# Patient Record
Sex: Male | Born: 1940
Health system: Southern US, Community
[De-identification: ages and names within clinical notes are randomized; demographics above are authoritative.]

## PROBLEM LIST (undated history)

## (undated) DIAGNOSIS — I10 Essential (primary) hypertension: Secondary | ICD-10-CM

## (undated) DIAGNOSIS — K5730 Diverticulosis of large intestine without perforation or abscess without bleeding: Secondary | ICD-10-CM

## (undated) DIAGNOSIS — N400 Enlarged prostate without lower urinary tract symptoms: Secondary | ICD-10-CM

## (undated) DIAGNOSIS — E785 Hyperlipidemia, unspecified: Secondary | ICD-10-CM

## (undated) DIAGNOSIS — T7840XA Allergy, unspecified, initial encounter: Secondary | ICD-10-CM

## (undated) DIAGNOSIS — M1990 Unspecified osteoarthritis, unspecified site: Secondary | ICD-10-CM

## (undated) DIAGNOSIS — I2510 Atherosclerotic heart disease of native coronary artery without angina pectoris: Secondary | ICD-10-CM

## (undated) DIAGNOSIS — H269 Unspecified cataract: Secondary | ICD-10-CM

## (undated) DIAGNOSIS — I252 Old myocardial infarction: Secondary | ICD-10-CM

## (undated) DIAGNOSIS — Z86010 Personal history of colonic polyps: Secondary | ICD-10-CM

## (undated) HISTORY — DX: Old myocardial infarction: I25.2

## (undated) HISTORY — DX: Atherosclerotic heart disease of native coronary artery without angina pectoris: I25.10

## (undated) HISTORY — PX: PTCA: SHX146

## (undated) HISTORY — DX: Personal history of colonic polyps: Z86.010

## (undated) HISTORY — PX: CATARACT EXTRACTION: SUR2

## (undated) HISTORY — PX: EYE SURGERY: SHX253

## (undated) HISTORY — DX: Benign prostatic hyperplasia without lower urinary tract symptoms: N40.0

## (undated) HISTORY — DX: Unspecified cataract: H26.9

## (undated) HISTORY — DX: Allergy, unspecified, initial encounter: T78.40XA

## (undated) HISTORY — DX: Hyperlipidemia, unspecified: E78.5

## (undated) HISTORY — PX: COLONOSCOPY: SHX174

## (undated) HISTORY — DX: Essential (primary) hypertension: I10

## (undated) HISTORY — DX: Diverticulosis of large intestine without perforation or abscess without bleeding: K57.30

---

## 2000-04-12 ENCOUNTER — Encounter: Payer: Self-pay | Admitting: Cardiology

## 2000-04-12 ENCOUNTER — Ambulatory Visit (HOSPITAL_COMMUNITY): Admission: RE | Admit: 2000-04-12 | Discharge: 2000-04-12 | Payer: Self-pay | Admitting: Cardiology

## 2000-07-07 ENCOUNTER — Encounter: Payer: Self-pay | Admitting: Internal Medicine

## 2004-01-26 LAB — HM COLONOSCOPY

## 2004-02-19 ENCOUNTER — Ambulatory Visit: Payer: Self-pay | Admitting: Internal Medicine

## 2004-03-12 ENCOUNTER — Encounter: Payer: Self-pay | Admitting: Internal Medicine

## 2004-03-12 ENCOUNTER — Ambulatory Visit: Payer: Self-pay | Admitting: Internal Medicine

## 2005-08-04 ENCOUNTER — Ambulatory Visit: Payer: Self-pay | Admitting: Internal Medicine

## 2005-08-11 ENCOUNTER — Encounter: Payer: Self-pay | Admitting: Cardiovascular Disease

## 2005-08-11 ENCOUNTER — Ambulatory Visit: Payer: Self-pay

## 2005-08-16 ENCOUNTER — Ambulatory Visit: Payer: Self-pay | Admitting: Cardiology

## 2006-07-20 ENCOUNTER — Ambulatory Visit: Payer: Self-pay | Admitting: Internal Medicine

## 2006-07-20 DIAGNOSIS — I2510 Atherosclerotic heart disease of native coronary artery without angina pectoris: Secondary | ICD-10-CM

## 2006-07-20 DIAGNOSIS — R351 Nocturia: Secondary | ICD-10-CM

## 2006-07-20 DIAGNOSIS — E785 Hyperlipidemia, unspecified: Secondary | ICD-10-CM

## 2006-07-20 DIAGNOSIS — I252 Old myocardial infarction: Secondary | ICD-10-CM

## 2006-07-20 DIAGNOSIS — N401 Enlarged prostate with lower urinary tract symptoms: Secondary | ICD-10-CM

## 2006-07-20 HISTORY — DX: Old myocardial infarction: I25.2

## 2006-07-20 LAB — CONVERTED CEMR LAB
ALT: 38 units/L (ref 0–53)
AST: 47 units/L — ABNORMAL HIGH (ref 0–37)
Albumin: 4.1 g/dL (ref 3.5–5.2)
Alkaline Phosphatase: 46 units/L (ref 39–117)
BUN: 16 mg/dL (ref 6–23)
Basophils Absolute: 0 10*3/uL (ref 0.0–0.1)
Basophils Relative: 0.3 % (ref 0.0–1.0)
Bilirubin, Direct: 0.1 mg/dL (ref 0.0–0.3)
CO2: 32 meq/L (ref 19–32)
Calcium: 9.4 mg/dL (ref 8.4–10.5)
Chloride: 101 meq/L (ref 96–112)
Cholesterol: 109 mg/dL (ref 0–200)
Creatinine, Ser: 1.2 mg/dL (ref 0.4–1.5)
Eosinophils Absolute: 0.3 10*3/uL (ref 0.0–0.6)
Eosinophils Relative: 5.2 % — ABNORMAL HIGH (ref 0.0–5.0)
GFR calc Af Amer: 77 mL/min
GFR calc non Af Amer: 64 mL/min
Glucose, Bld: 94 mg/dL (ref 70–99)
HCT: 46.6 % (ref 39.0–52.0)
HDL: 41.8 mg/dL (ref >39.0)
Hemoglobin: 15.4 g/dL (ref 13.0–17.0)
LDL Cholesterol: 56 mg/dL (ref 0–99)
Lymphocytes Relative: 31.8 % (ref 12.0–46.0)
MCHC: 33 g/dL (ref 30.0–36.0)
MCV: 88.7 fL (ref 78.0–100.0)
Monocytes Absolute: 0.9 10*3/uL — ABNORMAL HIGH (ref 0.2–0.7)
Monocytes Relative: 13.4 % — ABNORMAL HIGH (ref 3.0–11.0)
Neutro Abs: 3.2 10*3/uL (ref 1.4–7.7)
Neutrophils Relative %: 49.3 % (ref 43.0–77.0)
PSA: 3.47 ng/mL (ref 0.10–4.00)
Platelets: 234 10*3/uL (ref 150–400)
Potassium: 4.5 meq/L (ref 3.5–5.1)
RBC: 5.26 M/uL (ref 4.22–5.81)
RDW: 12.5 % (ref 11.5–14.6)
Sodium: 142 meq/L (ref 135–145)
TSH: 1.48 microintl units/mL (ref 0.35–5.50)
Total Bilirubin: 1.3 mg/dL — ABNORMAL HIGH (ref 0.3–1.2)
Total CHOL/HDL Ratio: 2.6
Total Protein: 7.3 g/dL (ref 6.0–8.3)
Triglycerides: 55 mg/dL (ref 0–149)
VLDL: 11 mg/dL (ref 0–40)
WBC: 6.4 10*3/uL (ref 4.5–10.5)

## 2006-08-31 ENCOUNTER — Encounter: Payer: Self-pay | Admitting: Internal Medicine

## 2007-07-25 ENCOUNTER — Ambulatory Visit: Payer: Self-pay | Admitting: Internal Medicine

## 2007-07-25 DIAGNOSIS — Q828 Other specified congenital malformations of skin: Secondary | ICD-10-CM

## 2007-07-25 LAB — CONVERTED CEMR LAB
ALT: 25 units/L (ref 0–53)
AST: 32 units/L (ref 0–37)
Albumin: 4.1 g/dL (ref 3.5–5.2)
Alkaline Phosphatase: 43 units/L (ref 39–117)
BUN: 13 mg/dL (ref 6–23)
Basophils Absolute: 0 10*3/uL (ref 0.0–0.1)
Basophils Relative: 0.5 % (ref 0.0–1.0)
Bilirubin, Direct: 0.1 mg/dL (ref 0.0–0.3)
CO2: 31 meq/L (ref 19–32)
Calcium: 9.4 mg/dL (ref 8.4–10.5)
Chloride: 105 meq/L (ref 96–112)
Cholesterol: 143 mg/dL (ref 0–200)
Creatinine, Ser: 1.1 mg/dL (ref 0.4–1.5)
Eosinophils Absolute: 0.2 10*3/uL (ref 0.0–0.7)
Eosinophils Relative: 3.6 % (ref 0.0–5.0)
GFR calc Af Amer: 85 mL/min
GFR calc non Af Amer: 71 mL/min
Glucose, Bld: 103 mg/dL — ABNORMAL HIGH (ref 70–99)
HCT: 43.8 % (ref 39.0–52.0)
HDL: 57.6 mg/dL (ref >39.0)
Hemoglobin: 15 g/dL (ref 13.0–17.0)
LDL Cholesterol: 71 mg/dL (ref 0–99)
Lymphocytes Relative: 30.4 % (ref 12.0–46.0)
MCHC: 34.4 g/dL (ref 30.0–36.0)
MCV: 89.2 fL (ref 78.0–100.0)
Monocytes Absolute: 0.6 10*3/uL (ref 0.1–1.0)
Monocytes Relative: 9.2 % (ref 3.0–12.0)
Neutro Abs: 3.4 10*3/uL (ref 1.4–7.7)
Neutrophils Relative %: 56.3 % (ref 43.0–77.0)
PSA: 3.17 ng/mL (ref 0.10–4.00)
Platelets: 207 10*3/uL (ref 150–400)
Potassium: 5.4 meq/L — ABNORMAL HIGH (ref 3.5–5.1)
RBC: 4.91 M/uL (ref 4.22–5.81)
RDW: 12.9 % (ref 11.5–14.6)
Sodium: 140 meq/L (ref 135–145)
TSH: 1.55 microintl units/mL (ref 0.35–5.50)
Total Bilirubin: 1.1 mg/dL (ref 0.3–1.2)
Total CHOL/HDL Ratio: 2.5
Total Protein: 7.2 g/dL (ref 6.0–8.3)
Triglycerides: 74 mg/dL (ref 0–149)
VLDL: 15 mg/dL (ref 0–40)
WBC: 6.1 10*3/uL (ref 4.5–10.5)

## 2007-07-31 ENCOUNTER — Encounter: Admission: RE | Admit: 2007-07-31 | Discharge: 2007-09-06 | Payer: Self-pay | Admitting: Internal Medicine

## 2007-07-31 ENCOUNTER — Encounter: Payer: Self-pay | Admitting: Internal Medicine

## 2008-03-05 ENCOUNTER — Telehealth: Payer: Self-pay | Admitting: Internal Medicine

## 2008-04-01 ENCOUNTER — Telehealth: Payer: Self-pay | Admitting: Internal Medicine

## 2008-05-24 ENCOUNTER — Telehealth: Payer: Self-pay | Admitting: Internal Medicine

## 2008-07-24 ENCOUNTER — Ambulatory Visit: Payer: Self-pay | Admitting: Internal Medicine

## 2008-07-24 LAB — CONVERTED CEMR LAB
ALT: 22 units/L (ref 0–53)
AST: 33 units/L (ref 0–37)
Albumin: 3.9 g/dL (ref 3.5–5.2)
Alkaline Phosphatase: 48 units/L (ref 39–117)
BUN: 15 mg/dL (ref 6–23)
Basophils Absolute: 0 10*3/uL (ref 0.0–0.1)
Basophils Relative: 0 % (ref 0.0–3.0)
Bilirubin Urine: NEGATIVE
Bilirubin, Direct: 0.2 mg/dL (ref 0.0–0.3)
CO2: 29 meq/L (ref 19–32)
Calcium: 9 mg/dL (ref 8.4–10.5)
Chloride: 107 meq/L (ref 96–112)
Cholesterol: 133 mg/dL (ref 0–200)
Creatinine, Ser: 1.1 mg/dL (ref 0.4–1.5)
Eosinophils Absolute: 0.2 10*3/uL (ref 0.0–0.7)
Eosinophils Relative: 4.2 % (ref 0.0–5.0)
GFR calc non Af Amer: 70.33 mL/min (ref >60)
Glucose, Bld: 111 mg/dL — ABNORMAL HIGH (ref 70–99)
Glucose, Urine, Semiquant: NEGATIVE
HCT: 41.5 % (ref 39.0–52.0)
HDL: 57.7 mg/dL (ref >39.00)
Hemoglobin: 14.3 g/dL (ref 13.0–17.0)
Ketones, urine, test strip: NEGATIVE
LDL Cholesterol: 64 mg/dL (ref 0–99)
Lymphocytes Relative: 35.8 % (ref 12.0–46.0)
Lymphs Abs: 2 10*3/uL (ref 0.7–4.0)
MCHC: 34.3 g/dL (ref 30.0–36.0)
MCV: 88.1 fL (ref 78.0–100.0)
Monocytes Absolute: 0.6 10*3/uL (ref 0.1–1.0)
Monocytes Relative: 11.3 % (ref 3.0–12.0)
Neutro Abs: 2.9 10*3/uL (ref 1.4–7.7)
Neutrophils Relative %: 48.7 % (ref 43.0–77.0)
Nitrite: NEGATIVE
PSA: 3.22 ng/mL (ref 0.10–4.00)
Platelets: 193 10*3/uL (ref 150.0–400.0)
Potassium: 4.2 meq/L (ref 3.5–5.1)
Protein, U semiquant: NEGATIVE
RBC: 4.71 M/uL (ref 4.22–5.81)
RDW: 12.5 % (ref 11.5–14.6)
Sodium: 141 meq/L (ref 135–145)
Specific Gravity, Urine: 1.015
TSH: 1.68 microintl units/mL (ref 0.35–5.50)
Total Bilirubin: 1.2 mg/dL (ref 0.3–1.2)
Total CHOL/HDL Ratio: 2
Total Protein: 6.9 g/dL (ref 6.0–8.3)
Triglycerides: 57 mg/dL (ref 0.0–149.0)
Urobilinogen, UA: 0.2
VLDL: 11.4 mg/dL (ref 0.0–40.0)
Vit D, 25-Hydroxy: 60 ng/mL (ref 30–89)
WBC Urine, dipstick: NEGATIVE
WBC: 5.7 10*3/uL (ref 4.5–10.5)
pH: 6

## 2008-08-02 ENCOUNTER — Ambulatory Visit: Payer: Self-pay | Admitting: Internal Medicine

## 2008-12-04 ENCOUNTER — Ambulatory Visit: Payer: Self-pay | Admitting: Internal Medicine

## 2009-05-05 ENCOUNTER — Encounter: Payer: Self-pay | Admitting: Internal Medicine

## 2009-07-30 ENCOUNTER — Ambulatory Visit: Payer: Self-pay | Admitting: Internal Medicine

## 2009-07-30 LAB — CONVERTED CEMR LAB
ALT: 23 units/L (ref 0–53)
AST: 30 units/L (ref 0–37)
Albumin: 4.2 g/dL (ref 3.5–5.2)
Alkaline Phosphatase: 48 units/L (ref 39–117)
BUN: 18 mg/dL (ref 6–23)
Basophils Absolute: 0 10*3/uL (ref 0.0–0.1)
Basophils Relative: 0.6 % (ref 0.0–3.0)
Bilirubin Urine: NEGATIVE
Bilirubin, Direct: 0.2 mg/dL (ref 0.0–0.3)
Blood in Urine, dipstick: NEGATIVE
CO2: 29 meq/L (ref 19–32)
Calcium: 9.1 mg/dL (ref 8.4–10.5)
Chloride: 106 meq/L (ref 96–112)
Cholesterol: 162 mg/dL (ref 0–200)
Creatinine, Ser: 1.1 mg/dL (ref 0.4–1.5)
Eosinophils Absolute: 0.3 10*3/uL (ref 0.0–0.7)
Eosinophils Relative: 4.4 % (ref 0.0–5.0)
GFR calc non Af Amer: 73.99 mL/min (ref >60)
Glucose, Bld: 90 mg/dL (ref 70–99)
Glucose, Urine, Semiquant: NEGATIVE
HCT: 43.6 % (ref 39.0–52.0)
HDL: 58.6 mg/dL (ref >39.00)
Hemoglobin: 15 g/dL (ref 13.0–17.0)
Ketones, urine, test strip: NEGATIVE
LDL Cholesterol: 86 mg/dL (ref 0–99)
Lymphocytes Relative: 36.2 % (ref 12.0–46.0)
Lymphs Abs: 2.5 10*3/uL (ref 0.7–4.0)
MCHC: 34.3 g/dL (ref 30.0–36.0)
MCV: 88.5 fL (ref 78.0–100.0)
Monocytes Absolute: 0.7 10*3/uL (ref 0.1–1.0)
Monocytes Relative: 10.4 % (ref 3.0–12.0)
Neutro Abs: 3.3 10*3/uL (ref 1.4–7.7)
Neutrophils Relative %: 48.4 % (ref 43.0–77.0)
Nitrite: NEGATIVE
PSA: 4.09 ng/mL — ABNORMAL HIGH (ref 0.10–4.00)
Platelets: 214 10*3/uL (ref 150.0–400.0)
Potassium: 4.4 meq/L (ref 3.5–5.1)
Protein, U semiquant: NEGATIVE
RBC: 4.93 M/uL (ref 4.22–5.81)
RDW: 13.8 % (ref 11.5–14.6)
Sodium: 141 meq/L (ref 135–145)
Specific Gravity, Urine: 1.02
TSH: 1.75 microintl units/mL (ref 0.35–5.50)
Total Bilirubin: 0.8 mg/dL (ref 0.3–1.2)
Total CHOL/HDL Ratio: 3
Total Protein: 7.1 g/dL (ref 6.0–8.3)
Triglycerides: 89 mg/dL (ref 0.0–149.0)
Urobilinogen, UA: 0.2
VLDL: 17.8 mg/dL (ref 0.0–40.0)
WBC Urine, dipstick: NEGATIVE
WBC: 6.9 10*3/uL (ref 4.5–10.5)
pH: 6.5

## 2009-08-05 ENCOUNTER — Ambulatory Visit: Payer: Self-pay | Admitting: Internal Medicine

## 2009-08-05 DIAGNOSIS — R972 Elevated prostate specific antigen [PSA]: Secondary | ICD-10-CM

## 2009-09-16 ENCOUNTER — Ambulatory Visit: Payer: Self-pay | Admitting: Internal Medicine

## 2009-09-24 LAB — CONVERTED CEMR LAB
PSA, Free Pct: 18 — ABNORMAL LOW (ref >25)
PSA, Free: 0.7 ng/mL
PSA: 3.82 ng/mL (ref 0.10–4.00)

## 2010-01-20 ENCOUNTER — Ambulatory Visit
Admission: RE | Admit: 2010-01-20 | Discharge: 2010-01-20 | Payer: Self-pay | Source: Home / Self Care | Attending: Internal Medicine | Admitting: Internal Medicine

## 2010-02-24 NOTE — Assessment & Plan Note (Signed)
Summary: emp/pt fasting/cjr   Vital Signs:  Patient profile:   69 year old male Height:      73 inches (185.42 cm) Weight:      194 pounds (88.18 kg) BMI:     25.69 Temp:     97.9 degrees F (36.61 degrees C) oral Pulse rate:   72 / minute BP sitting:   164 / 88  (left arm) Cuff size:   regular  Vitals Entered By: Josph Macho RMA (August 05, 2009 8:55 AM) CC: Physical/ CF Is Patient Diabetic? No   CC:  Physical/ CF.  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 Penicillin V Potassium (Penicillin V Potassium)  Past History:  Past Medical History: Last updated: 07/20/2006 Coronary artery disease Hyperlipidemia Myocardial infarction, hx of Benign prostatic hypertrophy Diverticulosis, colon  Past Surgical History: Last updated: 07/25/2007 Percutaneous transluminal coronary angioplasty1998  Family History: Last updated: 07/20/2006 Father and paternal uncle with AAA Father deceased stroke-82 yo-also renal failure Mother-deceased 76 yo CABG age 78yo  Social History: Last updated: 07/20/2006 Retired- Never Smoked Alcohol use-yes Regular exercise-yes-eliptical, weight lifting.   Risk Factors: Alcohol Use: <1 (07/20/2006) Exercise: yes (07/20/2006)  Risk Factors: Smoking Status: never (07/20/2006)  Review of Systems       All other systems reviewed and were negative    Impression & Recommendations:  Problem # 1:  HEALTH SCREENING (ICD-V70.0) health maint  utd encouraged continued physical activity  Complete Medication List: 1)  Simvastatin 40 Mg Tabs (Simvastatin) .... Take 1/2 tablet by mouth at bedtime 2)  Aspirin 81 Mg Tbec (Aspirin) .... Once daily 3)  Multivitamins Tabs (Multiple vitamin) .... Once daily 4)  Saw Palmetto 450 Mg Caps (Saw palmetto (serenoa repens)) .... Two daily 5)  Vitamin D3 2000 Unit Caps (Cholecalciferol) .... Two times a day 6)  Move Free  .... Once daily 7)  Nitroglycerin 0.3 Mg Subl (Nitroglycerin) .Marland Kitchen.. 1 sublingual q as needed 8)  Viagra 100 Mg Tabs (Sildenafil citrate) .... 1/2-1 by mouth 1 hour- 4 hours prior to intercourse , do not take nitroglycerin 9)  Eql Coq10 300 Mg Caps (Coenzyme q10) .... Once daily 10)  Reseratrol 250 Mg  .... Daily 11)  Fish Oil 1000 Mg Caps (Omega-3 fatty acids) .Marland Kitchen.. 1 daily  Patient Instructions: 1)  PSA and free psa in 6 weeks 790.93 2)  if we don't call you with results call us back after 4-5 days Physical Exam General Appearance: well developed, well nourished, no acute distress Eyes: conjunctiva and lids normal, PERRLA, EOMI, Ears, Nose, Mouth, Throat: TM clear, nares clear, oral exam WNL Neck: supple, no lymphadenopathy, no thyromegaly, no JVD Respiratory: clear to auscultation and percussion, respiratory effort normal Cardiovascular: regular rate and rhythm, S1-S2, no murmur, rub or gallop, no bruits, peripheral pulses normal and symmetric, no cyanosis, clubbing, edema or varicosities Chest: no scars, masses, tenderness; no asymmetry, skin changes, nipple discharge, no gynecomastia   Gastrointestinal: soft, non-tender; no hepatosplenomegaly, masses; active bowel sounds all quadrants,  Genitourinary: minimal  prostate enlargement Lymphatic:  no cervical, axillary or inguinal adenopathy Musculoskeletal: gait normal, muscle tone and strength WNL, no joint swelling, effusions, discoloration, crepitus  Skin: clear, good turgor, color WNL, no rashes, lesions, or  ulcerations Neurologic: normal mental status, normal reflexes, normal strength, sensation, and motion Psychiatric: alert; oriented to person, place and time Other Exam:

## 2010-02-24 NOTE — Letter (Signed)
Summary: Medical Clearance for Exercise Program  Medical Clearance for Exercise Program   Imported By: Maryln Gottron 05/06/2009 15:40:57  _____________________________________________________________________  External Attachment:    Type:   Image     Comment:   External Document

## 2010-02-26 NOTE — Assessment & Plan Note (Signed)
Summary: FLU SHOT//[facility]  Nurse Visit   Allergies: 1)  ! Penicillin V Potassium (Penicillin V Potassium)  Orders Added: 1)  Admin 1st Vaccine [90471] 2)  Flu Vaccine 65yrs + [62952] Flu Vaccine Consent Questions     Do you have a history of severe allergic reactions to this vaccine? no    Any prior history of allergic reactions to egg and/or gelatin? no    Do you have a sensitivity to the preservative Thimersol? no    Do you have a past history of Guillan-Barre Syndrome? no    Do you currently have an acute febrile illness? no    Have you ever had a severe reaction to latex? no    Vaccine information given and explained to patient? yes    Are you currently pregnant? no    Lot Number:[id number]   Exp Date:07/25/2010   Site Given  Left Deltoid IM-CCC]  .[email or web address]

## 2010-03-25 ENCOUNTER — Other Ambulatory Visit: Payer: PRIVATE HEALTH INSURANCE | Admitting: Internal Medicine

## 2010-03-25 ENCOUNTER — Other Ambulatory Visit: Payer: Self-pay | Admitting: Internal Medicine

## 2010-03-25 DIAGNOSIS — R972 Elevated prostate specific antigen [PSA]: Secondary | ICD-10-CM

## 2010-03-25 LAB — PSA, TOTAL AND FREE
PSA, Free Pct: 18 % — ABNORMAL LOW (ref >25)
PSA, Free: 0.7 ng/mL

## 2010-05-08 ENCOUNTER — Other Ambulatory Visit: Payer: Self-pay | Admitting: Internal Medicine

## 2010-06-12 NOTE — Assessment & Plan Note (Signed)
[facility]                              CARDIOLOGY OFFICE NOTE   NAME:Collin [name], Collin [name]                     MRN:          [id number]  DATE:08/04/2005                            DOB:          08/15/1940    SUBJECTIVE:  Mr. Kohlmeyer is a very pleasant 70 year old male patient  previously followed by Dr. Charlies Constable who was last seen in our office in  03/2000.  The patient has a history of inferior wall myocardial infarction  in 1998, treated with angioplasty to the RCA.  At that time his residual  disease included a 70% mid LAD and 80% stenosis in the diagonal branch of  the LAD, and he had slight inferobasal wall hypokinesis.  His EF was 60% at  that time.  The left circumflex was free of significant disease.  The  patient did have a follow-up Cardiolite study done on 07/14/1998 that showed  inferobasal scar and an EF of 54%.  He has been followed by Dr. Doran Clay at [facility] since that time.  He  has been doing well up until a week and a half to two weeks ago when he  noted some discomfort in his left chest.  This was at rest while he was  laying in bed.  It did not wake him up.  He describes it as a beginning,  quiver.  This was brief in nature and would come and go for about an hour.  The discomfort went away on its own.  Nothing made it any worse.  He has had  not had a recurrence of this symptom since then.  He is quite active.  He  works on the [employer] three times a week and walks three times a  week as well.  He has been doing his normal activity since this episode of  discomfort without symptoms.  He denies any shortness of breath, nausea,  diaphoresis, orthopnea, paroxysmal nocturnal dyspnea, syncope, presyncope.   CURRENT MEDICATIONS:  Include multivitamin, Vytorin 10/40 mg q.h.s., aspirin  81 mg daily, Glucosamine, and saw palmetto.   ALLERGIES:  NO KNOWN DRUG ALLERGIES.   SOCIAL HISTORY:   The patient denies any current tobacco abuse or alcohol  abuse.   FAMILY HISTORY:  Significant for coronary disease.  His mother had bypass in  her mid-to-late 79s.   REVIEW OF SYSTEMS:  Please see HPI.  He denies any fevers, chills, cough,  melena, hematochezia, hematuria, dysuria, claudication, amaurosis fugax , or  TIAs.  Rest of review of systems is negative.   PHYSICAL EXAMINATION:  He is a well-nourished, well-developed male.  Blood  pressure 140/88.  Pulse 64.  Weight 191 pounds.  Repeat blood pressure by me  is 132/80.  HEAD:  Normocephalic, atraumatic.  EYES:  PERRLA, EOMI.  Sclerae are clear.  OROPHARYNX:  Pink without exudate.  NECK:  Without lymphadenopathy. Without thyromegaly. Carotids are 2+  bilaterally without bruits.  CARDIAC:  Normal S1 and S2, regular rate and rhythm, without murmurs,  clicks, rubs, or gallops.  LUNGS:  Clear to auscultation  without wheezing, rhonchi, or rales.  ABDOMEN:  Soft and nontender with normoactive bowel sounds.  No  organomegaly.  EXTREMITIES:  Without edema.  Calves are soft and nontender.  Femoral artery  pulses are 2+ bilaterally without bruits.  Dorsalis pedis and posterior  tibialis pulses are 2+ bilaterally.  NEUROLOGIC:  He is alert and oriented x3.  Cranial nerves II through XII are  grossly intact.  No focal deficits.  SKIN:  Warm and dry.  ELECTROCARDIOGRAM reveals a sinus rhythm with a heart rate of 64.  Normal  axis.  No acute changes.   IMPRESSION:  1.  Chest pain.  2.  Coronary artery disease.      1.  Status post inferior wall myocardial infarction in 05/1996 treated          with balloon angioplasty.      2.  Residual disease as noted: 70% mid LAD and 80% diagonal.      3.  Nonischemic Cardiolite in 06/1998.  3.  Preserved LAD function.  4.  Treated dyslipidemia.  5.  Family history of coronary disease.   PLAN:  The patient has had a singular episode of atypical chest discomfort.  He is quite active for a  70 year old and has no symptoms of shortness of  breath or chest pain with his normal daily activities.  His EKG is  nonischemic at this point in time.  His recent lab work shows a good  cholesterol level with an LDL of 51 and an HDL of 59.  These findings were  done by Dr. Janey Greaser in 06/2005.  I have discussed the patient's care today  with Dr. Dietrich Pates.  We plan to proceed with noninvasive testing with a  stress Myoview study to further define his recent chest pain.  His  medications will continue the same at this point in time.  He has been  encouraged to stay on an aspirin.  He is a little concerned about his blood  pressure and we could consider adding a low-dose beta-blocker.  However, we  will wait until his Myoview study is completed.  Should he have recurrence  of symptoms or an abnormal Myoview study, then we will certainly need to  proceed with cardiac catheterization.  The patient will follow up with Dr.  Juanda Chance in the next two weeks.                                  Tereso Newcomer, PA-C    SW/MedQ  DD:  08/04/2005  DT:  08/04/2005  Job #:  [id number]   cc:   Al Decant. Janey Greaser, MD

## 2010-06-12 NOTE — Assessment & Plan Note (Signed)
[facility]                              CARDIOLOGY OFFICE NOTE   NAME:SAMPLETashon, Capp                     MRN:          [id number]  DATE:08/16/2005                            DOB:          15-Jul-1940    PRIMARY CARE PHYSICIAN:  Al Decant. Janey Greaser, M.D.   CLINICAL HISTORY:  Mr. Golembeski is 70 years old and returned for a followup  visit after his recent Myoview scan.  He had a diaphragmatic wall  infarction, treated with PTCA of the right coronary artery in 1998 (no  stent) and had residual 70% narrowing in the LAD.  He recently was seen by  Tereso Newcomer with symptoms of sharp, intermittent left chest pain.  The  symptoms were thought to be atypical, but he was evaluated with a Myoview  scan, which was negative for ischemia and showed an ejection fraction of  56%.   He has had no recurrence of his symptoms.   PAST MEDICAL HISTORY:  His past medical history is significant for  hyperlipidemia, but he had a recent lipid panel by Dr. Janey Greaser, which showed  an HDL of 59 and an LDL of 51 and a total cholesterol of 121.   CURRENT MEDICATIONS INCLUDE:  1.  Vytorin 1040.  2.  Aspirin.  3.  Glucosamine.   PHYSICAL EXAMINATION:  On examination today, the blood pressure was 129/74  and the pulse 56 and regular.  There was no venous distention.  Carotid  pulses were full, without bruits.  Chest was clear.  Cardiac rhythm was  regular.  There were no murmurs or gallops.  The abdomen was soft, without  organomegaly.  Peripheral pulses were full.  There was no peripheral edema.   IMPRESSION:  1.  Coronary artery disease, status post inferior wall myocardial infarction      in 1998, treated with PTCA with residual 70% narrowing of the LAD.  2.  Recent chest pain, felt to be non-ischemic with negative Cardiolite      scan.  3.  Good LV function.  4.  Treated for hyperlipidemia.  5.  Positive family history for coronary heart disease.   RECOMMENDATIONS:   Mr. Saiki recent symptoms were quite atypical and the  scan is negative, so I do not think his symptoms were cardiac in etiology.  His risk factors are under excellent control.  He is at a good body weight  and exercises regularly and his lipid panel is excellent and his blood  pressure is under good control.  I reassured him that I thought his heart  was in good shape and that he was doing an excellent job in modifying his  risk profile.  He will plan to see Dr. Janey Greaser in followup and we will see  him back here on a p.r.n. basis.                               Bruce Elvera Lennox Juanda Chance, MD, [facility]    BRB/MedQ  DD:  08/16/2005  DT:  08/16/2005  Job #:  [id number] 

## 2010-07-08 ENCOUNTER — Encounter: Payer: PRIVATE HEALTH INSURANCE | Admitting: Internal Medicine

## 2010-07-31 ENCOUNTER — Other Ambulatory Visit: Payer: PRIVATE HEALTH INSURANCE

## 2010-08-04 ENCOUNTER — Other Ambulatory Visit (INDEPENDENT_AMBULATORY_CARE_PROVIDER_SITE_OTHER): Payer: PRIVATE HEALTH INSURANCE

## 2010-08-04 DIAGNOSIS — E785 Hyperlipidemia, unspecified: Secondary | ICD-10-CM

## 2010-08-04 DIAGNOSIS — R972 Elevated prostate specific antigen [PSA]: Secondary | ICD-10-CM

## 2010-08-04 DIAGNOSIS — Z0000 Encounter for general adult medical examination without abnormal findings: Secondary | ICD-10-CM

## 2010-08-04 LAB — CBC WITH DIFFERENTIAL/PLATELET
Basophils Relative: 0.5 % (ref 0.0–3.0)
Eosinophils Relative: 4 % (ref 0.0–5.0)
Lymphocytes Relative: 37.7 % (ref 12.0–46.0)
MCV: 89.8 fl (ref 78.0–100.0)
Monocytes Absolute: 0.7 10*3/uL (ref 0.1–1.0)
Neutrophils Relative %: 47 % (ref 43.0–77.0)
RBC: 4.96 Mil/uL (ref 4.22–5.81)
WBC: 6.7 10*3/uL (ref 4.5–10.5)

## 2010-08-04 LAB — HEPATIC FUNCTION PANEL
ALT: 30 U/L (ref 0–53)
Alkaline Phosphatase: 54 U/L (ref 39–117)
Bilirubin, Direct: 0.2 mg/dL (ref 0.0–0.3)
Total Protein: 6.9 g/dL (ref 6.0–8.3)

## 2010-08-04 LAB — POCT URINALYSIS DIPSTICK
Blood, UA: NEGATIVE
Glucose, UA: NEGATIVE
Ketones, UA: NEGATIVE
Protein, UA: NEGATIVE
Spec Grav, UA: 1.015

## 2010-08-04 LAB — BASIC METABOLIC PANEL
Chloride: 105 mEq/L (ref 96–112)
Creatinine, Ser: 1.1 mg/dL (ref 0.4–1.5)
Sodium: 139 mEq/L (ref 135–145)

## 2010-08-04 LAB — PSA: PSA: 5.39 ng/mL — ABNORMAL HIGH (ref 0.10–4.00)

## 2010-08-04 LAB — TSH: TSH: 1.43 u[IU]/mL (ref 0.35–5.50)

## 2010-08-04 LAB — LIPID PANEL: Total CHOL/HDL Ratio: 3

## 2010-08-07 ENCOUNTER — Encounter: Payer: PRIVATE HEALTH INSURANCE | Admitting: Internal Medicine

## 2010-08-11 ENCOUNTER — Other Ambulatory Visit: Payer: Self-pay | Admitting: Internal Medicine

## 2010-08-11 DIAGNOSIS — E875 Hyperkalemia: Secondary | ICD-10-CM

## 2010-08-11 MED ORDER — CIPROFLOXACIN HCL 500 MG PO TABS: 500.0000 mg | ORAL_TABLET | Freq: Two times a day (BID) | ORAL | Status: AC

## 2010-08-12 ENCOUNTER — Other Ambulatory Visit (INDEPENDENT_AMBULATORY_CARE_PROVIDER_SITE_OTHER): Payer: PRIVATE HEALTH INSURANCE

## 2010-08-12 ENCOUNTER — Telehealth: Payer: Self-pay

## 2010-08-12 DIAGNOSIS — E875 Hyperkalemia: Secondary | ICD-10-CM

## 2010-08-12 LAB — BASIC METABOLIC PANEL
CO2: 31 mEq/L (ref 19–32)
Chloride: 105 mEq/L (ref 96–112)
Potassium: 4.4 mEq/L (ref 3.5–5.1)

## 2010-08-12 NOTE — Telephone Encounter (Signed)
Pt requesting copy of labs and PSA done in February.  Pt notified labs mailed

## 2010-09-04 ENCOUNTER — Other Ambulatory Visit (INDEPENDENT_AMBULATORY_CARE_PROVIDER_SITE_OTHER): Payer: PRIVATE HEALTH INSURANCE

## 2010-09-04 ENCOUNTER — Other Ambulatory Visit: Payer: PRIVATE HEALTH INSURANCE

## 2010-09-04 DIAGNOSIS — R972 Elevated prostate specific antigen [PSA]: Secondary | ICD-10-CM

## 2010-09-05 LAB — PSA, TOTAL AND FREE
PSA, Free: 0.78 ng/mL
PSA: 3.99 ng/mL (ref <=4.00)

## 2010-09-08 ENCOUNTER — Encounter: Payer: Self-pay | Admitting: Internal Medicine

## 2010-09-09 ENCOUNTER — Ambulatory Visit (INDEPENDENT_AMBULATORY_CARE_PROVIDER_SITE_OTHER): Payer: PRIVATE HEALTH INSURANCE | Admitting: Internal Medicine

## 2010-09-09 ENCOUNTER — Encounter: Payer: Self-pay | Admitting: Internal Medicine

## 2010-09-09 VITALS — BP 132/84 | HR 72 | Temp 98.3°F | Ht 73.25 in | Wt 193.0 lb

## 2010-09-09 DIAGNOSIS — Z23 Encounter for immunization: Secondary | ICD-10-CM

## 2010-09-09 DIAGNOSIS — Z0000 Encounter for general adult medical examination without abnormal findings: Secondary | ICD-10-CM

## 2010-09-09 NOTE — Progress Notes (Signed)
  Subjective:    Patient ID: Collin [name], male    DOB: March 08, 1940, 70 y.o.   MRN: [id number]  HPI  cpx  Poison ivy---x one week  Left side butock pain--last week, much better.  Occasional nocturnal leg cramps  Past Medical History  Diagnosis Date  . CAD (coronary artery disease)   . Hyperlipidemia   . Myocardial infarction   . BPH (benign prostatic hyperplasia)   . Diverticulosis of colon    Past Surgical History  Procedure Date  . Ptca     reports that he has never smoked. He does not have any smokeless tobacco history on file. He reports that he drinks about .6 ounces of alcohol per week. His drug history not on file. family history includes Aneurysm in his father and paternal uncle; Heart disease in his mother; Kidney disease in his father; and Stroke in his father. Allergies  Allergen Reactions  . Penicillins     REACTION: rash    Review of Systems  patient denies chest pain, shortness of breath, orthopnea. Denies lower extremity edema, abdominal pain, change in appetite, change in bowel movements. Patient denies rashes, musculoskeletal complaints. No other specific complaints in a complete review of systems.      Objective:   Physical Exam Well-developed male in no acute distress. HEENT exam atraumatic, normocephalic, extraocular muscles are intact. Conjunctivae are pink without exudate. Neck is supple without lymphadenopathy, thyromegaly, jugular venous distention. Chest is clear to auscultation without increased work of breathing. Cardiac exam S1-S2 are regular. The PMI is normal. No significant murmurs or gallops. Abdominal exam active bowel sounds, soft, nontender. No abdominal bruits. Extremities no clubbing cyanosis or edema. Peripheral pulses are normal without bruits. Neurologic exam alert and oriented without any motor or sensory deficits. Rectal exam normal tone prostate normal size without masses or asymmetry.        Assessment & Plan:  Well visit.  Health maintenance up-to-date.

## 2010-10-13 ENCOUNTER — Other Ambulatory Visit: Payer: Self-pay | Admitting: Internal Medicine

## 2010-11-16 ENCOUNTER — Other Ambulatory Visit: Payer: Self-pay | Admitting: Internal Medicine

## 2011-01-15 ENCOUNTER — Other Ambulatory Visit: Payer: Self-pay | Admitting: Internal Medicine

## 2011-04-18 ENCOUNTER — Other Ambulatory Visit: Payer: Self-pay | Admitting: Internal Medicine

## 2011-07-19 ENCOUNTER — Other Ambulatory Visit: Payer: Self-pay | Admitting: Internal Medicine

## 2011-09-03 ENCOUNTER — Other Ambulatory Visit (INDEPENDENT_AMBULATORY_CARE_PROVIDER_SITE_OTHER): Payer: PRIVATE HEALTH INSURANCE

## 2011-09-03 DIAGNOSIS — Z79899 Other long term (current) drug therapy: Secondary | ICD-10-CM

## 2011-09-03 DIAGNOSIS — Z125 Encounter for screening for malignant neoplasm of prostate: Secondary | ICD-10-CM

## 2011-09-03 DIAGNOSIS — Z0000 Encounter for general adult medical examination without abnormal findings: Secondary | ICD-10-CM

## 2011-09-03 LAB — POCT URINALYSIS DIPSTICK
Bilirubin, UA: NEGATIVE
Blood, UA: NEGATIVE
Glucose, UA: NEGATIVE
Leukocytes, UA: NEGATIVE
Nitrite, UA: NEGATIVE

## 2011-09-03 LAB — CBC WITH DIFFERENTIAL/PLATELET
Basophils Relative: 0.2 % (ref 0.0–3.0)
Eosinophils Relative: 4 % (ref 0.0–5.0)
HCT: 45.7 % (ref 39.0–52.0)
Hemoglobin: 15.2 g/dL (ref 13.0–17.0)
Lymphs Abs: 2.1 10*3/uL (ref 0.7–4.0)
MCV: 88 fl (ref 78.0–100.0)
Monocytes Absolute: 0.6 10*3/uL (ref 0.1–1.0)
Neutro Abs: 2.8 10*3/uL (ref 1.4–7.7)
Neutrophils Relative %: 48.2 % (ref 43.0–77.0)
RBC: 5.2 Mil/uL (ref 4.22–5.81)
WBC: 5.9 10*3/uL (ref 4.5–10.5)

## 2011-09-03 LAB — HEPATIC FUNCTION PANEL
ALT: 26 U/L (ref 0–53)
Bilirubin, Direct: 0.2 mg/dL (ref 0.0–0.3)
Total Protein: 6.9 g/dL (ref 6.0–8.3)

## 2011-09-03 LAB — BASIC METABOLIC PANEL
Chloride: 100 mEq/L (ref 96–112)
Potassium: 4.6 mEq/L (ref 3.5–5.1)
Sodium: 135 mEq/L (ref 135–145)

## 2011-09-03 LAB — LIPID PANEL
Total CHOL/HDL Ratio: 2
Triglycerides: 61 mg/dL (ref 0.0–149.0)

## 2011-09-03 LAB — TSH: TSH: 1.68 u[IU]/mL (ref 0.35–5.50)

## 2011-09-10 ENCOUNTER — Ambulatory Visit (INDEPENDENT_AMBULATORY_CARE_PROVIDER_SITE_OTHER): Payer: PRIVATE HEALTH INSURANCE | Admitting: Internal Medicine

## 2011-09-10 ENCOUNTER — Encounter: Payer: Self-pay | Admitting: Internal Medicine

## 2011-09-10 VITALS — BP 142/84 | HR 60 | Temp 97.9°F | Ht 74.0 in | Wt 194.0 lb

## 2011-09-10 DIAGNOSIS — Z0000 Encounter for general adult medical examination without abnormal findings: Secondary | ICD-10-CM

## 2011-09-10 DIAGNOSIS — K4090 Unilateral inguinal hernia, without obstruction or gangrene, not specified as recurrent: Secondary | ICD-10-CM | POA: Insufficient documentation

## 2011-09-10 DIAGNOSIS — N400 Enlarged prostate without lower urinary tract symptoms: Secondary | ICD-10-CM

## 2011-09-10 DIAGNOSIS — I2510 Atherosclerotic heart disease of native coronary artery without angina pectoris: Secondary | ICD-10-CM

## 2011-09-10 MED ORDER — TAMSULOSIN HCL 0.4 MG PO CAPS: 0.4000 mg | ORAL_CAPSULE | Freq: Every day | ORAL | Status: DC

## 2011-09-10 NOTE — Assessment & Plan Note (Signed)
Persistent/worsening nocturia Trial flomax

## 2011-09-10 NOTE — Progress Notes (Signed)
Patient ID: Collin [name], male   DOB: Oct 19, 1940, 71 y.o.   MRN: [id number] CPX  Left leg tingling at times-"like it's falling asleep"  Some cramping of left leg at night  Says he may have abdominal hernia for several years  Nocturia x 4  Chest discomfort- states with exertion on treadmill he develops chest discomfort- resolves with rest.   Past Medical History  Diagnosis Date  . CAD (coronary artery disease)   . Hyperlipidemia   . Myocardial infarction   . BPH (benign prostatic hyperplasia)   . Diverticulosis of colon     History   Social History  . Marital Status: Married    Spouse Name: N/A    Number of Children: N/A  . Years of Education: N/A   Occupational History  . Not on file.   Social History Main Topics  . Smoking status: Never Smoker   . Smokeless tobacco: Not on file  . Alcohol Use: 0.6 oz/week    1 Glasses of wine per week  . Drug Use:   . Sexually Active:    Other Topics Concern  . Not on file   Social History Narrative  . No narrative on file    Past Surgical History  Procedure Date  . Ptca     Family History  Problem Relation Age of Onset  . Heart disease Mother     CABG  . Aneurysm Father     AAA  . Stroke Father   . Kidney disease Father     renal failure  . Aneurysm Paternal Uncle     AAA    Allergies  Allergen Reactions  . Penicillins     REACTION: rash    Current Outpatient Prescriptions on File Prior to Visit  Medication Sig Dispense Refill  . aspirin 81 MG tablet Take 81 mg by mouth daily.        . Cholecalciferol (VITAMIN D) 2000 UNITS tablet Take 2,000 Units by mouth daily.        Marland Kitchen co-enzyme Q-10 30 MG capsule Take 30 mg by mouth daily.        . fish oil-omega-3 fatty acids 1000 MG capsule Take 2 g by mouth daily.        . Glucosamine-Chondroitin (MOVE FREE PO) Take 1 tablet by mouth daily.        . Multiple Vitamin (MULTIVITAMIN) tablet Take 1 tablet by mouth daily.        Marland Kitchen NITROSTAT 0.3 MG SL tablet PLACE  1 TABLET UNDER THE TONGUE EVERY 5 MINUTES IF NEEDED  100 tablet  0  . Resveratrol 250 MG CAPS Take 1 capsule by mouth daily.        . Saw Palmetto, Serenoa repens, 450 MG CAPS Take 1 capsule by mouth 2 (two) times daily.        . simvastatin (ZOCOR) 40 MG tablet take 1 tablet by mouth at bedtime  90 tablet  0     patient denies chest pain, shortness of breath, orthopnea. Denies lower extremity edema, abdominal pain, change in appetite, change in bowel movements. Patient denies rashes, musculoskeletal complaints. No other specific complaints in a complete review of systems.   BP 142/84  Pulse 60  Temp 97.9 F (36.6 C) (Oral)  Ht 6\' 2"  (1.88 m)  Wt 194 lb (87.998 kg)  BMI 24.91 kg/m2  well-developed well-nourished male in no acute distress. HEENT exam atraumatic, normocephalic, neck supple without jugular venous distention. Chest clear to auscultation cardiac  exam S1-S2 are regular. Abdominal exam overweight with bowel sounds, soft and nontender. Extremities no edema. Neurologic exam is alert with a normal gait. Left inguinal hernia Well Visit: heatlh maint UTD

## 2011-09-10 NOTE — Assessment & Plan Note (Signed)
Now with exertional angina stresss myoview

## 2011-09-20 ENCOUNTER — Ambulatory Visit (HOSPITAL_COMMUNITY): Payer: PRIVATE HEALTH INSURANCE | Attending: Internal Medicine | Admitting: Radiology

## 2011-09-20 VITALS — BP 139/85 | Ht 74.0 in | Wt 192.0 lb

## 2011-09-20 DIAGNOSIS — I2510 Atherosclerotic heart disease of native coronary artery without angina pectoris: Secondary | ICD-10-CM

## 2011-09-20 DIAGNOSIS — Z8249 Family history of ischemic heart disease and other diseases of the circulatory system: Secondary | ICD-10-CM | POA: Insufficient documentation

## 2011-09-20 DIAGNOSIS — I999 Unspecified disorder of circulatory system: Secondary | ICD-10-CM | POA: Insufficient documentation

## 2011-09-20 DIAGNOSIS — R079 Chest pain, unspecified: Secondary | ICD-10-CM | POA: Insufficient documentation

## 2011-09-20 MED ORDER — TECHNETIUM TC 99M TETROFOSMIN IV KIT
11.0000 | PACK | Freq: Once | INTRAVENOUS | Status: AC | PRN
  Administered 2011-09-20: 11 via INTRAVENOUS

## 2011-09-20 MED ORDER — TECHNETIUM TC 99M TETROFOSMIN IV KIT
33.0000 | PACK | Freq: Once | INTRAVENOUS | Status: AC | PRN
  Administered 2011-09-20: 33 via INTRAVENOUS

## 2011-09-20 NOTE — Progress Notes (Addendum)
[facility] SITE 3 NUCLEAR MED [address] [place] [address] [phone]  Cardiology Nuclear Med Collin [name] is a 71 y.o. male     MRN : [id number]     DOB: April 23, 1940  Procedure Date: 09/20/2011  Nuclear Med Background Indication for Stress Test:  Evaluation for Ischemia and PTCA Patency History:  '98 MI-Heart Cath: Residual 70% LAD Angioplasty RCA '07  Cardiac Risk Factors: Family History - CAD and Lipids  Symptoms:  Chest Pain   Nuclear Pre-Procedure Caffeine/Decaff Intake:  None NPO After: 8:30pm   Lungs:  clear O2 Sat: 97% on room air. IV 0.9% NS with Angio Cath:  20g  IV Site: L Antecubital  IV Started by:  Stanton Kidney, EMT-P  Chest Size (in):  42 Cup Size: n/a  Height: 6\' 2"  (1.88 m)  Weight:  192 lb (87.091 kg)  BMI:  Body mass index is 24.65 kg/(m^2). Tech Comments:  NA    Nuclear Med Study 1 or 2 day study: 1 day  Stress Test Type:  Stress  Reading MD: Dietrich Pates, MD  Order Authorizing Provider:  B.Swords  Resting Radionuclide: Technetium 21m Tetrofosmin  Resting Radionuclide Dose: 11.0 mCi   Stress Radionuclide:  Technetium 46m Tetrofosmin  Stress Radionuclide Dose: 33.0 mCi           Stress Protocol Rest HR: 53 Stress HR: 129  Rest BP: 139/85 Stress BP: 186/74  Exercise Time (min): 10:01 METS: 11.70   Predicted Max HR: 147 bpm % Max HR: 87.76 bpm Rate Pressure Product: 21308   Dose of Adenosine (mg):  n/a Dose of Lexiscan: n/a mg  Dose of Atropine (mg): n/a Dose of Dobutamine: n/a mcg/kg/min (at max HR)  Stress Test Technologist: Milana Na, EMT-P  Nuclear Technologist:  Domenic Polite, CNMT     Rest Procedure:  Myocardial perfusion imaging was performed at rest 45 minutes following the intravenous administration of Technetium 37m Tetrofosmin. Rest ECG: Sinus Bradycardia with 1st degree avb  Stress Procedure:  The patient performed treadmill exercise using a Bruce  Protocol for 10:01 minutes. The patient  stopped due to fatigue and denied any chest pain.  There were non specific ST-T wave changes and a rare pvc with exercise.  Technetium 45m Tetrofosmin was injected at peak exercise and myocardial perfusion imaging was performed after a brief delay. Stress ECG: No significant change from baseline ECG  QPS Raw Data Images:  Images were motion corrected.  SOft tissue (diaphragm, bowel activity) underlies heart. Stress Images:  Mild thinning in the apical anterior/ anteroseptal wall.  Oterhwise normal perfusion. Rest Images:  Normal perfusion in all regions. Subtraction (SDS):  Mild ischemia in the mid anterior/anteroseptal wall.   Transient Ischemic Dilatation (Normal <1.22):  0.99 Lung/Heart Ratio (Normal <0.45):  0.20  Quantitative Gated Spect Images QGS EDV:  133 ml QGS ESV:  59 ml  Impression Exercise Capacity:  Excellent exercise capacity. BP Response:  Normal blood pressure response. Clinical Symptoms:  No significant symptoms noted. ECG Impression:  No significant ST segment change suggestive of ischemia. Comparison with Prior Nuclear Study: Normal perfusion in 2007  Overall Impression:  Clinically and electrically negative for ischemia.  Excellent exercise tolerance.  Myoview scan with a small region of  mild ischemia in the apical anterior/anteroseptal wall.    LV Ejection Fraction: 56%.  LV Wall Motion:  NL LV Function; NL Wall Motion  Dietrich Pates

## 2011-09-28 ENCOUNTER — Encounter: Payer: Self-pay | Admitting: Cardiovascular Disease

## 2011-09-28 ENCOUNTER — Ambulatory Visit (INDEPENDENT_AMBULATORY_CARE_PROVIDER_SITE_OTHER): Payer: PRIVATE HEALTH INSURANCE | Admitting: Cardiovascular Disease

## 2011-09-28 VITALS — BP 136/86 | HR 68 | Ht 74.0 in | Wt 191.0 lb

## 2011-09-28 DIAGNOSIS — E785 Hyperlipidemia, unspecified: Secondary | ICD-10-CM

## 2011-09-28 DIAGNOSIS — R9439 Abnormal result of other cardiovascular function study: Secondary | ICD-10-CM

## 2011-09-28 DIAGNOSIS — I209 Angina pectoris, unspecified: Secondary | ICD-10-CM

## 2011-09-28 DIAGNOSIS — R931 Abnormal findings on diagnostic imaging of heart and coronary circulation: Secondary | ICD-10-CM

## 2011-09-28 DIAGNOSIS — I208 Other forms of angina pectoris: Secondary | ICD-10-CM | POA: Insufficient documentation

## 2011-09-28 MED ORDER — METOPROLOL SUCCINATE ER 25 MG PO TB24: 25.0000 mg | ORAL_TABLET | Freq: Every day | ORAL | Status: DC

## 2011-09-28 MED ORDER — ISOSORBIDE MONONITRATE ER 30 MG PO TB24: 30.0000 mg | ORAL_TABLET | Freq: Every day | ORAL | Status: DC

## 2011-09-28 NOTE — Assessment & Plan Note (Signed)
The patient has new-onset exertional angina. He has known coronary artery disease. His nuclear images were reviewed and this is consistent with anterior wall ischemia. We discussed all the issues around coronary artery disease and exertional angina, specifically considerations for medical therapy versus cardiac catheterization. Considering his high activity level and new onset symptoms with known LAD disease, I have recommended cardiac catheterization and possible PCI. I have reviewed the risks, indications, and alternatives with the patient. Risks include but are not limited to vascular injury, stroke, myocardial infarction, and death. These risks are low and the patient understands this. He will continue on aspirin for antiplatelet therapy. He will start metoprolol succinate 25 mg daily. I also recommended long-acting isosorbide but he declined starting a second drug. We will review this when he comes in for his cardiac catheterization.

## 2011-09-28 NOTE — Patient Instructions (Addendum)
Your physician has requested that you have a cardiac catheterization. Cardiac catheterization is used to diagnose and/or treat various heart conditions. Doctors may recommend this procedure for a number of different reasons. The most common reason is to evaluate chest pain. Chest pain can be a symptom of coronary artery disease (CAD), and cardiac catheterization can show whether plaque is narrowing or blocking your heart's arteries. This procedure is also used to evaluate the valves, as well as measure the blood flow and oxygen levels in different parts of your heart. For further information please visit [email or web address] Please follow instruction sheet, as given.  Your physician recommends that you schedule a follow-up appointment in: 6 WEEKS with Dr Excell Seltzer  Your physician has recommended you make the following change in your medication: START Isosorbide MN 30mg  take one by mouth daily (Pt will hold off on starting this medication at this time), START Metoprolol Succinate ER 25mg  take one by mouth daily

## 2011-09-28 NOTE — Progress Notes (Signed)
 HPI:  71-year-old gentleman presenting for initial evaluation of chest pain with an abnormal stress test. The patient has a history of coronary artery disease and had a remote inferior wall MI in 1998. He was treated with primary angioplasty to the right coronary artery. He was noted to have residual moderate LAD stenosis. He's been managed medically since then. He has been symptom-free until about 2 months ago he began to develop chest pressure with exertion. He describes a left-sided chest pressure when he walks on a treadmill. This occurs after about 15 minutes of walking. He slows down and is able to continue with his walk without further symptoms. He is able to do the elliptical without exertional chest pain. He has no symptoms with low-level activity such as work around his house or routine activities of daily living. He specifically denies shortness of breath, edema, palpitations, orthopnea, or PND. He's had no resting chest pain.  The patient underwent an exercise stress Myoview scan last week and this demonstrated good exercise tolerance as he exercised for 10 minutes according to the Bruce protocol. He had mild ischemia noted in the anteroseptal and anteroapical regions with a quantitative left ventricular ejection fraction of 56%. His previous nuclear stress test had been normal.  Outpatient Encounter Prescriptions as of 09/28/2011  Medication Sig Dispense Refill  . aspirin 81 MG tablet Take 81 mg by mouth daily.        . Cholecalciferol (VITAMIN D) 2000 UNITS tablet Take 2,000 Units by mouth daily.        . co-enzyme Q-10 30 MG capsule Take 30 mg by mouth daily.        . fish oil-omega-3 fatty acids 1000 MG capsule Take 2 g by mouth daily.        . Glucosamine-Chondroitin (MOVE FREE PO) Take 1 tablet by mouth daily.        . Multiple Vitamin (MULTIVITAMIN) tablet Take 1 tablet by mouth daily.        . NITROSTAT 0.3 MG SL tablet PLACE 1 TABLET UNDER THE TONGUE EVERY 5 MINUTES IF NEEDED  100  tablet  0  . Resveratrol 250 MG CAPS Take 1 capsule by mouth daily.        . Saw Palmetto, Serenoa repens, 450 MG CAPS Take 1 capsule by mouth 2 (two) times daily.        . simvastatin (ZOCOR) 40 MG tablet take 1 tablet by mouth at bedtime  90 tablet  0  . Tamsulosin HCl (FLOMAX) 0.4 MG CAPS Take 1 capsule (0.4 mg total) by mouth daily.  90 capsule  3  . isosorbide mononitrate (IMDUR) 30 MG 24 hr tablet Take 1 tablet (30 mg total) by mouth daily.  30 tablet  3  . metoprolol succinate (TOPROL-XL) 25 MG 24 hr tablet Take 1 tablet (25 mg total) by mouth daily.  30 tablet  3    Penicillins  Past Medical History  Diagnosis Date  . CAD (coronary artery disease)   . Hyperlipidemia   . Myocardial infarction   . BPH (benign prostatic hyperplasia)   . Diverticulosis of colon   . MYOCARDIAL INFARCTION, HX OF 07/20/2006    Qualifier: Diagnosis of  By: Swords MD, Bruce      Past Surgical History  Procedure Date  . Ptca     History   Social History  . Marital Status: Married    Spouse Name: N/A    Number of Children: N/A  . Years of Education:   N/A   Occupational History  . Not on file.   Social History Main Topics  . Smoking status: Never Smoker   . Smokeless tobacco: Not on file  . Alcohol Use: 0.6 oz/week    1 Glasses of wine per week  . Drug Use:   . Sexually Active:    Other Topics Concern  . Not on file   Social History Narrative  . No narrative on file    Family History  Problem Relation Age of Onset  . Heart disease Mother     CABG  . Aneurysm Father     AAA  . Stroke Father   . Kidney disease Father     renal failure  . Aneurysm Paternal Uncle     AAA    ROS: General: no fevers/chills/night sweats Eyes: no blurry vision, diplopia, or amaurosis ENT: no sore throat or hearing loss Resp: no cough, wheezing, or hemoptysis CV: no edema or palpitations GI: no abdominal pain, nausea, vomiting, diarrhea, or constipation GU: no dysuria, frequency, or  hematuria Skin: no rash Neuro: no headache, numbness, tingling, or weakness of extremities Musculoskeletal: no joint pain or swelling Heme: no bleeding, DVT, or easy bruising Endo: no polydipsia or polyuria  BP 136/86  Pulse 68  Ht 6' 2" (1.88 m)  Wt 86.637 kg (191 lb)  BMI 24.52 kg/m2  PHYSICAL EXAM: Pt is alert and oriented, WD, WN, in no distress. HEENT: normal Neck: JVP normal. Carotid upstrokes normal without bruits. No thyromegaly. Lungs: equal expansion, clear bilaterally CV: Apex is discrete and nondisplaced, RRR without murmur or gallop Abd: soft, NT, +BS, no bruit, no hepatosplenomegaly Back: no CVA tenderness Ext: no C/C/E        Femoral pulses 2+= without bruits        DP/PT pulses intact and = Skin: warm and dry without rash Neuro: CNII-XII intact             Strength intact = bilaterally  EKG:  Reviewed from 09/10/2011. This showed sinus bradycardia 56 beats per minute otherwise within normal limits.  ASSESSMENT AND PLAN:  

## 2011-09-28 NOTE — Assessment & Plan Note (Signed)
Recent lipids reviewed. The patient is at goal as his LDL is less than 80. Continue simvastatin. He is followed by Dr. Cato Mulligan.

## 2011-10-01 ENCOUNTER — Telehealth: Payer: Self-pay | Admitting: Cardiovascular Disease

## 2011-10-01 NOTE — Telephone Encounter (Signed)
NEW MESSAGE: PT WANTS TO DISCUSS CATH/VS MEDICATION.  CATH IS SCHEDULED FOR 10-20-11 AND AFTER THINKING HE MAY WANT TO TRY MEDICATION FIRST.  PLEASE CALL AND DISCUSS WITH HIM.  DOES HE NEED ANOTHER OV TO DISCUSS.  [phone] OR CELL [phone]

## 2011-10-01 NOTE — Telephone Encounter (Signed)
I spoke with the pt and further explained the results of his myoview.  I also explained the difference between medical therapy and proceeding with cardiac catheterization. At this time the pt will leave cath as scheduled.  The pt will continue to determine if he would like to proceed with cath.  If the pt decides that he does not want to have this procedure then he will call the office.

## 2011-10-04 ENCOUNTER — Other Ambulatory Visit: Payer: Self-pay | Admitting: Cardiovascular Disease

## 2011-10-13 ENCOUNTER — Other Ambulatory Visit (INDEPENDENT_AMBULATORY_CARE_PROVIDER_SITE_OTHER): Payer: Medicare Other

## 2011-10-13 DIAGNOSIS — R931 Abnormal findings on diagnostic imaging of heart and coronary circulation: Secondary | ICD-10-CM

## 2011-10-13 DIAGNOSIS — I209 Angina pectoris, unspecified: Secondary | ICD-10-CM

## 2011-10-13 DIAGNOSIS — R9439 Abnormal result of other cardiovascular function study: Secondary | ICD-10-CM

## 2011-10-13 LAB — CBC WITH DIFFERENTIAL/PLATELET
Basophils Relative: 0.6 % (ref 0.0–3.0)
Eosinophils Absolute: 0.2 10*3/uL (ref 0.0–0.7)
Hemoglobin: 15 g/dL (ref 13.0–17.0)
Lymphocytes Relative: 35.2 % (ref 12.0–46.0)
MCHC: 33.2 g/dL (ref 30.0–36.0)
Monocytes Relative: 11.2 % (ref 3.0–12.0)
Neutro Abs: 3.2 10*3/uL (ref 1.4–7.7)
Neutrophils Relative %: 49.1 % (ref 43.0–77.0)
RBC: 5.13 Mil/uL (ref 4.22–5.81)
WBC: 6.4 10*3/uL (ref 4.5–10.5)

## 2011-10-13 LAB — PROTIME-INR: INR: 1 ratio (ref 0.8–1.0)

## 2011-10-13 LAB — BASIC METABOLIC PANEL
Calcium: 9 mg/dL (ref 8.4–10.5)
Creatinine, Ser: 1.1 mg/dL (ref 0.4–1.5)
GFR: 68.96 mL/min (ref >60.00)
Sodium: 139 mEq/L (ref 135–145)

## 2011-10-19 ENCOUNTER — Other Ambulatory Visit: Payer: Self-pay | Admitting: Internal Medicine

## 2011-10-19 ENCOUNTER — Other Ambulatory Visit: Payer: Self-pay | Admitting: Cardiovascular Disease

## 2011-10-19 DIAGNOSIS — I209 Angina pectoris, unspecified: Secondary | ICD-10-CM

## 2011-10-19 DIAGNOSIS — R931 Abnormal findings on diagnostic imaging of heart and coronary circulation: Secondary | ICD-10-CM

## 2011-10-19 MED ORDER — METOPROLOL SUCCINATE ER 25 MG PO TB24: 25.0000 mg | ORAL_TABLET | Freq: Every day | ORAL | Status: DC

## 2011-10-19 NOTE — Telephone Encounter (Signed)
Pt needs this Rx changed to 90 day supply not 30 days

## 2011-10-20 ENCOUNTER — Encounter (HOSPITAL_BASED_OUTPATIENT_CLINIC_OR_DEPARTMENT_OTHER): Payer: Self-pay

## 2011-10-20 ENCOUNTER — Encounter (HOSPITAL_BASED_OUTPATIENT_CLINIC_OR_DEPARTMENT_OTHER)
Admission: RE | Disposition: A | Discharge status: Hospice | Payer: Self-pay | Source: Ambulatory Visit | Attending: Cardiovascular Disease

## 2011-10-20 ENCOUNTER — Inpatient Hospital Stay (HOSPITAL_BASED_OUTPATIENT_CLINIC_OR_DEPARTMENT_OTHER)
Admission: RE | Admit: 2011-10-20 | Discharge: 2011-10-20 | Disposition: A | Discharge status: Hospice | Payer: Medicare Other | Source: Ambulatory Visit | Attending: Cardiovascular Disease | Admitting: Cardiovascular Disease

## 2011-10-20 DIAGNOSIS — E785 Hyperlipidemia, unspecified: Secondary | ICD-10-CM | POA: Insufficient documentation

## 2011-10-20 DIAGNOSIS — I209 Angina pectoris, unspecified: Secondary | ICD-10-CM | POA: Insufficient documentation

## 2011-10-20 DIAGNOSIS — I2510 Atherosclerotic heart disease of native coronary artery without angina pectoris: Secondary | ICD-10-CM

## 2011-10-20 DIAGNOSIS — I252 Old myocardial infarction: Secondary | ICD-10-CM | POA: Insufficient documentation

## 2011-10-20 SURGERY — JV LEFT HEART CATHETERIZATION WITH CORONARY ANGIOGRAM
Anesthesia: Moderate Sedation

## 2011-10-20 MED ORDER — SODIUM CHLORIDE 0.9 % IV SOLN
INTRAVENOUS | Status: DC
  Administered 2011-10-20: 09:00:00 via INTRAVENOUS

## 2011-10-20 MED ORDER — SODIUM CHLORIDE 0.9 % IV SOLN: 250.0000 mL | INTRAVENOUS | Status: DC | PRN

## 2011-10-20 MED ORDER — DIAZEPAM 5 MG PO TABS
5.0000 mg | ORAL_TABLET | ORAL | Status: AC
  Administered 2011-10-20: 5 mg via ORAL

## 2011-10-20 MED ORDER — ACETAMINOPHEN 325 MG PO TABS: 650.0000 mg | ORAL_TABLET | ORAL | Status: DC | PRN

## 2011-10-20 MED ORDER — SODIUM CHLORIDE 0.9 % IJ SOLN: 3.0000 mL | INTRAMUSCULAR | Status: DC | PRN

## 2011-10-20 MED ORDER — ASPIRIN 81 MG PO CHEW
324.0000 mg | CHEWABLE_TABLET | ORAL | Status: AC
  Administered 2011-10-20: 324 mg via ORAL

## 2011-10-20 MED ORDER — ONDANSETRON HCL 4 MG/2ML IJ SOLN: 4.0000 mg | Freq: Four times a day (QID) | INTRAMUSCULAR | Status: DC | PRN

## 2011-10-20 MED ORDER — SODIUM CHLORIDE 0.9 % IV SOLN: 1.0000 mL/kg/h | INTRAVENOUS | Status: DC

## 2011-10-20 MED ORDER — SODIUM CHLORIDE 0.9 % IJ SOLN: 3.0000 mL | Freq: Two times a day (BID) | INTRAMUSCULAR | Status: DC

## 2011-10-20 NOTE — Interval H&P Note (Signed)
History and Physical Interval Note:  10/20/2011 10:07 AM  Collin [name]  has presented today for surgery, with the diagnosis of cp  The various methods of treatment have been discussed with the patient and family. After consideration of risks, benefits and other options for treatment, the patient has consented to  Procedure(s) (LRB) with comments: JV LEFT HEART CATHETERIZATION WITH CORONARY ANGIOGRAM (N/A) as a surgical intervention .  The patient's history has been reviewed, patient examined, no change in status, stable for surgery.  I have reviewed the patient's chart and labs.  Questions were answered to the patient's satisfaction.     Tonny Bollman

## 2011-10-20 NOTE — Progress Notes (Signed)
Pt voided 400cc of clear yellow urine.  Tolerated well.  Complleted 100% of meal.  Denies any discomfort at this time.

## 2011-10-20 NOTE — H&P (View-Only) (Signed)
HPI:  71 year old gentleman presenting for initial evaluation of chest pain with an abnormal stress test. The patient has a history of coronary artery disease and had a remote inferior wall MI in 1998. He was treated with primary angioplasty to the right coronary artery. He was noted to have residual moderate LAD stenosis. He's been managed medically since then. He has been symptom-free until about 2 months ago he began to develop chest pressure with exertion. He describes a left-sided chest pressure when he walks on a treadmill. This occurs after about 15 minutes of walking. He slows down and is able to continue with his walk without further symptoms. He is able to do the elliptical without exertional chest pain. He has no symptoms with low-level activity such as work around his house or routine activities of daily living. He specifically denies shortness of breath, edema, palpitations, orthopnea, or PND. He's had no resting chest pain.  The patient underwent an exercise stress Myoview scan last week and this demonstrated good exercise tolerance as he exercised for 10 minutes according to the Bruce protocol. He had mild ischemia noted in the anteroseptal and anteroapical regions with a quantitative left ventricular ejection fraction of 56%. His previous nuclear stress test had been normal.  Outpatient Encounter Prescriptions as of 09/28/2011  Medication Sig Dispense Refill  . aspirin 81 MG tablet Take 81 mg by mouth daily.        . Cholecalciferol (VITAMIN D) 2000 UNITS tablet Take 2,000 Units by mouth daily.        Marland Kitchen co-enzyme Q-10 30 MG capsule Take 30 mg by mouth daily.        . fish oil-omega-3 fatty acids 1000 MG capsule Take 2 g by mouth daily.        . Glucosamine-Chondroitin (MOVE FREE PO) Take 1 tablet by mouth daily.        . Multiple Vitamin (MULTIVITAMIN) tablet Take 1 tablet by mouth daily.        Marland Kitchen NITROSTAT 0.3 MG SL tablet PLACE 1 TABLET UNDER THE TONGUE EVERY 5 MINUTES IF NEEDED  100  tablet  0  . Resveratrol 250 MG CAPS Take 1 capsule by mouth daily.        . Saw Palmetto, Serenoa repens, 450 MG CAPS Take 1 capsule by mouth 2 (two) times daily.        . simvastatin (ZOCOR) 40 MG tablet take 1 tablet by mouth at bedtime  90 tablet  0  . Tamsulosin HCl (FLOMAX) 0.4 MG CAPS Take 1 capsule (0.4 mg total) by mouth daily.  90 capsule  3  . isosorbide mononitrate (IMDUR) 30 MG 24 hr tablet Take 1 tablet (30 mg total) by mouth daily.  30 tablet  3  . metoprolol succinate (TOPROL-XL) 25 MG 24 hr tablet Take 1 tablet (25 mg total) by mouth daily.  30 tablet  3    Penicillins  Past Medical History  Diagnosis Date  . CAD (coronary artery disease)   . Hyperlipidemia   . Myocardial infarction   . BPH (benign prostatic hyperplasia)   . Diverticulosis of colon   . MYOCARDIAL INFARCTION, HX OF 07/20/2006    Qualifier: Diagnosis of  By: Cato Mulligan MD, Bruce      Past Surgical History  Procedure Date  . Ptca     History   Social History  . Marital Status: Married    Spouse Name: N/A    Number of Children: N/A  . Years of Education:  N/A   Occupational History  . Not on file.   Social History Main Topics  . Smoking status: Never Smoker   . Smokeless tobacco: Not on file  . Alcohol Use: 0.6 oz/week    1 Glasses of wine per week  . Drug Use:   . Sexually Active:    Other Topics Concern  . Not on file   Social History Narrative  . No narrative on file    Family History  Problem Relation Age of Onset  . Heart disease Mother     CABG  . Aneurysm Father     AAA  . Stroke Father   . Kidney disease Father     renal failure  . Aneurysm Paternal Uncle     AAA    ROS: General: no fevers/chills/night sweats Eyes: no blurry vision, diplopia, or amaurosis ENT: no sore throat or hearing loss Resp: no cough, wheezing, or hemoptysis CV: no edema or palpitations GI: no abdominal pain, nausea, vomiting, diarrhea, or constipation GU: no dysuria, frequency, or  hematuria Skin: no rash Neuro: no headache, numbness, tingling, or weakness of extremities Musculoskeletal: no joint pain or swelling Heme: no bleeding, DVT, or easy bruising Endo: no polydipsia or polyuria  BP 136/86  Pulse 68  Ht 6\' 2"  (1.88 m)  Wt 86.637 kg (191 lb)  BMI 24.52 kg/m2  PHYSICAL EXAM: Pt is alert and oriented, WD, WN, in no distress. HEENT: normal Neck: JVP normal. Carotid upstrokes normal without bruits. No thyromegaly. Lungs: equal expansion, clear bilaterally CV: Apex is discrete and nondisplaced, RRR without murmur or gallop Abd: soft, NT, +BS, no bruit, no hepatosplenomegaly Back: no CVA tenderness Ext: no C/C/E        Femoral pulses 2+= without bruits        DP/PT pulses intact and = Skin: warm and dry without rash Neuro: CNII-XII intact             Strength intact = bilaterally  EKG:  Reviewed from 09/10/2011. This showed sinus bradycardia 56 beats per minute otherwise within normal limits.  ASSESSMENT AND PLAN:

## 2011-10-20 NOTE — CV Procedure (Signed)
   Cardiac Catheterization Procedure Note  Name: Collin [name] Striplin MRN: [id number] DOB: October 09, 1940  Procedure: Left Heart Cath, Selective Coronary Angiography, LV angiography  Indication: New onset exertional angina in a 71 year old gentleman with known CAD. The patient has CCS class 1 anginal symptoms and an abnormal but low risk Myoview.   Procedural Details: The right wrist was prepped, draped, and anesthetized with 1% lidocaine. Using the modified Seldinger technique, a 5 French sheath was introduced into the right radial artery. 3 mg of verapamil was administered through the sheath, weight-based unfractionated heparin was administered intravenously. Standard Judkins catheters were used for selective coronary angiography and left ventriculography. Catheter exchanges were performed over an exchange length guidewire. There were no immediate procedural complications. A TR band was used for radial hemostasis at the completion of the procedure.  The patient was transferred to the post catheterization recovery area for further monitoring.  Procedural Findings: Hemodynamics: AO 122/59 with a mean of 85 LV 131/17  Coronary angiography: Coronary dominance: right  Left mainstem: Moderate calcification. No obstructive disease.  Left anterior descending (LAD): The LAD is heavily calcified throughout the proximal and mid segments. The proximal LAD has no obstructive disease. The first diagonal branch is small and it has an 80% ostial narrowing. The second diagonal branches of moderate caliber and it has an 80% stenosis in the proximal aspect. The mid LAD at the origin of the second diagonal branch shows a calcified 80% stenosis and there is a second lesion at the junction of the mid and distal LAD with 80% stenosis. The segment between the 2 stenoses is diffusely diseased. The distal LAD is patent with minor diffuse disease.  Left circumflex (LCx): The left circumflex is of moderate caliber. The vessel  is patent throughout its course down the AV groove. The first obtuse marginal branch divides into twin vessels. Both branch vessels are small and there is 50-60% ostial stenosis. The mid circumflex has 50% stenosis.  Right coronary artery (RCA): This is a large, dominant vessel. The vessel is heavily calcified throughout its course. There is mild nonobstructive plaque in the mid vessel with approximately 30% stenosis. The PDA and PLA branches are patent without significant stenosis.  Left ventriculography: Left ventricular systolic function is normal, LVEF is estimated at 55-65%, there is no significant mitral regurgitation   Final Conclusions:   1. Severe single-vessel coronary artery disease involving the mid LAD 2. Nonobstructive disease of the right coronary artery and left circumflex 3. Preserved LV function  Recommendations: Considering the patient's minimal symptoms at present with CCS class I angina, recommend ongoing medical therapy. He would be a candidate for PCI of the mid LAD if his symptoms progress.  Tonny Bollman 10/20/2011, 11:04 AM

## 2011-10-20 NOTE — Progress Notes (Signed)
3cc removed from TR Band sats 94%

## 2011-10-21 ENCOUNTER — Encounter (HOSPITAL_BASED_OUTPATIENT_CLINIC_OR_DEPARTMENT_OTHER): Payer: Self-pay

## 2011-11-17 ENCOUNTER — Ambulatory Visit (INDEPENDENT_AMBULATORY_CARE_PROVIDER_SITE_OTHER): Payer: Medicare Other | Admitting: Cardiovascular Disease

## 2011-11-17 ENCOUNTER — Encounter: Payer: Self-pay | Admitting: Cardiovascular Disease

## 2011-11-17 VITALS — BP 122/80 | HR 59 | Ht 74.0 in | Wt 194.0 lb

## 2011-11-17 DIAGNOSIS — I2510 Atherosclerotic heart disease of native coronary artery without angina pectoris: Secondary | ICD-10-CM

## 2011-11-17 MED ORDER — SIMVASTATIN 40 MG PO TABS: 40.0000 mg | ORAL_TABLET | Freq: Every day | ORAL | Status: DC

## 2011-11-17 NOTE — Patient Instructions (Addendum)
Your physician wants you to follow-up in: ONE YEAR WITH DR COOPER You will receive a reminder letter in the mail two months in advance. If you don't receive a letter, please call our office to schedule the follow-up appointment.  

## 2011-11-17 NOTE — Progress Notes (Signed)
HPI:  71 year old gentleman presenting for followup evaluation. The patient has coronary artery disease with history of [place] inferior wall MI in 1998 treated with primary angioplasty of the right coronary artery. He was noted to have moderate LAD stenosis and this was treated medically. He developed exertional chest pain and I evaluated him 09/28/2011. A nuclear stress scan showed mild anteroapical ischemia. His left ventricular ejection fraction was 56% by the nuclear study. He was started on a beta blocker and anginal symptoms resolved. A cardiac catheterization demonstrated severe stenosis of the mid LAD. There is nonobstructive stenosis of the left circumflex and right coronary arteries. Because the patient's symptoms were minimal I elected to treat him medically. He returns today for further discussion.  He continues to feel well. He maintains a regular exercise program and has no chest pain or pressure. He denies dyspnea, palpitations, or leg swelling.  Outpatient Encounter Prescriptions as of 11/17/2011  Medication Sig Dispense Refill  . aspirin 81 MG tablet Take 81 mg by mouth daily.        . Cholecalciferol (VITAMIN D) 2000 UNITS tablet Take 2,000 Units by mouth daily.        Marland Kitchen co-enzyme Q-10 30 MG capsule Take 30 mg by mouth daily.        . fish oil-omega-3 fatty acids 1000 MG capsule Take 2 g by mouth daily.        . Glucosamine-Chondroitin (MOVE FREE PO) Take 1 tablet by mouth daily.        . metoprolol succinate (TOPROL-XL) 25 MG 24 hr tablet Take 1 tablet (25 mg total) by mouth daily.  90 tablet  3  . Multiple Vitamin (MULTIVITAMIN) tablet Take 1 tablet by mouth daily.        Marland Kitchen NITROSTAT 0.3 MG SL tablet PLACE 1 TABLET UNDER THE TONGUE EVERY 5 MINUTES IF NEEDED  100 tablet  0  . Resveratrol 250 MG CAPS Take 1 capsule by mouth daily.        . Saw Palmetto, Serenoa repens, 450 MG CAPS Take 1 capsule by mouth 2 (two) times daily.        . Tamsulosin HCl (FLOMAX) 0.4 MG CAPS Take 1  capsule (0.4 mg total) by mouth daily.  90 capsule  3  . DISCONTD: simvastatin (ZOCOR) 40 MG tablet take 1 tablet by mouth at bedtime  30 tablet  5  . simvastatin (ZOCOR) 40 MG tablet Take 1 tablet (40 mg total) by mouth at bedtime.  90 tablet  3  . DISCONTD: isosorbide mononitrate (IMDUR) 30 MG 24 hr tablet Take 1 tablet (30 mg total) by mouth daily.  30 tablet  3    Allergies  Allergen Reactions  . Penicillins     REACTION: rash    Past Medical History  Diagnosis Date  . CAD (coronary artery disease)   . Hyperlipidemia   . Myocardial infarction   . BPH (benign prostatic hyperplasia)   . Diverticulosis of colon   . MYOCARDIAL INFARCTION, HX OF 07/20/2006    Qualifier: Diagnosis of  By: Cato Mulligan MD, Bruce      ROS: Negative except as per HPI  BP 122/80  Pulse 59  Ht 6\' 2"  (1.88 m)  Wt 87.998 kg (194 lb)  BMI 24.91 kg/m2  PHYSICAL EXAM: Pt is alert and oriented, NAD Physical exam deferred today as our time was spent in discussion.  EKG:  Normal sinus rhythm 59 beats per minute, within normal limits.  ASSESSMENT AND PLAN: 1.  Coronary artery disease, native vessel. Recent cardiac catheterization findings reviewed with the patient. We will continue with his current medical program. He has responded well to the addition of a beta blocker. He should remain on aspirin for antiplatelet therapy and simvastatin for lipid lowering. He'll followup in 12 months, but will call if problems arise in the interim.  2. Hyperlipidemia. The patient's lipid panel was reviewed and his lipids are excellent. He is followed by Dr. Cato Mulligan.  Tonny Bollman 11/17/2011 6:13 PM

## 2012-07-06 ENCOUNTER — Telehealth: Payer: Self-pay | Admitting: Internal Medicine

## 2012-07-06 NOTE — Telephone Encounter (Signed)
Pt would like a call from Dr Cato Mulligan concerning his appt. First available CPX for pt is Sept.  Pt refused to accept Sept appt. Pls advise.

## 2012-07-10 NOTE — Telephone Encounter (Signed)
Call and see what  he would like.

## 2012-07-10 NOTE — Telephone Encounter (Signed)
Pt rescheduled to 09/19/12 at 8:45

## 2012-09-05 ENCOUNTER — Other Ambulatory Visit: Payer: Medicare Other

## 2012-09-06 ENCOUNTER — Other Ambulatory Visit (INDEPENDENT_AMBULATORY_CARE_PROVIDER_SITE_OTHER): Payer: Medicare Other

## 2012-09-06 DIAGNOSIS — Z0000 Encounter for general adult medical examination without abnormal findings: Secondary | ICD-10-CM

## 2012-09-06 DIAGNOSIS — N400 Enlarged prostate without lower urinary tract symptoms: Secondary | ICD-10-CM

## 2012-09-06 DIAGNOSIS — E785 Hyperlipidemia, unspecified: Secondary | ICD-10-CM

## 2012-09-06 LAB — CBC WITH DIFFERENTIAL/PLATELET
Basophils Absolute: 0 10*3/uL (ref 0.0–0.1)
Eosinophils Absolute: 0.3 10*3/uL (ref 0.0–0.7)
HCT: 43.8 % (ref 39.0–52.0)
Lymphs Abs: 2.4 10*3/uL (ref 0.7–4.0)
MCHC: 33.1 g/dL (ref 30.0–36.0)
Monocytes Relative: 12 % (ref 3.0–12.0)
Platelets: 219 10*3/uL (ref 150.0–400.0)
RDW: 13.5 % (ref 11.5–14.6)

## 2012-09-06 LAB — POCT URINALYSIS DIPSTICK
Glucose, UA: NEGATIVE
Leukocytes, UA: NEGATIVE
Nitrite, UA: NEGATIVE
Protein, UA: NEGATIVE
Spec Grav, UA: 1.015
Urobilinogen, UA: 0.2

## 2012-09-06 LAB — HEPATIC FUNCTION PANEL
AST: 38 U/L — ABNORMAL HIGH (ref 0–37)
Albumin: 4.1 g/dL (ref 3.5–5.2)
Total Bilirubin: 1.2 mg/dL (ref 0.3–1.2)

## 2012-09-06 LAB — BASIC METABOLIC PANEL
BUN: 15 mg/dL (ref 6–23)
CO2: 28 mEq/L (ref 19–32)
Calcium: 9 mg/dL (ref 8.4–10.5)
GFR: 67.39 mL/min (ref >60.00)
Glucose, Bld: 98 mg/dL (ref 70–99)
Potassium: 4.8 mEq/L (ref 3.5–5.1)

## 2012-09-06 LAB — TSH: TSH: 1.68 u[IU]/mL (ref 0.35–5.50)

## 2012-09-06 LAB — PSA: PSA: 4.02 ng/mL — ABNORMAL HIGH (ref 0.10–4.00)

## 2012-09-06 LAB — LIPID PANEL
Cholesterol: 142 mg/dL (ref 0–200)
Triglycerides: 66 mg/dL (ref 0.0–149.0)

## 2012-09-11 ENCOUNTER — Encounter: Payer: Medicare Other | Admitting: Internal Medicine

## 2012-09-19 ENCOUNTER — Ambulatory Visit (INDEPENDENT_AMBULATORY_CARE_PROVIDER_SITE_OTHER): Payer: Medicare Other | Admitting: Internal Medicine

## 2012-09-19 ENCOUNTER — Encounter: Payer: Self-pay | Admitting: Internal Medicine

## 2012-09-19 VITALS — BP 118/72 | HR 60 | Temp 98.0°F | Ht 74.0 in | Wt 193.0 lb

## 2012-09-19 DIAGNOSIS — Z0000 Encounter for general adult medical examination without abnormal findings: Secondary | ICD-10-CM

## 2012-09-19 NOTE — Progress Notes (Signed)
cpx  Past Medical History  Diagnosis Date  . CAD (coronary artery disease)   . Hyperlipidemia   . Myocardial infarction   . BPH (benign prostatic hyperplasia)   . Diverticulosis of colon   . MYOCARDIAL INFARCTION, HX OF 07/20/2006    Qualifier: Diagnosis of  By: Cato Mulligan MD, Raiden Yearwood      History   Social History  . Marital Status: Married    Spouse Name: N/A    Number of Children: N/A  . Years of Education: N/A   Occupational History  . Not on file.   Social History Main Topics  . Smoking status: Never Smoker   . Smokeless tobacco: Not on file  . Alcohol Use: 0.6 oz/week    1 Glasses of wine per week  . Drug Use:   . Sexual Activity:    Other Topics Concern  . Not on file   Social History Narrative  . No narrative on file    Past Surgical History  Procedure Laterality Date  . Ptca      Family History  Problem Relation Age of Onset  . Heart disease Mother     CABG  . Aneurysm Father     AAA  . Stroke Father   . Kidney disease Father     renal failure  . Aneurysm Paternal Uncle     AAA    Allergies  Allergen Reactions  . Penicillins     REACTION: rash    Current Outpatient Prescriptions on File Prior to Visit  Medication Sig Dispense Refill  . aspirin 81 MG tablet Take 81 mg by mouth daily.        . Cholecalciferol (VITAMIN D) 2000 UNITS tablet Take 2,000 Units by mouth daily.        Marland Kitchen co-enzyme Q-10 30 MG capsule Take 30 mg by mouth daily.        . fish oil-omega-3 fatty acids 1000 MG capsule Take 2 g by mouth daily.        . Glucosamine-Chondroitin (MOVE FREE PO) Take 1 tablet by mouth daily.        . metoprolol succinate (TOPROL-XL) 25 MG 24 hr tablet Take 1 tablet (25 mg total) by mouth daily.  90 tablet  3  . Multiple Vitamin (MULTIVITAMIN) tablet Take 1 tablet by mouth daily.        Marland Kitchen NITROSTAT 0.3 MG SL tablet PLACE 1 TABLET UNDER THE TONGUE EVERY 5 MINUTES IF NEEDED  100 tablet  0  . Resveratrol 250 MG CAPS Take 1 capsule by mouth daily.         . Saw Palmetto, Serenoa repens, 450 MG CAPS Take 1 capsule by mouth 2 (two) times daily.        . simvastatin (ZOCOR) 40 MG tablet Take 1 tablet (40 mg total) by mouth at bedtime.  90 tablet  3  . Tamsulosin HCl (FLOMAX) 0.4 MG CAPS Take 1 capsule (0.4 mg total) by mouth daily.  90 capsule  3   No current facility-administered medications on file prior to visit.     patient denies chest pain, shortness of breath, orthopnea. Denies lower extremity edema, abdominal pain, change in appetite, change in bowel movements. Patient denies rashes, musculoskeletal complaints. No other specific complaints in a complete review of systems.   BP 118/72  Pulse 60  Temp(Src) 98 F (36.7 C) (Oral)  Ht 6\' 2"  (1.88 m)  Wt 193 lb (87.544 kg)  BMI 24.77 kg/m2  well-developed well-nourished male  in no acute distress. HEENT exam atraumatic, normocephalic, neck supple without jugular venous distention. Chest clear to auscultation cardiac exam S1-S2 are regular. Abdominal exam overweight with bowel sounds, soft and nontender. Extremities no edema. Neurologic exam is alert with a normal gait.  Well visit: health maint UTD

## 2012-10-13 ENCOUNTER — Other Ambulatory Visit: Payer: Self-pay | Admitting: Cardiovascular Disease

## 2012-10-18 ENCOUNTER — Other Ambulatory Visit: Payer: Self-pay | Admitting: Cardiovascular Disease

## 2012-11-07 ENCOUNTER — Other Ambulatory Visit: Payer: Self-pay | Admitting: Cardiovascular Disease

## 2012-11-09 ENCOUNTER — Other Ambulatory Visit: Payer: Self-pay | Admitting: Internal Medicine

## 2013-01-13 ENCOUNTER — Other Ambulatory Visit: Payer: Self-pay | Admitting: Cardiovascular Disease

## 2013-02-16 ENCOUNTER — Other Ambulatory Visit: Payer: Self-pay | Admitting: Cardiovascular Disease

## 2013-02-20 ENCOUNTER — Telehealth: Payer: Self-pay | Admitting: Nurse Practitioner

## 2013-02-20 MED ORDER — METOPROLOL SUCCINATE ER 25 MG PO TB24: 25.0000 mg | ORAL_TABLET | Freq: Every day | ORAL | Status: DC

## 2013-02-20 MED ORDER — SIMVASTATIN 40 MG PO TABS: 40.0000 mg | ORAL_TABLET | Freq: Every day | ORAL | Status: DC

## 2013-02-20 NOTE — Telephone Encounter (Signed)
Spoke with patient who refuses to make appointment unless Dr. Burt Knack tells him he will not refill medications unless he schedules an appointment.  Spoke with patient and made him aware that Dr. Burt Knack advised that he will refill medications for this year only.  He would like to see the patient before the end of this year.  If patient does not want to come in for an office visit, then he can have Dr. Leanne Chang prescribe his beta blocker and statin drug.  Dr. Burt Knack advised for patient to call in the meantime with questions or concerns.  Patient verbalized understanding and agreement.  I advised patient that I am scheduling a recall for him to make an appointment in December.  Patient understands that if he does not schedule and show up for an office visit that we will no longer refill his prescriptions. Refills to [facility] per patient request

## 2013-02-20 NOTE — Telephone Encounter (Signed)
Received note from Guinevere Ferrari, [place], Patient Care Advocate that patient needs refills of medications and does not want to come in for an office visit.  I left message for patient that he needs to schedule an appointment due to his history (CAD) and to please call the office to make an appointment.

## 2013-05-01 ENCOUNTER — Telehealth: Payer: Self-pay | Admitting: Internal Medicine

## 2013-05-01 NOTE — Telephone Encounter (Signed)
On review of chart had physical in 08/2012 so not due until August for another physical. He can schedule next available Monday 11:15 new pt slot with if he wishes to establish care with me if he prefers - and then we could do his physical sometime after that as long as after august.

## 2013-05-01 NOTE — Telephone Encounter (Signed)
Pt is trying to see if he can switch to dr. Maudie Mercury, pt states he needs a physical in June, and does not want to wait on the new pcp that will be seeing dr. Leanne Chang pt's. Pt states he prefer someone that is already here.

## 2013-05-04 NOTE — Telephone Encounter (Signed)
Called pt and left message for pt to call and schedule new pt appointment with dr. Maudie Mercury

## 2013-05-07 NOTE — Telephone Encounter (Signed)
Pt calling back with questions about Dr. Maudie Mercury who previously agreed to accepting as a new patient.  Pt insists on an Internal Med physician.  Pt wants to know will Dr. Raliegh Ip accept pt as a new patient. Please advise.

## 2013-05-07 NOTE — Telephone Encounter (Signed)
Please advise 

## 2013-05-07 NOTE — Telephone Encounter (Signed)
ok 

## 2013-05-07 NOTE — Telephone Encounter (Signed)
Dr. Maudie Mercury is Family Medicine not Internal Medicine.

## 2013-05-21 ENCOUNTER — Telehealth: Payer: Self-pay | Admitting: Internal Medicine

## 2013-05-21 NOTE — Telephone Encounter (Signed)
Pt does not want to come in for an initial visit because that would push his physical out farther he is  switching over from Dr Leanne Chang to Dr Burnice Logan he wants to have a physical in July and I scheduled him for a physical 07/31/13 will this be ok

## 2013-05-21 NOTE — Telephone Encounter (Signed)
Pt's last physical was August 26 th can not have physical till after that date due to insurance, will not cover unless it has been a year.

## 2013-07-31 ENCOUNTER — Ambulatory Visit (INDEPENDENT_AMBULATORY_CARE_PROVIDER_SITE_OTHER): Payer: Commercial Managed Care - HMO | Admitting: Internal Medicine

## 2013-07-31 ENCOUNTER — Encounter: Payer: Self-pay | Admitting: Internal Medicine

## 2013-07-31 VITALS — BP 136/80 | HR 75 | Temp 98.4°F | Resp 20 | Ht 73.5 in | Wt 199.0 lb

## 2013-07-31 DIAGNOSIS — I2510 Atherosclerotic heart disease of native coronary artery without angina pectoris: Secondary | ICD-10-CM

## 2013-07-31 DIAGNOSIS — E785 Hyperlipidemia, unspecified: Secondary | ICD-10-CM

## 2013-07-31 DIAGNOSIS — N400 Enlarged prostate without lower urinary tract symptoms: Secondary | ICD-10-CM

## 2013-07-31 MED ORDER — NITROGLYCERIN 0.3 MG SL SUBL: SUBLINGUAL_TABLET | SUBLINGUAL | Status: DC

## 2013-07-31 NOTE — Progress Notes (Signed)
Pre visit review using our clinic review tool, if applicable. No additional management support is needed unless otherwise documented below in the visit note. 

## 2013-07-31 NOTE — Patient Instructions (Signed)
Limit your sodium (Salt) intake  Please check your blood pressure on a regular basis.  If it is consistently greater than 150/90, please make an office appointment.  Return in 6 months for follow-up   

## 2013-07-31 NOTE — Progress Notes (Signed)
Subjective:    Patient ID: Collin [name], male    DOB: [birth year], 73 y.o.   MRN: [id number]  HPI  73 year old patient who is seen today to establish with my practice.  He is followed by cardiology with stable coronary artery disease.  He remains asymptomatic.  He is active with golf and at times, though walks 18 holes without difficulty. He has a number of concerns today including intermittent left heel pain.  Is also asking about screening for AAA.  Due to a brother and his father with the abdominal aneurysms.  He has a history of BPH and has mild obstructive symptoms.  He has been given a trial of Flomax in the past without much benefit. He also has some mild arthritic complaints especially involving the left thumb.  Past Medical History  Diagnosis Date  . CAD (coronary artery disease)   . Hyperlipidemia   . Myocardial infarction   . BPH (benign prostatic hyperplasia)   . Diverticulosis of colon   . MYOCARDIAL INFARCTION, HX OF 07/20/2006    Qualifier: Diagnosis of  By: Leanne Chang MD, Bruce      History   Social History  . Marital Status: Married    Spouse Name: N/A    Number of Children: N/A  . Years of Education: N/A   Occupational History  . Not on file.   Social History Main Topics  . Smoking status: Never Smoker   . Smokeless tobacco: Not on file  . Alcohol Use: 0.6 oz/week    1 Glasses of wine per week  . Drug Use:   . Sexual Activity:    Other Topics Concern  . Not on file   Social History Narrative  . No narrative on file    Past Surgical History  Procedure Laterality Date  . Ptca      Family History  Problem Relation Age of Onset  . Heart disease Mother     CABG  . Aneurysm Father     AAA  . Stroke Father   . Kidney disease Father     renal failure  . Aneurysm Paternal Uncle     AAA    Allergies  Allergen Reactions  . Penicillins     REACTION: rash    Current Outpatient Prescriptions on File Prior to Visit  Medication Sig Dispense  Refill  . aspirin 81 MG tablet Take 81 mg by mouth daily.        . Cholecalciferol (VITAMIN D) 2000 UNITS tablet Take 2,000 Units by mouth daily.        Marland Kitchen co-enzyme Q-10 30 MG capsule Take 30 mg by mouth daily.        . fish oil-omega-3 fatty acids 1000 MG capsule Take 2 g by mouth daily.        . Glucosamine-Chondroitin (MOVE FREE PO) Take 1 tablet by mouth daily.        . metoprolol succinate (TOPROL-XL) 25 MG 24 hr tablet Take 1 tablet (25 mg total) by mouth daily.  90 tablet  3  . Multiple Vitamin (MULTIVITAMIN) tablet Take 1 tablet by mouth daily.        Marland Kitchen Resveratrol 250 MG CAPS Take 1 capsule by mouth daily.        . Saw Palmetto, Serenoa repens, 450 MG CAPS Take 1 capsule by mouth 2 (two) times daily.        . simvastatin (ZOCOR) 40 MG tablet Take 1 tablet (40 mg total) by mouth daily  at 6 PM.  90 tablet  3   No current facility-administered medications on file prior to visit.    BP 136/80  Pulse 75  Temp(Src) 98.4 F (36.9 C) (Oral)  Resp 20  Ht 6' 1.5" (1.867 m)  Wt 199 lb (90.266 kg)  BMI 25.90 kg/m2  SpO2 96%       Review of Systems  Constitutional: Negative for fever, chills, appetite change and fatigue.  HENT: Negative for congestion, dental problem, ear pain, hearing loss, sore throat, tinnitus, trouble swallowing and voice change.   Eyes: Negative for pain, discharge and visual disturbance.  Respiratory: Negative for cough, chest tightness, wheezing and stridor.   Cardiovascular: Negative for chest pain, palpitations and leg swelling.  Gastrointestinal: Negative for nausea, vomiting, abdominal pain, diarrhea, constipation, blood in stool and abdominal distention.  Genitourinary: Positive for urgency and frequency. Negative for hematuria, flank pain, discharge, difficulty urinating and genital sores.  Musculoskeletal: Positive for arthralgias and back pain. Negative for gait problem, joint swelling, myalgias and neck stiffness.  Skin: Negative for rash.    Neurological: Negative for dizziness, syncope, speech difficulty, weakness, numbness and headaches.  Hematological: Negative for adenopathy. Does not bruise/bleed easily.  Psychiatric/Behavioral: Negative for behavioral problems and dysphoric mood. The patient is not nervous/anxious.        Objective:   Physical Exam  Constitutional: He is oriented to person, place, and time. He appears well-developed.  HENT:  Head: Normocephalic.  Right Ear: External ear normal.  Left Ear: External ear normal.  Eyes: Conjunctivae and EOM are normal.  Neck: Normal range of motion.  Cardiovascular: Normal rate and normal heart sounds.   Pulmonary/Chest: Breath sounds normal.  Abdominal: Bowel sounds are normal.  Musculoskeletal: Normal range of motion. He exhibits no edema and no tenderness.  Neurological: He is alert and oriented to person, place, and time.  Psychiatric: He has a normal mood and affect. His behavior is normal.          Assessment & Plan:   Coronary artery disease, stable.  Continue aggressive risk factor modification Hypertension, dyslipidemia, BPH, mildly symptomatic.  Symptoms seem fairly well controlled with fluid restriction.  CPX in 6 months Ultrasound for AAA screen

## 2013-08-21 ENCOUNTER — Ambulatory Visit
Admission: RE | Admit: 2013-08-21 | Discharge: 2013-08-21 | Disposition: A | Discharge status: Home / Self Care | Payer: Commercial Managed Care - HMO | Source: Ambulatory Visit | Attending: Internal Medicine | Admitting: Internal Medicine

## 2013-08-21 DIAGNOSIS — I2510 Atherosclerotic heart disease of native coronary artery without angina pectoris: Secondary | ICD-10-CM

## 2013-08-21 DIAGNOSIS — E785 Hyperlipidemia, unspecified: Secondary | ICD-10-CM

## 2013-09-25 ENCOUNTER — Other Ambulatory Visit (INDEPENDENT_AMBULATORY_CARE_PROVIDER_SITE_OTHER): Payer: Commercial Managed Care - HMO

## 2013-09-25 DIAGNOSIS — Z0000 Encounter for general adult medical examination without abnormal findings: Secondary | ICD-10-CM

## 2013-09-25 LAB — HEPATIC FUNCTION PANEL
ALK PHOS: 45 U/L (ref 39–117)
ALT: 24 U/L (ref 0–53)
AST: 30 U/L (ref 0–37)
Albumin: 4 g/dL (ref 3.5–5.2)
BILIRUBIN DIRECT: 0.1 mg/dL (ref 0.0–0.3)
BILIRUBIN TOTAL: 1.3 mg/dL — AB (ref 0.2–1.2)
Total Protein: 7 g/dL (ref 6.0–8.3)

## 2013-09-25 LAB — BASIC METABOLIC PANEL
BUN: 15 mg/dL (ref 6–23)
CALCIUM: 9.3 mg/dL (ref 8.4–10.5)
CO2: 29 mEq/L (ref 19–32)
Chloride: 103 mEq/L (ref 96–112)
Creatinine, Ser: 1.1 mg/dL (ref 0.4–1.5)
GFR: 66.52 mL/min (ref >60.00)
Glucose, Bld: 99 mg/dL (ref 70–99)
Potassium: 4.9 mEq/L (ref 3.5–5.1)
SODIUM: 138 meq/L (ref 135–145)

## 2013-09-25 LAB — CBC WITH DIFFERENTIAL/PLATELET
BASOS ABS: 0 10*3/uL (ref 0.0–0.1)
Basophils Relative: 0.4 % (ref 0.0–3.0)
Eosinophils Absolute: 0.3 10*3/uL (ref 0.0–0.7)
Eosinophils Relative: 4.8 % (ref 0.0–5.0)
HCT: 44.7 % (ref 39.0–52.0)
HEMOGLOBIN: 14.7 g/dL (ref 13.0–17.0)
LYMPHS PCT: 36.4 % (ref 12.0–46.0)
Lymphs Abs: 2.4 10*3/uL (ref 0.7–4.0)
MCHC: 32.9 g/dL (ref 30.0–36.0)
MCV: 88.9 fl (ref 78.0–100.0)
MONOS PCT: 10.9 % (ref 3.0–12.0)
Monocytes Absolute: 0.7 10*3/uL (ref 0.1–1.0)
NEUTROS ABS: 3.2 10*3/uL (ref 1.4–7.7)
Neutrophils Relative %: 47.5 % (ref 43.0–77.0)
Platelets: 210 10*3/uL (ref 150.0–400.0)
RBC: 5.03 Mil/uL (ref 4.22–5.81)
RDW: 13.2 % (ref 11.5–15.5)
WBC: 6.7 10*3/uL (ref 4.0–10.5)

## 2013-09-25 LAB — PSA: PSA: 4.83 ng/mL — ABNORMAL HIGH (ref 0.10–4.00)

## 2013-09-25 LAB — LIPID PANEL
Cholesterol: 136 mg/dL (ref 0–200)
HDL: 52 mg/dL (ref >39.00)
LDL Cholesterol: 71 mg/dL (ref 0–99)
NONHDL: 84
Total CHOL/HDL Ratio: 3
Triglycerides: 63 mg/dL (ref 0.0–149.0)
VLDL: 12.6 mg/dL (ref 0.0–40.0)

## 2013-09-25 LAB — POCT URINALYSIS DIPSTICK
BILIRUBIN UA: NEGATIVE
Glucose, UA: NEGATIVE
KETONES UA: NEGATIVE
Leukocytes, UA: NEGATIVE
NITRITE UA: NEGATIVE
PH UA: 6
Protein, UA: NEGATIVE
RBC UA: NEGATIVE
Spec Grav, UA: 1.01
Urobilinogen, UA: 0.2

## 2013-09-25 LAB — TSH: TSH: 2.09 u[IU]/mL (ref 0.35–4.50)

## 2013-10-02 ENCOUNTER — Encounter: Payer: Self-pay | Admitting: Internal Medicine

## 2013-10-02 ENCOUNTER — Ambulatory Visit (INDEPENDENT_AMBULATORY_CARE_PROVIDER_SITE_OTHER): Payer: Commercial Managed Care - HMO | Admitting: Internal Medicine

## 2013-10-02 ENCOUNTER — Telehealth: Payer: Self-pay | Admitting: Internal Medicine

## 2013-10-02 VITALS — BP 130/72 | HR 68 | Temp 98.0°F | Resp 20 | Ht 73.5 in | Wt 195.0 lb

## 2013-10-02 DIAGNOSIS — E785 Hyperlipidemia, unspecified: Secondary | ICD-10-CM

## 2013-10-02 DIAGNOSIS — C439 Malignant melanoma of skin, unspecified: Secondary | ICD-10-CM

## 2013-10-02 DIAGNOSIS — I2510 Atherosclerotic heart disease of native coronary artery without angina pectoris: Secondary | ICD-10-CM

## 2013-10-02 DIAGNOSIS — R972 Elevated prostate specific antigen [PSA]: Secondary | ICD-10-CM

## 2013-10-02 DIAGNOSIS — Z0000 Encounter for general adult medical examination without abnormal findings: Secondary | ICD-10-CM

## 2013-10-02 DIAGNOSIS — N400 Enlarged prostate without lower urinary tract symptoms: Secondary | ICD-10-CM

## 2013-10-02 NOTE — Patient Instructions (Signed)
Limit your sodium (Salt) intake    It is important that you exercise regularly, at least 20 minutes 3 to 4 times per week.  If you develop chest pain or shortness of breath seek  medical attention.  Health Maintenance A healthy lifestyle and preventative care can promote health and wellness.  Maintain regular health, dental, and eye exams.  Eat a healthy diet. Foods like vegetables, fruits, whole grains, low-fat dairy products, and lean protein foods contain the nutrients you need and are low in calories. Decrease your intake of foods high in solid fats, added sugars, and salt. Get information about a proper diet from your health care provider, if necessary.  Regular physical exercise is one of the most important things you can do for your health. Most adults should get at least 150 minutes of moderate-intensity exercise (any activity that increases your heart rate and causes you to sweat) each week. In addition, most adults need muscle-strengthening exercises on 2 or more days a week.   Maintain a healthy weight. The body mass index (BMI) is a screening tool to identify possible weight problems. It provides an estimate of body fat based on height and weight. Your health care provider can find your BMI and can help you achieve or maintain a healthy weight. For males 20 years and older:  A BMI below 18.5 is considered underweight.  A BMI of 18.5 to 24.9 is normal.  A BMI of 25 to 29.9 is considered overweight.  A BMI of 30 and above is considered obese.  Maintain normal blood lipids and cholesterol by exercising and minimizing your intake of saturated fat. Eat a balanced diet with plenty of fruits and vegetables. Blood tests for lipids and cholesterol should begin at age 20 and be repeated every 5 years. If your lipid or cholesterol levels are high, you are over age 50, or you are at high risk for heart disease, you may need your cholesterol levels checked more frequently.Ongoing high lipid  and cholesterol levels should be treated with medicines if diet and exercise are not working.  If you smoke, find out from your health care provider how to quit. If you do not use tobacco, do not start.  Lung cancer screening is recommended for adults aged 55-80 years who are at high risk for developing lung cancer because of a history of smoking. A yearly low-dose CT scan of the lungs is recommended for people who have at least a 30-pack-year history of smoking and are current smokers or have quit within the past 15 years. A pack year of smoking is smoking an average of 1 pack of cigarettes a day for 1 year (for example, a 30-pack-year history of smoking could mean smoking 1 pack a day for 30 years or 2 packs a day for 15 years). Yearly screening should continue until the smoker has stopped smoking for at least 15 years. Yearly screening should be stopped for people who develop a health problem that would prevent them from having lung cancer treatment.  If you choose to drink alcohol, do not have more than 2 drinks per day. One drink is considered to be 12 oz (360 mL) of beer, 5 oz (150 mL) of wine, or 1.5 oz (45 mL) of liquor.  Avoid the use of street drugs. Do not share needles with anyone. Ask for help if you need support or instructions about stopping the use of drugs.  High blood pressure causes heart disease and increases the risk of stroke.   Blood pressure should be checked at least every 1-2 years. Ongoing high blood pressure should be treated with medicines if weight loss and exercise are not effective.  If you are 45-79 years old, ask your health care provider if you should take aspirin to prevent heart disease.  Diabetes screening involves taking a blood Wilbert to check your fasting blood sugar level. This should be done once every 3 years after age 45 if you are at a normal weight and without risk factors for diabetes. Testing should be considered at a younger age or be carried out more  frequently if you are overweight and have at least 1 risk factor for diabetes.  Colorectal cancer can be detected and often prevented. Most routine colorectal cancer screening begins at the age of 50 and continues through age 75. However, your health care provider may recommend screening at an earlier age if you have risk factors for colon cancer. On a yearly basis, your health care provider may provide home test kits to check for hidden blood in the stool. A small camera at the end of a tube may be used to directly examine the colon (sigmoidoscopy or colonoscopy) to detect the earliest forms of colorectal cancer. Talk to your health care provider about this at age 50 when routine screening begins. A direct exam of the colon should be repeated every 5-10 years through age 75, unless early forms of precancerous polyps or small growths are found.  People who are at an increased risk for hepatitis B should be screened for this virus. You are considered at high risk for hepatitis B if:  You were born in a country where hepatitis B occurs often. Talk with your health care provider about which countries are considered high risk.  Your parents were born in a high-risk country and you have not received a shot to protect against hepatitis B (hepatitis B vaccine).  You have HIV or AIDS.  You use needles to inject street drugs.  You live with, or have sex with, someone who has hepatitis B.  You are a man who has sex with other men (MSM).  You get hemodialysis treatment.  You take certain medicines for conditions like cancer, organ transplantation, and autoimmune conditions.  Hepatitis C blood testing is recommended for all people born from 1945 through 1965 and any individual with known risk factors for hepatitis C.  Healthy men should no longer receive prostate-specific antigen (PSA) blood tests as part of routine cancer screening. Talk to your health care provider about prostate cancer  screening.  Testicular cancer screening is not recommended for adolescents or adult males who have no symptoms. Screening includes self-exam, a health care provider exam, and other screening tests. Consult with your health care provider about any symptoms you have or any concerns you have about testicular cancer.  Practice safe sex. Use condoms and avoid high-risk sexual practices to reduce the spread of sexually transmitted infections (STIs).  You should be screened for STIs, including gonorrhea and chlamydia if:  You are sexually active and are younger than 24 years.  You are older than 24 years, and your health care provider tells you that you are at risk for this type of infection.  Your sexual activity has changed since you were last screened, and you are at an increased risk for chlamydia or gonorrhea. Ask your health care provider if you are at risk.  If you are at risk of being infected with HIV, it is recommended that you   take a prescription medicine daily to prevent HIV infection. This is called pre-exposure prophylaxis (PrEP). You are considered at risk if:  You are a man who has sex with other men (MSM).  You are a heterosexual man who is sexually active with multiple partners.  You take drugs by injection.  You are sexually active with a partner who has HIV.  Talk with your health care provider about whether you are at high risk of being infected with HIV. If you choose to begin PrEP, you should first be tested for HIV. You should then be tested every 3 months for as long as you are taking PrEP.  Use sunscreen. Apply sunscreen liberally and repeatedly throughout the day. You should seek shade when your shadow is shorter than you. Protect yourself by wearing long sleeves, pants, a wide-brimmed hat, and sunglasses year round whenever you are outdoors.  Tell your health care provider of new moles or changes in moles, especially if there is a change in shape or color. Also, tell  your health care provider if a mole is larger than the size of a pencil eraser.  A one-time screening for abdominal aortic aneurysm (AAA) and surgical repair of large AAAs by ultrasound is recommended for men aged 65-75 years who are current or former smokers.  Stay current with your vaccines (immunizations). Document Released: 07/10/2007 Document Revised: 01/16/2013 Document Reviewed: 06/08/2010 [employer] Patient Information 2015 [employer]. This information is not intended to replace advice given to you by your health care provider. Make sure you discuss any questions you have with your health care provider. Cardiac Diet This diet can help prevent heart disease and stroke. Many factors influence your heart health, including eating and exercise habits. Coronary risk rises a lot with abnormal blood fat (lipid) levels. Cardiac meal planning includes limiting unhealthy fats, increasing healthy fats, and making other small dietary changes. General guidelines are as follows:  Adjust calorie intake to reach and maintain desirable body weight.  Limit total fat intake to less than 30% of total calories. Saturated fat should be less than 7% of calories.  Saturated fats are found in animal products and in some vegetable products. Saturated vegetable fats are found in coconut oil, cocoa butter, palm oil, and palm kernel oil. Read labels carefully to avoid these products as much as possible. Use butter in moderation. Choose tub margarines and oils that have 2 grams of fat or less. Good cooking oils are canola and olive oils.  Practice low-fat cooking techniques. Do not fry food. Instead, broil, bake, boil, steam, grill, roast on a rack, stir-fry, or microwave it. Other fat reducing suggestions include:  Remove the skin from poultry.  Remove all visible fat from meats.  Skim the fat off stews, soups, and gravies before serving them.  Steam vegetables in water or broth instead of sauting them in  fat.  Avoid foods with trans fat (or hydrogenated oils), such as commercially fried foods and commercially baked goods. Commercial shortening and deep-frying fats will contain trans fat.  Increase intake of fruits, vegetables, whole grains, and legumes to replace foods high in fat.  Increase consumption of nuts, legumes, and seeds to at least 4 servings weekly. One serving of a legume equals  cup, and 1 serving of nuts or seeds equals  cup.  Choose whole grains more often. Have 3 servings per day (a serving is 1 ounce [oz]).  Eat 4 to 5 servings of vegetables per day. A serving of vegetables is 1   cup of raw leafy vegetables;  cup of raw or cooked cut-up vegetables;  cup of vegetable juice.  Eat 4 to 5 servings of fruit per day. A serving of fruit is 1 medium whole fruit;  cup of dried fruit;  cup of fresh, frozen, or canned fruit;  cup of 100% fruit juice.  Increase your intake of dietary fiber to 20 to 30 grams per day. Insoluble fiber may help lower your risk of heart disease and may help curb your appetite.  Soluble fiber binds cholesterol to be removed from the blood. Foods high in soluble fiber are dried beans, citrus fruits, oats, apples, bananas, broccoli, Brussels sprouts, and eggplant.  Try to include foods fortified with plant sterols or stanols, such as yogurt, breads, juices, or margarines. Choose several fortified foods to achieve a daily intake of 2 to 3 grams of plant sterols or stanols.  Foods with omega-3 fats can help reduce your risk of heart disease. Aim to have a 3.5 oz portion of fatty fish twice per week, such as salmon, mackerel, albacore tuna, sardines, lake trout, or herring. If you wish to take a fish oil supplement, choose one that contains 1 gram of both DHA and EPA.  Limit processed meats to 2 servings (3 oz portion) weekly.  Limit the sodium in your diet to 1500 milligrams (mg) per day. If you have high blood pressure, talk to a registered dietitian about  a DASH (Dietary Approaches to Stop Hypertension) eating plan.  Limit sweets and beverages with added sugar, such as soda, to no more than 5 servings per week. One serving is:   1 tablespoon sugar.  1 tablespoon jelly or jam.   cup sorbet.  1 cup lemonade.   cup regular soda. CHOOSING FOODS Starches  Allowed: Breads: All kinds (wheat, rye, raisin, white, oatmeal, Italian, French, and English muffin bread). Low-fat rolls: English muffins, frankfurter and hamburger buns, bagels, pita bread, tortillas (not fried). Pancakes, waffles, biscuits, and muffins made with recommended oil.  Avoid: Products made with saturated or trans fats, oils, or whole milk products. Butter rolls, cheese breads, croissants. Commercial doughnuts, muffins, sweet rolls, biscuits, waffles, pancakes, store-bought mixes. Crackers  Allowed: Low-fat crackers and snacks: Animal, graham, rye, saltine (with recommended oil, no lard), oyster, and matzo crackers. Bread sticks, melba toast, rusks, flatbread, pretzels, and light popcorn.  Avoid: High-fat crackers: cheese crackers, butter crackers, and those made with coconut, palm oil, or trans fat (hydrogenated oils). Buttered popcorn. Cereals  Allowed: Hot or cold whole-grain cereals.  Avoid: Cereals containing coconut, hydrogenated vegetable fat, or animal fat. Potatoes / Pasta / Rice  Allowed: All kinds of potatoes, rice, and pasta (such as macaroni, spaghetti, and noodles).  Avoid: Pasta or rice prepared with cream sauce or high-fat cheese. Chow mein noodles, French fries. Vegetables  Allowed: All vegetables and vegetable juices.  Avoid: Fried vegetables. Vegetables in cream, butter, or high-fat cheese sauces. Limit coconut. Fruit in cream or custard. Protein  Allowed: Limit your intake of meat, seafood, and poultry to no more than 6 oz (cooked weight) per day. All lean, well-trimmed beef, veal, pork, and lamb. All chicken and turkey without skin. All fish  and shellfish. Wild game: wild duck, rabbit, pheasant, and venison. Egg whites or low-cholesterol egg substitutes may be used as desired. Meatless dishes: recipes with dried beans, peas, lentils, and tofu (soybean curd). Seeds and nuts: all seeds and most nuts.  Avoid: Prime grade and other heavily marbled and fatty meats, such as short   ribs, spare ribs, rib eye roast or steak, frankfurters, sausage, bacon, and high-fat luncheon meats, mutton. Caviar. Commercially fried fish. Domestic duck, goose, venison sausage. Organ meats: liver, gizzard, heart, chitterlings, brains, kidney, sweetbreads. Dairy  Allowed: Low-fat cheeses: nonfat or low-fat cottage cheese (1% or 2% fat), cheeses made with part skim milk, such as mozzarella, farmers, string, or ricotta. (Cheeses should be labeled no more than 2 to 6 grams fat per oz.). Skim (or 1%) milk: liquid, powdered, or evaporated. Buttermilk made with low-fat milk. Drinks made with skim or low-fat milk or cocoa. Chocolate milk or cocoa made with skim or low-fat (1%) milk. Nonfat or low-fat yogurt.  Avoid: Whole milk cheeses, including colby, cheddar, muenster, Monterey Jack, Havarti, Brie, Camembert, American, Swiss, and blue. Creamed cottage cheese, cream cheese. Whole milk and whole milk products, including buttermilk or yogurt made from whole milk, drinks made from whole milk. Condensed milk, evaporated whole milk, and 2% milk. Soups and Combination Foods  Allowed: Low-fat low-sodium soups: broth, dehydrated soups, homemade broth, soups with the fat removed, homemade cream soups made with skim or low-fat milk. Low-fat spaghetti, lasagna, chili, and Spanish rice if low-fat ingredients and low-fat cooking techniques are used.  Avoid: Cream soups made with whole milk, cream, or high-fat cheese. All other soups. Desserts and Sweets  Allowed: Sherbet, fruit ices, gelatins, meringues, and angel food cake. Homemade desserts with recommended fats, oils, and milk  products. Jam, jelly, honey, marmalade, sugars, and syrups. Pure sugar candy, such as gum drops, hard candy, jelly beans, marshmallows, mints, and small amounts of dark chocolate.  Avoid: Commercially prepared cakes, pies, cookies, frosting, pudding, or mixes for these products. Desserts containing whole milk products, chocolate, coconut, lard, palm oil, or palm kernel oil. Ice cream or ice cream drinks. Candy that contains chocolate, coconut, butter, hydrogenated fat, or unknown ingredients. Buttered syrups. Fats and Oils  Allowed: Vegetable oils: safflower, sunflower, corn, soybean, cottonseed, sesame, canola, olive, or peanut. Non-hydrogenated margarines. Salad dressing or mayonnaise: homemade or commercial, made with a recommended oil. Low or nonfat salad dressing or mayonnaise.  Limit added fats and oils to 6 to 8 tsp per day (includes fats used in cooking, baking, salads, and spreads on bread). Remember to count the "hidden fats" in foods.  Avoid: Solid fats and shortenings: butter, lard, salt pork, bacon drippings. Gravy containing meat fat, shortening, or suet. Cocoa butter, coconut. Coconut oil, palm oil, palm kernel oil, or hydrogenated oils: these ingredients are often used in bakery products, nondairy creamers, whipped toppings, candy, and commercially fried foods. Read labels carefully. Salad dressings made of unknown oils, sour cream, or cheese, such as blue cheese and Roquefort. Cream, all kinds: half-and-half, light, heavy, or whipping. Sour cream or cream cheese (even if "light" or low-fat). Nondairy cream substitutes: coffee creamers and sour cream substitutes made with palm, palm kernel, hydrogenated oils, or coconut oil. Beverages  Allowed: Coffee (regular or decaffeinated), tea. Diet carbonated beverages, mineral water. Alcohol: Check with your caregiver. Moderation is recommended.  Avoid: Whole milk, regular sodas, and juice drinks with added sugar. Condiments  Allowed: All  seasonings and condiments. Cocoa powder. "Cream" sauces made with recommended ingredients.  Avoid: Carob powder made with hydrogenated fats. [facility] Breakfast   cup orange juice   cup oatmeal  1 slice toast  1 tsp margarine  1 cup skim milk Lunch  Turkey sandwich with 2 oz turkey, 2 slices bread  Lettuce and tomato slices  Fresh fruit  Carrot sticks  Coffee or   tea Snack  Fresh fruit or low-fat crackers Dinner  3 oz lean ground beef  1 baked potato  1 tsp margarine   cup asparagus  Lettuce salad  1 tbs non-creamy dressing   cup peach slices  1 cup skim milk Document Released: 10/21/2007 Document Revised: 07/13/2011 Document Reviewed: 03/13/2013 [employer] Patient Information 2015 [employer]. This information is not intended to replace advice given to you by your health care provider. Make sure you discuss any questions you have with your health care provider.  

## 2013-10-02 NOTE — Telephone Encounter (Signed)
Pt requesting referral to dermatology for evaluation of skin melanoma.  Requesting either Dr. Cardell Peach or Dr. Jenetta Loges as they are in his network directory.

## 2013-10-02 NOTE — Progress Notes (Signed)
Pre visit review using our clinic review tool, if applicable. No additional management support is needed unless otherwise documented below in the visit note. 

## 2013-10-02 NOTE — Progress Notes (Signed)
Subjective:    Patient ID: Collin [name], male    DOB: 1940-08-09, 73 y.o.   MRN: [id number]  HPI 73 year old patient who is seen today for a preventive health examination. He is followed by cardiology with stable coronary artery disease. He remains asymptomatic. He is active with golf and at times, though walks 18 holes without difficulty.   He continues to do quite well.  He does have a history of urinary frequency, but has a very liberal fluid intake.  He has a history of mildly elevated PSA intermittently.  No exertional symptoms.  Past Medical History  Diagnosis Date  . CAD (coronary artery disease)   . Hyperlipidemia   . Myocardial infarction   . BPH (benign prostatic hyperplasia)   . Diverticulosis of colon   . MYOCARDIAL INFARCTION, HX OF 07/20/2006    Qualifier: Diagnosis of  By: Leanne Chang MD, Bruce      History   Social History  . Marital Status: Married    Spouse Name: N/A    Number of Children: N/A  . Years of Education: N/A   Occupational History  . Not on file.   Social History Main Topics  . Smoking status: Never Smoker   . Smokeless tobacco: Not on file  . Alcohol Use: 0.6 oz/week    1 Glasses of wine per week  . Drug Use:   . Sexual Activity:    Other Topics Concern  . Not on file   Social History Narrative  . No narrative on file    Past Surgical History  Procedure Laterality Date  . Ptca      Family History  Problem Relation Age of Onset  . Heart disease Mother     CABG  . Aneurysm Father     AAA  . Stroke Father   . Kidney disease Father     renal failure  . Aneurysm Paternal Uncle     AAA    Allergies  Allergen Reactions  . Penicillins     REACTION: rash    Current Outpatient Prescriptions on File Prior to Visit  Medication Sig Dispense Refill  . aspirin 81 MG tablet Take 81 mg by mouth daily.        . Cholecalciferol (VITAMIN D) 2000 UNITS tablet Take 2,000 Units by mouth daily.        Marland Kitchen co-enzyme Q-10 30 MG capsule Take  30 mg by mouth daily.        . fish oil-omega-3 fatty acids 1000 MG capsule Take 2 g by mouth daily.        . Glucosamine-Chondroitin (MOVE FREE PO) Take 1 tablet by mouth daily.        . metoprolol succinate (TOPROL-XL) 25 MG 24 hr tablet Take 1 tablet (25 mg total) by mouth daily.  90 tablet  3  . Multiple Vitamin (MULTIVITAMIN) tablet Take 1 tablet by mouth daily.        . nitroGLYCERIN (NITROSTAT) 0.3 MG SL tablet PLACE 1 TABLET UNDER THE TONGUE EVERY 5 MINUTES IF NEEDED  15 tablet  1  . Resveratrol 250 MG CAPS Take 1 capsule by mouth daily.        . Saw Palmetto, Serenoa repens, 450 MG CAPS Take 1 capsule by mouth 2 (two) times daily.        . simvastatin (ZOCOR) 40 MG tablet Take 1 tablet (40 mg total) by mouth daily at 6 PM.  90 tablet  3   No current facility-administered  medications on file prior to visit.    BP 130/72  Pulse 68  Temp(Src) 98 F (36.7 C) (Oral)  Resp 20  Ht 6' 1.5" (1.867 m)  Wt 195 lb (88.451 kg)  BMI 25.38 kg/m2  SpO2 97%   1. Risk factors, based on past  M,S,F history-patient has known coronary artery disease.  Cardiovascular risk factors include hypertension, and dyslipidemia  2.  Physical activities: No activity retractions remains quite active with activities such as golf  3.  Depression/mood: No history of depression or mood disorder  4.  Hearing: No major deficits  5.  ADL's: Independent  6.  Fall risk: Low  7.  Home safety: No problems identified  8.  Height weight, and visual acuity; height weight, stable.  No change in visual acuity  9.  Counseling: Heart healthy diet, regular exercise encouraged  10. Lab orders based on risk factors: Laboratory profile reviewed  11. Referral : Followup cardiology  12. Care plan: Continue heart healthy diet and regular exercise  13. Cognitive assessment: Alert and oriented with normal affect  14. Screening: Patient has had a recent AAA screen, as well as laboratory evaluation  15. Provider List  Update: Includes cardiology.  Dermatology and GI.     Review of Systems  Constitutional: Negative for fever, chills, appetite change and fatigue.  HENT: Negative for congestion, dental problem, ear pain, hearing loss, sore throat, tinnitus, trouble swallowing and voice change.   Eyes: Negative for pain, discharge and visual disturbance.  Respiratory: Negative for cough, chest tightness, wheezing and stridor.   Cardiovascular: Negative for chest pain, palpitations and leg swelling.  Gastrointestinal: Negative for nausea, vomiting, abdominal pain, diarrhea, constipation, blood in stool and abdominal distention.  Genitourinary: Negative for urgency, hematuria, flank pain, discharge, difficulty urinating and genital sores.  Musculoskeletal: Negative for arthralgias, back pain, gait problem, joint swelling, myalgias and neck stiffness.  Skin: Negative for rash.  Neurological: Negative for dizziness, syncope, speech difficulty, weakness, numbness and headaches.  Hematological: Negative for adenopathy. Does not bruise/bleed easily.  Psychiatric/Behavioral: Negative for behavioral problems and dysphoric mood. The patient is not nervous/anxious.        Objective:   Physical Exam  Constitutional: He appears well-developed and well-nourished.  HENT:  Head: Normocephalic and atraumatic.  Right Ear: External ear normal.  Left Ear: External ear normal.  Nose: Nose normal.  Mouth/Throat: Oropharynx is clear and moist.  Eyes: Conjunctivae and EOM are normal. Pupils are equal, round, and reactive to light. No scleral icterus.  Neck: Normal range of motion. Neck supple. No JVD present. No thyromegaly present.  Cardiovascular: Regular rhythm and normal heart sounds.  Exam reveals no gallop and no friction rub.   No murmur heard. Decreased right dorsalis pedis pulse  Pulmonary/Chest: Effort normal and breath sounds normal. He exhibits no tenderness.  Abdominal: Soft. Bowel sounds are normal. He  exhibits no distension and no mass. There is no tenderness.  Genitourinary: Prostate normal and penis normal. Guaiac negative stool.  Prostate mildly enlarged  Musculoskeletal: Normal range of motion. He exhibits no edema and no tenderness.  Lymphadenopathy:    He has no cervical adenopathy.  Neurological: He is alert. He has normal reflexes. No cranial nerve deficit. Coordination normal.  Skin: Skin is warm and dry. No rash noted.  Psychiatric: He has a normal mood and affect. His behavior is normal.          Assessment & Plan:   Preventive health examination Coronary artery disease, stable.  Followup cardiology Dyslipidemia.  Continue statin therapy Hypertension.  Continue metoprolol Mild BPH  Followup cardiology Recheck here in one year or as needed Medications updated

## 2013-10-04 NOTE — Telephone Encounter (Signed)
Spoke to pt, told him order for referral to Dermatology was done and our referral coordinator will get in touch with him or there office directly regarding an appointment. Pt verbalized understanding. Pt said he made an appointment to see Cardiologist Dr. Burt Knack in Nov needs referral. Told pt will send order for referral to Cardiologist. Orders for Dermatology and Cardiology referrals done.

## 2013-10-09 ENCOUNTER — Telehealth: Payer: Self-pay | Admitting: Internal Medicine

## 2013-10-09 NOTE — Telephone Encounter (Signed)
Collin [name] w/ skin surgery cnter states [facility] is not in network.  If you get authorized. Pt will have more out of pocket, Pt appt  Is 11/3 at 3:45pm.  If this needs to be cancelled, pls let her know

## 2013-10-23 ENCOUNTER — Telehealth: Payer: Self-pay | Admitting: Internal Medicine

## 2013-10-23 DIAGNOSIS — Q828 Other specified congenital malformations of skin: Secondary | ICD-10-CM

## 2013-10-23 NOTE — Telephone Encounter (Signed)
Okay to refer to dermatology 

## 2013-10-23 NOTE — Telephone Encounter (Signed)
Spoke to pt's wife told her referral for Dermatologist was sent and someone will contact him.

## 2013-10-23 NOTE — Telephone Encounter (Signed)
Per pt call  Pt is requesting to see a dermatologist for a full body scan , pt states he does not need to be seen for the dx of  Melanoma of skin, site unspecified .Please advise . Pt requesting a call back as well ,  May leave a msg on vm if he not home per pt request .

## 2013-10-24 NOTE — Telephone Encounter (Signed)
I spoke with Collin Peach MD office  Address: [address], [place], [place] [address]  [phone] Informed pt they are in network and they are needing the referral fax  To their office so they can have a record of it before pt come in , I submitt a new referral because pt wanted to be seen for different Dx Dr changed the referral spoke with pt made aware of this

## 2013-10-26 ENCOUNTER — Telehealth: Payer: Self-pay | Admitting: Internal Medicine

## 2013-10-26 MED ORDER — SIMVASTATIN 40 MG PO TABS: 40.0000 mg | ORAL_TABLET | Freq: Every day | ORAL | Status: DC

## 2013-10-26 MED ORDER — METOPROLOL SUCCINATE ER 25 MG PO TB24: 25.0000 mg | ORAL_TABLET | Freq: Every day | ORAL | Status: DC

## 2013-10-26 NOTE — Telephone Encounter (Signed)
[place], [place] [address] is requesting re-fills on the following: metoprolol succinate (TOPROL-XL) 25 MG 24 hr tablet simvastatin (ZOCOR) 40 MG tablet

## 2013-10-26 NOTE — Telephone Encounter (Signed)
Rxs sent

## 2013-11-20 ENCOUNTER — Ambulatory Visit (INDEPENDENT_AMBULATORY_CARE_PROVIDER_SITE_OTHER): Payer: Commercial Managed Care - HMO

## 2013-11-20 DIAGNOSIS — Z23 Encounter for immunization: Secondary | ICD-10-CM

## 2013-12-11 ENCOUNTER — Telehealth: Payer: Self-pay | Admitting: Internal Medicine

## 2013-12-11 NOTE — Telephone Encounter (Signed)
Patient called very upset that he came in on 10/02/13 for his CPE. States he made no complaints and was billed for a CPE and a routine office visit. In review of his note for that date of service i do not see any complaints.

## 2013-12-11 NOTE — Telephone Encounter (Signed)
I asked billing to send a corrected claim on the patients behalf.

## 2013-12-18 ENCOUNTER — Encounter: Payer: Self-pay | Admitting: Cardiovascular Disease

## 2013-12-18 ENCOUNTER — Ambulatory Visit (INDEPENDENT_AMBULATORY_CARE_PROVIDER_SITE_OTHER): Payer: Commercial Managed Care - HMO | Admitting: Cardiovascular Disease

## 2013-12-18 VITALS — BP 150/82 | HR 61 | Ht 73.5 in | Wt 192.4 lb

## 2013-12-18 DIAGNOSIS — I2510 Atherosclerotic heart disease of native coronary artery without angina pectoris: Secondary | ICD-10-CM

## 2013-12-18 DIAGNOSIS — E785 Hyperlipidemia, unspecified: Secondary | ICD-10-CM

## 2013-12-18 NOTE — Progress Notes (Signed)
Background: The patient is followed for coronary artery disease. He presented remotely with an inferior wall MI in 1998 treated with primary angioplasty of the right coronary artery. At that time he was noted to have moderate LAD stenosis and medical therapy was recommended. He developed exertional angina in 2013. A nuclear stress test showed mild anteroapical ischemia with normal LVEF of 56%. He was started on a beta blocker with complete resolution of symptoms.  HPI:  73 year-old male presenting for follow-up evaluation. The patient is doing well. He exercises on an elliptical 3 days a week and also plays golf regularly. He carries his back for at least 9 holes. He denies any exertional chest pain or pressure. He denies dyspnea, edema, orthopnea, PND, or heart palpitations. He's been compliant with his medications.  Studies:  Lipid Panel     Component Value Date/Time   CHOL 136 09/25/2013 0829   TRIG 63.0 09/25/2013 0829   HDL 52.00 09/25/2013 0829   CHOLHDL 3 09/25/2013 0829   VLDL 12.6 09/25/2013 0829   LDLCALC 71 09/25/2013 0829     Outpatient Encounter Prescriptions as of 12/18/2013  Medication Sig  . aspirin 81 MG tablet Take 81 mg by mouth daily.    . Cholecalciferol (VITAMIN D) 2000 UNITS tablet Take 2,000 Units by mouth daily.    Marland Kitchen co-enzyme Q-10 30 MG capsule Take 30 mg by mouth daily.    . fish oil-omega-3 fatty acids 1000 MG capsule Take 2 g by mouth daily.    . Glucosamine-Chondroitin (MOVE FREE PO) Take 1 tablet by mouth daily.    . metoprolol succinate (TOPROL-XL) 25 MG 24 hr tablet Take 1 tablet (25 mg total) by mouth daily.  . Multiple Vitamin (MULTIVITAMIN) tablet Take 1 tablet by mouth daily.    . nitroGLYCERIN (NITROSTAT) 0.3 MG SL tablet PLACE 1 TABLET UNDER THE TONGUE EVERY 5 MINUTES IF NEEDED  . Resveratrol 250 MG CAPS Take 1 capsule by mouth daily.    . Saw Palmetto, Serenoa repens, 450 MG CAPS Take 1 capsule by mouth 2 (two) times daily.    . simvastatin  (ZOCOR) 40 MG tablet Take 1 tablet (40 mg total) by mouth daily at 6 PM.    Allergies  Allergen Reactions  . Penicillins     REACTION: rash    Past Medical History  Diagnosis Date  . CAD (coronary artery disease)   . Hyperlipidemia   . Myocardial infarction   . BPH (benign prostatic hyperplasia)   . Diverticulosis of colon   . MYOCARDIAL INFARCTION, HX OF 07/20/2006    Qualifier: Diagnosis of  By: Leanne Chang MD, Bruce      family history includes Aneurysm in his father and paternal uncle; Heart disease in his mother; Kidney disease in his father; Stroke in his father.   ROS: Negative except as per HPI  BP 150/82 mmHg  Pulse 61  Ht 6' 1.5" (1.867 m)  Wt 192 lb 6.4 oz (87.272 kg)  BMI 25.04 kg/m2  PHYSICAL EXAM: Pt is alert and oriented, NAD HEENT: normal Neck: JVP - normal, carotids 2+= without bruits Lungs: CTA bilaterally CV: RRR without murmur or gallop Abd: soft, NT, Positive BS, no hepatomegaly Ext: no C/C/E, distal pulses intact and equal Skin: warm/dry no rash  EKG:  Normal sinus rhythm 61 bpm, within normal limits.  ASSESSMENT AND PLAN: 1. Coronary artery disease, native vessel, without symptoms of angina. The patient will continue with medical therapy. He has responded well and has no  limitation from angina.  2. Hyperlipidemia. Recent lipid panel reviewed as detailed above. The patient takes simvastatin.  For follow-up he will return in 2 years or sooner if any problems arise.  Sherren Mocha, MD 12/18/2013 1:03 PM

## 2013-12-18 NOTE — Patient Instructions (Signed)
Your physician recommends that you schedule a follow-up appointment as needed with Dr Cooper.   Your physician recommends that you continue on your current medications as directed. Please refer to the Current Medication list given to you today.  

## 2013-12-19 ENCOUNTER — Encounter: Payer: Self-pay | Admitting: Cardiovascular Disease

## 2014-01-23 ENCOUNTER — Encounter: Payer: Self-pay | Admitting: Internal Medicine

## 2014-01-23 ENCOUNTER — Ambulatory Visit (INDEPENDENT_AMBULATORY_CARE_PROVIDER_SITE_OTHER): Payer: Commercial Managed Care - HMO | Admitting: Internal Medicine

## 2014-01-23 VITALS — BP 128/74 | Temp 97.7°F | Wt 196.0 lb

## 2014-01-23 DIAGNOSIS — M778 Other enthesopathies, not elsewhere classified: Secondary | ICD-10-CM

## 2014-01-23 DIAGNOSIS — I2510 Atherosclerotic heart disease of native coronary artery without angina pectoris: Secondary | ICD-10-CM

## 2014-01-23 DIAGNOSIS — E785 Hyperlipidemia, unspecified: Secondary | ICD-10-CM

## 2014-01-23 DIAGNOSIS — M779 Enthesopathy, unspecified: Secondary | ICD-10-CM

## 2014-01-23 MED ORDER — MELOXICAM 15 MG PO TABS: 15.0000 mg | ORAL_TABLET | Freq: Every day | ORAL | Status: DC

## 2014-01-23 NOTE — Progress Notes (Signed)
Pre visit review using our clinic review tool, if applicable. No additional management support is needed unless otherwise documented below in the visit note. 

## 2014-01-23 NOTE — Progress Notes (Signed)
Subjective:    Patient ID: Collin [name], male    DOB: Sep 24, 1940, 73 y.o.   MRN: [id number]  HPI 73 year old patient who presents with a two-week history of pain involving his right thumb and wrist area.  He states that he has some occasional chronic discomfort involving the right first MCP joint, especially with hand shaking.  For the past 2 weeks.  However, in spite of using a wrist splint has had persistent pain involving the base of the right thumb extending into the radial aspect of the wrist.  He is active with golf but has not played for approximately 2 weeks  Past Medical History  Diagnosis Date  . CAD (coronary artery disease)   . Hyperlipidemia   . Myocardial infarction   . BPH (benign prostatic hyperplasia)   . Diverticulosis of colon   . MYOCARDIAL INFARCTION, HX OF 07/20/2006    Qualifier: Diagnosis of  By: Leanne Chang MD, Bruce      History   Social History  . Marital Status: Married    Spouse Name: N/A    Number of Children: N/A  . Years of Education: N/A   Occupational History  . Not on file.   Social History Main Topics  . Smoking status: Never Smoker   . Smokeless tobacco: Not on file  . Alcohol Use: 0.6 oz/week    1 Glasses of wine per week  . Drug Use: Not on file  . Sexual Activity: Not on file   Other Topics Concern  . Not on file   Social History Narrative    Past Surgical History  Procedure Laterality Date  . Ptca      Family History  Problem Relation Age of Onset  . Heart disease Mother     CABG  . Aneurysm Father     AAA  . Stroke Father   . Kidney disease Father     renal failure  . Aneurysm Paternal Uncle     AAA    Allergies  Allergen Reactions  . Penicillins     REACTION: rash    Current Outpatient Prescriptions on File Prior to Visit  Medication Sig Dispense Refill  . aspirin 81 MG tablet Take 81 mg by mouth daily.      . Cholecalciferol (VITAMIN D) 2000 UNITS tablet Take 2,000 Units by mouth daily.      Marland Kitchen co-enzyme  Q-10 30 MG capsule Take 30 mg by mouth daily.      . fish oil-omega-3 fatty acids 1000 MG capsule Take 2 g by mouth daily.      . Glucosamine-Chondroitin (MOVE FREE PO) Take 1 tablet by mouth daily.      . metoprolol succinate (TOPROL-XL) 25 MG 24 hr tablet Take 1 tablet (25 mg total) by mouth daily. 90 tablet 3  . Multiple Vitamin (MULTIVITAMIN) tablet Take 1 tablet by mouth daily.      . nitroGLYCERIN (NITROSTAT) 0.3 MG SL tablet PLACE 1 TABLET UNDER THE TONGUE EVERY 5 MINUTES IF NEEDED 15 tablet 1  . Resveratrol 250 MG CAPS Take 1 capsule by mouth daily.      . Saw Palmetto, Serenoa repens, 450 MG CAPS Take 1 capsule by mouth 2 (two) times daily.      . simvastatin (ZOCOR) 40 MG tablet Take 1 tablet (40 mg total) by mouth daily at 6 PM. 90 tablet 3   No current facility-administered medications on file prior to visit.    BP 128/74 mmHg  Temp(Src) 97.7  F (36.5 C) (Oral)  Wt 196 lb (88.905 kg)      Review of Systems  Constitutional: Negative for fever, chills, appetite change and fatigue.  HENT: Negative for congestion, dental problem, ear pain, hearing loss, sore throat, tinnitus, trouble swallowing and voice change.   Eyes: Negative for pain, discharge and visual disturbance.  Respiratory: Negative for cough, chest tightness, wheezing and stridor.   Cardiovascular: Negative for chest pain, palpitations and leg swelling.  Gastrointestinal: Negative for nausea, vomiting, abdominal pain, diarrhea, constipation, blood in stool and abdominal distention.  Genitourinary: Negative for urgency, hematuria, flank pain, discharge, difficulty urinating and genital sores.  Musculoskeletal: Positive for arthralgias. Negative for myalgias, back pain, joint swelling, gait problem and neck stiffness.  Skin: Negative for rash.  Neurological: Negative for dizziness, syncope, speech difficulty, weakness, numbness and headaches.  Hematological: Negative for adenopathy. Does not bruise/bleed easily.    Psychiatric/Behavioral: Negative for behavioral problems and dysphoric mood. The patient is not nervous/anxious.        Objective:   Physical Exam  Constitutional: He appears well-developed and well-nourished. No distress.  Musculoskeletal:  Hypertrophic changes at the base of the right thumb tenderness along the radial aspect of the wrist          Assessment & Plan:   Tendinitis.  Will continue with the wrist splint.  Will treat with an anti-inflammatory medication for 2 weeks.  He will call at that time.  If unimproved Coronary artery disease, stable

## 2014-01-23 NOTE — Patient Instructions (Signed)
Tendon Injury Tendons are strong, cordlike structures that connect muscle to bone. Tendons are made up of woven fibers, like a rope. A tendon injury is a tear (rupture) of the tendon. The rupture may be partial (only a few of the fibers in your tendon rupture) or complete (your entire tendon ruptures). CAUSES  Tendon injuries can be caused by high-stress activities, such as sports. They also can be caused by a repetitive injury or by a single injury from an excessive, rapid force. SYMPTOMS  Symptoms of tendon injury include pain when you move the joint close to the tendon. Other symptoms are swelling, redness, and warmth. DIAGNOSIS  Tendon injuries often can be diagnosed by physical exam. However, sometimes an X-ray exam or advanced imaging, such as magnetic resonance imaging (MRI), is necessary to determine the extent of the injury. TREATMENT  Partial tendon ruptures often can be treated with immobilization. A splint, bandage, or removable brace usually is used to immobilize the injured tendon. Most injured tendons need to be immobilized for 1-2 months before they are completely healed. Complete tendon ruptures may require surgical reattachment. Document Released: 02/19/2004 Document Revised: 12/31/2010 Document Reviewed: 04/04/2011 [facility] Patient Information 2015 [place], [place]. This information is not intended to replace advice given to you by your health care provider. Make sure you discuss any questions you have with your health care provider.

## 2014-02-27 ENCOUNTER — Encounter: Payer: Self-pay | Admitting: Internal Medicine

## 2014-03-02 ENCOUNTER — Other Ambulatory Visit: Payer: Self-pay | Admitting: Cardiovascular Disease

## 2014-03-26 ENCOUNTER — Ambulatory Visit (INDEPENDENT_AMBULATORY_CARE_PROVIDER_SITE_OTHER): Payer: PPO | Admitting: Podiatry

## 2014-03-26 ENCOUNTER — Ambulatory Visit (INDEPENDENT_AMBULATORY_CARE_PROVIDER_SITE_OTHER): Payer: PPO

## 2014-03-26 ENCOUNTER — Encounter: Payer: Self-pay | Admitting: Podiatry

## 2014-03-26 VITALS — BP 115/67 | HR 59 | Resp 18

## 2014-03-26 DIAGNOSIS — M7662 Achilles tendinitis, left leg: Secondary | ICD-10-CM

## 2014-03-26 DIAGNOSIS — M79672 Pain in left foot: Secondary | ICD-10-CM | POA: Diagnosis not present

## 2014-03-26 MED ORDER — TRIAMCINOLONE ACETONIDE 10 MG/ML IJ SUSP
10.0000 mg | Freq: Once | INTRAMUSCULAR | Status: AC
  Administered 2014-03-26: 10 mg

## 2014-03-26 NOTE — Progress Notes (Signed)
Subjective:     Patient ID: Collin [name], male   DOB: Apr 11, 1940, 74 y.o.   MRN: [id number]  HPI patient points to the back of the left heel stating it's been bothering him increasingly for the last 8 months and especially recently and it is making it difficult for him to do the activities he likes or to even wear shoe gear comfortably   Review of Systems  All other systems reviewed and are negative.      Objective:   Physical Exam  Constitutional: He is oriented to person, place, and time.  Cardiovascular: Intact distal pulses.   Musculoskeletal: Normal range of motion.  Neurological: He is oriented to person, place, and time.  Skin: Skin is warm.  Nursing note and vitals reviewed.  neurovascular status intact with muscle strength adequate and range of motion subtalar midtarsal joint within normal limits. Patient's noted to have good digital perfusion is well oriented 3 and has no equinus with normal muscle strength of the Achilles tendon. He is found to have quite a bit of discomfort in the medial aspect of the left heel at the insertion of the Achilles tendon into the calcaneus with minimal central or lateral pain noted.     Assessment:     Acute Achilles tendinitis left medial side with inflammation and fluid buildup noted    Plan:     H&P and x-rays reviewed with patient. I explained risk of injection and patient wants this done understanding he will also be immobilized. I did a careful medial injection 3 mg dexamethasone Kenalog 5 mg Xylocaine and then applied air fracture walker with all instructions on usage. He will wear it full-time for 2 weeks and if he feels better gradually reduce it and I will see him in 3 weeks

## 2014-03-26 NOTE — Patient Instructions (Signed)

## 2014-04-09 ENCOUNTER — Ambulatory Visit (INDEPENDENT_AMBULATORY_CARE_PROVIDER_SITE_OTHER): Payer: PPO | Admitting: Podiatry

## 2014-04-09 ENCOUNTER — Encounter: Payer: Self-pay | Admitting: Podiatry

## 2014-04-09 VITALS — BP 129/73 | HR 60 | Resp 18

## 2014-04-09 DIAGNOSIS — M7662 Achilles tendinitis, left leg: Secondary | ICD-10-CM | POA: Diagnosis not present

## 2014-04-09 NOTE — Progress Notes (Signed)
Subjective:     Patient ID: Collin [name], male   DOB: 25-Dec-1940, 73 y.o.   MRN: [id number]  HPI patient presents stating my heel feels so much better   Review of Systems     Objective:   Physical Exam Neurovascular status intact with patient noted to have significant diminishment of discomfort posterior aspect left heel medial side with no pain upon current palpation    Assessment:     Improved Achilles tendinitis left with minimal edema noted    Plan:     Reviewed condition and at this time recommended gradual reduction in his boot usage with increase in stretching and ice therapy and to be careful with shoe gear choices. Reappoint to recheck as needed

## 2014-04-30 ENCOUNTER — Ambulatory Visit (AMBULATORY_SURGERY_CENTER): Payer: Self-pay

## 2014-04-30 VITALS — Ht 73.5 in | Wt 195.6 lb

## 2014-04-30 DIAGNOSIS — Z1211 Encounter for screening for malignant neoplasm of colon: Secondary | ICD-10-CM

## 2014-04-30 NOTE — Progress Notes (Signed)
Per pt, no allergies to soy or egg products.Pt not taking any weight loss meds or using  O2 at home. 

## 2014-05-14 ENCOUNTER — Ambulatory Visit (AMBULATORY_SURGERY_CENTER): Payer: PPO | Admitting: Internal Medicine

## 2014-05-14 ENCOUNTER — Encounter: Payer: Self-pay | Admitting: Internal Medicine

## 2014-05-14 VITALS — BP 112/72 | HR 46 | Temp 97.0°F | Resp 14 | Ht 73.0 in | Wt 195.0 lb

## 2014-05-14 DIAGNOSIS — Z1211 Encounter for screening for malignant neoplasm of colon: Secondary | ICD-10-CM | POA: Diagnosis not present

## 2014-05-14 DIAGNOSIS — D120 Benign neoplasm of cecum: Secondary | ICD-10-CM

## 2014-05-14 DIAGNOSIS — D122 Benign neoplasm of ascending colon: Secondary | ICD-10-CM | POA: Diagnosis not present

## 2014-05-14 MED ORDER — SODIUM CHLORIDE 0.9 % IV SOLN: 500.0000 mL | INTRAVENOUS | Status: DC

## 2014-05-14 NOTE — Progress Notes (Signed)
Called to room to assist during endoscopic procedure.  Patient ID and intended procedure confirmed with present staff. Received instructions for my participation in the procedure from the performing physician.  

## 2014-05-14 NOTE — Op Note (Signed)
[place]  [address] [place], [address]   COLONOSCOPY PROCEDURE REPORT  PATIENT: Collin [name], Collin [name]  MR#: [id number] BIRTHDATE: Mar 04, 1940 , 70  yrs. old GENDER: male ENDOSCOPIST: Gatha Mayer, MD, [facility] PROCEDURE DATE:  05/14/2014 PROCEDURE:   Colonoscopy, screening, Colonoscopy with biopsy, and Colonoscopy with snare polypectomy First Screening Colonoscopy - Avg.  risk and is 50 yrs.  old or older - No.  Prior Negative Screening - Now for repeat screening. 10 or more years since last screening  History of Adenoma - Now for follow-up colonoscopy & has been > or = to 3 yrs.  N/A ASA CLASS:   Class III INDICATIONS:Screening for colonic neoplasia and Colorectal Neoplasm Risk Assessment for this procedure is average risk. MEDICATIONS: Propofol 250 mg IV and Monitored anesthesia care  DESCRIPTION OF PROCEDURE:   After the risks benefits and alternatives of the procedure were thoroughly explained, informed consent was obtained.  The digital rectal exam revealed no abnormalities of the rectum and revealed no prostatic nodules. The [id number]  endoscope was introduced through the anus and advanced to the cecum, which was identified by both the appendix and ileocecal valve. No adverse events experienced.   The quality of the prep was good.  (MiraLax was used)  The instrument was then slowly withdrawn as the colon was fully examined.   COLON FINDINGS: Four sessile polyps ranging from 2 to 45mm in size were found at the cecum and in the ascending colon.  Polypectomies were performed with cold forceps and with a cold snare.  The resection was complete, the polyp tissue was completely retrieved and sent to histology.  Scattered diverticulosis in colon, oherwise normal. Retroflexed views revealed internal hemorrhoids. The time to cecum = 3.6 Withdrawal time = 16.0   The scope was withdrawn and the procedure completed. COMPLICATIONS: There were no immediate  complications.  ENDOSCOPIC IMPRESSION: Four sessile polyps ranging from 2 to 45mm in size were found at the cecum and in the ascending colon; polypectomies were performed with cold forceps and with a cold snare Scattered diverticulosis Internal hemorrhoids Otherwise normal - good prep  RECOMMENDATIONS: 1.  Timing of repeat colonoscopy will be determined by pathology findings. 2.  Hemorrhoid banding if desired  eSigned:  Gatha Mayer, MD, [facility] 05/14/2014 9:40 AM   cc: The Patient

## 2014-05-14 NOTE — Patient Instructions (Addendum)
I found and removed 4 polyps today.  I will let you know pathology results and when/if to have another routine colonoscopy by mail.  You also have a condition called diverticulosis - common and not usually a problem. Please read the handout provided.  I also saw hemorrhoids. If you have hemorrhoid problems (swelling, itching, bleeding) I am able to treat those with an in-office procedure. If you like, please call my office at [phone] to schedule an appointment and I can evaluate you further.  I appreciate the opportunity to care for you. Gatha Mayer, MD, FACG  YOU HAD AN ENDOSCOPIC PROCEDURE TODAY AT [facility]:   Refer to the procedure report that was given to you for any specific questions about what was found during the examination.  If the procedure report does not answer your questions, please call your gastroenterologist to clarify.  If you requested that your care partner not be given the details of your procedure findings, then the procedure report has been included in a sealed envelope for you to review at your convenience later.  YOU SHOULD EXPECT: Some feelings of bloating in the abdomen. Passage of more gas than usual.  Walking can help get rid of the air that was put into your GI tract during the procedure and reduce the bloating. If you had a lower endoscopy (such as a colonoscopy or flexible sigmoidoscopy) you may notice spotting of blood in your stool or on the toilet paper. If you underwent a bowel prep for your procedure, you may not have a normal bowel movement for a few days.  Please Note:  You might notice some irritation and congestion in your nose or some drainage.  This is from the oxygen used during your procedure.  There is no need for concern and it should clear up in a day or so.  SYMPTOMS TO REPORT IMMEDIATELY:   Following lower endoscopy (colonoscopy or flexible sigmoidoscopy):  Excessive amounts of blood in the stool  Significant  tenderness or worsening of abdominal pains  Swelling of the abdomen that is new, acute  Fever of 100F or higher    For urgent or emergent issues, a gastroenterologist can be reached at any hour by calling [phone].   DIET: Your first meal following the procedure should be a small meal and then it is ok to progress to your normal diet. Heavy or fried foods are harder to digest and may make you feel nauseous or bloated.  Likewise, meals heavy in dairy and vegetables can increase bloating.  Drink plenty of fluids but you should avoid alcoholic beverages for 24 hours.  ACTIVITY:  You should plan to take it easy for the rest of today and you should NOT DRIVE or use heavy machinery until tomorrow (because of the sedation medicines used during the test).    FOLLOW UP: Our staff will call the number listed on your records the next business day following your procedure to check on you and address any questions or concerns that you may have regarding the information given to you following your procedure. If we do not reach you, we will leave a message.  However, if you are feeling well and you are not experiencing any problems, there is no need to return our call.  We will assume that you have returned to your regular daily activities without incident.  If any biopsies were taken you will be contacted by phone or by letter within the next 1-3 weeks.  Please call us at [phone] if you have not heard about the biopsies in 3 weeks.    SIGNATURES/CONFIDENTIALITY: You and/or your care partner have signed paperwork which will be entered into your electronic medical record.  These signatures attest to the fact that that the information above on your After Visit Summary has been reviewed and is understood.  Full responsibility of the confidentiality of this discharge information lies with you and/or your care-partner.  Polyps, diverticulosis, hemorrhoids, and hemorrhoid banding informaton  given.  Dr. Carlean Purl will advise you about next colonodscopy.

## 2014-05-14 NOTE — Progress Notes (Signed)
Patient awakening,vss,report to rn 

## 2014-05-15 ENCOUNTER — Telehealth: Payer: Self-pay

## 2014-05-15 NOTE — Telephone Encounter (Signed)
  Follow up Call-  Call back number 05/14/2014  Post procedure Call Back phone  # [phone]  Permission to leave phone message Yes     Patient questions:  Do you have a fever, pain , or abdominal swelling? No. Pain Score  0 *  Have you tolerated food without any problems? Yes.    Have you been able to return to your normal activities? Yes.    Do you have any questions about your discharge instructions: Diet   No. Medications  No. Follow up visit  No.  Do you have questions or concerns about your Care? No.  Actions: * If pain score is 4 or above: No action needed, pain <4.

## 2014-05-22 ENCOUNTER — Encounter: Payer: Self-pay | Admitting: Internal Medicine

## 2014-05-22 DIAGNOSIS — Z860101 Personal history of adenomatous and serrated colon polyps: Secondary | ICD-10-CM

## 2014-05-22 DIAGNOSIS — Z86010 Personal history of colonic polyps: Secondary | ICD-10-CM

## 2014-05-22 HISTORY — DX: Personal history of adenomatous and serrated colon polyps: Z86.0101

## 2014-05-22 HISTORY — DX: Personal history of colonic polyps: Z86.010

## 2014-05-22 NOTE — Progress Notes (Signed)
Quick Note:  4 diminutive adenomas Consider repeating colonoscopy in 2019 ______

## 2014-06-10 ENCOUNTER — Encounter: Payer: Self-pay | Admitting: Podiatry

## 2014-06-10 ENCOUNTER — Ambulatory Visit (INDEPENDENT_AMBULATORY_CARE_PROVIDER_SITE_OTHER): Payer: PPO | Admitting: Podiatry

## 2014-06-10 VITALS — BP 130/71 | HR 53 | Resp 15

## 2014-06-10 DIAGNOSIS — M7662 Achilles tendinitis, left leg: Secondary | ICD-10-CM | POA: Diagnosis not present

## 2014-06-10 DIAGNOSIS — M722 Plantar fascial fibromatosis: Secondary | ICD-10-CM | POA: Diagnosis not present

## 2014-06-10 MED ORDER — TRIAMCINOLONE ACETONIDE 10 MG/ML IJ SUSP
10.0000 mg | Freq: Once | INTRAMUSCULAR | Status: AC
  Administered 2014-06-10: 10 mg

## 2014-06-11 NOTE — Progress Notes (Signed)
Subjective:     Patient ID: Collin [name], male   DOB: February 02, 1940, 74 y.o.   MRN: [id number]  HPI patient presents stating the back of my heel seems to be doing okay but it can hurt occasionally and I been getting pain in the bottom of my heel that I wanted to get checked he states is going out of town the end of June   Review of Systems     Objective:   Physical Exam Neurovascular status intact muscle strength adequate range of motion within normal limits. Patient has mild posterior pain but moderate plantar pain at the insertion of the tendon into the calcaneus with fluid mostly in the central band    Assessment:     Achilles tendinitis still present but improved with moderate plantar fascial symptomatology    Plan:     Did do a central band injection 3 Milligan Kenalog 5 g Xylocaine and applied fascial brace with instructions on usage. He will continue to use his boot on occasional basis and will be checked back by [place] if symptoms do not get better the next several weeks

## 2014-08-01 ENCOUNTER — Telehealth: Payer: Self-pay | Admitting: *Deleted

## 2014-08-01 NOTE — Telephone Encounter (Signed)
He can have pt

## 2014-08-01 NOTE — Telephone Encounter (Addendum)
Pt states he has had a series of cortisone injections and is better, but not well.  Pt request PT, has contacted his insurance and they will cover if referred by Dr. Paulla Dolly.  Left message asking pt to call with preferred PT facility.  Pt states he would like to take Physical Therapy at [facility].  DR. Paulla Dolly ordered evaluate and treat pt for left foot plantar fasciitis and achilles tendonitis focusing on flexibility, mobility and decrease pain.  Orders faxed to [facility] [phone] and pt was informed.

## 2014-09-18 DIAGNOSIS — M79673 Pain in unspecified foot: Secondary | ICD-10-CM

## 2014-09-19 ENCOUNTER — Telehealth: Payer: Self-pay | Admitting: Podiatry

## 2014-09-19 NOTE — Telephone Encounter (Signed)
Called and he is going to pick up his medical records

## 2014-10-03 ENCOUNTER — Other Ambulatory Visit (INDEPENDENT_AMBULATORY_CARE_PROVIDER_SITE_OTHER): Payer: PPO

## 2014-10-03 DIAGNOSIS — Z0000 Encounter for general adult medical examination without abnormal findings: Secondary | ICD-10-CM

## 2014-10-03 LAB — BASIC METABOLIC PANEL
BUN: 17 mg/dL (ref 6–23)
CALCIUM: 9.5 mg/dL (ref 8.4–10.5)
CO2: 32 mEq/L (ref 19–32)
CREATININE: 1.22 mg/dL (ref 0.40–1.50)
Chloride: 105 mEq/L (ref 96–112)
GFR: 61.34 mL/min (ref >60.00)
Glucose, Bld: 102 mg/dL — ABNORMAL HIGH (ref 70–99)
Potassium: 5.2 mEq/L — ABNORMAL HIGH (ref 3.5–5.1)
Sodium: 141 mEq/L (ref 135–145)

## 2014-10-03 LAB — POCT URINALYSIS DIPSTICK
BILIRUBIN UA: NEGATIVE
Blood, UA: NEGATIVE
Glucose, UA: NEGATIVE
Ketones, UA: NEGATIVE
LEUKOCYTES UA: NEGATIVE
NITRITE UA: NEGATIVE
PH UA: 6
PROTEIN UA: NEGATIVE
Spec Grav, UA: 1.015
Urobilinogen, UA: 0.2

## 2014-10-03 LAB — LIPID PANEL
Cholesterol: 133 mg/dL (ref 0–200)
HDL: 53.8 mg/dL (ref >39.00)
LDL Cholesterol: 63 mg/dL (ref 0–99)
NonHDL: 78.89
Total CHOL/HDL Ratio: 2
Triglycerides: 78 mg/dL (ref 0.0–149.0)
VLDL: 15.6 mg/dL (ref 0.0–40.0)

## 2014-10-03 LAB — CBC WITH DIFFERENTIAL/PLATELET
BASOS ABS: 0 10*3/uL (ref 0.0–0.1)
BASOS PCT: 0.5 % (ref 0.0–3.0)
EOS ABS: 0.3 10*3/uL (ref 0.0–0.7)
Eosinophils Relative: 4.9 % (ref 0.0–5.0)
HEMATOCRIT: 45.5 % (ref 39.0–52.0)
HEMOGLOBIN: 15.1 g/dL (ref 13.0–17.0)
LYMPHS PCT: 42.6 % (ref 12.0–46.0)
Lymphs Abs: 2.9 10*3/uL (ref 0.7–4.0)
MCHC: 33.1 g/dL (ref 30.0–36.0)
MCV: 87.9 fl (ref 78.0–100.0)
MONO ABS: 0.7 10*3/uL (ref 0.1–1.0)
Monocytes Relative: 10.7 % (ref 3.0–12.0)
Neutro Abs: 2.8 10*3/uL (ref 1.4–7.7)
Neutrophils Relative %: 41.3 % — ABNORMAL LOW (ref 43.0–77.0)
Platelets: 215 10*3/uL (ref 150.0–400.0)
RBC: 5.18 Mil/uL (ref 4.22–5.81)
RDW: 13.6 % (ref 11.5–15.5)
WBC: 6.9 10*3/uL (ref 4.0–10.5)

## 2014-10-03 LAB — HEPATIC FUNCTION PANEL
ALT: 23 U/L (ref 0–53)
AST: 26 U/L (ref 0–37)
Albumin: 4.2 g/dL (ref 3.5–5.2)
Alkaline Phosphatase: 46 U/L (ref 39–117)
Bilirubin, Direct: 0.2 mg/dL (ref 0.0–0.3)
Total Bilirubin: 0.8 mg/dL (ref 0.2–1.2)
Total Protein: 6.8 g/dL (ref 6.0–8.3)

## 2014-10-03 LAB — PSA: PSA: 5.36 ng/mL — ABNORMAL HIGH (ref 0.10–4.00)

## 2014-10-03 LAB — TSH: TSH: 2.29 u[IU]/mL (ref 0.35–4.50)

## 2014-10-08 ENCOUNTER — Ambulatory Visit (INDEPENDENT_AMBULATORY_CARE_PROVIDER_SITE_OTHER): Payer: PPO | Admitting: Internal Medicine

## 2014-10-08 ENCOUNTER — Encounter: Payer: Self-pay | Admitting: Internal Medicine

## 2014-10-08 VITALS — BP 120/70 | HR 60 | Temp 97.8°F | Ht 73.43 in | Wt 192.0 lb

## 2014-10-08 DIAGNOSIS — Z23 Encounter for immunization: Secondary | ICD-10-CM

## 2014-10-08 DIAGNOSIS — R972 Elevated prostate specific antigen [PSA]: Secondary | ICD-10-CM

## 2014-10-08 DIAGNOSIS — E785 Hyperlipidemia, unspecified: Secondary | ICD-10-CM | POA: Diagnosis not present

## 2014-10-08 DIAGNOSIS — Z0000 Encounter for general adult medical examination without abnormal findings: Secondary | ICD-10-CM | POA: Diagnosis not present

## 2014-10-08 DIAGNOSIS — I2510 Atherosclerotic heart disease of native coronary artery without angina pectoris: Secondary | ICD-10-CM

## 2014-10-08 NOTE — Progress Notes (Signed)
Subjective:    Patient ID: Collin [name], male    DOB: November 03, 1940, 74 y.o.   MRN: [id number]  HPI  58 -year-old patient who is seen today for a preventive health examination. He is followed by cardiology with stable coronary artery disease. He remains stable. He continues to do quite well.  He does have a history of urinary frequency, but has a very liberal fluid intake.  He has a history of mildly elevated PSA intermittently.  No exertional symptoms.  He has been followed closely.  Orthopedics due to left foot pain.  This has interfered with his golf activities  Past Medical History  Diagnosis Date  . CAD (coronary artery disease)   . Hyperlipidemia   . BPH (benign prostatic hyperplasia)   . Diverticulosis of colon   . MYOCARDIAL INFARCTION, HX OF 07/20/2006    Qualifier: Diagnosis of  By: Leanne Chang MD, Bruce    . Hypertension   . Allergy   . Cataract   . Hx of adenomatous colonic polyps 05/22/2014    Social History   Social History  . Marital Status: Married    Spouse Name: N/A  . Number of Children: N/A  . Years of Education: N/A   Occupational History  . Not on file.   Social History Main Topics  . Smoking status: Never Smoker   . Smokeless tobacco: Never Used  . Alcohol Use: 0.6 oz/week    1 Glasses of wine per week  . Drug Use: No  . Sexual Activity: Not on file   Other Topics Concern  . Not on file   Social History Narrative    Past Surgical History  Procedure Laterality Date  . Ptca    . Cataract surg      Bil  . Colonoscopy      Family History  Problem Relation Age of Onset  . Heart disease Mother     CABG  . Aneurysm Father     AAA  . Stroke Father   . Kidney disease Father     renal failure  . Aneurysm Paternal Uncle     AAA  . Hypertension Sister   . Diabetes Brother   . Colon cancer Neg Hx     Allergies  Allergen Reactions  . Penicillins     REACTION: rash    Current Outpatient Prescriptions on File Prior to Visit    Medication Sig Dispense Refill  . aspirin 81 MG tablet Take 81 mg by mouth daily.      . Cholecalciferol (VITAMIN D) 2000 UNITS tablet Take 4,000 Units by mouth daily.     Marland Kitchen co-enzyme Q-10 30 MG capsule Take 30 mg by mouth daily.      . fish oil-omega-3 fatty acids 1000 MG capsule Take 2 g by mouth daily.      . Glucosamine-Chondroitin (MOVE FREE PO) Take 1 tablet by mouth daily.      . metoprolol succinate (TOPROL-XL) 25 MG 24 hr tablet Take 1 tablet (25 mg total) by mouth daily. 90 tablet 3  . Multiple Vitamin (MULTIVITAMIN) tablet Take 1 tablet by mouth daily.      . nitroGLYCERIN (NITROSTAT) 0.3 MG SL tablet PLACE 1 TABLET UNDER THE TONGUE EVERY 5 MINUTES IF NEEDED 15 tablet 1  . Resveratrol 250 MG CAPS Take 1 capsule by mouth daily.      . Saw Palmetto, Serenoa repens, 450 MG CAPS Take 1 capsule by mouth 2 (two) times daily.      Marland Kitchen  simvastatin (ZOCOR) 40 MG tablet Take 1 tablet (40 mg total) by mouth daily at 6 PM. 90 tablet 3   No current facility-administered medications on file prior to visit.    BP 120/70 mmHg  Pulse 60  Temp(Src) 97.8 F (36.6 C) (Oral)  Ht 6' 1.43" (1.865 m)  Wt 192 lb (87.091 kg)  BMI 25.04 kg/m2  SpO2 95%   1. Risk factors, based on past  M,S,F history-patient has known coronary artery disease.  Cardiovascular risk factors include hypertension, and dyslipidemia  2.  Physical activities: No activity retractions remains quite active with activities such as golf  3.  Depression/mood: No history of depression or mood disorder  4.  Hearing: Minor deficits only  5.  ADL's: Independent  6.  Fall risk: Low  7.  Home safety: No problems identified  8.  Height weight, and visual acuity; height weight, stable.  No change in visual acuity.  Is followed by ophthalmology annually.  Glaucoma suspect  9.  Counseling: Heart healthy diet, regular exercise encouraged  10. Lab orders based on risk factors: Laboratory profile reviewed  11. Referral : Followup  cardiology  12. Care plan: Continue heart healthy diet and regular exercise  13. Cognitive assessment: Alert and oriented with normal affect  14. Screening: Patient has had a recent AAA screen, as well as laboratory evaluation.  We'll continue annual health examinations with screening lab.  Patient has had a recent colonoscopy this year which did reveal colonic polyps yet actually  15. Provider List Update: Includes cardiology.  Dermatology and GI.     Review of Systems  Constitutional: Negative for fever, chills, appetite change and fatigue.  HENT: Negative for congestion, dental problem, ear pain, hearing loss, sore throat, tinnitus, trouble swallowing and voice change.   Eyes: Negative for pain, discharge and visual disturbance.  Respiratory: Negative for cough, chest tightness, wheezing and stridor.   Cardiovascular: Negative for chest pain, palpitations and leg swelling.  Gastrointestinal: Negative for nausea, vomiting, abdominal pain, diarrhea, constipation, blood in stool and abdominal distention.  Genitourinary: Negative for urgency, hematuria, flank pain, discharge, difficulty urinating and genital sores.  Musculoskeletal: Negative for myalgias, back pain, joint swelling, arthralgias, gait problem and neck stiffness.  Skin: Negative for rash.  Neurological: Negative for dizziness, syncope, speech difficulty, weakness, numbness and headaches.  Hematological: Negative for adenopathy. Does not bruise/bleed easily.  Psychiatric/Behavioral: Negative for behavioral problems and dysphoric mood. The patient is not nervous/anxious.        Objective:   Physical Exam  Constitutional: He appears well-developed and well-nourished.  HENT:  Head: Normocephalic and atraumatic.  Right Ear: External ear normal.  Left Ear: External ear normal.  Nose: Nose normal.  Mouth/Throat: Oropharynx is clear and moist.  Eyes: Conjunctivae and EOM are normal. Pupils are equal, round, and reactive to  light. No scleral icterus.  Neck: Normal range of motion. Neck supple. No JVD present. No thyromegaly present.  Cardiovascular: Regular rhythm and normal heart sounds.  Exam reveals no gallop and no friction rub.   No murmur heard. Decreased right dorsalis pedis pulse  Pulmonary/Chest: Effort normal and breath sounds normal. He exhibits no tenderness.  Abdominal: Soft. Bowel sounds are normal. He exhibits no distension and no mass. There is no tenderness.  Genitourinary: Prostate normal and penis normal. Guaiac negative stool.  Prostate mildly enlarged  Musculoskeletal: Normal range of motion. He exhibits no edema or tenderness.  High arches  Lymphadenopathy:    He has no cervical adenopathy.  Neurological: He is alert. He has normal reflexes. No cranial nerve deficit. Coordination normal.  Decreased vibratory sensation lower extremities  Skin: Skin is warm and dry. No rash noted.  Psychiatric: He has a normal mood and affect. His behavior is normal.          Assessment & Plan:   Preventive health examination Coronary artery disease, stable.  Followup cardiology Dyslipidemia.  Continue statin therapy Hypertension.  Continue metoprolol Mild BPH  Followup cardiology Recheck here in one year or as needed Medications updated

## 2014-10-08 NOTE — Progress Notes (Signed)
Pre visit review using our clinic review tool, if applicable. No additional management support is needed unless otherwise documented below in the visit note. 

## 2014-10-08 NOTE — Patient Instructions (Signed)
Limit your sodium (Salt) intake    It is important that you exercise regularly, at least 20 minutes 3 to 4 times per week.  If you develop chest pain or shortness of breath seek  medical attention.  Return in one year for follow-up  Health Maintenance A healthy lifestyle and preventative care can promote health and wellness.  Maintain regular health, dental, and eye exams.  Eat a healthy diet. Foods like vegetables, fruits, whole grains, low-fat dairy products, and lean protein foods contain the nutrients you need and are low in calories. Decrease your intake of foods high in solid fats, added sugars, and salt. Get information about a proper diet from your health care provider, if necessary.  Regular physical exercise is one of the most important things you can do for your health. Most adults should get at least 150 minutes of moderate-intensity exercise (any activity that increases your heart rate and causes you to sweat) each week. In addition, most adults need muscle-strengthening exercises on 2 or more days a week.   Maintain a healthy weight. The body mass index (BMI) is a screening tool to identify possible weight problems. It provides an estimate of body fat based on height and weight. Your health care provider can find your BMI and can help you achieve or maintain a healthy weight. For males 20 years and older:  A BMI below 18.5 is considered underweight.  A BMI of 18.5 to 24.9 is normal.  A BMI of 25 to 29.9 is considered overweight.  A BMI of 30 and above is considered obese.  Maintain normal blood lipids and cholesterol by exercising and minimizing your intake of saturated fat. Eat a balanced diet with plenty of fruits and vegetables. Blood tests for lipids and cholesterol should begin at age 20 and be repeated every 5 years. If your lipid or cholesterol levels are high, you are over age 50, or you are at high risk for heart disease, you may need your cholesterol levels checked  more frequently.Ongoing high lipid and cholesterol levels should be treated with medicines if diet and exercise are not working.  If you smoke, find out from your health care provider how to quit. If you do not use tobacco, do not start.  Lung cancer screening is recommended for adults aged 55-80 years who are at high risk for developing lung cancer because of a history of smoking. A yearly low-dose CT scan of the lungs is recommended for people who have at least a 30-pack-year history of smoking and are current smokers or have quit within the past 15 years. A pack year of smoking is smoking an average of 1 pack of cigarettes a day for 1 year (for example, a 30-pack-year history of smoking could mean smoking 1 pack a day for 30 years or 2 packs a day for 15 years). Yearly screening should continue until the smoker has stopped smoking for at least 15 years. Yearly screening should be stopped for people who develop a health problem that would prevent them from having lung cancer treatment.  If you choose to drink alcohol, do not have more than 2 drinks per day. One drink is considered to be 12 oz (360 mL) of beer, 5 oz (150 mL) of wine, or 1.5 oz (45 mL) of liquor.  Avoid the use of street drugs. Do not share needles with anyone. Ask for help if you need support or instructions about stopping the use of drugs.  High blood pressure causes heart   disease and increases the risk of stroke. Blood pressure should be checked at least every 1-2 years. Ongoing high blood pressure should be treated with medicines if weight loss and exercise are not effective.  If you are 53-34 years old, ask your health care provider if you should take aspirin to prevent heart disease.  Diabetes screening involves taking a blood Rzepka to check your fasting blood sugar level. This should be done once every 3 years after age 36 if you are at a normal weight and without risk factors for diabetes. Testing should be considered at a  younger age or be carried out more frequently if you are overweight and have at least 1 risk factor for diabetes.  Colorectal cancer can be detected and often prevented. Most routine colorectal cancer screening begins at the age of 92 and continues through age 46. However, your health care provider may recommend screening at an earlier age if you have risk factors for colon cancer. On a yearly basis, your health care provider may provide home test kits to check for hidden blood in the stool. A small camera at the end of a tube may be used to directly examine the colon (sigmoidoscopy or colonoscopy) to detect the earliest forms of colorectal cancer. Talk to your health care provider about this at age 64 when routine screening begins. A direct exam of the colon should be repeated every 5-10 years through age 66, unless early forms of precancerous polyps or small growths are found.  People who are at an increased risk for hepatitis B should be screened for this virus. You are considered at high risk for hepatitis B if:  You were born in a country where hepatitis B occurs often. Talk with your health care provider about which countries are considered high risk.  Your parents were born in a high-risk country and you have not received a shot to protect against hepatitis B (hepatitis B vaccine).  You have HIV or AIDS.  You use needles to inject street drugs.  You live with, or have sex with, someone who has hepatitis B.  You are a man who has sex with other men (MSM).  You get hemodialysis treatment.  You take certain medicines for conditions like cancer, organ transplantation, and autoimmune conditions.  Hepatitis C blood testing is recommended for all people born from [birth year] through 1967 and any individual with known risk factors for hepatitis C.  Healthy men should no longer receive prostate-specific antigen (PSA) blood tests as part of routine cancer screening. Talk to your health care provider  about prostate cancer screening.  Testicular cancer screening is not recommended for adolescents or adult males who have no symptoms. Screening includes self-exam, a health care provider exam, and other screening tests. Consult with your health care provider about any symptoms you have or any concerns you have about testicular cancer.  Practice safe sex. Use condoms and avoid high-risk sexual practices to reduce the spread of sexually transmitted infections (STIs).  You should be screened for STIs, including gonorrhea and chlamydia if:  You are sexually active and are younger than 24 years.  You are older than 24 years, and your health care provider tells you that you are at risk for this type of infection.  Your sexual activity has changed since you were last screened, and you are at an increased risk for chlamydia or gonorrhea. Ask your health care provider if you are at risk.  If you are at risk of being infected  with HIV, it is recommended that you take a prescription medicine daily to prevent HIV infection. This is called pre-exposure prophylaxis (PrEP). You are considered at risk if:  You are a man who has sex with other men (MSM).  You are a heterosexual man who is sexually active with multiple partners.  You take drugs by injection.  You are sexually active with a partner who has HIV.  Talk with your health care provider about whether you are at high risk of being infected with HIV. If you choose to begin PrEP, you should first be tested for HIV. You should then be tested every 3 months for as long as you are taking PrEP.  Use sunscreen. Apply sunscreen liberally and repeatedly throughout the day. You should seek shade when your shadow is shorter than you. Protect yourself by wearing long sleeves, pants, a wide-brimmed hat, and sunglasses year round whenever you are outdoors.  Tell your health care provider of new moles or changes in moles, especially if there is a change in shape  or color. Also, tell your health care provider if a mole is larger than the size of a pencil eraser.  A one-time screening for abdominal aortic aneurysm (AAA) and surgical repair of large AAAs by ultrasound is recommended for men aged 65-75 years who are current or former smokers.  Stay current with your vaccines (immunizations). Document Released: 07/10/2007 Document Revised: 01/16/2013 Document Reviewed: 06/08/2010 [employer] Patient Information 2015 [employer]. This information is not intended to replace advice given to you by your health care provider. Make sure you discuss any questions you have with your health care provider.  

## 2015-02-05 DIAGNOSIS — D0471 Carcinoma in situ of skin of right lower limb, including hip: Secondary | ICD-10-CM | POA: Diagnosis not present

## 2015-02-05 DIAGNOSIS — Z23 Encounter for immunization: Secondary | ICD-10-CM | POA: Diagnosis not present

## 2015-02-14 ENCOUNTER — Encounter: Payer: Self-pay | Admitting: Cardiovascular Disease

## 2015-02-14 ENCOUNTER — Encounter: Payer: Self-pay | Admitting: Cardiology

## 2015-02-24 ENCOUNTER — Other Ambulatory Visit: Payer: Self-pay | Admitting: Cardiovascular Disease

## 2015-03-25 DIAGNOSIS — H40053 Ocular hypertension, bilateral: Secondary | ICD-10-CM | POA: Diagnosis not present

## 2015-03-25 DIAGNOSIS — H532 Diplopia: Secondary | ICD-10-CM | POA: Diagnosis not present

## 2015-09-16 DIAGNOSIS — H532 Diplopia: Secondary | ICD-10-CM | POA: Diagnosis not present

## 2015-09-23 DIAGNOSIS — H40011 Open angle with borderline findings, low risk, right eye: Secondary | ICD-10-CM | POA: Diagnosis not present

## 2015-09-23 DIAGNOSIS — H40012 Open angle with borderline findings, low risk, left eye: Secondary | ICD-10-CM | POA: Diagnosis not present

## 2015-10-08 ENCOUNTER — Other Ambulatory Visit (INDEPENDENT_AMBULATORY_CARE_PROVIDER_SITE_OTHER): Payer: PPO

## 2015-10-08 DIAGNOSIS — Z0000 Encounter for general adult medical examination without abnormal findings: Secondary | ICD-10-CM

## 2015-10-08 LAB — POC URINALSYSI DIPSTICK (AUTOMATED)
Bilirubin, UA: NEGATIVE
GLUCOSE UA: NEGATIVE
Ketones, UA: NEGATIVE
Leukocytes, UA: NEGATIVE
NITRITE UA: NEGATIVE
PH UA: 6
Protein, UA: NEGATIVE
RBC UA: NEGATIVE
Spec Grav, UA: 1.015
UROBILINOGEN UA: 0.2

## 2015-10-08 LAB — CBC WITH DIFFERENTIAL/PLATELET
BASOS ABS: 0 10*3/uL (ref 0.0–0.1)
Basophils Relative: 0.4 % (ref 0.0–3.0)
EOS ABS: 0.4 10*3/uL (ref 0.0–0.7)
Eosinophils Relative: 4.9 % (ref 0.0–5.0)
HEMATOCRIT: 46.5 % (ref 39.0–52.0)
Hemoglobin: 15.6 g/dL (ref 13.0–17.0)
LYMPHS ABS: 3.2 10*3/uL (ref 0.7–4.0)
LYMPHS PCT: 42.1 % (ref 12.0–46.0)
MCHC: 33.7 g/dL (ref 30.0–36.0)
MCV: 86.1 fl (ref 78.0–100.0)
MONO ABS: 0.8 10*3/uL (ref 0.1–1.0)
Monocytes Relative: 10.1 % (ref 3.0–12.0)
NEUTROS ABS: 3.2 10*3/uL (ref 1.4–7.7)
NEUTROS PCT: 42.5 % — AB (ref 43.0–77.0)
PLATELETS: 212 10*3/uL (ref 150.0–400.0)
RBC: 5.4 Mil/uL (ref 4.22–5.81)
RDW: 13.4 % (ref 11.5–15.5)
WBC: 7.6 10*3/uL (ref 4.0–10.5)

## 2015-10-08 LAB — HEPATIC FUNCTION PANEL
ALK PHOS: 44 U/L (ref 39–117)
ALT: 24 U/L (ref 0–53)
AST: 25 U/L (ref 0–37)
Albumin: 4 g/dL (ref 3.5–5.2)
BILIRUBIN DIRECT: 0.3 mg/dL (ref 0.0–0.3)
BILIRUBIN TOTAL: 0.8 mg/dL (ref 0.2–1.2)
Total Protein: 7 g/dL (ref 6.0–8.3)

## 2015-10-08 LAB — LIPID PANEL
Cholesterol: 147 mg/dL (ref 0–200)
HDL: 55.1 mg/dL (ref >39.00)
LDL Cholesterol: 75 mg/dL (ref 0–99)
NONHDL: 91.75
Total CHOL/HDL Ratio: 3
Triglycerides: 82 mg/dL (ref 0.0–149.0)
VLDL: 16.4 mg/dL (ref 0.0–40.0)

## 2015-10-08 LAB — BASIC METABOLIC PANEL
BUN: 16 mg/dL (ref 6–23)
CALCIUM: 9.1 mg/dL (ref 8.4–10.5)
CO2: 28 meq/L (ref 19–32)
CREATININE: 1.24 mg/dL (ref 0.40–1.50)
Chloride: 104 mEq/L (ref 96–112)
GFR: 60.04 mL/min (ref >60.00)
Glucose, Bld: 92 mg/dL (ref 70–99)
Potassium: 4.2 mEq/L (ref 3.5–5.1)
Sodium: 141 mEq/L (ref 135–145)

## 2015-10-08 LAB — TSH: TSH: 2.85 u[IU]/mL (ref 0.35–4.50)

## 2015-10-14 ENCOUNTER — Ambulatory Visit (INDEPENDENT_AMBULATORY_CARE_PROVIDER_SITE_OTHER): Payer: PPO | Admitting: Internal Medicine

## 2015-10-14 ENCOUNTER — Encounter: Payer: Self-pay | Admitting: Internal Medicine

## 2015-10-14 VITALS — BP 148/82 | HR 67 | Temp 98.1°F | Resp 20 | Ht 73.25 in | Wt 194.5 lb

## 2015-10-14 DIAGNOSIS — E785 Hyperlipidemia, unspecified: Secondary | ICD-10-CM

## 2015-10-14 DIAGNOSIS — Z86010 Personal history of colonic polyps: Secondary | ICD-10-CM

## 2015-10-14 DIAGNOSIS — Z0000 Encounter for general adult medical examination without abnormal findings: Secondary | ICD-10-CM | POA: Diagnosis not present

## 2015-10-14 DIAGNOSIS — I2510 Atherosclerotic heart disease of native coronary artery without angina pectoris: Secondary | ICD-10-CM | POA: Diagnosis not present

## 2015-10-14 MED ORDER — SILDENAFIL CITRATE 100 MG PO TABS: 50.0000 mg | ORAL_TABLET | Freq: Every day | ORAL | 11 refills | Status: DC | PRN

## 2015-10-14 NOTE — Progress Notes (Signed)
Subjective:    Patient ID: Collin [name], male    DOB: 01/30/1940, 75 y.o.   MRN: [id number]  HPI  75  -year-old patient who is seen today for a preventive health examination.  He is followed by cardiology with stable coronary artery disease. He remains stable. He continues to do quite well.  He does have a history of urinary frequency, but has a very liberal fluid intake.  He has a history of mildly elevated PSA intermittently.  No exertional symptoms.  He has been followed by  Orthopedics in the past due to left foot pain.  This has interfered with his golf activities  Past Medical History:  Diagnosis Date  . Allergy   . BPH (benign prostatic hyperplasia)   . CAD (coronary artery disease)   . Cataract   . Diverticulosis of colon   . Hx of adenomatous colonic polyps 05/22/2014  . Hyperlipidemia   . Hypertension   . MYOCARDIAL INFARCTION, HX OF 07/20/2006   Qualifier: Diagnosis of  By: Leanne Chang MD, Bruce      Social History   Social History  . Marital status: Married    Spouse name: N/A  . Number of children: N/A  . Years of education: N/A   Occupational History  . Not on file.   Social History Main Topics  . Smoking status: Never Smoker  . Smokeless tobacco: Never Used  . Alcohol use 0.6 oz/week    1 Glasses of wine per week  . Drug use: No  . Sexual activity: Not on file   Other Topics Concern  . Not on file   Social History Narrative  . No narrative on file    Past Surgical History:  Procedure Laterality Date  . cataract surg     Bil  . COLONOSCOPY    . PTCA      Family History  Problem Relation Age of Onset  . Heart disease Mother     CABG  . Aneurysm Father     AAA  . Stroke Father   . Kidney disease Father     renal failure  . Aneurysm Paternal Uncle     AAA  . Hypertension Sister   . Diabetes Brother   . Colon cancer Neg Hx     Allergies  Allergen Reactions  . Penicillins     REACTION: rash    Current Outpatient Prescriptions on  File Prior to Visit  Medication Sig Dispense Refill  . aspirin 81 MG tablet Take 81 mg by mouth daily.      . Cholecalciferol (VITAMIN D) 2000 UNITS tablet Take 4,000 Units by mouth daily.     Marland Kitchen co-enzyme Q-10 30 MG capsule Take 30 mg by mouth daily.      . fish oil-omega-3 fatty acids 1000 MG capsule Take 2 g by mouth daily.      . Glucosamine-Chondroitin (MOVE FREE PO) Take 1 tablet by mouth daily.      . metoprolol succinate (TOPROL-XL) 25 MG 24 hr tablet take 1 tablet by mouth once daily 90 tablet 2  . Multiple Vitamin (MULTIVITAMIN) tablet Take 1 tablet by mouth daily.      . nitroGLYCERIN (NITROSTAT) 0.3 MG SL tablet PLACE 1 TABLET UNDER THE TONGUE EVERY 5 MINUTES IF NEEDED 15 tablet 1  . Resveratrol 250 MG CAPS Take 1 capsule by mouth daily.      . Saw Palmetto, Serenoa repens, 450 MG CAPS Take 1 capsule by mouth 2 (two) times  daily.      . simvastatin (ZOCOR) 40 MG tablet take 1 tablet by mouth once daily AT 6 PM 90 tablet 2   No current facility-administered medications on file prior to visit.     There were no vitals taken for this visit.   1. Risk factors, based on past  M,S,F history-patient has known coronary artery disease.  Cardiovascular risk factors include hypertension, and dyslipidemia  2.  Physical activities: No activity retractions remains quite active with activities such as golf  3.  Depression/mood: No history of depression or mood disorder  4.  Hearing: Minor deficits only  5.  ADL's: Independent  6.  Fall risk: Low  7.  Home safety: No problems identified  8.  Height weight, and visual acuity; height weight, stable.  No change in visual acuity.  Is followed by ophthalmology annually.  Glaucoma suspect  9.  Counseling: Heart healthy diet, regular exercise encouraged  10. Lab orders based on risk factors: Laboratory profile reviewed  11. Referral : Followup cardiology  12. Care plan: Continue heart healthy diet and regular exercise  13. Cognitive  assessment: Alert and oriented with normal affect  14. Screening: Patient has had a recent AAA screen (2015), as well as laboratory evaluation.  We'll continue annual health examinations with screening lab.  Patient has had a recent colonoscopy 2016 which did reveal colonic polyps.  15. Provider List Update: Includes cardiology.  Dermatology and GI.     Review of Systems  Constitutional: Negative for appetite change, chills, fatigue and fever.  HENT: Negative for congestion, dental problem, ear pain, hearing loss, sore throat, tinnitus, trouble swallowing and voice change.   Eyes: Negative for pain, discharge and visual disturbance.  Respiratory: Negative for cough, chest tightness, wheezing and stridor.   Cardiovascular: Negative for chest pain, palpitations and leg swelling.  Gastrointestinal: Negative for abdominal distention, abdominal pain, blood in stool, constipation, diarrhea, nausea and vomiting.  Genitourinary: Negative for difficulty urinating, discharge, flank pain, genital sores, hematuria and urgency.  Musculoskeletal: Negative for arthralgias, back pain, gait problem, joint swelling, myalgias and neck stiffness.  Skin: Negative for rash.  Neurological: Negative for dizziness, syncope, speech difficulty, weakness, numbness and headaches.  Hematological: Negative for adenopathy. Does not bruise/bleed easily.  Psychiatric/Behavioral: Negative for behavioral problems and dysphoric mood. The patient is not nervous/anxious.        Objective:   Physical Exam  Constitutional: He appears well-developed and well-nourished.  HENT:  Head: Normocephalic and atraumatic.  Right Ear: External ear normal.  Left Ear: External ear normal.  Nose: Nose normal.  Mouth/Throat: Oropharynx is clear and moist.  Eyes: Conjunctivae and EOM are normal. Pupils are equal, round, and reactive to light. No scleral icterus.  Neck: Normal range of motion. Neck supple. No JVD present. No thyromegaly  present.  Cardiovascular: Regular rhythm and normal heart sounds.  Exam reveals no gallop and no friction rub.   No murmur heard. Decreased right dorsalis pedis pulse  Pulmonary/Chest: Effort normal and breath sounds normal. He exhibits no tenderness.  Abdominal: Soft. Bowel sounds are normal. He exhibits no distension and no mass. There is no tenderness.  Genitourinary: Prostate normal and penis normal. Rectal exam shows guaiac negative stool.  Genitourinary Comments: Prostate mildly enlarged  Musculoskeletal: Normal range of motion. He exhibits no edema or tenderness.  High arches  Lymphadenopathy:    He has no cervical adenopathy.  Neurological: He is alert. He has normal reflexes. No cranial nerve deficit. Coordination normal.  Decreased vibratory sensation lower extremities Mild left-sided weakness in ankle dorsiflexion  Skin: Skin is warm and dry. No rash noted.  Psychiatric: He has a normal mood and affect. His behavior is normal.          Assessment & Plan:   Preventive health examination Coronary artery disease, stable.  Followup cardiology Dyslipidemia.  Continue statin therapy Hypertension.  Continue metoprolol Mild BPH.  History of elevated PSA.  Follow-up PSA discussed.  Patient wishes to defer  Followup cardiology Recheck here in one year or as needed Medications updated  Nyoka Cowden

## 2015-10-14 NOTE — Progress Notes (Signed)
Pre visit review using our clinic review tool, if applicable. No additional management support is needed unless otherwise documented below in the visit note. 

## 2015-10-14 NOTE — Patient Instructions (Signed)
Limit your sodium (Salt) intake    It is important that you exercise regularly, at least 20 minutes 3 to 4 times per week.  If you develop chest pain or shortness of breath seek  medical attention.  Return in one year for follow-up   

## 2015-11-18 ENCOUNTER — Other Ambulatory Visit: Payer: Self-pay | Admitting: Cardiovascular Disease

## 2015-11-26 DIAGNOSIS — B351 Tinea unguium: Secondary | ICD-10-CM | POA: Diagnosis not present

## 2015-11-26 DIAGNOSIS — Z872 Personal history of diseases of the skin and subcutaneous tissue: Secondary | ICD-10-CM | POA: Diagnosis not present

## 2015-11-26 DIAGNOSIS — Q828 Other specified congenital malformations of skin: Secondary | ICD-10-CM | POA: Diagnosis not present

## 2015-11-26 DIAGNOSIS — Z23 Encounter for immunization: Secondary | ICD-10-CM | POA: Diagnosis not present

## 2015-11-26 DIAGNOSIS — Z85828 Personal history of other malignant neoplasm of skin: Secondary | ICD-10-CM | POA: Diagnosis not present

## 2015-11-26 DIAGNOSIS — D225 Melanocytic nevi of trunk: Secondary | ICD-10-CM | POA: Diagnosis not present

## 2015-11-26 DIAGNOSIS — L821 Other seborrheic keratosis: Secondary | ICD-10-CM | POA: Diagnosis not present

## 2016-01-28 ENCOUNTER — Ambulatory Visit (INDEPENDENT_AMBULATORY_CARE_PROVIDER_SITE_OTHER): Payer: PPO

## 2016-01-28 ENCOUNTER — Encounter: Payer: Self-pay | Admitting: Podiatry

## 2016-01-28 ENCOUNTER — Ambulatory Visit (INDEPENDENT_AMBULATORY_CARE_PROVIDER_SITE_OTHER): Payer: PPO | Admitting: Podiatry

## 2016-01-28 DIAGNOSIS — M79672 Pain in left foot: Secondary | ICD-10-CM

## 2016-01-28 DIAGNOSIS — L600 Ingrowing nail: Secondary | ICD-10-CM

## 2016-01-28 DIAGNOSIS — M21372 Foot drop, left foot: Secondary | ICD-10-CM

## 2016-01-28 DIAGNOSIS — M7662 Achilles tendinitis, left leg: Secondary | ICD-10-CM | POA: Diagnosis not present

## 2016-01-28 NOTE — Patient Instructions (Signed)

## 2016-01-28 NOTE — Progress Notes (Signed)
Subjective:     Patient ID: Collin [name], male   DOB: 1940/07/30, 76 y.o.   MRN: [id number]  HPI patient states that he has ingrown toenail of his left big toe medial border that's been painful when pressed and also he does have foot drop on his left side and is having discomfort associated with this   Review of Systems     Objective:   Physical Exam Neurovascular status is found to be intact muscle strength is adequate with patient found to have discomfort in the posterior Achilles tendon area which is chronic in nature and spelled the response so far to conservative care foot drop with no function of the anterior tibial tendon and incurvated left hallux medial border that's painful when pressed    Assessment:     Foot drop left with incurvation of the nail border and chronic Achilles tendinitis    Plan:     H&P all conditions reviewed and today I'm going to fix the ingrown toenail and explained procedure and risk. I infiltrated 60 mg I can Marcaine mixture remove the medial border exposed matrix and applied phenol 3 applications 30 seconds followed by alcohol lavage and sterile dressing. Gave instructions on soaks and discussed brace of an AFO nature for the left in order to prevent foot drop and try to also help his Achilles with lifting technique. Patient will be scheduled for casting for AFO type brace

## 2016-02-02 ENCOUNTER — Other Ambulatory Visit: Payer: PPO

## 2016-02-18 ENCOUNTER — Other Ambulatory Visit: Payer: Self-pay | Admitting: Cardiovascular Disease

## 2016-02-18 ENCOUNTER — Telehealth: Payer: Self-pay | Admitting: Cardiovascular Disease

## 2016-02-18 NOTE — Telephone Encounter (Signed)
Rx refill sent to pharmacy. 

## 2016-02-18 NOTE — Telephone Encounter (Signed)
I spoke with the pt and made him aware that he is due to schedule a 2 year follow-up office visit. Refills did send a 90 day supply with no refills into the pharmacy today.  I have scheduled the pt to see Dr Burt Knack 02/23/16.

## 2016-02-18 NOTE — Telephone Encounter (Signed)
Collin [name] is calling about his prescription.  Stating that his prescription( Simvastatin and Metoprolol)  would be filled without him having an appointment. He called the pharmacy and they said they cant fill it without an appointment . Please call

## 2016-02-20 ENCOUNTER — Encounter: Payer: Self-pay | Admitting: *Deleted

## 2016-02-23 ENCOUNTER — Ambulatory Visit (INDEPENDENT_AMBULATORY_CARE_PROVIDER_SITE_OTHER): Payer: PPO | Admitting: Cardiovascular Disease

## 2016-02-23 ENCOUNTER — Encounter: Payer: Self-pay | Admitting: Cardiovascular Disease

## 2016-02-23 VITALS — BP 150/80 | HR 60 | Ht 73.25 in | Wt 201.0 lb

## 2016-02-23 DIAGNOSIS — I2510 Atherosclerotic heart disease of native coronary artery without angina pectoris: Secondary | ICD-10-CM | POA: Diagnosis not present

## 2016-02-23 MED ORDER — NITROGLYCERIN 0.4 MG SL SUBL: 0.4000 mg | SUBLINGUAL_TABLET | SUBLINGUAL | 3 refills | Status: DC | PRN

## 2016-02-23 MED ORDER — SIMVASTATIN 40 MG PO TABS: ORAL_TABLET | ORAL | 3 refills | Status: DC

## 2016-02-23 MED ORDER — METOPROLOL SUCCINATE ER 25 MG PO TB24
25.0000 mg | ORAL_TABLET | Freq: Every day | ORAL | 3 refills | Status: DC
Start: 2016-02-23 — End: 2017-05-10

## 2016-02-23 NOTE — Progress Notes (Signed)
Cardiology Office Note Date:  02/23/2016   ID:  Collin [name], DOB [birth year], MRN [id number]  PCP:  Nyoka Cowden, MD  Cardiologist:  Sherren Mocha, MD    Chief Complaint  Patient presents with  . Follow-up    2 year follow up     History of Present Illness: Collin [name] is a 76 y.o. male who presents for follow-up of CAD.   He presented remotely with an inferior wall MI in 1998 treated with primary angioplasty of the right coronary artery. At that time he was noted to have moderate LAD stenosis and medical therapy was recommended. He developed exertional angina in 2013. A nuclear stress test showed mild anteroapical ischemia with normal LVEF of 56%. Cath at the time showed severe LAD stenosis and medical therapy was recommended. He was started on a beta blocker with complete resolution of symptoms.   The patient is here alone today. He's doing very well for cardiac perspective. He denies chest pain, chest pressure, or shortness of breath. He is not quite as active as he was last year because of problems with his left foot. He has developed Achilles tendinitis and bone spurs. He was walking a lot for exercise but can no longer do this. He still is able to use an elliptical machine 4 days per week without symptoms of chest pain or pressure.   Past Medical History:  Diagnosis Date  . Allergy   . BPH (benign prostatic hyperplasia)   . CAD (coronary artery disease)   . Cataract   . Diverticulosis of colon   . Hx of adenomatous colonic polyps 05/22/2014  . Hyperlipidemia   . Hypertension   . MYOCARDIAL INFARCTION, HX OF 07/20/2006   Qualifier: Diagnosis of  By: Leanne Chang MD, Bruce      Past Surgical History:  Procedure Laterality Date  . cataract surg     Bil  . COLONOSCOPY    . PTCA      Current Outpatient Prescriptions  Medication Sig Dispense Refill  . aspirin 81 MG tablet Take 81 mg by mouth daily.      . Cholecalciferol (VITAMIN D) 2000 UNITS tablet Take  4,000 Units by mouth daily.     Marland Kitchen co-enzyme Q-10 30 MG capsule Take 30 mg by mouth daily.      . fish oil-omega-3 fatty acids 1000 MG capsule Take 2 g by mouth daily.      . Glucosamine-Chondroitin (MOVE FREE PO) Take 1 tablet by mouth daily.      . metoprolol succinate (TOPROL-XL) 25 MG 24 hr tablet Take 1 tablet (25 mg total) by mouth daily. 90 tablet 3  . Multiple Vitamin (MULTIVITAMIN) tablet Take 1 tablet by mouth daily.      Marland Kitchen Resveratrol 250 MG CAPS Take 1 capsule by mouth daily.      . Saw Palmetto, Serenoa repens, 450 MG CAPS Take 1 capsule by mouth 2 (two) times daily.      . sildenafil (VIAGRA) 100 MG tablet Take 0.5-1 tablets (50-100 mg total) by mouth daily as needed for erectile dysfunction. 5 tablet 11  . simvastatin (ZOCOR) 40 MG tablet take 1 tablet by mouth once daily AT 6 PM 90 tablet 3  . nitroGLYCERIN (NITROSTAT) 0.4 MG SL tablet Place 1 tablet (0.4 mg total) under the tongue every 5 (five) minutes as needed for chest pain. 25 tablet 3   No current facility-administered medications for this visit.     Allergies:   Penicillins  Social History:  The patient  reports that he has never smoked. He has never used smokeless tobacco. He reports that he drinks about 0.6 oz of alcohol per week . He reports that he does not use drugs.   Family History:  The patient's  family history includes AAA (abdominal aortic aneurysm) in his father and paternal uncle; Diabetes in his brother; Heart disease in his mother; Hypertension in his sister; Kidney disease in his father; Stroke in his father.    ROS:  Please see the history of present illness.  Otherwise, review of systems is positive for memory problems, balance problems.  All other systems are reviewed and negative.    PHYSICAL EXAM: VS:  BP (!) 150/80   Pulse 60   Ht 6' 1.25" (1.861 m)   Wt 201 lb (91.2 kg)   BMI 26.34 kg/m  , BMI Body mass index is 26.34 kg/m. GEN: Well nourished, well developed, in no acute distress    HEENT: normal  Neck: no JVD, no masses. No carotid bruits Cardiac: RRR without murmur or gallop                Respiratory:  clear to auscultation bilaterally, normal work of breathing GI: soft, nontender, nondistended, + BS MS: no deformity or atrophy  Ext: no pretibial edema, pedal pulses 2+= bilaterally Skin: warm and dry, no rash Neuro:  Strength and sensation are intact Psych: euthymic mood, full affect  EKG:  EKG is ordered today. The ekg ordered today shows NSR, within normal limits  Recent Labs: 10/08/2015: ALT 24; BUN 16; Creatinine, Ser 1.24; Hemoglobin 15.6; Platelets 212.0; Potassium 4.2; Sodium 141; TSH 2.85   Lipid Panel     Component Value Date/Time   CHOL 147 10/08/2015 0819   TRIG 82.0 10/08/2015 0819   HDL 55.10 10/08/2015 0819   CHOLHDL 3 10/08/2015 0819   VLDL 16.4 10/08/2015 0819   LDLCALC 75 10/08/2015 0819      Wt Readings from Last 3 Encounters:  02/23/16 201 lb (91.2 kg)  10/14/15 194 lb 8 oz (88.2 kg)  10/08/14 192 lb (87.1 kg)     Cardiac Studies Reviewed: Cardiac Cath 10-20-2011: Procedural Findings: Hemodynamics: AO 122/59 with a mean of 85 LV 131/17  Coronary angiography: Coronary dominance: right  Left mainstem: Moderate calcification. No obstructive disease.  Left anterior descending (LAD): The LAD is heavily calcified throughout the proximal and mid segments. The proximal LAD has no obstructive disease. The first diagonal branch is small and it has an 80% ostial narrowing. The second diagonal branches of moderate caliber and it has an 80% stenosis in the proximal aspect. The mid LAD at the origin of the second diagonal branch shows a calcified 80% stenosis and there is a second lesion at the junction of the mid and distal LAD with 80% stenosis. The segment between the 2 stenoses is diffusely diseased. The distal LAD is patent with minor diffuse disease.  Left circumflex (LCx): The left circumflex is of moderate caliber. The vessel  is patent throughout its course down the AV groove. The first obtuse marginal branch divides into twin vessels. Both branch vessels are small and there is 50-60% ostial stenosis. The mid circumflex has 50% stenosis.  Right coronary artery (RCA): This is a large, dominant vessel. The vessel is heavily calcified throughout its course. There is mild nonobstructive plaque in the mid vessel with approximately 30% stenosis. The PDA and PLA branches are patent without significant stenosis.  Left ventriculography: Left ventricular  systolic function is normal, LVEF is estimated at 55-65%, there is no significant mitral regurgitation   Final Conclusions:   1. Severe single-vessel coronary artery disease involving the mid LAD 2. Nonobstructive disease of the right coronary artery and left circumflex 3. Preserved LV function  Recommendations: Considering the patient's minimal symptoms at present with CCS class I angina, recommend ongoing medical therapy. He would be a candidate for PCI of the mid LAD if his symptoms progress.   ASSESSMENT AND PLAN: 1.  CAD, native vessel: no angina: He will continue on aspirin, a statin drug, and metoprolol succinate. I will see him back in 2 years at his request.  2. Hyperlipidemia: lipids at/near goal with LDL 75 mg/dL. See labs above. Treated with simvastatin.   3. Elevated BP: reviewed past readings and majority in range. Continue same Rx.   Current medicines are reviewed with the patient today.  The patient does not have concerns regarding medicines.  Labs/ tests ordered today include:   Orders Placed This Encounter  Procedures  . EKG 12-Lead    Disposition:   FU 2 years  Signed, Sherren Mocha, MD  02/23/2016 3:07 PM    [facility] [place], [place], [place]  [address] Phone: [phone]; Fax: [phone]

## 2016-02-23 NOTE — Patient Instructions (Signed)
Medication Instructions:  Your physician recommends that you continue on your current medications as directed. Please refer to the Current Medication list given to you today.  Labwork: No new orders.   Testing/Procedures: No new orders.   Follow-Up: Your physician wants you to follow-up in: 2 YEARS with Dr Cooper.  You will receive a reminder letter in the mail two months in advance. If you don't receive a letter, please call our office to schedule the follow-up appointment.   Any Other Special Instructions Will Be Listed Below (If Applicable).     If you need a refill on your cardiac medications before your next appointment, please call your pharmacy.   

## 2016-03-23 DIAGNOSIS — H04123 Dry eye syndrome of bilateral lacrimal glands: Secondary | ICD-10-CM | POA: Diagnosis not present

## 2016-03-23 DIAGNOSIS — H40053 Ocular hypertension, bilateral: Secondary | ICD-10-CM | POA: Diagnosis not present

## 2016-04-29 DIAGNOSIS — H16042 Marginal corneal ulcer, left eye: Secondary | ICD-10-CM | POA: Diagnosis not present

## 2016-04-30 DIAGNOSIS — H16012 Central corneal ulcer, left eye: Secondary | ICD-10-CM | POA: Diagnosis not present

## 2016-05-03 DIAGNOSIS — H16012 Central corneal ulcer, left eye: Secondary | ICD-10-CM | POA: Diagnosis not present

## 2016-05-07 DIAGNOSIS — H16042 Marginal corneal ulcer, left eye: Secondary | ICD-10-CM | POA: Diagnosis not present

## 2016-05-18 DIAGNOSIS — H16042 Marginal corneal ulcer, left eye: Secondary | ICD-10-CM | POA: Diagnosis not present

## 2016-10-14 ENCOUNTER — Encounter: Payer: Self-pay | Admitting: Internal Medicine

## 2016-10-19 ENCOUNTER — Encounter: Payer: Self-pay | Admitting: Internal Medicine

## 2016-10-19 ENCOUNTER — Ambulatory Visit (INDEPENDENT_AMBULATORY_CARE_PROVIDER_SITE_OTHER): Payer: PPO | Admitting: Internal Medicine

## 2016-10-19 VITALS — BP 126/24 | HR 62 | Temp 98.3°F | Ht 73.25 in | Wt 193.0 lb

## 2016-10-19 DIAGNOSIS — K4090 Unilateral inguinal hernia, without obstruction or gangrene, not specified as recurrent: Secondary | ICD-10-CM

## 2016-10-19 DIAGNOSIS — I2510 Atherosclerotic heart disease of native coronary artery without angina pectoris: Secondary | ICD-10-CM

## 2016-10-19 DIAGNOSIS — Z86010 Personal history of colonic polyps: Secondary | ICD-10-CM

## 2016-10-19 DIAGNOSIS — E785 Hyperlipidemia, unspecified: Secondary | ICD-10-CM

## 2016-10-19 DIAGNOSIS — M21372 Foot drop, left foot: Secondary | ICD-10-CM

## 2016-10-19 DIAGNOSIS — Z0000 Encounter for general adult medical examination without abnormal findings: Secondary | ICD-10-CM

## 2016-10-19 DIAGNOSIS — Z860101 Personal history of adenomatous and serrated colon polyps: Secondary | ICD-10-CM

## 2016-10-19 LAB — COMPREHENSIVE METABOLIC PANEL
ALBUMIN: 4.6 g/dL (ref 3.5–5.2)
ALT: 25 U/L (ref 0–53)
AST: 32 U/L (ref 0–37)
Alkaline Phosphatase: 42 U/L (ref 39–117)
BILIRUBIN TOTAL: 0.7 mg/dL (ref 0.2–1.2)
BUN: 15 mg/dL (ref 6–23)
CALCIUM: 10 mg/dL (ref 8.4–10.5)
CHLORIDE: 101 meq/L (ref 96–112)
CO2: 31 mEq/L (ref 19–32)
CREATININE: 1.34 mg/dL (ref 0.40–1.50)
GFR: 54.75 mL/min — ABNORMAL LOW (ref >60.00)
Glucose, Bld: 100 mg/dL — ABNORMAL HIGH (ref 70–99)
Potassium: 5.1 mEq/L (ref 3.5–5.1)
Sodium: 137 mEq/L (ref 135–145)
Total Protein: 7.1 g/dL (ref 6.0–8.3)

## 2016-10-19 LAB — POCT URINALYSIS DIPSTICK
Bilirubin, UA: NEGATIVE
Clarity, UA: NEGATIVE
Glucose, UA: NEGATIVE
Ketones, UA: NEGATIVE
Leukocytes, UA: NEGATIVE
NITRITE UA: NEGATIVE
PH UA: 6 (ref 5.0–8.0)
Protein, UA: NEGATIVE
RBC UA: NEGATIVE
SPEC GRAV UA: 1.015 (ref 1.010–1.025)
UROBILINOGEN UA: 0.2 U/dL

## 2016-10-19 LAB — CBC WITH DIFFERENTIAL/PLATELET
BASOS ABS: 0 10*3/uL (ref 0.0–0.1)
BASOS PCT: 0.2 % (ref 0.0–3.0)
EOS ABS: 0.2 10*3/uL (ref 0.0–0.7)
Eosinophils Relative: 2.7 % (ref 0.0–5.0)
HCT: 46.1 % (ref 39.0–52.0)
Hemoglobin: 15.1 g/dL (ref 13.0–17.0)
LYMPHS PCT: 32.4 % (ref 12.0–46.0)
Lymphs Abs: 2.2 10*3/uL (ref 0.7–4.0)
MCHC: 32.7 g/dL (ref 30.0–36.0)
MCV: 88.8 fl (ref 78.0–100.0)
Monocytes Absolute: 0.6 10*3/uL (ref 0.1–1.0)
Monocytes Relative: 9.1 % (ref 3.0–12.0)
NEUTROS PCT: 55.6 % (ref 43.0–77.0)
Neutro Abs: 3.8 10*3/uL (ref 1.4–7.7)
PLATELETS: 228 10*3/uL (ref 150.0–400.0)
RBC: 5.19 Mil/uL (ref 4.22–5.81)
RDW: 13.4 % (ref 11.5–15.5)
WBC: 6.8 10*3/uL (ref 4.0–10.5)

## 2016-10-19 LAB — LIPID PANEL
CHOLESTEROL: 134 mg/dL (ref 0–200)
HDL: 58.2 mg/dL (ref >39.00)
LDL CALC: 52 mg/dL (ref 0–99)
NonHDL: 75.88
TRIGLYCERIDES: 118 mg/dL (ref 0.0–149.0)
Total CHOL/HDL Ratio: 2
VLDL: 23.6 mg/dL (ref 0.0–40.0)

## 2016-10-19 LAB — TSH: TSH: 1.26 u[IU]/mL (ref 0.35–4.50)

## 2016-10-19 NOTE — Progress Notes (Signed)
Subjective:    Patient ID: Otila Back Philbrook, male    DOB: October 29, 1940, 76 y.o.   MRN: [id number]  HPI  76 year old patient who is seen today for a preventive health examination. He has a history of coronary artery disease and LAD stenosis.  He has been asymptomatic since starting beta blocker therapy years ago.  Remains quite active with him activities as well as cough without any exertional chest pain or dyspnea.  Concerns today include some weakness involving the left foot.  He has been felt to have a mild left foot drop  He was seen by podiatry was told he had a tight tendon. He is concerned a bit about his memory.  He describes some occasional word finding difficulties. Remains on aspirin and statin therapy.  MMSE performed today with a score of 29/30.  He is able only to recall 2 of 3 objects  Past Medical History:  Diagnosis Date  . Allergy   . BPH (benign prostatic hyperplasia)   . CAD (coronary artery disease)   . Cataract   . Diverticulosis of colon   . Hx of adenomatous colonic polyps 05/22/2014  . Hyperlipidemia   . Hypertension   . MYOCARDIAL INFARCTION, HX OF 07/20/2006   Qualifier: Diagnosis of  By: Leanne Chang MD, Bruce       Social History   Social History  . Marital status: Married    Spouse name: N/A  . Number of children: N/A  . Years of education: N/A   Occupational History  . Not on file.   Social History Main Topics  . Smoking status: Never Smoker  . Smokeless tobacco: Never Used  . Alcohol use 0.6 oz/week    1 Glasses of wine per week  . Drug use: No  . Sexual activity: Not on file   Other Topics Concern  . Not on file   Social History Narrative  . No narrative on file    Past Surgical History:  Procedure Laterality Date  . cataract surg     Bil  . COLONOSCOPY    . PTCA      Family History  Problem Relation Age of Onset  . Heart disease Mother        CABG  . Stroke Father   . Kidney disease Father        renal failure  . AAA  (abdominal aortic aneurysm) Father   . Hypertension Sister   . Diabetes Brother   . AAA (abdominal aortic aneurysm) Paternal Uncle   . Colon cancer Neg Hx     Allergies  Allergen Reactions  . Penicillins     REACTION: rash    Current Outpatient Prescriptions on File Prior to Visit  Medication Sig Dispense Refill  . aspirin 81 MG tablet Take 81 mg by mouth daily.      . Cholecalciferol (VITAMIN D) 2000 UNITS tablet Take 4,000 Units by mouth daily.     Marland Kitchen co-enzyme Q-10 30 MG capsule Take 30 mg by mouth daily.      . fish oil-omega-3 fatty acids 1000 MG capsule Take 2 g by mouth daily.      . Glucosamine-Chondroitin (MOVE FREE PO) Take 1 tablet by mouth daily.      . metoprolol succinate (TOPROL-XL) 25 MG 24 hr tablet Take 1 tablet (25 mg total) by mouth daily. 90 tablet 3  . Multiple Vitamin (MULTIVITAMIN) tablet Take 1 tablet by mouth daily.      Marland Kitchen Resveratrol 250 MG CAPS  Take 1 capsule by mouth daily.      . Saw Palmetto, Serenoa repens, 450 MG CAPS Take 1 capsule by mouth 2 (two) times daily.      . sildenafil (VIAGRA) 100 MG tablet Take 0.5-1 tablets (50-100 mg total) by mouth daily as needed for erectile dysfunction. 5 tablet 11  . simvastatin (ZOCOR) 40 MG tablet take 1 tablet by mouth once daily AT 6 PM 90 tablet 3  . nitroGLYCERIN (NITROSTAT) 0.4 MG SL tablet Place 1 tablet (0.4 mg total) under the tongue every 5 (five) minutes as needed for chest pain. 25 tablet 3   No current facility-administered medications on file prior to visit.     BP (!) 126/24 (BP Location: Left Arm, Patient Position: Sitting, Cuff Size: Normal)   Pulse 62   Temp 98.3 F (36.8 C) (Oral)   Ht 6' 1.25" (1.861 m)   Wt 193 lb (87.5 kg)   SpO2 98%   BMI 25.29 kg/m   Subsequent Medicare wellness visit  1. Risk factors, based on past  M,S,F history.  Patient has known coronary artery disease.  Remains asymptomatic.  Cardiovascular risk factors include dyslipidemia.  2.  Physical activities:remains  quite active with golf and gym activity 3 times per week.  No exercise limitations.  No exertional chest pain  3.  Depression/mood:no history of depression or mood disorder  4.  Hearing:no deficits  5.  ADL's:independent  6.  Fall risk:low  7.  Home safety:no problems identified  8.  Height weight, and visual acuity;height and weight stable no change in visual acuity  9.  Counseling:continue heart healthy diet  10. Lab orders based on risk factors:laboratory update will be reviewed.  Will also review nerve conduction studies due to his left foot drop  11. Referral :nerve conduction studies.  Follow-up cardiology annually  12. Care plan: continue efforts at aggressive risk factor modification  13. Cognitive assessment: alert and oriented with normal affect no cognitive dysfunction  14. Screening: Patient provided with a written and personalized 5-10 year screening schedule in the AVS.    15. Provider List Update: cardiology, primary care    Review of Systems  Constitutional: Negative for appetite change, chills, fatigue and fever.  HENT: Negative for congestion, dental problem, ear pain, hearing loss, sore throat, tinnitus, trouble swallowing and voice change.   Eyes: Negative for pain, discharge and visual disturbance.  Respiratory: Negative for cough, chest tightness, wheezing and stridor.   Cardiovascular: Negative for chest pain, palpitations and leg swelling.  Gastrointestinal: Negative for abdominal distention, abdominal pain, blood in stool, constipation, diarrhea, nausea and vomiting.  Genitourinary: Negative for difficulty urinating, discharge, flank pain, genital sores, hematuria and urgency.  Musculoskeletal: Negative for arthralgias, back pain, gait problem, joint swelling, myalgias and neck stiffness.  Skin: Negative for rash.  Neurological: Negative for dizziness, syncope, speech difficulty, weakness, numbness and headaches.  Hematological: Negative for  adenopathy. Does not bruise/bleed easily.  Psychiatric/Behavioral: Negative for behavioral problems and dysphoric mood. The patient is not nervous/anxious.        Objective:   Physical Exam  Constitutional: He appears well-developed and well-nourished.  HENT:  Head: Normocephalic and atraumatic.  Right Ear: External ear normal.  Left Ear: External ear normal.  Nose: Nose normal.  Mouth/Throat: Oropharynx is clear and moist.  Eyes: Pupils are equal, round, and reactive to light. Conjunctivae and EOM are normal. No scleral icterus.  Neck: Normal range of motion. Neck supple. No JVD present. No thyromegaly present.  Cardiovascular: Regular rhythm, normal heart sounds and intact distal pulses.  Exam reveals no gallop and no friction rub.   No murmur heard. Right dorsalis pedis pulse diminished  Pulmonary/Chest: Effort normal and breath sounds normal. He exhibits no tenderness.  Abdominal: Soft. Bowel sounds are normal. He exhibits no distension and no mass. There is no tenderness.  Small left inguinal hernia  Genitourinary: Prostate normal and penis normal. Rectal exam shows guaiac negative stool.  Musculoskeletal: Normal range of motion. He exhibits no edema or tenderness.  High arches  Lymphadenopathy:    He has no cervical adenopathy.  Neurological: He is alert. He has normal reflexes. No cranial nerve deficit. Coordination normal.  Mild weakness in left great toe extension Decreased vibratory sensation distally  Skin: Skin is warm and dry. No rash noted.  Psychiatric: He has a normal mood and affect. His behavior is normal.          Assessment & Plan:   Preventive health examination Subsequent Medicare wellness visit  History of mild word finding difficulties.  MMSE 29/30 Coronary artery disease.  Remains asymptomatic.  Continue aspirin and statin therapy.  Follow-up cardiology  No change in medical regimen Check screening lab Follow-up one year  Nyoka Cowden

## 2016-10-19 NOTE — Patient Instructions (Addendum)
Limit your sodium (Salt) intake    It is important that you exercise regularly, at least 20 minutes 3 to 4 times per week.  If you develop chest pain or shortness of breath seek  medical attention.  Nerve conduction studies as discussed  Cardiology follow-up as scheduled

## 2016-10-20 LAB — HEPATITIS C ANTIBODY
Hepatitis C Ab: NONREACTIVE
SIGNAL TO CUT-OFF: 0.04 (ref <1.00)

## 2016-10-25 ENCOUNTER — Encounter: Payer: Self-pay | Admitting: Neurology

## 2016-11-09 ENCOUNTER — Encounter: Payer: PPO | Admitting: Neurology

## 2016-11-18 ENCOUNTER — Ambulatory Visit: Payer: PPO

## 2016-11-30 ENCOUNTER — Ambulatory Visit (INDEPENDENT_AMBULATORY_CARE_PROVIDER_SITE_OTHER): Payer: PPO | Admitting: Neurology

## 2016-11-30 DIAGNOSIS — M21372 Foot drop, left foot: Secondary | ICD-10-CM

## 2016-11-30 DIAGNOSIS — M5417 Radiculopathy, lumbosacral region: Secondary | ICD-10-CM

## 2016-11-30 DIAGNOSIS — G629 Polyneuropathy, unspecified: Secondary | ICD-10-CM

## 2016-11-30 NOTE — Procedures (Signed)
[facility]  [place], [address]  [place], [place] [address] Tel: [phone] Fax:  [phone] Test Date:  11/30/2016  Patient: Collin [name] DOB: Oct 31, 1940 Physician: Narda Amber, DO  Sex: Male Height: 6\' 1"  Ref Phys: Bluford Kaufmann, MD  ID#: [id number] Temp: 34.2C Technician:    Patient Complaints: This is a 76 year old man referred for evaluation of left foot drop.  NCV & EMG Findings: Extensive electrodiagnostic testing of the left lower extremity and additional studies of the right shows:  1. Bilateral superficial peroneal and sural sensory responses are absent. 2. Bilateral peroneal (EDB) and tibial motor responses are absent. Bilateral peroneal motor responses at the tibialis anterior are within normal limits, however the response on the left is asymmetrically reduced. There is no evidence of prolonged latency or conduction velocity slowing across the fibular head. 3. Right tibial H reflex study is within normal limits. 4. Severe chronic motor axon loss changes are seen affecting the L5 myotome on the left, with without evidence of active denervation. Chronic motor axon loss changes are seen to a lesser degree in the right lower extremity below the knee and right gluteus medius muscle.   Impression: There is electrophysiologic findings are most consistent with a chronic sensorimotor polyneuropathy, axon loss in type, affecting the bilateral lower extremities; severe in degree electrically.  There is also evidence of a superimposed L5 radiculopathy affecting bilateral lower extremities, which is significantly worse on the left and severe in degree electrically.   ___________________________ Narda Amber, DO    Nerve Conduction Studies Anti Sensory Summary Table   Site NR Peak (ms) Norm Peak (ms) P-T Amp (V) Norm P-T Amp  Left Sup Peroneal Anti Sensory (Ant Lat Mall)  12 cm NR  <4.6  >3  Right Sup Peroneal Anti Sensory (Ant Lat Mall)  12 cm NR   <4.6  >3  Left Sural Anti Sensory (Lat Mall)  Calf NR  <4.6  >3  Right Sural Anti Sensory (Lat Mall)  Calf NR  <4.6  >3   Motor Summary Table   Site NR Onset (ms) Norm Onset (ms) O-P Amp (mV) Norm O-P Amp Site1 Site2 Delta-0 (ms) Dist (cm) Vel (m/s) Norm Vel (m/s)  Left Peroneal Motor (Ext Dig Brev)  Ankle NR  <6.0  >2.5 B Fib Ankle  0.0  >40  B Fib NR     Poplt B Fib  0.0  >40  Poplt NR            Right Peroneal Motor (Ext Dig Brev)  Ankle NR  <6.0  >2.5 B Fib Ankle  0.0  >40  B Fib NR     Poplt B Fib  0.0  >40  Poplt NR            Left Peroneal TA Motor (Tib Ant)  Fib Head    3.5 <4.5 3.2 >3 Poplit Fib Head 1.2 9.0 75 >40  Poplit    4.7  3.2         Right Peroneal TA Motor (Tib Ant)  Fib Head    2.6 <4.5 5.4 >3 Poplit Fib Head 1.6 8.0 50 >40  Poplit    4.2  5.2         Left Tibial Motor (Abd Hall Brev)  Ankle NR  <6.0  >4 Knee Ankle  0.0  >40  Knee NR            Right Tibial Motor (Abd Hall Brev)  Ankle NR  <  6.0  >4 Knee Ankle  0.0  >40  Knee NR             H Reflex Studies   NR H-Lat (ms) Lat Norm (ms) L-R H-Lat (ms)  Right Tibial (Gastroc)     33.47 <35    EMG   Side Muscle Ins Act Fibs Psw Fasc Number Recrt Dur Dur. Amp Amp. Poly Poly. Comment  Left AntTibialis Nml Nml Nml Nml [facility] Rapid Many 1+ Many 1+ Nml Nml ATR  Left Gastroc Nml Nml Nml Nml 1- Rapid Few 1+ Few 1+ Nml Nml N/A  Left Flex Dig Long Nml Nml Nml Nml 3- Rapid Most 1+ Most 1+ Many 1+ N/A  Left BicepsFemS   Nml Nml Nml Nml Nml Nml Nml Nml Nml Nml Nml Nml N/A  Left RectFemoris Nml Nml Nml Nml Nml Nml Nml Nml Nml Nml Nml Nml N/A  Left GluteusMed Nml Nml Nml Nml 2- Rapid Some 1+ Some 1+ Nml Nml N/A  Left ExtHallLong Nml Nml Nml Nml 3- Rapid Most 1+ Most 1+ Nml Nml N/A  Right AntTibialis Nml Nml Nml Nml 1- Rapid Some 1+ Some 1+ Nml Nml N/A  Right Gastroc Nml Nml Nml Nml 1- Rapid Few 1+ Few 1+ Nml Nml N/A  Right GluteusMed Nml Nml Nml Nml 1- Rapid Few 1+ Few 1+ Nml Nml N/A      Waveforms:

## 2016-12-01 DIAGNOSIS — Z23 Encounter for immunization: Secondary | ICD-10-CM | POA: Diagnosis not present

## 2016-12-01 DIAGNOSIS — L570 Actinic keratosis: Secondary | ICD-10-CM | POA: Diagnosis not present

## 2016-12-01 DIAGNOSIS — D485 Neoplasm of uncertain behavior of skin: Secondary | ICD-10-CM | POA: Diagnosis not present

## 2016-12-01 DIAGNOSIS — L821 Other seborrheic keratosis: Secondary | ICD-10-CM | POA: Diagnosis not present

## 2016-12-01 DIAGNOSIS — D225 Melanocytic nevi of trunk: Secondary | ICD-10-CM | POA: Diagnosis not present

## 2016-12-01 DIAGNOSIS — L219 Seborrheic dermatitis, unspecified: Secondary | ICD-10-CM | POA: Diagnosis not present

## 2016-12-01 DIAGNOSIS — Z85828 Personal history of other malignant neoplasm of skin: Secondary | ICD-10-CM | POA: Diagnosis not present

## 2016-12-06 ENCOUNTER — Encounter: Payer: Self-pay | Admitting: Internal Medicine

## 2016-12-06 ENCOUNTER — Ambulatory Visit: Payer: PPO | Admitting: Internal Medicine

## 2016-12-06 VITALS — BP 132/70 | HR 72 | Temp 98.5°F | Ht 73.25 in | Wt 194.8 lb

## 2016-12-06 DIAGNOSIS — B9789 Other viral agents as the cause of diseases classified elsewhere: Secondary | ICD-10-CM

## 2016-12-06 DIAGNOSIS — B309 Viral conjunctivitis, unspecified: Secondary | ICD-10-CM | POA: Diagnosis not present

## 2016-12-06 DIAGNOSIS — G609 Hereditary and idiopathic neuropathy, unspecified: Secondary | ICD-10-CM | POA: Diagnosis not present

## 2016-12-06 DIAGNOSIS — G629 Polyneuropathy, unspecified: Secondary | ICD-10-CM | POA: Insufficient documentation

## 2016-12-06 DIAGNOSIS — J069 Acute upper respiratory infection, unspecified: Secondary | ICD-10-CM

## 2016-12-06 MED ORDER — SULFACETAMIDE SODIUM 10 % OP SOLN: 2.0000 [drp] | OPHTHALMIC | 0 refills | Status: DC

## 2016-12-06 NOTE — Progress Notes (Signed)
Subjective:    Patient ID: Collin [name], male    DOB: [birth year], 76 y.o.   MRN: [id number]  HPI  76 year old patient who presents with a 7-10-day history of head congestion sneezing cough and sore throat.  There is been no fever.  For the past couple days he has had some redness involving the right eye associated with some dry exudate in the morning.  This has improved over the past 24 hours. He did exercise 3 times last week.  Results of patient's recent nerve conduction studies also discussed.  The patient has had a mild left foot drop for probably 4-5 years.  He remains concerned about balance issues.  He states that he is often very unsteady when he stands from a sitting position.  He is very active with activities such as golf which have not deteriorated.  He mild left foot drop also appears to be nonprogressive. Nerve conduction studies consistent with a bilateral chronic severe sensorimotor polyneuropathy as well as evidence of a superimposed L5 radiculopathy on the left.  Past Medical History:  Diagnosis Date  . Allergy   . BPH (benign prostatic hyperplasia)   . CAD (coronary artery disease)   . Cataract   . Diverticulosis of colon   . Hx of adenomatous colonic polyps 05/22/2014  . Hyperlipidemia   . Hypertension   . MYOCARDIAL INFARCTION, HX OF 07/20/2006   Qualifier: Diagnosis of  By: Leanne Chang MD, Bruce       Social History   Socioeconomic History  . Marital status: Married    Spouse name: Not on file  . Number of children: Not on file  . Years of education: Not on file  . Highest education level: Not on file  Social Needs  . Financial resource strain: Not on file  . Food insecurity - worry: Not on file  . Food insecurity - inability: Not on file  . Transportation needs - medical: Not on file  . Transportation needs - non-medical: Not on file  Occupational History  . Not on file  Tobacco Use  . Smoking status: Never Smoker  . Smokeless tobacco: Never Used    Substance and Sexual Activity  . Alcohol use: Yes    Alcohol/week: 0.6 oz    Types: 1 Glasses of wine per week  . Drug use: No  . Sexual activity: Not on file  Other Topics Concern  . Not on file  Social History Narrative  . Not on file    Past Surgical History:  Procedure Laterality Date  . cataract surg     Bil  . COLONOSCOPY    . PTCA      Family History  Problem Relation Age of Onset  . Heart disease Mother        CABG  . Stroke Father   . Kidney disease Father        renal failure  . AAA (abdominal aortic aneurysm) Father   . Hypertension Sister   . Diabetes Brother   . AAA (abdominal aortic aneurysm) Paternal Uncle   . Colon cancer Neg Hx     Allergies  Allergen Reactions  . Penicillins     REACTION: rash    Current Outpatient Medications on File Prior to Visit  Medication Sig Dispense Refill  . aspirin 81 MG tablet Take 81 mg by mouth daily.      . Cholecalciferol (VITAMIN D) 2000 UNITS tablet Take 4,000 Units by mouth daily.     Marland Kitchen co-enzyme  Q-10 30 MG capsule Take 30 mg by mouth daily.      . fish oil-omega-3 fatty acids 1000 MG capsule Take 2 g by mouth daily.      . Glucosamine-Chondroitin (MOVE FREE PO) Take 1 tablet by mouth daily.      . metoprolol succinate (TOPROL-XL) 25 MG 24 hr tablet Take 1 tablet (25 mg total) by mouth daily. 90 tablet 3  . Multiple Vitamin (MULTIVITAMIN) tablet Take 1 tablet by mouth daily.      Marland Kitchen Resveratrol 250 MG CAPS Take 1 capsule by mouth daily.      . Saw Palmetto, Serenoa repens, 450 MG CAPS Take 1 capsule by mouth 2 (two) times daily.      . sildenafil (VIAGRA) 100 MG tablet Take 0.5-1 tablets (50-100 mg total) by mouth daily as needed for erectile dysfunction. 5 tablet 11  . simvastatin (ZOCOR) 40 MG tablet take 1 tablet by mouth once daily AT 6 PM 90 tablet 3  . nitroGLYCERIN (NITROSTAT) 0.4 MG SL tablet Place 1 tablet (0.4 mg total) under the tongue every 5 (five) minutes as needed for chest pain. 25 tablet 3    No current facility-administered medications on file prior to visit.     BP 132/70 (BP Location: Left Arm, Patient Position: Sitting, Cuff Size: Normal)   Pulse 72   Temp 98.5 F (36.9 C) (Oral)   Ht 6' 1.25" (1.861 m)   Wt 194 lb 12.8 oz (88.4 kg)   SpO2 96%   BMI 25.53 kg/m      Review of Systems  Constitutional: Positive for activity change, appetite change and fatigue. Negative for chills and fever.  HENT: Positive for congestion, postnasal drip and sore throat. Negative for dental problem, ear pain, hearing loss, tinnitus, trouble swallowing and voice change.   Eyes: Positive for discharge and redness. Negative for pain and visual disturbance.  Respiratory: Positive for cough. Negative for chest tightness, wheezing and stridor.   Cardiovascular: Negative for chest pain, palpitations and leg swelling.  Gastrointestinal: Negative for abdominal distention, abdominal pain, blood in stool, constipation, diarrhea, nausea and vomiting.  Genitourinary: Negative for difficulty urinating, discharge, flank pain, genital sores, hematuria and urgency.  Musculoskeletal: Negative for arthralgias, back pain, gait problem, joint swelling, myalgias and neck stiffness.  Skin: Negative for rash.  Neurological: Negative for dizziness, syncope, speech difficulty, weakness, numbness and headaches.  Hematological: Negative for adenopathy. Does not bruise/bleed easily.  Psychiatric/Behavioral: Negative for behavioral problems and dysphoric mood. The patient is not nervous/anxious.        Objective:   Physical Exam  Constitutional: He is oriented to person, place, and time. He appears well-developed. No distress.  HENT:  Head: Normocephalic.  Right Ear: External ear normal.  Left Ear: External ear normal.  Erythema of the oropharynx  Eyes: Conjunctivae and EOM are normal.  Bilateral conjunctival injection right greater than left.  No exudate noted  Neck: Normal range of motion.   Cardiovascular: Normal rate, regular rhythm and normal heart sounds.  Pulmonary/Chest: Breath sounds normal. No respiratory distress. He exhibits no tenderness.  Abdominal: Bowel sounds are normal.  Musculoskeletal: Normal range of motion. He exhibits no edema or tenderness.  Lymphadenopathy:    He has no cervical adenopathy.  Neurological: He is alert and oriented to person, place, and time.  Diminished left foot dorsiflexion Decreased vibratory sensation distally  Normal finger to nose testing Heel to shin testing slightly clumsy left greater than right  Psychiatric: He has a normal  mood and affect. His behavior is normal.          Assessment & Plan:   Viral URI with cough.  Will treat symptomatically Viral conjunctivitis.  This has improved over the past 24 hours.  Bacterial conjunctivitis unlikely;  will treat with artificial tears and observe.  Patient given a prescription for Bleph-10 and if any clinical worsening    Peripheral neuropathy.   Left foot drop.  Probably secondary to left L5 radiculopathy  Options discussed.  Neurology consultation-order placed  [facility]

## 2016-12-06 NOTE — Patient Instructions (Addendum)
Acute bronchitis symptoms for less than 10 days are generally not helped by antibiotics.  Take over-the-counter expectorants and cough medications such as  Mucinex DM.  Call if there is no improvement in 5 to 7 days or if  you develop worsening cough, fever, or new symptoms, such as shortness of breath or chest pain.  Hydrate and Humidify  Drink enough water to keep your urine clear or pale yellow. Staying hydrated will help to thin your mucus.  Use a cool mist humidifier to keep the humidity level in your home above 50%.  Inhale steam for 10-15 minutes, 3-4 times a day or as told by your health care provider. You can do this in the bathroom while a hot shower is running.  Limit your exposure to cool or dry air. Rest  Rest as much as possible.  Viral Conjunctivitis, Adult Viral conjunctivitis is an inflammation of the clear membrane that covers the white part of your eye and the inner surface of your eyelid (conjunctiva). The inflammation is caused by a viral infection. The blood vessels in the conjunctiva become inflamed, causing the eye to become red or pink, and often itchy. Viral conjunctivitis can be easily passed from one person to another (is contagious). This condition is often called pink eye. What are the causes? This condition is caused by a virus. A virus is a type of contagious germ. It can be spread by touching objects that have been contaminated with the virus, such as doorknobs or towels. It can also be passed through droplets, such as from coughing or sneezing. What are the signs or symptoms? Symptoms of this condition include:  Eye redness.  Tearing or watery eyes.  Itchy and irritated eyes.  Burning feeling in the eyes.  Clear drainage from the eye.  Swollen eyelids.  A gritty feeling in the eye.  Light sensitivity.  This condition often occurs with other symptoms, such as a fever, nausea, or a rash. How is this diagnosed? This condition is diagnosed with a  medical history and physical exam. If you have discharge from your eye, the discharge may be tested to rule out other causes of conjunctivitis. How is this treated? Viral conjunctivitis does not respond to medicines that kill bacteria (antibiotics). Treatment for viral conjunctivitis is directed at stopping a bacterial infection from developing in addition to the viral infection. Treatment also aims to relieve your symptoms, such as itching. This may be done with antihistamine drops or other eye medicines. Rarely, steroid eye drops or antiviral medicines may be prescribed. Follow these instructions at home: Medicines   Take or apply over-the-counter and prescription medicines only as told by your health care provider.  Be very careful to avoid touching the edge of the eyelid with the eye drop bottle or ointment tube when applying medicines to the affected eye. Being careful this way will stop you from spreading the infection to the other eye or to other people. Eye care  Avoid touching or rubbing your eyes.  Apply a warm, wet, clean washcloth to your eye for 10-20 minutes, 3-4 times per day or as told by your health care provider.  If you wear contact lenses, do not wear them until the inflammation is gone and your health care provider says it is safe to wear them again. Ask your health care provider how to sterilize or replace your contact lenses before using them again. Wear glasses until you can resume wearing contacts.  Avoid wearing eye makeup until the inflammation  is gone. Throw away any old eye cosmetics that may be contaminated.  Gently wipe away any drainage from your eye with a warm, wet washcloth or a cotton ball. General instructions  Change or wash your pillowcase every day or as told by your health care provider.  Do not share towels, pillowcases, washcloths, eye makeup, makeup brushes, contact lenses, or glasses. This may spread the infection.  Wash your hands often with  soap and water. Use paper towels to dry your hands. If soap and water are not available, use hand sanitizer.  Try to avoid contact with other people for one week or as told by your health care provider. Contact a health care provider if:  Your symptoms do not improve with treatment or they get worse.  You have increased pain.  Your vision becomes blurry.  You have a fever.  You have facial pain, redness, or swelling.  You have yellow or green drainage coming from your eye.  You have new symptoms. This information is not intended to replace advice given to you by your health care provider. Make sure you discuss any questions you have with your health care provider. Document Released: 04/03/2002 Document Revised: 08/09/2015 Document Reviewed: 07/29/2015 Elsevier Interactive Patient Education  2017 [employer].

## 2016-12-07 ENCOUNTER — Encounter: Payer: Self-pay | Admitting: Neurology

## 2016-12-13 ENCOUNTER — Ambulatory Visit: Payer: Self-pay | Admitting: *Deleted

## 2016-12-13 NOTE — Telephone Encounter (Signed)
Was seen last Monday for cough. It has worsened since and coughing up thick, green phlegm.  Reason for Disposition . [1] Continuous (nonstop) coughing interferes with work or school AND [2] no improvement using cough treatment per Care Advice  Answer Assessment - Initial Assessment Questions 1. ONSET: "When did the cough begin?"      10 days ago 2. SEVERITY: "How bad is the cough today?"      bad 3. RESPIRATORY DISTRESS: "Describe your breathing."     normal 4. FEVER: "Do you have a fever?" If so, ask: "What is your temperature, how was it measured, and when did it start?"     no 5. SPUTUM: "Describe the color of your sputum" (clear, white, yellow, green)    Green, thick 6. HEMOPTYSIS: "Are you coughing up any blood?" If so ask: "How much?" (flecks, streaks, tablespoons, etc.)     no 7. CARDIAC HISTORY: "Do you have any history of heart disease?" (e.g., heart attack, congestive heart failure)      yes 8. LUNG HISTORY: "Do you have any history of lung disease?"  (e.g., pulmonary embolus, asthma, emphysema)    no 9. PE RISK FACTORS: "Do you have a history of blood clots?" (or: recent major surgery, recent prolonged travel, bedridden )    no 10. OTHER SYMPTOMS: "Do you have any other symptoms?" (e.g., runny nose, wheezing, chest pain)       no 11. PREGNANCY: "Is there any chance you are pregnant?" "When was your last menstrual period?"       na 12. TRAVEL: "Have you traveled out of the country in the last month?" (e.g., travel history, exposures)      na  Protocols used: [place]

## 2016-12-14 ENCOUNTER — Encounter: Payer: Self-pay | Admitting: Family Medicine

## 2016-12-14 ENCOUNTER — Ambulatory Visit: Payer: PPO | Admitting: Family Medicine

## 2016-12-14 VITALS — BP 120/70 | HR 68 | Temp 99.0°F | Wt 194.2 lb

## 2016-12-14 DIAGNOSIS — J069 Acute upper respiratory infection, unspecified: Secondary | ICD-10-CM

## 2016-12-14 DIAGNOSIS — B9789 Other viral agents as the cause of diseases classified elsewhere: Secondary | ICD-10-CM | POA: Diagnosis not present

## 2016-12-14 MED ORDER — FLUTICASONE PROPIONATE 50 MCG/ACT NA SUSP: 1.0000 | Freq: Every day | NASAL | 0 refills | Status: DC

## 2016-12-14 NOTE — Patient Instructions (Addendum)
Cough, Adult Coughing is a reflex that clears your throat and your airways. Coughing helps to heal and protect your lungs. It is normal to cough occasionally, but a cough that happens with other symptoms or lasts a long time may be a sign of a condition that needs treatment. A cough may last only 2-3 weeks (acute), or it may last longer than 8 weeks (chronic). What are the causes? Coughing is commonly caused by:  Breathing in substances that irritate your lungs.  A viral or bacterial respiratory infection.  Allergies.  Asthma.  Postnasal drip.  Smoking.  Acid backing up from the stomach into the esophagus (gastroesophageal reflux).  Certain medicines.  Chronic lung problems, including COPD (or rarely, lung cancer).  Other medical conditions such as heart failure.  Follow these instructions at home: Pay attention to any changes in your symptoms. Take these actions to help with your discomfort:  Take medicines only as told by your health care provider. ? If you were prescribed an antibiotic medicine, take it as told by your health care provider. Do not stop taking the antibiotic even if you start to feel better. ? Talk with your health care provider before you take a cough suppressant medicine.  Drink enough fluid to keep your urine clear or pale yellow.  If the air is dry, use a cold steam vaporizer or humidifier in your bedroom or your home to help loosen secretions.  Avoid anything that causes you to cough at work or at home.  If your cough is worse at night, try sleeping in a semi-upright position.  Avoid cigarette smoke. If you smoke, quit smoking. If you need help quitting, ask your health care provider.  Avoid caffeine.  Avoid alcohol.  Rest as needed.  Contact a health care provider if:  You have new symptoms.  You cough up pus.  Your cough does not get better after 2-3 weeks, or your cough gets worse.  You cannot control your cough with suppressant  medicines and you are losing sleep.  You develop pain that is getting worse or pain that is not controlled with pain medicines.  You have a fever.  You have unexplained weight loss.  You have night sweats. Get help right away if:  You cough up blood.  You have difficulty breathing.  Your heartbeat is very fast. This information is not intended to replace advice given to you by your health care provider. Make sure you discuss any questions you have with your health care provider. Document Released: 07/10/2010 Document Revised: 06/19/2015 Document Reviewed: 03/20/2014 Elsevier Interactive Patient Education  2017 [place].  Upper Respiratory Infection, Adult Most upper respiratory infections (URIs) are a viral infection of the air passages leading to the lungs. A URI affects the nose, throat, and upper air passages. The most common type of URI is nasopharyngitis and is typically referred to as "the common cold." URIs run their course and usually go away on their own. Most of the time, a URI does not require medical attention, but sometimes a bacterial infection in the upper airways can follow a viral infection. This is called a secondary infection. Sinus and middle ear infections are common types of secondary upper respiratory infections. Bacterial pneumonia can also complicate a URI. A URI can worsen asthma and chronic obstructive pulmonary disease (COPD). Sometimes, these complications can require emergency medical care and may be life threatening. What are the causes? Almost all URIs are caused by viruses. A virus is a type of  germ and can spread from one person to another. What increases the risk? You may be at risk for a URI if:  You smoke.  You have chronic heart or lung disease.  You have a weakened defense (immune) system.  You are very young or very old.  You have nasal allergies or asthma.  You work in crowded or poorly ventilated areas.  You work in health care  facilities or schools.  What are the signs or symptoms? Symptoms typically develop 2-3 days after you come in contact with a cold virus. Most viral URIs last 7-10 days. However, viral URIs from the influenza virus (flu virus) can last 14-18 days and are typically more severe. Symptoms may include:  Runny or stuffy (congested) nose.  Sneezing.  Cough.  Sore throat.  Headache.  Fatigue.  Fever.  Loss of appetite.  Pain in your forehead, behind your eyes, and over your cheekbones (sinus pain).  Muscle aches.  How is this diagnosed? Your health care provider may diagnose a URI by:  Physical exam.  Tests to check that your symptoms are not due to another condition such as: ? Strep throat. ? Sinusitis. ? Pneumonia. ? Asthma.  How is this treated? A URI goes away on its own with time. It cannot be cured with medicines, but medicines may be prescribed or recommended to relieve symptoms. Medicines may help:  Reduce your fever.  Reduce your cough.  Relieve nasal congestion.  Follow these instructions at home:  Take medicines only as directed by your health care provider.  Gargle warm saltwater or take cough drops to comfort your throat as directed by your health care provider.  Use a warm mist humidifier or inhale steam from a shower to increase air moisture. This may make it easier to breathe.  Drink enough fluid to keep your urine clear or pale yellow.  Eat soups and other clear broths and maintain good nutrition.  Rest as needed.  Return to work when your temperature has returned to normal or as your health care provider advises. You may need to stay home longer to avoid infecting others. You can also use a face mask and careful hand washing to prevent spread of the virus.  Increase the usage of your inhaler if you have asthma.  Do not use any tobacco products, including cigarettes, chewing tobacco, or electronic cigarettes. If you need help quitting, ask your  health care provider. How is this prevented? The best way to protect yourself from getting a cold is to practice good hygiene.  Avoid oral or hand contact with people with cold symptoms.  Wash your hands often if contact occurs.  There is no clear evidence that vitamin C, vitamin E, echinacea, or exercise reduces the chance of developing a cold. However, it is always recommended to get plenty of rest, exercise, and practice good nutrition. Contact a health care provider if:  You are getting worse rather than better.  Your symptoms are not controlled by medicine.  You have chills.  You have worsening shortness of breath.  You have brown or red mucus.  You have yellow or brown nasal discharge.  You have pain in your face, especially when you bend forward.  You have a fever.  You have swollen neck glands.  You have pain while swallowing.  You have white areas in the back of your throat. Get help right away if:  You have severe or persistent: ? Headache. ? Ear pain. ? Sinus pain. ? Chest  pain.  You have chronic lung disease and any of the following: ? Wheezing. ? Prolonged cough. ? Coughing up blood. ? A change in your usual mucus.  You have a stiff neck.  You have changes in your: ? Vision. ? Hearing. ? Thinking. ? Mood. This information is not intended to replace advice given to you by your health care provider. Make sure you discuss any questions you have with your health care provider. Document Released: 07/07/2000 Document Revised: 09/14/2015 Document Reviewed: 04/18/2013 Elsevier Interactive Patient Education  2017 [employer].

## 2016-12-14 NOTE — Progress Notes (Signed)
Subjective:    Patient ID: Collin [name], male    DOB: 1940-02-13, 76 y.o.   MRN: [id number]  No chief complaint on file.   HPI Patient was seen today for ongoing issue.  Patient with cough and sore throat times 2 weeks.  Seen last week by PCP, for viral URI and viral conjunctivitis.  Has tried taking over-the-counter cold medicine, Delsym, had a hot Toddy (bourbon, lemon, water, honey), gargling with salt water, taking 2 Tylenol every night.  Patient denies fever.  Cough does not keep pt up at night.  Pt blowing nose more, but denies rhinorrhea.  Past Medical History:  Diagnosis Date  . Allergy   . BPH (benign prostatic hyperplasia)   . CAD (coronary artery disease)   . Cataract   . Diverticulosis of colon   . Hx of adenomatous colonic polyps 05/22/2014  . Hyperlipidemia   . Hypertension   . MYOCARDIAL INFARCTION, HX OF 07/20/2006   Qualifier: Diagnosis of  By: Leanne Chang MD, Bruce      Allergies  Allergen Reactions  . Penicillins     REACTION: rash    ROS General: Denies fever, chills, night sweats, changes in weight, changes in appetite HEENT: Denies headaches, ear pain, changes in vision, rhinorrhea  +sore throat CV: Denies CP, palpitations, SOB, orthopnea Pulm: Denies SOB, wheezing  +cough GI: Denies abdominal pain, nausea, vomiting, diarrhea, constipation GU: Denies dysuria, hematuria, frequency, vaginal discharge Msk: Denies muscle cramps, joint pains Neuro: Denies weakness, numbness, tingling Skin: Denies rashes, bruising Psych: Denies depression, anxiety, hallucinations     Objective:    Blood pressure 120/70, pulse 68, temperature 99 F (37.2 C), temperature source Oral, weight 194 lb 3.2 oz (88.1 kg), SpO2 98 %.   Gen. Pleasant, well-nourished, in no distress, normal affect   HEENT: [place]/AT, face symmetric, conjunctiva clear, no scleral icterus, PERRLA, nares patent with erythema and clear drainage, pharynx with erythema, post nasal drainage, no exudate. Lungs:  no accessory muscle use, CTAB, no wheezes or rales Cardiovascular: RRR, no m/r/g, no peripheral edema Abdomen: BS present, soft, NT/ND Neuro:  A&Ox3, CN II-XII intact, normal gait  Wt Readings from Last 3 Encounters:  12/14/16 194 lb 3.2 oz (88.1 kg)  12/06/16 194 lb 12.8 oz (88.4 kg)  10/19/16 193 lb (87.5 kg)    Lab Results  Component Value Date   WBC 6.8 10/19/2016   HGB 15.1 10/19/2016   HCT 46.1 10/19/2016   PLT 228.0 10/19/2016   GLUCOSE 100 (H) 10/19/2016   CHOL 134 10/19/2016   TRIG 118.0 10/19/2016   HDL 58.20 10/19/2016   LDLCALC 52 10/19/2016   ALT 25 10/19/2016   AST 32 10/19/2016   NA 137 10/19/2016   K 5.1 10/19/2016   CL 101 10/19/2016   CREATININE 1.34 10/19/2016   BUN 15 10/19/2016   CO2 31 10/19/2016   TSH 1.26 10/19/2016   PSA 5.36 (H) 10/03/2014   INR 1.0 10/13/2011    Assessment/Plan:  Viral URI with cough -discussed possible causes of continued symptoms including viral infection with superimposed bacterial infection. -continue supportive care -given handout -reviewed proper nasal spray use  - Plan: DG Chest 2 View, fluticasone (FLONASE) 50 MCG/ACT nasal spray -Pt hesitant about getting CXR--States may have it done tomorrow, asks if it is really needed. -Given RTC precautions.  F/u prn.

## 2016-12-22 DIAGNOSIS — H52203 Unspecified astigmatism, bilateral: Secondary | ICD-10-CM | POA: Diagnosis not present

## 2016-12-22 DIAGNOSIS — Z961 Presence of intraocular lens: Secondary | ICD-10-CM | POA: Diagnosis not present

## 2016-12-27 ENCOUNTER — Ambulatory Visit: Payer: PPO | Admitting: Internal Medicine

## 2017-01-26 ENCOUNTER — Other Ambulatory Visit: Payer: PPO

## 2017-01-26 ENCOUNTER — Ambulatory Visit (INDEPENDENT_AMBULATORY_CARE_PROVIDER_SITE_OTHER): Payer: PPO | Admitting: Neurology

## 2017-01-26 ENCOUNTER — Encounter: Payer: Self-pay | Admitting: Neurology

## 2017-01-26 VITALS — BP 124/80 | HR 72 | Ht 73.25 in | Wt 192.0 lb

## 2017-01-26 DIAGNOSIS — G3184 Mild cognitive impairment, so stated: Secondary | ICD-10-CM | POA: Diagnosis not present

## 2017-01-26 DIAGNOSIS — R278 Other lack of coordination: Secondary | ICD-10-CM | POA: Diagnosis not present

## 2017-01-26 DIAGNOSIS — M5417 Radiculopathy, lumbosacral region: Secondary | ICD-10-CM | POA: Diagnosis not present

## 2017-01-26 DIAGNOSIS — G629 Polyneuropathy, unspecified: Secondary | ICD-10-CM | POA: Diagnosis not present

## 2017-01-26 DIAGNOSIS — M21372 Foot drop, left foot: Secondary | ICD-10-CM | POA: Diagnosis not present

## 2017-01-26 NOTE — Progress Notes (Signed)
[facility] Clinic Note - Initial Visit   Date: 01/26/17  Collin [name] MRN: [id number] DOB: 04/30/1940   Dear Dr. Burnice Logan:  Thank you for your kind referral of Collin [name] for consultation of left foot drop and neuropathy. Although his history is well known to you, please allow [place] to reiterate it for the purpose of our medical record. The patient was accompanied to the clinic by self.    History of Present Illness: Collin [name] is a 77 y.o. right-handed Caucasian male with hypertension, hyperlipidemia, CAD s/p angioplasty, and BPH presenting for evaluation of left drop foot and neuropathy.    Starting around 2015, he began having left drop foot and saw podiatry who was treating him for Achilles tendonitis.  He continued to have weakness of legs, worse on the left.  He denies any numbness and only occasionally has tingling of the feet. He was referred for electrodiagnostic testing of the legs which showed severe sensorimotor neuropathy and bilateral L5 radiculopathy, worse on the left.  He has imbalance and suffered one fall.  He walks unassisted.  He denies any low back pain, but does exercises for low back strengthening. There is no family history of neuropathy.  No personal history of alcohol use or diabetes.   He also complains of word-finding problems, especially with nouns. Later, the word may come to him, but it is frustrating when he cannot think of the word he is trying to say. He manages is own finances and medications without problems.  No issues with driving or getting lost.    Out-side paper records, electronic medical record, and images have been reviewed where available and summarized as:  NCS/EMG of the legs 11/30/2016: There is electrophysiologic findings are most consistent with a chronic sensorimotor polyneuropathy, axon loss in type, affecting the bilateral lower extremities; severe in degree electrically. There is also evidence  of a superimposed L5 radiculopathy affecting bilateral lower extremities, which is significantly worse on the left and severe in degree electrically.  Lab Results  Component Value Date   TSH 1.26 10/19/2016    Past Medical History:  Diagnosis Date  . Allergy   . BPH (benign prostatic hyperplasia)   . CAD (coronary artery disease)   . Cataract   . Diverticulosis of colon   . Hx of adenomatous colonic polyps 05/22/2014  . Hyperlipidemia   . Hypertension   . MYOCARDIAL INFARCTION, HX OF 07/20/2006   Qualifier: Diagnosis of  By: Leanne Chang MD, Bruce      Past Surgical History:  Procedure Laterality Date  . cataract surg     Bil  . COLONOSCOPY    . PTCA       Medications:  Outpatient Encounter Medications as of 01/26/2017  Medication Sig  . aspirin 81 MG tablet Take 81 mg by mouth daily.    . Cholecalciferol (VITAMIN D) 2000 UNITS tablet Take 4,000 Units by mouth daily.   Marland Kitchen co-enzyme Q-10 30 MG capsule Take 30 mg by mouth daily.    . fish oil-omega-3 fatty acids 1000 MG capsule Take 2 g by mouth daily.    . fluticasone (FLONASE) 50 MCG/ACT nasal spray Place 1 spray into both nostrils daily.  . Glucosamine-Chondroitin (MOVE FREE PO) Take 1 tablet by mouth daily.    . metoprolol succinate (TOPROL-XL) 25 MG 24 hr tablet Take 1 tablet (25 mg total) by mouth daily.  . Multiple Vitamin (MULTIVITAMIN) tablet Take 1 tablet by mouth daily.    Marland Kitchen  Resveratrol 250 MG CAPS Take 1 capsule by mouth daily.    . Saw Palmetto, Serenoa repens, 450 MG CAPS Take 1 capsule by mouth 2 (two) times daily.    . sildenafil (VIAGRA) 100 MG tablet Take 0.5-1 tablets (50-100 mg total) by mouth daily as needed for erectile dysfunction.  . simvastatin (ZOCOR) 40 MG tablet take 1 tablet by mouth once daily AT 6 PM  . sulfacetamide (BLEPH-10) 10 % ophthalmic solution Place 2 drops every 4 (four) hours into the right eye.  . nitroGLYCERIN (NITROSTAT) 0.4 MG SL tablet Place 1 tablet (0.4 mg total) under the tongue every  5 (five) minutes as needed for chest pain.   No facility-administered encounter medications on file as of 01/26/2017.      Allergies:  Allergies  Allergen Reactions  . Penicillins     REACTION: rash    Family History: Family History  Problem Relation Age of Onset  . Heart disease Mother        CABG  . Stroke Father   . Kidney disease Father        renal failure  . AAA (abdominal aortic aneurysm) Father   . Hypertension Sister   . Diabetes Brother   . AAA (abdominal aortic aneurysm) Paternal Uncle   . Colon cancer Neg Hx     Social History: Social History   Tobacco Use  . Smoking status: Never Smoker  . Smokeless tobacco: Never Used  Substance Use Topics  . Alcohol use: Yes    Alcohol/week: 0.6 oz    Types: 1 Glasses of wine per week    Comment: Occasional beer.  . Drug use: No   Social History   Social History Narrative   Lives with wife in a 2 story home.  Has 3 children.     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    Education: college.     Review of Systems:  CONSTITUTIONAL: No fevers, chills, night sweats, or weight loss.   EYES: No visual changes or eye pain ENT: No hearing changes.  No history of nose bleeds.   RESPIRATORY: No cough, wheezing and shortness of breath.   CARDIOVASCULAR: Negative for chest pain, and palpitations.   GI: Negative for abdominal discomfort, blood in stools or black stools.  No recent change in bowel habits.   GU:  No history of incontinence.   MUSCLOSKELETAL: No history of joint pain or swelling.  No myalgias.   SKIN: Negative for lesions, rash, and itching.   HEMATOLOGY/ONCOLOGY: Negative for prolonged bleeding, bruising easily, and swollen nodes.  No history of cancer.   ENDOCRINE: Negative for cold or heat intolerance, polydipsia or goiter.   PSYCH:  No depression or anxiety symptoms.   NEURO: As Above.   Vital Signs:  BP 124/80   Pulse 72   Ht 6' 1.25" (1.861 m)   Wt 192 lb (87.1 kg)   SpO2  97%   BMI 25.16 kg/m    General Medical Exam:   General:  Well appearing, comfortable.   Eyes/ENT: see cranial nerve examination.   Neck: No masses appreciated.  Full range of motion without tenderness.  No carotid bruits. Respiratory:  Clear to auscultation, good air entry bilaterally.   Cardiac:  Regular rate and rhythm, no murmur.   Extremities:  Pes cavus bilaterally, bilateral hammer toes.  No edema Skin:  No rashes or lesions.  Neurological Exam: MENTAL STATUS including orientation to time, place, person, recent and remote memory, attention span  and concentration, language, and fund of knowledge is normal.  Speech is not dysarthric. Montreal Cognitive Assessment  01/26/2017  Visuospatial/ Executive (0/5) 4  Naming (0/3) 2  Attention: Read list of digits (0/2) 2  Attention: Read list of letters (0/1) 1  Attention: Serial 7 subtraction starting at 100 (0/3) 3  Language: Repeat phrase (0/2) 2  Language : Fluency (0/1) 1  Abstraction (0/2) 2  Delayed Recall (0/5) 3  Orientation (0/6) 5  Total 25  Adjusted Score (based on education) 25    CRANIAL NERVES: II:  No visual field defects.  Unremarkable fundi.   III-IV-VI: Pupils equal round and reactive to light.  Normal conjugate, extra-ocular eye movements in all directions of gaze.  No nystagmus.  No ptosis.   V:  Normal facial sensation.     VII:  Normal facial symmetry and movements. VIII:  Normal hearing and vestibular function.   IX-X:  Normal palatal movement.   XI:  Normal shoulder shrug and head rotation.   XII:  Normal tongue strength and range of motion, no deviation or fasciculation.  MOTOR:  Generalized loss of muscle bulk in the lower legs and atrophy of the intrinsic feet muscles. No pronator drift.  Tone is normal.    Right Upper Extremity:    Left Upper Extremity:    Deltoid  5/5   Deltoid  5/5   Biceps  5/5   Biceps  5/5   Triceps  5/5   Triceps  5/5   Wrist extensors  5/5   Wrist extensors  5/5   Wrist  flexors  5/5   Wrist flexors  5/5   Finger extensors  5/5   Finger extensors  5/5   Finger flexors  5/5   Finger flexors  5/5   Dorsal interossei  5/5   Dorsal interossei  5/5   Abductor pollicis  5/5   Abductor pollicis  5/5   Tone (Ashworth scale)  0  Tone (Ashworth scale)  0   Right Lower Extremity:    Left Lower Extremity:    Hip flexors  5/5   Hip flexors  5/5   Hip extensors  5/5   Hip extensors  5/5   Knee flexors  5/5   Knee flexors  5/5   Knee extensors  5/5   Knee extensors  5/5   Dorsiflexors  5/5   Dorsiflexors  4+/5   Plantarflexors  5/5   Plantarflexors  5/5   Eversion 5/5  Eversion 5-/5  Inversion 5/5  Inversion 4/5  Toe extensors  5-/5   Toe extensors  4/5   Toe flexors  5-/5   Toe flexors  5-/5   Tone (Ashworth scale)  0  Tone (Ashworth scale)  0   MSRs:  Right                                                                 Left brachioradialis 2+  brachioradialis 2+  biceps 2+  biceps 2+  triceps 2+  triceps 2+  patellar 0  patellar 2+  ankle jerk 0  ankle jerk 0  Hoffman no  Hoffman no  plantar response down  plantar response down   SENSORY:  Sensation is intact in the upper extremities.  Vibration is  absent distal to ankles; temperature and light touch is reduced distal to lower legs in a gradient pattern. Pin prick intact.  There is marked sway with Rhomberg testing.   COORDINATION/GAIT: Normal finger-to- nose-finger.  Intact rapid alternating movements bilaterally.  Able to rise from a chair without using arms.  Gait narrow based, mild dragging of the left foot.  He is able to stand on heels and toes. There is some unsteadiness with tandem gait.   IMPRESSION: 1.  Distal and symmetric sensorimotor polyneuropathy affecting the feet.  He has no risk factors for neuropathy, making idiopathic neuropathy most likely. Check vitamin B12, MMA, copper, folate, SPEP with IFE.  Start physical therapy for gait training.  He was encouraged to start using insole support  for his high arches. Fall precautions discussed.   2.  Possible L5 radiculopathy (left > right) seen on NCS/EMG.  Recommend MRI lumbar spine without contrast to evaluation for structural pathology for his left foot drop.   3.  Mild cognitive impairment, predominately with word-finding. He scored 25/30 on his MOCA (multiple domains). Reassured patient that there is no signs of dementia.  Recommend engaging in mentally stimulating activities, such as puzzles, crosswords, reading, music, as well as staying active.  Consider neurocognitive testing, if symptoms get worse.   Return to clinic in 4 months.   Thank you for allowing me to participate in patient's care.  If I can answer any additional questions, I would be pleased to do so.    Sincerely,    Donika K. Posey Pronto, DO

## 2017-01-26 NOTE — Patient Instructions (Addendum)
MRI lumbar spine  Check labs for causes of neuropathy  Start physical therapy for balance and gait training  Recommend engaging in mentally stimulating activities, such as puzzles, crosswords, reading, music, as well as staying active  Return to clinic 4 months

## 2017-01-28 DIAGNOSIS — R262 Difficulty in walking, not elsewhere classified: Secondary | ICD-10-CM | POA: Diagnosis not present

## 2017-01-28 DIAGNOSIS — R2681 Unsteadiness on feet: Secondary | ICD-10-CM | POA: Diagnosis not present

## 2017-01-28 LAB — PROTEIN ELECTROPHORESIS, SERUM
ALBUMIN ELP: 4.1 g/dL (ref 3.8–4.8)
ALPHA 1: 0.3 g/dL (ref 0.2–0.3)
ALPHA 2: 0.7 g/dL (ref 0.5–0.9)
BETA GLOBULIN: 0.4 g/dL (ref 0.4–0.6)
Beta 2: 0.3 g/dL (ref 0.2–0.5)
Gamma Globulin: 1.1 g/dL (ref 0.8–1.7)
Total Protein: 6.9 g/dL (ref 6.1–8.1)

## 2017-01-28 LAB — IMMUNOFIXATION ELECTROPHORESIS
IMMUNOFIX ELECTR INT: NOT DETECTED
IgG (Immunoglobin G), Serum: 1090 mg/dL (ref 694–1618)
IgM, Serum: 74 mg/dL (ref 48–271)
Immunoglobulin A: 190 mg/dL (ref 81–463)

## 2017-01-29 LAB — VITAMIN B12: VITAMIN B 12: 601 pg/mL (ref 200–1100)

## 2017-01-29 LAB — COPPER, SERUM: Copper: 94 ug/dL (ref 70–175)

## 2017-01-29 LAB — METHYLMALONIC ACID, SERUM: Methylmalonic Acid, Quant: 158 nmol/L (ref 87–318)

## 2017-01-29 LAB — FOLATE

## 2017-01-31 ENCOUNTER — Encounter: Payer: Self-pay | Admitting: *Deleted

## 2017-01-31 ENCOUNTER — Telehealth: Payer: Self-pay | Admitting: *Deleted

## 2017-01-31 NOTE — Telephone Encounter (Signed)
-----   Message from Alda Berthold, DO sent at 01/31/2017 10:00 AM EST ----- Please notify patient lab are within normal limits.  Thank you.

## 2017-01-31 NOTE — Telephone Encounter (Signed)
Results sent via My Chart.  

## 2017-02-03 ENCOUNTER — Telehealth: Payer: Self-pay | Admitting: Neurology

## 2017-02-03 NOTE — Telephone Encounter (Signed)
Patient Lmom regarding Not hearing from anyone to schedule his MRI. Please call. Thanks

## 2017-02-03 NOTE — Telephone Encounter (Signed)
Called [facility] and they had called patient earlier today and left message for him to call them back.  Attempted to contact patient to have him call them but the phone was busy.

## 2017-02-04 NOTE — Telephone Encounter (Signed)
Left message informing patient that Man left him a message about scheduling the MRI.  I left him their number so that he can call them to schedule the appointment.

## 2017-02-10 ENCOUNTER — Ambulatory Visit
Admission: RE | Admit: 2017-02-10 | Discharge: 2017-02-10 | Disposition: A | Discharge status: Home / Self Care | Payer: PPO | Source: Ambulatory Visit | Attending: Neurology | Admitting: Neurology

## 2017-02-10 DIAGNOSIS — G3184 Mild cognitive impairment, so stated: Secondary | ICD-10-CM

## 2017-02-10 DIAGNOSIS — M21372 Foot drop, left foot: Secondary | ICD-10-CM

## 2017-02-10 DIAGNOSIS — M5417 Radiculopathy, lumbosacral region: Secondary | ICD-10-CM

## 2017-02-10 DIAGNOSIS — R278 Other lack of coordination: Secondary | ICD-10-CM

## 2017-02-10 DIAGNOSIS — G629 Polyneuropathy, unspecified: Secondary | ICD-10-CM

## 2017-02-10 DIAGNOSIS — M48061 Spinal stenosis, lumbar region without neurogenic claudication: Secondary | ICD-10-CM | POA: Diagnosis not present

## 2017-02-11 ENCOUNTER — Telehealth: Payer: Self-pay | Admitting: *Deleted

## 2017-02-11 NOTE — Telephone Encounter (Signed)
-----   Message from Pieter Partridge, DO sent at 02/11/2017  7:29 AM EST ----- MRI does show a bulging disc that seems to be pressing the nerve that causes foot drop.  I recommend referral to neurosurgery ASAP for further evaluation.

## 2017-02-11 NOTE — Telephone Encounter (Signed)
Patient given results and instructions.  He would rather talk to Dr. Posey Pronto before seeing neurosurgery.  Informed him that she is out of the office until January 29 but that I would let her know.

## 2017-02-22 ENCOUNTER — Telehealth: Payer: Self-pay | Admitting: Neurology

## 2017-02-22 NOTE — Telephone Encounter (Signed)
Pt left a voicemail message saying he has not heard from the surgical center yet

## 2017-02-23 DIAGNOSIS — M21372 Foot drop, left foot: Secondary | ICD-10-CM | POA: Diagnosis not present

## 2017-02-23 NOTE — Telephone Encounter (Signed)
Dr. Posey Pronto spoke with patient's wife.  Referral sent.

## 2017-03-16 ENCOUNTER — Ambulatory Visit: Payer: PPO | Admitting: Neurology

## 2017-04-25 ENCOUNTER — Encounter: Payer: Self-pay | Admitting: Internal Medicine

## 2017-05-10 ENCOUNTER — Other Ambulatory Visit: Payer: Self-pay | Admitting: Cardiovascular Disease

## 2017-06-03 ENCOUNTER — Telehealth: Payer: Self-pay | Admitting: Neurology

## 2017-06-03 NOTE — Telephone Encounter (Signed)
Patient is wanting to know should he keep next weeks appointment. It's for a 4 month Follow Up. He a few months ago was referred to a Psychologist, sport and exercise. He did not think he was to come back? Please Advise. Thanks

## 2017-06-03 NOTE — Telephone Encounter (Signed)
Please advise 

## 2017-06-06 NOTE — Telephone Encounter (Signed)
Please confirm that he saw neurosurgery for his foot drop.  If so and his neuropathy and memory is doing well and he has no other neurological concerns, ok to cancel his appointment and follow-up as needed.

## 2017-06-07 NOTE — Telephone Encounter (Signed)
I spoke with patient and he has already seen neurosurgery and they do not plan on doing surgery.  Informed him that we can cancel his upcoming appointment with Dr. Posey Pronto and he can follow up as needed.

## 2017-06-08 ENCOUNTER — Ambulatory Visit: Payer: PPO | Admitting: Neurology

## 2017-06-28 ENCOUNTER — Encounter: Payer: Self-pay | Admitting: Internal Medicine

## 2017-06-28 ENCOUNTER — Ambulatory Visit (INDEPENDENT_AMBULATORY_CARE_PROVIDER_SITE_OTHER): Payer: PPO | Admitting: Internal Medicine

## 2017-06-28 VITALS — BP 130/68 | HR 74 | Ht 73.5 in | Wt 194.5 lb

## 2017-06-28 DIAGNOSIS — Z86010 Personal history of colonic polyps: Secondary | ICD-10-CM | POA: Diagnosis not present

## 2017-06-28 NOTE — Progress Notes (Signed)
   Marcia Lepera Lyvers 77 y.o. Jul 30, 1940 [id number]  Assessment & Plan:   Encounter Diagnosis  Name Primary?  Marland Kitchen Hx of adenomatous colonic polyps Yes    The patient is decided not to pursue further: Polyp surveillance.  This seems reasonable, he understands I cannot guarantee that colon cancer would not develop at some point in his lifetime and affect him but he accepts that risk.  He will be seen on an as-needed basis.  I have explained for him to watch out for changes in bowel habits or bleeding etc. that might signal colorectal neoplasia.  I appreciate the opportunity to care for this patient. CC: Marletta Lor, MD  Subjective:   Chief Complaint:  HPI Hx 4 diminutive adenomas 2016 No GI sxs now Not anemic as of late 2018 We discussed repeating colonoscopy today Allergies  Allergen Reactions  . Penicillins     REACTION: rash   Current Meds  Medication Sig  . aspirin 81 MG tablet Take 81 mg by mouth daily.    . Cholecalciferol (VITAMIN D) 2000 UNITS tablet Take 4,000 Units by mouth daily.   Marland Kitchen co-enzyme Q-10 30 MG capsule Take 30 mg by mouth daily.    . fish oil-omega-3 fatty acids 1000 MG capsule Take 2 g by mouth daily.    . fluticasone (FLONASE) 50 MCG/ACT nasal spray Place 1 spray into both nostrils daily.  . Glucosamine-Chondroitin (MOVE FREE PO) Take 1 tablet by mouth daily.    . metoprolol succinate (TOPROL-XL) 25 MG 24 hr tablet TAKE 1 TABLET BY MOUTH ONCE DAILY  . Multiple Vitamin (MULTIVITAMIN) tablet Take 1 tablet by mouth daily.    Marland Kitchen Resveratrol 250 MG CAPS Take 1 capsule by mouth daily.    . Saw Palmetto, Serenoa repens, 450 MG CAPS Take 1 capsule by mouth 2 (two) times daily.    . sildenafil (VIAGRA) 100 MG tablet Take 0.5-1 tablets (50-100 mg total) by mouth daily as needed for erectile dysfunction.  . simvastatin (ZOCOR) 40 MG tablet TAKE 1 TABLET BY MOUTH ONCE DAILY AT 6PM  . sulfacetamide (BLEPH-10) 10 % ophthalmic solution Place 2 drops every 4  (four) hours into the right eye.   Past Medical History:  Diagnosis Date  . Allergy   . BPH (benign prostatic hyperplasia)   . CAD (coronary artery disease)   . Cataract   . Diverticulosis of colon   . Hx of adenomatous colonic polyps 05/22/2014  . Hyperlipidemia   . Hypertension   . MYOCARDIAL INFARCTION, HX OF 07/20/2006   Qualifier: Diagnosis of  By: Leanne Chang MD, Bruce     Past Surgical History:  Procedure Laterality Date  . CATARACT EXTRACTION     Bil  . COLONOSCOPY    . PTCA     Review of Systems footdrop  Objective:   Physical Exam BP 130/68   Pulse 74   Ht 6' 1.5" (1.867 m)   Wt 194 lb 8 oz (88.2 kg)   BMI 25.31 kg/m  NAD  15 minutes time spent with patient > half in counseling coordination of care

## 2017-06-28 NOTE — Patient Instructions (Signed)
  Please follow up with Dr Gessner as needed.   I appreciate the opportunity to care for you. Carl Gessner, MD, FACG 

## 2017-07-01 ENCOUNTER — Telehealth: Payer: Self-pay | Admitting: Neurology

## 2017-07-06 ENCOUNTER — Telehealth: Payer: Self-pay | Admitting: Cardiovascular Disease

## 2017-07-06 NOTE — Telephone Encounter (Signed)
Patient called to see if he should switch to Vascepa. Reviewed last year's lab work. His LDL was well below goal. He will continue current medications at this time. Scheduled patient for overdue visit in December per request. He was grateful for call and agrees with treatment plan.

## 2017-07-06 NOTE — Telephone Encounter (Signed)
New Message:    Pt wants to know if Mills Koller is better than some of the medicine he is taking? He does not know a lot about this medicine, he heard it was good.

## 2017-07-18 NOTE — Progress Notes (Signed)
[facility] [place], [place] [address] Phone: [phone] Subjective:      CC: Back pain  QMV:[id number]  Collin [name] is a 77 y.o. male coming in with complaint of  Neck pain and left leg weakness.  Patient had seen other providers including neurosurgery who decided to try to treat conservatively.  Patient did have an MRI that was independently visualized by me showing significant amount of degenerative disc disease, severe spinal stenosis as well as an L5 and S1 nerve root impingement on the left side.  Patient is concerned because he is noticing increasing weakness of the left leg that makes patient feel unstable.  Having difficulty also with picking up his large toe on the left foot.  Patient states that there is sometimes a numbness but mostly is the weakness that is concerning.  States that he has changed certain daily activities at the moment.  Also has changed things such as golf where he is now taking a cart instead of walking.  Rates the pain in the back is more of a 1 out of 10 but is more concerned of the weakness of the lower extremity.     Past Medical History:  Diagnosis Date  . Allergy   . BPH (benign prostatic hyperplasia)   . CAD (coronary artery disease)   . Cataract   . Diverticulosis of colon   . Hx of adenomatous colonic polyps 05/22/2014  . Hyperlipidemia   . Hypertension   . MYOCARDIAL INFARCTION, HX OF 07/20/2006   Qualifier: Diagnosis of  By: Leanne Chang MD, Bruce     Past Surgical History:  Procedure Laterality Date  . CATARACT EXTRACTION     Bil  . COLONOSCOPY    . PTCA     Social History   Socioeconomic History  . Marital status: Married    Spouse name: Not on file  . Number of children: 3  . Years of education: 39  . Highest education level: Not on file  Occupational History  . Occupation: retired  Scientific laboratory technician  . Financial resource strain: Not on file  . Food insecurity:    Worry: Not on file   Inability: Not on file  . Transportation needs:    Medical: Not on file    Non-medical: Not on file  Tobacco Use  . Smoking status: Never Smoker  . Smokeless tobacco: Never Used  Substance and Sexual Activity  . Alcohol use: Yes    Alcohol/week: 0.6 oz    Types: 1 Glasses of wine per week    Comment: Occasional beer.  . Drug use: No  . Sexual activity: Not on file  Lifestyle  . Physical activity:    Days per week: Not on file    Minutes per session: Not on file  . Stress: Not on file  Relationships  . Social connections:    Talks on phone: Not on file    Gets together: Not on file    Attends religious service: Not on file    Active member of club or organization: Not on file    Attends meetings of clubs or organizations: Not on file    Relationship status: Not on file  Other Topics Concern  . Not on file  Social History Narrative   Lives with wife in a 2 story home.  Has 3 children.     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    Education: college.  Occasional beer, no smoking, no drugs   Allergies  Allergen Reactions  . Penicillins     REACTION: rash   Family History  Problem Relation Age of Onset  . Heart disease Mother        CABG  . Stroke Father   . Kidney disease Father        renal failure  . AAA (abdominal aortic aneurysm) Father   . Hypertension Sister   . Diabetes Brother   . AAA (abdominal aortic aneurysm) Paternal Uncle   . Colon cancer Neg Hx      Past medical history, social, surgical and family history all reviewed in electronic medical record.  No pertanent information unless stated regarding to the chief complaint.   Review of Systems:Review of systems updated and as accurate as of 07/19/17  No headache, visual changes, nausea, vomiting, diarrhea, constipation, dizziness, abdominal pain, skin rash, fevers, chills, night sweats, weight loss, swollen lymph nodes, body aches, joint swelling, chest pain, shortness of breath,  mood changes.  Positive muscle aches  Objective  Blood pressure 134/78, pulse 62, height 6' 1.5" (1.867 m), weight 192 lb (87.1 kg), SpO2 96 %. Systems examined below as of 07/19/17   General: No apparent distress alert and oriented x3 mood and affect normal, dressed appropriately.  HEENT: Pupils equal, extraocular movements intact  Respiratory: Patient's speak in full sentences and does not appear short of breath  Cardiovascular: No lower extremity edema, non tender, no erythema  Skin: Warm dry intact with no signs of infection or rash on extremities or on axial skeleton.  Abdomen: Soft nontender  Neuro: Cranial nerves II through XII are intact, neurovascularly intact in all extremities  and 2+ pulses.  Lymph: No lymphadenopathy of posterior or anterior cervical chain or axillae bilaterally.  Gait mild antalgic with a left Trendelenburg MSK:  tender with full range of motion and good stability and symmetric strength and tone of shoulders, elbows, wrist, hip, knee and ankles bilaterally.  Arthritic changes Patient's back exam shows loss of lordosis with some mild degenerative scoliosis noted.  Patient has nontender on exam of the moment.  Near full range of motion but does have some stiffness.  Patient's left leg does show the patient has less tonicity of the left calf compared to the right calf.  Patient does have weakness in the L5 and S1 distributions of 4 out of 5 strength compared to the contralateral side.  Neurovascularly intact with no peripheral neuropathy stated that is severe.    Impression and Recommendations:     This case required medical decision making of moderate complexity.      Note: This dictation was prepared with Dragon dictation along with smaller phrase technology. Any transcriptional errors that result from this process are unintentional.

## 2017-07-19 ENCOUNTER — Encounter: Payer: Self-pay | Admitting: Family Medicine

## 2017-07-19 ENCOUNTER — Ambulatory Visit (INDEPENDENT_AMBULATORY_CARE_PROVIDER_SITE_OTHER): Payer: PPO | Admitting: Family Medicine

## 2017-07-19 VITALS — BP 134/78 | HR 62 | Ht 73.5 in | Wt 192.0 lb

## 2017-07-19 DIAGNOSIS — M5416 Radiculopathy, lumbar region: Secondary | ICD-10-CM | POA: Diagnosis not present

## 2017-07-19 MED ORDER — GABAPENTIN 100 MG PO CAPS
200.0000 mg | ORAL_CAPSULE | Freq: Every day | ORAL | 3 refills | Status: DC
Start: 2017-07-19 — End: 2017-12-28

## 2017-07-19 NOTE — Patient Instructions (Signed)
Good to see you  Ice 20 minutes 2 times daily. Usually after activity and before bed. We will try a L5 nerve root injection on the left to help with the pain in the leg  Gabapentin 200mg  at night Over the counter get B12 1072mcg daily  B6 100mg  daily  See me again then 2-3 weeks AFTER the injection  OK to do machine weights and golf if wife asks.

## 2017-07-19 NOTE — Assessment & Plan Note (Signed)
I do believe the patient has a left-sided leg weakness is secondary to a long-standing nerve root impingement.  Patient's weakness seems to be more in the L5 and S1 distribution on the left side.  This is corresponds with patient's most recent MRI.  EMG also shows an L5 radiculopathy that is worse left greater than right.  Patient does have known severe spinal stenosis as well.  We discussed with patient that I feel that an epidural would be beneficial.  He has already met with neurosurgery who did not want to do surgical intervention.  Patient will also start on gabapentin.  Warned of potential side effects.  Follow-up with me again in 2 weeks after the epidural to see how patient responds.

## 2017-07-26 ENCOUNTER — Ambulatory Visit
Admission: RE | Admit: 2017-07-26 | Discharge: 2017-07-26 | Disposition: A | Discharge status: Home / Self Care | Payer: PPO | Source: Ambulatory Visit | Attending: Family Medicine | Admitting: Family Medicine

## 2017-07-26 DIAGNOSIS — M47817 Spondylosis without myelopathy or radiculopathy, lumbosacral region: Secondary | ICD-10-CM | POA: Diagnosis not present

## 2017-07-26 DIAGNOSIS — M5416 Radiculopathy, lumbar region: Secondary | ICD-10-CM

## 2017-07-26 MED ORDER — METHYLPREDNISOLONE ACETATE 40 MG/ML INJ SUSP (RADIOLOG
120.0000 mg | Freq: Once | INTRAMUSCULAR | Status: AC
  Administered 2017-07-26: 120 mg via EPIDURAL

## 2017-07-26 MED ORDER — IOPAMIDOL (ISOVUE-M 200) INJECTION 41%
1.0000 mL | Freq: Once | INTRAMUSCULAR | Status: AC
  Administered 2017-07-26: 1 mL via EPIDURAL

## 2017-07-26 NOTE — Discharge Instructions (Signed)

## 2017-08-22 NOTE — Progress Notes (Signed)
[facility] [address] [place], [place] [address] Phone: [phone] Subjective:    I'm seeing this patient by the request  of:    CC: Left leg weakness  HRC:[id number]  Collin [name] is a 77 y.o. male coming in with complaint of back pain. States that his back is the same as last visit. Still has drop foot.  Patient was found to have a nerve root impingement and attempted an epidural injection.  Patient states that it did feel better his back pain is improved but the footdrop did not seem to improve.  Started gabapentin on June 25 as well.  Has not noticed any significant improvement with that.  Patient has been trying some of the vitamins also has not noticed an improvement.  Patient does have a nerve conduction study also showing peripheral neuropathy consistent with a chronic sensorial polyneuropathy.  Patient does not want to undergo back surgery if possible.    Past Medical History:  Diagnosis Date  . Allergy   . BPH (benign prostatic hyperplasia)   . CAD (coronary artery disease)   . Cataract   . Diverticulosis of colon   . Hx of adenomatous colonic polyps 05/22/2014  . Hyperlipidemia   . Hypertension   . MYOCARDIAL INFARCTION, HX OF 07/20/2006   Qualifier: Diagnosis of  By: Leanne Chang MD, Bruce     Past Surgical History:  Procedure Laterality Date  . CATARACT EXTRACTION     Bil  . COLONOSCOPY    . PTCA     Social History   Socioeconomic History  . Marital status: Married    Spouse name: Not on file  . Number of children: 3  . Years of education: 71  . Highest education level: Not on file  Occupational History  . Occupation: retired  Scientific laboratory technician  . Financial resource strain: Not on file  . Food insecurity:    Worry: Not on file    Inability: Not on file  . Transportation needs:    Medical: Not on file    Non-medical: Not on file  Tobacco Use  . Smoking status: Never Smoker  . Smokeless tobacco: Never Used  Substance and  Sexual Activity  . Alcohol use: Yes    Alcohol/week: 0.6 oz    Types: 1 Glasses of wine per week    Comment: Occasional beer.  . Drug use: No  . Sexual activity: Not on file  Lifestyle  . Physical activity:    Days per week: Not on file    Minutes per session: Not on file  . Stress: Not on file  Relationships  . Social connections:    Talks on phone: Not on file    Gets together: Not on file    Attends religious service: Not on file    Active member of club or organization: Not on file    Attends meetings of clubs or organizations: Not on file    Relationship status: Not on file  Other Topics Concern  . Not on file  Social History Narrative   Lives with wife in a 2 story home.  Has 3 children.     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    Education: college.    Occasional beer, no smoking, no drugs   Allergies  Allergen Reactions  . Penicillins     REACTION: rash   Family History  Problem Relation Age of Onset  . Heart disease Mother  CABG  . Stroke Father   . Kidney disease Father        renal failure  . AAA (abdominal aortic aneurysm) Father   . Hypertension Sister   . Diabetes Brother   . AAA (abdominal aortic aneurysm) Paternal Uncle   . Colon cancer Neg Hx      Past medical history, social, surgical and family history all reviewed in electronic medical record.  No pertanent information unless stated regarding to the chief complaint.   Review of Systems:Review of systems updated   No headache, visual changes, nausea, vomiting, diarrhea, constipation, dizziness, abdominal pain, skin rash, fevers, chills, night sweats, weight loss, swollen lymph nodes, body aches, joint swelling, muscle aches, chest pain, shortness of breath, mood changes.   Objective  Blood pressure (!) 160/74, pulse 68, height 6\' 1"  (1.854 m), weight 193 lb (87.5 kg), SpO2 95 %.   General: No apparent distress alert and oriented x3 mood and affect normal, dressed  appropriately.  HEENT: Pupils equal, extraocular movements intact  Respiratory: Patient's speak in full sentences and does not appear short of breath  Cardiovascular: No lower extremity edema, non tender, no erythema  Skin: Warm dry intact with no signs of infection or rash on extremities or on axial skeleton.  Abdomen: Soft nontender  Neuro: Cranial nerves II through XII are intact, neurovascularly intact in all extremities with 1+ pulses of the lower extremities Lymph: No lymphadenopathy of posterior or anterior cervical chain or axillae bilaterally.  Gait mild antalgic MSK:  Non tender with full range of motion and good stability and symmetric strength and tone of shoulders, elbows, wrist, hip, knee and ankles bilaterally.  Chronic changes of multiple joints Back exam shows some loss of lordosis with some mild degenerative scoliosis.  Mild tenderness to palpation over the L5-S1 area on the left side.  Negative straight leg test noted today.  Patient does have 4 out of 5 strength of hip flexion and only has 3+ out of 5 strength of dorsiflexion of the large toe.  Deep tendon reflexes 1+ in the Achilles    Impression and Recommendations:     This case required medical decision making of moderate complexity.      Note: This dictation was prepared with Dragon dictation along with smaller phrase technology. Any transcriptional errors that result from this process are unintentional.

## 2017-08-24 ENCOUNTER — Ambulatory Visit (INDEPENDENT_AMBULATORY_CARE_PROVIDER_SITE_OTHER): Payer: PPO | Admitting: Family Medicine

## 2017-08-24 ENCOUNTER — Encounter: Payer: Self-pay | Admitting: Family Medicine

## 2017-08-24 VITALS — BP 160/74 | HR 68 | Ht 73.0 in | Wt 193.0 lb

## 2017-08-24 DIAGNOSIS — G8929 Other chronic pain: Secondary | ICD-10-CM | POA: Diagnosis not present

## 2017-08-24 DIAGNOSIS — G609 Hereditary and idiopathic neuropathy, unspecified: Secondary | ICD-10-CM | POA: Diagnosis not present

## 2017-08-24 DIAGNOSIS — M5416 Radiculopathy, lumbar region: Secondary | ICD-10-CM | POA: Diagnosis not present

## 2017-08-24 DIAGNOSIS — M549 Dorsalgia, unspecified: Secondary | ICD-10-CM | POA: Diagnosis not present

## 2017-08-24 NOTE — Patient Instructions (Signed)
Good to see you  Exercises 3 times a week.  PT will be calling you  Also choline 500mg  daily could help with your cognitive problems. We will test the blood flow in the legs to make sure ok  See me again in 6-7 weeks

## 2017-08-25 ENCOUNTER — Encounter: Payer: Self-pay | Admitting: Family Medicine

## 2017-08-25 NOTE — Assessment & Plan Note (Signed)
Still with the weakness is likely secondary to the lumbar radiculopathy with it being mostly unilateral.  Patient does have the peripheral neuropathy that complicates the picture.  Patient has done formal physical therapy and does not want to do it again.  Discussed with patient about the gabapentin which he is only been using occasionally.  Discussed with him about potentially repeating the injection again which patient did not been working made significant improvement.  He does have some mild vascular decrease in the dorsalis pedis pulse noted today.  Deep tendon reflex in the left side is decreased as well.  Would like to get a Doppler and ABIs of the lower extremities to further evaluate.  Follow-up again in 4 weeks Spent  25 minutes with patient face-to-face and had greater than 50% of counseling including as described above in assessment and plan.

## 2017-08-25 NOTE — Assessment & Plan Note (Signed)
Encourage patient to continue the gabapentin.

## 2017-09-13 ENCOUNTER — Telehealth: Payer: Self-pay | Admitting: Internal Medicine

## 2017-09-13 ENCOUNTER — Other Ambulatory Visit: Payer: Self-pay

## 2017-09-13 ENCOUNTER — Other Ambulatory Visit: Payer: Self-pay | Admitting: *Deleted

## 2017-09-13 ENCOUNTER — Ambulatory Visit: Payer: PPO | Attending: Family Medicine

## 2017-09-13 DIAGNOSIS — M545 Low back pain: Secondary | ICD-10-CM | POA: Diagnosis not present

## 2017-09-13 DIAGNOSIS — M6281 Muscle weakness (generalized): Secondary | ICD-10-CM | POA: Diagnosis not present

## 2017-09-13 DIAGNOSIS — M79605 Pain in left leg: Secondary | ICD-10-CM

## 2017-09-13 DIAGNOSIS — R293 Abnormal posture: Secondary | ICD-10-CM

## 2017-09-13 DIAGNOSIS — M5416 Radiculopathy, lumbar region: Secondary | ICD-10-CM

## 2017-09-13 DIAGNOSIS — G8929 Other chronic pain: Secondary | ICD-10-CM

## 2017-09-13 NOTE — Telephone Encounter (Signed)
Spoke to pt, he is unable to do this time so he will call [facility] to reschedule appt.

## 2017-09-13 NOTE — Therapy (Signed)
[place], [place], [address] Phone: [phone]   Fax:  [phone]  Physical Therapy Evaluation  Patient Details  Name: Collin [name] MRN: [id number] Date of Birth: 11/18/1940 Referring Provider: Hulan Saas DO   Encounter Date: 09/13/2017  PT End of Session - 09/13/17 1602    Visit Number  1    Number of Visits  10    Date for PT Re-Evaluation  10/14/17    Authorization Type  [employer]       Authorization Time Period  KX at visit 15   progress note at visit 10    PT Start Time  0215    PT Stop Time  0300    PT Time Calculation (min)  45 min    Activity Tolerance  Patient tolerated treatment well    Behavior During Therapy  [facility] for tasks assessed/performed       Past Medical History:  Diagnosis Date  . Allergy   . BPH (benign prostatic hyperplasia)   . CAD (coronary artery disease)   . Cataract   . Diverticulosis of colon   . Hx of adenomatous colonic polyps 05/22/2014  . Hyperlipidemia   . Hypertension   . MYOCARDIAL INFARCTION, HX OF 07/20/2006   Qualifier: Diagnosis of  By: Leanne Chang MD, Bruce      Past Surgical History:  Procedure Laterality Date  . CATARACT EXTRACTION     Bil  . COLONOSCOPY    . PTCA      There were no vitals filed for this visit.   Subjective Assessment - 09/13/17 1340    Subjective  He reports LBP and has been seen by neurologist and found nerve damage  and had MRI.  May do a second injection. He reports Lt leg getting weaker. He does have mild pain. Reports balance issues.      Pertinent History  Injection with no benefit.     Limitations  Walking    Diagnostic tests  MRI; Degenerative changes  and disc herniation    Patient Stated Goals  He would like to know weakness and proper exercise.   If activities doing now are harmful.     Currently in Pain?  Yes    Pain Score  3    last pain in PM    Pain Location  Back    Pain Orientation  Right;Left;Lower     Pain Descriptors / Indicators  Aching    Pain Type  Chronic pain    Pain Onset  More than a month ago    Pain Frequency  Intermittent    Aggravating Factors   Leaning to put and swing club.   work    Pain Relieving Factors  straighten and stretch         [facility] PT Assessment - 09/13/17 0001      Assessment   Referring Provider  Hulan Saas DO    Onset Date/Surgical Date  --   years but drop foot started 4-5 years ago   Next MD Visit  As needed    Prior Therapy  Maybe in past      Precautions   Precautions  None    Precaution Comments  Limit golf      Restrictions   Weight Bearing Restrictions  No      Balance Screen   Has the patient fallen in the past 6 months  No      Prior Function   Level  of Independence  Independent      Cognition   Overall Cognitive Status  Within Functional Limits for tasks assessed      Posture/Postural Control   Posture Comments  dcr lumbar lordosis and Lt ilia higher and RT scapula more posterior      ROM / Strength   AROM / PROM / Strength  AROM;PROM;Strength      AROM   AROM Assessment Site  Lumbar    Lumbar Flexion  100    Lumbar Extension  20    Lumbar - Right Side Bend  15    Lumbar - Left Side Bend  15      Strength   Overall Strength Comments  Weakness LT hip abduction  3-/5 , extension 4/5     LT ankle DF  3+/5   inversion /eversion 4/5,        Flexibility   Soft Tissue Assessment /Muscle Length  yes    Hamstrings  90 degrees bilaterally      Ambulation/Gait   Gait Comments  Independent with no device foot slap on LT.                 Objective measurements completed on examination: See above findings.                PT Short Term Goals - 09/13/17 1606      PT SHORT TERM GOAL #1   Title  He will be independent with initial HEP    Time  2    Period  Weeks    Status  New        PT Long Term Goals - 09/13/17 1606      PT LONG TERM GOAL #1   Title  He will be independent with all hEP  issued     Time  5    Period  Weeks      PT LONG TERM GOAL #2   Title  He will be able to abduct with 4+/5 strength to improve balance and walking    Time  5    Period  Weeks      PT LONG TERM GOAL #3   Title  He will improve DF to 4-/5 strength to improve gait stability    Time  5    Period  Weeks    Status  New      PT LONG TERM GOAL #4   Title  He will be able to stand single leg > 5 sec to demo improved balance on feet. and decr risk of fall    Time  5    Period  Weeks    Status  New             Plan - 09/13/17 1604    Clinical Impression Statement  Mr Girdler presents reporting weakness inLT LE and decr balance.     Clinical Presentation  Stable    Clinical Decision Making  Low    Rehab Potential  Good    PT Frequency  2x / week    PT Duration  6 weeks    PT Treatment/Interventions  Manual techniques;Patient/family education;Therapeutic exercise;Electrical Stimulation;Moist Heat;Therapeutic activities;Neuromuscular re-education    PT Next Visit Plan  HEP for hip and ankle strength, modalities if pain, balance    PT Home Exercise Plan  DF active     Consulted and Agree with Plan of Care  Patient       Patient will benefit from skilled  therapeutic intervention in order to improve the following deficits and impairments:  Pain, Decreased activity tolerance, Decreased strength, Difficulty walking, Postural dysfunction  Visit Diagnosis: Chronic bilateral low back pain, with sciatica presence unspecified  Abnormal posture  Muscle weakness (generalized)     Problem List Patient Active Problem List   Diagnosis Date Noted  . Lumbar radiculopathy 07/19/2017  . Peripheral neuropathy 12/06/2016  . Hx of adenomatous colonic polyps 05/22/2014  . Exertional angina ([place]) 09/28/2011  . Left inguinal hernia 09/10/2011  . PSA, INCREASED 08/05/2009  . CONGENITAL PIGMENTARY ANOMALY OF SKIN 07/25/2007  . Dyslipidemia 07/20/2006  . Coronary atherosclerosis 07/20/2006  .  BENIGN PROSTATIC HYPERTROPHY 07/20/2006    Darrel Hoover  PT 09/13/2017, 4:09 PM  [facility] [address] [place], [place], [address] Phone: [phone]   Fax:  [phone]  Name: Collin [name] MRN: [id number] Date of Birth: 03-22-1940

## 2017-09-13 NOTE — Telephone Encounter (Signed)
Signed encounter by accident. Please see below message.

## 2017-09-13 NOTE — Telephone Encounter (Signed)
lmovm for pt to return call.  ABI has been scheduled at [facility] 8.23.19 @ 930.

## 2017-09-13 NOTE — Addendum Note (Signed)
Addended by: Douglass Rivers T on: 09/13/2017 04:23 PM   Modules accepted: Orders

## 2017-09-13 NOTE — Telephone Encounter (Signed)
Copied from [place] [id number]. Topic: Quick Communication - See Telephone Encounter >> Sep 13, 2017  3:20 PM Bea Graff, NT wrote: CRM for notification. See Telephone encounter for: 09/13/17. Pt requesting a call from Dr. Tamala Julian or his nurse to discuss that during his last visit a test was mentioned to check the blood flow in his legs but he has not heard anything about setting this test up.

## 2017-09-16 ENCOUNTER — Encounter (HOSPITAL_COMMUNITY): Payer: PPO

## 2017-09-20 ENCOUNTER — Ambulatory Visit: Payer: PPO | Admitting: Physical Therapy

## 2017-09-20 DIAGNOSIS — M545 Low back pain: Principal | ICD-10-CM

## 2017-09-20 DIAGNOSIS — G8929 Other chronic pain: Secondary | ICD-10-CM

## 2017-09-20 DIAGNOSIS — M6281 Muscle weakness (generalized): Secondary | ICD-10-CM

## 2017-09-20 NOTE — Patient Instructions (Signed)
Access Code: [email or web address]  URL: [email or web address]  Date: 09/20/2017  Prepared by: Elsie Ra   Exercises  Supine Piriformis Stretch - 3 reps - 1 sets - 30 hold - 2x daily - 6x weekly  Supine Lower Trunk Rotation - 2-3 reps - 1 sets - 10 hold - 2x daily - 6x weekly  Sidelying Hip Abduction - 10 reps - 3 sets - 2x daily - 6x weekly  Prone Hip Extension - 10 reps - 2-3 sets - 2x daily - 6x weekly  Supine Active Straight Leg Raise - 10 reps - 3 sets - 2x daily - 6x weekly  Seated Ankle Inversion with Resistance - 10 reps - 3 sets - 2x daily - 6x weekly  Seated Ankle Dorsiflexion with Anchored Resistance - 10 reps - 3 sets - 2x daily - 6x weekly  Seated Ankle Eversion with Resistance - 10 reps - 3 sets - 2x daily - 6x weekly  Seated Eccentric Ankle Plantar Flexion with Resistance - Straight Leg - 10 reps - 3 sets - 2x daily - 6x weekly  Tandem Stance - 10 reps - 3 sets - 2x daily - 6x weekly  Single Leg Stance - 3 sets - 30 hold - 2x daily - 6x weekly  Standing Toe Raises at Chair - 10 reps - 2-3 sets - 2x daily - 6x weekly  Heel Walking - 10 reps - 3 sets - 2x daily - 6x weekly

## 2017-09-20 NOTE — Therapy (Addendum)
[place], [place], [address] Phone: [phone]   Fax:  [phone]  Physical Therapy Treatment/Discharge  Patient Details  Name: Collin [name] MRN: [id number] Date of Birth: 07-17-1940 Referring Provider: Hulan Saas DO   Encounter Date: 09/20/2017  PT End of Session - 09/20/17 1622    Visit Number  2    Number of Visits  10    Date for PT Re-Evaluation  10/14/17    PT Start Time  1502    PT Stop Time  1545    PT Time Calculation (min)  43 min    Activity Tolerance  Patient tolerated treatment well;No increased pain    Behavior During Therapy  WFL for tasks assessed/performed       Past Medical History:  Diagnosis Date  . Allergy   . BPH (benign prostatic hyperplasia)   . CAD (coronary artery disease)   . Cataract   . Diverticulosis of colon   . Hx of adenomatous colonic polyps 05/22/2014  . Hyperlipidemia   . Hypertension   . MYOCARDIAL INFARCTION, HX OF 07/20/2006   Qualifier: Diagnosis of  By: Leanne Chang MD, Bruce      Past Surgical History:  Procedure Laterality Date  . CATARACT EXTRACTION     Bil  . COLONOSCOPY    . PTCA      There were no vitals filed for this visit.  Subjective Assessment - 09/20/17 1556    Subjective  Pt relays no pain but wants to know what exercises he can do for stregthening for home    Currently in Pain?  No/denies                       [facility] Adult PT Treatment/Exercise - 09/20/17 0001      Self-Care   Self-Care  --   HEP for hip and ankle strength     Neuro Re-ed    Neuro Re-ed Details   balance training       Exercises   Exercises  Knee/Hip;Ankle      Knee/Hip Exercises: Standing   SLS  2 X 30 sec    Other Standing Knee Exercises  tandem stance 30 sec X 2 bilat    Other Standing Knee Exercises  toe raises 2X10, heel walking 15 ft X 6      Knee/Hip Exercises: Supine   Straight Leg Raises  Left;20 reps   flex, abd, ext     Ankle Exercises: Supine   T-Band  red 4 way X 20 ea             PT Education - 09/20/17 1621    Education Details  new HEP for hip and ankle strength    Person(s) Educated  Patient    Methods  Explanation;Demonstration;Verbal cues;Handout    Comprehension  Verbalized understanding;Returned demonstration       PT Short Term Goals - 09/13/17 1606      PT SHORT TERM GOAL #1   Title  He will be independent with initial HEP    Time  2    Period  Weeks    Status  New        PT Long Term Goals - 09/13/17 1606      PT LONG TERM GOAL #1   Title  He will be independent with all hEP issued     Time  5    Period  Weeks  PT LONG TERM GOAL #2   Title  He will be able to abduct with 4+/5 strength to improve balance and walking    Time  5    Period  Weeks      PT LONG TERM GOAL #3   Title  He will improve DF to 4-/5 strength to improve gait stability    Time  5    Period  Weeks    Status  New      PT LONG TERM GOAL #4   Title  He will be able to stand single leg > 5 sec to demo improved balance on feet. and decr risk of fall    Time  5    Period  Weeks    Status  New            Plan - 09/20/17 1623    Clinical Impression Statement  Pt was progressed with hip and ankle strengthening today as well as balance training with good tolerance. He was given updated HEP illustrated copy. He had no pain with anything today but does continue to have Leg weakness. He relays he wants to try HEP at home at first so it is unclear if he will return to PT however he was encouraged to due to his continued deficits.    Rehab Potential  Good    PT Frequency  2x / week    PT Duration  6 weeks    PT Treatment/Interventions  Manual techniques;Patient/family education;Therapeutic exercise;Electrical Stimulation;Moist Heat;Therapeutic activities;Neuromuscular re-education    PT Next Visit Plan  strength, balance    Consulted and Agree with Plan of Care  Patient       Patient will  benefit from skilled therapeutic intervention in order to improve the following deficits and impairments:  Pain, Decreased activity tolerance, Decreased strength, Difficulty walking, Postural dysfunction  Visit Diagnosis: Chronic bilateral low back pain, with sciatica presence unspecified  Muscle weakness (generalized)     Problem List Patient Active Problem List   Diagnosis Date Noted  . Lumbar radiculopathy 07/19/2017  . Peripheral neuropathy 12/06/2016  . Hx of adenomatous colonic polyps 05/22/2014  . Exertional angina ([place]) 09/28/2011  . Left inguinal hernia 09/10/2011  . PSA, INCREASED 08/05/2009  . CONGENITAL PIGMENTARY ANOMALY OF SKIN 07/25/2007  . Dyslipidemia 07/20/2006  . Coronary atherosclerosis 07/20/2006  . BENIGN PROSTATIC HYPERTROPHY 07/20/2006    Debbe Odea, PT, DPT 09/20/2017, 4:26 PM  [facility] [address] [place], [place], [address] Phone: [phone]   Fax:  [phone]  Name: Collin [name] MRN: [id number] Date of Birth: 01-Mar-1940   PHYSICAL THERAPY DISCHARGE SUMMARY  Visits from Start of Care: 2  Current functional level related to goals / functional outcomes: See above   Remaining deficits: See above  Plan: Patient agrees to discharge.  Patient goals were not met. Patient is being discharged due to not returning since the last visit.  ?????    Elsie Ra, PT, DPT 10/26/17 4:17 PM

## 2017-09-27 ENCOUNTER — Ambulatory Visit
Admission: RE | Admit: 2017-09-27 | Discharge: 2017-09-27 | Disposition: A | Discharge status: Home / Self Care | Payer: PPO | Source: Ambulatory Visit | Attending: Family Medicine | Admitting: Family Medicine

## 2017-09-27 ENCOUNTER — Other Ambulatory Visit: Payer: Self-pay | Admitting: Family Medicine

## 2017-09-27 DIAGNOSIS — M5416 Radiculopathy, lumbar region: Secondary | ICD-10-CM

## 2017-09-27 DIAGNOSIS — M47817 Spondylosis without myelopathy or radiculopathy, lumbosacral region: Secondary | ICD-10-CM | POA: Diagnosis not present

## 2017-09-27 MED ORDER — METHYLPREDNISOLONE ACETATE 40 MG/ML INJ SUSP (RADIOLOG
120.0000 mg | Freq: Once | INTRAMUSCULAR | Status: AC
  Administered 2017-09-27: 120 mg via EPIDURAL

## 2017-09-27 MED ORDER — IOPAMIDOL (ISOVUE-M 200) INJECTION 41%
1.0000 mL | Freq: Once | INTRAMUSCULAR | Status: AC
  Administered 2017-09-27: 1 mL via EPIDURAL

## 2017-09-27 NOTE — Discharge Instructions (Signed)

## 2017-10-17 ENCOUNTER — Ambulatory Visit: Payer: PPO | Admitting: Cardiology

## 2017-11-02 ENCOUNTER — Ambulatory Visit (INDEPENDENT_AMBULATORY_CARE_PROVIDER_SITE_OTHER): Payer: PPO | Admitting: Family Medicine

## 2017-11-02 ENCOUNTER — Encounter: Payer: Self-pay | Admitting: Family Medicine

## 2017-11-02 ENCOUNTER — Encounter: Payer: PPO | Admitting: Family Medicine

## 2017-11-02 VITALS — BP 124/80 | HR 82 | Temp 98.1°F | Resp 12 | Ht 73.0 in | Wt 193.1 lb

## 2017-11-02 DIAGNOSIS — R2681 Unsteadiness on feet: Secondary | ICD-10-CM | POA: Diagnosis not present

## 2017-11-02 DIAGNOSIS — Z0000 Encounter for general adult medical examination without abnormal findings: Secondary | ICD-10-CM | POA: Diagnosis not present

## 2017-11-02 DIAGNOSIS — Z23 Encounter for immunization: Secondary | ICD-10-CM

## 2017-11-02 DIAGNOSIS — N401 Enlarged prostate with lower urinary tract symptoms: Secondary | ICD-10-CM | POA: Diagnosis not present

## 2017-11-02 DIAGNOSIS — R351 Nocturia: Secondary | ICD-10-CM | POA: Diagnosis not present

## 2017-11-02 DIAGNOSIS — I2510 Atherosclerotic heart disease of native coronary artery without angina pectoris: Secondary | ICD-10-CM | POA: Diagnosis not present

## 2017-11-02 DIAGNOSIS — E785 Hyperlipidemia, unspecified: Secondary | ICD-10-CM | POA: Diagnosis not present

## 2017-11-02 DIAGNOSIS — G609 Hereditary and idiopathic neuropathy, unspecified: Secondary | ICD-10-CM

## 2017-11-02 DIAGNOSIS — K4090 Unilateral inguinal hernia, without obstruction or gangrene, not specified as recurrent: Secondary | ICD-10-CM

## 2017-11-02 DIAGNOSIS — G250 Essential tremor: Secondary | ICD-10-CM | POA: Insufficient documentation

## 2017-11-02 NOTE — Patient Instructions (Addendum)
A few things to remember from today's visit:   BPH associated with nocturia  Essential tremor  Unstable gait  Routine general medical examination at a health care facility A few tips:  -As we age balance is not as good as it was, so there is a higher risks for falls. Please remove small rugs and furniture that is "in your way" and could increase the risk of falls. Stretching exercises may help with fall prevention: Yoga and Tai Chi are some examples. Low impact exercise is better, so you are not very achy the next day.  -Sun screen and avoidance of direct sun light recommended. Caution with dehydration, if working outdoors be sure to drink enough fluids.  - Some medications are not safe as we age, increases the risk of side effects and can potentially interact with other medication you are also taken;  including some of over the counter medications. Be sure to let me know when you start a new medication even if it is a dietary/vitamin supplement.   -Healthy diet low in red meet/animal fat and sugar + regular physical activity is recommended.       Please be sure medication list is accurate. If a new problem present, please set up appointment sooner than planned today.

## 2017-11-02 NOTE — Assessment & Plan Note (Signed)
No changes in Zocor 40 mg daily. Further recommendation will be given according to lipid results. Today he is not fasting, his comment for fasting labs next week.

## 2017-11-02 NOTE — Assessment & Plan Note (Signed)
Medications and some of his chronic medical problems can aggravate problem. Educated about fall precautions.

## 2017-11-02 NOTE — Assessment & Plan Note (Signed)
Symptoms are stable. He is not interested in doing PSA or UA. We will continue monitoring.

## 2017-11-02 NOTE — Assessment & Plan Note (Signed)
Asymptomatic. He will continue following with cardiologist, he has an appointment with Dr. Burt Knack early next year.

## 2017-11-02 NOTE — Assessment & Plan Note (Signed)
We discussed some side effects of gabapentin. Given the fact problem is not severe and has been a stable, recommend continuing monitoring for now. Adequate skin care and avoidance of trauma. Follow-up as needed.

## 2017-11-02 NOTE — Assessment & Plan Note (Signed)
History and physical examination suggest a benign process. We discussed pharmacologic options, he is not interested in trying medication at this time. Instructed about warning signs. If problem gets worse, we will need to arrange appointment with Dr. Posey Pronto.

## 2017-11-02 NOTE — Assessment & Plan Note (Signed)
Asymptomatic. Recommend general surgery evaluation, referral placed. Instructed about warning signs.

## 2017-11-02 NOTE — Progress Notes (Signed)
HPI:   [name] is a 77 y.o. male, who is here today to establish care.  Former PCP: Dr. Burnice [name]. Last preventive routine visit: 09/2016.  Chronic medical problems: CAD s/p angioplasty, unstable gait,foot drop,neuropathy, and BPH among some.  He follows periodically with cardiologist, Dr Collin [name]; neurologist,Dr Collin [name];sport medicine, Dr Collin [name].   Concerns today: He wants to have his physical today and he has other concerns he would like to addressed.   He lives with his wife. Independent ADL's and IADL's.   Functional Status Survey: Is the patient deaf or have difficulty hearing?: Yes(Hearing aids) Does the patient have difficulty seeing, even when wearing glasses/contacts?: No Does the patient have difficulty concentrating, remembering, or making decisions?: Yes Does the patient have difficulty walking or climbing stairs?: No Does the patient have difficulty dressing or bathing?: No Does the patient have difficulty doing errands alone such as visiting a doctor's office or shopping?: No  Fall Risk  11/02/2017 01/26/2017 10/14/2015 10/08/2014 10/02/2013  Falls in the past year? No No No No No     Depression screen PHQ 2/9 11/02/2017  Decreased Interest 0  Down, Depressed, Hopeless 0  PHQ - 2 Score 0     No exam data present  Review of Systems  Constitutional: Negative for activity change, appetite change, fatigue and fever.  HENT: Positive for hearing loss. Negative for dental problem, nosebleeds, sore throat, trouble swallowing and voice change.   Eyes: Negative for redness and visual disturbance.  Respiratory: Negative for cough, shortness of breath and wheezing.   Cardiovascular: Negative for chest pain, palpitations and leg swelling.  Gastrointestinal: Negative for abdominal pain, nausea and vomiting.       No changes in bowel habits.  Endocrine: Negative for polydipsia, polyphagia and polyuria.  Genitourinary: Negative for decreased urine volume,  dysuria, genital sores, hematuria and testicular pain.  Musculoskeletal: Positive for arthralgias and gait problem.  Skin: Negative for rash and wound.  Neurological: Positive for tremors and numbness. Negative for syncope, weakness and headaches.  Hematological: Negative for adenopathy. Does not bruise/bleed easily.  Psychiatric/Behavioral: Negative for confusion and sleep disturbance. The patient is nervous/anxious.       Current Outpatient Medications on File Prior to Visit  Medication Sig Dispense Refill  . aspirin 81 MG tablet Take 81 mg by mouth daily.      . Cholecalciferol (VITAMIN D) 2000 UNITS tablet Take 4,000 Units by mouth daily.     Marland Kitchen co-enzyme Q-10 30 MG capsule Take 30 mg by mouth daily.      . fish oil-omega-3 fatty acids 1000 MG capsule Take 2 g by mouth daily.      Marland Kitchen gabapentin (NEURONTIN) 100 MG capsule Take 2 capsules (200 mg total) by mouth at bedtime. 60 capsule 3  . Glucosamine-Chondroitin (MOVE FREE PO) Take 1 tablet by mouth daily.      . metoprolol succinate (TOPROL-XL) 25 MG 24 hr tablet TAKE 1 TABLET BY MOUTH ONCE DAILY 90 tablet 2  . Multiple Vitamin (MULTIVITAMIN) tablet Take 1 tablet by mouth daily.      Marland Kitchen Resveratrol 250 MG CAPS Take 1 capsule by mouth daily.      . Saw Palmetto, Serenoa repens, 450 MG CAPS Take 1 capsule by mouth 2 (two) times daily.      . simvastatin (ZOCOR) 40 MG tablet TAKE 1 TABLET BY MOUTH ONCE DAILY AT 6PM 90 tablet 2  . sulfacetamide (BLEPH-10) 10 % ophthalmic solution Place 2 drops every  4 (four) hours into the right eye. 15 mL 0  . nitroGLYCERIN (NITROSTAT) 0.4 MG SL tablet Place 1 tablet (0.4 mg total) under the tongue every 5 (five) minutes as needed for chest pain. 25 tablet 3   No current facility-administered medications on file prior to visit.      Past Medical History:  Diagnosis Date  . Allergy   . BPH (benign prostatic hyperplasia)   . CAD (coronary artery disease)   . Cataract   . Diverticulosis of colon   .  Hx of adenomatous colonic polyps 05/22/2014  . Hyperlipidemia   . Hypertension   . MYOCARDIAL INFARCTION, HX OF 07/20/2006   Qualifier: Diagnosis of  By: Collin Chang MD, Collin [name]     Allergies  Allergen Reactions  . Penicillins     REACTION: rash    Family History  Problem Relation Age of Onset  . Heart disease Mother        CABG  . Stroke Father   . Kidney disease Father        renal failure  . AAA (abdominal aortic aneurysm) Father   . Hypertension Sister   . Diabetes Brother   . AAA (abdominal aortic aneurysm) Paternal Uncle   . Colon cancer Neg Hx     Social History   Socioeconomic History  . Marital status: Married    Spouse name: Not on file  . Number of children: 3  . Years of education: 63  . Highest education level: Not on file  Occupational History  . Occupation: retired  Scientific laboratory technician  . Financial resource strain: Not on file  . Food insecurity:    Worry: Not on file    Inability: Not on file  . Transportation needs:    Medical: Not on file    Non-medical: Not on file  Tobacco Use  . Smoking status: Never Smoker  . Smokeless tobacco: Never Used  Substance and Sexual Activity  . Alcohol use: Yes    Alcohol/week: 1.0 standard drinks    Types: 1 Glasses of wine per week    Comment: Occasional beer.  . Drug use: No  . Sexual activity: Not on file  Lifestyle  . Physical activity:    Days per week: Not on file    Minutes per session: Not on file  . Stress: Not on file  Relationships  . Social connections:    Talks on phone: Not on file    Gets together: Not on file    Attends religious service: Not on file    Active member of club or organization: Not on file    Attends meetings of clubs or organizations: Not on file    Relationship status: Not on file  Other Topics Concern  . Not on file  Social History Narrative   Lives with wife in a 2 story home.  Has 3 children.     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    Education:  college.    Occasional beer, no smoking, no drugs    Vitals:   11/02/17 1420  BP: 124/80  Pulse: 82  Resp: 12  Temp: 98.1 F (36.7 C)  SpO2: 96%    Body mass index is 25.48 kg/m.   Physical Exam  Nursing note and vitals reviewed. Constitutional: He is oriented to person, place, and time. He appears well-developed and well-nourished. No distress.  HENT:  Head: Normocephalic and atraumatic.  Right Ear: Tympanic membrane, external ear and ear canal normal.  Left Ear: Tympanic membrane, external ear and ear canal normal.  Mouth/Throat: Oropharynx is clear and moist and mucous membranes are normal.  Eyes: Pupils are equal, round, and reactive to light. Conjunctivae and EOM are normal.  Neck: Normal range of motion. No thyroid mass present.  Cardiovascular: Normal rate and regular rhythm.  No murmur heard. Pulses:      Dorsalis pedis pulses are 2+ on the right side, and 2+ on the left side.  Respiratory: Effort normal and breath sounds normal. No respiratory distress.  GI: Soft. He exhibits no mass. There is no hepatomegaly. There is no tenderness. A hernia is present. Hernia confirmed positive in the left inguinal area. Hernia confirmed negative in the right inguinal area.  Genitourinary: Right testis shows no mass and no tenderness. Left testis shows no mass and no tenderness.  Musculoskeletal: He exhibits no edema or tenderness.  Lymphadenopathy:    He has no cervical adenopathy.       Right: No inguinal and no supraclavicular adenopathy present.       Left: No inguinal and no supraclavicular adenopathy present.  Neurological: He is alert and oriented to person, place, and time. He has normal strength. He displays tremor (Mild, on hands R>L.  No appreciated at rest.). No cranial nerve deficit. Gait abnormal.  Reflex Scores:      Bicep reflexes are 2+ on the right side and 2+ on the left side.      Patellar reflexes are 2+ on the right side and 2+ on the left side. Mildly  unstable gait, not assisted.  Skin: Skin is warm. No erythema.  Psychiatric: His mood appears anxious.  Well-groomed, good eye contact.      ASSESSMENT AND PLAN:   [name] was seen today for transfer of care and annual exam.  Orders Placed This Encounter  Procedures  . Flu vaccine HIGH DOSE PF  . Comprehensive metabolic panel  . Lipid panel  . Ambulatory referral to General Surgery     Routine general medical examination at a health care facility We discussed the importance of regular physical activity and healthy diet for prevention of chronic illness and/or complications. Preventive guidelines reviewed. Vaccination up to date, influenza vaccine given today. Fall prevention.  Aspirin for secondary prevention to continue,side effects discussed. Next CPE in a year.  Encounter for immunization -     Flu vaccine HIGH DOSE PF   Left inguinal hernia Asymptomatic. Recommend general surgery evaluation, referral placed. Instructed about warning signs.  BPH associated with nocturia Symptoms are stable. He is not interested in doing PSA or UA. We will continue monitoring.  Dyslipidemia No changes in Zocor 40 mg daily. Further recommendation will be given according to lipid results. Today he is not fasting, his comment for fasting labs next week.  Peripheral neuropathy We discussed some side effects of gabapentin. Given the fact problem is not severe and has been a stable, recommend continuing monitoring for now. Adequate skin care and avoidance of trauma. Follow-up as needed.  Essential tremor History and physical examination suggest a benign process. We discussed pharmacologic options, he is not interested in trying medication at this time. Instructed about warning signs. If problem gets worse, we will need to arrange appointment with Dr. Posey Pronto.  Unstable gait Medications and some of his chronic medical problems can aggravate problem. Educated about fall  precautions.  Coronary atherosclerosis Asymptomatic. He will continue following with cardiologist, he has an appointment with Dr. Burt [name] early next year.  Mckennah Kretchmer G. Martinique, MD  [facility]. [place] office.

## 2017-11-08 ENCOUNTER — Other Ambulatory Visit (INDEPENDENT_AMBULATORY_CARE_PROVIDER_SITE_OTHER): Payer: PPO

## 2017-11-08 DIAGNOSIS — E785 Hyperlipidemia, unspecified: Secondary | ICD-10-CM

## 2017-11-08 LAB — COMPREHENSIVE METABOLIC PANEL
ALK PHOS: 42 U/L (ref 39–117)
ALT: 24 U/L (ref 0–53)
AST: 26 U/L (ref 0–37)
Albumin: 4.1 g/dL (ref 3.5–5.2)
BUN: 15 mg/dL (ref 6–23)
CALCIUM: 9.3 mg/dL (ref 8.4–10.5)
CHLORIDE: 103 meq/L (ref 96–112)
CO2: 31 mEq/L (ref 19–32)
Creatinine, Ser: 1.08 mg/dL (ref 0.40–1.50)
GFR: 70.04 mL/min (ref >60.00)
Glucose, Bld: 101 mg/dL — ABNORMAL HIGH (ref 70–99)
POTASSIUM: 4.3 meq/L (ref 3.5–5.1)
SODIUM: 140 meq/L (ref 135–145)
TOTAL PROTEIN: 6.7 g/dL (ref 6.0–8.3)
Total Bilirubin: 0.8 mg/dL (ref 0.2–1.2)

## 2017-11-08 LAB — LIPID PANEL
CHOLESTEROL: 132 mg/dL (ref 0–200)
HDL: 58.7 mg/dL (ref >39.00)
LDL CALC: 54 mg/dL (ref 0–99)
NonHDL: 73.03
TRIGLYCERIDES: 93 mg/dL (ref 0.0–149.0)
Total CHOL/HDL Ratio: 2
VLDL: 18.6 mg/dL (ref 0.0–40.0)

## 2017-11-14 ENCOUNTER — Encounter: Payer: Self-pay | Admitting: Family Medicine

## 2017-11-24 DIAGNOSIS — K4090 Unilateral inguinal hernia, without obstruction or gangrene, not specified as recurrent: Secondary | ICD-10-CM | POA: Diagnosis not present

## 2017-12-06 DIAGNOSIS — M21372 Foot drop, left foot: Secondary | ICD-10-CM | POA: Diagnosis not present

## 2017-12-13 ENCOUNTER — Other Ambulatory Visit: Payer: Self-pay | Admitting: Neurological Surgery

## 2017-12-13 DIAGNOSIS — M21372 Foot drop, left foot: Secondary | ICD-10-CM

## 2017-12-24 ENCOUNTER — Ambulatory Visit
Admission: RE | Admit: 2017-12-24 | Discharge: 2017-12-24 | Disposition: A | Discharge status: Home / Self Care | Payer: PPO | Source: Ambulatory Visit | Attending: Neurological Surgery | Admitting: Neurological Surgery

## 2017-12-24 DIAGNOSIS — M48061 Spinal stenosis, lumbar region without neurogenic claudication: Secondary | ICD-10-CM | POA: Diagnosis not present

## 2017-12-24 DIAGNOSIS — M21372 Foot drop, left foot: Secondary | ICD-10-CM

## 2017-12-27 ENCOUNTER — Other Ambulatory Visit: Payer: Self-pay | Admitting: Neurological Surgery

## 2017-12-27 DIAGNOSIS — M21372 Foot drop, left foot: Secondary | ICD-10-CM | POA: Diagnosis not present

## 2017-12-28 ENCOUNTER — Telehealth: Payer: Self-pay | Admitting: *Deleted

## 2017-12-28 ENCOUNTER — Encounter: Payer: Self-pay | Admitting: Cardiovascular Disease

## 2017-12-28 ENCOUNTER — Ambulatory Visit: Payer: PPO | Admitting: Cardiovascular Disease

## 2017-12-28 DIAGNOSIS — I2510 Atherosclerotic heart disease of native coronary artery without angina pectoris: Secondary | ICD-10-CM

## 2017-12-28 MED ORDER — METOPROLOL SUCCINATE ER 25 MG PO TB24: 25.0000 mg | ORAL_TABLET | Freq: Every day | ORAL | 3 refills | Status: DC

## 2017-12-28 MED ORDER — SIMVASTATIN 40 MG PO TABS: ORAL_TABLET | ORAL | 3 refills | Status: DC

## 2017-12-28 MED ORDER — NITROGLYCERIN 0.4 MG SL SUBL: 0.4000 mg | SUBLINGUAL_TABLET | SUBLINGUAL | 3 refills | Status: DC | PRN

## 2017-12-28 NOTE — Patient Instructions (Signed)
Medication Instructions:  No change If you need a refill on your cardiac medications before your next appointment, please call your pharmacy.   Lab work: none If you have labs (blood work) drawn today and your tests are completely normal, you will receive your results only by: Marland Kitchen MyChart Message (if you have MyChart) OR . A paper copy in the mail If you have any lab test that is abnormal or we need to change your treatment, we will call you to review the results.  Testing/Procedures: none  Follow-Up: At [facility], you and your health needs are our priority.  As part of our continuing mission to provide you with exceptional heart care, we have created designated Provider Care Teams.  These Care Teams include your primary Cardiologist (physician) and Advanced Practice Providers (APPs -  Physician Assistants and Nurse Practitioners) who all work together to provide you with the care you need, when you need it. You will need a follow up appointment in:  24 months.  Please call our office 2 months in advance to schedule this appointment.  You may see Sherren Mocha, MD or one of the following Advanced Practice Providers on your designated Care Team: Richardson Dopp, PA-C [place], [place] . Daune Perch, NP  Any Other Special Instructions Will Be Listed Below (If Applicable).

## 2017-12-28 NOTE — Telephone Encounter (Signed)
   [facility] Pre-operative Risk Assessment    Request for surgical clearance:  1. What type of surgery is being performed? LEFT LF/S1 EXTRAFORAMINAL DECOMPRESSION   2. When is this surgery scheduled? 01/20/18   3. What type of clearance is required (medical clearance vs. Pharmacy clearance to hold med vs. Both)?  BOTH  4. Are there any medications that need to be held prior to surgery and how long? ASPIRIN   5. Practice name and name of physician performing surgery?  [facility]    6. What is your office phone number [phone]    7.   What is your office fax number [phone]  8.   Anesthesia type (None, local, MAC, general) ?  GENERAL   Jeanann Lewandowsky 12/28/2017, 9:57 AM  _________________________________________________________________   (provider comments below)

## 2017-12-28 NOTE — Telephone Encounter (Signed)
   Primary Cardiologist: Sherren Mocha, MD  Chart reviewed as part of pre-operative protocol coverage. Given past medical history and time since last visit, based on ACC/AHA guidelines, Jeriel Vivanco Rish would be at acceptable risk for the planned procedure without further cardiovascular testing.   Pt seen by Dr. Burt Knack in clinic today for evaluation. Per Dr. Burt Knack, "The patient is stable without symptoms of angina.  He can achieve a good workload well above 4 metabolic equivalents with no cardiopulmonary symptoms.  He is at low risk of cardiac complications related to planned lumbar surgery.  He should continue his beta-blocker in the perioperative period without interruption. He is ok to hold ASA 5-7 days preoperatively if needed"  I will route this recommendation to the requesting party via Epic fax function and remove from pre-op pool.  Please call with questions.  Lyda Jester, PA-C 12/28/2017, 4:50 PM

## 2017-12-28 NOTE — Progress Notes (Signed)
Cardiology Office Note:    Date:  12/28/2017   ID:  Collin [name], DOB 1940/04/18, MRN [id number]  PCP:  Martinique, Betty G, MD  Cardiologist:  Sherren Mocha, MD  Electrophysiologist:  None   Referring MD: Marletta Lor, MD   Chief Complaint  Patient presents with  . Coronary Artery Disease   History of Present Illness:    Collin [name] is a 77 y.o. male with a hx of coronary artery disease, presented for follow-up evaluation.  He has a history of remote inferior wall MI in 1998 treated with primary angioplasty of the RCA.  He also has known mid LAD stenosis and is treated medically for that.  He was started on a beta-blocker when he was diagnosed with significant LAD stenosis and he has had complete resolution of his exertional anginal symptoms.  The patient is here alone today.  He requires preoperative cardiovascular clearance for upcoming lumbar discectomy.  He is developed pain and weakness of the left leg with left foot drop.  He is not able to do as much walking as he has in the past because of his back problems.  However, he is still able to exercise on elliptical for up to 30 minutes.  He gets his heart rate up well above 120 bpm with no symptoms.  He specifically denies symptoms of chest pain, chest pressure, or shortness of breath with exertion.  He denies orthopnea, PND, or leg swelling.  Past Medical History:  Diagnosis Date  . Allergy   . BPH (benign prostatic hyperplasia)   . CAD (coronary artery disease)   . Cataract   . Diverticulosis of colon   . Hx of adenomatous colonic polyps 05/22/2014  . Hyperlipidemia   . Hypertension   . MYOCARDIAL INFARCTION, HX OF 07/20/2006   Qualifier: Diagnosis of  By: Leanne Chang MD, Bruce      Past Surgical History:  Procedure Laterality Date  . CATARACT EXTRACTION     Bil  . COLONOSCOPY    . PTCA      Current Medications: Current Meds  Medication Sig  . aspirin 81 MG tablet Take 81 mg by mouth daily.    .  Cholecalciferol (VITAMIN D) 2000 UNITS tablet Take 4,000 Units by mouth daily.   Marland Kitchen co-enzyme Q-10 30 MG capsule Take 30 mg by mouth daily.    . fish oil-omega-3 fatty acids 1000 MG capsule Take 2 g by mouth daily.    . Glucosamine-Chondroitin (MOVE FREE PO) Take 1 tablet by mouth daily.    . Methylcobalamin (B12-ACTIVE PO) Take by mouth daily.  . metoprolol succinate (TOPROL-XL) 25 MG 24 hr tablet Take 1 tablet (25 mg total) by mouth daily.  . Multiple Vitamin (MULTIVITAMIN) tablet Take 1 tablet by mouth daily.    . nitroGLYCERIN (NITROSTAT) 0.4 MG SL tablet Place 1 tablet (0.4 mg total) under the tongue every 5 (five) minutes as needed for chest pain.  Marland Kitchen Pyridoxine HCl (B-6 PO) Take by mouth daily.  Marland Kitchen Resveratrol 250 MG CAPS Take 1 capsule by mouth daily.    . Saw Palmetto, Serenoa repens, 450 MG CAPS Take 1 capsule by mouth 2 (two) times daily.    . simvastatin (ZOCOR) 40 MG tablet TAKE 1 TABLET BY MOUTH ONCE DAILY AT 6PM  . [DISCONTINUED] metoprolol succinate (TOPROL-XL) 25 MG 24 hr tablet TAKE 1 TABLET BY MOUTH ONCE DAILY  . [DISCONTINUED] nitroGLYCERIN (NITROSTAT) 0.4 MG SL tablet Place 1 tablet (0.4 mg total) under the tongue  every 5 (five) minutes as needed for chest pain.  . [DISCONTINUED] simvastatin (ZOCOR) 40 MG tablet TAKE 1 TABLET BY MOUTH ONCE DAILY AT 6PM     Allergies:   Penicillins   Social History   Socioeconomic History  . Marital status: Married    Spouse name: Not on file  . Number of children: 3  . Years of education: 42  . Highest education level: Not on file  Occupational History  . Occupation: retired  Scientific laboratory technician  . Financial resource strain: Not on file  . Food insecurity:    Worry: Not on file    Inability: Not on file  . Transportation needs:    Medical: Not on file    Non-medical: Not on file  Tobacco Use  . Smoking status: Never Smoker  . Smokeless tobacco: Never Used  Substance and Sexual Activity  . Alcohol use: Yes    Alcohol/week: 1.0  standard drinks    Types: 1 Glasses of wine per week    Comment: Occasional beer.  . Drug use: No  . Sexual activity: Not on file  Lifestyle  . Physical activity:    Days per week: Not on file    Minutes per session: Not on file  . Stress: Not on file  Relationships  . Social connections:    Talks on phone: Not on file    Gets together: Not on file    Attends religious service: Not on file    Active member of club or organization: Not on file    Attends meetings of clubs or organizations: Not on file    Relationship status: Not on file  Other Topics Concern  . Not on file  Social History Narrative   Lives with wife in a 2 story home.  Has 3 children.     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    Education: college.    Occasional beer, no smoking, no drugs     Family History: The patient's family history includes AAA (abdominal aortic aneurysm) in his father and paternal uncle; Diabetes in his brother; Heart disease in his mother; Hypertension in his sister; Kidney disease in his father; Stroke in his father. There is no history of Colon cancer.  ROS:   Please see the history of present illness.    Positive for tremor, leg pain, anxiety, balance problems.  All other systems reviewed and are negative.  EKGs/Labs/Other Studies Reviewed:    The following studies were reviewed today: Abdominal aortic ultrasound 08/21/2013: FINDINGS: Abdominal Aorta  No aneurysm identified.  Maximum AP  Diameter:  2.4 cm  Maximum TRV  Diameter: 2.4 cm  IMPRESSION: No evidence of abdominal aortic aneurysm.  EKG:  EKG is ordered today.  The ekg ordered today demonstrates normal sinus rhythm 72 bpm, first-degree AV block, otherwise within normal limits.  Recent Labs: 11/08/2017: ALT 24; BUN 15; Creatinine, Ser 1.08; Potassium 4.3; Sodium 140  Recent Lipid Panel    Component Value Date/Time   CHOL 132 11/08/2017 0841   TRIG 93.0 11/08/2017 0841   HDL  58.70 11/08/2017 0841   CHOLHDL 2 11/08/2017 0841   VLDL 18.6 11/08/2017 0841   LDLCALC 54 11/08/2017 0841    Physical Exam:    VS:  BP 124/80   Pulse 72   Ht 6\' 1"  (1.854 m)   Wt 199 lb 9.6 oz (90.5 kg)   SpO2 93%   BMI 26.33 kg/m     Wt Readings from Last  3 Encounters:  12/28/17 199 lb 9.6 oz (90.5 kg)  11/02/17 193 lb 2 oz (87.6 kg)  08/24/17 193 lb (87.5 kg)     GEN:  Well nourished, well developed in no acute distress HEENT: Normal NECK: No JVD; No carotid bruits LYMPHATICS: No lymphadenopathy CARDIAC: RRR, no murmurs, rubs, gallops RESPIRATORY:  Clear to auscultation without rales, wheezing or rhonchi  ABDOMEN: Soft, non-tender, non-distended MUSCULOSKELETAL:  No edema; No deformity  SKIN: Warm and dry NEUROLOGIC:  Alert and oriented x 3 PSYCHIATRIC:  Normal affect   ASSESSMENT:    1. Coronary artery disease involving native coronary artery of native heart without angina pectoris    PLAN:    In order of problems listed above:  The patient is stable without symptoms of angina.  He can achieve a good workload well above 4 metabolic equivalents with no cardiopulmonary symptoms.  He is at low risk of cardiac complications related to planned lumbar surgery.  He should continue his beta-blocker in the perioperative period without interruption. He is ok to hold ASA 5-7 days preoperatively if needed.  I will plan to see him back in 2 years for follow-up at his request.  He understands to contact me if any cardiac symptoms arise prior to that.  His lipids are followed by his primary care physician and he is treated with a statin drug.  LDL cholesterol is 54 mg/dL.  Medication Adjustments/Labs and Tests Ordered: Current medicines are reviewed at length with the patient today.  Concerns regarding medicines are outlined above.  Orders Placed This Encounter  Procedures  . EKG 12-Lead   Meds ordered this encounter  Medications  . nitroGLYCERIN (NITROSTAT) 0.4 MG SL tablet      Sig: Place 1 tablet (0.4 mg total) under the tongue every 5 (five) minutes as needed for chest pain.    Dispense:  25 tablet    Refill:  3  . simvastatin (ZOCOR) 40 MG tablet    Sig: TAKE 1 TABLET BY MOUTH ONCE DAILY AT 6PM    Dispense:  90 tablet    Refill:  3  . metoprolol succinate (TOPROL-XL) 25 MG 24 hr tablet    Sig: Take 1 tablet (25 mg total) by mouth daily.    Dispense:  90 tablet    Refill:  3    Patient Instructions  Medication Instructions:  No change If you need a refill on your cardiac medications before your next appointment, please call your pharmacy.   Lab work: none If you have labs (blood work) drawn today and your tests are completely normal, you will receive your results only by: Marland Kitchen MyChart Message (if you have MyChart) OR . A paper copy in the mail If you have any lab test that is abnormal or we need to change your treatment, we will call you to review the results.  Testing/Procedures: none  Follow-Up: At [facility], you and your health needs are our priority.  As part of our continuing mission to provide you with exceptional heart care, we have created designated Provider Care Teams.  These Care Teams include your primary Cardiologist (physician) and Advanced Practice Providers (APPs -  Physician Assistants and Nurse Practitioners) who all work together to provide you with the care you need, when you need it. You will need a follow up appointment in:  24 months.  Please call our office 2 months in advance to schedule this appointment.  You may see Sherren Mocha, MD or one of the following  Advanced Practice Providers on your designated Care Team: Richardson Dopp, PA-C [place], [place] . Daune Perch, NP  Any Other Special Instructions Will Be Listed Below (If Applicable).       Signed, Sherren Mocha, MD  12/28/2017 3:22 PM    [facility]

## 2017-12-28 NOTE — Progress Notes (Signed)
ekg 

## 2018-01-04 DIAGNOSIS — Z85828 Personal history of other malignant neoplasm of skin: Secondary | ICD-10-CM | POA: Diagnosis not present

## 2018-01-04 DIAGNOSIS — L853 Xerosis cutis: Secondary | ICD-10-CM | POA: Diagnosis not present

## 2018-01-04 DIAGNOSIS — Z23 Encounter for immunization: Secondary | ICD-10-CM | POA: Diagnosis not present

## 2018-01-04 DIAGNOSIS — L821 Other seborrheic keratosis: Secondary | ICD-10-CM | POA: Diagnosis not present

## 2018-01-04 DIAGNOSIS — L723 Sebaceous cyst: Secondary | ICD-10-CM | POA: Diagnosis not present

## 2018-01-04 DIAGNOSIS — L570 Actinic keratosis: Secondary | ICD-10-CM | POA: Diagnosis not present

## 2018-01-06 NOTE — Pre-Procedure Instructions (Signed)
Collin [name]  01/06/2018      [id number] [name], [address] AT [address] [place] [address] Phone: [phone] Fax: [facility] [place], [place] [address] Phone: [phone] Fax: [phone]    Your procedure is scheduled on Friday December 27.  Report to [facility] Admitting at 12:00 A.M.  Call this number if you have problems the morning of surgery:  [phone]   Remember:  Do not eat or drink after midnight.    Take these medicines the morning of surgery with A SIP OF WATER: Metoprolol (Toprol-XL)   7 days prior to surgery STOP taking any Aspirin(unless otherwise instructed by your surgeon), Aleve, Naproxen, Ibuprofen, Motrin, Advil, Goody's, BC's, all herbal medications, fish oil, and all vitamins     Do not wear jewelry  Do not wear lotions, powders, or colognes, or deodorant.  Do not shave 48 hours prior to surgery.  Men may shave face and neck.  Do not bring valuables to the hospital.  [facility] is not responsible for any belongings or valuables.  Contacts, dentures or bridgework may not be worn into surgery.  Leave your suitcase in the car.  After surgery it may be brought to your room.  For patients admitted to the hospital, discharge time will be determined by your treatment team.  Patients discharged the day of surgery will not be allowed to drive home.   Special instructions:    [place]- Preparing For Surgery  Before surgery, you can play an important role. Because skin is not sterile, your skin needs to be as free of germs as possible. You can reduce the number of germs on your skin by washing with CHG (chlorahexidine gluconate) Soap before surgery.  CHG is an antiseptic cleaner which kills germs and bonds with the skin to continue killing germs even after  washing.    Oral Hygiene is also important to reduce your risk of infection.  Remember - BRUSH YOUR TEETH THE MORNING OF SURGERY WITH YOUR REGULAR TOOTHPASTE  Please do not use if you have an allergy to CHG or antibacterial soaps. If your skin becomes reddened/irritated stop using the CHG.  Do not shave (including legs and underarms) for at least 48 hours prior to first CHG shower. It is OK to shave your face.  Please follow these instructions carefully.   1. Shower the NIGHT BEFORE SURGERY and the MORNING OF SURGERY with CHG.   2. If you chose to wash your hair, wash your hair first as usual with your normal shampoo.  3. After you shampoo, rinse your hair and body thoroughly to remove the shampoo.  4. Use CHG as you would any other liquid soap. You can apply CHG directly to the skin and wash gently with a scrungie or a clean washcloth.   5. Apply the CHG Soap to your body ONLY FROM THE NECK DOWN.  Do not use on open wounds or open sores. Avoid contact with your eyes, ears, mouth and genitals (private parts). Wash Face and genitals (private parts)  with your normal soap.  6. Wash thoroughly, paying special attention to the area where your surgery will be performed.  7. Thoroughly rinse your body with warm water from the neck down.  8. DO NOT shower/wash with your normal soap after using and rinsing off  the CHG Soap.  9. Pat yourself dry with a CLEAN TOWEL.  10. Wear CLEAN PAJAMAS to bed the night before surgery, wear comfortable clothes the morning of surgery  11. Place CLEAN SHEETS on your bed the night of your first shower and DO NOT SLEEP WITH PETS.    Day of Surgery:  Do not apply any deodorants/lotions.  Please wear clean clothes to the hospital/surgery center.   Remember to brush your teeth WITH YOUR REGULAR TOOTHPASTE.    Please read over the following fact sheets that you were given. Coughing and Deep Breathing, MRSA Information and Surgical Site Infection  Prevention

## 2018-01-09 ENCOUNTER — Encounter (HOSPITAL_COMMUNITY)
Admission: RE | Admit: 2018-01-09 | Discharge: 2018-01-09 | Disposition: A | Discharge status: Home / Self Care | Payer: PPO | Source: Ambulatory Visit | Attending: Neurological Surgery | Admitting: Neurological Surgery

## 2018-01-09 ENCOUNTER — Other Ambulatory Visit: Payer: Self-pay

## 2018-01-09 ENCOUNTER — Encounter (HOSPITAL_COMMUNITY): Payer: Self-pay

## 2018-01-09 ENCOUNTER — Ambulatory Visit (HOSPITAL_COMMUNITY)
Admission: RE | Admit: 2018-01-09 | Discharge: 2018-01-09 | Disposition: A | Discharge status: Home / Self Care | Payer: PPO | Source: Ambulatory Visit | Attending: Neurological Surgery | Admitting: Neurological Surgery

## 2018-01-09 DIAGNOSIS — M21379 Foot drop, unspecified foot: Secondary | ICD-10-CM

## 2018-01-09 DIAGNOSIS — Z01818 Encounter for other preprocedural examination: Secondary | ICD-10-CM | POA: Diagnosis not present

## 2018-01-09 HISTORY — DX: Unspecified osteoarthritis, unspecified site: M19.90

## 2018-01-09 LAB — SURGICAL PCR SCREEN
MRSA, PCR: NEGATIVE
Staphylococcus aureus: POSITIVE — AB

## 2018-01-09 LAB — CBC WITH DIFFERENTIAL/PLATELET
Abs Immature Granulocytes: 0.02 10*3/uL (ref 0.00–0.07)
BASOS ABS: 0 10*3/uL (ref 0.0–0.1)
Basophils Relative: 1 %
Eosinophils Absolute: 0.2 10*3/uL (ref 0.0–0.5)
Eosinophils Relative: 3 %
HCT: 48.1 % (ref 39.0–52.0)
Hemoglobin: 15.4 g/dL (ref 13.0–17.0)
Immature Granulocytes: 0 %
Lymphocytes Relative: 32 %
Lymphs Abs: 2.6 10*3/uL (ref 0.7–4.0)
MCH: 28.7 pg (ref 26.0–34.0)
MCHC: 32 g/dL (ref 30.0–36.0)
MCV: 89.6 fL (ref 80.0–100.0)
Monocytes Absolute: 0.8 10*3/uL (ref 0.1–1.0)
Monocytes Relative: 10 %
NRBC: 0 % (ref 0.0–0.2)
Neutro Abs: 4.2 10*3/uL (ref 1.7–7.7)
Neutrophils Relative %: 54 %
Platelets: 197 10*3/uL (ref 150–400)
RBC: 5.37 MIL/uL (ref 4.22–5.81)
RDW: 12.7 % (ref 11.5–15.5)
WBC: 7.9 10*3/uL (ref 4.0–10.5)

## 2018-01-09 LAB — PROTIME-INR
INR: 1.03
Prothrombin Time: 13.4 seconds (ref 11.4–15.2)

## 2018-01-09 LAB — BASIC METABOLIC PANEL
ANION GAP: 10 (ref 5–15)
BUN: 15 mg/dL (ref 8–23)
CO2: 24 mmol/L (ref 22–32)
Calcium: 9.2 mg/dL (ref 8.9–10.3)
Chloride: 104 mmol/L (ref 98–111)
Creatinine, Ser: 1.18 mg/dL (ref 0.61–1.24)
GFR calc Af Amer: >60 mL/min (ref >60)
GFR calc non Af Amer: 58 mL/min — ABNORMAL LOW (ref >60)
Glucose, Bld: 123 mg/dL — ABNORMAL HIGH (ref 70–99)
Potassium: 4.3 mmol/L (ref 3.5–5.1)
Sodium: 138 mmol/L (ref 135–145)

## 2018-01-09 NOTE — Progress Notes (Signed)
PCP is Dr. Betty Martinique  LOV 09/2017 Cardio is Dr. Ezzie Dural  LOV 12/2017.  There is clearance note in Epic, dated 12/28/2017 MI in 1998 - had angioplasty by Dr. Rockne Menghini. Currently denies any heart issues.  No murmur, sob, cp Stress test 2013 EKG 12/28/2017

## 2018-01-09 NOTE — Pre-Procedure Instructions (Signed)
Collin [name]  01/09/2018      [id number] [address] [name], [address] AT [place] [address] Phone: [phone] Fax: [facility] [place], [place] [address] Phone: [phone] Fax: [phone]    Your procedure is scheduled on Friday, December 27th   Report to [facility] at 12:00 Noon             (posted surgery time 2p - 3:46p)   Call this number if you have problems the morning of surgery:  [phone]   Remember:   Do not eat any foods or drink any liquids after midnight, Thursday.    Take these medicines the morning of surgery with A SIP OF WATER: Metoprolol (Toprol-XL)   7 days prior to surgery STOP taking any Aspirin(unless otherwise instructed by your surgeon), Aleve, Naproxen, Ibuprofen, Motrin, Advil, Goody's, BC's, all herbal medications, fish oil, and all vitamins     Do not wear jewelry  Do not wear lotions, colognes, or deodorant.  Men may shave face and neck.   Do not bring valuables to the hospital.  [facility] is not responsible for any belongings or valuables.  Contacts, dentures or bridgework may not be worn into surgery.  Leave your suitcase in the car.  After surgery it may be brought to your room.  For patients admitted to the hospital, discharge time will be determined by your treatment team.        [facility]- Preparing For Surgery  Before surgery, you can play an important role. Because skin is not sterile, your skin needs to be as free of germs as possible. You can reduce the number of germs on your skin by washing with CHG (chlorahexidine gluconate) Soap before surgery.  CHG is an antiseptic cleaner which kills germs and bonds with the skin to continue killing germs even after washing.    Oral Hygiene is also important to reduce your  risk of infection.    Remember - BRUSH YOUR TEETH THE MORNING OF SURGERY WITH YOUR REGULAR TOOTHPASTE  Please do not use if you have an allergy to CHG or antibacterial soaps. If your skin becomes reddened/irritated stop using the CHG.  Do not shave (including legs and underarms) for at least 48 hours prior to first CHG shower. It is OK to shave your face.  Please follow these instructions carefully.   1. Shower the NIGHT BEFORE SURGERY and the MORNING OF SURGERY with CHG.   2. If you chose to wash your hair, wash your hair first as usual with your normal shampoo.  3. After you shampoo, rinse your hair and body thoroughly to remove the shampoo.  4. Use CHG as you would any other liquid soap. You can apply CHG directly to the skin and wash gently with a scrungie or a clean washcloth.   5. Apply the CHG Soap to your body ONLY FROM THE NECK DOWN.  Do not use on open wounds or open sores. Avoid contact with your eyes, ears, mouth and genitals (private parts). Wash Face and genitals (private parts)  with your normal soap.  6. Wash thoroughly, paying special attention to the area where your surgery will be performed.  7. Thoroughly rinse your body with warm water from the neck down.  8. DO NOT shower/wash with your normal soap after  using and rinsing off the CHG Soap.  9. Pat yourself dry with a CLEAN TOWEL.  10. Wear CLEAN PAJAMAS to bed the night before surgery, wear comfortable clothes the morning of surgery  11. Place CLEAN SHEETS on your bed the night of your first shower and DO NOT SLEEP WITH PETS.  Day of Surgery:  Do not apply any deodorants/lotions.  Please wear clean clothes to the hospital/surgery center.   Remember to brush your teeth WITH YOUR REGULAR TOOTHPASTE.   Please read over the following fact sheets that you were given. Coughing and Deep Breathing, MRSA Information and Surgical Site Infection Prevention

## 2018-01-10 NOTE — Progress Notes (Signed)
Anesthesia Chart Review    Case:  [id number] Date/Time:  01/20/18 1415   Procedure:  Left L5-S1 extraforaminal decompression (Left Back)   Anesthesia type:  General   Pre-op diagnosis:  Foot drop   Location:  [facility] OR ROOM 51 / [facility] OR   Surgeon:  Eustace Moore, MD      DISCUSSION:  77 yo never smoker with h/o HTN, hyperlipidemia, CAD, previous MI, BPH. Remote inferior wall MI in 1998 treated with primary angioplasty of the RCA. Stress test 09/20/11 with small region of mild ischemia anteroseptal and anteroapical regions with a quantitative left ventricular ejection fraction of 56%, subsequent cath on 10/20/11.  Cath demonstrated severe stenosis of the mid LAD, nonobstructive stenosis of the left circumflex and right coronary arteries. Treated medically for LAD stenosis, complete resolution of his exertional anginal symptoms.   Seen by Cardiologist, Sherren Mocha, MD, 12/28/17.  Per his note "The patient is stable without symptoms of angina.  He can achieve a good workload well above 4 metabolic equivalents with no cardiopulmonary symptoms.  He is at low risk of cardiac complications related to planned lumbar surgery.  He should continue his beta-blocker in the perioperative period without interruption. He is ok to hold ASA 5-7 days preoperatively if needed.  I will plan to see him back in 2 years for follow-up at his request.  He understands to contact me if any cardiac symptoms arise prior to that.  His lipids are followed by his primary care physician and he is treated with a statin drug.  LDL cholesterol is 54 mg/dL."   Anticipate pt can proceed as planned barring acute status change.   VS: BP (!) 156/71   Pulse 65   Temp 36.7 C   Resp 20   Ht 6\' 1"  (1.854 m)   Wt 89.2 kg   SpO2 97%   BMI 25.94 kg/m   PROVIDERS: Martinique, Betty G, MD is PCP  Sherren Mocha, MD is cardiologist   LABS: Labs reviewed: Acceptable for surgery. (all labs ordered are listed, but only abnormal results are  displayed)  Labs Reviewed  SURGICAL PCR SCREEN - Abnormal; Notable for the following components:      Result Value   Staphylococcus aureus POSITIVE (*)    All other components within normal limits  BASIC METABOLIC PANEL - Abnormal; Notable for the following components:   Glucose, Bld 123 (*)    GFR calc non Af Amer 58 (*)    All other components within normal limits  CBC WITH DIFFERENTIAL/PLATELET  PROTIME-INR     IMAGES: Chest xray 01/09/18:  FINDINGS: The heart size and mediastinal contours are within normal limits. Both lungs are clear. The visualized skeletal structures are unremarkable.  IMPRESSION: Clear lungs.   EKG 12/28/17:  Sinus rhythm with 1st degree AV block  CV: Nuclear medicine study 09/19/17:  Clinically and electrically negative for ischemia.  Excellent exercise tolerance.  Myoview scan with a small region of  mild ischemia in the apical anterior/anteroseptal wall.    Left Heart Catheterization 10/19/17:  Final Conclusions:   1. Severe single-vessel coronary artery disease involving the mid LAD 2. Nonobstructive disease of the right coronary artery and left circumflex 3. Preserved LV function  Past Medical History:  Diagnosis Date  . Allergy   . Arthritis   . BPH (benign prostatic hyperplasia)   . CAD (coronary artery disease)   . Cataract   . Diverticulosis of colon   . Hx of adenomatous colonic polyps 05/22/2014  .  Hyperlipidemia   . Hypertension   . MYOCARDIAL INFARCTION, HX OF 07/20/2006   Qualifier: Diagnosis of  By: Leanne Chang MD, Bruce      Past Surgical History:  Procedure Laterality Date  . CATARACT EXTRACTION     Bil  . COLONOSCOPY    . EYE SURGERY    . PTCA      MEDICATIONS: . aspirin 81 MG tablet  . Bioflavonoid Products (GRAPE SEED PO)  . Cholecalciferol (VITAMIN D3) 125 MCG (5000 UT) CAPS  . CHOLINE PO  . Coenzyme Q10 (EQL COQ10) 300 MG CAPS  . fish oil-omega-3 fatty acids 1000 MG capsule  . Glucosamine-Chondroitin (MOVE  FREE PO)  . ibuprofen (ADVIL,MOTRIN) 200 MG tablet  . metoprolol succinate (TOPROL-XL) 25 MG 24 hr tablet  . Multiple Vitamin (MULTIVITAMIN) tablet  . nitroGLYCERIN (NITROSTAT) 0.4 MG SL tablet  . pyridOXINE (VITAMIN B-6) 100 MG tablet  . Saw Palmetto 450 MG CAPS  . simvastatin (ZOCOR) 40 MG tablet  . vitamin B-12 (CYANOCOBALAMIN) 1000 MCG tablet   No current facility-administered medications for this encounter.     Maia Plan [facility] Short Stay Center/Anesthesiology  [phone] 01/10/18 2:47 PM

## 2018-01-19 MED ORDER — VANCOMYCIN HCL IN DEXTROSE 1-5 GM/200ML-% IV SOLN
1000.0000 mg | INTRAVENOUS | Status: AC
  Administered 2018-01-20: 1000 mg via INTRAVENOUS
  Filled 2018-01-19: qty 200

## 2018-01-20 ENCOUNTER — Encounter (HOSPITAL_COMMUNITY): Payer: Self-pay | Admitting: *Deleted

## 2018-01-20 ENCOUNTER — Inpatient Hospital Stay (HOSPITAL_COMMUNITY): Payer: PPO

## 2018-01-20 ENCOUNTER — Other Ambulatory Visit: Payer: Self-pay

## 2018-01-20 ENCOUNTER — Inpatient Hospital Stay (HOSPITAL_COMMUNITY): Payer: PPO | Admitting: Certified Registered Nurse Anesthetist

## 2018-01-20 ENCOUNTER — Ambulatory Visit (HOSPITAL_COMMUNITY)
Admission: RE | Admit: 2018-01-20 | Discharge: 2018-01-21 | Disposition: A | Discharge status: Home / Self Care | Payer: PPO | Attending: Neurological Surgery | Admitting: Neurological Surgery

## 2018-01-20 ENCOUNTER — Inpatient Hospital Stay (HOSPITAL_COMMUNITY): Payer: PPO | Admitting: Physician Assistant

## 2018-01-20 ENCOUNTER — Encounter (HOSPITAL_COMMUNITY)
Admission: RE | Disposition: A | Discharge status: Home / Self Care | Payer: Self-pay | Source: Home / Self Care | Attending: Neurological Surgery

## 2018-01-20 DIAGNOSIS — M4807 Spinal stenosis, lumbosacral region: Secondary | ICD-10-CM | POA: Diagnosis not present

## 2018-01-20 DIAGNOSIS — M4727 Other spondylosis with radiculopathy, lumbosacral region: Secondary | ICD-10-CM | POA: Diagnosis not present

## 2018-01-20 DIAGNOSIS — M5117 Intervertebral disc disorders with radiculopathy, lumbosacral region: Principal | ICD-10-CM | POA: Insufficient documentation

## 2018-01-20 DIAGNOSIS — Z9889 Other specified postprocedural states: Secondary | ICD-10-CM

## 2018-01-20 DIAGNOSIS — Z9841 Cataract extraction status, right eye: Secondary | ICD-10-CM | POA: Diagnosis not present

## 2018-01-20 DIAGNOSIS — Z8249 Family history of ischemic heart disease and other diseases of the circulatory system: Secondary | ICD-10-CM | POA: Insufficient documentation

## 2018-01-20 DIAGNOSIS — M9983 Other biomechanical lesions of lumbar region: Secondary | ICD-10-CM | POA: Diagnosis not present

## 2018-01-20 DIAGNOSIS — I10 Essential (primary) hypertension: Secondary | ICD-10-CM | POA: Insufficient documentation

## 2018-01-20 DIAGNOSIS — N400 Enlarged prostate without lower urinary tract symptoms: Secondary | ICD-10-CM | POA: Diagnosis not present

## 2018-01-20 DIAGNOSIS — M1990 Unspecified osteoarthritis, unspecified site: Secondary | ICD-10-CM | POA: Insufficient documentation

## 2018-01-20 DIAGNOSIS — Z841 Family history of disorders of kidney and ureter: Secondary | ICD-10-CM | POA: Insufficient documentation

## 2018-01-20 DIAGNOSIS — M21372 Foot drop, left foot: Secondary | ICD-10-CM | POA: Insufficient documentation

## 2018-01-20 DIAGNOSIS — I252 Old myocardial infarction: Secondary | ICD-10-CM | POA: Diagnosis not present

## 2018-01-20 DIAGNOSIS — K5730 Diverticulosis of large intestine without perforation or abscess without bleeding: Secondary | ICD-10-CM | POA: Diagnosis not present

## 2018-01-20 DIAGNOSIS — Z823 Family history of stroke: Secondary | ICD-10-CM | POA: Insufficient documentation

## 2018-01-20 DIAGNOSIS — Z981 Arthrodesis status: Secondary | ICD-10-CM | POA: Diagnosis not present

## 2018-01-20 DIAGNOSIS — I2510 Atherosclerotic heart disease of native coronary artery without angina pectoris: Secondary | ICD-10-CM | POA: Insufficient documentation

## 2018-01-20 DIAGNOSIS — E785 Hyperlipidemia, unspecified: Secondary | ICD-10-CM | POA: Insufficient documentation

## 2018-01-20 DIAGNOSIS — M5116 Intervertebral disc disorders with radiculopathy, lumbar region: Secondary | ICD-10-CM | POA: Diagnosis not present

## 2018-01-20 DIAGNOSIS — M9984 Other biomechanical lesions of sacral region: Secondary | ICD-10-CM | POA: Diagnosis not present

## 2018-01-20 DIAGNOSIS — M48061 Spinal stenosis, lumbar region without neurogenic claudication: Secondary | ICD-10-CM | POA: Diagnosis not present

## 2018-01-20 DIAGNOSIS — Z791 Long term (current) use of non-steroidal anti-inflammatories (NSAID): Secondary | ICD-10-CM | POA: Insufficient documentation

## 2018-01-20 DIAGNOSIS — Z419 Encounter for procedure for purposes other than remedying health state, unspecified: Secondary | ICD-10-CM

## 2018-01-20 DIAGNOSIS — Z833 Family history of diabetes mellitus: Secondary | ICD-10-CM | POA: Diagnosis not present

## 2018-01-20 DIAGNOSIS — Z9842 Cataract extraction status, left eye: Secondary | ICD-10-CM | POA: Diagnosis not present

## 2018-01-20 DIAGNOSIS — Z86010 Personal history of colonic polyps: Secondary | ICD-10-CM | POA: Insufficient documentation

## 2018-01-20 DIAGNOSIS — Z79899 Other long term (current) drug therapy: Secondary | ICD-10-CM | POA: Diagnosis not present

## 2018-01-20 DIAGNOSIS — Z7982 Long term (current) use of aspirin: Secondary | ICD-10-CM | POA: Insufficient documentation

## 2018-01-20 HISTORY — PX: LUMBAR LAMINECTOMY/DECOMPRESSION MICRODISCECTOMY: SHX5026

## 2018-01-20 SURGERY — LUMBAR LAMINECTOMY/DECOMPRESSION MICRODISCECTOMY 1 LEVEL
Anesthesia: General | Site: Back | Laterality: Left

## 2018-01-20 MED ORDER — ROCURONIUM BROMIDE 50 MG/5ML IV SOSY
PREFILLED_SYRINGE | INTRAVENOUS | Status: AC
  Filled 2018-01-20: qty 5

## 2018-01-20 MED ORDER — SODIUM CHLORIDE 0.9 % IV SOLN: 250.0000 mL | INTRAVENOUS | Status: DC

## 2018-01-20 MED ORDER — LIDOCAINE 2% (20 MG/ML) 5 ML SYRINGE
INTRAMUSCULAR | Status: DC | PRN
  Administered 2018-01-20: 100 mg via INTRAVENOUS

## 2018-01-20 MED ORDER — FENTANYL CITRATE (PF) 100 MCG/2ML IJ SOLN
INTRAMUSCULAR | Status: AC
  Filled 2018-01-20: qty 2

## 2018-01-20 MED ORDER — CHLORHEXIDINE GLUCONATE CLOTH 2 % EX PADS: 6.0000 | MEDICATED_PAD | Freq: Once | CUTANEOUS | Status: DC

## 2018-01-20 MED ORDER — SODIUM CHLORIDE 0.9 % IV SOLN
INTRAVENOUS | Status: DC | PRN
  Administered 2018-01-20: 30 ug/min via INTRAVENOUS

## 2018-01-20 MED ORDER — POTASSIUM CHLORIDE IN NACL 20-0.9 MEQ/L-% IV SOLN: INTRAVENOUS | Status: DC

## 2018-01-20 MED ORDER — DEXAMETHASONE SODIUM PHOSPHATE 10 MG/ML IJ SOLN
INTRAMUSCULAR | Status: AC
  Filled 2018-01-20: qty 1

## 2018-01-20 MED ORDER — PROPOFOL 10 MG/ML IV BOLUS
INTRAVENOUS | Status: AC
  Filled 2018-01-20: qty 20

## 2018-01-20 MED ORDER — ESMOLOL HCL 100 MG/10ML IV SOLN
INTRAVENOUS | Status: AC
  Filled 2018-01-20: qty 10

## 2018-01-20 MED ORDER — ACETAMINOPHEN 10 MG/ML IV SOLN
INTRAVENOUS | Status: AC
  Filled 2018-01-20: qty 100

## 2018-01-20 MED ORDER — BUPIVACAINE HCL (PF) 0.25 % IJ SOLN
INTRAMUSCULAR | Status: AC
  Filled 2018-01-20: qty 30

## 2018-01-20 MED ORDER — ONDANSETRON HCL 4 MG/2ML IJ SOLN: 4.0000 mg | Freq: Four times a day (QID) | INTRAMUSCULAR | Status: DC | PRN

## 2018-01-20 MED ORDER — THROMBIN 5000 UNITS EX SOLR
CUTANEOUS | Status: AC
  Filled 2018-01-20: qty 10000

## 2018-01-20 MED ORDER — LACTATED RINGERS IV SOLN
INTRAVENOUS | Status: DC | PRN
  Administered 2018-01-20 (×2): via INTRAVENOUS

## 2018-01-20 MED ORDER — ONDANSETRON HCL 4 MG/2ML IJ SOLN
INTRAMUSCULAR | Status: DC | PRN
  Administered 2018-01-20: 4 mg via INTRAVENOUS

## 2018-01-20 MED ORDER — MEPERIDINE HCL 50 MG/ML IJ SOLN: 6.2500 mg | INTRAMUSCULAR | Status: DC | PRN

## 2018-01-20 MED ORDER — FENTANYL CITRATE (PF) 100 MCG/2ML IJ SOLN
INTRAMUSCULAR | Status: DC | PRN
  Administered 2018-01-20: 100 ug via INTRAVENOUS

## 2018-01-20 MED ORDER — HEMOSTATIC AGENTS (NO CHARGE) OPTIME
TOPICAL | Status: DC | PRN
  Administered 2018-01-20: 1 via TOPICAL

## 2018-01-20 MED ORDER — HYDROCODONE-ACETAMINOPHEN 7.5-325 MG PO TABS
1.0000 | ORAL_TABLET | Freq: Four times a day (QID) | ORAL | Status: DC
  Administered 2018-01-20 – 2018-01-21 (×3): 1 via ORAL
  Filled 2018-01-20 (×3): qty 1

## 2018-01-20 MED ORDER — DEXAMETHASONE SODIUM PHOSPHATE 10 MG/ML IJ SOLN
INTRAMUSCULAR | Status: DC | PRN
  Administered 2018-01-20: 10 mg via INTRAVENOUS

## 2018-01-20 MED ORDER — FENTANYL CITRATE (PF) 250 MCG/5ML IJ SOLN
INTRAMUSCULAR | Status: AC
  Filled 2018-01-20: qty 5

## 2018-01-20 MED ORDER — SODIUM CHLORIDE 0.9 % IV SOLN
INTRAVENOUS | Status: DC | PRN
  Administered 2018-01-20: 15:00:00

## 2018-01-20 MED ORDER — ONDANSETRON HCL 4 MG PO TABS: 4.0000 mg | ORAL_TABLET | Freq: Four times a day (QID) | ORAL | Status: DC | PRN

## 2018-01-20 MED ORDER — METHOCARBAMOL 1000 MG/10ML IJ SOLN
500.0000 mg | Freq: Four times a day (QID) | INTRAVENOUS | Status: DC | PRN
  Filled 2018-01-20: qty 5

## 2018-01-20 MED ORDER — THROMBIN 5000 UNITS EX SOLR
CUTANEOUS | Status: DC | PRN
  Administered 2018-01-20 (×2): 5000 [IU] via TOPICAL

## 2018-01-20 MED ORDER — PHENOL 1.4 % MT LIQD: 1.0000 | OROMUCOSAL | Status: DC | PRN

## 2018-01-20 MED ORDER — ACETAMINOPHEN 10 MG/ML IV SOLN
INTRAVENOUS | Status: DC | PRN
  Administered 2018-01-20: 1000 mg via INTRAVENOUS

## 2018-01-20 MED ORDER — LIDOCAINE 2% (20 MG/ML) 5 ML SYRINGE
INTRAMUSCULAR | Status: AC
  Filled 2018-01-20: qty 5

## 2018-01-20 MED ORDER — BUPIVACAINE HCL (PF) 0.25 % IJ SOLN
INTRAMUSCULAR | Status: DC | PRN
  Administered 2018-01-20: 2 mL
  Administered 2018-01-20: 8 mL

## 2018-01-20 MED ORDER — SODIUM CHLORIDE 0.9% FLUSH: 3.0000 mL | INTRAVENOUS | Status: DC | PRN

## 2018-01-20 MED ORDER — MENTHOL 3 MG MT LOZG: 1.0000 | LOZENGE | OROMUCOSAL | Status: DC | PRN

## 2018-01-20 MED ORDER — LACTATED RINGERS IV SOLN
INTRAVENOUS | Status: DC
  Administered 2018-01-20: 12:00:00 via INTRAVENOUS

## 2018-01-20 MED ORDER — HYDROCODONE-ACETAMINOPHEN 7.5-325 MG PO TABS: 1.0000 | ORAL_TABLET | Freq: Once | ORAL | Status: DC | PRN

## 2018-01-20 MED ORDER — CELECOXIB 200 MG PO CAPS
200.0000 mg | ORAL_CAPSULE | Freq: Two times a day (BID) | ORAL | Status: DC
  Administered 2018-01-20: 200 mg via ORAL
  Filled 2018-01-20: qty 1

## 2018-01-20 MED ORDER — HYDROMORPHONE HCL 1 MG/ML IJ SOLN: 0.2500 mg | INTRAMUSCULAR | Status: DC | PRN

## 2018-01-20 MED ORDER — ACETAMINOPHEN 500 MG PO TABS: 1000.0000 mg | ORAL_TABLET | Freq: Once | ORAL | Status: DC

## 2018-01-20 MED ORDER — METHOCARBAMOL 500 MG PO TABS: 500.0000 mg | ORAL_TABLET | Freq: Four times a day (QID) | ORAL | Status: DC | PRN

## 2018-01-20 MED ORDER — METHYLPREDNISOLONE ACETATE 80 MG/ML IJ SUSP
INTRAMUSCULAR | Status: DC | PRN
  Administered 2018-01-20: 80 mg

## 2018-01-20 MED ORDER — ASPIRIN EC 81 MG PO TBEC
81.0000 mg | DELAYED_RELEASE_TABLET | Freq: Every day | ORAL | Status: DC
  Administered 2018-01-20: 81 mg via ORAL
  Filled 2018-01-20: qty 1

## 2018-01-20 MED ORDER — PROPOFOL 10 MG/ML IV BOLUS
INTRAVENOUS | Status: DC | PRN
  Administered 2018-01-20: 150 mg via INTRAVENOUS
  Administered 2018-01-20: 50 mg via INTRAVENOUS

## 2018-01-20 MED ORDER — ONDANSETRON HCL 4 MG/2ML IJ SOLN
INTRAMUSCULAR | Status: AC
  Filled 2018-01-20: qty 2

## 2018-01-20 MED ORDER — DEXAMETHASONE SODIUM PHOSPHATE 10 MG/ML IJ SOLN: 10.0000 mg | INTRAMUSCULAR | Status: DC

## 2018-01-20 MED ORDER — EPHEDRINE 5 MG/ML INJ
INTRAVENOUS | Status: AC
  Filled 2018-01-20: qty 10

## 2018-01-20 MED ORDER — FENTANYL CITRATE (PF) 100 MCG/2ML IJ SOLN
INTRAMUSCULAR | Status: DC | PRN
  Administered 2018-01-20: 25 ug via INTRAVENOUS
  Administered 2018-01-20 (×2): 50 ug via INTRAVENOUS
  Administered 2018-01-20: 150 ug via INTRAVENOUS

## 2018-01-20 MED ORDER — THROMBIN 5000 UNITS EX SOLR
OROMUCOSAL | Status: DC | PRN
  Administered 2018-01-20: 15:00:00 via TOPICAL

## 2018-01-20 MED ORDER — DEXAMETHASONE SODIUM PHOSPHATE 4 MG/ML IJ SOLN: 4.0000 mg | Freq: Four times a day (QID) | INTRAMUSCULAR | Status: DC

## 2018-01-20 MED ORDER — THROMBIN 5000 UNITS EX SOLR
CUTANEOUS | Status: AC
  Filled 2018-01-20: qty 5000

## 2018-01-20 MED ORDER — ACETAMINOPHEN 650 MG RE SUPP: 650.0000 mg | RECTAL | Status: DC | PRN

## 2018-01-20 MED ORDER — PROMETHAZINE HCL 25 MG/ML IJ SOLN: 12.5000 mg | Freq: Once | INTRAMUSCULAR | Status: DC | PRN

## 2018-01-20 MED ORDER — SODIUM CHLORIDE 0.9% FLUSH: 3.0000 mL | Freq: Two times a day (BID) | INTRAVENOUS | Status: DC

## 2018-01-20 MED ORDER — DEXAMETHASONE 4 MG PO TABS
4.0000 mg | ORAL_TABLET | Freq: Four times a day (QID) | ORAL | Status: DC
  Administered 2018-01-20 – 2018-01-21 (×3): 4 mg via ORAL
  Filled 2018-01-20 (×3): qty 1

## 2018-01-20 MED ORDER — SENNA 8.6 MG PO TABS
1.0000 | ORAL_TABLET | Freq: Two times a day (BID) | ORAL | Status: DC
  Administered 2018-01-20: 8.6 mg via ORAL
  Filled 2018-01-20: qty 1

## 2018-01-20 MED ORDER — METHYLPREDNISOLONE ACETATE 80 MG/ML IJ SUSP
INTRAMUSCULAR | Status: AC
  Filled 2018-01-20: qty 1

## 2018-01-20 MED ORDER — ROCURONIUM BROMIDE 10 MG/ML (PF) SYRINGE
PREFILLED_SYRINGE | INTRAVENOUS | Status: DC | PRN
  Administered 2018-01-20 (×2): 10 mg via INTRAVENOUS
  Administered 2018-01-20: 50 mg via INTRAVENOUS
  Administered 2018-01-20: 20 mg via INTRAVENOUS
  Administered 2018-01-20: 10 mg via INTRAVENOUS

## 2018-01-20 MED ORDER — VANCOMYCIN HCL IN DEXTROSE 1-5 GM/200ML-% IV SOLN
1000.0000 mg | Freq: Once | INTRAVENOUS | Status: AC
  Administered 2018-01-20: 1000 mg via INTRAVENOUS
  Filled 2018-01-20: qty 200

## 2018-01-20 MED ORDER — METOPROLOL SUCCINATE ER 25 MG PO TB24: 25.0000 mg | ORAL_TABLET | Freq: Every day | ORAL | Status: DC

## 2018-01-20 MED ORDER — PHENYLEPHRINE 40 MCG/ML (10ML) SYRINGE FOR IV PUSH (FOR BLOOD PRESSURE SUPPORT)
PREFILLED_SYRINGE | INTRAVENOUS | Status: DC | PRN
  Administered 2018-01-20: 80 ug via INTRAVENOUS

## 2018-01-20 MED ORDER — ACETAMINOPHEN 325 MG PO TABS: 650.0000 mg | ORAL_TABLET | ORAL | Status: DC | PRN

## 2018-01-20 MED ORDER — EPHEDRINE SULFATE 50 MG/ML IJ SOLN
INTRAMUSCULAR | Status: DC | PRN
  Administered 2018-01-20 (×2): 5 mg via INTRAVENOUS

## 2018-01-20 MED ORDER — ACETAMINOPHEN 10 MG/ML IV SOLN: 1000.0000 mg | Freq: Once | INTRAVENOUS | Status: DC | PRN

## 2018-01-20 MED ORDER — 0.9 % SODIUM CHLORIDE (POUR BTL) OPTIME
TOPICAL | Status: DC | PRN
  Administered 2018-01-20: 1000 mL

## 2018-01-20 MED ORDER — GABAPENTIN 300 MG PO CAPS: 300.0000 mg | ORAL_CAPSULE | Freq: Once | ORAL | Status: DC

## 2018-01-20 MED ORDER — MORPHINE SULFATE (PF) 2 MG/ML IV SOLN
2.0000 mg | INTRAVENOUS | Status: DC | PRN
  Administered 2018-01-20: 2 mg via INTRAVENOUS
  Filled 2018-01-20: qty 1

## 2018-01-20 MED ORDER — ESMOLOL HCL 100 MG/10ML IV SOLN
INTRAVENOUS | Status: DC | PRN
  Administered 2018-01-20: 40 mg via INTRAVENOUS

## 2018-01-20 SURGICAL SUPPLY — 65 items
ADH SKN CLS APL DERMABOND .7 (GAUZE/BANDAGES/DRESSINGS) ×1
APL SKNCLS STERI-STRIP NONHPOA (GAUZE/BANDAGES/DRESSINGS) ×1
BAG DECANTER FOR FLEXI CONT (MISCELLANEOUS) ×3 IMPLANT
BENZOIN TINCTURE PRP APPL 2/3 (GAUZE/BANDAGES/DRESSINGS) ×3 IMPLANT
BUR MATCHSTICK NEURO 3.0 LAGG (BURR) ×3 IMPLANT
CANISTER SUCT 3000ML PPV (MISCELLANEOUS) ×3 IMPLANT
CARTRIDGE OIL MAESTRO DRILL (MISCELLANEOUS) ×1 IMPLANT
CLOSURE WOUND 1/2 X4 (GAUZE/BANDAGES/DRESSINGS) ×1
COVER WAND RF STERILE (DRAPES) ×1 IMPLANT
DERMABOND ADVANCED (GAUZE/BANDAGES/DRESSINGS) ×2
DERMABOND ADVANCED .7 DNX12 (GAUZE/BANDAGES/DRESSINGS) IMPLANT
DIFFUSER DRILL AIR PNEUMATIC (MISCELLANEOUS) ×3 IMPLANT
DRAPE C-ARM 42X72 X-RAY (DRAPES) ×4 IMPLANT
DRAPE LAPAROTOMY 100X72X124 (DRAPES) ×3 IMPLANT
DRAPE MICROSCOPE LEICA (MISCELLANEOUS) ×3 IMPLANT
DRAPE POUCH INSTRU U-SHP 10X18 (DRAPES) ×1 IMPLANT
DRAPE SURG 17X23 STRL (DRAPES) ×3 IMPLANT
DRSG OPSITE POSTOP 3X4 (GAUZE/BANDAGES/DRESSINGS) ×2 IMPLANT
DURAPREP 26ML APPLICATOR (WOUND CARE) ×3 IMPLANT
ELECT REM PT RETURN 9FT ADLT (ELECTROSURGICAL) ×3
ELECTRODE REM PT RTRN 9FT ADLT (ELECTROSURGICAL) ×1 IMPLANT
GAUZE 4X4 16PLY RFD (DISPOSABLE) IMPLANT
GLOVE BIO SURGEON STRL SZ 6.5 (GLOVE) ×1 IMPLANT
GLOVE BIO SURGEON STRL SZ7 (GLOVE) ×2 IMPLANT
GLOVE BIO SURGEON STRL SZ8 (GLOVE) ×3 IMPLANT
GLOVE BIO SURGEONS STRL SZ 6.5 (GLOVE) ×1
GLOVE BIOGEL PI IND STRL 6.5 (GLOVE) IMPLANT
GLOVE BIOGEL PI IND STRL 7.0 (GLOVE) IMPLANT
GLOVE BIOGEL PI IND STRL 7.5 (GLOVE) IMPLANT
GLOVE BIOGEL PI IND STRL 8 (GLOVE) IMPLANT
GLOVE BIOGEL PI INDICATOR 6.5 (GLOVE) ×6
GLOVE BIOGEL PI INDICATOR 7.0 (GLOVE) ×2
GLOVE BIOGEL PI INDICATOR 7.5 (GLOVE) ×2
GLOVE BIOGEL PI INDICATOR 8 (GLOVE) ×2
GLOVE ECLIPSE 7.5 STRL STRAW (GLOVE) ×2 IMPLANT
GLOVE SURG SS PI 6.0 STRL IVOR (GLOVE) ×8 IMPLANT
GOWN STRL REUS W/ TWL LRG LVL3 (GOWN DISPOSABLE) IMPLANT
GOWN STRL REUS W/ TWL XL LVL3 (GOWN DISPOSABLE) ×1 IMPLANT
GOWN STRL REUS W/TWL 2XL LVL3 (GOWN DISPOSABLE) IMPLANT
GOWN STRL REUS W/TWL LRG LVL3 (GOWN DISPOSABLE) ×12
GOWN STRL REUS W/TWL XL LVL3 (GOWN DISPOSABLE) ×6
HEMOSTAT POWDER KIT SURGIFOAM (HEMOSTASIS) ×2 IMPLANT
KIT BASIN OR (CUSTOM PROCEDURE TRAY) ×3 IMPLANT
KIT TURNOVER KIT B (KITS) ×3 IMPLANT
NDL HYPO 18GX1.5 BLUNT FILL (NEEDLE) IMPLANT
NDL HYPO 25X1 1.5 SAFETY (NEEDLE) ×1 IMPLANT
NDL SPNL 20GX3.5 QUINCKE YW (NEEDLE) IMPLANT
NEEDLE HYPO 18GX1.5 BLUNT FILL (NEEDLE) ×3 IMPLANT
NEEDLE HYPO 25X1 1.5 SAFETY (NEEDLE) ×3 IMPLANT
NEEDLE SPNL 20GX3.5 QUINCKE YW (NEEDLE) ×3 IMPLANT
NS IRRIG 1000ML POUR BTL (IV SOLUTION) ×3 IMPLANT
OIL CARTRIDGE MAESTRO DRILL (MISCELLANEOUS) ×3
PACK LAMINECTOMY NEURO (CUSTOM PROCEDURE TRAY) ×3 IMPLANT
PAD ARMBOARD 7.5X6 YLW CONV (MISCELLANEOUS) ×9 IMPLANT
RUBBERBAND STERILE (MISCELLANEOUS) ×6 IMPLANT
SPONGE SURGIFOAM ABS GEL SZ50 (HEMOSTASIS) ×2 IMPLANT
STRIP CLOSURE SKIN 1/2X4 (GAUZE/BANDAGES/DRESSINGS) ×2 IMPLANT
SUT VIC AB 0 CT1 18XCR BRD8 (SUTURE) ×1 IMPLANT
SUT VIC AB 0 CT1 8-18 (SUTURE) ×3
SUT VIC AB 2-0 CP2 18 (SUTURE) ×3 IMPLANT
SUT VIC AB 3-0 SH 8-18 (SUTURE) ×3 IMPLANT
SYR 5ML LUER SLIP (SYRINGE) ×2 IMPLANT
TOWEL GREEN STERILE (TOWEL DISPOSABLE) ×3 IMPLANT
TOWEL GREEN STERILE FF (TOWEL DISPOSABLE) ×3 IMPLANT
WATER STERILE IRR 1000ML POUR (IV SOLUTION) ×3 IMPLANT

## 2018-01-20 NOTE — Transfer of Care (Signed)
Immediate Anesthesia Transfer of Care Note  Patient: Collin [name]  Procedure(s) Performed: Left Lumbar five Sacral one extraforaminal decompression (Left Back)  Patient Location: PACU  Anesthesia Type:General  Level of Consciousness: awake, alert  and oriented  Airway & Oxygen Therapy: Patient Spontanous Breathing on room air.  Post-op Assessment: Report given to RN and Post -op Vital signs reviewed and stable  Post vital signs: Reviewed and stable  Last Vitals:  Vitals Value Taken Time  BP 175/84 01/20/2018  6:06 PM  Temp    Pulse 66 01/20/2018  6:07 PM  Resp 12 01/20/2018  6:07 PM  SpO2 99 % 01/20/2018  6:07 PM  Vitals shown include unvalidated device data.  Last Pain:  Vitals:   01/20/18 1203  TempSrc: Oral         Complications: No apparent anesthesia complications

## 2018-01-20 NOTE — Anesthesia Preprocedure Evaluation (Signed)
Anesthesia Evaluation  Patient identified by MRN, date of birth, ID band Patient awake    Reviewed: Allergy & Precautions, NPO status , Patient's Chart, lab work & pertinent test results  Airway Mallampati: I  TM Distance: >3 FB Neck ROM: Full    Dental no notable dental hx. (+) Teeth Intact, Dental Advisory Given   Pulmonary neg pulmonary ROS,    Pulmonary exam normal breath sounds clear to auscultation       Cardiovascular hypertension, Pt. on medications and Pt. on home beta blockers + CAD and + Past MI  Normal cardiovascular exam Rhythm:Regular Rate:Normal     Neuro/Psych negative neurological ROS  negative psych ROS   GI/Hepatic negative GI ROS, Neg liver ROS,   Endo/Other  negative endocrine ROS  Renal/GU negative Renal ROS     Musculoskeletal  (+) Arthritis ,   Abdominal   Peds  Hematology negative hematology ROS (+)   Anesthesia Other Findings   Reproductive/Obstetrics                             Anesthesia Physical Anesthesia Plan  ASA: III  Anesthesia Plan: General   Post-op Pain Management:    Induction: Intravenous  PONV Risk Score and Plan: 2 and Treatment may vary due to age or medical condition, Ondansetron and Dexamethasone  Airway Management Planned: Oral ETT  Additional Equipment:   Intra-op Plan:   Post-operative Plan: Extubation in OR  Informed Consent: I have reviewed the patients History and Physical, chart, labs and discussed the procedure including the risks, benefits and alternatives for the proposed anesthesia with the patient or authorized representative who has indicated his/her understanding and acceptance.   Dental advisory given  Plan Discussed with:   Anesthesia Plan Comments:         Anesthesia Quick Evaluation

## 2018-01-20 NOTE — Plan of Care (Signed)
  Problem: Education: Goal: Ability to verbalize activity precautions or restrictions will improve Outcome: Progressing Goal: Knowledge of the prescribed therapeutic regimen will improve Outcome: Progressing Goal: Understanding of discharge needs will improve Outcome: Progressing   Problem: Activity: Goal: Ability to avoid complications of mobility impairment will improve Outcome: Progressing Goal: Ability to tolerate increased activity will improve Outcome: Progressing Goal: Will remain free from falls Outcome: Progressing   Problem: Pain Management: Goal: Pain level will decrease Outcome: Progressing   Problem: Health Behavior/Discharge Planning: Goal: Identification of resources available to assist in meeting health care needs will improve Outcome: Progressing   Problem: Bladder/Genitourinary: Goal: Urinary functional status for postoperative course will improve Outcome: Progressing

## 2018-01-20 NOTE — H&P (Signed)
Subjective: Patient is a 77 y.o. male admitted for L leg ap[in with chronic foot drop. Onset of symptoms was several months ago, gradually worsening since that time.  The pain is rated moderate, and is located at the across the lower back and radiates to LLE. The pain is described as aching and occurs all day. The symptoms have been progressive. Symptoms are exacerbated by exercise. MRI or CT showed L5-S1 foraminal stenosis  Past Medical History:  Diagnosis Date  . Allergy   . Arthritis   . BPH (benign prostatic hyperplasia)   . CAD (coronary artery disease)   . Cataract   . Diverticulosis of colon   . Hx of adenomatous colonic polyps 05/22/2014  . Hyperlipidemia   . Hypertension   . MYOCARDIAL INFARCTION, HX OF 07/20/2006   Qualifier: Diagnosis of  By: Leanne Chang MD, Bruce      Past Surgical History:  Procedure Laterality Date  . CATARACT EXTRACTION     Bil  . COLONOSCOPY    . EYE SURGERY    . PTCA      Prior to Admission medications   Medication Sig Start Date End Date Taking? Authorizing Provider  aspirin 81 MG tablet Take 81 mg by mouth at bedtime.    Yes [provider]  Bioflavonoid Products (GRAPE SEED PO) Take 1 capsule by mouth daily.   Yes [provider]  Cholecalciferol (VITAMIN D3) 125 MCG (5000 UT) CAPS Take 5,000 Units by mouth at bedtime.   Yes [provider]  CHOLINE PO Take 250 mg by mouth at bedtime.   Yes [provider]  Coenzyme Q10 (EQL COQ10) 300 MG CAPS Take 300 mg by mouth daily.   Yes [provider]  fish oil-omega-3 fatty acids 1000 MG capsule Take 1 g by mouth daily.    Yes [provider]  Glucosamine-Chondroitin (MOVE FREE PO) Take 1 tablet by mouth daily.     Yes [provider]  ibuprofen (ADVIL,MOTRIN) 200 MG tablet Take 400 mg by mouth daily as needed for headache or moderate pain.   Yes [provider]  metoprolol succinate (TOPROL-XL) 25 MG 24 hr tablet Take 1 tablet (25 mg  total) by mouth daily. 12/28/17  Yes Sherren Mocha, MD  Multiple Vitamin (MULTIVITAMIN) tablet Take 1 tablet by mouth daily.     Yes [provider]  nitroGLYCERIN (NITROSTAT) 0.4 MG SL tablet Place 1 tablet (0.4 mg total) under the tongue every 5 (five) minutes as needed for chest pain. 12/28/17  Yes Sherren Mocha, MD  pyridOXINE (VITAMIN B-6) 100 MG tablet Take 100 mg by mouth daily.   Yes [provider]  Saw Palmetto 450 MG CAPS Take 450 mg by mouth at bedtime.   Yes [provider]  simvastatin (ZOCOR) 40 MG tablet TAKE 1 TABLET BY MOUTH ONCE DAILY AT 6PM 12/28/17  Yes Sherren Mocha, MD  vitamin B-12 (CYANOCOBALAMIN) 1000 MCG tablet Take 1,000 mcg by mouth daily.   Yes [provider]   Allergies  Allergen Reactions  . Penicillins Rash and Other (See Comments)    PATIENT HAS HAD A PCN REACTION WITH IMMEDIATE RASH, FACIAL/TONGUE/THROAT SWELLING, SOB, OR LIGHTHEADEDNESS WITH HYPOTENSION:  #  #  YES  #  #  Has patient had a PCN reaction causing severe rash involving mucus membranes or skin necrosis: No Has patient had a PCN reaction that required hospitalization: No Has patient had a PCN reaction occurring within the last 10 years: No If all of  the above answers are "NO", then may proceed with Cephalosporin use.    Social History   Tobacco Use  . Smoking status: Never Smoker  . Smokeless tobacco: Never Used  Substance Use Topics  . Alcohol use: Yes    Alcohol/week: 1.0 standard drinks    Types: 1 Glasses of wine per week    Comment: Occasional beer.    Family History  Problem Relation Age of Onset  . Heart disease Mother        CABG  . Stroke Father   . Kidney disease Father        renal failure  . AAA (abdominal aortic aneurysm) Father   . Hypertension Sister   . Diabetes Brother   . AAA (abdominal aortic aneurysm) Paternal Uncle   . Colon cancer Neg Hx      Review of Systems  Positive ROS: neg  All other systems have been  reviewed and were otherwise negative with the exception of those mentioned in the HPI and as above.  Objective: Vital signs in last 24 hours: Temp:  [97.7 F (36.5 C)] 97.7 F (36.5 C) (12/27 1203) Pulse Rate:  [54] 54 (12/27 1203) Resp:  [20] 20 (12/27 1203) BP: (177)/(73) 177/73 (12/27 1203) SpO2:  [97 %] 97 % (12/27 1203) Weight:  [86.6 kg] 86.6 kg (12/27 1203)  General Appearance: Alert, cooperative, no distress, appears stated age Head: Normocephalic, without obvious abnormality, atraumatic Eyes: PERRL, conjunctiva/corneas clear, EOM's intact    Neck: Supple, symmetrical, trachea midline Back: Symmetric, no curvature, ROM normal, no CVA tenderness Lungs:  respirations unlabored Heart: Regular rate and rhythm Abdomen: Soft, non-tender Extremities: Extremities normal, atraumatic, no cyanosis or edema Pulses: 2+ and symmetric all extremities Skin: Skin color, texture, turgor normal, no rashes or lesions  NEUROLOGIC:   Mental status: Alert and oriented x4,  no aphasia, good attention span, fund of knowledge, and memory Motor Exam - grossly normal with mild footdrop L Sensory Exam - grossly normal Reflexes: 1+ Coordination - grossly normal Gait - grossly normal Balance - grossly normal Cranial Nerves: I: smell Not tested  II: visual acuity  OS: nl    OD: nl  II: visual fields Full to confrontation  II: pupils Equal, round, reactive to light  III,VII: ptosis None  III,IV,VI: extraocular muscles  Full ROM  V: mastication Normal  V: facial light touch sensation  Normal  V,VII: corneal reflex  Present  VII: facial muscle function - upper  Normal  VII: facial muscle function - lower Normal  VIII: hearing Not tested  IX: soft palate elevation  Normal  IX,X: gag reflex Present  XI: trapezius strength  5/5  XI: sternocleidomastoid strength 5/5  XI: neck flexion strength  5/5  XII: tongue strength  Normal    Data Review Lab Results  Component Value Date   WBC 7.9  01/09/2018   HGB 15.4 01/09/2018   HCT 48.1 01/09/2018   MCV 89.6 01/09/2018   PLT 197 01/09/2018   Lab Results  Component Value Date   NA 138 01/09/2018   K 4.3 01/09/2018   CL 104 01/09/2018   CO2 24 01/09/2018   BUN 15 01/09/2018   CREATININE 1.18 01/09/2018   GLUCOSE 123 (H) 01/09/2018   Lab Results  Component Value Date   INR 1.03 01/09/2018    Assessment/Plan:  Estimated body mass index is 25.2 kg/m as calculated from the following:   Height as of this encounter: 6\' 1"  (1.854 m).   Weight as  of this encounter: 86.6 kg. Patient admitted for L L5-S1 extraforaminal diskecrtomy. Patient has failed a reasonable attempt at conservative therapy.  I explained the condition and procedure to the patient and answered any questions.  Patient wishes to proceed with procedure as planned. Understands risks/ benefits and typical outcomes of procedure.   Eustace Moore 01/20/2018 3:17 PM

## 2018-01-20 NOTE — Anesthesia Procedure Notes (Signed)
Procedure Name: Intubation Performed by: Milford Cage, CRNA Pre-anesthesia Checklist: Patient identified, Emergency Drugs available, Suction available and Patient being monitored Patient Re-evaluated:Patient Re-evaluated prior to induction Oxygen Delivery Method: Circle System Utilized Preoxygenation: Pre-oxygenation with 100% oxygen Induction Type: IV induction Ventilation: Mask ventilation without difficulty Laryngoscope Size: Mac and 4 Grade View: Grade I Tube type: Oral Tube size: 7.5 mm Number of attempts: 1 Airway Equipment and Method: Stylet Placement Confirmation: ETT inserted through vocal cords under direct vision,  positive ETCO2 and breath sounds checked- equal and bilateral Secured at: 23 cm Tube secured with: Tape Dental Injury: Teeth and Oropharynx as per pre-operative assessment

## 2018-01-20 NOTE — Op Note (Signed)
01/20/2018  5:59 PM  PATIENT:  Collin [name]  77 y.o. male  PRE-OPERATIVE DIAGNOSIS: Left L5-S1 extraforaminal disc herniation and severe spondylosis and severe foraminal stenosis with left L5 radiculopathy and partial foot drop  POST-OPERATIVE DIAGNOSIS:  same  PROCEDURE: Left L5-S1 extraforaminal decompression and microdiscectomy utilizing microscopic dissection  SURGEON:  Sherley Bounds, MD  ASSISTANTS: Dr. Sherwood Gambler  ANESTHESIA:   General  EBL: 200 ml  Total I/O In: 950 [I.V.:950] Out: 200 [Blood:200]  BLOOD ADMINISTERED: none  DRAINS: none  SPECIMEN:  none  INDICATION FOR PROCEDURE: This patient presented with left leg pain and a partial foot drop which was chronic. Imaging showed severe foraminal stenosis L5-S1 on the left with severe spondylosis and facet arthrosis. The patient tried conservative measures without relief. Pain was debilitating. Recommended L5-S1 extraforaminal decompression and possible discectomy. Patient understood the risks, benefits, and alternatives and potential outcomes and wished to proceed.  PROCEDURE DETAILS: The patient was taken to the operating room and after induction of adequate generalized endotracheal anesthesia, the patient was rolled into the prone position on the Wilson frame and all pressure points were padded. The lumbar region was cleaned and then prepped with DuraPrep and draped in the usual sterile fashion. 5 cc of local anesthesia was injected and then a dorsal midline incision was made and carried down to the lumbo sacral fascia. The fascia was opened and the paraspinous musculature was taken down in a subperiosteal fashion to expose the left L5-S1 extraforaminal space. Intraoperative x-ray confirmed my level, and then I used a combination of the high-speed drill and the Kerrison punches to perform a foraminotomy at L5-S1 on the left.  He had a very wide pars and facet from the spondylosis.  I drilled medially for probably 2 cm.   Drilled the superior part of the facet and the lateral part of the pars and the spondylitic facet.  I then brought in fluoroscopy to check AP and lateral fluoroscopy since it was difficult to know from a medial lateral standpoint where I was compared to the lateral part of the canal and pedicle. the underlying yellow ligament was opened and removed in a piecemeal fashion to expose the underlying exiting nerve root. I undercut the lateral recess and dissected down until I was medial to and distal to the L5 pedicle. The nerve root was well decompressed.  There was a ridge of bone as well as a disc protrusion just under the axilla of the nerve root.  Dr. Sherwood Gambler gently retracted the nerve root superiorly and we removed the disc herniation and the osteophyte pituitary rongeurs and a curette.  . I then palpated with a coronary dilator along the nerve root and into the foramen to assure adequate decompression. I felt no more compression of the nerve root. I irrigated with saline solution containing bacitracin. Achieved hemostasis with bipolar cautery, lined the dura with Depo-Medrol, and then closed the fascia with 0 Vicryl. I closed the subcutaneous tissues with 2-0 Vicryl and the subcuticular tissues with 3-0 Vicryl. The skin was then closed with benzoin and Steri-Strips. The drapes were removed, a sterile dressing was applied. The patient was awakened from general anesthesia and transferred to the recovery room in stable condition. At the end of the procedure all sponge, needle and instrument counts were correct.    PLAN OF CARE: Admit for overnight observation  PATIENT DISPOSITION:  PACU - hemodynamically stable.   Delay start of Pharmacological VTE agent (>24hrs) due to surgical blood loss or risk  of bleeding:  yes

## 2018-01-21 DIAGNOSIS — M5117 Intervertebral disc disorders with radiculopathy, lumbosacral region: Secondary | ICD-10-CM | POA: Diagnosis not present

## 2018-01-21 MED ORDER — SUGAMMADEX SODIUM 200 MG/2ML IV SOLN
INTRAVENOUS | Status: DC | PRN
  Administered 2018-01-20: 173.2 mg via INTRAVENOUS

## 2018-01-21 MED ORDER — OXYCODONE-ACETAMINOPHEN 7.5-325 MG PO TABS: 1.0000 | ORAL_TABLET | ORAL | 0 refills | Status: DC | PRN

## 2018-01-21 MED ORDER — METHOCARBAMOL 750 MG PO TABS: 750.0000 mg | ORAL_TABLET | Freq: Three times a day (TID) | ORAL | 1 refills | Status: DC | PRN

## 2018-01-21 MED ORDER — ASPIRIN 81 MG PO TABS: 81.0000 mg | ORAL_TABLET | Freq: Every day | ORAL | Status: AC

## 2018-01-21 NOTE — Progress Notes (Signed)
  NEUROSURGERY PROGRESS NOTE   No issues overnight.  Complains of appropriate back soreness Pre op mild left foot drop remains Tolerating po.   Voiding normal.  Eager for d/c  EXAM:  BP 130/77 (BP Location: Left Arm)   Pulse 92   Temp 97.9 F (36.6 C) (Oral)   Resp 18   Ht 6\' 1"  (1.854 m)   Wt 86.6 kg   SpO2 90%   BMI 25.20 kg/m   Awake, alert, oriented  Speech fluent, appropriate  CN grossly intact  Mild left footdrop, otherwise MAEW  PLAN Stable this am  Cleared for d/c

## 2018-01-21 NOTE — Discharge Instructions (Signed)
Wound Care °Keep incision covered and dry for two days.   °Do not put any creams, lotions, or ointments on incision. °Leave steri-strips on back.  They will fall off by themselves. °Activity °Walk each and every day, increasing distance each day. °No lifting greater than 5 lbs.  Avoid bending, lifting and twisting. °No driving for 2 weeks; may ride as a passenger locally. °Diet °Resume your normal diet.  °Return to Work °Will be discussed at you follow up appointment. °Call Your Doctor If Any of These Occur °Redness, drainage, or swelling at the wound.  °Temperature greater than 101 degrees. °Severe pain not relieved by pain medication. °Incision starts to come apart. °Follow Up Appt °Call today for appointment in 1-2 weeks ([phone]) or for problems.  If you have any hardware placed in your spine, you will need an x-ray before your appointment. ° °

## 2018-01-21 NOTE — Discharge Summary (Signed)
Physician Discharge Summary  Patient ID: Collin [name] MRN: [id number] DOB/AGE: Feb 02, 1940 77 y.o.  Admit date: 01/20/2018 Discharge date: 01/21/2018  Admission Diagnoses:  Left foot drop, lumbar HNP  Discharge Diagnoses:  Same Active Problems:   S/P lumbar laminectomy   Discharged Condition: Stable  Hospital Course:  Collin [name] is a 77 y.o. male who was admitted for the below procedure. There were no post operative complications. At time of discharge, pain was well controlled, ambulating with Pt/OT, tolerating po, voiding normal. Ready for discharge.  Treatments: Surgery - Left L5-S1 extraforaminal decompression and microdiscectomy utilizing microscopic dissection  Discharge Exam: Blood pressure 130/77, pulse 92, temperature 97.9 F (36.6 C), temperature source Oral, resp. rate 18, height 6\' 1"  (1.854 m), weight 86.6 kg, SpO2 90 %. Awake, alert, oriented Speech fluent, appropriate CN grossly intact 5/5 BUE/BLE with exception of mild left foot drop Wound with dried blood on bandage, no active drainage  Disposition: Discharge disposition: 01-Home or Self Care       Discharge Instructions    Call MD for:  difficulty breathing, headache or visual disturbances   Complete by:  As directed    Call MD for:  persistant dizziness or light-headedness   Complete by:  As directed    Call MD for:  redness, tenderness, or signs of infection (pain, swelling, redness, odor or green/yellow discharge around incision site)   Complete by:  As directed    Call MD for:  severe uncontrolled pain   Complete by:  As directed    Call MD for:  temperature >100.4   Complete by:  As directed    Diet general   Complete by:  As directed    Driving Restrictions   Complete by:  As directed    Do not drive until given clearance.   Increase activity slowly   Complete by:  As directed    Lifting restrictions   Complete by:  As directed    Do not lift anything >10lbs. Avoid bending  and twisting in awkward positions. Avoid bending at the back.   May shower / Bathe   Complete by:  As directed    In 24 hours. Okay to wash wound with warm soapy water. Avoid scrubbing the wound. Pat dry.   Remove dressing in 24 hours   Complete by:  As directed      Allergies as of 01/21/2018      Reactions   Penicillins Rash, Other (See Comments)   PATIENT HAS HAD A PCN REACTION WITH IMMEDIATE RASH, FACIAL/TONGUE/THROAT SWELLING, SOB, OR LIGHTHEADEDNESS WITH HYPOTENSION:  #  #  YES  #  #  Has patient had a PCN reaction causing severe rash involving mucus membranes or skin necrosis: No Has patient had a PCN reaction that required hospitalization: No Has patient had a PCN reaction occurring within the last 10 years: No If all of the above answers are "NO", then may proceed with Cephalosporin use.      Medication List    STOP taking these medications   ibuprofen 200 MG tablet Commonly known as:  ADVIL,MOTRIN     TAKE these medications   aspirin 81 MG tablet Take 1 tablet (81 mg total) by mouth at bedtime. Start taking on:  January 28, 2018 What changed:  These instructions start on January 28, 2018. If you are unsure what to do until then, ask your doctor or other care provider.   CHOLINE PO Take 250 mg by mouth at bedtime.  EQL COQ10 300 MG Caps Generic drug:  Coenzyme Q10 Take 300 mg by mouth daily.   fish oil-omega-3 fatty acids 1000 MG capsule Take 1 g by mouth daily.   GRAPE SEED PO Take 1 capsule by mouth daily.   methocarbamol 750 MG tablet Commonly known as:  ROBAXIN-750 Take 1 tablet (750 mg total) by mouth 3 (three) times daily as needed for muscle spasms.   metoprolol succinate 25 MG 24 hr tablet Commonly known as:  TOPROL-XL Take 1 tablet (25 mg total) by mouth daily.   MOVE FREE PO Take 1 tablet by mouth daily.   multivitamin tablet Take 1 tablet by mouth daily.   nitroGLYCERIN 0.4 MG SL tablet Commonly known as:  NITROSTAT Place 1 tablet (0.4 mg  total) under the tongue every 5 (five) minutes as needed for chest pain.   oxyCODONE-acetaminophen 7.5-325 MG tablet Commonly known as:  PERCOCET Take 1 tablet by mouth every 4 (four) hours as needed.   pyridOXINE 100 MG tablet Commonly known as:  VITAMIN B-6 Take 100 mg by mouth daily.   Saw Palmetto 450 MG Caps Take 450 mg by mouth at bedtime.   simvastatin 40 MG tablet Commonly known as:  ZOCOR TAKE 1 TABLET BY MOUTH ONCE DAILY AT 6PM   vitamin B-12 1000 MCG tablet Commonly known as:  CYANOCOBALAMIN Take 1,000 mcg by mouth daily.   Vitamin D3 125 MCG (5000 UT) Caps Take 5,000 Units by mouth at bedtime.      Follow-up Information    Eustace Moore, MD Follow up.   Specialty:  Neurosurgery Contact information: [address] [place] [address] [phone]           Signed: Traci Sermon 01/21/2018, 8:40 AM

## 2018-01-21 NOTE — Progress Notes (Signed)
Patient is discharged from room 3C09 at this time. Alert and in stable condition. IV site d/c'd and instructions read to patient and spouse with understanding verbalized. Left unit via wheelchair with all belongings at side. 

## 2018-01-22 NOTE — Anesthesia Postprocedure Evaluation (Signed)
Anesthesia Post Note  Patient: Collin [name]  Procedure(s) Performed: Left Lumbar five Sacral one extraforaminal decompression (Left Back)     Patient location during evaluation: PACU Anesthesia Type: General Level of consciousness: sedated and patient cooperative Pain management: pain level controlled Vital Signs Assessment: post-procedure vital signs reviewed and stable Respiratory status: spontaneous breathing Cardiovascular status: stable Anesthetic complications: no    Last Vitals:  Vitals:   01/21/18 0329 01/21/18 0714  BP: 128/80 130/77  Pulse: 80 92  Resp: 20 18  Temp: 36.7 C 36.6 C  SpO2: 93% 90%    Last Pain:  Vitals:   01/21/18 0800  TempSrc:   PainSc: 2                  Nolon Nations

## 2018-01-23 ENCOUNTER — Encounter (HOSPITAL_COMMUNITY): Payer: Self-pay | Admitting: Neurological Surgery

## 2018-01-26 ENCOUNTER — Telehealth: Payer: Self-pay | Admitting: *Deleted

## 2018-01-26 NOTE — Telephone Encounter (Signed)
Left message on machine for patient to schedule a follow up hospital visit. CRM

## 2018-02-07 DIAGNOSIS — Z961 Presence of intraocular lens: Secondary | ICD-10-CM | POA: Diagnosis not present

## 2018-02-13 ENCOUNTER — Other Ambulatory Visit: Payer: Self-pay | Admitting: Cardiovascular Disease

## 2018-02-14 ENCOUNTER — Other Ambulatory Visit: Payer: Self-pay | Admitting: Cardiovascular Disease

## 2018-02-14 NOTE — Telephone Encounter (Signed)
Outpatient Medication Detail    Disp Refills Start End   simvastatin (ZOCOR) 40 MG tablet 90 tablet 3 12/28/2017    Sig: TAKE 1 TABLET BY MOUTH ONCE DAILY AT 6PM   Sent to pharmacy as: simvastatin (ZOCOR) 40 MG tablet   E-Prescribing Status: Sent to pharmacy (12/28/2017 3:18 PM EST)   Pharmacy   [id number] - [name], [address] AT [address]

## 2018-02-20 ENCOUNTER — Ambulatory Visit: Payer: PPO | Attending: Neurological Surgery | Admitting: Physical Therapy

## 2018-02-20 ENCOUNTER — Other Ambulatory Visit: Payer: Self-pay

## 2018-02-20 ENCOUNTER — Encounter: Payer: Self-pay | Admitting: Physical Therapy

## 2018-02-20 DIAGNOSIS — M6281 Muscle weakness (generalized): Secondary | ICD-10-CM | POA: Insufficient documentation

## 2018-02-20 DIAGNOSIS — M545 Low back pain, unspecified: Secondary | ICD-10-CM

## 2018-02-20 DIAGNOSIS — G8929 Other chronic pain: Secondary | ICD-10-CM | POA: Insufficient documentation

## 2018-02-20 DIAGNOSIS — R293 Abnormal posture: Secondary | ICD-10-CM | POA: Insufficient documentation

## 2018-02-21 ENCOUNTER — Encounter: Payer: Self-pay | Admitting: Physical Therapy

## 2018-02-21 NOTE — Therapy (Signed)
[place], [place], [address] Phone: [phone]   Fax:  [phone]  Physical Therapy Evaluation  Patient Details  Name: Collin [name] MRN: [id number] Date of Birth: 11/15/1940 Referring Provider (PT): Dr Sherley Bounds    Encounter Date: 02/20/2018  PT End of Session - 02/20/18 1607    Visit Number  1    Number of Visits  16    Date for PT Re-Evaluation  04/17/18    Authorization Type  [employer]     PT Start Time  0300    PT Stop Time  0350    PT Time Calculation (min)  50 min    Activity Tolerance  Patient tolerated treatment well    Behavior During Therapy  [facility] for tasks assessed/performed       Past Medical History:  Diagnosis Date  . Allergy   . Arthritis   . BPH (benign prostatic hyperplasia)   . CAD (coronary artery disease)   . Cataract   . Diverticulosis of colon   . Hx of adenomatous colonic polyps 05/22/2014  . Hyperlipidemia   . Hypertension   . MYOCARDIAL INFARCTION, HX OF 07/20/2006   Qualifier: Diagnosis of  By: Leanne Chang MD, Bruce      Past Surgical History:  Procedure Laterality Date  . CATARACT EXTRACTION     Bil  . COLONOSCOPY    . EYE SURGERY    . LUMBAR LAMINECTOMY/DECOMPRESSION MICRODISCECTOMY Left 01/20/2018   Procedure: Left Lumbar five Sacral one extraforaminal decompression;  Surgeon: Eustace Moore, MD;  Location: [facility];  Service: Neurosurgery;  Laterality: Left;  . PTCA      There were no vitals filed for this visit.   Subjective Assessment - 02/20/18 1514    Subjective  Patient has along history of lower back pain. He has a 4 year hisotry of foot drop. On 01/21/2019 he had an L5/S1 decrompession, Since that point he reports no real change in his pain or foot drop. He is being fit at this time for an AFO. He reports pain when he is starting to wlsak or when he twists. It dosent make a difference how long he stands for.     Diagnostic tests  Nothing  post op     Patient Stated Goals  to have less pain in his back and be able to move better     Currently in Pain?  Yes    Pain Score  5     Pain Location  Back    Pain Orientation  Mid;Left    Pain Descriptors / Indicators  Aching    Pain Type  Chronic pain;Surgical pain    Pain Onset  More than a month ago    Pain Frequency  Constant    Aggravating Factors   Sitting for a period of time then moving     Pain Relieving Factors  Nothing     Effect of Pain on Daily Activities  difficulty walking for long periods of time. Unable to play golf         [facility] PT Assessment - 02/21/18 0001      Assessment   Medical Diagnosis  Lumbar decompresion with left foot drop     Referring Provider (PT)  Dr Sherley Bounds     Onset Date/Surgical Date  --   01/21/2019    Hand Dominance  Right    Next MD Visit  2 weeks     Prior  Therapy  Yes to try to avoid surgery.       Precautions   Precaution Comments  Patient reports a 20 lb lifting restriction but he can not quaite remmeber       Restrictions   Weight Bearing Restrictions  No      Balance Screen   Has the patient fallen in the past 6 months  No    Has the patient had a decrease in activity level because of a fear of falling?   No    Is the patient reluctant to leave their home because of a fear of falling?   No      Home Environment   Additional Comments  Has stairs but does not have a lot of problem with steps       Prior Function   Level of Independence  Independent    Vocation  Retired    Office manager; Exercise       Cognition   Overall Cognitive Status  Within Functional Limits for tasks assessed    Attention  Focused    Focused Attention  Appears intact    Memory  Appears intact    Awareness  Appears intact    Problem Solving  Appears intact      Observation/Other Assessments   Focus on Therapeutic Outcomes (FOTO)   51% limitation       Sensation   Additional Comments  Pain that radiates down into his left outer leg        Coordination   Gross Motor Movements are Fluid and Coordinated  Yes    Fine Motor Movements are Fluid and Coordinated  Yes      AROM   Lumbar Flexion  62    Lumbar Extension  12    Lumbar - Right Rotation  limited 25% no pain     Lumbar - Left Rotation  limited 25% with no pain       PROM   Overall PROM Comments  full passive ROM of the hip bilateral       Strength   Overall Strength Comments  right knee and hip 5/5     Strength Assessment Site  Ankle;Knee;Hip    Right/Left Hip  Left    Left Hip Flexion  4/5    Right/Left Knee  Left    Left Knee Flexion  5/5    Left Knee Extension  4+/5    Right/Left Ankle  Left    Left Ankle Dorsiflexion  4/5   Patient suprisingly has active DF    Left Ankle Plantar Flexion  --   not tested    Left Ankle Inversion  4+/5    Left Ankle Eversion  4/5      Palpation   Palpation comment  spasming of the lumbar paraspinals L > R       Ambulation/Gait   Gait Comments  Patients foot drop appears to happen with heel strike. Hhas decreased single leg stance time on the left                 Objective measurements completed on examination: See above findings.      [facility] Adult PT Treatment/Exercise - 02/21/18 0001      Lumbar Exercises: Stretches   Active Hamstring Stretch Limitations  seated hamstring stretch for pre walking 2x20 sec with cuing     Other Lumbar Stretch Exercise  piriformis stretch 3x20 sec hold       Lumbar Exercises: Supine  Bent Knee Raise Limitations  supine bent knee raise 2x21             PT Education - 02/20/18 1606    Education Details  HEP, improtnace of a slow progression in exercises     Person(s) Educated  Patient    Methods  Explanation;Demonstration;Verbal cues;Tactile cues    Comprehension  Verbalized understanding;Returned demonstration;Verbal cues required;Tactile cues required       PT Short Term Goals - 02/20/18 1646      PT SHORT TERM GOAL #1   Title  Patient will demonstrate  4+/5 ankle dorsi flexion     Time  4    Period  Weeks    Status  New    Target Date  03/20/18      PT SHORT TERM GOAL #2   Title  Patient will report 3/10 pain at worst with gait     Time  4    Period  Weeks    Status  New    Target Date  03/20/18      PT SHORT TERM GOAL #3   Title  Patient will be independent with inital HEP     Time  4    Period  Weeks    Status  New    Target Date  03/20/18        PT Long Term Goals - 02/20/18 1648      PT LONG TERM GOAL #1   Title  Patient will walk 3000' without increased pain     Time  8    Status  New      PT LONG TERM GOAL #2   Title  Patient will demonstrate a 51% limitation on FOTO     Time  8    Period  Weeks    Status  New    Target Date  04/17/18      PT LONG TERM GOAL #3   Title  He will improve DF to 4-/5 strength to improve gait stability    Time  8    Period  Weeks    Status  New             Plan - 02/20/18 1608    Clinical Impression Statement  Patient is a 78 year old male S/P L5-S1 decompression. He has a long history of foot drop. He feels like after the surgery notning is better. He is having just as much pain and his foot drop is still there. Therapy advised him that he is still early on in his recovery process. He has spasming in his lumbar spine L > R. He has decreased strength in his dorsi flexors and hip extensors but he does have strength. His lumbar motion is good consdiering he recently had spinal decompression. His C/O at this time is when he initially stands he feels a sharp pain in his lower back. He has active Dorsi flexion on the left but his foot is not controlled after inital heel strike. He feels like this exacerbates his achillies tendinitis at times. He would benefit from skilled therapy to improve his ability to walk andto hopefully return to golf.     History and Personal Factors relevant to plan of care:  Lumbar surgery, foot drop     Clinical Presentation  Evolving    Clinical  Presentation due to:  Pain that increases with ambualtion     Clinical Decision Making  Moderate    Rehab Potential  Good  PT Frequency  2x / week    PT Duration  8 weeks    PT Treatment/Interventions  ADLs/Self Care Home Management;Cryotherapy;Electrical Stimulation;Iontophoresis 4mg /ml Dexamethasone;Ultrasound;Gait training;Stair training;Functional mobility training;Therapeutic activities;Therapeutic exercise;Neuromuscular re-education;Patient/family education;Manual techniques;Passive range of motion;Taping;Energy conservation    PT Next Visit Plan  consider manual therapy but be aware there is astill a scb; add active DF strengtrhening; give core exercises; consder clam shell; ball squeeze; bridging if tolerated; consider standing hip exercises;     PT Home Exercise Plan  posterior pelvic tilt; supine march; piriformis stretch; Seated hamstring stretch with cuing for hip hinge     Consulted and Agree with Plan of Care  Patient       Patient will benefit from skilled therapeutic intervention in order to improve the following deficits and impairments:  Pain, Postural dysfunction, Decreased activity tolerance, Decreased endurance, Decreased strength, Decreased range of motion, Decreased scar mobility, Difficulty walking  Visit Diagnosis: Chronic left-sided low back pain without sciatica  Muscle weakness (generalized)  Abnormal posture     Problem List Patient Active Problem List   Diagnosis Date Noted  . S/P lumbar laminectomy 01/20/2018  . Essential tremor 11/02/2017  . Unstable gait 11/02/2017  . Lumbar radiculopathy 07/19/2017  . Peripheral neuropathy 12/06/2016  . Hx of adenomatous colonic polyps 05/22/2014  . Exertional angina ([place]) 09/28/2011  . Left inguinal hernia 09/10/2011  . PSA, INCREASED 08/05/2009  . CONGENITAL PIGMENTARY ANOMALY OF SKIN 07/25/2007  . Dyslipidemia 07/20/2006  . Coronary atherosclerosis 07/20/2006  . BPH associated with nocturia 07/20/2006     Carney Living PT DPT  02/21/2018, 1:54 PM  [facility] [address] [place], [place], [address] Phone: [phone]   Fax:  [phone]  Name: Collin [name] MRN: [id number] Date of Birth: [birth year]

## 2018-02-23 ENCOUNTER — Ambulatory Visit: Payer: PPO | Admitting: Physical Therapy

## 2018-02-23 ENCOUNTER — Encounter: Payer: Self-pay | Admitting: Physical Therapy

## 2018-02-23 DIAGNOSIS — M6281 Muscle weakness (generalized): Secondary | ICD-10-CM

## 2018-02-23 DIAGNOSIS — M545 Low back pain: Secondary | ICD-10-CM | POA: Diagnosis not present

## 2018-02-23 DIAGNOSIS — R293 Abnormal posture: Secondary | ICD-10-CM

## 2018-02-23 DIAGNOSIS — G8929 Other chronic pain: Secondary | ICD-10-CM

## 2018-02-23 NOTE — Therapy (Signed)
[place], [place], [address] Phone: [phone]   Fax:  [phone]  Physical Therapy Treatment  Patient Details  Name: Collin [name] MRN: [id number] Date of Birth: Jan 11, 1941 Referring Provider (PT): Dr Sherley Bounds    Encounter Date: 02/23/2018  PT End of Session - 02/23/18 1757    Visit Number  2    Number of Visits  16    Date for PT Re-Evaluation  04/17/18    Authorization Type  [employer]     PT Start Time  1548    PT Stop Time  1632    PT Time Calculation (min)  44 min    Activity Tolerance  Patient tolerated treatment well    Behavior During Therapy  [facility] for tasks assessed/performed       Past Medical History:  Diagnosis Date  . Allergy   . Arthritis   . BPH (benign prostatic hyperplasia)   . CAD (coronary artery disease)   . Cataract   . Diverticulosis of colon   . Hx of adenomatous colonic polyps 05/22/2014  . Hyperlipidemia   . Hypertension   . MYOCARDIAL INFARCTION, HX OF 07/20/2006   Qualifier: Diagnosis of  By: Leanne Chang MD, Bruce      Past Surgical History:  Procedure Laterality Date  . CATARACT EXTRACTION     Bil  . COLONOSCOPY    . EYE SURGERY    . LUMBAR LAMINECTOMY/DECOMPRESSION MICRODISCECTOMY Left 01/20/2018   Procedure: Left Lumbar five Sacral one extraforaminal decompression;  Surgeon: Eustace Moore, MD;  Location: [facility];  Service: Neurosurgery;  Laterality: Left;  . PTCA      There were no vitals filed for this visit.  Subjective Assessment - 02/23/18 1641    Subjective   i have been doing the exercises. I have hip pain with the stretch just when ilift the knee.    Currently in Pain?  Yes    Pain Score  5     Pain Location  Back    Pain Orientation  Mid;Left    Pain Descriptors / Indicators  Aching   tender   Pain Type  Chronic pain    Aggravating Factors   walking,  lifting leg to stretch while sitting.    Pain Relieving Factors  walking on  grass                       [facility] Adult PT Treatment/Exercise - 02/23/18 0001      Ambulation/Gait   Gait Comments  practiced with cues.  suggestes softer walking,  foot slap less evident.   Foot control continues to be a challange.     has lateral shift and poor trunk rotation     Lumbar Exercises: Stretches   Passive Hamstring Stretch  3 reps;30 seconds    Piriformis Stretch  3 reps;10 seconds    Piriformis Stretch Limitations  supine , knee to opposite shoulder.      Figure 4 Stretch  1 rep    Figure 4 Stretch Limitations  painful to get into position sitting    Other Lumbar Stretch Exercise  Mckenzie lateral technique , left side facing wall      Lumbar Exercises: Supine   Pelvic Tilt  20 reps    Pelvic Tilt Limitations  mod max cues.     Bent Knee Raise  15 reps    Bent Knee Raise Limitations  mod/ max cues for  abdominal activation and control.    Large Ball Abdominal Isometric  10 reps    Large Ball Abdominal Isometric Limitations   X 5 each side.  able to activate abdominals      Manual Therapy   Manual therapy comments  soft tissue work away fron scat left low back and gluteal.  tissue softened.   patient resisted stretching of QL  hard for him to relax,  it was not hurting him.       Ankle Exercises: Seated   Other Seated Ankle Exercises  DF with knee 90 degrees  hold  relxax10 X,  HEP             PT Education - 02/23/18 1757    Education Details  Exercise form, gait,   HEP    Person(s) Educated  Patient    Methods  Explanation;Demonstration;Tactile cues;Verbal cues;Handout    Comprehension  Returned demonstration;Verbalized understanding;Need further instruction       PT Short Term Goals - 02/20/18 1646      PT SHORT TERM GOAL #1   Title  Patient will demonstrate 4+/5 ankle dorsi flexion     Time  4    Period  Weeks    Status  New    Target Date  03/20/18      PT SHORT TERM GOAL #2   Title  Patient will report 3/10 pain at worst with  gait     Time  4    Period  Weeks    Status  New    Target Date  03/20/18      PT SHORT TERM GOAL #3   Title  Patient will be independent with inital HEP     Time  4    Period  Weeks    Status  New    Target Date  03/20/18        PT Long Term Goals - 02/20/18 1648      PT LONG TERM GOAL #1   Title  Patient will walk 3000' without increased pain     Time  8    Status  New      PT LONG TERM GOAL #2   Title  Patient will demonstrate a 51% limitation on FOTO     Time  8    Period  Weeks    Status  New    Target Date  04/17/18      PT LONG TERM GOAL #3   Title  He will improve DF to 4-/5 strength to improve gait stability    Time  8    Period  Weeks    Status  New            Plan - 02/23/18 1758    Clinical Impression Statement  Mod max cues needed for HEP  extra time needed as patient tends to rush.  He is waiting from insurance to see about AFO prior to getting measured.  LATERAL SHIFT improved temporarily with McKenzie lateral technique.      PT Next Visit Plan  consider manual therapy but be aware there is astill a scab; add active DF strengtrhening; give core exercises; consder clam shell; ball squeeze; bridging if tolerated; consider standing hip exercises;     PT Home Exercise Plan  posterior pelvic tilt; supine march; piriformis stretch; Seated hamstring stretch with cuing for hip hinge     Consulted and Agree with Plan of Care  Patient  Patient will benefit from skilled therapeutic intervention in order to improve the following deficits and impairments:     Visit Diagnosis: Chronic left-sided low back pain without sciatica  Muscle weakness (generalized)  Abnormal posture     Problem List Patient Active Problem List   Diagnosis Date Noted  . S/P lumbar laminectomy 01/20/2018  . Essential tremor 11/02/2017  . Unstable gait 11/02/2017  . Lumbar radiculopathy 07/19/2017  . Peripheral neuropathy 12/06/2016  . Hx of adenomatous colonic polyps  05/22/2014  . Exertional angina ([place]) 09/28/2011  . Left inguinal hernia 09/10/2011  . PSA, INCREASED 08/05/2009  . CONGENITAL PIGMENTARY ANOMALY OF SKIN 07/25/2007  . Dyslipidemia 07/20/2006  . Coronary atherosclerosis 07/20/2006  . BPH associated with nocturia 07/20/2006    HARRIS,KAREN  PTA 02/23/2018, 6:11 PM  [place], [place], [address] Phone: [phone]   Fax:  [phone]  Name: Collin [name] MRN: [id number] Date of Birth: 11-22-1940

## 2018-02-27 ENCOUNTER — Ambulatory Visit: Payer: PPO | Attending: Neurological Surgery | Admitting: Physical Therapy

## 2018-02-27 ENCOUNTER — Encounter: Payer: Self-pay | Admitting: Physical Therapy

## 2018-02-27 DIAGNOSIS — M545 Low back pain, unspecified: Secondary | ICD-10-CM

## 2018-02-27 DIAGNOSIS — G8929 Other chronic pain: Secondary | ICD-10-CM | POA: Diagnosis not present

## 2018-02-27 DIAGNOSIS — R293 Abnormal posture: Secondary | ICD-10-CM | POA: Diagnosis not present

## 2018-02-27 DIAGNOSIS — M6281 Muscle weakness (generalized): Secondary | ICD-10-CM | POA: Diagnosis not present

## 2018-02-28 ENCOUNTER — Encounter: Payer: Self-pay | Admitting: Physical Therapy

## 2018-02-28 NOTE — Therapy (Signed)
[place], [place], [address] Phone: [phone]   Fax:  [phone]  Physical Therapy Treatment  Patient Details  Name: Collin [name] MRN: [id number] Date of Birth: [birth year] Referring Provider (PT): Dr Sherley Bounds    Encounter Date: 02/27/2018  PT End of Session - 02/27/18 1423    Visit Number  3    Number of Visits  16    Date for PT Re-Evaluation  04/17/18    Authorization Type  [employer]     PT Start Time  1415    PT Stop Time  1455    PT Time Calculation (min)  40 min    Activity Tolerance  Patient tolerated treatment well    Behavior During Therapy  [facility] for tasks assessed/performed       Past Medical History:  Diagnosis Date  . Allergy   . Arthritis   . BPH (benign prostatic hyperplasia)   . CAD (coronary artery disease)   . Cataract   . Diverticulosis of colon   . Hx of adenomatous colonic polyps 05/22/2014  . Hyperlipidemia   . Hypertension   . MYOCARDIAL INFARCTION, HX OF 07/20/2006   Qualifier: Diagnosis of  By: Leanne Chang MD, Bruce      Past Surgical History:  Procedure Laterality Date  . CATARACT EXTRACTION     Bil  . COLONOSCOPY    . EYE SURGERY    . LUMBAR LAMINECTOMY/DECOMPRESSION MICRODISCECTOMY Left 01/20/2018   Procedure: Left Lumbar five Sacral one extraforaminal decompression;  Surgeon: Eustace Moore, MD;  Location: [facility];  Service: Neurosurgery;  Laterality: Left;  . PTCA      There were no vitals filed for this visit.  Subjective Assessment - 02/27/18 1417    Subjective  Patient reports he wants to do more. He has been doing his exercises but not sure they are doing much. Therapy assured him we would progress him but we need to do it in a slow steady manner.     Diagnostic tests  Nothing post op     Patient Stated Goals  to have less pain in his back and be able to move better     Currently in Pain?  No/denies    Effect of Pain on Daily Activities   difficulty wwdting                        [facility] Adult PT Treatment/Exercise - 02/28/18 0001      Lumbar Exercises: Stretches   Passive Hamstring Stretch  3 reps;30 seconds    Piriformis Stretch  3 reps;10 seconds    Piriformis Stretch Limitations  supine , knee to opposite shoulder.        Lumbar Exercises: Aerobic   Elliptical  5 min no increase in pain       Lumbar Exercises: Standing   Other Standing Lumbar Exercises  chop red band 2x10 each way; pallof press 2x10 red;     Other Standing Lumbar Exercises  standing march 2x10       Lumbar Exercises: Supine   Pelvic Tilt  20 reps    Bent Knee Raise  15 reps    Bridge  10 reps    Bridge Limitations  with green band iso     Other Supine Lumbar Exercises  supine clamshell green x20              PT Education - 02/27/18 1423  Education Details  updated HEP for rotational strengthening     Person(s) Educated  Patient    Methods  Explanation;Demonstration;Tactile cues;Verbal cues    Comprehension  Verbalized understanding;Returned demonstration;Verbal cues required;Tactile cues required;Need further instruction       PT Short Term Goals - 02/28/18 1023      PT SHORT TERM GOAL #1   Title  Patient will demonstrate 4+/5 ankle dorsi flexion     Period  Weeks    Status  On-going      PT SHORT TERM GOAL #2   Title  Patient will report 3/10 pain at worst with gait     Time  4    Period  Weeks    Status  On-going      PT SHORT TERM GOAL #3   Title  Patient will be independent with inital HEP     Time  4    Period  Weeks    Status  On-going        PT Long Term Goals - 02/20/18 1648      PT LONG TERM GOAL #1   Title  Patient will walk 3000' without increased pain     Time  8    Status  New      PT LONG TERM GOAL #2   Title  Patient will demonstrate a 51% limitation on FOTO     Time  8    Period  Weeks    Status  New    Target Date  04/17/18      PT LONG TERM GOAL #3   Title  He will  improve DF to 4-/5 strength to improve gait stability    Time  8    Period  Weeks    Status  New            Plan - 02/27/18 1508    Clinical Impression Statement  Therapy added in rotational strengthening exercises this visit. He continually states that hi back only hurts when he turns. Therapy advised him that he needs to strengthen those movements in order for the pain to change and that will take time. The patient wanted to try the elliptical. He had no increase inpain with the elliptical.     Clinical Presentation  Evolving    Clinical Decision Making  Moderate    Rehab Potential  Good    PT Frequency  2x / week    PT Treatment/Interventions  ADLs/Self Care Home Management;Cryotherapy;Electrical Stimulation;Iontophoresis 4mg /ml Dexamethasone;Ultrasound;Gait training;Stair training;Functional mobility training;Therapeutic activities;Therapeutic exercise;Neuromuscular re-education;Patient/family education;Manual techniques;Passive range of motion;Taping;Energy conservation    PT Next Visit Plan  consider manual therapy but be aware there is astill a scab; add active DF strengtrhening; give core exercises; consder clam shell; ball squeeze; bridging if tolerated; consider standing hip exercises;     PT Home Exercise Plan  posterior pelvic tilt; supine march; piriformis stretch; Seated hamstring stretch with cuing for hip hinge     Consulted and Agree with Plan of Care  Patient       Patient will benefit from skilled therapeutic intervention in order to improve the following deficits and impairments:  Pain, Postural dysfunction, Decreased activity tolerance, Decreased endurance, Decreased strength, Decreased range of motion, Decreased scar mobility, Difficulty walking  Visit Diagnosis: Chronic left-sided low back pain without sciatica  Muscle weakness (generalized)  Abnormal posture     Problem List Patient Active Problem List   Diagnosis Date Noted  . S/P lumbar laminectomy  01/20/2018  .  Essential tremor 11/02/2017  . Unstable gait 11/02/2017  . Lumbar radiculopathy 07/19/2017  . Peripheral neuropathy 12/06/2016  . Hx of adenomatous colonic polyps 05/22/2014  . Exertional angina ([place]) 09/28/2011  . Left inguinal hernia 09/10/2011  . PSA, INCREASED 08/05/2009  . CONGENITAL PIGMENTARY ANOMALY OF SKIN 07/25/2007  . Dyslipidemia 07/20/2006  . Coronary atherosclerosis 07/20/2006  . BPH associated with nocturia 07/20/2006    Carney Living PT DPT  02/28/2018, 10:29 AM  [facility] [address] [place], [place], [address] Phone: [phone]   Fax:  [phone]  Name: Collin [name] MRN: [id number] Date of Birth: 19-Sep-1940

## 2018-03-03 ENCOUNTER — Encounter

## 2018-03-09 ENCOUNTER — Ambulatory Visit: Payer: PPO | Admitting: Physical Therapy

## 2018-03-09 DIAGNOSIS — M545 Low back pain, unspecified: Secondary | ICD-10-CM

## 2018-03-09 DIAGNOSIS — G8929 Other chronic pain: Secondary | ICD-10-CM

## 2018-03-09 DIAGNOSIS — M6281 Muscle weakness (generalized): Secondary | ICD-10-CM

## 2018-03-09 DIAGNOSIS — R293 Abnormal posture: Secondary | ICD-10-CM

## 2018-03-10 ENCOUNTER — Encounter: Payer: Self-pay | Admitting: Physical Therapy

## 2018-03-10 NOTE — Therapy (Signed)
[place], [place], [address] Phone: [phone]   Fax:  [phone]  Physical Therapy Treatment  Patient Details  Name: Collin [name] MRN: [id number] Date of Birth: September 10, 1940 Referring Provider (PT): Dr Sherley Bounds    Encounter Date: 03/09/2018  PT End of Session - 03/10/18 0652    Visit Number  4    Number of Visits  16    Date for PT Re-Evaluation  04/17/18    Authorization Type  [employer]     PT Start Time  [phone]    PT Stop Time  1711    PT Time Calculation (min)  40 min    Activity Tolerance  Patient tolerated treatment well    Behavior During Therapy  [facility] for tasks assessed/performed       Past Medical History:  Diagnosis Date  . Allergy   . Arthritis   . BPH (benign prostatic hyperplasia)   . CAD (coronary artery disease)   . Cataract   . Diverticulosis of colon   . Hx of adenomatous colonic polyps 05/22/2014  . Hyperlipidemia   . Hypertension   . MYOCARDIAL INFARCTION, HX OF 07/20/2006   Qualifier: Diagnosis of  By: Leanne Chang MD, Bruce      Past Surgical History:  Procedure Laterality Date  . CATARACT EXTRACTION     Bil  . COLONOSCOPY    . EYE SURGERY    . LUMBAR LAMINECTOMY/DECOMPRESSION MICRODISCECTOMY Left 01/20/2018   Procedure: Left Lumbar five Sacral one extraforaminal decompression;  Surgeon: Eustace Moore, MD;  Location: [facility];  Service: Neurosurgery;  Laterality: Left;  . PTCA      There were no vitals filed for this visit.  Subjective Assessment - 03/10/18 0650    Subjective  Patient does not seem to be having any improvement. He has started working on the elliptical but on his third trial he had some pain. He did not try again. He has been working on his exercises. He was advised that this may take time.     Diagnostic tests  Nothing post op     Patient Stated Goals  to have less pain in his back and be able to move better     Currently in Pain?   No/denies   Just has pain when he first stands up                       [facility] Adult PT Treatment/Exercise - 03/10/18 0001      Lumbar Exercises: Stretches   Passive Hamstring Stretch  3 reps;30 seconds    Piriformis Stretch  3 reps;10 seconds    Piriformis Stretch Limitations  supine , knee to opposite shoulder.        Lumbar Exercises: Aerobic   Elliptical  5 min no increase in pain       Lumbar Exercises: Standing   Other Standing Lumbar Exercises  chop red band 2x10 each way; pallof press 2x10 red;     Other Standing Lumbar Exercises  standing march 2x10       Manual Therapy   Manual Therapy  Joint mobilization;Soft tissue mobilization    Joint Mobilization  PA glide of the left hip     Soft tissue mobilization  to lumbar spine uising IASTYM and manual soft tissue mobilization              PT Education - 03/10/18 0651    Education Details  reviewed the improtance of strengthening; thory behind manual therapy     Person(s) Educated  Patient    Methods  Explanation;Demonstration;Verbal cues;Tactile cues    Comprehension  Verbalized understanding;Returned demonstration;Verbal cues required;Tactile cues required       PT Short Term Goals - 03/10/18 0656      PT SHORT TERM GOAL #1   Title  Patient will demonstrate 4+/5 ankle dorsi flexion     Time  4    Period  Weeks    Status  On-going      PT SHORT TERM GOAL #2   Title  Patient will report 3/10 pain at worst with gait     Baseline  difficult to get an acurate pain level from the patient     Time  4    Period  Weeks    Status  On-going      PT SHORT TERM GOAL #3   Title  Patient will be independent with inital HEP     Time  4    Period  Weeks    Status  On-going        PT Long Term Goals - 02/20/18 1648      PT LONG TERM GOAL #1   Title  Patient will walk 3000' without increased pain     Time  8    Status  New      PT LONG TERM GOAL #2   Title  Patient will demonstrate a 51%  limitation on FOTO     Time  8    Period  Weeks    Status  New    Target Date  04/17/18      PT LONG TERM GOAL #3   Title  He will improve DF to 4-/5 strength to improve gait stability    Time  8    Period  Weeks    Status  New            Plan - 03/10/18 3546    Clinical Impression Statement  Therapy perfromed manual therapy on the patients lower back and hip. It is difficult to tell if anything helps or hurts. The patient seemed to be in some pain after the treatment but he reported it is the same pain that he is alway in. Therapy perfromed Posterior hip mobilization to improve hip flexion. Therapy also perfroemd LAD and manual trigger point release to lumbar spine to reduce compression. It is questionable if anything helped. Therapy advanced patient to a green band for rotational strengthening. He is doing all his exercises. Patient adavised to look for small improvements. Patient advised that if he wants specific times ( late Thursday afternoon only) he needs to schedule ahead of time. Therapy will work with patient 1-2 more visits unless he starts to show some improvement.     Clinical Presentation  Evolving    Clinical Decision Making  Moderate    Rehab Potential  Good    PT Frequency  2x / week    PT Duration  8 weeks    PT Treatment/Interventions  ADLs/Self Care Home Management;Cryotherapy;Electrical Stimulation;Iontophoresis 4mg /ml Dexamethasone;Ultrasound;Gait training;Stair training;Functional mobility training;Therapeutic activities;Therapeutic exercise;Neuromuscular re-education;Patient/family education;Manual techniques;Passive range of motion;Taping;Energy conservation    PT Next Visit Plan  consider manual therapy but be aware there is astill a scab; add active DF strengtrhening; give core exercises; consder clam shell; ball squeeze; bridging if tolerated; consider standing hip exercises;     PT Home Exercise Plan  posterior pelvic tilt; supine  march; piriformis stretch;  Seated hamstring stretch with cuing for hip hinge     Consulted and Agree with Plan of Care  Patient       Patient will benefit from skilled therapeutic intervention in order to improve the following deficits and impairments:  Pain, Postural dysfunction, Decreased activity tolerance, Decreased endurance, Decreased strength, Decreased range of motion, Decreased scar mobility, Difficulty walking  Visit Diagnosis: Chronic left-sided low back pain without sciatica  Muscle weakness (generalized)  Abnormal posture     Problem List Patient Active Problem List   Diagnosis Date Noted  . S/P lumbar laminectomy 01/20/2018  . Essential tremor 11/02/2017  . Unstable gait 11/02/2017  . Lumbar radiculopathy 07/19/2017  . Peripheral neuropathy 12/06/2016  . Hx of adenomatous colonic polyps 05/22/2014  . Exertional angina ([place]) 09/28/2011  . Left inguinal hernia 09/10/2011  . PSA, INCREASED 08/05/2009  . CONGENITAL PIGMENTARY ANOMALY OF SKIN 07/25/2007  . Dyslipidemia 07/20/2006  . Coronary atherosclerosis 07/20/2006  . BPH associated with nocturia 07/20/2006    Carney Living PT DPT  03/10/2018, 7:01 AM  [facility] [address] [place], [place], [address] Phone: [phone]   Fax:  [phone]  Name: Collin [name] MRN: [id number] Date of Birth: 1940-06-29

## 2018-03-20 ENCOUNTER — Ambulatory Visit: Payer: PPO | Admitting: Physical Therapy

## 2018-03-20 ENCOUNTER — Encounter: Payer: Self-pay | Admitting: Physical Therapy

## 2018-03-20 DIAGNOSIS — M6281 Muscle weakness (generalized): Secondary | ICD-10-CM

## 2018-03-20 DIAGNOSIS — M545 Low back pain, unspecified: Secondary | ICD-10-CM

## 2018-03-20 DIAGNOSIS — R293 Abnormal posture: Secondary | ICD-10-CM

## 2018-03-20 DIAGNOSIS — G8929 Other chronic pain: Secondary | ICD-10-CM

## 2018-03-21 ENCOUNTER — Encounter: Payer: Self-pay | Admitting: Physical Therapy

## 2018-03-21 NOTE — Therapy (Signed)
[place], [place], [address] Phone: [phone]   Fax:  [phone]  Physical Therapy Treatment  Patient Details  Name: Collin [name] MRN: [id number] Date of Birth: Jun 24, 1940 Referring Provider (PT): Dr Sherley Bounds    Encounter Date: 03/20/2018  PT End of Session - 03/20/18 1554    Visit Number  5    Number of Visits  16    Date for PT Re-Evaluation  04/17/18    Authorization Type  [employer]     PT Start Time  1330    PT Stop Time  1412    PT Time Calculation (min)  42 min    Activity Tolerance  Patient tolerated treatment well    Behavior During Therapy  [facility] for tasks assessed/performed       Past Medical History:  Diagnosis Date  . Allergy   . Arthritis   . BPH (benign prostatic hyperplasia)   . CAD (coronary artery disease)   . Cataract   . Diverticulosis of colon   . Hx of adenomatous colonic polyps 05/22/2014  . Hyperlipidemia   . Hypertension   . MYOCARDIAL INFARCTION, HX OF 07/20/2006   Qualifier: Diagnosis of  By: Leanne Chang MD, Bruce      Past Surgical History:  Procedure Laterality Date  . CATARACT EXTRACTION     Bil  . COLONOSCOPY    . EYE SURGERY    . LUMBAR LAMINECTOMY/DECOMPRESSION MICRODISCECTOMY Left 01/20/2018   Procedure: Left Lumbar five Sacral one extraforaminal decompression;  Surgeon: Eustace Moore, MD;  Location: [place];  Service: Neurosurgery;  Laterality: Left;  . PTCA      There were no vitals filed for this visit.  Subjective Assessment - 03/20/18 1543    Subjective  Patient worked out 2 days last wek walking and doing other exercises but thn had increased pain. He continues to have increased pain when he first stands and walks. He is not sure if he is doing too much with his HEp. Therapy will assess HEP.     Diagnostic tests  Nothing post op     Patient Stated Goals  to have less pain in his back and be able to move better     Currently in Pain?   No/denies   mostly pain with activity                       [facility] Adult PT Treatment/Exercise - 03/21/18 0001      Self-Care   Self-Care  Other Self-Care Comments    Other Self-Care Comments   reviewed the patients home exercise program and made suggestions to reduce pain       Lumbar Exercises: Stretches   Passive Hamstring Stretch  3 reps;30 seconds    Piriformis Stretch  3 reps;10 seconds    Other Lumbar Stretch Exercise  prayer stretch 3x20 sec hold; lateral prayer stretch 3x20 sec hold. Also reviewed how to do at the sink      Lumbar Exercises: Aerobic   Elliptical  5 min no increase in pain       Manual Therapy   Manual Therapy  Joint mobilization;Soft tissue mobilization;Manual Traction    Joint Mobilization  PA glide of the left hip;     Soft tissue mobilization  to lumbar spine uising IASTYM and manual soft tissue mobilization/ trigger poin release to quadratus;     Manual Traction  LAD 3x30 [place] hold L3;  PT Education - 03/20/18 1553    Education Details  reviewed HEP, symptom management     Person(s) Educated  Patient    Methods  Explanation;Demonstration;Tactile cues;Verbal cues    Comprehension  Verbalized understanding;Verbal cues required;Returned demonstration;Tactile cues required       PT Short Term Goals - 03/10/18 0656      PT SHORT TERM GOAL #1   Title  Patient will demonstrate 4+/5 ankle dorsi flexion     Time  4    Period  Weeks    Status  On-going      PT SHORT TERM GOAL #2   Title  Patient will report 3/10 pain at worst with gait     Baseline  difficult to get an acurate pain level from the patient     Time  4    Period  Weeks    Status  On-going      PT SHORT TERM GOAL #3   Title  Patient will be independent with inital HEP     Time  4    Period  Weeks    Status  On-going        PT Long Term Goals - 02/20/18 1648      PT LONG TERM GOAL #1   Title  Patient will walk 3000' without increased pain      Time  8    Status  New      PT LONG TERM GOAL #2   Title  Patient will demonstrate a 51% limitation on FOTO     Time  8    Period  Weeks    Status  New    Target Date  04/17/18      PT LONG TERM GOAL #3   Title  He will improve DF to 4-/5 strength to improve gait stability    Time  8    Period  Weeks    Status  New            Plan - 03/20/18 1555    Clinical Impression Statement  Therapy advised to keep his exercise program to 3 stretches and 3 exercises. He was also advised to continue walking. He had been doing the elliptical backwards which is likley giving him a lot of extension. He is concerned that he is not improving. He was adivsed that this will take time and to look for small gais. He also has been walking and marching his left leg. He had increased quadratus spasming today. This could be the cause. Therapy reviewed prayer stretch and lateral prayer stretch. He scheduled 2 more visits. Therapy will re-assess next visit.     Clinical Presentation  Evolving    Clinical Presentation due to:  pain that increased with ambualtion     Clinical Decision Making  Moderate    PT Frequency  2x / week    PT Duration  8 weeks    PT Treatment/Interventions  ADLs/Self Care Home Management;Cryotherapy;Electrical Stimulation;Iontophoresis 4mg /ml Dexamethasone;Ultrasound;Gait training;Stair training;Functional mobility training;Therapeutic activities;Therapeutic exercise;Neuromuscular re-education;Patient/family education;Manual techniques;Passive range of motion;Taping;Energy conservation    PT Next Visit Plan  consider manual therapy but be aware there is astill a scab; add active DF strengtrhening; give core exercises; consder clam shell; ball squeeze; bridging if tolerated; consider standing hip exercises;     PT Home Exercise Plan  posterior pelvic tilt; supine march; piriformis stretch; Seated hamstring stretch with cuing for hip hinge        Patient will benefit from skilled  therapeutic intervention in order to improve the following deficits and impairments:  Pain, Postural dysfunction, Decreased activity tolerance, Decreased endurance, Decreased strength, Decreased range of motion, Decreased scar mobility, Difficulty walking  Visit Diagnosis: Chronic left-sided low back pain without sciatica  Muscle weakness (generalized)  Abnormal posture     Problem List Patient Active Problem List   Diagnosis Date Noted  . S/P lumbar laminectomy 01/20/2018  . Essential tremor 11/02/2017  . Unstable gait 11/02/2017  . Lumbar radiculopathy 07/19/2017  . Peripheral neuropathy 12/06/2016  . Hx of adenomatous colonic polyps 05/22/2014  . Exertional angina ([place]) 09/28/2011  . Left inguinal hernia 09/10/2011  . PSA, INCREASED 08/05/2009  . CONGENITAL PIGMENTARY ANOMALY OF SKIN 07/25/2007  . Dyslipidemia 07/20/2006  . Coronary atherosclerosis 07/20/2006  . BPH associated with nocturia 07/20/2006    Carney Living 03/21/2018, 2:38 PM  [facility] [address] [place], [place], [address] Phone: [phone]   Fax:  [phone]  Name: Bhavik Cabiness Fitting MRN: [id number] Date of Birth: 1940/11/20

## 2018-03-27 ENCOUNTER — Encounter: Payer: Self-pay | Admitting: Family Medicine

## 2018-03-29 ENCOUNTER — Ambulatory Visit: Payer: PPO | Attending: Neurological Surgery | Admitting: Physical Therapy

## 2018-03-29 ENCOUNTER — Encounter: Payer: Self-pay | Admitting: Physical Therapy

## 2018-03-29 DIAGNOSIS — M6281 Muscle weakness (generalized): Secondary | ICD-10-CM | POA: Insufficient documentation

## 2018-03-29 DIAGNOSIS — R293 Abnormal posture: Secondary | ICD-10-CM | POA: Insufficient documentation

## 2018-03-29 DIAGNOSIS — M545 Low back pain, unspecified: Secondary | ICD-10-CM

## 2018-03-29 DIAGNOSIS — G8929 Other chronic pain: Secondary | ICD-10-CM | POA: Diagnosis not present

## 2018-03-30 ENCOUNTER — Encounter: Payer: Self-pay | Admitting: Physical Therapy

## 2018-03-30 NOTE — Therapy (Signed)
[place], [place], [address] Phone: [phone]   Fax:  [phone]  Physical Therapy Treatment  Patient Details  Name: Collin [name] MRN: [id number] Date of Birth: 11-Jan-1941 Referring Provider (PT): Dr Sherley Bounds    Encounter Date: 03/29/2018  PT End of Session - 03/29/18 1336    Visit Number  6    Number of Visits  16    Date for PT Re-Evaluation  04/17/18    Authorization Type  [employer]     PT Start Time  1330    PT Stop Time  1414    PT Time Calculation (min)  44 min    Activity Tolerance  Patient tolerated treatment well    Behavior During Therapy  [facility] for tasks assessed/performed       Past Medical History:  Diagnosis Date  . Allergy   . Arthritis   . BPH (benign prostatic hyperplasia)   . CAD (coronary artery disease)   . Cataract   . Diverticulosis of colon   . Hx of adenomatous colonic polyps 05/22/2014  . Hyperlipidemia   . Hypertension   . MYOCARDIAL INFARCTION, HX OF 07/20/2006   Qualifier: Diagnosis of  By: Leanne Chang MD, Bruce      Past Surgical History:  Procedure Laterality Date  . CATARACT EXTRACTION     Bil  . COLONOSCOPY    . EYE SURGERY    . LUMBAR LAMINECTOMY/DECOMPRESSION MICRODISCECTOMY Left 01/20/2018   Procedure: Left Lumbar five Sacral one extraforaminal decompression;  Surgeon: Eustace Moore, MD;  Location: [place];  Service: Neurosurgery;  Laterality: Left;  . PTCA      There were no vitals filed for this visit.  Subjective Assessment - 03/29/18 1328    Subjective  Patient feels like the pain has improved at times but it seems to come back. is morning when he woke up he didnt feel anything. After sitting for a bit he sittfened up and had the pulling and pain in his back.     Diagnostic tests  Nothing post op     Patient Stated Goals  to have less pain in his back and be able to move better     Currently in Pain?  No/denies                        [facility] Adult PT Treatment/Exercise - 03/30/18 0001      Lumbar Exercises: Stretches   Passive Hamstring Stretch  3 reps;30 seconds    Piriformis Stretch  3 reps;10 seconds    Other Lumbar Stretch Exercise  prayer stretch 3x20 sec hold; lateral prayer stretch 3x20 sec hold. Also reviewed how to do at the sink      Lumbar Exercises: Aerobic   Elliptical  5 min no increase in pain       Lumbar Exercises: Machines for Strengthening   Cybex Knee Extension  2x15 20 lbs     Cybex Knee Flexion  2x10 20 lbs     Leg Press  2x10 40 lbs     Other Lumbar Machine Exercise  row x10 each handle 2x10 20 lbs     Other Lumbar Machine Exercise  pull down 2x10 20 lbs       Manual Therapy   Soft tissue mobilization  to lumbar spine uising IASTYM and manual soft tissue mobilization/ trigger poin release to quadratus;     Manual Traction  LAD 3x30  [place] hold L3;              PT Education - 03/29/18 1334    Education Details  HEP, symptom mamgment     Person(s) Educated  Patient    Methods  Explanation;Demonstration;Verbal cues;Tactile cues    Comprehension  Verbalized understanding;Returned demonstration;Verbal cues required;Tactile cues required       PT Short Term Goals - 03/10/18 0656      PT SHORT TERM GOAL #1   Title  Patient will demonstrate 4+/5 ankle dorsi flexion     Time  4    Period  Weeks    Status  On-going      PT SHORT TERM GOAL #2   Title  Patient will report 3/10 pain at worst with gait     Baseline  difficult to get an acurate pain level from the patient     Time  4    Period  Weeks    Status  On-going      PT SHORT TERM GOAL #3   Title  Patient will be independent with inital HEP     Time  4    Period  Weeks    Status  On-going        PT Long Term Goals - 02/20/18 1648      PT LONG TERM GOAL #1   Title  Patient will walk 3000' without increased pain     Time  8    Status  New      PT LONG TERM GOAL #2   Title  Patient  will demonstrate a 51% limitation on FOTO     Time  8    Period  Weeks    Status  New    Target Date  04/17/18      PT LONG TERM GOAL #3   Title  He will improve DF to 4-/5 strength to improve gait stability    Time  8    Period  Weeks    Status  New            Plan - 03/29/18 1601    Clinical Impression Statement  Therapy reviewed gym exercises with the patient. He reported some pain after standing up from the exercises but otherwise tolerated exercises well. He continues to have the same problem. When he stands he has a quxik jolt of pain. When he does too much he has soreness in his back the next day. He was advised to continue with his exercises and use his stretches and tennis ball manual therapy if his pain returns. Therapy will assess tolerance to new exercises next visit.      Clinical Decision Making  Moderate    Rehab Potential  Good    PT Frequency  2x / week    PT Duration  8 weeks    PT Treatment/Interventions  ADLs/Self Care Home Management;Cryotherapy;Electrical Stimulation;Iontophoresis 4mg /ml Dexamethasone;Ultrasound;Gait training;Stair training;Functional mobility training;Therapeutic activities;Therapeutic exercise;Neuromuscular re-education;Patient/family education;Manual techniques;Passive range of motion;Taping;Energy conservation    PT Next Visit Plan  assess gym exercises; consdier quadruped exercises.     PT Home Exercise Plan  posterior pelvic tilt; supine march; piriformis stretch; Seated hamstring stretch with cuing for hip hinge     Consulted and Agree with Plan of Care  Patient       Patient will benefit from skilled therapeutic intervention in order to improve the following deficits and impairments:  Pain, Postural dysfunction, Decreased activity tolerance, Decreased endurance, Decreased strength, Decreased range  of motion, Decreased scar mobility, Difficulty walking  Visit Diagnosis: Chronic left-sided low back pain without sciatica  Muscle  weakness (generalized)  Abnormal posture     Problem List Patient Active Problem List   Diagnosis Date Noted  . S/P lumbar laminectomy 01/20/2018  . Essential tremor 11/02/2017  . Unstable gait 11/02/2017  . Lumbar radiculopathy 07/19/2017  . Peripheral neuropathy 12/06/2016  . Hx of adenomatous colonic polyps 05/22/2014  . Exertional angina ([place]) 09/28/2011  . Left inguinal hernia 09/10/2011  . PSA, INCREASED 08/05/2009  . CONGENITAL PIGMENTARY ANOMALY OF SKIN 07/25/2007  . Dyslipidemia 07/20/2006  . Coronary atherosclerosis 07/20/2006  . BPH associated with nocturia 07/20/2006    Carney Living PT DPT  03/30/2018, 9:59 AM  [place], [place], [address] Phone: [phone]   Fax:  [phone]  Name: Pawel Soules Braley MRN: [id number] Date of Birth: 11-May-1940

## 2018-04-03 ENCOUNTER — Ambulatory Visit: Payer: PPO | Admitting: Physical Therapy

## 2018-04-03 DIAGNOSIS — M545 Low back pain: Principal | ICD-10-CM

## 2018-04-03 DIAGNOSIS — R293 Abnormal posture: Secondary | ICD-10-CM

## 2018-04-03 DIAGNOSIS — G8929 Other chronic pain: Secondary | ICD-10-CM

## 2018-04-03 DIAGNOSIS — M6281 Muscle weakness (generalized): Secondary | ICD-10-CM

## 2018-04-03 NOTE — Therapy (Addendum)
[name], [place], [address] Phone: [phone]   Fax:  [phone]  Physical Therapy Treatment/Discharge   Patient Details  Name: Collin [name] MRN: [id number] Date of Birth: May 12, 1940 Referring Provider (Collin): Collin [name]    Encounter Date: 04/03/2018    Past Medical History:  Diagnosis Date  . Allergy   . Arthritis   . BPH (benign prostatic hyperplasia)   . CAD (coronary artery disease)   . Cataract   . Diverticulosis of colon   . Hx of adenomatous colonic polyps 05/22/2014  . Hyperlipidemia   . Hypertension   . MYOCARDIAL INFARCTION, HX OF 07/20/2006   Qualifier: Diagnosis of  By: Collin Chang MD, Collin [name]      Past Surgical History:  Procedure Laterality Date  . CATARACT EXTRACTION     Bil  . COLONOSCOPY    . EYE SURGERY    . LUMBAR LAMINECTOMY/DECOMPRESSION MICRODISCECTOMY Left 01/20/2018   Procedure: Left Lumbar five Sacral one extraforaminal decompression;  Surgeon: Collin Moore, MD;  Location: [name];  Service: Neurosurgery;  Laterality: Left;  . PTCA      There were no vitals filed for this visit.                    [name] Adult Collin Treatment/Exercise - 04/03/18 0001      Self-Care   Other Self-Care Comments   reviewed return to [facility]lin [name]. Patient advised to go hit a bucket of balls with his chipper and high irons.       Lumbar Exercises: Stretches   Other Lumbar Stretch Exercise  Patient reports pain with LTR. Therapy streongly advised him not to do it.       Manual Therapy   Joint Mobilization  PA glide of the left hip; Grade II and III lumbar PA and side lying lumbar lateral glide    Soft tissue mobilization  to lumbar spine uising IASTYM and manual soft tissue mobilization/ trigger poin release to quadratus;     Manual Traction  LAD 3x30 [place]lin [name] hold L3;                Collin Short Term Goals - 04/03/18 1625      Collin SHORT TERM GOAL #1   Title  Patient  will demonstrate 4+/5 ankle dorsi flexion     Time  4    Period  Weeks    Status  On-going    Target Date  03/20/18      Collin SHORT TERM GOAL #2   Title  Patient will report 3/10 pain at worst with gait     Baseline  difficult to get an acurate pain level from the patient     Time  4    Period  Weeks    Status  On-going      Collin SHORT TERM GOAL #3   Title  Patient will be independent with inital HEP     Time  4    Period  Weeks    Status  On-going        Collin Long Term Goals - 02/20/18 1648      Collin LONG TERM GOAL #1   Title  Patient will walk 3000' without increased pain     Time  8    Status  New      Collin LONG TERM GOAL #2   Title  Patient will demonstrate a 51% limitation on FOTO  Time  8    Period  Weeks    Status  New    Target Date  04/17/18      Collin LONG TERM GOAL #3   Title  He will improve DF to 4-/5 strength to improve gait stability    Time  8    Period  Weeks    Status  New            Plan - 04/03/18 1500    Clinical Impression Statement  Therapy continue to work on lumbar mobilization and hip mobilization. The patient feels a pain in his hip when he first stands and turns. He would like to do sit-ups but therapy advised against that. He really would benefit most from neutral core stabilization but the patient really wantedto do some type of crunch. Therapy showed him how to perform cruntches without moving his spine. He reports unepected resposes to manual therapy. For instance her reported pressue and feeling in his naterior hip with  grade 4 posterior hip mobilization and he reported feeling it in his calf 1x but not during the other mobilizations. Therapy will continue to work on core stability while the patient works on his exercises. He was advised as long as he dosent feel pain for several days to continue strengthening.     Stability/Clinical Decision Making  Collin [name]    Rehab Potential  Good    Collin Frequency  2x / week    Collin  Treatment/Interventions  ADLs/Self Care Home Management;Cryotherapy;Electrical Stimulation;Iontophoresis 33m/ml Dexamethasone;Ultrasound;Gait training;Stair training;Functional mobility training;Therapeutic activities;Therapeutic exercise;Neuromuscular re-education;Patient/family education;Manual techniques;Passive range of motion;Taping;Energy conservation    Collin Next Visit Plan  consider ankle strengthening     Collin Home Exercise Plan  posterior pelvic tilt; supine march; piriformis stretch; Seated hamstring stretch with cuing for hip hinge     Consulted and Agree with Plan of Care  Patient       Patient will benefit from skilled therapeutic intervention in order to improve the following deficits and impairments:  Pain, Postural dysfunction, Decreased activity tolerance, Decreased endurance, Decreased strength, Decreased range of motion, Decreased scar mobility, Difficulty walking  Visit Diagnosis: Chronic left-sided low back pain without sciatica  Muscle weakness (generalized)  Abnormal posture        Problem List Patient Active Problem List   Diagnosis Date Noted  . S/P lumbar laminectomy 01/20/2018  . Essential tremor 11/02/2017  . Unstable gait 11/02/2017  . Lumbar radiculopathy 07/19/2017  . Peripheral neuropathy 12/06/2016  . Hx of adenomatous colonic polyps 05/22/2014  . Exertional angina ([place] 09/28/2011  . Left inguinal hernia 09/10/2011  . PSA, INCREASED 08/05/2009  . CONGENITAL PIGMENTARY ANOMALY OF SKIN 07/25/2007  . Dyslipidemia 07/20/2006  . Coronary atherosclerosis 07/20/2006  . BPH associated with nocturia 07/20/2006     PHYSICAL THERAPY DISCHARGE SUMMARY  Visits from Start of Care: 6  Current functional level related to goals / functional outcomes: Improved pain    Remaining deficits: Still has pain when he stands for too long   Education / Equipment: HEP   Plan: Patient agrees to discharge.  Patient goals were not met. Patient is being discharged  due to not returning since the last visit.  ?????      DCarney LivingPT [name]  04/03/2018, 4:28 PM  [facility] [place] [address]Phone: [phone]  Fax:  [phone] Name: [name] MRN: [id number] of Birth: [birth year]

## 2018-04-12 ENCOUNTER — Encounter: Payer: PPO | Admitting: Physical Therapy

## 2018-04-18 ENCOUNTER — Encounter: Payer: PPO | Admitting: Physical Therapy

## 2018-05-25 ENCOUNTER — Telehealth: Payer: Self-pay | Admitting: Family Medicine

## 2018-05-25 NOTE — Telephone Encounter (Signed)
Copied from [place] [id number]. Topic: Appointment Scheduling - Scheduling Inquiry for Clinic >> May 25, 2018  4:06 PM Blase Mess A wrote: Reason for CRM Patient is calling to schedule his CPE CB- [phone]-

## 2018-05-26 ENCOUNTER — Other Ambulatory Visit: Payer: Self-pay | Admitting: *Deleted

## 2018-05-26 DIAGNOSIS — R351 Nocturia: Principal | ICD-10-CM

## 2018-05-26 DIAGNOSIS — E785 Hyperlipidemia, unspecified: Secondary | ICD-10-CM

## 2018-05-26 DIAGNOSIS — N401 Enlarged prostate with lower urinary tract symptoms: Secondary | ICD-10-CM

## 2018-05-26 DIAGNOSIS — I2510 Atherosclerotic heart disease of native coronary artery without angina pectoris: Secondary | ICD-10-CM

## 2018-05-26 NOTE — Telephone Encounter (Signed)
Patient informed that it was okay to come in a week before appointment per Dr. Martinique. Lab appointment made and order's placed. Nothing further needed at this time.

## 2018-05-26 NOTE — Telephone Encounter (Signed)
He can have CMP and FLP. If he is interested, PSA and UA.  Explained that we cannot use screening diagnosis, most likely they are not going to be covered by insurance.  So we need to use diagnosis: BPH,dyslipidemia,and CAD.  Thanks, BJ

## 2018-05-26 NOTE — Telephone Encounter (Signed)
I scheduled the patient's appt for his physical in October, however he wants a call back about making a lab appt.  He wants to have his labs done 1 week prior to his appt.  I advised him that we don't do that but he wanted me to leave a message to do so.

## 2018-05-26 NOTE — Telephone Encounter (Signed)
Is it okay to schedule labs 1 week before appointment?

## 2018-10-23 ENCOUNTER — Other Ambulatory Visit: Payer: Self-pay

## 2018-10-23 DIAGNOSIS — R6889 Other general symptoms and signs: Secondary | ICD-10-CM | POA: Diagnosis not present

## 2018-10-23 DIAGNOSIS — Z20822 Contact with and (suspected) exposure to covid-19: Secondary | ICD-10-CM

## 2018-10-24 LAB — NOVEL CORONAVIRUS, NAA: SARS-CoV-2, NAA: NOT DETECTED

## 2018-10-30 ENCOUNTER — Other Ambulatory Visit: Payer: Self-pay

## 2018-10-30 ENCOUNTER — Ambulatory Visit (INDEPENDENT_AMBULATORY_CARE_PROVIDER_SITE_OTHER): Payer: PPO | Admitting: Internal Medicine

## 2018-10-30 ENCOUNTER — Encounter: Payer: Self-pay | Admitting: Internal Medicine

## 2018-10-30 ENCOUNTER — Telehealth: Payer: Self-pay

## 2018-10-30 DIAGNOSIS — R6884 Jaw pain: Secondary | ICD-10-CM

## 2018-10-30 NOTE — Progress Notes (Signed)
Virtual Visit via Telephone Note  I connected with@ on 10/30/18 at  2:30 PM EDT by telephone and verified that I am speaking with the correct person using two identifiers.   I discussed the limitations, risks, security and privacy concerns of performing an evaluation and management service by telephone and the availability of in person appointments. I also discussed with the patient that there may be a patient responsible charge related to this service. The patient expressed understanding and agreed to proceed.  Original appointment was supposed to be in person.  Location patient: home Location provider: work office Participants present for the call: patient, provider Patient did not have a visit in the prior 7 days to address this/these issue(s).   History of Present Illness: Collin [name] presents today originally supposed to be a in person visit but since he was recently tested for COVID even though negative by protocol has to be virtual or telephone.  He stated about a month ago he noticed some mild some difficulty with his right jaw pain and then 2 weeks ago it got worse.  It hurts to chew and felt sore he then developed a mild sore throat and ear discomfort feeling like he could not pop your ears going up and down on an airplane.  It was difficult to lay on that side when he sleep and had increased symptoms at that time.  There was no associated fever and chills. No history of trauma unusual headaches or history of same. Last dental check was December 2019. He has taken 500 mg of Tylenol but does not need pain medicine today. He has no difficulty swallowing it is only when he bites down chews or opens his mouth wide.  And there is no unusual rash.   Observations/Objective: Patient sounds cheerful and well on the phone. I do not appreciate any SOB. He is mildly hoarse  No cough  Speech and thought processing are grossly intact. Patient reported vitals:  Assessment and Plan: Jaw  pain right prob TMJ   Appears to be mechanical jaw joint pain by detailed history.  The pressures ears may be secondary although persistent exam is indicated. It does not seem like vascular claudication or other acute finding of dental infection  He has a follow-up appointment  Follow Up Instructions:  softs diet relative joint rest   See dentist   Has fu cpx with dr Martinique  Think the ear pressure if not an ear issue   But exam needed if  persistent or progressive  Could use tylenol if needed or topical voltaren gel  He seems to think getting better but  Will need fu  Esp if  persistent or progressive     He will be 14 days out from covid testing next week .  And should be able to come for his physical and exam unless new symptoms occur.   [id number] 5-10 [id number] 11-20 9443 21-30 I did not refer this patient for an OV in the next 24 hours for this/these issue(s).  I discussed the assessment and treatment plan with the patient. The patient was provided an opportunity to ask questions and all were answered. The patient agreed with the plan and demonstrated an understanding of the instructions.   The patient was advised to call back or seek an in-person evaluation if the symptoms worsen or if the condition fails to improve as anticipated.  I provided 15 minutes of non-face-to-face time during this encounter.   Shanon Ace, MD

## 2018-10-30 NOTE — Telephone Encounter (Signed)
lvm for pt to call back since he had a covid test 7 days ago

## 2018-10-31 ENCOUNTER — Other Ambulatory Visit: Payer: PPO

## 2018-11-06 ENCOUNTER — Other Ambulatory Visit (INDEPENDENT_AMBULATORY_CARE_PROVIDER_SITE_OTHER): Payer: PPO

## 2018-11-06 ENCOUNTER — Other Ambulatory Visit: Payer: Self-pay

## 2018-11-06 DIAGNOSIS — N401 Enlarged prostate with lower urinary tract symptoms: Secondary | ICD-10-CM

## 2018-11-06 DIAGNOSIS — R351 Nocturia: Secondary | ICD-10-CM | POA: Diagnosis not present

## 2018-11-06 DIAGNOSIS — I2510 Atherosclerotic heart disease of native coronary artery without angina pectoris: Secondary | ICD-10-CM

## 2018-11-06 DIAGNOSIS — E785 Hyperlipidemia, unspecified: Secondary | ICD-10-CM | POA: Diagnosis not present

## 2018-11-06 LAB — COMPREHENSIVE METABOLIC PANEL
ALT: 23 U/L (ref 0–53)
AST: 28 U/L (ref 0–37)
Albumin: 4.3 g/dL (ref 3.5–5.2)
Alkaline Phosphatase: 51 U/L (ref 39–117)
BUN: 16 mg/dL (ref 6–23)
CO2: 30 mEq/L (ref 19–32)
Calcium: 9.6 mg/dL (ref 8.4–10.5)
Chloride: 103 mEq/L (ref 96–112)
Creatinine, Ser: 1.15 mg/dL (ref 0.40–1.50)
GFR: 61.14 mL/min (ref >60.00)
Glucose, Bld: 105 mg/dL — ABNORMAL HIGH (ref 70–99)
Potassium: 4.9 mEq/L (ref 3.5–5.1)
Sodium: 140 mEq/L (ref 135–145)
Total Bilirubin: 0.9 mg/dL (ref 0.2–1.2)
Total Protein: 6.7 g/dL (ref 6.0–8.3)

## 2018-11-06 LAB — URINALYSIS
Bilirubin Urine: NEGATIVE
Hgb urine dipstick: NEGATIVE
Ketones, ur: NEGATIVE
Leukocytes,Ua: NEGATIVE
Nitrite: NEGATIVE
Specific Gravity, Urine: 1.02 (ref 1.000–1.030)
Total Protein, Urine: NEGATIVE
Urine Glucose: NEGATIVE
Urobilinogen, UA: 0.2 (ref 0.0–1.0)
pH: 6 (ref 5.0–8.0)

## 2018-11-06 LAB — LIPID PANEL
Cholesterol: 134 mg/dL (ref 0–200)
HDL: 57.8 mg/dL (ref >39.00)
LDL Cholesterol: 62 mg/dL (ref 0–99)
NonHDL: 75.77
Total CHOL/HDL Ratio: 2
Triglycerides: 70 mg/dL (ref 0.0–149.0)
VLDL: 14 mg/dL (ref 0.0–40.0)

## 2018-11-07 ENCOUNTER — Ambulatory Visit (INDEPENDENT_AMBULATORY_CARE_PROVIDER_SITE_OTHER): Payer: PPO | Admitting: Family Medicine

## 2018-11-07 ENCOUNTER — Other Ambulatory Visit: Payer: Self-pay

## 2018-11-07 ENCOUNTER — Encounter: Payer: Self-pay | Admitting: Family Medicine

## 2018-11-07 VITALS — BP 122/60 | HR 66 | Temp 98.1°F | Resp 12 | Ht 73.0 in | Wt 194.2 lb

## 2018-11-07 DIAGNOSIS — Z0000 Encounter for general adult medical examination without abnormal findings: Secondary | ICD-10-CM

## 2018-11-07 DIAGNOSIS — E785 Hyperlipidemia, unspecified: Secondary | ICD-10-CM

## 2018-11-07 DIAGNOSIS — M26621 Arthralgia of right temporomandibular joint: Secondary | ICD-10-CM

## 2018-11-07 DIAGNOSIS — I2510 Atherosclerotic heart disease of native coronary artery without angina pectoris: Secondary | ICD-10-CM

## 2018-11-07 NOTE — Patient Instructions (Addendum)
Collin [name] , Thank you for taking time to come for your Medicare Wellness Visit. I appreciate your ongoing commitment to your health goals. Please review the following plan we discussed and let me know if I can assist you in the future.   These are the goals we discussed: Goals   None     This is a list of the screening recommended for you and due dates:  Health Maintenance  Topic Date Due  . Flu Shot  08/26/2018  . Colon Cancer Screening  11/16/2019*  . Pneumonia vaccines  Completed  . Tetanus Vaccine  Discontinued  *Topic was postponed. The date shown is not the original due date.   A few tips:  -As we age balance is not as good as it was, so there is a higher risks for falls. Please remove small rugs and furniture that is "in your way" and could increase the risk of falls. Stretching exercises may help with fall prevention: Yoga and Tai Chi are some examples. Low impact exercise is better, so you are not very achy the next day.  -Sun screen and avoidance of direct sun light recommended. Caution with dehydration, if working outdoors be sure to drink enough fluids.  - Some medications are not safe as we age, increases the risk of side effects and can potentially interact with other medication you are also taken;  including some of over the counter medications. Be sure to let me know when you start a new medication even if it is a dietary/vitamin supplement.   -Healthy diet low in red meet/animal fat and sugar + regular physical activity is recommended.     A few things to remember from today's visit:   Dyslipidemia  Atherosclerosis of native coronary artery of native heart without angina pectoris  Routine general medical examination at a health care facility  Arthralgia of right temporomandibular joint   Temporomandibular Joint Syndrome  Temporomandibular joint syndrome (TMJ syndrome) is a condition that causes pain in the temporomandibular joints. These joints are  located near your ears and allow your jaw to open and close. For people with TMJ syndrome, chewing, biting, or other movements of the jaw can be difficult or painful. TMJ syndrome is often mild and goes away within a few weeks. However, sometimes the condition becomes a long-term (chronic) problem. What are the causes? This condition may be caused by:  Grinding your teeth or clenching your jaw. Some people do this when they are under stress.  Arthritis.  Injury to the jaw.  Head or neck injury.  Teeth or dentures that are not aligned well. In some cases, the cause of TMJ syndrome may not be known. What are the signs or symptoms? The most common symptom of this condition is an aching pain on the side of the head in the area of the TMJ. Other symptoms may include:  Pain when moving your jaw, such as when chewing or biting.  Being unable to open your jaw all the way.  Making a clicking sound when you open your mouth.  Headache.  Earache.  Neck or shoulder pain. How is this diagnosed? This condition may be diagnosed based on:  Your symptoms and medical history.  A physical exam. Your health care provider may check the range of motion of your jaw.  Imaging tests, such as X-rays or an MRI. You may also need to see your dentist, who will determine if your teeth and jaw are lined up correctly. How is this  treated? TMJ syndrome often goes away on its own. If treatment is needed, the options may include:  Eating soft foods and applying ice or heat.  Medicines to relieve pain or inflammation.  Medicines or massage to relax the muscles.  A splint, bite plate, or mouthpiece to prevent teeth grinding or jaw clenching.  Relaxation techniques or counseling to help reduce stress.  A therapy for pain in which an electrical current is applied to the nerves through the skin (transcutaneous electrical nerve stimulation).  Acupuncture. This is sometimes helpful to relieve pain.  Jaw  surgery. This is rarely needed. Follow these instructions at home:  Eating and drinking  Eat a soft diet if you are having trouble chewing.  Avoid foods that require a lot of chewing. Do not chew gum. General instructions  Take over-the-counter and prescription medicines only as told by your health care provider.  If directed, put ice on the painful area. ? Put ice in a plastic bag. ? Place a towel between your skin and the bag. ? Leave the ice on for 20 minutes, 2-3 times a day.  Apply a warm, wet cloth (warm compress) to the painful area as directed.  Massage your jaw area and do any jaw stretching exercises as told by your health care provider.  If you were given a splint, bite plate, or mouthpiece, wear it as told by your health care provider.  Keep all follow-up visits as told by your health care provider. This is important. Contact a health care provider if:  You are having trouble eating.  You have new or worsening symptoms. Get help right away if:  Your jaw locks open or closed. Summary  Temporomandibular joint syndrome (TMJ syndrome) is a condition that causes pain in the temporomandibular joints. These joints are located near your ears and allow your jaw to open and close.  TMJ syndrome is often mild and goes away within a few weeks. However, sometimes the condition becomes a long-term (chronic) problem.  Symptoms include an aching pain on the side of the head in the area of the TMJ, pain when chewing or biting, and being unable to open your jaw all the way. You may also make a clicking sound when you open your mouth.  TMJ syndrome often goes away on its own. If treatment is needed, it may include medicines to relieve pain, reduce inflammation, or relax the muscles. A splint, bite plate, or mouthpiece may also be used to prevent teeth grinding or jaw clenching. This information is not intended to replace advice given to you by your health care provider. Make sure  you discuss any questions you have with your health care provider. Document Released: 10/06/2000 Document Revised: 03/25/2017 Document Reviewed: 02/22/2017 Elsevier Patient Education  [employer].  Please be sure medication list is accurate. If a new problem present, please set up appointment sooner than planned today.

## 2018-11-07 NOTE — Progress Notes (Signed)
HPI:  [name] is a 78 y.o.male here today for his routine physical examination.  Last CPE: 11/02/2017. Last AWV 10/19/16.   Since his last visit he underwent lumbar laminectomy (12/2018) and following with neurosurgeon, completed PT.  He lives with his wife.  Regular exercise 3 or more times per week: He walks, lifts wts,and plays golf. Following a healthy diet: Yes.  Independent ADL's and IADL's. Mildly unstable gait,he does not use any assistance.  No falls in the past year and denies depression symptoms.  Functional Status Survey: Is the patient deaf or have difficulty hearing?: Yes(Sometimes) Does the patient have difficulty seeing, even when wearing glasses/contacts?: No Does the patient have difficulty concentrating, remembering, or making decisions?: No Does the patient have difficulty walking or climbing stairs?: No Does the patient have difficulty dressing or bathing?: No Does the patient have difficulty doing errands alone such as visiting a doctor's office or shopping?: No  Fall Risk  11/07/2018 11/02/2017 01/26/2017 10/14/2015 10/08/2014  Falls in the past year? 0 No No No No  Number falls in past yr: 0 - - - -  Injury with Fall? 0 - - - -  Risk for fall due to : Impaired balance/gait;Orthopedic patient - - - -  Follow up Education provided - - - -    Providers he sees regularly: Eye care provider: Dr Valetta Close, last seen 01/2018. Cardiologist, Dr Burt Knack. Neurosurgeon: Dr Ronnald Ramp. Dermatologist, Dr Delman Cheadle   Depression screen [facility] 2/9 11/07/2018  Decreased Interest 0  Down, Depressed, Hopeless 0  PHQ - 2 Score 0   Mini-Cog - 11/07/18 1921    Normal clock drawing test?  yes    How many words correct?  3       Hearing Screening   125Hz  250Hz  500Hz  1000Hz  2000Hz  3000Hz  4000Hz  6000Hz  8000Hz   Right ear:           Left ear:             Visual Acuity Screening   Right eye Left eye Both eyes  Without correction:     With correction: 20/30 20/30  20/25    Chronic medical problems: BPH with nocturia, HLD,polyneuropathy,lower back pain,and essential tremor among some. BPH with nocturia. Nocturia x 3, stable for years. Flomax did not help.   Immunization History  Administered Date(s) Administered  . Influenza Whole 12/04/2008, 01/20/2010, 07/26/2010  . Influenza, High Dose Seasonal PF 10/16/2015, 11/02/2017  . Influenza,inj,Quad PF,6+ Mos 11/20/2012, 11/20/2013, 10/08/2014  . Influenza-Unspecified 11/17/2016  . Pneumococcal Conjugate-13 11/20/2013  . Pneumococcal Polysaccharide-23 07/20/2006  . Td 07/20/2006  . Zoster 09/09/2010    -Denies high alcohol intake, tobacco use, or Hx of illicit drug use.  -Concerns and/or follow up today:   He was seen recently becasue right TMJ pain, exacerbated by chewing. Problem has improved. He feels like sometimes he is grinding his teeth. He has an appt with his dentist in a few days He had labs recently.  HLD: Currently he is on Simvastatin 40 mg daily. He is tolerating medication well. Follows low fat diet.  Lab Results  Component Value Date   CHOL 134 11/06/2018   HDL 57.80 11/06/2018   LDLCALC 62 11/06/2018   TRIG 70.0 11/06/2018   CHOLHDL 2 11/06/2018   + CAD, follows with cardiologist annually. He is on Metoprolol Succinate 25 mg daily.  Lab Results  Component Value Date   CREATININE 1.15 11/06/2018   BUN 16 11/06/2018   NA 140  11/06/2018   K 4.9 11/06/2018   CL 103 11/06/2018   CO2 30 11/06/2018    Lab Results  Component Value Date   ALT 23 11/06/2018   AST 28 11/06/2018   ALKPHOS 51 11/06/2018   BILITOT 0.9 11/06/2018   Last visit, 11/03/18, he was referred to surgery because left inguinal hernia. He met with surgeon and decided not to pursue surgery at this time, it took long to recover from back surgery in 12/2017.   Review of Systems  Constitutional: Negative for activity change, appetite change, fatigue and fever.  HENT: Negative for dental  problem, nosebleeds, sore throat, trouble swallowing and voice change.   Eyes: Negative for redness and visual disturbance.  Respiratory: Negative for cough, shortness of breath and wheezing.   Cardiovascular: Negative for chest pain, palpitations and leg swelling.  Gastrointestinal: Negative for abdominal pain, blood in stool, nausea and vomiting.  Endocrine: Negative for cold intolerance, heat intolerance, polydipsia, polyphagia and polyuria.  Genitourinary: Negative for decreased urine volume, dysuria, genital sores, hematuria and testicular pain.  Musculoskeletal: Positive for arthralgias and back pain. Negative for myalgias.  Skin: Negative for color change and rash.  Allergic/Immunologic: Positive for environmental allergies.  Neurological: Negative for syncope and headaches.  Hematological: Negative for adenopathy. Does not bruise/bleed easily.  Psychiatric/Behavioral: Negative for confusion. The patient is not nervous/anxious.   All other systems reviewed and are negative.   Current Outpatient Medications on File Prior to Visit  Medication Sig Dispense Refill  . aspirin 81 MG tablet Take 1 tablet (81 mg total) by mouth at bedtime. 30 tablet   . Bioflavonoid Products (GRAPE SEED PO) Take 1 capsule by mouth daily.    . Cholecalciferol (VITAMIN D3) 125 MCG (5000 UT) CAPS Take 5,000 Units by mouth at bedtime.    . CHOLINE PO Take 250 mg by mouth at bedtime.    . Coenzyme Q10 (EQL COQ10) 300 MG CAPS Take 300 mg by mouth daily.    . fish oil-omega-3 fatty acids 1000 MG capsule Take 1 g by mouth daily.     . Glucosamine-Chondroitin (MOVE FREE PO) Take 1 tablet by mouth daily.      . metoprolol succinate (TOPROL-XL) 25 MG 24 hr tablet TAKE 1 TABLET BY MOUTH ONCE DAILY 90 tablet 3  . Multiple Vitamin (MULTIVITAMIN) tablet Take 1 tablet by mouth daily.      . nitroGLYCERIN (NITROSTAT) 0.4 MG SL tablet Place 1 tablet (0.4 mg total) under the tongue every 5 (five) minutes as needed for chest  pain. 25 tablet 3  . pyridOXINE (VITAMIN B-6) 100 MG tablet Take 100 mg by mouth daily.    . Saw Palmetto 450 MG CAPS Take 450 mg by mouth at bedtime.    . simvastatin (ZOCOR) 40 MG tablet TAKE 1 TABLET BY MOUTH ONCE DAILY AT 6PM 90 tablet 3  . vitamin B-12 (CYANOCOBALAMIN) 1000 MCG tablet Take 1,000 mcg by mouth daily.     No current facility-administered medications on file prior to visit.      Past Medical History:  Diagnosis Date  . Allergy   . Arthritis   . BPH (benign prostatic hyperplasia)   . CAD (coronary artery disease)   . Cataract   . Diverticulosis of colon   . Hx of adenomatous colonic polyps 05/22/2014  . Hyperlipidemia   . Hypertension   . MYOCARDIAL INFARCTION, HX OF 07/20/2006   Qualifier: Diagnosis of  By: Leanne Chang MD, Bruce      Past Surgical  History:  Procedure Laterality Date  . CATARACT EXTRACTION     Bil  . COLONOSCOPY    . EYE SURGERY    . LUMBAR LAMINECTOMY/DECOMPRESSION MICRODISCECTOMY Left 01/20/2018   Procedure: Left Lumbar five Sacral one extraforaminal decompression;  Surgeon: Eustace Moore, MD;  Location: [facility];  Service: Neurosurgery;  Laterality: Left;  . PTCA      Allergies  Allergen Reactions  . Penicillins Rash and Other (See Comments)    PATIENT HAS HAD A PCN REACTION WITH IMMEDIATE RASH, FACIAL/TONGUE/THROAT SWELLING, SOB, OR LIGHTHEADEDNESS WITH HYPOTENSION:  #  #  YES  #  #  Has patient had a PCN reaction causing severe rash involving mucus membranes or skin necrosis: No Has patient had a PCN reaction that required hospitalization: No Has patient had a PCN reaction occurring within the last 10 years: No If all of the above answers are "NO", then may proceed with Cephalosporin use.    Family History  Problem Relation Age of Onset  . Heart disease Mother        CABG  . Stroke Father   . Kidney disease Father        renal failure  . AAA (abdominal aortic aneurysm) Father   . Hypertension Sister   . Diabetes Brother   . AAA  (abdominal aortic aneurysm) Paternal Uncle   . Colon cancer Neg Hx     Social History   Socioeconomic History  . Marital status: Married    Spouse name: Not on file  . Number of children: 3  . Years of education: 71  . Highest education level: Not on file  Occupational History  . Occupation: retired  Scientific laboratory technician  . Financial resource strain: Not on file  . Food insecurity    Worry: Not on file    Inability: Not on file  . Transportation needs    Medical: Not on file    Non-medical: Not on file  Tobacco Use  . Smoking status: Never Smoker  . Smokeless tobacco: Never Used  Substance and Sexual Activity  . Alcohol use: Yes    Alcohol/week: 1.0 standard drinks    Types: 1 Glasses of wine per week    Comment: Occasional beer.  . Drug use: No  . Sexual activity: Not on file  Lifestyle  . Physical activity    Days per week: Not on file    Minutes per session: Not on file  . Stress: Not on file  Relationships  . Social Herbalist on phone: Not on file    Gets together: Not on file    Attends religious service: Not on file    Active member of club or organization: Not on file    Attends meetings of clubs or organizations: Not on file    Relationship status: Not on file  Other Topics Concern  . Not on file  Social History Narrative   Lives with wife in a 2 story home.  Has 3 children.     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    Education: college.    Occasional beer, no smoking, no drugs     Vitals:   11/07/18 1029  BP: 122/60  Pulse: 66  Resp: 12  Temp: 98.1 F (36.7 C)  SpO2: 96%   Body mass index is 25.63 kg/m.   Wt Readings from Last 3 Encounters:  11/07/18 194 lb 4 oz (88.1 kg)  01/20/18 191 lb (86.6 kg)  01/09/18 196 lb 9.6 oz (89.2 kg)    Physical Exam  Nursing note and vitals reviewed. Constitutional: He is oriented to person, place, and time. He appears well-developed. No distress.  HENT:  Head: Atraumatic.   Right Ear: Tympanic membrane, external ear and ear canal normal.  Left Ear: Tympanic membrane, external ear and ear canal normal.  Mouth/Throat: Oropharynx is clear and moist and mucous membranes are normal.  Eyes: Pupils are equal, round, and reactive to light. Conjunctivae and EOM are normal.  Neck: Normal range of motion. No tracheal deviation present. No thyromegaly present.  Cardiovascular: Normal rate and regular rhythm.  No murmur heard. Pulses:      Dorsalis pedis pulses are 2+ on the right side and 2+ on the left side.  Respiratory: Effort normal and breath sounds normal. No respiratory distress.  GI: Soft. He exhibits no mass. There is no hepatomegaly. There is no abdominal tenderness. A hernia (umbilical,small.) is present. Hernia confirmed positive in the left inguinal area.    Genitourinary:    Genitourinary Comments: Refused,no concerns.   Musculoskeletal:        General: No tenderness or edema.     Comments: No major deformities appreciated and no signs of synovitis.  Lymphadenopathy:    He has no cervical adenopathy.       Right: No supraclavicular adenopathy present.       Left: No supraclavicular adenopathy present.  Neurological: He is alert and oriented to person, place, and time. No cranial nerve deficit or sensory deficit.  Reflex Scores:      Bicep reflexes are 2+ on the right side and 2+ on the left side.      Patellar reflexes are 2+ on the right side and 2+ on the left side. Unstable gait,not assisted. No focal deficit appreciated.  Skin: Skin is warm. No erythema.  Psychiatric: He has a normal mood and affect. Cognition and memory are normal.  Well groomed,good eye contact.   ASSESSMENT AND PLAN:  Mr. Natanel was seen today for annual exam and medicare wellness.  Diagnoses and all orders for this visit:  Routine general medical examination at a health care facility Preventive guidelines reviewed. Vaccination up to date.  Next CPE in a year.   Dyslipidemia Problem is well controlled. Continue Simvastatin 40 mg daily and low fat diet.  Atherosclerosis of native coronary artery of native heart without angina pectoris Asymptomatic. Continue Metoprolol succinate 25 mg daily, Simvastatin 40,and Aspirin 81 mg daily. Follows with Dr Burt Knack.  Arthralgia of right temporomandibular joint Improved. TMJ synd, OA. General recommendations for TMJ synd. Keep appt with his dentist.  Medicare annual wellness visit, subsequent We discussed the importance of staying active, physically and mentally, as well as the benefits of a healthy/balance diet. Low impact exercise that involve stretching and strengthing are ideal. Vaccines: Annual flu vaccine. We discussed preventive screening for the next 5-10 years, summery of recommendations on AVS. Fall prevention.  Advance directives and end of life discussed, he has POA and living will.    Return in about 1 year (around 11/07/2019) for cpe/awv.    Betty G. Martinique, MD  [facility]. [place] office.

## 2018-11-09 ENCOUNTER — Encounter: Payer: Self-pay | Admitting: Family Medicine

## 2018-11-15 ENCOUNTER — Telehealth: Payer: Self-pay | Admitting: Family Medicine

## 2018-11-15 NOTE — Telephone Encounter (Signed)
Pt called and stated that he is still not better from TMJ. Pt states that he would like a call back because he states that it hurts to swallow. Please advise.

## 2018-11-20 NOTE — Telephone Encounter (Signed)
Please advise 

## 2018-11-21 NOTE — Telephone Encounter (Signed)
Spoke to pt and he stated he went to the dentist. The dentist did not find anything but they prescribed an abx for pt. Pt stated he will take the Rx and call if it does not work.

## 2018-11-21 NOTE — Telephone Encounter (Signed)
Sore throat/pain swallowing is not a common symptom of temporomandibular joint pain.  During recent visit I gave recommendations in regar to [place], including a dental evaluation.  Please go ahead and schedule appt to discuss pain with swallowing.  Thanks, BJ

## 2018-11-23 ENCOUNTER — Telehealth: Payer: Self-pay | Admitting: *Deleted

## 2018-11-23 NOTE — Telephone Encounter (Signed)
Copied from [place] [phone]. Topic: Referral - Request for Referral >> Nov 23, 2018  8:51 AM Scherrie Gerlach wrote: Pt went to the dentist for his TMJ and they recommend he go to an oral surgeon.  He doesn't think he should have to.  Wants to know what Dr Martinique thinks? Does [facility] have anyone that might see him for this?  Pt still having problems with this and wants to get to the bottom of his problem.  Pt aware Dr Martinique out on Thurs, would like call back tomorrow.(Friday)

## 2018-11-24 NOTE — Telephone Encounter (Signed)
Left message to return phone call.

## 2018-11-27 ENCOUNTER — Telehealth: Payer: Self-pay

## 2018-11-27 NOTE — Telephone Encounter (Signed)
Copied from [place] [phone]. Topic: General - Other >> Nov 27, 2018 10:00 AM Keene Breath wrote: Reason for CRM: Patient is returning a call to the nurse.  Call after 1pm at [phone]

## 2018-11-27 NOTE — Telephone Encounter (Signed)
See phone encounter from 11/23/2018

## 2018-11-30 ENCOUNTER — Other Ambulatory Visit: Payer: Self-pay | Admitting: Oral Surgery

## 2018-11-30 DIAGNOSIS — R1311 Dysphagia, oral phase: Secondary | ICD-10-CM

## 2018-11-30 DIAGNOSIS — M619 Calcification and ossification of muscle, unspecified: Secondary | ICD-10-CM

## 2018-12-05 ENCOUNTER — Ambulatory Visit (HOSPITAL_COMMUNITY): Admission: RE | Admit: 2018-12-05 | Payer: PPO | Source: Ambulatory Visit

## 2018-12-05 DIAGNOSIS — M26629 Arthralgia of temporomandibular joint, unspecified side: Secondary | ICD-10-CM | POA: Diagnosis not present

## 2018-12-11 ENCOUNTER — Telehealth (INDEPENDENT_AMBULATORY_CARE_PROVIDER_SITE_OTHER): Payer: PPO | Admitting: Family Medicine

## 2018-12-11 ENCOUNTER — Other Ambulatory Visit: Payer: Self-pay

## 2018-12-11 DIAGNOSIS — R195 Other fecal abnormalities: Secondary | ICD-10-CM | POA: Diagnosis not present

## 2018-12-11 NOTE — Progress Notes (Signed)
Virtual Visit via Telephone Note  I connected with Collin [name] on 12/11/18 at  3:00 PM EST by telephone and verified that I am speaking with the correct person using two identifiers.   I discussed the limitations, risks, security and privacy concerns of performing an evaluation and management service by telephone and the availability of in person appointments. I also discussed with the patient that there may be a patient responsible charge related to this service. The patient expressed understanding and agreed to proceed.  Location patient: home Location provider: work or home office Participants present for the call: patient, provider Patient did not have a visit in the prior 7 days to address this/these issue(s).   History of Present Illness: Pt states having "bowel problems".  Can't tell when will have to go, but going frequently.  Noticed symptoms last wk.  Stools are soft, not runny.  Going 2-3x/ day.  Endorses being on abx Oct 27 or 28 x 7 days for possible dental infection/TMJ issues.  Endorses increased flatus.  Eating fiber in the am, but recently stopped.  Going to the gym. Pt denies fever, chills, n/v, blood in stools, increased odor to stools, sick contacts.  Pt has not tried anything for his symptoms.   Observations/Objective: Patient sounds cheerful and well on the phone. I do not appreciate any SOB. Speech and thought processing are grossly intact. Patient reported vitals:  Assessment and Plan: Loose stools -discussed possible causes including viral enteritis, increased fiber intake, medications, constipation, consider c.diff 2/2 recent abs use- thought only having 2-3 non-malodorus loose stools per day. -pt advised to try probiotic and consider imodium -hydration encouraged.  Pt hesitant given h/o BPH. -precautions given for continued or worsening symptoms.  Consider stool studies and cx.  Follow Up Instructions: F/u prn in 2 days  [id number] 5-10 [id number] 11-20 9443  21-30 I did not refer this patient for an OV in the next 24 hours for this/these issue(s).  I discussed the assessment and treatment plan with the patient. The patient was provided an opportunity to ask questions and all were answered. The patient agreed with the plan and demonstrated an understanding of the instructions.   The patient was advised to call back or seek an in-person evaluation if the symptoms worsen or if the condition fails to improve as anticipated.  I provided 14:23 minutes of non-face-to-face time during this encounter.   Billie Ruddy, MD

## 2018-12-15 ENCOUNTER — Telehealth: Payer: Self-pay | Admitting: *Deleted

## 2018-12-15 NOTE — Telephone Encounter (Signed)
Patient seen by Dr. Volanda Napoleon on 12/11/2018

## 2018-12-15 NOTE — Telephone Encounter (Signed)
Copied from [place] [phone]. Topic: General - Other >> Dec 15, 2018 10:31 AM Celene Kras wrote: Reason for CRM: Pt called stating he had a visit earlier this week and that he is not doing better. Please advise.

## 2018-12-16 NOTE — Telephone Encounter (Signed)
Patient called after hours line this morning. Patient reports he spoke to Dr. Volanda Napoleon on Monday and Tuesday for diarrhea. Was told to call back on Friday if not better. he was told to get probiotics and Immodium has 3-4 BMs daily that are softer than usual. he is having some rectal bleeding when he wipes. Patient was advised to go to an urgent care but refused.

## 2018-12-18 ENCOUNTER — Telehealth (INDEPENDENT_AMBULATORY_CARE_PROVIDER_SITE_OTHER): Payer: PPO | Admitting: Family Medicine

## 2018-12-18 DIAGNOSIS — K649 Unspecified hemorrhoids: Secondary | ICD-10-CM

## 2018-12-18 DIAGNOSIS — R194 Change in bowel habit: Secondary | ICD-10-CM

## 2018-12-18 NOTE — Progress Notes (Signed)
Virtual Visit via Telephone Note  I connected with Collin [name] on 12/18/18 at  3:30 PM EST by telephone and verified that I am speaking with the correct person using two identifiers.   I discussed the limitations, risks, security and privacy concerns of performing an evaluation and management service by telephone and the availability of in person appointments. I also discussed with the patient that there may be a patient responsible charge related to this service. The patient expressed understanding and agreed to proceed.  Location patient: home Location provider: work or home office Participants present for the call: patient, provider Patient did not have a visit in the prior 7 days to address this/these issue(s).   History of Present Illness: Pt is an 78 yo male with pmh sig for still having bowel problems.  States tried imodium and probiotics without improvement.  Pt still having soft, formed, BMs 4-5 x per day but overall feels good.  Pt states he initially thought he was having diarrhea as it was a possible s/e of the abx he took on 10/27 x 7 days for possible dental infection.  Pt denies blood in stools, loss of bowel or bladder, fever, chills.  Pt endorses hemorrhoids 2/2 increased BMs , but states they are not causing any issues.  Pt had yogurt and oatmeal this am, staying hydrated.  Denies changes in diet.  Went to the gym this am.  Had a BM at 4 am, 8 am, and 11 am today.   Observations/Objective: Patient sounds cheerful and well on the phone. I do not appreciate any SOB. Speech and thought processing are grossly intact. Patient reported vitals:  Assessment and Plan: Bowel habit changes -pt initially thought he was having diarrhea, but having 3-4 formed but soft BMs/day.   -discussed bland diet, advance as tolerated.  Avoid increased fiber intake at this time. -food diary -continue probiotic  -consider stool studies -discussed f/u with GI.  Seen by Dr. Carlean Purl.  Unsure if  referral needed, but will place just in case.   -colonoscopy due in April 2021  Hemorrhoids -likely 2/2 increased BMs -discussed using OTC preparation H or Tuck's wipes -bleeding resolved -discussed f/u with GI. -given precautions  Follow Up Instructions: F/u prn  I did not refer this patient for an OV in the next 24 hours for this/these issue(s).  I discussed the assessment and treatment plan with the patient. The patient was provided an opportunity to ask questions and all were answered. The patient agreed with the plan and demonstrated an understanding of the instructions.   The patient was advised to call back or seek an in-person evaluation if the symptoms worsen or if the condition fails to improve as anticipated.  I provided 20 minutes of non-face-to-face time during this encounter.   Billie Ruddy, MD

## 2018-12-19 NOTE — Telephone Encounter (Signed)
Pt was already evaluated by Dr Volanda Napoleon on 12/18/18 and referred to GI. Orlando Thalmann Martinique, MD

## 2019-01-03 ENCOUNTER — Telehealth: Payer: Self-pay | Admitting: Internal Medicine

## 2019-01-03 NOTE — Telephone Encounter (Signed)
Good afternoon Dr. Carlean Purl, this patient's previous OV was 06/28/2017 when he discussed whether to pursue future colonoscopy.  Patient called today and would like to to schedule a colonoscopy.  He is currently not experiencing any GI symptoms.  Please advise scheduling.

## 2019-01-04 NOTE — Telephone Encounter (Signed)
Pt requested to wait until April until after he has had the COVID vaccine.  He reported no watery diarrhea or active GI symptoms.  Pt stated that he has a hernia and inquired whether it would be a problem to have colonoscopy.

## 2019-01-04 NOTE — Telephone Encounter (Signed)
Chart reviewed Looks like he is experiencing a change in bowel habits  OK to set up a direct colonoscopy for that and hx colon polyps  Tell him to send a My chart message or let you know if he has any other ? Or concerns AND if he is having watery diarrhea (don't think so) let's have RN staff do a C diff test

## 2019-01-04 NOTE — Telephone Encounter (Signed)
Spoke with pt and let him know his hernia should not be an issue with the colonoscopy. He wants to wait until he receives the covid vaccine to have the colon. Dr. Carlean Purl aware.

## 2019-01-09 ENCOUNTER — Ambulatory Visit: Payer: PPO | Admitting: Nurse Practitioner

## 2019-01-30 ENCOUNTER — Encounter: Payer: Self-pay | Admitting: Family Medicine

## 2019-02-05 ENCOUNTER — Other Ambulatory Visit: Payer: Self-pay | Admitting: Cardiovascular Disease

## 2019-02-06 NOTE — Telephone Encounter (Signed)
Pt had labs drawn in October and they were fine. Pt last seen 12/2017 and was told to f/u in 2 years.  Ok to fill medication.

## 2019-02-08 IMAGING — CR DG CHEST 2V
2 series · 2 of 2 positions shown · non-contrast
Comparison: None.

CLINICAL DATA: Preop for lumbar spine surgery

EXAM:
CHEST - 2 VIEW

[w chest pa]
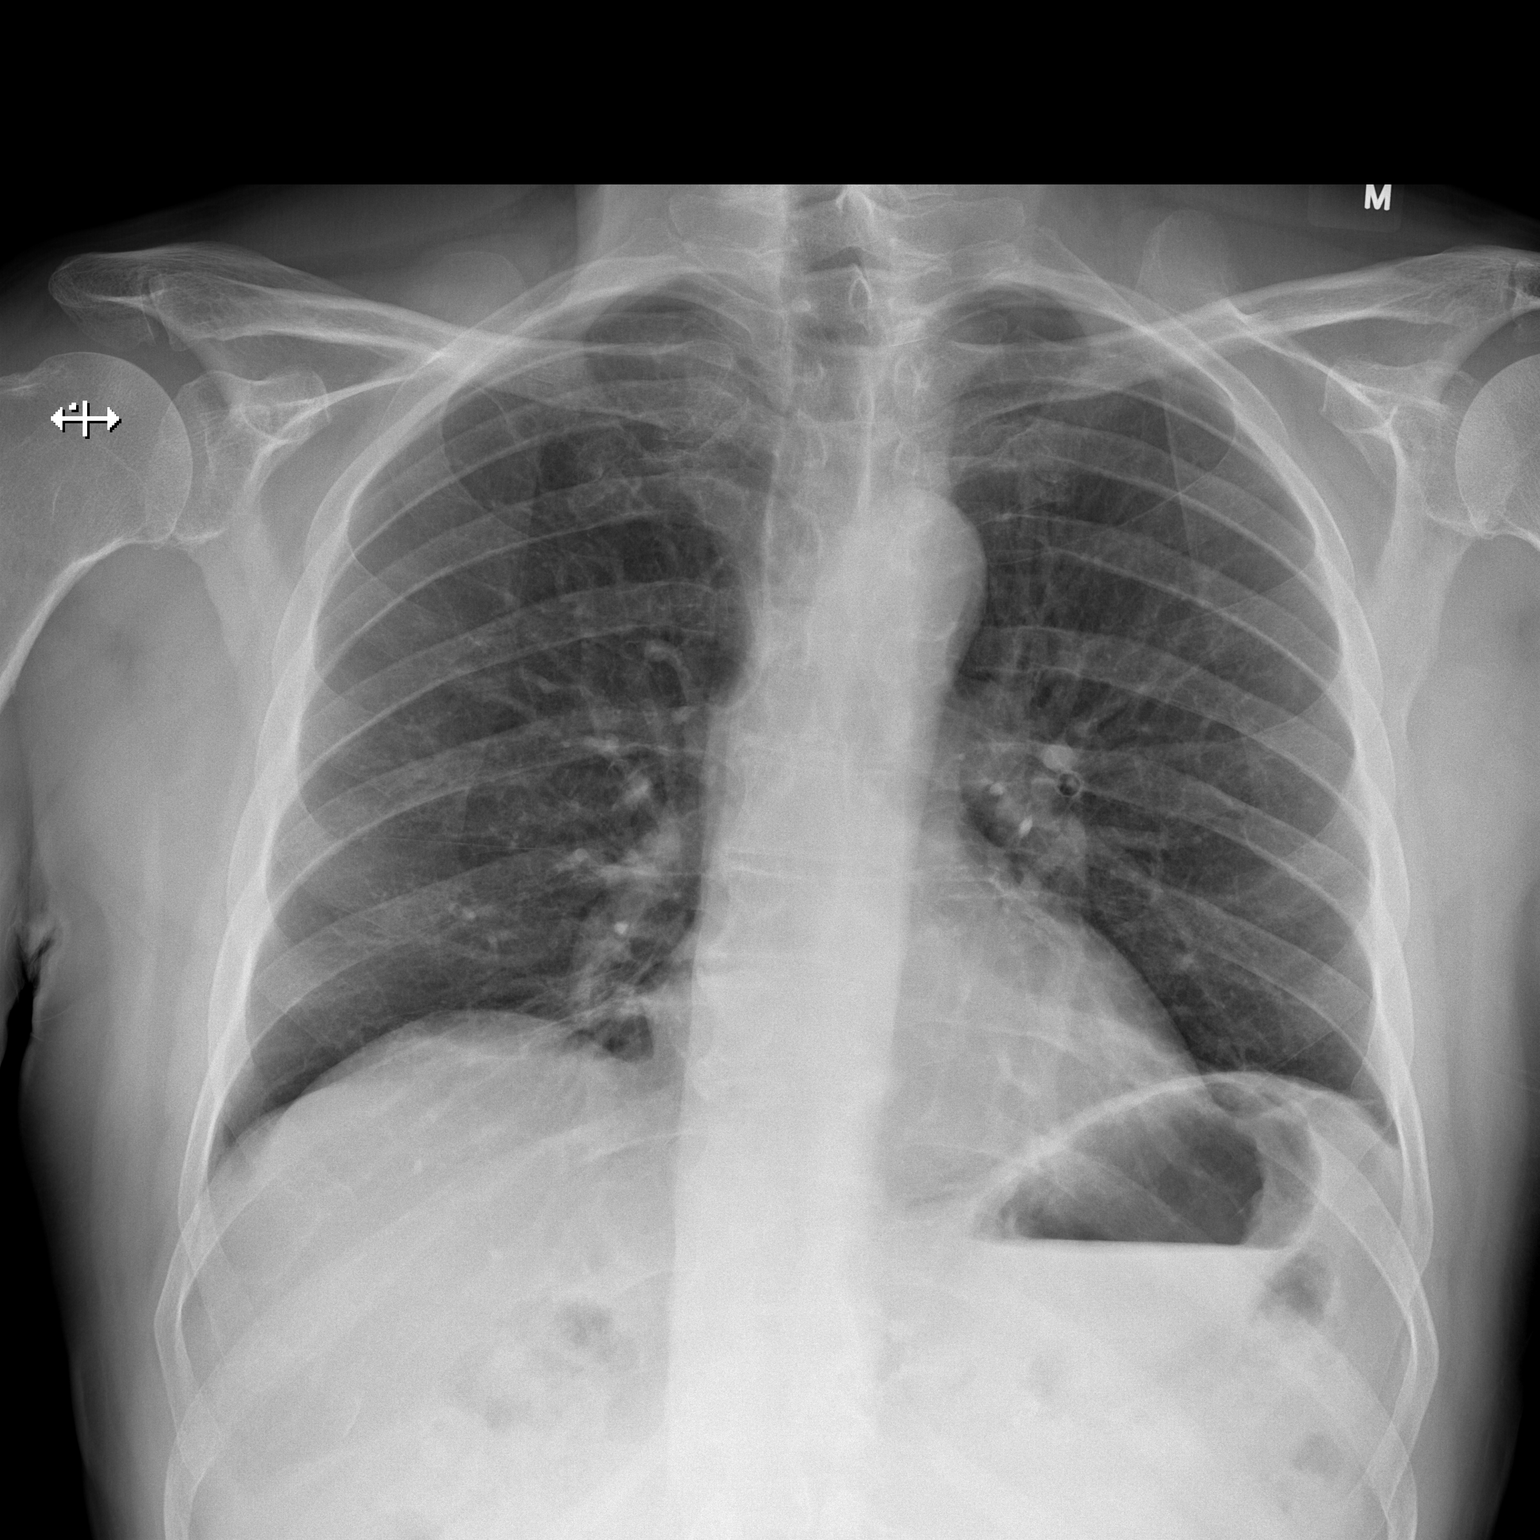

[w chest lat]
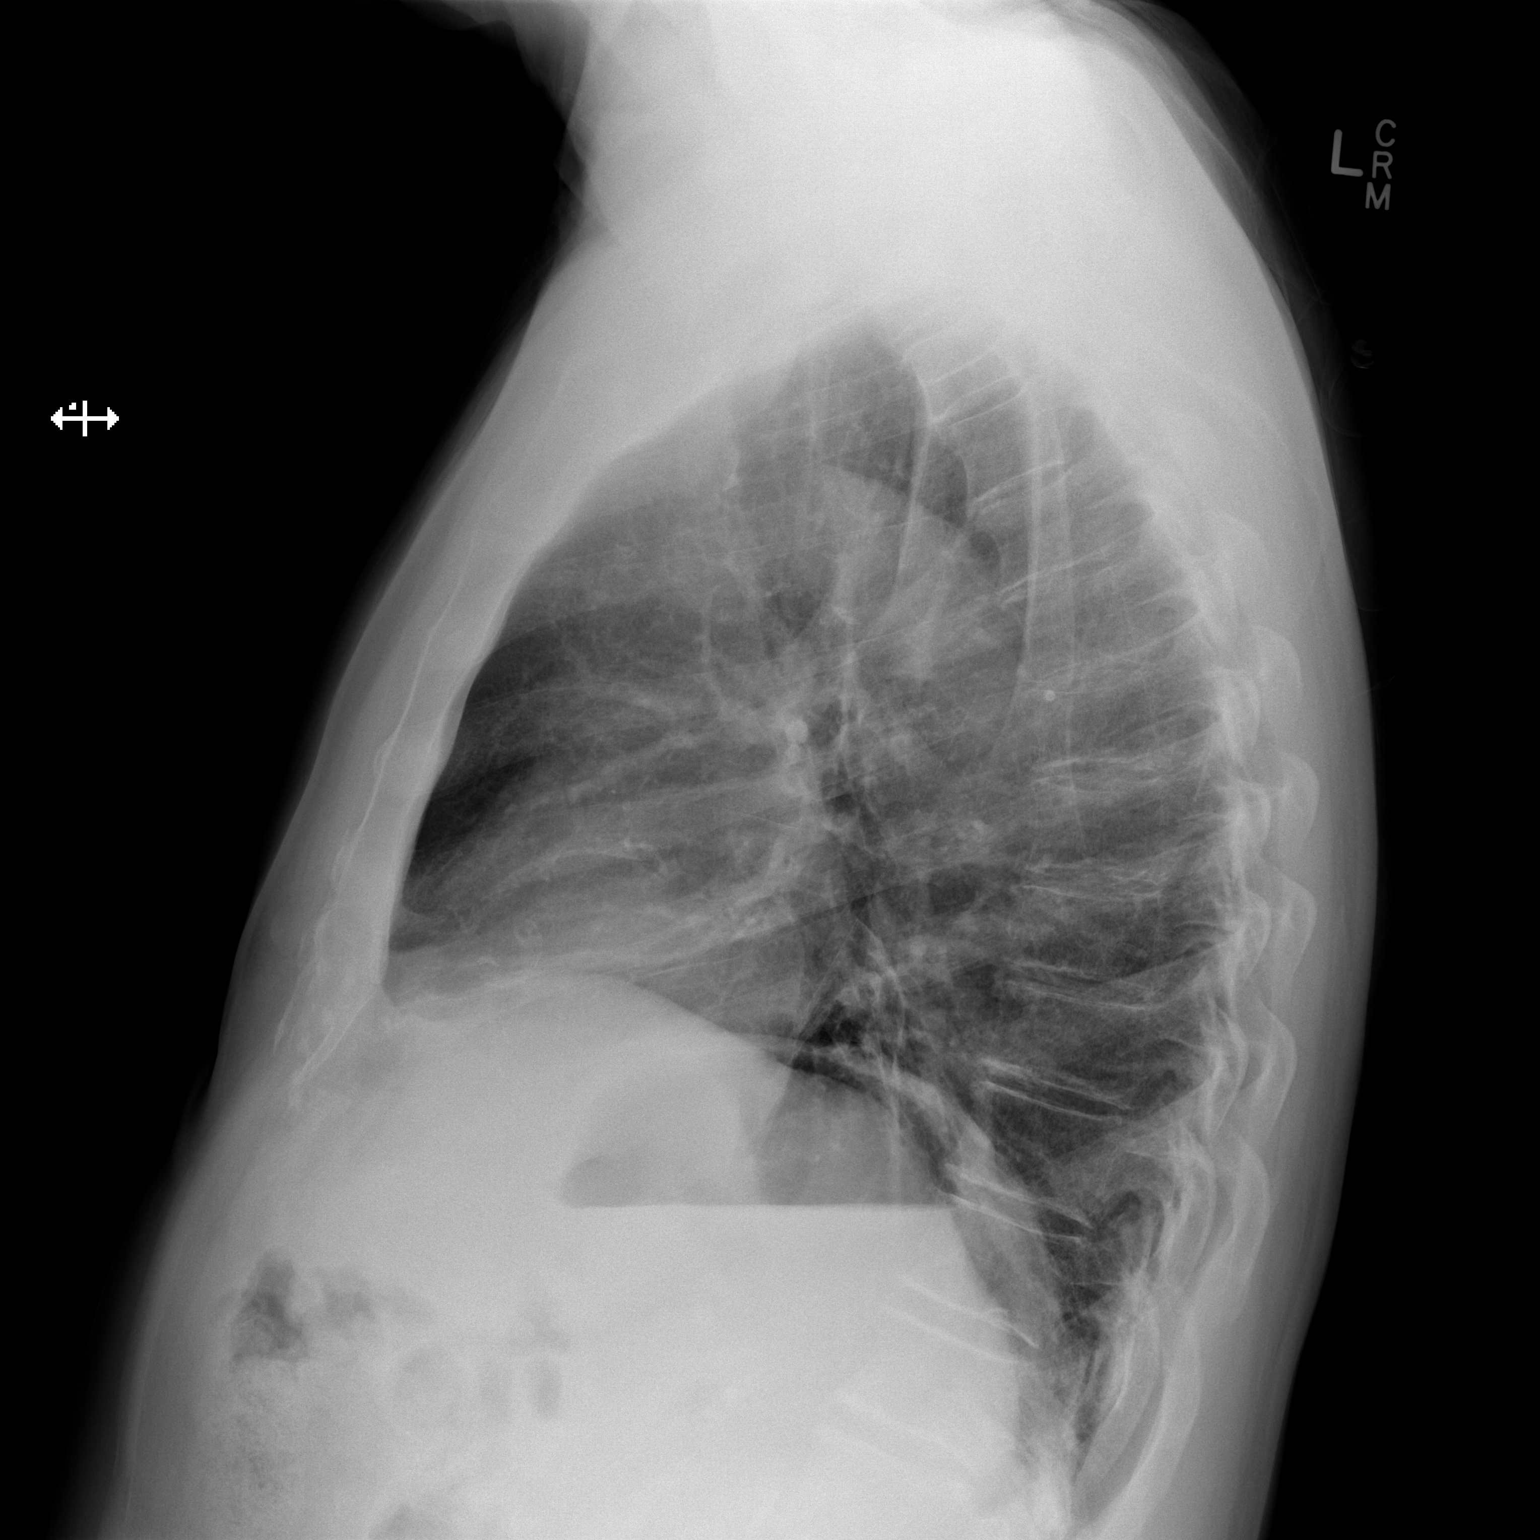

[2 of 2 positions shown; findings below may reference images not displayed]

FINDINGS: The heart size and mediastinal contours are within normal limits.
Both lungs are clear. The visualized skeletal structures are
unremarkable.
IMPRESSION: Clear lungs.

## 2019-02-17 ENCOUNTER — Encounter: Payer: Self-pay | Admitting: Family Medicine

## 2019-02-19 IMAGING — CR DG LUMBAR SPINE 1V
1 series · 1 of 1 positions shown · non-contrast
Comparison: 12/27/2017

CLINICAL DATA: L5-S1 decompression

EXAM:
LUMBAR SPINE - 1 VIEW

[xtable lateral]
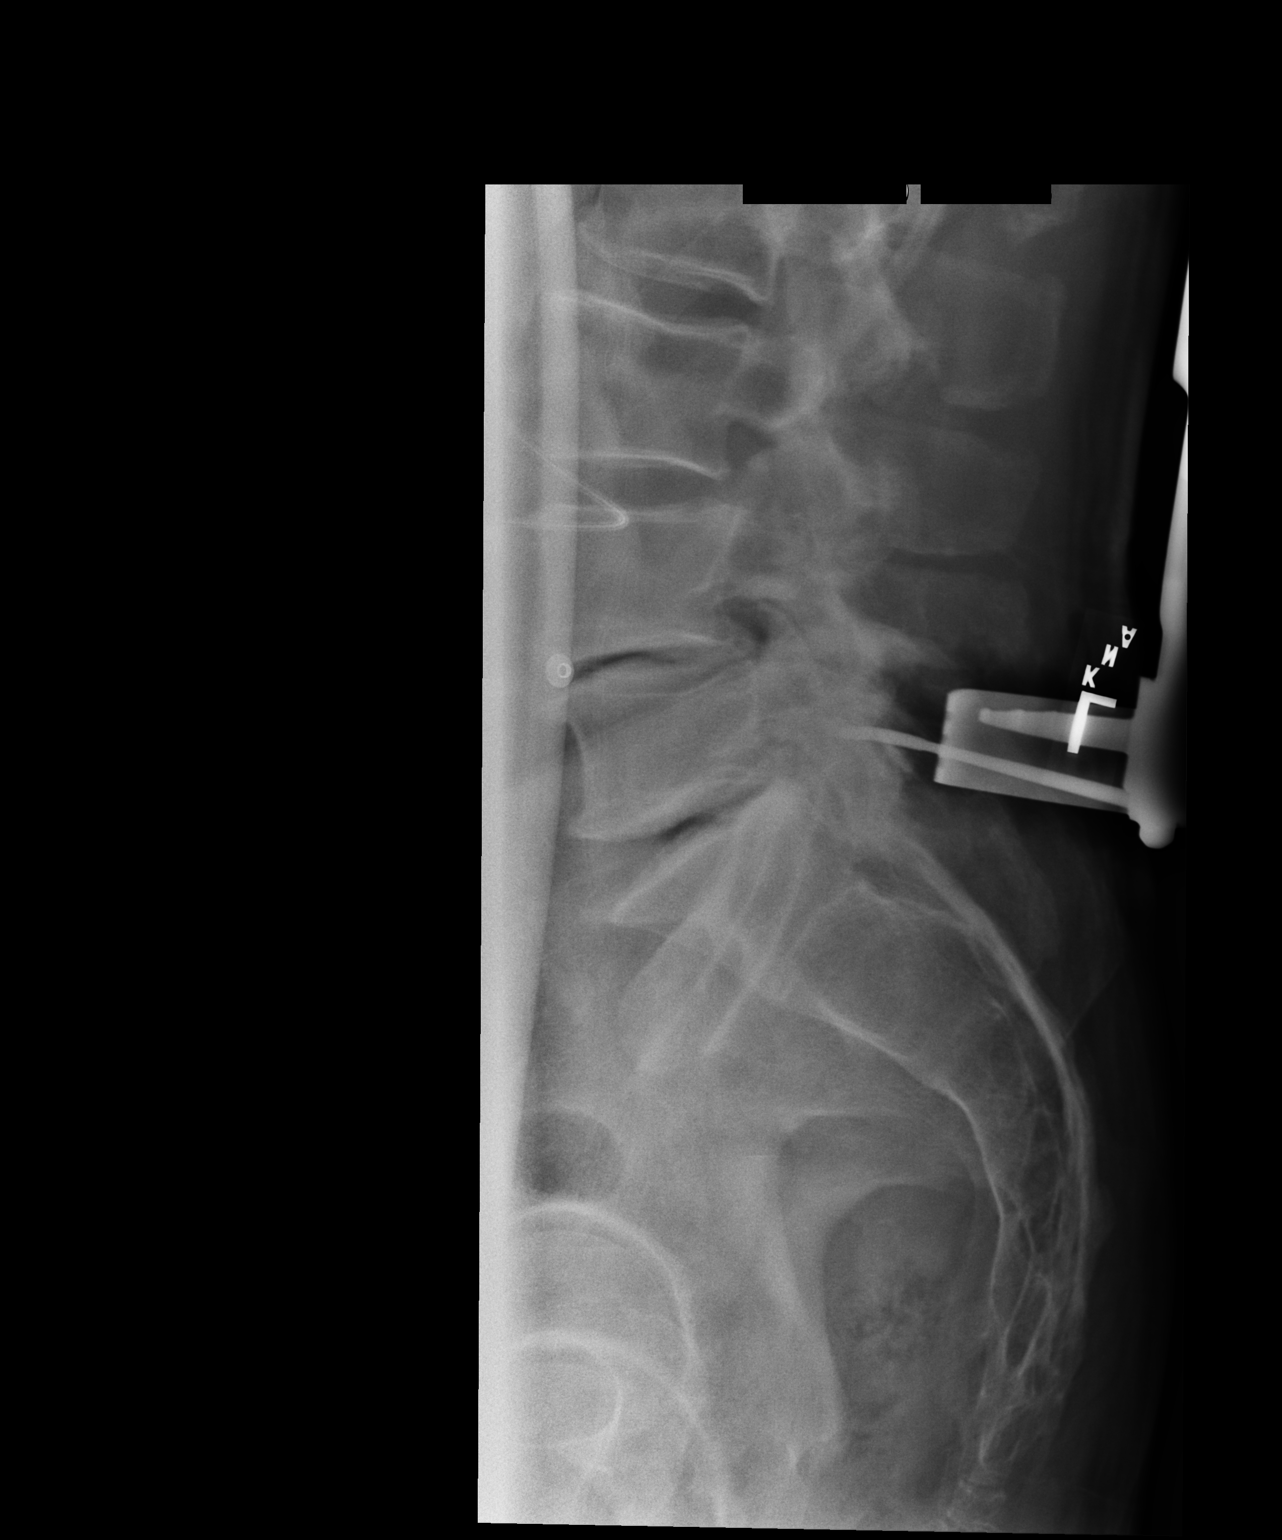

[1 of 1 positions shown; findings below may reference images not displayed]

FINDINGS: Posterior surgical instrument is directed at the L5 vertebral body.
IMPRESSION: Intraoperative localization as above.

## 2019-02-22 ENCOUNTER — Ambulatory Visit: Payer: PPO

## 2019-03-02 ENCOUNTER — Ambulatory Visit: Payer: PPO | Attending: Internal Medicine

## 2019-03-02 DIAGNOSIS — Z23 Encounter for immunization: Secondary | ICD-10-CM | POA: Insufficient documentation

## 2019-03-02 NOTE — Progress Notes (Signed)
   Covid-19 Vaccination Clinic  Name:  Collin [name]    MRN: [id number] DOB: 1940/07/26  03/02/2019  [name] was observed post Covid-19 immunization for 15 minutes without incidence. He was provided with Vaccine Information Sheet and instruction to access the V-Safe system.   [name] was instructed to call 911 with any severe reactions post vaccine: Marland Kitchen Difficulty breathing  . Swelling of your face and throat  . A fast heartbeat  . A bad rash all over your body  . Dizziness and weakness    Immunizations Administered    Name Date Dose VIS Date Route   Pfizer COVID-19 Vaccine 03/02/2019 12:06 PM 0.3 mL 01/05/2019 Intramuscular   Manufacturer: [place]   Lot: [id number]   [place]: [id number]

## 2019-03-27 ENCOUNTER — Ambulatory Visit: Payer: PPO | Attending: Internal Medicine

## 2019-03-27 DIAGNOSIS — Z23 Encounter for immunization: Secondary | ICD-10-CM | POA: Insufficient documentation

## 2019-03-27 NOTE — Progress Notes (Signed)
   Covid-19 Vaccination Clinic  Name:  Shauna Varelas Gonsalves    MRN: [id number] DOB: [birth year]  03/27/2019  Mr. Richardson was observed post Covid-19 immunization for 15 minutes without incident. He was provided with Vaccine Information Sheet and instruction to access the V-Safe system.   Mr. Gnau was instructed to call 911 with any severe reactions post vaccine: Marland Kitchen Difficulty breathing  . Swelling of face and throat  . A fast heartbeat  . A bad rash all over body  . Dizziness and weakness   Immunizations Administered    Name Date Dose VIS Date Route   Pfizer COVID-19 Vaccine 03/27/2019 12:13 PM 0.3 mL 01/05/2019 Intramuscular   Manufacturer: [place]   Lot: [id number]   [place]: [id number]

## 2019-04-30 DIAGNOSIS — Z961 Presence of intraocular lens: Secondary | ICD-10-CM | POA: Diagnosis not present

## 2019-04-30 DIAGNOSIS — H40013 Open angle with borderline findings, low risk, bilateral: Secondary | ICD-10-CM | POA: Diagnosis not present

## 2019-05-06 ENCOUNTER — Other Ambulatory Visit: Payer: Self-pay | Admitting: Cardiovascular Disease

## 2019-05-21 ENCOUNTER — Telehealth: Payer: Self-pay | Admitting: Cardiovascular Disease

## 2019-05-21 MED ORDER — METOPROLOL SUCCINATE ER 25 MG PO TB24: 25.0000 mg | ORAL_TABLET | Freq: Every day | ORAL | 3 refills | Status: DC

## 2019-05-21 MED ORDER — SIMVASTATIN 40 MG PO TABS: 40.0000 mg | ORAL_TABLET | Freq: Every day | ORAL | 3 refills | Status: DC

## 2019-05-21 NOTE — Telephone Encounter (Signed)
Meds called in per request. Pt notified.

## 2019-05-21 NOTE — Telephone Encounter (Signed)
*  STAT* If patient is at the pharmacy, call can be transferred to refill team.   1. Which medications need to be refilled? (please list name of each medication and dose if known)  simvastatin (ZOCOR) 40 MG tablet  metoprolol succinate (TOPROL-XL) 25 MG 24 hr tablet     2. Which pharmacy/location (including street and city if local pharmacy) is medication to be sent to? [id number] - [place], [place] AT [place]  3. Do they need a 30 day or 90 day supply? 90 day supply  Patient is requesting to have medications refilled. He states the pharmacy is denying refills for the medication due to notes attached reading, "please make overdue appt with Dr. Burt Knack before anymore refills." However, the patient is not due for a follow up appointment with Dr. Burt Knack until 12/18/19. He states he only sees Dr. Burt Knack every 2 years. Please call to assist.

## 2019-06-04 ENCOUNTER — Other Ambulatory Visit: Payer: Self-pay

## 2019-06-04 ENCOUNTER — Encounter: Payer: Self-pay | Admitting: Family Medicine

## 2019-06-04 ENCOUNTER — Ambulatory Visit: Payer: PPO | Admitting: Family Medicine

## 2019-06-04 VITALS — BP 120/82 | HR 64 | Ht 73.0 in | Wt 194.0 lb

## 2019-06-04 DIAGNOSIS — R279 Unspecified lack of coordination: Secondary | ICD-10-CM

## 2019-06-04 DIAGNOSIS — R2681 Unsteadiness on feet: Secondary | ICD-10-CM | POA: Diagnosis not present

## 2019-06-04 DIAGNOSIS — M2550 Pain in unspecified joint: Secondary | ICD-10-CM

## 2019-06-04 DIAGNOSIS — G250 Essential tremor: Secondary | ICD-10-CM | POA: Diagnosis not present

## 2019-06-04 DIAGNOSIS — R2689 Other abnormalities of gait and mobility: Secondary | ICD-10-CM | POA: Diagnosis not present

## 2019-06-04 DIAGNOSIS — M5416 Radiculopathy, lumbar region: Secondary | ICD-10-CM | POA: Diagnosis not present

## 2019-06-04 LAB — COMPREHENSIVE METABOLIC PANEL
ALT: 30 U/L (ref 0–53)
AST: 38 U/L — ABNORMAL HIGH (ref 0–37)
Albumin: 4.4 g/dL (ref 3.5–5.2)
Alkaline Phosphatase: 50 U/L (ref 39–117)
BUN: 21 mg/dL (ref 6–23)
CO2: 27 mEq/L (ref 19–32)
Calcium: 9.6 mg/dL (ref 8.4–10.5)
Chloride: 102 mEq/L (ref 96–112)
Creatinine, Ser: 1.15 mg/dL (ref 0.40–1.50)
GFR: 61.05 mL/min (ref >60.00)
Glucose, Bld: 129 mg/dL — ABNORMAL HIGH (ref 70–99)
Potassium: 4.6 mEq/L (ref 3.5–5.1)
Sodium: 136 mEq/L (ref 135–145)
Total Bilirubin: 0.7 mg/dL (ref 0.2–1.2)
Total Protein: 7.2 g/dL (ref 6.0–8.3)

## 2019-06-04 LAB — C-REACTIVE PROTEIN: CRP: <1 mg/dL (ref 0.5–20.0)

## 2019-06-04 LAB — SEDIMENTATION RATE: Sed Rate: 31 mm/hr — ABNORMAL HIGH (ref 0–20)

## 2019-06-04 LAB — CBC WITH DIFFERENTIAL/PLATELET
Basophils Absolute: 0 10*3/uL (ref 0.0–0.1)
Basophils Relative: 0.5 % (ref 0.0–3.0)
Eosinophils Absolute: 0.2 10*3/uL (ref 0.0–0.7)
Eosinophils Relative: 3.4 % (ref 0.0–5.0)
HCT: 45.9 % (ref 39.0–52.0)
Hemoglobin: 15.3 g/dL (ref 13.0–17.0)
Lymphocytes Relative: 36.9 % (ref 12.0–46.0)
Lymphs Abs: 2.4 10*3/uL (ref 0.7–4.0)
MCHC: 33.4 g/dL (ref 30.0–36.0)
MCV: 88.1 fl (ref 78.0–100.0)
Monocytes Absolute: 0.7 10*3/uL (ref 0.1–1.0)
Monocytes Relative: 10.6 % (ref 3.0–12.0)
Neutro Abs: 3.2 10*3/uL (ref 1.4–7.7)
Neutrophils Relative %: 48.6 % (ref 43.0–77.0)
Platelets: 201 10*3/uL (ref 150.0–400.0)
RBC: 5.22 Mil/uL (ref 4.22–5.81)
RDW: 13.6 % (ref 11.5–15.5)
WBC: 6.6 10*3/uL (ref 4.0–10.5)

## 2019-06-04 LAB — FERRITIN: Ferritin: 60.2 ng/mL (ref 22.0–322.0)

## 2019-06-04 LAB — VITAMIN D 25 HYDROXY (VIT D DEFICIENCY, FRACTURES): VITD: 92.88 ng/mL (ref 30.00–100.00)

## 2019-06-04 LAB — TSH: TSH: 1.98 u[IU]/mL (ref 0.35–4.50)

## 2019-06-04 LAB — IBC PANEL
Iron: 119 ug/dL (ref 42–165)
Saturation Ratios: 29.7 % (ref 20.0–50.0)
Transferrin: 286 mg/dL (ref 212.0–360.0)

## 2019-06-04 LAB — TESTOSTERONE: Testosterone: 263.95 ng/dL — ABNORMAL LOW (ref 300.00–890.00)

## 2019-06-04 LAB — URIC ACID: Uric Acid, Serum: 6.8 mg/dL (ref 4.0–7.8)

## 2019-06-04 MED ORDER — GABAPENTIN 100 MG PO CAPS
200.0000 mg | ORAL_CAPSULE | Freq: Every day | ORAL | 3 refills | Status: DC
Start: 2019-06-04 — End: 2020-01-01

## 2019-06-04 NOTE — Assessment & Plan Note (Signed)
Patient does have a status post laminotomy.  Foot drop noted on the left side.  This is contributing to weakness and likely secondary to patient's fall.  Patient does have some wide-based gait that is concerning for potential early Parkinson's.  Has had an EMG previously that was unremarkable.  We will continue to monitor closely.  Discussed icing regimen, home exercise, which activities to do which wants to avoid.  Patient given gabapentin today that I think will be beneficial.  Discussed starting with balance and coordination physical therapy that I think will be helpful as well.  Follow-up again in 4 to 8 weeks

## 2019-06-04 NOTE — Assessment & Plan Note (Signed)
Tremor noted with masked facies we will continue to monitor

## 2019-06-04 NOTE — Patient Instructions (Signed)
Great to see you  Continue all the vitamins Add tart cherry extract 1200mg  at night Gabapentin 100mg  at night only  Stay active and PT will call you  Heel lift in the toe of your shoe backwards and do not lace last eye on your shoe.  You will do great! See me again in 6-7 weeks

## 2019-06-04 NOTE — Progress Notes (Signed)
[place] Phone: [phone] Subjective:   Fontaine No, am serving as a scribe for Dr. Hulan Saas. This visit occurred during the SARS-CoV-2 public health emergency.  Safety protocols were in place, including screening questions prior to the visit, additional usage of staff PPE, and extensive cleaning of exam room while observing appropriate contact time as indicated for disinfecting solutions.   I'm seeing this patient by the request  of:  Martinique, Betty G, MD  CC: Difficulty with balance and leg weakness  [id number]  Sherod Paxton Basil is a 79 y.o. male coming in with complaint of left knee pain , drop foot and balance issues. Last seen on 08/24/2017 for back pain. Patient states he was walking 4 weeks ago and fell. Landed on left knee. Pain has subsided but wants to make sure everything is ok with the knee. Has been going to the gym using elliptical, weights and golfs 2x a week.  Having weakness and drop foot on left leg and foot. History of back surgery 1.5 years ago.  Also complains of balance issues as well as issues with cognition and dexterity issues with his hands. Notes trembling in his hands.  Also would like recommendations on vitamins and natural supplements that he should be taking daily.  MRI IMPRESSION 2019 IMPRESSION: 1. Minimal grade 1 L3-4 and L4-5 anterolisthesis on degenerative basis. 2. Degenerative change of lumbar spine without fracture. Severe lower lumbar facet arthropathy, RIGHT multifidus atrophy seen with facet arthropathy in denervation. 3. Moderate to severe canal stenosis moderate canal stenosis L3-4. 4. Neural foraminal narrowing L2-3 through L5-S1: Severe on the RIGHT at L4-5 (due to extrusion versus facet synovial cyst) and severe on the LEFT at L5-S1 (due to extrusion versus facet synovial cyst) with probable LEFT L5 neuropathy.  Patient had surgery in December 2019 for  his back.      Past Medical History:  Diagnosis Date  . Allergy   . Arthritis   . BPH (benign prostatic hyperplasia)   . CAD (coronary artery disease)   . Cataract   . Diverticulosis of colon   . Hx of adenomatous colonic polyps 05/22/2014  . Hyperlipidemia   . Hypertension   . MYOCARDIAL INFARCTION, HX OF 07/20/2006   Qualifier: Diagnosis of  By: Leanne Chang MD, Bruce     Past Surgical History:  Procedure Laterality Date  . CATARACT EXTRACTION     Bil  . COLONOSCOPY    . EYE SURGERY    . LUMBAR LAMINECTOMY/DECOMPRESSION MICRODISCECTOMY Left 01/20/2018   Procedure: Left Lumbar five Sacral one extraforaminal decompression;  Surgeon: Eustace Moore, MD;  Location: [facility];  Service: Neurosurgery;  Laterality: Left;  . PTCA     Social History   Socioeconomic History  . Marital status: Married    Spouse name: Not on file  . Number of children: 3  . Years of education: 62  . Highest education level: Not on file  Occupational History  . Occupation: retired  Tobacco Use  . Smoking status: Never Smoker  . Smokeless tobacco: Never Used  Substance and Sexual Activity  . Alcohol use: Yes    Alcohol/week: 1.0 standard drinks    Types: 1 Glasses of wine per week    Comment: Occasional beer.  . Drug use: No  . Sexual activity: Not on file  Other Topics Concern  . Not on file  Social History Narrative   Lives with wife in a 2  story home.  Has 3 children.     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    Education: college.    Occasional beer, no smoking, no drugs   Social Determinants of Health   Financial Resource Strain:   . Difficulty of Paying Living Expenses:   Food Insecurity:   . Worried About Charity fundraiser in the Last Year:   . Arboriculturist in the Last Year:   Transportation Needs:   . Film/video editor (Medical):   Marland Kitchen Lack of Transportation (Non-Medical):   Physical Activity:   . Days of Exercise per Week:   . Minutes of Exercise  per Session:   Stress:   . Feeling of Stress :   Social Connections:   . Frequency of Communication with Friends and Family:   . Frequency of Social Gatherings with Friends and Family:   . Attends Religious Services:   . Active Member of Clubs or Organizations:   . Attends Archivist Meetings:   Marland Kitchen Marital Status:    Allergies  Allergen Reactions  . Penicillins Rash and Other (See Comments)    PATIENT HAS HAD A PCN REACTION WITH IMMEDIATE RASH, FACIAL/TONGUE/THROAT SWELLING, SOB, OR LIGHTHEADEDNESS WITH HYPOTENSION:  #  #  YES  #  #  Has patient had a PCN reaction causing severe rash involving mucus membranes or skin necrosis: No Has patient had a PCN reaction that required hospitalization: No Has patient had a PCN reaction occurring within the last 10 years: No If all of the above answers are "NO", then may proceed with Cephalosporin use.   Family History  Problem Relation Age of Onset  . Heart disease Mother        CABG  . Stroke Father   . Kidney disease Father        renal failure  . AAA (abdominal aortic aneurysm) Father   . Hypertension Sister   . Diabetes Brother   . AAA (abdominal aortic aneurysm) Paternal Uncle   . Colon cancer Neg Hx      Current Outpatient Medications (Cardiovascular):  .  metoprolol succinate (TOPROL-XL) 25 MG 24 hr tablet, Take 1 tablet (25 mg total) by mouth daily. .  nitroGLYCERIN (NITROSTAT) 0.4 MG SL tablet, Place 1 tablet (0.4 mg total) under the tongue every 5 (five) minutes as needed for chest pain. .  simvastatin (ZOCOR) 40 MG tablet, Take 1 tablet (40 mg total) by mouth daily at 6 PM.   Current Outpatient Medications (Analgesics):  .  aspirin 81 MG tablet, Take 1 tablet (81 mg total) by mouth at bedtime.  Current Outpatient Medications (Hematological):  .  vitamin B-12 (CYANOCOBALAMIN) 1000 MCG tablet, Take 1,000 mcg by mouth daily.  Current Outpatient Medications (Other):  Marland Kitchen  Bioflavonoid Products (GRAPE SEED PO)*, Take  1 capsule by mouth daily. .  Cholecalciferol (VITAMIN D3) 125 MCG (5000 UT) CAPS, Take 5,000 Units by mouth at bedtime. .  CHOLINE PO, Take 250 mg by mouth at bedtime. .  Coenzyme Q10 (EQL COQ10) 300 MG CAPS, Take 300 mg by mouth daily. .  fish oil-omega-3 fatty acids 1000 MG capsule, Take 1 g by mouth daily.  .  Glucosamine-Chondroitin (MOVE FREE PO), Take 1 tablet by mouth daily.   .  Multiple Vitamin (MULTIVITAMIN) tablet, Take 1 tablet by mouth daily.   Marland Kitchen  pyridOXINE (VITAMIN B-6) 100 MG tablet, Take 100 mg by mouth daily. .  Saw Palmetto 450 MG CAPS, Take  450 mg by mouth at bedtime. .  gabapentin (NEURONTIN) 100 MG capsule, Take 2 capsules (200 mg total) by mouth at bedtime. * These medications belong to multiple therapeutic classes and are listed under each applicable group.   Reviewed prior external information including notes and imaging from  primary care provider As well as notes that were available from care everywhere and other healthcare systems.  Past medical history, social, surgical and family history all reviewed in electronic medical record.  No pertanent information unless stated regarding to the chief complaint.   Review of Systems:  No headache, visual changes, nausea, vomiting, diarrhea, constipation, dizziness, abdominal pain, skin rash, fevers, chills, night sweats, weight loss, swollen lymph nodes, s, joint swelling, chest pain, shortness of breath, mood changes. POSITIVE muscle aches, body aches  Objective  Blood pressure 120/82, pulse 64, height 6\' 1"  (1.854 m), weight 194 lb (88 kg), SpO2 96 %.   General: No apparent distress, patient does have mild masked facies HEENT: Pupils equal, extraocular movements intact  Respiratory: Patient's speak in full sentences and does not appear short of breath  Cardiovascular: No lower extremity edema, non tender, no erythema  Neuro: Cranial nerves II through XII are intact, neurovascularly intact in all extremities with 2+  DTRs and 2+ pulses.  Very mild cogwheeling noted though Gait mild wide-based gait.  Patient does have foot drop on the left side.  2 out of 5 strength with dorsiflexion of the foot deep tendon reflexes is intact.  No signs of clonus noted.    Impression and Recommendations:     This case required medical decision making of moderate complexity. The above documentation has been reviewed and is accurate and complete Lyndal Pulley, DO       Note: This dictation was prepared with Dragon dictation along with smaller phrase technology. Any transcriptional errors that result from this process are unintentional.

## 2019-06-05 LAB — PTH, INTACT AND CALCIUM
Calcium: 9.2 mg/dL (ref 8.6–10.3)
PTH: 38 pg/mL (ref 14–64)

## 2019-06-06 LAB — ANGIOTENSIN CONVERTING ENZYME: Angiotensin-Converting Enzyme: 20 U/L (ref 9–67)

## 2019-06-06 LAB — CALCIUM, IONIZED: Calcium, Ion: 5.12 mg/dL (ref 4.8–5.6)

## 2019-06-06 LAB — CYCLIC CITRUL PEPTIDE ANTIBODY, IGG: Cyclic Citrullin Peptide Ab: <16 UNITS

## 2019-06-06 LAB — PTH, INTACT AND CALCIUM
Calcium: 9.5 mg/dL (ref 8.6–10.3)
PTH: 24 pg/mL (ref 14–64)

## 2019-06-06 LAB — RHEUMATOID FACTOR: Rheumatoid fact SerPl-aCnc: <14 IU/mL (ref <14)

## 2019-06-06 LAB — ANA: Anti Nuclear Antibody (ANA): NEGATIVE

## 2019-06-14 ENCOUNTER — Encounter: Payer: Self-pay | Admitting: Physical Therapy

## 2019-06-14 ENCOUNTER — Other Ambulatory Visit: Payer: Self-pay

## 2019-06-14 ENCOUNTER — Ambulatory Visit: Payer: PPO | Attending: Family Medicine | Admitting: Physical Therapy

## 2019-06-14 DIAGNOSIS — R2689 Other abnormalities of gait and mobility: Secondary | ICD-10-CM | POA: Diagnosis not present

## 2019-06-14 DIAGNOSIS — R296 Repeated falls: Secondary | ICD-10-CM | POA: Insufficient documentation

## 2019-06-14 DIAGNOSIS — M6281 Muscle weakness (generalized): Secondary | ICD-10-CM | POA: Diagnosis not present

## 2019-06-14 NOTE — Patient Instructions (Signed)
Access Code: [email or web address] URL: [email or web address] Date: 06/14/2019 Prepared by: Hilda Blades  Exercises Clamshell with Resistance - 3-4 x weekly - 3 sets - 20 reps Figure 4 Bridge - 3-4 x weekly - 3 sets - 10 reps Long Sitting Ankle Plantar Flexion with Resistance - 3-4 x weekly - 3 sets - 20 reps Heel Toe Raises with Counter Support - 7 x weekly - 3 sets - 10 reps Romberg Stance with Eyes Closed - 2-3 x daily - 7 x weekly - 3 reps - 20-30 seconds hold Standing Tandem Balance with Counter Support - 2-3 x daily - 7 x weekly - 3 reps - 20-30 seconds hold

## 2019-06-15 NOTE — Therapy (Signed)
[place], [place], [address] Phone: [phone]   Fax:  [phone]  Physical Therapy Evaluation  Patient Details  Name: Collin [name] MRN: [id number] Date of Birth: December 24, 1940 Referring Provider (PT): Lyndal Pulley, DO   Encounter Date: 06/14/2019  PT End of Session - 06/14/19 1355    Visit Number  1    Number of Visits  6    Date for PT Re-Evaluation  07/26/19    Authorization Type  [employer]    PT Start Time  1300    PT Stop Time  1345    PT Time Calculation (min)  45 min    Activity Tolerance  Patient tolerated treatment well    Behavior During Therapy  [facility] for tasks assessed/performed       Past Medical History:  Diagnosis Date  . Allergy   . Arthritis   . BPH (benign prostatic hyperplasia)   . CAD (coronary artery disease)   . Cataract   . Diverticulosis of colon   . Hx of adenomatous colonic polyps 05/22/2014  . Hyperlipidemia   . Hypertension   . MYOCARDIAL INFARCTION, HX OF 07/20/2006   Qualifier: Diagnosis of  By: Leanne Chang MD, Bruce      Past Surgical History:  Procedure Laterality Date  . CATARACT EXTRACTION     Bil  . COLONOSCOPY    . EYE SURGERY    . LUMBAR LAMINECTOMY/DECOMPRESSION MICRODISCECTOMY Left 01/20/2018   Procedure: Left Lumbar five Sacral one extraforaminal decompression;  Surgeon: Eustace Moore, MD;  Location: [place];  Service: Neurosurgery;  Laterality: Left;  . PTCA      There were no vitals filed for this visit.   Subjective Assessment - 06/14/19 1359    Subjective  Patient reports he wants to check on his balance. He has a drop foot on the left and his left leg is weaker. He had a lumbar surgery in 2019 and the surgery did not help the nerve. He fell about 5-6 weeks ago and landed on his left knee, and feels his left leg weakness is causing his balance problem. He walks about 2 miles and he feels better when he can remember to lift his leg, but  sometimes he moves quicker than he can remember to lift his foot up.    Pertinent History  Lumbar surgery 12/2017    Limitations  Walking    How long can you sit comfortably?  No limitation    How long can you stand comfortably?  No limitation    How long can you walk comfortably?  No limitation    Diagnostic tests  None    Patient Stated Goals  Improve balance so he feels more comfortable walking, stand on one foot    Currently in Pain?  No/denies         [facility] PT Assessment - 06/15/19 0001      Assessment   Medical Diagnosis  Balance problem, Coordination problem    Referring Provider (PT)  Lyndal Pulley, DO    Onset Date/Surgical Date  --   approximately 6 weeks ago   Hand Dominance  Right    Next MD Visit  07/17/2019    Prior Therapy  Yes - lumbar surgery      Precautions   Precautions  Fall      Restrictions   Weight Bearing Restrictions  No      Balance Screen   Has the patient fallen  in the past 6 months  Yes    How many times?  1 around 5-6 weeks ago where he fell due to left leg weakness/foot drop    Has the patient had a decrease in activity level because of a fear of falling?   No    Is the patient reluctant to leave their home because of a fear of falling?   No      Home Film/video editor residence      Prior Function   Level of Independence  Independent    Vocation  Retired    Leisure  Going to gym, golf, walking      Cognition   Overall Cognitive Status  Within Functional Limits for tasks assessed      Observation/Other Assessments   Observations  Patient appears in no apparent distress    Focus on Therapeutic Outcomes (FOTO)   NA      Sensation   Light Touch  Appears Intact      ROM / Strength   AROM / PROM / Strength  Strength      Strength   Strength Assessment Site  Hip;Knee;Ankle    Right/Left Hip  Right;Left    Right Hip Flexion  4+/5    Right Hip Extension  4+/5    Right Hip ABduction  4/5    Left Hip  Flexion  4/5    Left Hip Extension  4/5    Left Hip ABduction  4/5    Right/Left Knee  Right;Left    Right Knee Flexion  5/5    Right Knee Extension  5/5    Left Knee Flexion  5/5    Left Knee Extension  5/5    Right/Left Ankle  Right;Left    Right Ankle Dorsiflexion  4+/5    Right Ankle Plantar Flexion  4/5    Right Ankle Inversion  4+/5    Right Ankle Eversion  4+/5    Left Ankle Dorsiflexion  2/5    Left Ankle Plantar Flexion  3/5    Left Ankle Inversion  4/5    Left Ankle Eversion  4-/5      Flexibility   Soft Tissue Assessment /Muscle Length  yes    Hamstrings  WFL    Quadriceps  Limited bilaterally      Palpation   Palpation comment  Non-TTP      Special Tests   Other special tests  None performed      Transfers   Transfers  Independent with all Transfers      Ambulation/Gait   Ambulation/Gait  Yes    Ambulation/Gait Assistance  7: Independent    Gait Comments  Mild steppage gate on left, when cued for heel strike there is a pronounced foot slap      Standardized Balance Assessment   Standardized Balance Assessment  Berg Balance Test      Berg Balance Test   Sit to Stand  Able to stand without using hands and stabilize independently    Standing Unsupported  Able to stand safely 2 minutes    Sitting with Back Unsupported but Feet Supported on Floor or Stool  Able to sit safely and securely 2 minutes    Stand to Sit  Sits safely with minimal use of hands    Transfers  Able to transfer safely, minor use of hands    Standing Unsupported with Eyes Closed  Able to stand 10 seconds with supervision  Standing Unsupported with Feet Together  Able to place feet together independently and stand 1 minute safely    From Standing, Reach Forward with Outstretched Arm  Can reach confidently >25 cm (10")    From Standing Position, Pick up Object from Floor  Able to pick up shoe safely and easily    From Standing Position, Turn to Look Behind Over each Shoulder  Looks behind  from both sides and weight shifts well    Turn 360 Degrees  Able to turn 360 degrees safely in 4 seconds or less    Standing Unsupported, Alternately Place Feet on Step/Stool  Able to stand independently and complete 8 steps >20 seconds    Standing Unsupported, One Foot in Front  Able to plae foot ahead of the other independently and hold 30 seconds    Standing on One Leg  Tries to lift leg/unable to hold 3 seconds but remains standing independently    Total Score  50                  Objective measurements completed on examination: See above findings.      [place] Adult PT Treatment/Exercise - 06/15/19 0001      Neuro Re-ed    Neuro Re-ed Details   Romberg stance with EC, tandem stance with EO      Exercises   Exercises  Knee/Hip;Ankle      Knee/Hip Exercises: Supine   Single Leg Bridge  10 reps   figure-4 position     Knee/Hip Exercises: Sidelying   Clams  x15 with green band      Ankle Exercises: Standing   Heel Raises  10 reps    Toe Raise  10 reps      Ankle Exercises: Supine   T-Band  PF with blue band x20             PT Education - 06/14/19 1354    Education Details  Exam findings, POC, HEP    Person(s) Educated  Patient    Methods  Explanation;Demonstration;Tactile cues;Verbal cues;Handout    Comprehension  Verbalized understanding;Returned demonstration;Verbal cues required;Tactile cues required;Need further instruction       PT Short Term Goals - 06/14/19 1409      PT SHORT TERM GOAL #1   Title  Patient will be I with HEP to progress strength and balance in therapy    Time  6    Period  Weeks    Status  New    Target Date  07/26/19      PT SHORT TERM GOAL #2   Title  Patient will exhibit improve strength of bilat gluteals to >/= 4+/5 MMT to improve balance and control    Time  6    Period  Weeks    Status  New    Target Date  07/26/19      PT SHORT TERM GOAL #3   Title  Patient will be able to maintain SLS for >/= 10 sec  bilaterally to improve balance and stability    Time  6    Period  Weeks    Status  New    Target Date  07/26/19      PT SHORT TERM GOAL #4   Title  Patient will have reduced fall risk as demonstrated by >/= 55 on BERG balance test    Time  6    Period  Weeks    Status  New    Target Date  07/26/19      PT SHORT TERM GOAL #5   Title  Patient will exhibit improved left ankle DF to >/= 3+/5 MMT to improve gait mechanics    Time  6    Period  Weeks    Status  New    Target Date  07/26/19        PT Long Term Goals - 06/15/19 0801      PT LONG TERM GOAL #1   Title  LTG = STC             Plan - 06/15/19 0809    Clinical Impression Statement  Patient presents to PT with report of balance deficit and left sided LE weakness with recent fall. He does exhibit moderate fall risk based on BERG balance assessment and greater strength deficit on the left mainly of gluteals and ankle. He exhibits gait deficit due to weak ankle DF on left resulting in foot drop. Patient would benefit from continued skilled PT to progress strength and balance to reduce fall risk and allow patient to return to active lifestyle without fear of falling.    Personal Factors and Comorbidities  Age;Past/Current Experience    Examination-Activity Limitations  Locomotion Level    Examination-Participation Restrictions  Community Activity    Stability/Clinical Decision Making  Stable/Uncomplicated    Clinical Decision Making  Low    Rehab Potential  Good    PT Frequency  1x / week    PT Duration  6 weeks    PT Treatment/Interventions  ADLs/Self Care Home Management;Cryotherapy;Electrical Stimulation;Moist Heat;Neuromuscular re-education;Balance training;Therapeutic exercise;Therapeutic activities;Functional mobility training;Stair training;Gait training;Patient/family education;Manual techniques;Dry needling;Passive range of motion;Taping;Joint Manipulations    PT Next Visit Plan  Assess HEP and progress PRN,  progress hip and ankle strength, balance training, gait training    PT Home Exercise Plan  [id number]: clamshell with green, figure-4 bridge, DF with blue, romberg with EC, tandem with EO    Consulted and Agree with Plan of Care  Patient       Patient will benefit from skilled therapeutic intervention in order to improve the following deficits and impairments:  Abnormal gait, Decreased strength, Decreased balance  Visit Diagnosis: Other abnormalities of gait and mobility  Muscle weakness (generalized)  Repeated falls     Problem List Patient Active Problem List   Diagnosis Date Noted  . S/P lumbar laminectomy 01/20/2018  . Essential tremor 11/02/2017  . Unstable gait 11/02/2017  . Lumbar radiculopathy 07/19/2017  . Peripheral neuropathy 12/06/2016  . Hx of adenomatous colonic polyps 05/22/2014  . Exertional angina ([place]) 09/28/2011  . Left inguinal hernia 09/10/2011  . PSA, INCREASED 08/05/2009  . CONGENITAL PIGMENTARY ANOMALY OF SKIN 07/25/2007  . Dyslipidemia 07/20/2006  . Coronary atherosclerosis 07/20/2006  . BPH associated with nocturia 07/20/2006    Hilda Blades, PT, DPT, LAT, ATC 06/15/19  8:15 AM Phone: [phone] Fax: [facility] [address] [place], [place], [address] Phone: [phone]   Fax:  [phone]  Name: Collin [name] MRN: [id number] Date of Birth: 07-16-1940

## 2019-06-19 ENCOUNTER — Ambulatory Visit: Payer: PPO

## 2019-07-02 ENCOUNTER — Ambulatory Visit: Payer: PPO | Attending: Family Medicine | Admitting: Physical Therapy

## 2019-07-02 ENCOUNTER — Other Ambulatory Visit: Payer: Self-pay

## 2019-07-02 ENCOUNTER — Encounter: Payer: Self-pay | Admitting: Physical Therapy

## 2019-07-02 DIAGNOSIS — R296 Repeated falls: Secondary | ICD-10-CM | POA: Insufficient documentation

## 2019-07-02 DIAGNOSIS — M6281 Muscle weakness (generalized): Secondary | ICD-10-CM | POA: Insufficient documentation

## 2019-07-02 DIAGNOSIS — R2689 Other abnormalities of gait and mobility: Secondary | ICD-10-CM | POA: Diagnosis not present

## 2019-07-02 DIAGNOSIS — R293 Abnormal posture: Secondary | ICD-10-CM | POA: Diagnosis not present

## 2019-07-02 DIAGNOSIS — M545 Low back pain: Secondary | ICD-10-CM | POA: Insufficient documentation

## 2019-07-02 DIAGNOSIS — G8929 Other chronic pain: Secondary | ICD-10-CM

## 2019-07-02 NOTE — Therapy (Addendum)
[place], [place], [address] Phone: [phone]   Fax:  [phone]  Physical Therapy Treatment / Discharge  Patient Details  Name: Collin [name] MRN: [id number] Date of Birth: 08-22-1940 Referring Provider (PT): Lyndal Pulley, DO   Encounter Date: 07/02/2019  PT End of Session - 07/02/19 1537    Visit Number  2    Number of Visits  6    Date for PT Re-Evaluation  07/26/19    Authorization Type  [employer]    PT Start Time  1696    PT Stop Time  1615    PT Time Calculation (min)  44 min    Activity Tolerance  Patient tolerated treatment well    Behavior During Therapy  [facility] for tasks assessed/performed       Past Medical History:  Diagnosis Date  . Allergy   . Arthritis   . BPH (benign prostatic hyperplasia)   . CAD (coronary artery disease)   . Cataract   . Diverticulosis of colon   . Hx of adenomatous colonic polyps 05/22/2014  . Hyperlipidemia   . Hypertension   . MYOCARDIAL INFARCTION, HX OF 07/20/2006   Qualifier: Diagnosis of  By: Leanne Chang MD, Bruce      Past Surgical History:  Procedure Laterality Date  . CATARACT EXTRACTION     Bil  . COLONOSCOPY    . EYE SURGERY    . LUMBAR LAMINECTOMY/DECOMPRESSION MICRODISCECTOMY Left 01/20/2018   Procedure: Left Lumbar five Sacral one extraforaminal decompression;  Surgeon: Eustace Moore, MD;  Location: [place];  Service: Neurosurgery;  Laterality: Left;  . PTCA      There were no vitals filed for this visit.  Subjective Assessment - 07/02/19 1531    Subjective  Patient reports he is doing fine. He has already worked out today and done the elliptical and [employer]. He does still feel unbalanced with tandem stance and when closing his eyes.    Patient Stated Goals  Improve balance so he feels more comfortable walking, stand on one foot    Currently in Pain?  No/denies                        [facility] Adult PT  Treatment/Exercise - 07/02/19 0001      Self-Care   Self-Care  Other Self-Care Comments    Other Self-Care Comments   Balance progressions and incorporating golf specific balance, reasons for drop-foot and balance deficit related to nerves and weakness      Neuro Re-ed    Neuro Re-ed Details   Romberg stance on Airex, Romberg stance with EC on Airex and flat surface, 3/4 and full tandem stance with EO      Exercises   Exercises  Knee/Hip      Knee/Hip Exercises: Standing   Other Standing Knee Exercises  Lateral band walk with green band around knees x15, around mid-shin x15, around ankles x15      Knee/Hip Exercises: Sidelying   Hip ADduction  2 sets;10 reps    Clams  2x15 with green band             PT Education - 07/02/19 1537    Education Details  HEP    Person(s) Educated  Patient    Methods  Explanation;Demonstration;Verbal cues;Handout    Comprehension  Verbalized understanding;Returned demonstration;Verbal cues required;Need further instruction       PT Short Term Goals - 06/14/19 1409  PT SHORT TERM GOAL #1   Title  Patient will be I with HEP to progress strength and balance in therapy    Time  6    Period  Weeks    Status  New    Target Date  07/26/19      PT SHORT TERM GOAL #2   Title  Patient will exhibit improve strength of bilat gluteals to >/= 4+/5 MMT to improve balance and control    Time  6    Period  Weeks    Status  New    Target Date  07/26/19      PT SHORT TERM GOAL #3   Title  Patient will be able to maintain SLS for >/= 10 sec bilaterally to improve balance and stability    Time  6    Period  Weeks    Status  New    Target Date  07/26/19      PT SHORT TERM GOAL #4   Title  Patient will have reduced fall risk as demonstrated by >/= 55 on BERG balance test    Time  6    Period  Weeks    Status  New    Target Date  07/26/19      PT SHORT TERM GOAL #5   Title  Patient will exhibit improved left ankle DF to >/= 3+/5 MMT to  improve gait mechanics    Time  6    Period  Weeks    Status  New    Target Date  07/26/19        PT Long Term Goals - 06/15/19 0801      PT LONG TERM GOAL #1   Title  LTG = STC            Plan - 07/02/19 1538    Clinical Impression Statement  Patient tolerated therapy well with no adverse effects. Progressed hip abductor strengthening this visit and balance with good tolerance. He does continue to exhibit left > right hip and ankle weakness. He was instructed on balance progressions and ways to challenge or modify the exercises. Patient would benefit from continued skilled PT to progress strength and balance to reduce fall risk and allow patient to return to active lifestyle without fear of falling.    PT Treatment/Interventions  ADLs/Self Care Home Management;Cryotherapy;Electrical Stimulation;Moist Heat;Neuromuscular re-education;Balance training;Therapeutic exercise;Therapeutic activities;Functional mobility training;Stair training;Gait training;Patient/family education;Manual techniques;Dry needling;Passive range of motion;Taping;Joint Manipulations    PT Next Visit Plan  Assess HEP and progress PRN, progress hip and ankle strength, balance training, gait training    PT Home Exercise Plan  [email or web address]: clamshell with green, lateral band walk with green around knees, figure-4 bridge, romberg with EC, 3/4 and full tandem with EO    Consulted and Agree with Plan of Care  Patient       Patient will benefit from skilled therapeutic intervention in order to improve the following deficits and impairments:  Abnormal gait, Decreased strength, Decreased balance  Visit Diagnosis: Other abnormalities of gait and mobility  Muscle weakness (generalized)  Repeated falls  Chronic left-sided low back pain without sciatica  Abnormal posture     Problem List Patient Active Problem List   Diagnosis Date Noted  . S/P lumbar laminectomy 01/20/2018  . Essential tremor 11/02/2017  .  Unstable gait 11/02/2017  . Lumbar radiculopathy 07/19/2017  . Peripheral neuropathy 12/06/2016  . Hx of adenomatous colonic polyps 05/22/2014  . Exertional angina ([place]) 09/28/2011  . Left  inguinal hernia 09/10/2011  . PSA, INCREASED 08/05/2009  . CONGENITAL PIGMENTARY ANOMALY OF SKIN 07/25/2007  . Dyslipidemia 07/20/2006  . Coronary atherosclerosis 07/20/2006  . BPH associated with nocturia 07/20/2006    Hilda Blades, PT, DPT, LAT, ATC 07/02/19  5:06 PM Phone: [phone] Fax: [facility] [address] [place], [place], [address] Phone: [phone]   Fax:  [phone]  Name: Trevyon Swor Rahn MRN: [id number] Date of Birth: [birth year]     PHYSICAL THERAPY DISCHARGE SUMMARY  Visits from Start of Care: 2  Current functional level related to goals / functional outcomes: See above   Remaining deficits: See above   Education / Equipment: HEP Plan: Patient agrees to discharge.  Patient goals were not met. Patient is being discharged due to not returning since the last visit.  ?????    Hilda Blades, PT, DPT, LAT, ATC 08/01/19  8:28 AM Phone: [phone] Fax: [phone]

## 2019-07-02 NOTE — Patient Instructions (Signed)
Access Code: [email or web address] URL: [email or web address] Date: 07/02/2019 Prepared by: Hilda Blades  Exercises Clamshell with Resistance - 3-4 x weekly - 3 sets - 20 reps Figure 4 Bridge - 3-4 x weekly - 3 sets - 10 reps Side Stepping with Resistance at Thighs - 1 x daily - 4-5 x weekly - 3 sets - 20 reps Heel Toe Raises with Counter Support - 7 x weekly - 3 sets - 10 reps Romberg Stance with Eyes Closed - 2-3 x daily - 7 x weekly - 3 reps - 20-30 seconds hold Standing Romberg to 3/4 Tandem Stance - 2-3 x daily - 7 x weekly - 3 reps - 20-30 seconds hold Standing Tandem Balance with Counter Support - 2-3 x daily - 7 x weekly - 3 reps - 20-30 seconds hold

## 2019-07-09 ENCOUNTER — Ambulatory Visit: Payer: PPO | Admitting: Physical Therapy

## 2019-07-09 DIAGNOSIS — D225 Melanocytic nevi of trunk: Secondary | ICD-10-CM | POA: Diagnosis not present

## 2019-07-09 DIAGNOSIS — L578 Other skin changes due to chronic exposure to nonionizing radiation: Secondary | ICD-10-CM | POA: Diagnosis not present

## 2019-07-09 DIAGNOSIS — L814 Other melanin hyperpigmentation: Secondary | ICD-10-CM | POA: Diagnosis not present

## 2019-07-09 DIAGNOSIS — L570 Actinic keratosis: Secondary | ICD-10-CM | POA: Diagnosis not present

## 2019-07-09 DIAGNOSIS — Z85828 Personal history of other malignant neoplasm of skin: Secondary | ICD-10-CM | POA: Diagnosis not present

## 2019-07-09 DIAGNOSIS — L821 Other seborrheic keratosis: Secondary | ICD-10-CM | POA: Diagnosis not present

## 2019-07-16 ENCOUNTER — Ambulatory Visit: Payer: PPO | Admitting: Physical Therapy

## 2019-07-17 ENCOUNTER — Ambulatory Visit: Payer: PPO | Admitting: Family Medicine

## 2019-07-17 ENCOUNTER — Other Ambulatory Visit: Payer: Self-pay

## 2019-07-17 ENCOUNTER — Encounter: Payer: Self-pay | Admitting: Family Medicine

## 2019-07-17 VITALS — BP 118/78 | HR 62 | Ht 73.0 in | Wt 195.0 lb

## 2019-07-17 DIAGNOSIS — M5416 Radiculopathy, lumbar region: Secondary | ICD-10-CM

## 2019-07-17 DIAGNOSIS — G609 Hereditary and idiopathic neuropathy, unspecified: Secondary | ICD-10-CM | POA: Diagnosis not present

## 2019-07-17 DIAGNOSIS — Z9889 Other specified postprocedural states: Secondary | ICD-10-CM | POA: Diagnosis not present

## 2019-07-17 NOTE — Assessment & Plan Note (Signed)
Encouraged the gabapentin.  Patient did not want to try to increase dose at the moment.

## 2019-07-17 NOTE — Progress Notes (Signed)
[place] [place] Phone: [phone] Subjective:   Collin Collin [name], am serving as a scribe for Dr. Hulan Saas. This visit occurred during the SARS-CoV-2 public health emergency.  Safety protocols were in place, including screening questions prior to the visit, additional usage of staff PPE, and extensive cleaning of exam room while observing appropriate contact time as indicated for disinfecting solutions.   I'm seeing this patient by the request  of:  Martinique, Betty G, MD  CC: Low back pain follow-up  [id number]   5/101/2021 Patient does have a status post laminotomy.  Foot drop noted on the left side.  This is contributing to weakness and likely secondary to patient's fall.  Patient does have some wide-based gait that is concerning for potential early Parkinson's.  Has had an EMG previously that was unremarkable.  We will continue to monitor closely.  Discussed icing regimen, home exercise, which activities to do which wants to avoid.  Patient given gabapentin today that I think will be beneficial.  Discussed starting with balance and coordination physical therapy that I think will be helpful as well.  Follow-up again in 4 to 8 weeks  Update 07/17/2019 Collin Collin [name] is a 79 y.o. male coming in with complaint of low back pain. Patient states that he has been going to physical therapy. Is unsure if therapy is helping his balance. Continues to use gabapentin.  Patient states that overall may be being daily aches and pains has improved somewhat.  Still concern for the potential tremor in the difficulty with balance.  Was seen years ago for the possibility of Parkinson's and patient is still highly concerned.       Past Medical History:  Diagnosis Date  . Allergy   . Arthritis   . BPH (benign prostatic hyperplasia)   . CAD (coronary artery disease)   . Cataract   . Diverticulosis of colon   . Hx of adenomatous  colonic polyps 05/22/2014  . Hyperlipidemia   . Hypertension   . MYOCARDIAL INFARCTION, HX OF 07/20/2006   Qualifier: Diagnosis of  By: Collin Chang MD, Collin [name]     Past Surgical History:  Procedure Laterality Date  . CATARACT EXTRACTION     Bil  . COLONOSCOPY    . EYE SURGERY    . LUMBAR LAMINECTOMY/DECOMPRESSION MICRODISCECTOMY Left 01/20/2018   Procedure: Left Lumbar five Sacral one extraforaminal decompression;  Surgeon: Collin Moore, MD;  Location: [facility];  Service: Neurosurgery;  Laterality: Left;  . PTCA     Social History   Socioeconomic History  . Marital status: Married    Spouse name: Not on file  . Number of children: 3  . Years of education: 19  . Highest education level: Not on file  Occupational History  . Occupation: retired  Tobacco Use  . Smoking status: Never Smoker  . Smokeless tobacco: Never Used  Vaping Use  . Vaping Use: Never used  Substance and Sexual Activity  . Alcohol use: Yes    Alcohol/week: 1.0 standard drink    Types: 1 Glasses of wine per week    Comment: Occasional beer.  . Drug use: Collin [name]  . Sexual activity: Not on file  Other Topics Concern  . Not on file  Social History Narrative   Lives with wife in a 2 story home.  Has 3 children.     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    Education: college.  Occasional beer, Collin [name] smoking, Collin [name] drugs   Social Determinants of Health   Financial Resource Strain:   . Difficulty of Paying Living Expenses:   Food Insecurity:   . Worried About Charity fundraiser in the Last Year:   . Arboriculturist in the Last Year:   Transportation Needs:   . Film/video editor (Medical):   Marland Kitchen Lack of Transportation (Non-Medical):   Physical Activity:   . Days of Exercise per Week:   . Minutes of Exercise per Session:   Stress:   . Feeling of Stress :   Social Connections:   . Frequency of Communication with Friends and Family:   . Frequency of Social Gatherings with Friends and Family:     . Attends Religious Services:   . Active Member of Clubs or Organizations:   . Attends Archivist Meetings:   Marland Kitchen Marital Status:    Allergies  Allergen Reactions  . Penicillins Rash and Other (See Comments)    PATIENT HAS HAD A PCN REACTION WITH IMMEDIATE RASH, FACIAL/TONGUE/THROAT SWELLING, SOB, OR LIGHTHEADEDNESS WITH HYPOTENSION:  #  #  YES  #  #  Has patient had a PCN reaction causing severe rash involving mucus membranes or skin necrosis: Collin [name] Has patient had a PCN reaction that required hospitalization: Collin [name] Has patient had a PCN reaction occurring within the last 10 years: Collin [name] If all of the above answers are "Collin [name]", then may proceed with Cephalosporin use.   Family History  Problem Relation Age of Onset  . Heart disease Mother        CABG  . Stroke Father   . Kidney disease Father        renal failure  . AAA (abdominal aortic aneurysm) Father   . Hypertension Sister   . Diabetes Brother   . AAA (abdominal aortic aneurysm) Paternal Uncle   . Colon cancer Neg Hx      Current Outpatient Medications (Cardiovascular):  .  metoprolol succinate (TOPROL-XL) 25 MG 24 hr tablet, Take 1 tablet (25 mg total) by mouth daily. .  nitroGLYCERIN (NITROSTAT) 0.4 MG SL tablet, Place 1 tablet (0.4 mg total) under the tongue every 5 (five) minutes as needed for chest pain. .  simvastatin (ZOCOR) 40 MG tablet, Take 1 tablet (40 mg total) by mouth daily at 6 PM.   Current Outpatient Medications (Analgesics):  .  aspirin 81 MG tablet, Take 1 tablet (81 mg total) by mouth at bedtime.  Current Outpatient Medications (Hematological):  .  vitamin B-12 (CYANOCOBALAMIN) 1000 MCG tablet, Take 1,000 mcg by mouth daily.  Current Outpatient Medications (Other):  Marland Kitchen  Bioflavonoid Products (GRAPE SEED PO)*, Take 1 capsule by mouth daily. .  Cholecalciferol (VITAMIN D3) 125 MCG (5000 UT) CAPS, Take 5,000 Units by mouth at bedtime. .  CHOLINE PO, Take 250 mg by mouth at bedtime. .  Coenzyme Q10 (EQL  COQ10) 300 MG CAPS, Take 300 mg by mouth daily. .  fish oil-omega-3 fatty acids 1000 MG capsule, Take 1 [name] by mouth daily.  Marland Kitchen  gabapentin (NEURONTIN) 100 MG capsule, Take 2 capsules (200 mg total) by mouth at bedtime. .  Glucosamine-Chondroitin (MOVE FREE PO), Take 1 tablet by mouth daily.   .  Multiple Vitamin (MULTIVITAMIN) tablet, Take 1 tablet by mouth daily.   Marland Kitchen  pyridOXINE (VITAMIN B-6) 100 MG tablet, Take 100 mg by mouth daily. .  Saw Palmetto 450 MG CAPS, Take 450 mg by mouth at bedtime. * These  medications belong to multiple therapeutic classes and are listed under each applicable group.   Reviewed prior external information including notes and imaging from  primary care provider As well as notes that were available from care everywhere and other healthcare systems.  Past medical history, social, surgical and family history all reviewed in electronic medical record.  Collin [name] pertanent information unless stated regarding to the chief complaint.   Review of Systems:  Collin [name] headache, visual changes, nausea, vomiting, diarrhea, constipation, dizziness, abdominal pain, skin rash, fevers, chills, night sweats, weight loss, swollen lymph nodes,  joint swelling, chest pain, shortness of breath, mood changes. POSITIVE muscle aches, body aches  Objective  Blood pressure 118/78, pulse 62, height 6\' 1"  (1.854 m), weight 195 lb (88.5 kg), SpO2 95 %.   General: Collin [name] apparent distress alert and oriented x3 mood and affect normal, dressed appropriately.  Possibly mild masked facies noted HEENT: Pupils equal, extraocular movements intact  Respiratory: Patient's speak in full sentences and does not appear short of breath  Cardiovascular: Collin [name] lower extremity edema, non tender, Collin [name] erythema  Neuro: Mild neuropathy noted..  Gait moderately antalgic.  Mild wide-based gait Musculoskeletal exam has some very mild hypertonicity but Collin [name] significant cogwheeling noted.  Patient has decent range of motion of the  extremities noted with mild arthritic changes.  Low back exam does have loss of lordosis with worsening extension noted.  Continued weakness noted with foot drop on the left side.   Impression and Recommendations:     The above documentation has been reviewed and is accurate and complete Lyndal Pulley, DO       Note: This dictation was prepared with Dragon dictation along with smaller phrase technology. Any transcriptional errors that result from this process are unintentional.

## 2019-07-17 NOTE — Assessment & Plan Note (Addendum)
I do believe that patient does have some worsening facet arthropathy status post laminotomy.  Likely contributing to some of the pain.  Patient also has what appears to be more of a peripheral neuropathy that is more concerning for patient's balance and coordination.  I do believe that likely some of patient's balance and coordination is more secondary to this.  Once again has EMG consistent with a mix of peripheral neuropathy as well as an L5 radiculopathy from 2018.    We reviewed this as well as patient's previous imagings.  Patient is still concerned for the mild essential tremor and the potential for Parkinson's and we did discuss the potential for referral to neurology again to be further evaluated.  Patient agreed to consider it   Total time with patient reviewing chart and discussing with him 33 minutes.

## 2019-07-17 NOTE — Patient Instructions (Signed)
I will check in with Dr. Carles Collet Do PT as much as you feel Continue gabapentin Tart Cherry Extract 1200mg  at night See me in 3 months

## 2019-07-23 ENCOUNTER — Ambulatory Visit: Payer: PPO | Admitting: Physical Therapy

## 2019-09-28 ENCOUNTER — Telehealth: Payer: Self-pay | Admitting: Family Medicine

## 2019-09-28 DIAGNOSIS — E785 Hyperlipidemia, unspecified: Secondary | ICD-10-CM

## 2019-09-28 DIAGNOSIS — R748 Abnormal levels of other serum enzymes: Secondary | ICD-10-CM

## 2019-09-28 NOTE — Telephone Encounter (Signed)
Pt called asking if labs could be ordered prior to his visit in October.  I told him I would ask Dr. Martinique about what labs she would want to order.  Also instructed patient that labs need to be associated with a diagnosis, and that Dr. Martinique would have a better idea about what labs she wanted to order after the visit.

## 2019-09-28 NOTE — Telephone Encounter (Signed)
Pt  Called to say he spoke with his insurance company and they advise him that labs depends on how the office codes it???  He wants blood work done prior to his appointment 10/15.Marland KitchenMarland Kitchen

## 2019-09-28 NOTE — Telephone Encounter (Signed)
Okay to order labs prior to pt's CPE? He's scheduled for 2:30 in the afternoon, so may be hard for him to fast that long.

## 2019-10-02 NOTE — Telephone Encounter (Signed)
He had blood work done in 05/2019, ordered by DR Tamala Julian. He has Dx of dyslipidemia and his glucose was elevated, so his insurance may cover for FLP and HgA1C. Also in 05/2019 one of his liver enz was mildly elevated, so LFT's can be added. Thanks, BJ

## 2019-10-03 NOTE — Telephone Encounter (Signed)
LVM

## 2019-10-03 NOTE — Addendum Note (Signed)
Addended by: Rodrigo Ran on: 10/03/2019 10:11 AM   Modules accepted: Orders

## 2019-10-03 NOTE — Telephone Encounter (Signed)
Okay to schedule lab appt before physical. Orders are in, thanks!

## 2019-10-05 NOTE — Telephone Encounter (Signed)
2nd LVM

## 2019-10-15 ENCOUNTER — Ambulatory Visit: Payer: PPO | Admitting: Family Medicine

## 2019-10-22 DIAGNOSIS — H903 Sensorineural hearing loss, bilateral: Secondary | ICD-10-CM | POA: Diagnosis not present

## 2019-10-29 NOTE — Addendum Note (Signed)
Addended by: Marrion Coy on: 10/29/2019 03:23 PM   Modules accepted: Orders

## 2019-10-30 ENCOUNTER — Ambulatory Visit: Payer: PPO | Attending: Internal Medicine

## 2019-10-30 DIAGNOSIS — Z23 Encounter for immunization: Secondary | ICD-10-CM

## 2019-10-30 NOTE — Progress Notes (Signed)
   [facility]ov[facility] Vaccination [facility]  Name:  Collin [name]    MRN: [id number] DOB: 1940-11-23  10/30/2019  Mr. Dawe was observed post Covid-19 immunization for 15 minutes without incident. He was provided with Vaccine Information Sheet and instruction to access the V-Safe system.   Mr. Hands was instructed to call 911 with any severe reactions post vaccine: Marland Kitchen Difficulty breathing  . Swelling of face and throat  . A fast heartbeat  . A bad rash all over body  . Dizziness and weakness

## 2019-11-02 ENCOUNTER — Other Ambulatory Visit: Payer: Self-pay

## 2019-11-05 ENCOUNTER — Other Ambulatory Visit: Payer: Self-pay

## 2019-11-05 ENCOUNTER — Other Ambulatory Visit (INDEPENDENT_AMBULATORY_CARE_PROVIDER_SITE_OTHER): Payer: PPO

## 2019-11-05 DIAGNOSIS — E785 Hyperlipidemia, unspecified: Secondary | ICD-10-CM | POA: Diagnosis not present

## 2019-11-05 DIAGNOSIS — Z23 Encounter for immunization: Secondary | ICD-10-CM | POA: Diagnosis not present

## 2019-11-05 DIAGNOSIS — R748 Abnormal levels of other serum enzymes: Secondary | ICD-10-CM | POA: Diagnosis not present

## 2019-11-05 DIAGNOSIS — H903 Sensorineural hearing loss, bilateral: Secondary | ICD-10-CM | POA: Diagnosis not present

## 2019-11-06 LAB — HEPATIC FUNCTION PANEL
AG Ratio: 1.6 (calc) (ref 1.0–2.5)
ALT: 27 U/L (ref 9–46)
AST: 32 U/L (ref 10–35)
Albumin: 4.3 g/dL (ref 3.6–5.1)
Alkaline phosphatase (APISO): 47 U/L (ref 35–144)
Bilirubin, Direct: 0.2 mg/dL (ref 0.0–0.2)
Globulin: 2.7 g/dL (calc) (ref 1.9–3.7)
Indirect Bilirubin: 0.7 mg/dL (calc) (ref 0.2–1.2)
Total Bilirubin: 0.9 mg/dL (ref 0.2–1.2)
Total Protein: 7 g/dL (ref 6.1–8.1)

## 2019-11-06 LAB — HEMOGLOBIN A1C
Hgb A1c MFr Bld: 5.9 % of total Hgb — ABNORMAL HIGH (ref <5.7)
Mean Plasma Glucose: 123 (calc)
eAG (mmol/L): 6.8 (calc)

## 2019-11-06 LAB — LIPID PANEL
Cholesterol: 130 mg/dL (ref <200)
HDL: 59 mg/dL (ref >=40)
LDL Cholesterol (Calc): 54 mg/dL (calc)
Non-HDL Cholesterol (Calc): 71 mg/dL (calc) (ref <130)
Total CHOL/HDL Ratio: 2.2 (calc) (ref <5.0)
Triglycerides: 87 mg/dL (ref <150)

## 2019-11-09 ENCOUNTER — Encounter: Payer: Self-pay | Admitting: Family Medicine

## 2019-11-09 ENCOUNTER — Other Ambulatory Visit: Payer: Self-pay

## 2019-11-09 ENCOUNTER — Ambulatory Visit (INDEPENDENT_AMBULATORY_CARE_PROVIDER_SITE_OTHER): Payer: PPO | Admitting: Family Medicine

## 2019-11-09 VITALS — BP 124/80 | HR 66 | Resp 19 | Ht 73.0 in | Wt 190.0 lb

## 2019-11-09 DIAGNOSIS — R2681 Unsteadiness on feet: Secondary | ICD-10-CM

## 2019-11-09 DIAGNOSIS — N401 Enlarged prostate with lower urinary tract symptoms: Secondary | ICD-10-CM | POA: Diagnosis not present

## 2019-11-09 DIAGNOSIS — G609 Hereditary and idiopathic neuropathy, unspecified: Secondary | ICD-10-CM | POA: Diagnosis not present

## 2019-11-09 DIAGNOSIS — R351 Nocturia: Secondary | ICD-10-CM | POA: Diagnosis not present

## 2019-11-09 DIAGNOSIS — R7303 Prediabetes: Secondary | ICD-10-CM | POA: Diagnosis not present

## 2019-11-09 DIAGNOSIS — G250 Essential tremor: Secondary | ICD-10-CM | POA: Diagnosis not present

## 2019-11-09 DIAGNOSIS — Z0000 Encounter for general adult medical examination without abnormal findings: Secondary | ICD-10-CM

## 2019-11-09 DIAGNOSIS — E785 Hyperlipidemia, unspecified: Secondary | ICD-10-CM

## 2019-11-09 MED ORDER — TAMSULOSIN HCL 0.4 MG PO CAPS: 0.4000 mg | ORAL_CAPSULE | Freq: Every day | ORAL | 3 refills | Status: DC

## 2019-11-09 NOTE — Progress Notes (Signed)
HPI:  [name] is a pleasant 79 y.o.male here today for his routine physical examination. He also has a few concerns he would like to discussed.  Last CPE: 11/07/18 He lives with his wife.  Regular exercise 3 or more times per week: Walks 3 miles Saturday, gyn M-Tues,thurs,Friday elliptical 30 min (HR 120/min),wt lifting.  Wednesday he rests.  Following a healthy diet: Yes, ice cream "often."  Chronic medical problems: Sensorineural hearing loss, CAD, lower back pain, OA,peripheral neuropathy among some. Balance issues,did not improved after PT. 5 months ago he felt when walking. Tripped with left foot.  Immunization History  Administered Date(s) Administered  . Fluad Quad(high Dose 65+) 11/05/2019  . Influenza Whole 12/04/2008, 01/20/2010, 07/26/2010  . Influenza, High Dose Seasonal PF 10/16/2015, 11/02/2017  . Influenza,inj,Quad PF,6+ Mos 11/20/2012, 11/20/2013, 10/08/2014  . Influenza-Unspecified 11/17/2016  . PFIZER SARS-COV-2 Vaccination 03/02/2019, 03/27/2019, 10/30/2019  . Pneumococcal Conjugate-13 11/20/2013  . Pneumococcal Polysaccharide-23 07/20/2006  . Td 07/20/2006  . Zoster 09/09/2010   Last colon cancer screening: 05/14/14. Last prostate ca screening:  Lab Results  Component Value Date   PSA 5.36 (H) 10/03/2014   PSA 4.83 (H) 09/25/2013   PSA 4.02 (H) 09/06/2012   Negative for high alcohol intake and  tobacco use.   -Concerns and/or follow up today:   HLD: He is on Simvastatin 40 mg daily. Tolerating medication well.  Lab Results  Component Value Date   CHOL 130 11/05/2019   HDL 59 11/05/2019   LDLCALC 54 11/05/2019   TRIG 87 11/05/2019   CHOLHDL 2.2 11/05/2019   Prediabetes: Negative for polydipsia,polyuria, or polyphagia.  Lab Results  Component Value Date   HGBA1C 5.9 (H) 11/05/2019   Peripheral neuropathy: Started on Gabapentin, he has not noted difference. He is on Gabapentin 100 mg daily. He has not noted skin  lesions.  Balance problems: PT did not help much. He has not had falls in the past year.  Tremor: Has been going on for years. Otherwise stable. Affecting hands, problem exacerbated by holding objects, writing, and other manual activities. It is not affecting ADL's. He has not tried meds in the past.  He takes Metoprolol succinate 25 mg daily for CAD. He has not noted CP,SOB,palpitations,orthopnea,or PND. Follows with Dr Burt Knack.  Nocturia x at least 3. Urine frequency, worse if he drinks fluids. Flomax did not help. He saw urologist many years ago. Difficulty findings words, 2-3 years.  Lab Results  Component Value Date   CREATININE 1.15 06/04/2019   BUN 21 06/04/2019   NA 136 06/04/2019   K 4.6 06/04/2019   CL 102 06/04/2019   CO2 27 06/04/2019    Review of Systems  Constitutional: Negative for activity change, appetite change, fatigue and fever.  HENT: Negative for mouth sores, nosebleeds and sore throat.   Eyes: Negative for redness and visual disturbance.  Respiratory: Negative for cough and wheezing.   Cardiovascular: Negative for leg swelling.  Gastrointestinal: Negative for abdominal pain, nausea and vomiting.  Endocrine: Negative for cold intolerance, heat intolerance, polydipsia, polyphagia and polyuria.  Musculoskeletal: Positive for arthralgias and gait problem. Negative for myalgias.  Skin: Negative for pallor and rash.  Allergic/Immunologic: Positive for environmental allergies.  Neurological: Positive for tremors. Negative for syncope, facial asymmetry, speech difficulty, weakness and headaches.  Psychiatric/Behavioral: Negative for confusion.  All other systems reviewed and are negative.  Current Outpatient Medications on File Prior to Visit  Medication Sig Dispense Refill  . aspirin 81 MG tablet Take  1 tablet (81 mg total) by mouth at bedtime. 30 tablet   . Bioflavonoid Products (GRAPE SEED PO) Take 1 capsule by mouth daily.    . Cholecalciferol  (VITAMIN D3) 125 MCG (5000 UT) CAPS Take 5,000 Units by mouth at bedtime.    . CHOLINE PO Take 250 mg by mouth at bedtime.    . Coenzyme Q10 (EQL COQ10) 300 MG CAPS Take 300 mg by mouth daily.    . fish oil-omega-3 fatty acids 1000 MG capsule Take 1 g by mouth daily.     Marland Kitchen gabapentin (NEURONTIN) 100 MG capsule Take 2 capsules (200 mg total) by mouth at bedtime. 180 capsule 3  . Glucosamine-Chondroitin (MOVE FREE PO) Take 1 tablet by mouth daily.      . metoprolol succinate (TOPROL-XL) 25 MG 24 hr tablet Take 1 tablet (25 mg total) by mouth daily. 90 tablet 3  . Multiple Vitamin (MULTIVITAMIN) tablet Take 1 tablet by mouth daily.      . nitroGLYCERIN (NITROSTAT) 0.4 MG SL tablet Place 1 tablet (0.4 mg total) under the tongue every 5 (five) minutes as needed for chest pain. 25 tablet 3  . pyridOXINE (VITAMIN B-6) 100 MG tablet Take 100 mg by mouth daily.    . Saw Palmetto 450 MG CAPS Take 450 mg by mouth at bedtime.    . simvastatin (ZOCOR) 40 MG tablet Take 1 tablet (40 mg total) by mouth daily at 6 PM. 90 tablet 3  . vitamin B-12 (CYANOCOBALAMIN) 1000 MCG tablet Take 1,000 mcg by mouth daily.     No current facility-administered medications on file prior to visit.   Past Medical History:  Diagnosis Date  . Allergy   . Arthritis   . BPH (benign prostatic hyperplasia)   . CAD (coronary artery disease)   . Cataract   . Diverticulosis of colon   . Hx of adenomatous colonic polyps 05/22/2014  . Hyperlipidemia   . Hypertension   . MYOCARDIAL INFARCTION, HX OF 07/20/2006   Qualifier: Diagnosis of  By: Leanne Chang MD, Bruce      Past Surgical History:  Procedure Laterality Date  . CATARACT EXTRACTION     Bil  . COLONOSCOPY    . EYE SURGERY    . LUMBAR LAMINECTOMY/DECOMPRESSION MICRODISCECTOMY Left 01/20/2018   Procedure: Left Lumbar five Sacral one extraforaminal decompression;  Surgeon: Eustace Moore, MD;  Location: [facility];  Service: Neurosurgery;  Laterality: Left;  . PTCA      Allergies   Allergen Reactions  . Penicillins Rash and Other (See Comments)    PATIENT HAS HAD A PCN REACTION WITH IMMEDIATE RASH, FACIAL/TONGUE/THROAT SWELLING, SOB, OR LIGHTHEADEDNESS WITH HYPOTENSION:  #  #  YES  #  #  Has patient had a PCN reaction causing severe rash involving mucus membranes or skin necrosis: No Has patient had a PCN reaction that required hospitalization: No Has patient had a PCN reaction occurring within the last 10 years: No If all of the above answers are "NO", then may proceed with Cephalosporin use.    Family History  Problem Relation Age of Onset  . Heart disease Mother        CABG  . Stroke Father   . Kidney disease Father        renal failure  . AAA (abdominal aortic aneurysm) Father   . Hypertension Sister   . Diabetes Brother   . AAA (abdominal aortic aneurysm) Paternal Uncle   . Colon cancer Neg Hx  Social History   Socioeconomic History  . Marital status: Married    Spouse name: Not on file  . Number of children: 3  . Years of education: 33  . Highest education level: Not on file  Occupational History  . Occupation: retired  Tobacco Use  . Smoking status: Never Smoker  . Smokeless tobacco: Never Used  Vaping Use  . Vaping Use: Never used  Substance and Sexual Activity  . Alcohol use: Yes    Alcohol/week: 1.0 standard drink    Types: 1 Glasses of wine per week    Comment: Occasional beer.  . Drug use: No  . Sexual activity: Not on file  Other Topics Concern  . Not on file  Social History Narrative   Lives with wife in a 2 story home.  Has 3 children.     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    Education: college.    Occasional beer, no smoking, no drugs   Social Determinants of Health   Financial Resource Strain:   . Difficulty of Paying Living Expenses: Not on file  Food Insecurity:   . Worried About Charity fundraiser in the Last Year: Not on file  . Ran Out of Food in the Last Year: Not on file   Transportation Needs:   . Lack of Transportation (Medical): Not on file  . Lack of Transportation (Non-Medical): Not on file  Physical Activity:   . Days of Exercise per Week: Not on file  . Minutes of Exercise per Session: Not on file  Stress:   . Feeling of Stress : Not on file  Social Connections:   . Frequency of Communication with Friends and Family: Not on file  . Frequency of Social Gatherings with Friends and Family: Not on file  . Attends Religious Services: Not on file  . Active Member of Clubs or Organizations: Not on file  . Attends Archivist Meetings: Not on file  . Marital Status: Not on file   Vitals:   11/09/19 1418  BP: 124/80  Pulse: 66  Resp: 19  SpO2: 95%   Body mass index is 25.07 kg/m.  Wt Readings from Last 3 Encounters:  11/09/19 190 lb (86.2 kg)  07/17/19 195 lb (88.5 kg)  06/04/19 194 lb (88 kg)   Physical Exam Vitals and nursing note reviewed.  Constitutional:      General: He is not in acute distress.    Appearance: He is well-developed.  HENT:     Head: Normocephalic and atraumatic.     Ears:     Comments: Hearing aids in place, bilateral.    Mouth/Throat:     Mouth: Mucous membranes are moist.     Pharynx: Oropharynx is clear.  Eyes:     Conjunctiva/sclera: Conjunctivae normal.     Pupils: Pupils are equal, round, and reactive to light.  Neck:     Thyroid: No thyromegaly.     Trachea: No tracheal deviation.  Cardiovascular:     Rate and Rhythm: Normal rate and regular rhythm.     Pulses:          Dorsalis pedis pulses are 2+ on the right side and 2+ on the left side.     Heart sounds: No murmur heard.   Pulmonary:     Effort: Pulmonary effort is normal. No respiratory distress.     Breath sounds: Normal breath sounds.  Abdominal:     Palpations: Abdomen is soft. There  is no hepatomegaly or mass.     Tenderness: There is no abdominal tenderness.  Musculoskeletal:        General: No tenderness.     Cervical back:  Normal range of motion.     Comments: No major deformities appreciated and no signs of synovitis.  Lymphadenopathy:     Cervical: No cervical adenopathy.  Skin:    General: Skin is warm.     Findings: No erythema.  Neurological:     Mental Status: He is alert and oriented to person, place, and time.     Cranial Nerves: No cranial nerve deficit.     Sensory: No sensory deficit.     Motor: Tremor (Mild, hands, not present at rest.) present. No abnormal muscle tone or pronator drift.     Coordination: Coordination normal.     Deep Tendon Reflexes:     Reflex Scores:      Bicep reflexes are 2+ on the right side and 2+ on the left side.      Patellar reflexes are 2+ on the right side and 2+ on the left side.    Comments: Gait mildly unstable, not assisted.  Psychiatric:        Mood and Affect: Mood and affect normal.    ASSESSMENT AND PLAN:  Mr.Vue was seen today for annual exam.  Diagnoses and all orders for this visit:  Routine general medical examination at a health care facility We discussed the importance of regular physical activity and healthy diet for prevention of chronic illness and/or complications. Preventive guidelines reviewed. Vaccination up to date.  Next CPE in a year.  Dyslipidemia LDL at goal. Continue Simvastatin 40 mg daily. Low fat diet is also recommended.  BPH associated with nocturia We reviewed prior PSA results and current recommendations in regard to prostate cancer screening. He prefers to hold on repeating PSA. He would like to try Flomax again, side effects discussed. We can add Proscar if symptoms are not better.  -     tamsulosin (FLOMAX) 0.4 MG CAPS capsule; Take 1 capsule (0.4 mg total) by mouth daily.  Idiopathic peripheral neuropathy Stable. Recommend increasing dose of Gabapentin , he can titrate up to 300 mg as tolerated. Side effects discussed. Falkl precautions. Good foot care.  Essential tremor Educated about  Dx,prognosis,and treatment options. Taking Metoprolol succinate for CAD. For now I do not recommend adding medications.  Continue monitoring for changes.  Unstable gait Fall precautions. PT did not help. Some of his medications and chronic medical problems could be contributing factors. We discussed some side effects of meds.  Prediabetes Healthy life style for primary prevention.  Return in 6 months (on 05/09/2020) for BPH, neuropathy.   Athel Merriweather G. Martin[place], MD  [facility]. [place] office. A few things to remember from today's visit:  Routine general medical examination at a health care facility  Dyslipidemia  BPH associated with nocturia  Idiopathic peripheral neuropathy  Essential tremor  Unstable gait  Flomax started today. You could try increasing dose of Gabapentin up to 3 caps at bedtime. If not helping,you can stop it.  If you need refills please call your pharmacy. Do not use My Chart to request refills or for acute issues that need immediate attention.    Please be sure medication list is accurate. If a new problem present, please set up appointment sooner than planned today. A few tips:  -As we age balance is not as good as it was, so there is a  higher risks for falls. Please remove small rugs and furniture that is "in your way" and could increase the risk of falls. Stretching exercises may help with fall prevention: Yoga and Tai Chi are some examples. Low impact exercise is better, so you are not very achy the next day.  -Sun screen and avoidance of direct sun light recommended. Caution with dehydration, if working outdoors be sure to drink enough fluids.  - Some medications are not safe as we age, increases the risk of side effects and can potentially interact with other medication you are also taken;  including some of over the counter medications. Be sure to let me know when you start a new medication even if it is a dietary/vitamin  supplement.   -Healthy diet low in red meet/animal fat and sugar + regular physical activity is recommended.

## 2019-11-09 NOTE — Assessment & Plan Note (Addendum)
Educated about Dx,prognosis,and treatment options. Taking Metoprolol succinate for CAD. For now I do not recommend adding medications.  Continue monitoring for changes.

## 2019-11-09 NOTE — Assessment & Plan Note (Addendum)
Fall precautions. PT did not help. Some of his medications and chronic medical problems could be contributing factors. We discussed some side effects of meds.

## 2019-11-09 NOTE — Patient Instructions (Addendum)
A few things to remember from today's visit:  Routine general medical examination at a health care facility  Dyslipidemia  BPH associated with nocturia  Idiopathic peripheral neuropathy  Essential tremor  Unstable gait  Flomax started today. You could try increasing dose of Gabapentin up to 3 caps at bedtime. If not helping,you can stop it.  If you need refills please call your pharmacy. Do not use My Chart to request refills or for acute issues that need immediate attention.    Please be sure medication list is accurate. If a new problem present, please set up appointment sooner than planned today. A few tips:  -As we age balance is not as good as it was, so there is a higher risks for falls. Please remove small rugs and furniture that is "in your way" and could increase the risk of falls. Stretching exercises may help with fall prevention: Yoga and Tai Chi are some examples. Low impact exercise is better, so you are not very achy the next day.  -Sun screen and avoidance of direct sun light recommended. Caution with dehydration, if working outdoors be sure to drink enough fluids.  - Some medications are not safe as we age, increases the risk of side effects and can potentially interact with other medication you are also taken;  including some of over the counter medications. Be sure to let me know when you start a new medication even if it is a dietary/vitamin supplement.   -Healthy diet low in red meet/animal fat and sugar + regular physical activity is recommended.

## 2019-12-26 ENCOUNTER — Telehealth: Payer: Self-pay | Admitting: Family Medicine

## 2019-12-26 NOTE — Telephone Encounter (Signed)
Left message for patient to call back and schedule Medicare Annual Wellness Visit (AWV) either virtually or in office.   Last AWV 11/07/2018 ; please schedule at anytime with [facility] Nurse Health Advisor 1 or 2   This should be a 45 minute visit.

## 2019-12-31 ENCOUNTER — Telehealth: Payer: Self-pay | Admitting: Cardiovascular Disease

## 2019-12-31 NOTE — Telephone Encounter (Signed)
Left message for patient that he will be called in the AM to discuss symptoms and confirm appointment.  Appointment is being held with Richardson Dopp tomorrow afternoon.  Will call patient in the morning to discuss.

## 2019-12-31 NOTE — Telephone Encounter (Signed)
Pt c/o Shortness Of Breath: STAT if SOB developed within the last 24 hours or pt is noticeably SOB on the phone  1. Are you currently SOB (can you hear that pt is SOB on the phone)? No   2. How long have you been experiencing SOB? 2 days   3. Are you SOB when sitting or when up moving around? Moving around   4. Are you currently experiencing any other symptoms? lightheaded when standing up   Collin [name] is calling stating he has noticed a change in his SOB. He states for the past two days he is excperiencing SOB upon exertion. Please advise.

## 2020-01-01 ENCOUNTER — Ambulatory Visit: Payer: PPO

## 2020-01-01 ENCOUNTER — Ambulatory Visit (INDEPENDENT_AMBULATORY_CARE_PROVIDER_SITE_OTHER): Payer: PPO | Admitting: Physician Assistant

## 2020-01-01 ENCOUNTER — Encounter: Payer: Self-pay | Admitting: Physician Assistant

## 2020-01-01 ENCOUNTER — Other Ambulatory Visit: Payer: Self-pay

## 2020-01-01 VITALS — BP 132/80 | HR 63 | Ht 73.0 in | Wt 195.6 lb

## 2020-01-01 DIAGNOSIS — I25119 Atherosclerotic heart disease of native coronary artery with unspecified angina pectoris: Secondary | ICD-10-CM | POA: Diagnosis not present

## 2020-01-01 DIAGNOSIS — R0602 Shortness of breath: Secondary | ICD-10-CM

## 2020-01-01 DIAGNOSIS — E782 Mixed hyperlipidemia: Secondary | ICD-10-CM

## 2020-01-01 NOTE — Progress Notes (Signed)
Cardiology Office Note:    Date:  01/01/2020   ID:  Collin [name], DOB Jul 17, 1940, MRN [id number]  PCP:  Martinique, Betty G, MD  [facility] Cardiologist:  Sherren Mocha, MD   [place] Electrophysiologist:  None   Referring MD: Martinique, Betty G, MD   Chief Complaint:  Shortness of Breath    Patient Profile:    Collin [name] is a 79 y.o. male with:   Coronary artery disease   S/p remote inferior MI in 1998 tx with POBA to RCA   Mid LAD disease tx medically (beta-blocker)  Cath 9/13: mLAD 80; oD1 80, pD2 80; mLCx 50, OM1 50-60; mRCA 30  Lumbar disc dz  S/p L5-S1 microdiscectomy 12/19  Hypertension   Hyperlipidemia   BPH  Prior CV studies:   AAA [place] 08/21/13 No abdominal aortic aneurysm  Cardiac catheterization 10/20/2011 LM no obstructive disease LAD proximal and mid heavy calcification, mid 20, mid-distal 55; D1 ostial 80, D2 proximal 84 LCx mid 21; both branches of OM1 50-60 RCA mid 30 EF 55-65  Myoview 09/21/2011 EF 56, small region of apical anterior/anteroseptal ischemia  Myoview 08/11/2005 EF, no ischemia  History of Present Illness:    [name] was last seen by Dr. Burt Knack in 12/19 for preoperative assessment prior to back surgery.  He called in yesterday with symptoms of shortness of breath and is seen for further evaluation. He is here alone. Over the past 1-2 weeks, he has noted shortness of breath with some activities. He has noted some shortness of breath with bending over and getting lightheaded when he stands up. However, he has been able to ride the elliptical at the gym without shortness of breath. He has not had chest discomfort, arm or jaw discomfort or back discomfort. He has not had orthopnea, PND, significant leg swelling. He has not had syncope.  Over the past couple of days, he feels back to normal.   Past Medical History:  Diagnosis Date  . Allergy   . Arthritis   . BPH (benign prostatic hyperplasia)   . CAD (coronary artery  disease)   . Cataract   . Diverticulosis of colon   . Hx of adenomatous colonic polyps 05/22/2014  . Hyperlipidemia   . Hypertension   . MYOCARDIAL INFARCTION, HX OF 07/20/2006   Qualifier: Diagnosis of  By: Leanne Chang MD, Bruce      Current Medications: Current Meds  Medication Sig  . aspirin 81 MG tablet Take 1 tablet (81 mg total) by mouth at bedtime.  Marland Kitchen Bioflavonoid Products (GRAPE SEED PO) Take 1 capsule by mouth daily.  . Cholecalciferol (VITAMIN D3) 125 MCG (5000 UT) CAPS Take 5,000 Units by mouth at bedtime.  . Coenzyme Q10 (EQL COQ10) 300 MG CAPS Take 300 mg by mouth daily.  . fish oil-omega-3 fatty acids 1000 MG capsule Take 1 g by mouth daily.   . Glucosamine-Chondroitin (MOVE FREE PO) Take 1 tablet by mouth daily.    . metoprolol succinate (TOPROL-XL) 25 MG 24 hr tablet Take 1 tablet (25 mg total) by mouth daily.  . Multiple Vitamin (MULTIVITAMIN) tablet Take 1 tablet by mouth daily.    . nitroGLYCERIN (NITROSTAT) 0.4 MG SL tablet Place 1 tablet (0.4 mg total) under the tongue every 5 (five) minutes as needed for chest pain.  . Saw Palmetto 450 MG CAPS Take 450 mg by mouth at bedtime.  . simvastatin (ZOCOR) 40 MG tablet Take 1 tablet (40 mg total) by mouth daily at 6  PM.  . tamsulosin (FLOMAX) 0.4 MG CAPS capsule Take 1 capsule (0.4 mg total) by mouth daily.  . vitamin B-12 (CYANOCOBALAMIN) 1000 MCG tablet Take 1,000 mcg by mouth daily.     Allergies:   Penicillins   Social History   Tobacco Use  . Smoking status: Never Smoker  . Smokeless tobacco: Never Used  Vaping Use  . Vaping Use: Never used  Substance Use Topics  . Alcohol use: Yes    Alcohol/week: 1.0 standard drink    Types: 1 Glasses of wine per week    Comment: Occasional beer.  . Drug use: No     Family Hx: The patient's family history includes AAA (abdominal aortic aneurysm) in his father and paternal uncle; Diabetes in his brother; Heart disease in his mother; Hypertension in his sister; Kidney disease  in his father; Stroke in his father. There is no history of Colon cancer.  Review of Systems  Constitutional: Negative for fever.  Respiratory: Negative for cough and hemoptysis.   Gastrointestinal: Negative for hematochezia and melena.  Genitourinary: Negative for hematuria.     EKGs/Labs/Other Test Reviewed:    EKG:  EKG is   ordered today.  The ekg ordered today demonstrates normal sinus rhythm, heart rate 60, first-degree AV block, normal axis, no ST-T wave changes, QTC 398  Recent Labs: 06/04/2019: BUN 21; Creatinine, Ser 1.15; Hemoglobin 15.3; Platelets 201.0; Potassium 4.6; Sodium 136; TSH 1.98 11/05/2019: ALT 27   Recent Lipid Panel Lab Results  Component Value Date/Time   CHOL 130 11/05/2019 08:28 AM   TRIG 87 11/05/2019 08:28 AM   HDL 59 11/05/2019 08:28 AM   CHOLHDL 2.2 11/05/2019 08:28 AM   LDLCALC 54 11/05/2019 08:28 AM      Risk Assessment/Calculations:      Physical Exam:    VS:  BP 132/80   Pulse 63   Ht 6\' 1"  (1.854 m)   Wt 195 lb 9.6 oz (88.7 kg)   SpO2 95%   BMI 25.81 kg/m     Wt Readings from Last 3 Encounters:  01/01/20 195 lb 9.6 oz (88.7 kg)  11/09/19 190 lb (86.2 kg)  07/17/19 195 lb (88.5 kg)     Constitutional:      Appearance: Healthy appearance. Not in distress.  Neck:     Thyroid: No thyromegaly.     Vascular: JVD normal.  Pulmonary:     Effort: Pulmonary effort is normal.     Breath sounds: No wheezing. No rales.  Cardiovascular:     Normal rate. Regular rhythm. Normal S1. Normal S2.     Murmurs: There is no murmur.  Edema:    Peripheral edema absent.  Abdominal:     Palpations: Abdomen is soft. There is no hepatomegaly.  Skin:    General: Skin is warm and dry.  Neurological:     General: No focal deficit present.     Mental Status: Alert and oriented to person, place and time.     Cranial Nerves: Cranial nerves are intact.       ASSESSMENT & PLAN:    1. Shortness of breath Etiology not entirely clear. His anginal  equivalent in the past has been shortness of breath. He does not clearly have shortness of breath with all activities. His electrocardiogram does not demonstrate any acute changes. He does not really demonstrate any signs of volume excess. His oxygen saturation is normal. He does not have any infectious symptoms. We discussed proceeding with an echocardiogram and stress  test. We also discussed starting on an additional antianginal as well as obtaining lab work. He would like to hold off on starting any new medications or performing a stress test for now. Over the past 2 days, he has felt better. He notes that he would like to avoid any stress testing unless he has recurrent symptoms.  -Obtain 2D echocardiogram  -Obtain BMET, CBC, BNP  -Follow-up 6 to 8 weeks  2. Coronary artery disease involving native coronary artery of native heart with angina pectoris [facility]) History of myocardial infarction 1998 treated balloon angioplasty to the RCA. He has known mid LAD disease by cardiac catheterization in 2013 which has been managed medically. As noted, his shortness of breath could be an anginal equivalent. We will proceed with an echocardiogram as outlined above as well as labs. If he has recurrent symptoms over the next couple of weeks, he will contact me so that we can arrange stress testing and start low dose Isosorbide (he does not take PDE-5 inhibitors).  3. Mixed hyperlipidemia LDL optimal on most recent lab work.  Continue current Rx.      Dispo:  Return in 5 weeks (on 02/08/2020) for w/ Richardson Dopp, PA-C.   Medication Adjustments/Labs and Tests Ordered: Current medicines are reviewed at length with the patient today.  Concerns regarding medicines are outlined above.  Tests Ordered: Orders Placed This Encounter  Procedures  . Basic metabolic panel  . CBC  . Pro b natriuretic peptide (BNP)  . EKG 12-Lead  . ECHOCARDIOGRAM COMPLETE   Medication Changes: No orders of the defined types were  placed in this encounter.   Signed, Richardson Dopp, PA-C  01/01/2020 4:25 PM    [facility] [address], [place], [place]  [address] Phone: [phone]; Fax: [phone]

## 2020-01-01 NOTE — Telephone Encounter (Signed)
The patient reports increased SOB over the last several weeks. It "has no rhyme or reason" and occurs randomly. He works out and does fine on the Elliptical for 30 minutes. He played golf the other day with no problems.  Most of the time the SOB occurs with exertion, but minimal exertion. He gets SOB and a little dizzy when he stands up or sometimes when walking a short distance.  He is not in acute distress and feels good today, but would still like to be checked out. Confirmed appointment this afternoon with Richardson Dopp at the [place] office. The patient was grateful for call and agrees with plan.

## 2020-01-01 NOTE — Patient Instructions (Signed)
Medication Instructions:  Your physician recommends that you continue on your current medications as directed. Please refer to the Current Medication list given to you today.  *If you need a refill on your cardiac medications before your next appointment, please call your pharmacy*  Lab Work: You will have labs drawn today: BMET/BNP/CBC  If you have labs (blood work) drawn today and your tests are completely normal, you will receive your results only by: Marland Kitchen MyChart Message (if you have MyChart) OR . A paper copy in the mail If you have any lab test that is abnormal or we need to change your treatment, we will call you to review the results.  Testing/Procedures: Your physician has requested that you have an echocardiogram. Echocardiography is a painless test that uses sound waves to create images of your heart. It provides your doctor with information about the size and shape of your heart and how well your heart's chambers and valves are working. This procedure takes approximately one hour. There are no restrictions for this procedure.  Follow-Up: On 02/08/2020 at 11:15AM with Richardson Dopp, PA-C

## 2020-01-02 ENCOUNTER — Other Ambulatory Visit: Payer: Self-pay

## 2020-01-02 ENCOUNTER — Ambulatory Visit: Payer: PPO

## 2020-01-02 DIAGNOSIS — R0602 Shortness of breath: Secondary | ICD-10-CM

## 2020-01-02 LAB — BASIC METABOLIC PANEL
BUN/Creatinine Ratio: 15 (ref 10–24)
BUN: 19 mg/dL (ref 8–27)
CO2: 25 mmol/L (ref 20–29)
Calcium: 9.6 mg/dL (ref 8.6–10.2)
Chloride: 102 mmol/L (ref 96–106)
Creatinine, Ser: 1.3 mg/dL — ABNORMAL HIGH (ref 0.76–1.27)
GFR calc Af Amer: 59 mL/min/{1.73_m2} — ABNORMAL LOW (ref >59)
GFR calc non Af Amer: 51 mL/min/{1.73_m2} — ABNORMAL LOW (ref >59)
Glucose: 93 mg/dL (ref 65–99)
Potassium: 5.4 mmol/L — ABNORMAL HIGH (ref 3.5–5.2)
Sodium: 138 mmol/L (ref 134–144)

## 2020-01-02 LAB — PRO B NATRIURETIC PEPTIDE: NT-Pro BNP: 42 pg/mL (ref 0–486)

## 2020-01-02 LAB — CBC
Hematocrit: 44.7 % (ref 37.5–51.0)
Hemoglobin: 15.1 g/dL (ref 13.0–17.7)
MCH: 29.4 pg (ref 26.6–33.0)
MCHC: 33.8 g/dL (ref 31.5–35.7)
MCV: 87 fL (ref 79–97)
Platelets: 200 10*3/uL (ref 150–450)
RBC: 5.13 x10E6/uL (ref 4.14–5.80)
RDW: 12.3 % (ref 11.6–15.4)
WBC: 8.6 10*3/uL (ref 3.4–10.8)

## 2020-01-02 NOTE — Progress Notes (Signed)
Subjective:   Collin [name] is a 79 y.o. male who presents for Medicare Annual/Subsequent preventive examination.  Review of Systems    N/A  Cardiac Risk Factors include: advanced age (>40men, >63 women);male gender;hypertension;dyslipidemia     Objective:    Today's Vitals   01/03/20 1530  BP: 130/68  Pulse: 67  Temp: 98 F (36.7 C)  TempSrc: Oral  SpO2: 93%  Weight: 199 lb 5 oz (90.4 kg)  Height: 6\' 1"  (1.854 m)   Body mass index is 26.3 kg/m.  Advanced Directives 01/03/2020 06/14/2019 02/20/2018 01/20/2018 01/20/2018 01/09/2018 09/13/2017  Does Patient Have a Medical Advance Directive? Yes No No Yes Yes Yes Yes  Type of Paramedic of [place];Living will - - [place];Living will [place];Living will - [place];Living will;Out of facility DNR (pink MOST or yellow form)  Does patient want to make changes to medical advance directive? No - Patient declined - - No - Patient declined - - -  Copy of [place] in Chart? No - copy requested - - No - copy requested No - copy requested - No - copy requested  Would patient like information on creating a medical advance directive? - No - Patient declined No - Patient declined - - - -  Pre-existing out of facility DNR order (yellow form or pink MOST form) - - - - - - -    Current Medications (verified) Outpatient Encounter Medications as of 01/03/2020  Medication Sig  . aspirin 81 MG tablet Take 1 tablet (81 mg total) by mouth at bedtime.  . Cholecalciferol (VITAMIN D3) 125 MCG (5000 UT) CAPS Take 5,000 Units by mouth at bedtime.  . Coenzyme Q10 300 MG CAPS Take 300 mg by mouth daily.  . fish oil-omega-3 fatty acids 1000 MG capsule Take 1 g by mouth daily.  . Glucosamine-Chondroitin (MOVE FREE PO) Take 1 tablet by mouth daily.  . metoprolol succinate (TOPROL-XL) 25 MG 24 hr tablet Take 1 tablet (25 mg total) by mouth daily.  .  Multiple Vitamin (MULTIVITAMIN) tablet Take 1 tablet by mouth daily.  . Saw Palmetto 450 MG CAPS Take 450 mg by mouth at bedtime.  . simvastatin (ZOCOR) 40 MG tablet Take 1 tablet (40 mg total) by mouth daily at 6 PM.  . tamsulosin (FLOMAX) 0.4 MG CAPS capsule Take 1 capsule (0.4 mg total) by mouth daily.  . vitamin B-12 (CYANOCOBALAMIN) 1000 MCG tablet Take 1,000 mcg by mouth daily.  Marland Kitchen Bioflavonoid Products (GRAPE SEED PO) Take 1 capsule by mouth daily. (Patient not taking: Reported on 01/03/2020)  . nitroGLYCERIN (NITROSTAT) 0.4 MG SL tablet Place 1 tablet (0.4 mg total) under the tongue every 5 (five) minutes as needed for chest pain. (Patient not taking: Reported on 01/03/2020)   No facility-administered encounter medications on file as of 01/03/2020.    Allergies (verified) Penicillins   History: Past Medical History:  Diagnosis Date  . Allergy   . Arthritis   . BPH (benign prostatic hyperplasia)   . CAD (coronary artery disease)   . Cataract   . Diverticulosis of colon   . Hx of adenomatous colonic polyps 05/22/2014  . Hyperlipidemia   . Hypertension   . MYOCARDIAL INFARCTION, HX OF 07/20/2006   Qualifier: Diagnosis of  By: Leanne Chang MD, Bruce     Past Surgical History:  Procedure Laterality Date  . CATARACT EXTRACTION     Bil  . COLONOSCOPY    .  EYE SURGERY    . LUMBAR LAMINECTOMY/DECOMPRESSION MICRODISCECTOMY Left 01/20/2018   Procedure: Left Lumbar five Sacral one extraforaminal decompression;  Surgeon: Eustace Moore, MD;  Location: [facility];  Service: Neurosurgery;  Laterality: Left;  . PTCA     Family History  Problem Relation Age of Onset  . Heart disease Mother        CABG  . Stroke Father   . Kidney disease Father        renal failure  . AAA (abdominal aortic aneurysm) Father   . Hypertension Sister   . Diabetes Brother   . AAA (abdominal aortic aneurysm) Paternal Uncle   . Colon cancer Neg Hx    Social History   Socioeconomic History  . Marital status:  Married    Spouse name: Not on file  . Number of children: 3  . Years of education: 26  . Highest education level: Not on file  Occupational History  . Occupation: retired  Tobacco Use  . Smoking status: Never Smoker  . Smokeless tobacco: Never Used  Vaping Use  . Vaping Use: Never used  Substance and Sexual Activity  . Alcohol use: Yes    Alcohol/week: 1.0 standard drink    Types: 1 Glasses of wine per week    Comment: Occasional beer.  . Drug use: No  . Sexual activity: Not on file  Other Topics Concern  . Not on file  Social History Narrative   Lives with wife in a 2 story home.  Has 3 children.     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    Education: college.    Occasional beer, no smoking, no drugs   Social Determinants of Health   Financial Resource Strain: Low Risk   . Difficulty of Paying Living Expenses: Not hard at all  Food Insecurity: No Food Insecurity  . Worried About Charity fundraiser in the Last Year: Never true  . Ran Out of Food in the Last Year: Never true  Transportation Needs: No Transportation Needs  . Lack of Transportation (Medical): No  . Lack of Transportation (Non-Medical): No  Physical Activity: Sufficiently Active  . Days of Exercise per Week: 6 days  . Minutes of Exercise per Session: 30 min  Stress: No Stress Concern Present  . Feeling of Stress : Not at all  Social Connections: Moderately Integrated  . Frequency of Communication with Friends and Family: More than three times a week  . Frequency of Social Gatherings with Friends and Family: Three times a week  . Attends Religious Services: More than 4 times per year  . Active Member of Clubs or Organizations: No  . Attends Archivist Meetings: Never  . Marital Status: Married    Tobacco Counseling Counseling given: Not Answered   Clinical Intake:  Pre-visit preparation completed: Yes  Pain : No/denies pain     Nutritional Risks:  None Diabetes: No  How often do you need to have someone help you when you read instructions, pamphlets, or other written materials from your doctor or pharmacy?: 1 - Never What is the last grade level you completed in school?: college  Diabetic?No   Interpreter Needed?: No  Information entered by :: [place] of Daily Living In your present state of health, do you have any difficulty performing the following activities: 01/03/2020  Hearing? Y  Comment Has bilateral hearing aids  Vision? N  Walking or climbing stairs? N  Dressing or bathing?  N  Doing errands, shopping? N  Preparing Food and eating ? N  Using the Toilet? N  In the past six months, have you accidently leaked urine? Y  Comment has urgency  Do you have problems with loss of bowel control? N  Managing your Medications? N  Managing your Finances? N  Housekeeping or managing your Housekeeping? N  Some recent data might be hidden    Patient Care Team: Martinique, Betty G, MD as PCP - General (Family Medicine) Sherren Mocha, MD as PCP - Cardiology (Cardiology)  Indicate any recent Medical Services you may have received from other than [facility] providers in the past year (date may be approximate).     Assessment:   This is a routine wellness examination for [place].  Hearing/Vision screen  Hearing Screening   125Hz  250Hz  500Hz  1000Hz  2000Hz  3000Hz  4000Hz  6000Hz  8000Hz   Right ear:           Left ear:           Vision Screening Comments: Patient states gets eyes examined yearly    Dietary issues and exercise activities discussed: Current Exercise Habits: Home exercise routine, Type of exercise: walking (golf), Time (Minutes): 60, Frequency (Times/Week): 6, Weekly Exercise (Minutes/Week): 360  Goals    . Patient Stated     I will continue to walk and play golf.      Depression Screen PHQ 2/9 Scores 01/03/2020 11/11/2019 11/09/2018 11/02/2017 10/14/2015 10/08/2014 10/02/2013  PHQ - 2 Score 0 0 0 0 0 0  0  PHQ- 9 Score 0 - - - - - -    Fall Risk Fall Risk  01/03/2020 11/09/2018 11/02/2017 01/26/2017 10/14/2015  Falls in the past year? 1 0 No No No  Number falls in past yr: 0 0 - - -  Injury with Fall? 0 0 - - -  Risk for fall due to : Impaired balance/gait;History of fall(s) Impaired balance/gait;Orthopedic patient - - -  Follow up Falls evaluation completed;Falls prevention discussed Education provided - - -    FALL RISK PREVENTION PERTAINING TO THE HOME:  Any stairs in or around the home? Yes  If so, are there any without handrails? No  Home free of loose throw rugs in walkways, pet beds, electrical cords, etc? Yes  Adequate lighting in your home to reduce risk of falls? Yes   ASSISTIVE DEVICES UTILIZED TO PREVENT FALLS:  Life alert? No  Use of a cane, walker or w/c? No  Grab bars in the bathroom? Yes  Shower chair or bench in shower? Yes  Elevated toilet seat or a handicapped toilet? No   TIMED UP AND GO:  Was the test performed? Yes .  Length of time to ambulate 10 feet: 5 sec.   Gait steady and fast without use of assistive device  Cognitive Function:   Montreal Cognitive Assessment  01/26/2017  Visuospatial/ Executive (0/5) 4  Naming (0/3) 2  Attention: Read list of digits (0/2) 2  Attention: Read list of letters (0/1) 1  Attention: Serial 7 subtraction starting at 100 (0/3) 3  Language: Repeat phrase (0/2) 2  Language : Fluency (0/1) 1  Abstraction (0/2) 2  Delayed Recall (0/5) 3  Orientation (0/6) 5  Total 25  Adjusted Score (based on education) 25   6CIT Screen 01/03/2020  What Year? 0 points  What month? 0 points  What time? 0 points  Count back from 20 0 points  Months in reverse 0 points  Repeat phrase 2 points  Total Score 2    Immunizations Immunization History  Administered Date(s) Administered  . Fluad Quad(high Dose 65+) 11/05/2019  . Influenza Whole 12/04/2008, 01/20/2010, 07/26/2010  . Influenza, High Dose Seasonal PF 10/16/2015,  11/17/2016, 11/02/2017  . Influenza,inj,Quad PF,6+ Mos 11/20/2012, 11/20/2013, 10/08/2014  . Influenza-Unspecified 11/17/2016  . PFIZER SARS-COV-2 Vaccination 03/02/2019, 03/27/2019, 10/30/2019  . Pneumococcal Conjugate-13 11/20/2013  . Pneumococcal Polysaccharide-23 07/20/2006  . Td 07/20/2006  . Zoster 09/09/2010  . Zoster Recombinat (Shingrix) 03/27/2018, 07/14/2018    TDAP status: Due, Education has been provided regarding the importance of this vaccine. Advised may receive this vaccine at local pharmacy or Health Dept. Aware to provide a copy of the vaccination record if obtained from local pharmacy or Health Dept. Verbalized acceptance and understanding.  Flu Vaccine status: Up to date  Pneumococcal vaccine status: Up to date  Covid-19 vaccine status: Completed vaccines  Qualifies for Shingles Vaccine? Yes   Zostavax completed Yes   Shingrix Completed?: Yes  Screening Tests Health Maintenance  Topic Date Due  . COLONOSCOPY  05/13/2017  . INFLUENZA VACCINE  Completed  . COVID-19 Vaccine  Completed  . PNA vac Low Risk Adult  Completed  . TETANUS/TDAP  Discontinued    Health Maintenance  Health Maintenance Due  Topic Date Due  . COLONOSCOPY  05/13/2017    Colorectal cancer screening: No longer required.   Lung Cancer Screening: (Low Dose CT Chest recommended if Age 28-80 years, 30 pack-year currently smoking OR have quit w/in 15years.) does not qualify.   Lung Cancer Screening Referral: N/A   Additional Screening:  Hepatitis C Screening: does not qualify;   Vision Screening: Recommended annual ophthalmology exams for early detection of glaucoma and other disorders of the eye. Is the patient up to date with their annual eye exam?  Yes  Who is the provider or what is the name of the office in which the patient attends annual eye exams? [facility]  If pt is not established with a provider, would they like to be referred to a provider to establish  care? No .   Dental Screening: Recommended annual dental exams for proper oral hygiene  Community Resource Referral / Chronic Care Management: CRR required this visit?  No   CCM required this visit?  No      Plan:     I have personally reviewed and noted the following in the patient's chart:   . Medical and social history . Use of alcohol, tobacco or illicit drugs  . Current medications and supplements . Functional ability and status . Nutritional status . Physical activity . Advanced directives . List of other physicians . Hospitalizations, surgeries, and ER visits in previous 12 months . Vitals . Screenings to include cognitive, depression, and falls . Referrals and appointments  In addition, I have reviewed and discussed with patient certain preventive protocols, quality metrics, and best practice recommendations. A written personalized care plan for preventive services as well as general preventive health recommendations were provided to patient.     Ofilia Neas, LPN   91/09/1658   Nurse Notes: None

## 2020-01-03 ENCOUNTER — Telehealth: Payer: Self-pay | Admitting: Family Medicine

## 2020-01-03 ENCOUNTER — Ambulatory Visit (INDEPENDENT_AMBULATORY_CARE_PROVIDER_SITE_OTHER): Payer: PPO

## 2020-01-03 ENCOUNTER — Other Ambulatory Visit: Payer: Self-pay

## 2020-01-03 VITALS — BP 130/68 | HR 67 | Temp 98.0°F | Ht 73.0 in | Wt 199.3 lb

## 2020-01-03 DIAGNOSIS — E785 Hyperlipidemia, unspecified: Secondary | ICD-10-CM | POA: Diagnosis not present

## 2020-01-03 DIAGNOSIS — Z0000 Encounter for general adult medical examination without abnormal findings: Secondary | ICD-10-CM

## 2020-01-03 DIAGNOSIS — R7303 Prediabetes: Secondary | ICD-10-CM | POA: Diagnosis not present

## 2020-01-03 NOTE — Patient Instructions (Signed)
[name] , Thank you for taking time to come for your Medicare Wellness Visit. I appreciate your ongoing commitment to your health goals. Please review the following plan we discussed and let me know if I can assist you in the future.   Screening recommendations/referrals: Colonoscopy: Currently due, please contact [facility]  Recommended yearly ophthalmology/optometry visit for glaucoma screening and checkup Recommended yearly dental visit for hygiene and checkup  Vaccinations: Influenza vaccine: Up to date, next due fall 2022  Pneumococcal vaccine: Completed series  Tdap vaccine: Currently due, you may contact your insurance company to discuss cost or you may await and injury to receive  Shingles vaccine: Completed series    Advanced directives: Please bring in a copy of your Advanced Medical directives into our office so that it may be scanned into your chart.  Conditions/risks identified: None   Next appointment: 01/05/2021 @ 3:30 PM with [place] 65 Years and Older, Male Preventive care refers to lifestyle choices and visits with your health care provider that can promote health and wellness. What does preventive care include?  A yearly physical exam. This is also called an annual well check.  Dental exams once or twice a year.  Routine eye exams. Ask your health care provider how often you should have your eyes checked.  Personal lifestyle choices, including:  Daily care of your teeth and gums.  Regular physical activity.  Eating a healthy diet.  Avoiding tobacco and drug use.  Limiting alcohol use.  Practicing safe sex.  Taking low doses of aspirin every day.  Taking vitamin and mineral supplements as recommended by your health care provider. What happens during an annual well check? The services and screenings done by your health care provider during your annual well check will depend on your age, overall health,  lifestyle risk factors, and family history of disease. Counseling  Your health care provider may ask you questions about your:  Alcohol use.  Tobacco use.  Drug use.  Emotional well-being.  Home and relationship well-being.  Sexual activity.  Eating habits.  History of falls.  Memory and ability to understand (cognition).  Work and work Statistician. Screening  You may have the following tests or measurements:  Height, weight, and BMI.  Blood pressure.  Lipid and cholesterol levels. These may be checked every 5 years, or more frequently if you are over 63 years old.  Skin check.  Lung cancer screening. You may have this screening every year starting at age 12 if you have a 30-pack-year history of smoking and currently smoke or have quit within the past 15 years.  Fecal occult blood test (FOBT) of the stool. You may have this test every year starting at age 27.  Flexible sigmoidoscopy or colonoscopy. You may have a sigmoidoscopy every 5 years or a colonoscopy every 10 years starting at age 31.  Prostate cancer screening. Recommendations will vary depending on your family history and other risks.  Hepatitis C blood test.  Hepatitis B blood test.  Sexually transmitted disease (STD) testing.  Diabetes screening. This is done by checking your blood sugar (glucose) after you have not eaten for a while (fasting). You may have this done every 1-3 years.  Abdominal aortic aneurysm (AAA) screening. You may need this if you are a current or former smoker.  Osteoporosis. You may be screened starting at age 58 if you are at high risk. Talk with your health care provider about your test results,  treatment options, and if necessary, the need for more tests. Vaccines  Your health care provider may recommend certain vaccines, such as:  Influenza vaccine. This is recommended every year.  Tetanus, diphtheria, and acellular pertussis (Tdap, Td) vaccine. You may need a Td booster  every 10 years.  Zoster vaccine. You may need this after age 38.  Pneumococcal 13-valent conjugate (PCV13) vaccine. One dose is recommended after age 11.  Pneumococcal polysaccharide (PPSV23) vaccine. One dose is recommended after age 61. Talk to your health care provider about which screenings and vaccines you need and how often you need them. This information is not intended to replace advice given to you by your health care provider. Make sure you discuss any questions you have with your health care provider. Document Released: 02/07/2015 Document Revised: 10/01/2015 Document Reviewed: 11/12/2014 Elsevier Interactive Patient Education  2017 [place] Prevention in the Home Falls can cause injuries. They can happen to people of all ages. There are many things you can do to make your home safe and to help prevent falls. What can I do on the outside of my home?  Regularly fix the edges of walkways and driveways and fix any cracks.  Remove anything that might make you trip as you walk through a door, such as a raised step or threshold.  Trim any bushes or trees on the path to your home.  Use bright outdoor lighting.  Clear any walking paths of anything that might make someone trip, such as rocks or tools.  Regularly check to see if handrails are loose or broken. Make sure that both sides of any steps have handrails.  Any raised decks and porches should have guardrails on the edges.  Have any leaves, snow, or ice cleared regularly.  Use sand or salt on walking paths during winter.  Clean up any spills in your garage right away. This includes oil or grease spills. What can I do in the bathroom?  Use night lights.  Install grab bars by the toilet and in the tub and shower. Do not use towel bars as grab bars.  Use non-skid mats or decals in the tub or shower.  If you need to sit down in the shower, use a plastic, non-slip stool.  Keep the floor dry. Clean up any  water that spills on the floor as soon as it happens.  Remove soap buildup in the tub or shower regularly.  Attach bath mats securely with double-sided non-slip rug tape.  Do not have throw rugs and other things on the floor that can make you trip. What can I do in the bedroom?  Use night lights.  Make sure that you have a light by your bed that is easy to reach.  Do not use any sheets or blankets that are too big for your bed. They should not hang down onto the floor.  Have a firm chair that has side arms. You can use this for support while you get dressed.  Do not have throw rugs and other things on the floor that can make you trip. What can I do in the kitchen?  Clean up any spills right away.  Avoid walking on wet floors.  Keep items that you use a lot in easy-to-reach places.  If you need to reach something above you, use a strong step stool that has a grab bar.  Keep electrical cords out of the way.  Do not use floor polish or wax that makes floors  slippery. If you must use wax, use non-skid floor wax.  Do not have throw rugs and other things on the floor that can make you trip. What can I do with my stairs?  Do not leave any items on the stairs.  Make sure that there are handrails on both sides of the stairs and use them. Fix handrails that are broken or loose. Make sure that handrails are as long as the stairways.  Check any carpeting to make sure that it is firmly attached to the stairs. Fix any carpet that is loose or worn.  Avoid having throw rugs at the top or bottom of the stairs. If you do have throw rugs, attach them to the floor with carpet tape.  Make sure that you have a light switch at the top of the stairs and the bottom of the stairs. If you do not have them, ask someone to add them for you. What else can I do to help prevent falls?  Wear shoes that:  Do not have high heels.  Have rubber bottoms.  Are comfortable and fit you well.  Are closed  at the toe. Do not wear sandals.  If you use a stepladder:  Make sure that it is fully opened. Do not climb a closed stepladder.  Make sure that both sides of the stepladder are locked into place.  Ask someone to hold it for you, if possible.  Clearly mark and make sure that you can see:  Any grab bars or handrails.  First and last steps.  Where the edge of each step is.  Use tools that help you move around (mobility aids) if they are needed. These include:  Canes.  Walkers.  Scooters.  Crutches.  Turn on the lights when you go into a dark area. Replace any light bulbs as soon as they burn out.  Set up your furniture so you have a clear path. Avoid moving your furniture around.  If any of your floors are uneven, fix them.  If there are any pets around you, be aware of where they are.  Review your medicines with your doctor. Some medicines can make you feel dizzy. This can increase your chance of falling. Ask your doctor what other things that you can do to help prevent falls. This information is not intended to replace advice given to you by your health care provider. Make sure you discuss any questions you have with your health care provider. Document Released: 11/07/2008 Document Revised: 06/19/2015 Document Reviewed: 02/15/2014 Elsevier Interactive Patient Education  2017 [employer].

## 2020-01-03 NOTE — Telephone Encounter (Signed)
Patient came in today for his medicare wellness visit and had concerns of urinary frequency. Patient states that he has been taking the tamsulosin as prescribed but states that is not working. He would like to know if you could change the prescription or if you recommend he sees a urologist. Please advise?

## 2020-01-14 ENCOUNTER — Telehealth: Payer: Self-pay | Admitting: Pharmacist

## 2020-01-14 NOTE — Telephone Encounter (Signed)
Patient is scheduled to see Dr. Martinique on 01/15/2020 at 4 and wanted to schedule to see the pharmacist at 3:30

## 2020-01-14 NOTE — Chronic Care Management (AMB) (Signed)
Chronic Care Management Pharmacy Assistant   Name: Collin [name]  MRN: [id number] DOB: 11-15-1940  Reason for Encounter: Medication Review/Initial Questions for Pharmacist visit on 01-15-2020  Patient Questions: 1. Have you seen any other providers since your last visit?  Kathlen Mody, Blair Dolphin, PA-C Cardiology 2. Any changes in your medications or health? No 3. Any side effects from any medications? No 4. Do you have any symptoms or problems not managed by your medications? No 5. Any concerns about your health right now? No 6. Has your provider asked that you check blood pressure, blood sugar, or follow a special diet at home? No 7. Do you get any type of exercise regularly?  No 8. Can you think of a goal you would like to reach for your health?  . To get out of his prediabetic state 9. Do you have any problems getting your medications?  10. Is there anything that you would like to discuss during the appointment?  The patient was asked to please bring medications, blood pressure/ blood sugar log, and supplements to his appointment. PCP : Martinique, Betty G, MD  Allergies:   Allergies  Allergen Reactions  . Penicillins Rash and Other (See Comments)    PATIENT HAS HAD A PCN REACTION WITH IMMEDIATE RASH, FACIAL/TONGUE/THROAT SWELLING, SOB, OR LIGHTHEADEDNESS WITH HYPOTENSION:  #  #  YES  #  #  Has patient had a PCN reaction causing severe rash involving mucus membranes or skin necrosis: No Has patient had a PCN reaction that required hospitalization: No Has patient had a PCN reaction occurring within the last 10 years: No If all of the above answers are "NO", then may proceed with Cephalosporin use.    Medications: Outpatient Encounter Medications as of 01/14/2020  Medication Sig  . aspirin 81 MG tablet Take 1 tablet (81 mg total) by mouth at bedtime.  Marland Kitchen Bioflavonoid Products (GRAPE SEED PO) Take 1 capsule by mouth daily. (Patient not taking: Reported on 01/03/2020)  .  Cholecalciferol (VITAMIN D3) 125 MCG (5000 UT) CAPS Take 5,000 Units by mouth at bedtime.  . Coenzyme Q10 300 MG CAPS Take 300 mg by mouth daily.  . fish oil-omega-3 fatty acids 1000 MG capsule Take 1 g by mouth daily.  . Glucosamine-Chondroitin (MOVE FREE PO) Take 1 tablet by mouth daily.  . metoprolol succinate (TOPROL-XL) 25 MG 24 hr tablet Take 1 tablet (25 mg total) by mouth daily.  . Multiple Vitamin (MULTIVITAMIN) tablet Take 1 tablet by mouth daily.  . nitroGLYCERIN (NITROSTAT) 0.4 MG SL tablet Place 1 tablet (0.4 mg total) under the tongue every 5 (five) minutes as needed for chest pain. (Patient not taking: Reported on 01/03/2020)  . Saw Palmetto 450 MG CAPS Take 450 mg by mouth at bedtime.  . simvastatin (ZOCOR) 40 MG tablet Take 1 tablet (40 mg total) by mouth daily at 6 PM.  . tamsulosin (FLOMAX) 0.4 MG CAPS capsule Take 1 capsule (0.4 mg total) by mouth daily.  . vitamin B-12 (CYANOCOBALAMIN) 1000 MCG tablet Take 1,000 mcg by mouth daily.   No facility-administered encounter medications on file as of 01/14/2020.    Current Diagnosis: Patient Active Problem List   Diagnosis Date Noted  . S/P lumbar laminectomy 01/20/2018  . Essential tremor 11/02/2017  . Unstable gait 11/02/2017  . Lumbar radiculopathy 07/19/2017  . Peripheral neuropathy 12/06/2016  . Hx of adenomatous colonic polyps 05/22/2014  . Left inguinal hernia 09/10/2011  . PSA, INCREASED 08/05/2009  . CONGENITAL PIGMENTARY  ANOMALY OF SKIN 07/25/2007  . Dyslipidemia 07/20/2006  . Coronary atherosclerosis 07/20/2006  . BPH associated with nocturia 07/20/2006    Goals Addressed   None     Follow-Up:  Pharmacist Review   Maia Breslow, [place] Assistant [phone]

## 2020-01-14 NOTE — Telephone Encounter (Signed)
Can you help pt set up a virtual visit to discuss next steps?

## 2020-01-15 ENCOUNTER — Ambulatory Visit: Payer: PPO | Admitting: Pharmacist

## 2020-01-15 ENCOUNTER — Telehealth: Payer: Self-pay

## 2020-01-15 ENCOUNTER — Other Ambulatory Visit: Payer: Self-pay | Admitting: Family Medicine

## 2020-01-15 ENCOUNTER — Ambulatory Visit (INDEPENDENT_AMBULATORY_CARE_PROVIDER_SITE_OTHER): Payer: PPO | Admitting: Family Medicine

## 2020-01-15 ENCOUNTER — Other Ambulatory Visit: Payer: Self-pay

## 2020-01-15 ENCOUNTER — Encounter: Payer: Self-pay | Admitting: Family Medicine

## 2020-01-15 VITALS — BP 128/70 | HR 70 | Temp 98.4°F | Resp 16 | Ht 73.0 in | Wt 196.2 lb

## 2020-01-15 DIAGNOSIS — E785 Hyperlipidemia, unspecified: Secondary | ICD-10-CM

## 2020-01-15 DIAGNOSIS — R7989 Other specified abnormal findings of blood chemistry: Secondary | ICD-10-CM | POA: Diagnosis not present

## 2020-01-15 DIAGNOSIS — E875 Hyperkalemia: Secondary | ICD-10-CM

## 2020-01-15 DIAGNOSIS — R351 Nocturia: Secondary | ICD-10-CM

## 2020-01-15 DIAGNOSIS — N401 Enlarged prostate with lower urinary tract symptoms: Secondary | ICD-10-CM

## 2020-01-15 MED ORDER — FINASTERIDE 5 MG PO TABS: 5.0000 mg | ORAL_TABLET | Freq: Every day | ORAL | 1 refills | Status: DC

## 2020-01-15 NOTE — Telephone Encounter (Signed)
-----   Message from Viona Gilmore, [facility] sent at 01/15/2020  1:31 PM EST ----- Regarding: CCM referral Hi,  Can you please place a CCM referral for Mr. Pagnotta?  Thank you, Maddie

## 2020-01-15 NOTE — Patient Instructions (Signed)
A few things to remember from today's visit:   BPH associated with nocturia - Plan: Ambulatory referral to Urology  If you need refills please call your pharmacy. Do not use My Chart to request refills or for acute issues that need immediate attention.   Stop Flomax. Start Proscar. Pending appt with urologist.  Please be sure medication list is accurate. If a new problem present, please set up appointment sooner than planned today.

## 2020-01-15 NOTE — Chronic Care Management (AMB) (Signed)
Chronic Care Management Pharmacy  Name: Collin [name]  MRN: [id number] DOB: [birth year]  Initial Planning Appointment: completed 01/15/20  Initial Questions: 1. Have you seen any other providers since your last visit? n/a 2. Any changes in your medicines or health? No   Chief Complaint/ HPI  Collin [name],  79 y.o. , male presents for their Initial CCM visit with the clinical pharmacist In office.  PCP : Martinique, Betty G, MD  Their chronic conditions include: Coronary atherosclerosis, HLD, pre-DM, BPH  Office Visits: -01/03/20 Angelina Ok, LPN: Patient presented for medicare annual wellness visit.  -11/09/19 Betty Martinique, MD: Patient presented for annual exam. Prescribed tamsulosin 0.4 mg daily. Recommended increase for gabapentin up to 300 mg daily. Follow up in 6 months.  Consult Visit: -01/02/20 Shelby Dubin (audiology): Patient presented for hearing aid follow up.  -01/01/20 Richardson Dopp, PA-C (cardiology): Patient presented for SOB and CAD follow up.  Obtain ECHO. Follow up in 6-8 weeks.  07/17/19 Hulan Saas, Do (sports medicine): Patient presented for follow up for low back pain. Recommended tart cherry extract 1200 mg at night.  -07/09/19 Camillo Flaming (dermatology): Patient presented for follow up. Unable to access notes.  Medications: Outpatient Encounter Medications as of 01/15/2020  Medication Sig  . aspirin 81 MG tablet Take 1 tablet (81 mg total) by mouth at bedtime.  . Cholecalciferol (VITAMIN D3) 125 MCG (5000 UT) CAPS Take 5,000 Units by mouth at bedtime.  . Coenzyme Q10 300 MG CAPS Take 300 mg by mouth daily.  . Cyanocobalamin (VITAMIN B-12 PO) Take 2,500 mcg by mouth daily.  . fish oil-omega-3 fatty acids 1000 MG capsule Take 1 g by mouth daily.  . Glucosamine-Chondroitin (MOVE FREE PO) Take 1 tablet by mouth daily.  . metoprolol succinate (TOPROL-XL) 25 MG 24 hr tablet Take 1 tablet (25 mg total) by mouth daily.  . Multiple Vitamin  (MULTIVITAMIN) tablet Take 1 tablet by mouth daily.  . Probiotic Product (PROBIOTIC ADVANCED PO) Take 1 capsule by mouth daily.  . Saw Palmetto 450 MG CAPS Take 450 mg by mouth at bedtime.  . simvastatin (ZOCOR) 40 MG tablet Take 1 tablet (40 mg total) by mouth daily at 6 PM.  . tamsulosin (FLOMAX) 0.4 MG CAPS capsule Take 1 capsule (0.4 mg total) by mouth daily.  Marland Kitchen gabapentin (NEURONTIN) 100 MG capsule Take 200 mg by mouth at bedtime. (Patient not taking: Reported on 01/15/2020)  . nitroGLYCERIN (NITROSTAT) 0.4 MG SL tablet Place 1 tablet (0.4 mg total) under the tongue every 5 (five) minutes as needed for chest pain. (Patient not taking: Reported on 01/15/2020)  . [DISCONTINUED] Bioflavonoid Products (GRAPE SEED PO) Take 1 capsule by mouth daily. (Patient not taking: Reported on 01/03/2020)   No facility-administered encounter medications on file as of 01/15/2020.   Patient reports his biggest concern right now is that he still has to go to the bathroom quite frequently. He Has to go a lot during the day and gets up to go the bathroom at least twice during the night. He reports the tamsulosin hasn't helped enough.  Patient reports he mostly eats at home and goes out to eat about 3 times a week. He lives with his wife, Juliann Pulse. They typically eat cereal with fruit and brand for breakfast, eats a protein with veggies for late lunch or dinner or a sandwich and then eats fruit at night. He does exercise 5 days a week by going to the gym for 4 days and then walking  1 day of the weekend. He also plays golf regularly.  Goals Addressed            This Visit's Progress   . Pharmacy Care Plan       CARE PLAN ENTRY (see longitudinal plan of care for additional care plan information)  Current Barriers:  . Chronic Disease Management support, education, and care coordination needs related to Hyperlipidemia, Diabetes, BPH, and Coronary atherosclerosis   Coronary atherosclerosis/coronary artery  disease . Pharmacist Clinical Goal(s): o Over the next 180 days, patient will work with PharmD and providers to prevent heart attacks and strokes . Current regimen:  . Aspirin 81 mg 1 tablet daily . Nitroglycerin 0.4 mg SL tablet as needed . Metoprolol succinate 25 mg 1 tablet daily . Interventions: o Discussed monitoring for signs of bleeding such as unexplained and excessive bleeding from a cut or injury, easy or excessive bruising, blood in urine or stools, and nosebleeds without a known cause o Discussed verifying the expiration date on nitroglycerin tablets . Patient self care activities - Over the next 180 days, patient will: o Continue current medications  Hyperlipidemia Lab Results  Component Value Date/Time   LDLCALC 54 11/05/2019 08:28 AM   . Pharmacist Clinical Goal(s): o Over the next 180 days, patient will work with PharmD and providers to maintain LDL goal < 70 . Current regimen:  . Simvastatin 40 mg 1 tablet daily at 6pm . Omega 3 fatty acids 1000 mg 1 capsule daily . Interventions: o Discussed lowering cholesterol through diet by: Marland Kitchen Limiting foods with cholesterol such as liver and other organ meats, egg yolks, shrimp, and whole milk dairy products . Avoiding saturated fats and trans fats and incorporating healthier fats, such as lean meat, nuts, and unsaturated oils like canola and olive oils . Eating foods with soluble fiber such as whole-grain cereals such as oatmeal and oat bran, fruits such as apples, bananas, oranges, pears, and prunes, legumes such as kidney beans, lentils, chick peas, black-eyed peas, and lima beans, and green leafy vegetables . Limiting alcohol intake o Discussed recommendations for moderate aerobic exercise for 150 minutes/week spread out over 5 days for heart healthy lifestyle . Patient self care activities - Over the next 180 days, patient will: o Continue current medications  Pre-diabetes Lab Results  Component Value Date/Time   HGBA1C  5.9 (H) 11/05/2019 08:28 AM   . Pharmacist Clinical Goal(s): o Over the next 180 days, patient will work with PharmD and providers to maintain A1c goal <6.5% . Current regimen:  o No medications . Interventions: o Discussed -Following the healthy plate method which includes: . Fill half of your plate with nonstarchy vegetables, such as spinach, broccoli, carrots and tomatoes. Venida Jarvis a quarter of your plate with a protein, such as tuna, lean pork or chicken. Venida Jarvis the last quarter with a whole-grain item, such as brown rice, or a starchy vegetable, such as green peas or potatoes. . Include "good" fats such as nuts or avocados in small amounts. . Patient self care activities - Over the next 180 days, patient will: o Work on diet and exercise modifications to prevent onset of diabetes  BPH . Pharmacist Clinical Goal(s) o Over the next 180 days, patient will work with PharmD and providers to minimize symptoms of enlarged prostate . Current regimen:  . Tamsulosin 0.4 mg 1 capsule daily . Saw palmetto 450 mg 1 capsule at bedtime . Interventions: o Discussed consistent administration of tamsulosin 30 minutes to 1 hour  after a meal . Patient self care activities - Over the next 180 days, patient will: o Continue current medications  Medication management . Pharmacist Clinical Goal(s): o Over the next 180 days, patient will work with PharmD and providers to maintain optimal medication adherence . Current pharmacy: [facility] . Interventions o Comprehensive medication review performed. o Continue current medication management strategy . Patient self care activities - Over the next 180 days, patient will: o Take medications as prescribed o Report any questions or concerns to PharmD and/or provider(s)  Initial goal documentation        SDOH Interventions   Flowsheet Row Most Recent Value  SDOH Interventions   Financial Strain Interventions Intervention Not Indicated   Transportation Interventions Intervention Not Indicated       Current Diagnosis/Assessment:   Hyperlipidemia   LDL goal < 70  Last lipids Lab Results  Component Value Date   CHOL 130 11/05/2019   HDL 59 11/05/2019   LDLCALC 54 11/05/2019   TRIG 87 11/05/2019   CHOLHDL 2.2 11/05/2019   Hepatic Function Latest Ref Rng & Units 11/05/2019 06/04/2019 11/06/2018  Total Protein 6.1 - 8.1 g/dL 7.0 7.2 6.7  Albumin 3.5 - 5.2 g/dL - 4.4 4.3  AST 10 - 35 U/L 32 38(H) 28  ALT 9 - 46 U/L _0 Alk Phosphatase 39 - 117 U/L - 50 51  Total Bilirubin 0.2 - 1.2 mg/dL 0.9 0.7 0.9  Bilirubin, Direct 0.0 - 0.2 mg/dL 0.2 - -     The ASCVD Risk score Mikey Bussing DC Jr., et al., 2013) failed to calculate for the following reasons:   The 2013 ASCVD risk score is only valid for ages 91 to 40   Patient has failed these meds in past: none Patient is currently controlled on the following medications:  . Simvastatin 40 mg 1 tablet daily at 6pm . Omega 3 fatty acids 1400 mg 1 capsule daily  We discussed:  diet and exercise extensively  -Lowering cholesterol through diet by: Marland Kitchen Limiting foods with cholesterol such as liver and other organ meats, egg yolks, shrimp, and whole milk dairy products . Avoiding saturated fats and trans fats and incorporating healthier fats, such as lean meat, nuts, and unsaturated oils like canola and olive oils . Eating foods with soluble fiber such as whole-grain cereals such as oatmeal and oat bran, fruits such as apples, bananas, oranges, pears, and prunes, legumes such as kidney beans, lentils, chick peas, black-eyed peas, and lima beans, and green leafy vegetables . Limiting alcohol intake  Plan Reassess the need for fish oil at follow up - likely no benefit from this dose.  Continue current medications  Coronary atherosclerosis/CAD   Patient has failed these meds in past: none Patient is currently controlled on the following medications:  . Aspirin 81 mg 1 tablet  daily . Nitroglycerin 0.4 mg SL tablet PRN . Metoprolol succinate 25 mg 1 tablet daily  We discussed:  Monitoring for signs of bleeding such as unexplained and excessive bleeding from a cut or injury, easy or excessive bruising, blood in urine or stools, and nosebleeds without a known cause; checking expiration date on nitroglycerin tablets   Plan Patient will start checking blood pressure at home. Continue current medications   Imbalance   Patient has failed these meds in past: none Patient is currently uncontrolled on the following medications:  . Gabapentin 2 capsules at bedtime - was just taking one  We discussed:  Patient is currently  doing PT exercises to help with this  Plan Recommend trial of 2 capsules at bedtime to see if there is a benefit. Continue current medications   Pre-Diabetes   A1c goal <6.5%  Recent Relevant Labs: Lab Results  Component Value Date/Time   HGBA1C 5.9 (H) 11/05/2019 08:28 AM   GFR 61.05 06/04/2019 10:09 AM   GFR 61.14 11/06/2018 09:01 AM    Last diabetic Eye exam: No results found for: HMDIABEYEEXA  Last diabetic Foot exam: No results found for: HMDIABFOOTEX   Checking BG: Never  Recent FBG Readings: n/a  Patient has failed these meds in past: none Patient is currently controlled on the following medications: . No medications  We discussed: diet and exercise extensively --Following the healthy plate method which includes: . Fill half of your plate with nonstarchy vegetables, such as spinach, broccoli, carrots and tomatoes. Venida Jarvis a quarter of your plate with a protein, such as tuna, lean pork or chicken. Venida Jarvis the last quarter with a whole-grain item, such as brown rice, or a starchy vegetable, such as green peas or potatoes. . Include "good" fats such as nuts or avocados in small amounts.  Plan  Continue control with diet and exercise   BPH   PSA  Date Value Ref Range Status  10/03/2014 5.36 (H) 0.10 - 4.00 ng/mL Final   09/25/2013 4.83 (H) 0.10 - 4.00 ng/mL Final  09/06/2012 4.02 (H) 0.10 - 4.00 ng/mL Final     Patient has failed these meds in past: tamsulosin (did not help) Patient is currently uncontrolled on the following medications:  . Tamsulosin 0.4 mg 1 capsule daily . Saw palmetto 450 mg 1 capsule at bedtime  We discussed:  Consistent administration 30 mins to 1 hour after a meal   Plan Patient to discuss with Dr. Martinique about trying alternative medication. Continue current medications  Miscellaneous/OTC   Patient is currently on the following medications:  Marland Kitchen Grape seed 1 capsule daily . Vitamin D3 5000 units 1 capsule at bedtime - can cause nausea/vomiting/frequent urination . Coenzyme Q10 300 mg 1 capsule daily . Glucosamine-chondroitin 1 tablet daily - not sure if its working . Multivitamin 1 tablet daily . Vitamin B-12 2500 mcg 1 tablet daily - vitamin 1000 mcg daily . Probiotic once daily  We discussed: fat soluble vs water soluble nature of vitamins; oversupplementation with vitamin D can cause nausea/vomiting/frequent urination  Plan Recommend checking a vitamin D level. Patient will decrease vitamin D to 2,000 units daily and vitamin B12 to 1000 mcg daily. Continue current medications  Vaccines   Reviewed and discussed patient's vaccination history.    Immunization History  Administered Date(s) Administered  . Fluad Quad(high Dose 65+) 11/05/2019  . Influenza Whole 12/04/2008, 01/20/2010, 07/26/2010  . Influenza, High Dose Seasonal PF 10/16/2015, 11/17/2016, 11/02/2017  . Influenza,inj,Quad PF,6+ Mos 11/20/2012, 11/20/2013, 10/08/2014  . Influenza-Unspecified 11/17/2016  . PFIZER(Purple Top)SARS-COV-2 Vaccination 03/02/2019, 03/27/2019, 10/30/2019  . Pneumococcal Conjugate-13 11/20/2013  . Pneumococcal Polysaccharide-23 07/20/2006  . Td 07/20/2006  . Zoster 09/09/2010  . Zoster Recombinat (Shingrix) 03/27/2018, 07/14/2018   Plan  Recommended patient receive  tetanus vaccine in office/at pharmacy.   Medication Management   Patient's preferred pharmacy is:  [id number] [place], [address] [place] [address] [place] [address] Phone: [phone] Fax: [phone]  Uses pill box? Yes - weekly pill box Pt endorses 99% compliance - missed once a month  We discussed: Current  pharmacy is preferred with insurance plan and patient is satisfied with pharmacy services  Plan  Continue current medication management strategy    Follow up: 6 month phone visit  Jeni Salles, PharmD [place] Pharmacist [place] [facility] [place] [phone]

## 2020-01-15 NOTE — Progress Notes (Signed)
HPI: [name] is a 79 y.o. male, who is here today c/o urinary frequency. He was seen on 11/09/19, Flomax was recommended to help with problem. He states that has not noted significant differences after starting medication. He would like referral to urologist.  Nocturia x 2, last visit reported "at least x 3." Problem is worse in the afternoon,  q 30-40 min, so he decreased fluid intake. Negative for fever,dysuria,gross hematuria,rectal pain,or difficulty starting urination. Occasionally incontinence if he tries to hold urine.  Low testosterone: Wonders what can be done for this problem. + ED. Denies decreased libido. Lab was ordered by sport medicine.  Lab Results  Component Value Date   TESTOSTERONE 263.95 (L) 06/04/2019    Reports being evaluated by his cardiologist because SOB. Found to be dehydrated and K+ was elevated. SOB has resolved after increasing fluid intake. Negative for CP,palpitations,diaphoresis, and cramps.  Lab Results  Component Value Date   CREATININE 1.30 (H) 01/01/2020   BUN 19 01/01/2020   NA 138 01/01/2020   K 5.4 (H) 01/01/2020   CL 102 01/01/2020   CO2 25 01/01/2020   Review of Systems  Constitutional: Negative for activity change and appetite change.  HENT: Negative for mouth sores, nosebleeds and sore throat.   Respiratory: Negative for cough and wheezing.   Gastrointestinal: Negative for abdominal pain, nausea and vomiting.  Musculoskeletal: Negative for gait problem.  Skin: Negative for pallor and rash.  Neurological: Negative for syncope, weakness and headaches.  Psychiatric/Behavioral: Negative for confusion.  Rest see pertinent positives and negatives per HPI.  Current Outpatient Medications on File Prior to Visit  Medication Sig Dispense Refill   aspirin 81 MG tablet Take 1 tablet (81 mg total) by mouth at bedtime. 30 tablet    Cholecalciferol (VITAMIN D3) 125 MCG (5000 UT) CAPS Take 5,000 Units by mouth at  bedtime.     Coenzyme Q10 300 MG CAPS Take 300 mg by mouth daily.     Cyanocobalamin (VITAMIN B-12 PO) Take 2,500 mcg by mouth daily.     fish oil-omega-3 fatty acids 1000 MG capsule Take 1 g by mouth daily.     Glucosamine-Chondroitin (MOVE FREE PO) Take 1 tablet by mouth daily.     metoprolol succinate (TOPROL-XL) 25 MG 24 hr tablet Take 1 tablet (25 mg total) by mouth daily. 90 tablet 3   Multiple Vitamin (MULTIVITAMIN) tablet Take 1 tablet by mouth daily.     nitroGLYCERIN (NITROSTAT) 0.4 MG SL tablet Place 1 tablet (0.4 mg total) under the tongue every 5 (five) minutes as needed for chest pain. (Patient not taking: Reported on 01/15/2020) 25 tablet 3   Saw Palmetto 450 MG CAPS Take 450 mg by mouth at bedtime.     simvastatin (ZOCOR) 40 MG tablet Take 1 tablet (40 mg total) by mouth daily at 6 PM. 90 tablet 3   tamsulosin (FLOMAX) 0.4 MG CAPS capsule Take 1 capsule (0.4 mg total) by mouth daily. 30 capsule 3   No current facility-administered medications on file prior to visit.   Past Medical History:  Diagnosis Date   Allergy    Arthritis    BPH (benign prostatic hyperplasia)    CAD (coronary artery disease)    Cataract    Diverticulosis of colon    Hx of adenomatous colonic polyps 05/22/2014   Hyperlipidemia    Hypertension    MYOCARDIAL INFARCTION, HX OF 07/20/2006   Qualifier: Diagnosis of  By: Leanne Chang MD, Darnell Level  Allergies  Allergen Reactions   Penicillins Rash and Other (See Comments)    PATIENT HAS HAD A PCN REACTION WITH IMMEDIATE RASH, FACIAL/TONGUE/THROAT SWELLING, SOB, OR LIGHTHEADEDNESS WITH HYPOTENSION:  #  #  YES  #  #  Has patient had a PCN reaction causing severe rash involving mucus membranes or skin necrosis: No Has patient had a PCN reaction that required hospitalization: No Has patient had a PCN reaction occurring within the last 10 years: No If all of the above answers are "NO", then may proceed with Cephalosporin use.    Social  History   Socioeconomic History   Marital status: Married    Spouse name: Not on file   Number of children: 3   Years of education: 16   Highest education level: Not on file  Occupational History   Occupation: retired  Tobacco Use   Smoking status: Never Smoker   Smokeless tobacco: Never Used  Scientific laboratory technician Use: Never used  Substance and Sexual Activity   Alcohol use: Yes    Alcohol/week: 1.0 standard drink    Types: 1 Glasses of wine per week    Comment: Occasional beer.   Drug use: No   Sexual activity: Not on file  Other Topics Concern   Not on file  Social History Narrative   Lives with wife in a 2 story home.  Has 3 children.     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    Education: college.    Occasional beer, no smoking, no drugs   Social Determinants of Radio broadcast assistant Strain: Low Risk    Difficulty of Paying Living Expenses: Not hard at all  Food Insecurity: No Food Insecurity   Worried About Charity fundraiser in the Last Year: Never true   Ran Out of Food in the Last Year: Never true  Transportation Needs: No Transportation Needs   Lack of Transportation (Medical): No   Lack of Transportation (Non-Medical): No  Physical Activity: Sufficiently Active   Days of Exercise per Week: 6 days   Minutes of Exercise per Session: 30 min  Stress: No Stress Concern Present   Feeling of Stress : Not at all  Social Connections: Moderately Integrated   Frequency of Communication with Friends and Family: More than three times a week   Frequency of Social Gatherings with Friends and Family: Three times a week   Attends Religious Services: More than 4 times per year   Active Member of Clubs or Organizations: No   Attends Archivist Meetings: Never   Marital Status: Married   Vitals:   01/15/20 1611  BP: 128/70  Pulse: 70  Resp: 16  Temp: 98.4 F (36.9 C)  SpO2: 95%   Body mass index is  25.89 kg/m.  Physical Exam Vitals and nursing note reviewed.  Constitutional:      General: He is not in acute distress.    Appearance: He is well-developed.  HENT:     Head: Normocephalic and atraumatic.     Mouth/Throat:     Mouth: Oropharynx is clear and moist and mucous membranes are normal. Mucous membranes are moist.     Pharynx: Oropharynx is clear.  Eyes:     Conjunctiva/sclera: Conjunctivae normal.  Cardiovascular:     Rate and Rhythm: Normal rate and regular rhythm.     Heart sounds: No murmur heard.   Pulmonary:     Effort: Pulmonary effort is normal. No respiratory  distress.     Breath sounds: Normal breath sounds.  Abdominal:     Palpations: Abdomen is soft. There is no hepatomegaly or mass.     Tenderness: There is no abdominal tenderness. There is no right CVA tenderness or left CVA tenderness.  Musculoskeletal:        General: No edema.  Skin:    General: Skin is warm.     Findings: No erythema or rash.  Neurological:     Mental Status: He is alert and oriented to person, place, and time.     Gait: Gait normal.     Deep Tendon Reflexes: Strength normal.  Psychiatric:        Mood and Affect: Mood and affect normal.        Cognition and Memory: Cognition and memory normal.     Comments: Well groomed, good eye contact.    ASSESSMENT AND PLAN:  [name] was seen today for urinary frequency.  Diagnoses and all orders for this visit:  Orders Placed This Encounter  Procedures   Ambulatory referral to Urology    BPH associated with nocturia He does not think Flomax has helped, so recommend discontinuing medication. He agrees with trying Proscar 5 mg daily. Urology referral placed.  -     finasteride (PROSCAR) 5 MG tablet; Take 1 tablet (5 mg total) by mouth daily.  Low testosterone in male We discussed current guideline for testosterone replacement as well as side effects. Given her hx of CAD, I do not recommend testosterone replacement. He can  discuss treatment options for ED with urologist.  Hyperkalemia Mild. Has appt for lab to re-check K+.   Return if symptoms worsen or fail to improve.  Jerzi Tigert G. Martinique, MD  [facility]. [place] office.   A few things to remember from today's visit:   BPH associated with nocturia - Plan: Ambulatory referral to Urology  If you need refills please call your pharmacy. Do not use My Chart to request refills or for acute issues that need immediate attention.   Stop Flomax. Start Proscar. Pending appt with urologist.  Please be sure medication list is accurate. If a new problem present, please set up appointment sooner than planned today.

## 2020-01-16 ENCOUNTER — Other Ambulatory Visit: Payer: PPO | Admitting: *Deleted

## 2020-01-16 DIAGNOSIS — R0602 Shortness of breath: Secondary | ICD-10-CM

## 2020-01-16 LAB — BASIC METABOLIC PANEL
BUN/Creatinine Ratio: 16 (ref 10–24)
BUN: 19 mg/dL (ref 8–27)
CO2: 25 mmol/L (ref 20–29)
Calcium: 9.2 mg/dL (ref 8.6–10.2)
Chloride: 101 mmol/L (ref 96–106)
Creatinine, Ser: 1.16 mg/dL (ref 0.76–1.27)
GFR calc Af Amer: 68 mL/min/{1.73_m2} (ref >59)
GFR calc non Af Amer: 59 mL/min/{1.73_m2} — ABNORMAL LOW (ref >59)
Glucose: 65 mg/dL (ref 65–99)
Potassium: 4.3 mmol/L (ref 3.5–5.2)
Sodium: 138 mmol/L (ref 134–144)

## 2020-01-22 ENCOUNTER — Other Ambulatory Visit: Payer: PPO

## 2020-01-22 DIAGNOSIS — Z20822 Contact with and (suspected) exposure to covid-19: Secondary | ICD-10-CM | POA: Diagnosis not present

## 2020-01-23 ENCOUNTER — Telehealth (HOSPITAL_COMMUNITY): Payer: Self-pay | Admitting: Radiology

## 2020-01-23 LAB — SARS-COV-2, NAA 2 DAY TAT

## 2020-01-23 LAB — NOVEL CORONAVIRUS, NAA: SARS-CoV-2, NAA: NOT DETECTED

## 2020-01-23 NOTE — Telephone Encounter (Signed)
Patient called and canceled echocardiogram and appointment with Tereso Newcomer. He does not want to reschedule at this time.

## 2020-01-23 NOTE — Telephone Encounter (Signed)
Dorise Bullion, PA-C    01/23/2020 12:46 PM

## 2020-01-29 ENCOUNTER — Other Ambulatory Visit (HOSPITAL_COMMUNITY): Payer: PPO

## 2020-02-01 ENCOUNTER — Ambulatory Visit: Payer: PPO | Admitting: General Practice

## 2020-02-06 ENCOUNTER — Ambulatory Visit: Payer: PPO | Admitting: Physician Assistant

## 2020-02-08 ENCOUNTER — Ambulatory Visit: Payer: PPO | Admitting: Physician Assistant

## 2020-02-21 DIAGNOSIS — N401 Enlarged prostate with lower urinary tract symptoms: Secondary | ICD-10-CM | POA: Diagnosis not present

## 2020-02-21 DIAGNOSIS — R350 Frequency of micturition: Secondary | ICD-10-CM | POA: Diagnosis not present

## 2020-02-21 DIAGNOSIS — R351 Nocturia: Secondary | ICD-10-CM | POA: Diagnosis not present

## 2020-02-21 DIAGNOSIS — E291 Testicular hypofunction: Secondary | ICD-10-CM | POA: Diagnosis not present

## 2020-02-21 DIAGNOSIS — R3915 Urgency of urination: Secondary | ICD-10-CM | POA: Diagnosis not present

## 2020-02-26 NOTE — Patient Instructions (Addendum)
Hi Collin [name],  I am so sorry this didn't make to to you sooner! It was lovely getting to meet you in person. I want to encourage you to keep up the good work with taking care of yourself and taking your medications as prescribed. As we discussed, I do think it would be helpful to check your blood pressure regularly at home. As a reminder, I also recommended decreasing your vitamin D intake to 2,000 units daily and 1000 mcg of vitamin B12 to avoid oversupplementing. Next time you see Dr. Martinique, it may be worth getting vitamin D and vitamin B12 levels just to be sure.  Please give me a call if you have any questions or need anything before our follow up!  Best, Maddie  Jeni Salles, PharmD [facility] Clinical Pharmacist [place] [facility] [place]   Visit Information  Goals Addressed            This Visit's Progress   . Pharmacy Care Plan       CARE PLAN ENTRY (see longitudinal plan of care for additional care plan information)  Current Barriers:  . Chronic Disease Management support, education, and care coordination needs related to Hyperlipidemia, Diabetes, BPH, and Coronary atherosclerosis   Coronary atherosclerosis/coronary artery disease . Pharmacist Clinical Goal(s): o Over the next 180 days, patient will work with PharmD and providers to prevent heart attacks and strokes . Current regimen:  . Aspirin 81 mg 1 tablet daily . Nitroglycerin 0.4 mg SL tablet as needed . Metoprolol succinate 25 mg 1 tablet daily . Interventions: o Discussed monitoring for signs of bleeding such as unexplained and excessive bleeding from a cut or injury, easy or excessive bruising, blood in urine or stools, and nosebleeds without a known cause o Discussed verifying the expiration date on nitroglycerin tablets . Patient self care activities - Over the next 180 days, patient will: o Continue current medications  Hyperlipidemia Lab Results  Component Value Date/Time   LDLCALC 54  11/05/2019 08:28 AM   . Pharmacist Clinical Goal(s): o Over the next 180 days, patient will work with PharmD and providers to maintain LDL goal < 70 . Current regimen:  . Simvastatin 40 mg 1 tablet daily at 6pm . Omega 3 fatty acids 1000 mg 1 capsule daily . Interventions: o Discussed lowering cholesterol through diet by: Marland Kitchen Limiting foods with cholesterol such as liver and other organ meats, egg yolks, shrimp, and whole milk dairy products . Avoiding saturated fats and trans fats and incorporating healthier fats, such as lean meat, nuts, and unsaturated oils like canola and olive oils . Eating foods with soluble fiber such as whole-grain cereals such as oatmeal and oat bran, fruits such as apples, bananas, oranges, pears, and prunes, legumes such as kidney beans, lentils, chick peas, black-eyed peas, and lima beans, and green leafy vegetables . Limiting alcohol intake o Discussed recommendations for moderate aerobic exercise for 150 minutes/week spread out over 5 days for heart healthy lifestyle . Patient self care activities - Over the next 180 days, patient will: o Continue current medications  Pre-diabetes Lab Results  Component Value Date/Time   HGBA1C 5.9 (H) 11/05/2019 08:28 AM   . Pharmacist Clinical Goal(s): o Over the next 180 days, patient will work with PharmD and providers to maintain A1c goal <6.5% . Current regimen:  o No medications . Interventions: o Discussed -Following the healthy plate method which includes: . Fill half of your plate with nonstarchy vegetables, such as spinach, broccoli, carrots and tomatoes. Marland Kitchen  Fill a quarter of your plate with a protein, such as tuna, lean pork or chicken. Venida Jarvis the last quarter with a whole-grain item, such as brown rice, or a starchy vegetable, such as green peas or potatoes. . Include "good" fats such as nuts or avocados in small amounts. . Patient self care activities - Over the next 180 days, patient will: o Work on diet and  exercise modifications to prevent onset of diabetes  BPH . Pharmacist Clinical Goal(s) o Over the next 180 days, patient will work with PharmD and providers to minimize symptoms of enlarged prostate . Current regimen:  . Tamsulosin 0.4 mg 1 capsule daily . Saw palmetto 450 mg 1 capsule at bedtime . Interventions: o Discussed consistent administration of tamsulosin 30 minutes to 1 hour after a meal . Patient self care activities - Over the next 180 days, patient will: o Continue current medications  Medication management . Pharmacist Clinical Goal(s): o Over the next 180 days, patient will work with PharmD and providers to maintain optimal medication adherence . Current pharmacy: [facility] order . Interventions o Comprehensive medication review performed. o Continue current medication management strategy . Patient self care activities - Over the next 180 days, patient will: o Take medications as prescribed o Report any questions or concerns to PharmD and/or provider(s)  Initial goal documentation        Collin [name] was given information about Chronic Care Management services today including:  1. CCM service includes personalized support from designated clinical staff supervised by his physician, including individualized plan of care and coordination with other care providers 2. 24/7 contact phone numbers for assistance for urgent and routine care needs. 3. Standard insurance, coinsurance, copays and deductibles apply for chronic care management only during months in which we provide at least 20 minutes of these services. Most insurances cover these services at 100%, however patients may be responsible for any copay, coinsurance and/or deductible if applicable. This service may help you avoid the need for more expensive face-to-face services. 4. Only one practitioner may furnish and bill the service in a calendar month. 5. The patient may stop CCM services at any time  (effective at the end of the month) by phone call to the office staff.  Patient agreed to services and verbal consent obtained.   The patient verbalized understanding of instructions, educational materials, and care plan provided today and agreed to receive a mailed copy of patient instructions, educational materials, and care plan.  Telephone follow up appointment with pharmacy team member scheduled for: 6 months  Viona Gilmore, [facility]  How to Take Your Blood Pressure Blood pressure measures how strongly your blood is pressing against the walls of your arteries. Arteries are blood vessels that carry blood from your heart throughout your body. You can take your blood pressure at home with a machine. You may need to check your blood pressure at home:  To check if you have high blood pressure (hypertension).  To check your blood pressure over time.  To make sure your blood pressure medicine is working. Supplies needed:  Blood pressure machine, or monitor.  Dining room chair to sit in.  Table or desk.  Small notebook.  Pencil or pen. How to prepare Avoid these things for 30 minutes before checking your blood pressure:  Having drinks with caffeine in them, such as coffee or tea.  Drinking alcohol.  Eating.  Smoking.  Exercising. Do these things five minutes before checking your blood pressure:  Go to the bathroom and pee (urinate).  Sit in a dining chair. Do not sit in a soft couch or an armchair.  Be quiet. Do not talk. How to take your blood pressure Follow the instructions that came with your machine. If you have a digital blood pressure monitor, these may be the instructions: 1. Sit up straight. 2. Place your feet on the floor. Do not cross your ankles or legs. 3. Rest your left arm at the level of your heart. You may rest it on a table, desk, or chair. 4. Pull up your shirt sleeve. 5. Wrap the blood pressure cuff around the upper part of your left arm. The cuff  should be 1 inch (2.5 cm) above your elbow. It is best to wrap the cuff around bare skin. 6. Fit the cuff snugly around your arm. You should be able to place only one finger between the cuff and your arm. 7. Place the cord so that it rests in the bend of your elbow. 8. Press the power button. 9. Sit quietly while the cuff fills with air and loses air. 10. Write down the numbers on the screen. 11. Wait 2-3 minutes and then repeat steps 1-10.   What do the numbers mean? Two numbers make up your blood pressure. The first number is called systolic pressure. The second is called diastolic pressure. An example of a blood pressure reading is "120 over 80" (or 120/80). If you are an adult and do not have a medical condition, use this guide to find out if your blood pressure is normal: Normal  First number: below 120.  Second number: below 80. Elevated  First number: 120-129.  Second number: below 80. Hypertension stage 1  First number: 130-139.  Second number: 80-89. Hypertension stage 2  First number: 140 or above.  Second number: 10 or above. Your blood pressure is above normal even if only the top or bottom number is above normal. Follow these instructions at home:  Check your blood pressure as often as your doctor tells you to.  Check your blood pressure at the same time every day.  Take your monitor to your next doctor's appointment. Your doctor will: ? Make sure you are using it correctly. ? Make sure it is working right.  Make sure you understand what your blood pressure numbers should be.  Tell your doctor if your medicine is causing side effects.  Keep all follow-up visits as told by your doctor. This is important. General tips:  You will need a blood pressure machine, or monitor. Your doctor can suggest a monitor. You can buy one at a drugstore or online. When choosing one: ? Choose one with an arm cuff. ? Choose one that wraps around your upper arm. Only one  finger should fit between your arm and the cuff. ? Do not choose one that measures your blood pressure from your wrist or finger. Where to find more information American Heart Association: www.heart.org Contact a doctor if:  Your blood pressure keeps being high. Get help right away if:  Your first blood pressure number is higher than 180.  Your second blood pressure number is higher than 120. Summary  Check your blood pressure at the same time every day.  Avoid caffeine, alcohol, smoking, and exercise for 30 minutes before checking your blood pressure.  Make sure you understand what your blood pressure numbers should be. This information is not intended to replace advice given to you by your health care  provider. Make sure you discuss any questions you have with your health care provider. Document Revised: 01/05/2019 Document Reviewed: 01/05/2019 Elsevier Patient Education  2021 [employer].

## 2020-03-06 DIAGNOSIS — R350 Frequency of micturition: Secondary | ICD-10-CM | POA: Diagnosis not present

## 2020-03-06 DIAGNOSIS — R3915 Urgency of urination: Secondary | ICD-10-CM | POA: Diagnosis not present

## 2020-03-13 DIAGNOSIS — R350 Frequency of micturition: Secondary | ICD-10-CM | POA: Diagnosis not present

## 2020-03-13 DIAGNOSIS — R3915 Urgency of urination: Secondary | ICD-10-CM | POA: Diagnosis not present

## 2020-03-20 DIAGNOSIS — R350 Frequency of micturition: Secondary | ICD-10-CM | POA: Diagnosis not present

## 2020-03-20 DIAGNOSIS — R3915 Urgency of urination: Secondary | ICD-10-CM | POA: Diagnosis not present

## 2020-03-25 DIAGNOSIS — R3915 Urgency of urination: Secondary | ICD-10-CM | POA: Diagnosis not present

## 2020-03-25 DIAGNOSIS — R350 Frequency of micturition: Secondary | ICD-10-CM | POA: Diagnosis not present

## 2020-04-01 DIAGNOSIS — R3915 Urgency of urination: Secondary | ICD-10-CM | POA: Diagnosis not present

## 2020-04-01 DIAGNOSIS — R350 Frequency of micturition: Secondary | ICD-10-CM | POA: Diagnosis not present

## 2020-04-08 ENCOUNTER — Telehealth (INDEPENDENT_AMBULATORY_CARE_PROVIDER_SITE_OTHER): Payer: PPO | Admitting: Family Medicine

## 2020-04-08 DIAGNOSIS — R350 Frequency of micturition: Secondary | ICD-10-CM | POA: Diagnosis not present

## 2020-04-08 DIAGNOSIS — R6884 Jaw pain: Secondary | ICD-10-CM | POA: Diagnosis not present

## 2020-04-08 NOTE — Patient Instructions (Signed)
I sent a message to your primary care office to request an in person visit for your issues for further evaluation.  If they have not contacted you by tomorrow at noon, please reach out to their office to request an appointment.  If they are unable to see you in person, please seek in person care with your ear nose and throat specialist or at a local urgent care.   I hope you are feeling better soon!  Seek in person care promptly if your symptoms worsen or new concerns arise in the interim.  It was nice to meet you today. I help [facility] out with telemedicine visits on Tuesdays and Thursdays and am available for visits on those days. If you have any concerns or questions following this visit please schedule a follow up visit with your Primary Care doctor or seek care at a local urgent care clinic to avoid delays in care.

## 2020-04-08 NOTE — Progress Notes (Signed)
Virtual Visit via Telephone Note  I connected with Collin [name] on 04/08/20 at  6:20 PM EDT by telephone and verified that I am speaking with the correct person using two identifiers. [employer] was down at the time of this appointment, however, I was able to connect by phone and then was able to log in later to document the phone call.    Location patient: home, [place] Location provider: work or home office Participants present for the call: patient, provider Patient did not have a visit with me in the prior 7 days to address this/these issue(s).   History of Present Illness:  Acute telemedicine visit for Jaw pain: -Onset: 3 weeks ago -Symptoms include: L jaw pain that occurs only with chewing - also some pain into the left throat particularly with swallowing. -Denies:fevers, sinus issues (above baseline allergies), cough, malaise, inability to swallow liquid or solids -Has tried: had similar issues in the past and reports saw several dentists, and oropharyngeal specialist and and ENT specialist. Reports was treated with abx - but abx gave him diarrhea.  -Pertinent past medical history: reports only medications are asa, metoprolol and flomax -Pertinent medication allergies: Penicillin in chart -COVID-19 vaccine status: vaccinated and boosted   Observations/Objective: Patient sounds cheerful and well on the phone. I do not appreciate any SOB. Speech and thought processing are grossly intact. Patient reported vitals:  Assessment and Plan:  Jaw pain  -we discussed possible serious and likely etiologies, options for evaluation and workup, limitations of telemedicine visit vs in person visit, treatment, treatment risks and precautions. Pt prefers to treat via telemedicine empirically rather than in person at this moment.  Had a lengthy discussion about potential etiologies including dental, jaw, sinus or other related pathologies.  At first he seemed to want to try it and an antibiotic for  this.  However, he had diarrhea the last time he took an antibiotic, we are unsure of the diagnosis at this point. I advised an in person examination and explained the rationale.  He would prefer to see his primary care office, rather than returning to the dentist or the ear nose and throat specialist.  I agreed to reach out to his primary care office to see if they have in-person availability. Scheduled follow up with PCP offered:  Sent message to schedulers to assist and advised patient to contact PCP office to schedule if does not receive call back in next 24 hours.  Advised to seek care with a specialist or an urgent care if he is unable to get in with his primary care office promptly. Advised to seek prompt in person care if worsening, new symptoms arise, or if is not improving with treatment. Advised of options for inperson care in case PCP office not available. Did let the patient know that I only do telemedicine shifts for [place] on Tuesdays and Thursdays and advised a follow up visit with PCP or at an [facility] if has further questions or concerns.   Follow Up Instructions:  I did not refer this patient for an OV with me in the next 24 hours for this/these issue(s).  I discussed the assessment and treatment plan with the patient. The patient was provided an opportunity to ask questions and all were answered. The patient agreed with the plan and demonstrated an understanding of the instructions.   I spent 12 minutes on the date of this visit in the care of this patient. See summary of tasks completed to properly care for this  patient in the detailed notes above which also included counseling of above, review of PMH, medications, allergies, evaluation of the patient and ordering and/or  instructing patient on testing and care options.     Lucretia Kern, DO

## 2020-04-09 ENCOUNTER — Other Ambulatory Visit: Payer: Self-pay | Admitting: Family Medicine

## 2020-04-10 ENCOUNTER — Telehealth: Payer: Self-pay | Admitting: Pharmacist

## 2020-04-10 NOTE — Chronic Care Management (AMB) (Addendum)
Chronic Care Management Pharmacy Assistant   Name: Collin [name]  MRN: [id number] DOB: 09/14/1940  Reason for Encounter: General Adherence Call       Recent office visits:  03.15.2022 Lucretia Kern, DO Family Medicine 12.21.2021 Martinique, Betty G, MD Family Medicine  Recent consult visits:  01.27.2022 ; Marton Redwood - Urology  [facility] visits:  None in previous 6 months  Medications: Outpatient Encounter Medications as of 04/10/2020  Medication Sig   aspirin 81 MG tablet Take 1 tablet (81 mg total) by mouth at bedtime.   Cholecalciferol (VITAMIN D3) 125 MCG (5000 UT) CAPS Take 5,000 Units by mouth at bedtime.   Coenzyme Q10 300 MG CAPS Take 300 mg by mouth daily.   Cyanocobalamin (VITAMIN B-12 PO) Take 2,500 mcg by mouth daily.   finasteride (PROSCAR) 5 MG tablet TAKE 1 TABLET(5 MG) BY MOUTH DAILY   fish oil-omega-3 fatty acids 1000 MG capsule Take 1 g by mouth daily.   gabapentin (NEURONTIN) 100 MG capsule Take 200 mg by mouth at bedtime. (Patient not taking: Reported on 01/15/2020)   Glucosamine-Chondroitin (MOVE FREE PO) Take 1 tablet by mouth daily.   metoprolol succinate (TOPROL-XL) 25 MG 24 hr tablet Take 1 tablet (25 mg total) by mouth daily.   Multiple Vitamin (MULTIVITAMIN) tablet Take 1 tablet by mouth daily.   nitroGLYCERIN (NITROSTAT) 0.4 MG SL tablet Place 1 tablet (0.4 mg total) under the tongue every 5 (five) minutes as needed for chest pain. (Patient not taking: Reported on 01/15/2020)   Probiotic Product (PROBIOTIC ADVANCED PO) Take 1 capsule by mouth daily.   Saw Palmetto 450 MG CAPS Take 450 mg by mouth at bedtime.   simvastatin (ZOCOR) 40 MG tablet Take 1 tablet (40 mg total) by mouth daily at 6 PM.   tamsulosin (FLOMAX) 0.4 MG CAPS capsule Take 1 capsule (0.4 mg total) by mouth daily.   No facility-administered encounter medications on file as of 04/10/2020.   Reviewed chart prior to disease state call. Spoke with patient regarding BP  Recent Office  Vitals: BP Readings from Last 3 Encounters:  01/15/20 128/70  01/03/20 130/68  01/01/20 132/80   Pulse Readings from Last 3 Encounters:  01/15/20 70  01/03/20 67  01/01/20 63    Wt Readings from Last 3 Encounters:  01/15/20 196 lb 3.2 oz (89 kg)  01/03/20 199 lb 5 oz (90.4 kg)  01/01/20 195 lb 9.6 oz (88.7 kg)     Kidney Function Lab Results  Component Value Date/Time   CREATININE 1.16 01/16/2020 09:22 AM   CREATININE 1.30 (H) 01/01/2020 03:31 PM   GFR 61.05 06/04/2019 10:09 AM   GFRNONAA 59 (L) 01/16/2020 09:22 AM   GFRAA 68 01/16/2020 09:22 AM    BMP Latest Ref Rng & Units 01/16/2020 01/01/2020 06/04/2019  Glucose 65 - 99 mg/dL 65 93 -  BUN 8 - 27 mg/dL 19 19 -  Creatinine 0.76 - 1.27 mg/dL 1.16 1.30(H) -  BUN/Creat Ratio 10 - 24 16 15  -  Sodium 134 - 144 mmol/L 138 138 -  Potassium 3.5 - 5.2 mmol/L 4.3 5.4(H) -  Chloride 96 - 106 mmol/L 101 102 -  CO2 20 - 29 mmol/L 25 25 -  Calcium 8.6 - 10.2 mg/dL 9.2 9.6 9.5   I spoke with the patient and discussed medication adherence. He does not issue currently with his current medication. He stated that he has not been taking his blood pressure at home since his last PCP  visit. He was encouraged to take his blood pressure at home at least once a week. He understood. He denies any ED visits since his last CPP follow-up. There have been no changes to his medications. The patient denies any side effects from his current medications. He also denies any problems with his current pharmacy.  Star Rating Drugs: Simvastatin 40 mg tablet - Last filled 03/04/20 for 90 ds  Amilia Revonda Standard, [place] Assistant [phone]

## 2020-04-15 DIAGNOSIS — R3915 Urgency of urination: Secondary | ICD-10-CM | POA: Diagnosis not present

## 2020-04-15 DIAGNOSIS — R350 Frequency of micturition: Secondary | ICD-10-CM | POA: Diagnosis not present

## 2020-04-22 DIAGNOSIS — R3915 Urgency of urination: Secondary | ICD-10-CM | POA: Diagnosis not present

## 2020-04-22 DIAGNOSIS — R350 Frequency of micturition: Secondary | ICD-10-CM | POA: Diagnosis not present

## 2020-05-09 ENCOUNTER — Other Ambulatory Visit: Payer: Self-pay | Admitting: Family Medicine

## 2020-05-09 DIAGNOSIS — N401 Enlarged prostate with lower urinary tract symptoms: Secondary | ICD-10-CM

## 2020-05-28 ENCOUNTER — Telehealth: Payer: Self-pay | Admitting: Pharmacist

## 2020-05-28 NOTE — Chronic Care Management (AMB) (Signed)
    Chronic Care Management Pharmacy Assistant   Name: Collin [name]  MRN: [id number] DOB: 03-11-1940  Reason for Encounter: Disease State   Conditions to be addressed/monitored: HTN   Recent office visits:  None  Recent consult visits:  03.29.2022 [name] Urology patient seen for a Percutaneous Tibial Nerve Stimulation. 03.22.2022 [name] Urology patient seen for a Percutaneous Tibial Nerve Stimulation.  Hospital visits:  None in previous 6 months  Medications: Outpatient Encounter Medications as of 05/28/2020  Medication Sig  . aspirin 81 MG tablet Take 1 tablet (81 mg total) by mouth at bedtime.  . Cholecalciferol (VITAMIN D3) 125 MCG (5000 UT) CAPS Take 5,000 Units by mouth at bedtime.  . Coenzyme Q10 300 MG CAPS Take 300 mg by mouth daily.  . Cyanocobalamin (VITAMIN B-12 PO) Take 2,500 mcg by mouth daily.  . finasteride (PROSCAR) 5 MG tablet TAKE 1 TABLET(5 MG) BY MOUTH DAILY  . fish oil-omega-3 fatty acids 1000 MG capsule Take 1 g by mouth daily.  Marland Kitchen gabapentin (NEURONTIN) 100 MG capsule Take 200 mg by mouth at bedtime. (Patient not taking: Reported on 01/15/2020)  . Glucosamine-Chondroitin (MOVE FREE PO) Take 1 tablet by mouth daily.  . metoprolol succinate (TOPROL-XL) 25 MG 24 hr tablet Take 1 tablet (25 mg total) by mouth daily.  . Multiple Vitamin (MULTIVITAMIN) tablet Take 1 tablet by mouth daily.  . nitroGLYCERIN (NITROSTAT) 0.4 MG SL tablet Place 1 tablet (0.4 mg total) under the tongue every 5 (five) minutes as needed for chest pain. (Patient not taking: Reported on 01/15/2020)  . Probiotic Product (PROBIOTIC ADVANCED PO) Take 1 capsule by mouth daily.  . Saw Palmetto 450 MG CAPS Take 450 mg by mouth at bedtime.  . simvastatin (ZOCOR) 40 MG tablet Take 1 tablet (40 mg total) by mouth daily at 6 PM.  . tamsulosin (FLOMAX) 0.4 MG CAPS capsule TAKE 1 CAPSULE(0.4 MG) BY MOUTH DAILY   No facility-administered encounter medications on file as of  05/28/2020.   Attempted on several occasions to contact patient for Hypertension adherence call. Patient has not returned calls.  Star Rating Drugs:  Dispensed Quantity Pharmacy  Simvastatin 40 mg 02.08.2022 [address] ([place]) [place], [place] [phone]

## 2020-05-30 ENCOUNTER — Other Ambulatory Visit: Payer: Self-pay | Admitting: Cardiovascular Disease

## 2020-06-27 DIAGNOSIS — H40013 Open angle with borderline findings, low risk, bilateral: Secondary | ICD-10-CM | POA: Diagnosis not present

## 2020-06-27 DIAGNOSIS — Z961 Presence of intraocular lens: Secondary | ICD-10-CM | POA: Diagnosis not present

## 2020-07-07 ENCOUNTER — Other Ambulatory Visit: Payer: Self-pay | Admitting: Family Medicine

## 2020-07-07 NOTE — Telephone Encounter (Signed)
Sent patient MyChart message to schedule appt for med refill.

## 2020-07-09 ENCOUNTER — Other Ambulatory Visit: Payer: Self-pay | Admitting: Family Medicine

## 2020-07-09 NOTE — Telephone Encounter (Signed)
FYI pt called about gabapentin refill. Advised visit,virtual or in office, needed for refill as last OV June of 2021. Pt refuses to schedule as "nothing has changed" and he thinks it is ridiculous to have a visit. Pt clearly informed that no refill would be done.

## 2020-07-14 ENCOUNTER — Telehealth: Payer: Self-pay | Admitting: Pharmacist

## 2020-07-14 NOTE — Chronic Care Management (AMB) (Signed)
    Chronic Care Management Pharmacy Assistant   Name: Collin [name]  MRN: [id number] DOB: [birth year]  07/14/20- called patient to remind of appointment with Jeni Salles) on (07/15/20 at 1pm by phone)  Patient aware of appointment date, time, and type of appointment (either telephone or in person). Patient aware to have/bring all medications, supplements, blood pressure and/or blood sugar logs to visit.  Questions: Have you had any recent office visit or specialist visit outside of [place]? Yes. Patient saw his eye doctor. [facility]. Are there any concerns you would like to discuss during your office visit? Not at this time.  Are you having any problems obtaining your medications? (Whether it pharmacy issues or cost) no issues at this time.  If patient has any PAP medications ask if they are having any problems getting their PAP medication or refill? No Pap on any medications.   Star Rating Drug:   Any gaps in medications fill history?  Simvastatin 40mg  - last filled on 06/02/20 90DS at [place] Pharmacist Assistant [phone]

## 2020-07-15 ENCOUNTER — Ambulatory Visit (INDEPENDENT_AMBULATORY_CARE_PROVIDER_SITE_OTHER): Payer: PPO | Admitting: Pharmacist

## 2020-07-15 DIAGNOSIS — E785 Hyperlipidemia, unspecified: Secondary | ICD-10-CM | POA: Diagnosis not present

## 2020-07-15 DIAGNOSIS — R351 Nocturia: Secondary | ICD-10-CM

## 2020-07-15 DIAGNOSIS — N401 Enlarged prostate with lower urinary tract symptoms: Secondary | ICD-10-CM

## 2020-07-15 NOTE — Progress Notes (Signed)
Chronic Care Management Pharmacy Note  07/21/2020 Name:  Collin [name] MRN:  [id number] DOB:  1940-03-07  Summary: LDL at goal < 70 Patient requested for Dr. Martinique to prescribe gabapentin  Recommendations/Changes made from today's visit: -Scheduled office visit per request by PCP to manage/refill gabapentin  -Consider prescribing overactive bladder medication as patient still reports symptoms  Plan: Follow up in 6 months   Subjective: Collin [name] is an 80 y.o. year old male who is a primary patient of [name], Malka So, MD.  The CCM team was consulted for assistance with disease management and care coordination needs.    Engaged with patient by telephone for follow up visit in response to provider referral for pharmacy case management and/or care coordination services.   Consent to Services:  The patient was given information about Chronic Care Management services, agreed to services, and gave verbal consent prior to initiation of services.  Please see initial visit note for detailed documentation.   Patient Care Team: [name], Collin G, MD as PCP - General (Family Medicine) Sherren Mocha, MD as PCP - Cardiology (Cardiology) Viona Gilmore, [facility] as Pharmacist (Pharmacist)  Recent office visits: 04/08/20 Collin Benton, DO: Patient presented for video visit for jaw pain. Recommended in person evaluation with PCP.  01/15/20 Collin Martinique, MD: Patient presented for BPH follow up. Flomax did not help and patient prefers urology referral. D/c'd Flomax and prescribed Finasteride 5 mg.  Recent consult visits: 04/22/20 Collin Snuffer, MD (urology): Unable to access notes.  04/15/20 Collin Meckel, MD (urology): Unable to access notes.  04/08/20 Collin Meckel, MD (urology): Unable to access notes.  Hospital visits: None in previous 6 months   Objective:  Lab Results  Component Value Date   CREATININE 1.16 01/16/2020   BUN 19 01/16/2020   GFR 61.05 06/04/2019   GFRNONAA  59 (L) 01/16/2020   GFRAA 68 01/16/2020   NA 138 01/16/2020   K 4.3 01/16/2020   CALCIUM 9.2 01/16/2020   CO2 25 01/16/2020   GLUCOSE 65 01/16/2020    Lab Results  Component Value Date/Time   HGBA1C 5.9 (H) 11/05/2019 08:28 AM   GFR 61.05 06/04/2019 10:09 AM   GFR 61.14 11/06/2018 09:01 AM    Last diabetic Eye exam: No results found for: HMDIABEYEEXA  Last diabetic Foot exam: No results found for: HMDIABFOOTEX   Lab Results  Component Value Date   CHOL 130 11/05/2019   HDL 59 11/05/2019   LDLCALC 54 11/05/2019   TRIG 87 11/05/2019   CHOLHDL 2.2 11/05/2019    Hepatic Function Latest Ref Rng & Units 11/05/2019 06/04/2019 11/06/2018  Total Protein 6.1 - 8.1 [name]/dL 7.0 7.2 6.7  Albumin 3.5 - 5.2 [name]/dL - 4.4 4.3  AST 10 - 35 U/L 32 38(H) 28  ALT 9 - 46 U/L _0 Alk Phosphatase 39 - 117 U/L - 50 51  Total Bilirubin 0.2 - 1.2 mg/dL 0.9 0.7 0.9  Bilirubin, Direct 0.0 - 0.2 mg/dL 0.2 - -    Lab Results  Component Value Date/Time   TSH 1.98 06/04/2019 10:09 AM   TSH 1.26 10/19/2016 02:39 PM    CBC Latest Ref Rng & Units 01/01/2020 06/04/2019 01/09/2018  WBC 3.4 - 10.8 x10E3/uL 8.6 6.6 7.9  Hemoglobin 13.0 - 17.7 [name]/dL 15.1 15.3 15.4  Hematocrit 37.5 - 51.0 % 44.7 45.9 48.1  Platelets 150 - 450 x10E3/uL 200 201.0 197    Lab Results  Component Value Date/Time   VD25OH 92.88  06/04/2019 10:09 AM   VD25OH 60 07/24/2008 09:28 PM    Clinical ASCVD: Yes  The ASCVD Risk score Mikey Bussing DC Jr., et al., 2013) failed to calculate for the following reasons:   The 2013 ASCVD risk score is only valid for ages 59 to 60    Depression screen PHQ 2/9 01/03/2020 11/11/2019 11/09/2018  Decreased Interest 0 0 0  Down, Depressed, Hopeless 0 0 0  PHQ - 2 Score 0 0 0  Altered sleeping 0 - -  Tired, decreased energy 0 - -  Change in appetite 0 - -  Feeling bad or failure about yourself  0 - -  Trouble concentrating 0 - -  Moving slowly or fidgety/restless 0 - -  Suicidal thoughts 0 - -   PHQ-9 Score 0 - -  Difficult doing work/chores Not difficult at all - -      Social History   Tobacco Use  Smoking Status Never  Smokeless Tobacco Never   BP Readings from Last 3 Encounters:  01/15/20 128/70  01/03/20 130/68  01/01/20 132/80   Pulse Readings from Last 3 Encounters:  01/15/20 70  01/03/20 67  01/01/20 63   Wt Readings from Last 3 Encounters:  01/15/20 196 lb 3.2 oz (89 kg)  01/03/20 199 lb 5 oz (90.4 kg)  01/01/20 195 lb 9.6 oz (88.7 kg)   BMI Readings from Last 3 Encounters:  01/15/20 25.89 kg/m  01/03/20 26.30 kg/m  01/01/20 25.81 kg/m    Assessment/Interventions: Review of patient past medical history, allergies, medications, health status, including review of consultants reports, laboratory and other test data, was performed as part of comprehensive evaluation and provision of chronic care management services.   SDOH:  (Social Determinants of Health) assessments and interventions performed: No  SDOH Screenings   Alcohol Screen: Low Risk    Last Alcohol Screening Score (AUDIT): 2  Depression (PHQ2-9): Low Risk    PHQ-2 Score: 0  Financial Resource Strain: Low Risk    Difficulty of Paying Living Expenses: Not hard at all  Food Insecurity: No Food Insecurity   Worried About Charity fundraiser in the Last Year: Never true   Ran Out of Food in the Last Year: Never true  Housing: Low Risk    Last Housing Risk Score: 0  Physical Activity: Sufficiently Active   Days of Exercise per Week: 6 days   Minutes of Exercise per Session: 30 min  Social Connections: Moderately Integrated   Frequency of Communication with Friends and Family: More than three times a week   Frequency of Social Gatherings with Friends and Family: Three times a week   Attends Religious Services: More than 4 times per year   Active Member of Clubs or Organizations: No   Attends Archivist Meetings: Never   Marital Status: Married  Stress: No Stress Concern  Present   Feeling of Stress : Not at all  Tobacco Use: Low Risk    Smoking Tobacco Use: Never   Smokeless Tobacco Use: Never  Transportation Needs: No Transportation Needs   Lack of Transportation (Medical): No   Lack of Transportation (Non-Medical): No   Patient didn't find any relief from the accupuncture. He quit going and didn't have a lot of confidence in them. They told him they thought it was a bladder issue vs prostate issue. He thought PT was going to be for the bladder as well.  CCM Care Plan  Allergies  Allergen Reactions   Penicillins Rash and  Other (See Comments)    PATIENT HAS HAD A PCN REACTION WITH IMMEDIATE RASH, FACIAL/TONGUE/THROAT SWELLING, SOB, OR LIGHTHEADEDNESS WITH HYPOTENSION:  #  #  YES  #  #  Has patient had a PCN reaction causing severe rash involving mucus membranes or skin necrosis: No Has patient had a PCN reaction that required hospitalization: No Has patient had a PCN reaction occurring within the last 10 years: No If all of the above answers are "NO", then may proceed with Cephalosporin use.    Medications Reviewed Today     Reviewed by [name], Collin G, MD (Physician) on 01/15/20 at 2056  Med List Status: <None>   Medication Order Taking? Sig Documenting Provider Last Dose Status Informant  aspirin 81 MG tablet [id number] No Take 1 tablet (81 mg total) by mouth at bedtime. Costella, Vista Mink, PA-C Taking Active   Cholecalciferol (VITAMIN D3) 125 MCG (5000 UT) CAPS [id number] No Take 5,000 Units by mouth at bedtime. [provider] Taking Active Self  Coenzyme Q10 300 MG CAPS [id number] No Take 300 mg by mouth daily. [provider] Taking Active Self  Cyanocobalamin (VITAMIN B-12 PO) [id number] No Take 2,500 mcg by mouth daily. [provider] Taking Active Self  finasteride (PROSCAR) 5 MG tablet [id number]  Take 1 tablet (5 mg total) by mouth daily. [name], Collin G, MD  Active   fish oil-omega-3 fatty acids 1000 MG capsule  [id number] No Take 1 [name] by mouth daily. [provider] Taking Active Self  gabapentin (NEURONTIN) 100 MG capsule [id number] No Take 200 mg by mouth at bedtime.  Patient not taking: Reported on 01/15/2020   [provider] Not Taking Active   Glucosamine-Chondroitin (MOVE FREE PO) [id number] No Take 1 tablet by mouth daily. [provider] Taking Active Self  metoprolol succinate (TOPROL-XL) 25 MG 24 hr tablet [id number] No Take 1 tablet (25 mg total) by mouth daily. Sherren Mocha, MD Taking Active   Multiple Vitamin (MULTIVITAMIN) tablet [id number] No Take 1 tablet by mouth daily. [provider] Taking Active Self  nitroGLYCERIN (NITROSTAT) 0.4 MG SL tablet [id number] No Place 1 tablet (0.4 mg total) under the tongue every 5 (five) minutes as needed for chest pain.  Patient not taking: Reported on 01/15/2020   Sherren Mocha, MD Not Taking Active            Med Note Juventino Slovak   Tue Jan 01, 2020  2:18 PM)    Probiotic Product (PROBIOTIC ADVANCED PO) [id number] No Take 1 capsule by mouth daily. [provider] Taking Active   Saw Palmetto 450 MG CAPS [id number] No Take 450 mg by mouth at bedtime. [provider] Taking Active Self  simvastatin (ZOCOR) 40 MG tablet [id number] No Take 1 tablet (40 mg total) by mouth daily at 6 PM. Sherren Mocha, MD Taking Active   tamsulosin [facility]) 0.4 MG CAPS capsule [id number] No Take 1 capsule (0.4 mg total) by mouth daily. [name], Collin G, MD Taking Active             Patient Active Problem List   Diagnosis Date Noted   S/P lumbar laminectomy 01/20/2018   Essential tremor 11/02/2017   Unstable gait 11/02/2017   Lumbar radiculopathy 07/19/2017   Peripheral neuropathy 12/06/2016   Hx of adenomatous colonic polyps 05/22/2014   Left inguinal hernia 09/10/2011   PSA, INCREASED 08/05/2009   CONGENITAL PIGMENTARY ANOMALY OF SKIN 07/25/2007   Dyslipidemia 07/20/2006   Coronary atherosclerosis 07/20/2006    BPH  associated with nocturia 07/20/2006    Immunization History  Administered Date(s) Administered   Fluad Quad(high Dose 65+) 11/05/2019   Influenza Whole 12/04/2008, 01/20/2010, 07/26/2010   Influenza, High Dose Seasonal PF 10/16/2015, 11/17/2016, 11/02/2017   Influenza,inj,Quad PF,6+ Mos 11/20/2012, 11/20/2013, 10/08/2014   Influenza-Unspecified 11/17/2016   PFIZER(Purple Top)SARS-COV-2 Vaccination 03/02/2019, 03/27/2019, 10/30/2019   Pneumococcal Conjugate-13 11/20/2013   Pneumococcal Polysaccharide-23 07/20/2006   Td 07/20/2006   Zoster Recombinat (Shingrix) 03/27/2018, 07/14/2018   Zoster, Live 09/09/2010    Conditions to be addressed/monitored:  Hypertension, Hyperlipidemia, Coronary Artery Disease, BPH, and Prediabetes  Care Plan : [place]  Updates made by Viona Gilmore, [place] since 07/21/2020 12:00 AM     Problem: Problem: Hyperlipidemia, Coronary Artery Disease, BPH, and Prediabetes      Long-Range Goal: Patient-Specific Goal   Start Date: 07/15/2020  Expected End Date: 07/15/2021  This Visit's Progress: On track  Priority: High  Note:   Current Barriers:  Unable to independently monitor therapeutic efficacy  Pharmacist Clinical Goal(s):  Patient will achieve adherence to monitoring guidelines and medication adherence to achieve therapeutic efficacy through collaboration with PharmD and provider.   Interventions: 1:1 collaboration with [name], Collin G, MD regarding development and update of comprehensive plan of care as evidenced by provider attestation and co-signature Inter-disciplinary care team collaboration (see longitudinal plan of care) Comprehensive medication review performed; medication list updated in electronic medical record  Hyperlipidemia: (LDL goal < 70) -Controlled -Current treatment: Simvastatin 40 mg 1 tablet daily at 6pm Omega 3 fatty acids 1400 mg 1 capsule daily -Medications previously tried: none  -Current dietary  patterns: did not discuss -Current exercise habits: did not discuss -Educated on Cholesterol goals;  Benefits of statin for ASCVD risk reduction; Exercise goal of 150 minutes per week; -Counseled on diet and exercise extensively Recommended to continue current medication  CAD (Goal: prevent heart events) -Controlled -Current treatment  Aspirin 81 mg 1 tablet daily Nitroglycerin 0.4 mg SL tablet as needed Metoprolol succinate 25 mg 1 tablet daily -Medications previously tried: n/a  -Counseled on diet and exercise extensively Recommended to continue current medication  Pre-diabetes (A1c goal <6.5%) -Controlled -Current medications: No medications -Medications previously tried: none  -Current home glucose readings fasting glucose: does not need to check post prandial glucose: does not need to check -Denies hypoglycemic/hyperglycemic symptoms -Current meal patterns:  breakfast: did not discuss  lunch: did not discuss   dinner: did not discuss  snacks: did not discuss  drinks: did not discuss  -Current exercise: did not discuss -Educated on A1c and blood sugar goals; Carbohydrate counting and/or plate method -Counseled to check feet daily and get yearly eye exams -Counseled on diet and exercise extensively  BPH (Goal: minimize symptoms) -Not ideally controlled -Current treatment  Finasteride 5 mg daily Saw palmetto 450 mg 1 capsule at bedtime Tamsulosin 0.4 mg 1 capsule daily -Medications previously tried: tamsulosin (ineffective)  -Recommended to continue current medication Patient is still having symptoms and unsure if it's a bladder vs prostate problem.  Imbalance (Goal: minimize imbalance) -Not ideally controlled -Current treatment  Gabapentin 2 capsules at bedtime  -Medications previously tried: n/a  - Patient does not wish to follow up with a specialist to prescribe this and wants his PCP to manage. Discussed with PCP who wants an in office visit to discuss  refilling.   Health Maintenance -Vaccine gaps: tetanus, COVID booster -Current therapy:  Grape seed 1 capsule daily Vitamin D3 1000 units 1 capsule at bedtime Coenzyme Q10  300 mg 1 capsule daily Glucosamine-chondroitin 1 tablet daily - not sure if its working Multivitamin 1 tablet daily Vitamin B-12 2500 mcg 1 tablet daily - vitamin 1000 mcg daily Probiotic once daily -Educated on Cost vs benefit of each product must be carefully weighed by individual consumer -Patient is satisfied with current therapy and denies issues -Recommended to continue current medication  Patient Goals/Self-Care Activities Patient will:  - take medications as prescribed target a minimum of 150 minutes of moderate intensity exercise weekly  Follow Up Plan: Telephone follow up appointment with care management team member scheduled for: 6 months       Medication Assistance: None required.  Patient affirms current coverage meets needs.  Compliance/Adherence/Medication fill history: Care Gaps: Covid booster, colonoscopy  Star-Rating Drugs: Simvastatin 22m - [facility] is:  [facility]Phone: [phone]: [address] [address] [place] [place] [place]Phone: [phone]: [phone] Uses pill box? Yes Pt endorses 99% compliance  We discussed: Current pharmacy is preferred with insurance plan and patient is satisfied with pharmacy services Patient decided to: Continue current medication management strategy  Care Plan and Follow Up Patient Decision:  Patient agrees to Care Plan and Follow-up.  Plan: Telephone follow up appointment with care management team member scheduled for:  6  months  MJeni Salles PharmD, BHooperPharmacist LTrout Lakeat [phone]

## 2020-07-21 NOTE — Patient Instructions (Signed)
Hi Collin [name],  It was great speaking with you again! Below is a summary of some of the topics we discussed.   Please reach out to me if you have any questions or need anything before our follow up!  Best, Maddie  Jeni Salles, PharmD, [place] [facility] [place]   Visit Information   Goals Addressed   None    Patient Care Plan: CCM Pharmacy Care Plan     Problem Identified: Problem: Hyperlipidemia, Coronary Artery Disease, BPH, and Prediabetes      Long-Range Goal: Patient-Specific Goal   Start Date: 07/15/2020  Expected End Date: 07/15/2021  This Visit's Progress: On track  Priority: High  Note:   Current Barriers:  Unable to independently monitor therapeutic efficacy  Pharmacist Clinical Goal(s):  Patient will achieve adherence to monitoring guidelines and medication adherence to achieve therapeutic efficacy through collaboration with PharmD and provider.   Interventions: 1:1 collaboration with Martinique, Betty G, MD regarding development and update of comprehensive plan of care as evidenced by provider attestation and co-signature Inter-disciplinary care team collaboration (see longitudinal plan of care) Comprehensive medication review performed; medication list updated in electronic medical record  Hyperlipidemia: (LDL goal < 70) -Controlled -Current treatment: Simvastatin 40 mg 1 tablet daily at 6pm Omega 3 fatty acids 1400 mg 1 capsule daily -Medications previously tried: none  -Current dietary patterns: did not discuss -Current exercise habits: did not discuss -Educated on Cholesterol goals;  Benefits of statin for ASCVD risk reduction; Exercise goal of 150 minutes per week; -Counseled on diet and exercise extensively Recommended to continue current medication  CAD (Goal: prevent heart events) -Controlled -Current treatment  Aspirin 81 mg 1 tablet daily Nitroglycerin 0.4 mg SL tablet as needed Metoprolol  succinate 25 mg 1 tablet daily -Medications previously tried: n/a  -Counseled on diet and exercise extensively Recommended to continue current medication  Pre-diabetes (A1c goal <6.5%) -Controlled -Current medications: No medications -Medications previously tried: none  -Current home glucose readings fasting glucose: does not need to check post prandial glucose: does not need to check -Denies hypoglycemic/hyperglycemic symptoms -Current meal patterns:  breakfast: did not discuss  lunch: did not discuss   dinner: did not discuss  snacks: did not discuss  drinks: did not discuss  -Current exercise: did not discuss -Educated on A1c and blood sugar goals; Carbohydrate counting and/or plate method -Counseled to check feet daily and get yearly eye exams -Counseled on diet and exercise extensively  BPH (Goal: minimize symptoms) -Not ideally controlled -Current treatment  Finasteride 5 mg daily Saw palmetto 450 mg 1 capsule at bedtime Tamsulosin 0.4 mg 1 capsule daily -Medications previously tried: tamsulosin (ineffective)  -Recommended to continue current medication Patient is still having symptoms and unsure if it's a bladder vs prostate problem.  Imbalance (Goal: minimize imbalance) -Not ideally controlled -Current treatment  Gabapentin 2 capsules at bedtime  -Medications previously tried: n/a  - Patient does not wish to follow up with a specialist to prescribe this and wants his PCP to manage. Discussed with PCP who wants an in office visit to discuss refilling.   Health Maintenance -Vaccine gaps: tetanus, COVID booster -Current therapy:  Grape seed 1 capsule daily Vitamin D3 1000 units 1 capsule at bedtime Coenzyme Q10 300 mg 1 capsule daily Glucosamine-chondroitin 1 tablet daily - not sure if its working Multivitamin 1 tablet daily Vitamin B-12 2500 mcg 1 tablet daily - vitamin 1000 mcg daily Probiotic once daily -Educated on Cost vs benefit of each product must  be  carefully weighed by individual consumer -Patient is satisfied with current therapy and denies issues -Recommended to continue current medication  Patient Goals/Self-Care Activities Patient will:  - take medications as prescribed target a minimum of 150 minutes of moderate intensity exercise weekly  Follow Up Plan: Telephone follow up appointment with care management team member scheduled for: 6 months       Patient verbalizes understanding of instructions provided today and agrees to view in [place].  Telephone follow up appointment with pharmacy team member scheduled for: 6 months  Viona Gilmore, [facility]

## 2020-07-23 ENCOUNTER — Ambulatory Visit (INDEPENDENT_AMBULATORY_CARE_PROVIDER_SITE_OTHER): Payer: PPO | Admitting: Family Medicine

## 2020-07-23 ENCOUNTER — Other Ambulatory Visit: Payer: Self-pay

## 2020-07-23 ENCOUNTER — Encounter: Payer: Self-pay | Admitting: Family Medicine

## 2020-07-23 VITALS — BP 124/70 | HR 67 | Resp 16 | Ht 73.0 in | Wt 194.0 lb

## 2020-07-23 DIAGNOSIS — M21372 Foot drop, left foot: Secondary | ICD-10-CM

## 2020-07-23 DIAGNOSIS — G609 Hereditary and idiopathic neuropathy, unspecified: Secondary | ICD-10-CM

## 2020-07-23 MED ORDER — GABAPENTIN 100 MG PO CAPS: 100.0000 mg | ORAL_CAPSULE | Freq: Every day | ORAL | 0 refills | Status: DC

## 2020-07-23 NOTE — Patient Instructions (Signed)
A few things to remember from today's visit:  Decrease dose of Gabapentin to 1 cap daily at bedtime and monitor for changes. If no changes you can continue every other day for another 4 weeks and discontinue. Please keep me informed. If you need refills please call your pharmacy. Do not use My Chart to request refills or for acute issues that need immediate attention.    Please be sure medication list is accurate. If a new problem present, please set up appointment sooner than planned today.

## 2020-07-23 NOTE — Progress Notes (Signed)
Chief Complaint  Patient presents with   Medication Refill   HPI: Collin [name] is a 80 y.o. male with history of essential tremor, dyslipidemia, CAD, and BPH here today requesting refills on Gabapentin 100 mg. Medication has been prescribed by Dr Tamala Julian, started after lumbar laminectomy.  He is not sure about medication indications and wonders if he needs to continue taking it. History of peripheral neuropathy he has not noted lower extremity numbness, burning, or tingling.  Currently he is on gabapentin 100 mg 2 caps at bedtime.  Per records review he was initially evaluated by Dr. Tamala Julian because foot drop on the left side, s/p lumbar laminectomy (01/20/18), and unstable gait. He has seen neurologist, Dr. Posey Pronto, he began having left dropfoot around 2015. NCS/EMG of the legs 11/30/2016:Electrophysiologic findings are most consistent with a chronic sensorimotor polyneuropathy, axon loss in type, affecting the bilateral lower extremities; severe in degree electrically. There is also evidence of a superimposed L5 radiculopathy affecting bilateral lower extremities, which is significantly worse on the left and severe in degree electrically.  Negative for saddle anesthesia or changes in bowel/bladder function.  He was having some lower back pain and "maybe" some LLE achy pain. He is not longer having back pain. He did complete PT and is still doing some balance exercises at home. He does not use a cane or walker. Problem does not interfere with ADLs or IADLs. He has tolerated medication well, no side effects.  Review of Systems  Constitutional:  Negative for activity change, appetite change, fatigue and fever.  Respiratory:  Negative for cough, shortness of breath and wheezing.   Cardiovascular:  Negative for chest pain and leg swelling.  Gastrointestinal:  Negative for abdominal pain, nausea and vomiting.  Genitourinary:  Negative for decreased urine volume, dysuria and  hematuria.  Musculoskeletal:  Positive for gait problem.  Neurological:  Negative for syncope and headaches.  Rest see pertinent positives and negatives per HPI.  Current Outpatient Medications on File Prior to Visit  Medication Sig Dispense Refill   aspirin 81 MG tablet Take 1 tablet (81 mg total) by mouth at bedtime. 30 tablet    Cholecalciferol (VITAMIN D3) 125 MCG (5000 UT) CAPS Take 5,000 Units by mouth at bedtime.     Coenzyme Q10 300 MG CAPS Take 300 mg by mouth daily.     Cyanocobalamin (VITAMIN B-12 PO) Take 2,500 mcg by mouth daily.     finasteride (PROSCAR) 5 MG tablet TAKE 1 TABLET(5 MG) BY MOUTH DAILY 90 tablet 0   fish oil-omega-3 fatty acids 1000 MG capsule Take 1 g by mouth daily.     Glucosamine-Chondroitin (MOVE FREE PO) Take 1 tablet by mouth daily.     metoprolol succinate (TOPROL-XL) 25 MG 24 hr tablet TAKE 1 TABLET(25 MG) BY MOUTH DAILY 90 tablet 2   Multiple Vitamin (MULTIVITAMIN) tablet Take 1 tablet by mouth daily.     nitroGLYCERIN (NITROSTAT) 0.4 MG SL tablet Place 1 tablet (0.4 mg total) under the tongue every 5 (five) minutes as needed for chest pain. 25 tablet 3   Probiotic Product (PROBIOTIC ADVANCED PO) Take 1 capsule by mouth daily.     Saw Palmetto 450 MG CAPS Take 450 mg by mouth at bedtime.     simvastatin (ZOCOR) 40 MG tablet TAKE 1 TABLET(40 MG) BY MOUTH DAILY AT 6 PM 90 tablet 2   tamsulosin (FLOMAX) 0.4 MG CAPS capsule TAKE 1 CAPSULE(0.4 MG) BY MOUTH DAILY 30 capsule 3   No  current facility-administered medications on file prior to visit.   Past Medical History:  Diagnosis Date   Allergy    Arthritis    BPH (benign prostatic hyperplasia)    CAD (coronary artery disease)    Cataract    Diverticulosis of colon    Hx of adenomatous colonic polyps 05/22/2014   Hyperlipidemia    Hypertension    MYOCARDIAL INFARCTION, HX OF 07/20/2006   Qualifier: Diagnosis of  By: Leanne Chang MD, Bruce     Allergies  Allergen Reactions   Penicillins Rash and Other  (See Comments)    PATIENT HAS HAD A PCN REACTION WITH IMMEDIATE RASH, FACIAL/TONGUE/THROAT SWELLING, SOB, OR LIGHTHEADEDNESS WITH HYPOTENSION:  #  #  YES  #  #  Has patient had a PCN reaction causing severe rash involving mucus membranes or skin necrosis: No Has patient had a PCN reaction that required hospitalization: No Has patient had a PCN reaction occurring within the last 10 years: No If all of the above answers are "NO", then may proceed with Cephalosporin use.    Social History   Socioeconomic History   Marital status: Married    Spouse name: Not on file   Number of children: 3   Years of education: 16   Highest education level: Not on file  Occupational History   Occupation: retired  Tobacco Use   Smoking status: Never   Smokeless tobacco: Never  Vaping Use   Vaping Use: Never used  Substance and Sexual Activity   Alcohol use: Yes    Alcohol/week: 1.0 standard drink    Types: 1 Glasses of wine per week    Comment: Occasional beer.   Drug use: No   Sexual activity: Not on file  Other Topics Concern   Not on file  Social History Narrative   Lives with wife in a 2 story home.  Has 3 children.     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    Education: college.    Occasional beer, no smoking, no drugs   Social Determinants of Radio broadcast assistant Strain: Low Risk    Difficulty of Paying Living Expenses: Not hard at all  Food Insecurity: No Food Insecurity   Worried About Charity fundraiser in the Last Year: Never true   Ran Out of Food in the Last Year: Never true  Transportation Needs: No Transportation Needs   Lack of Transportation (Medical): No   Lack of Transportation (Non-Medical): No  Physical Activity: Sufficiently Active   Days of Exercise per Week: 6 days   Minutes of Exercise per Session: 30 min  Stress: No Stress Concern Present   Feeling of Stress : Not at all  Social Connections: Moderately Integrated   Frequency of  Communication with Friends and Family: More than three times a week   Frequency of Social Gatherings with Friends and Family: Three times a week   Attends Religious Services: More than 4 times per year   Active Member of Clubs or Organizations: No   Attends Archivist Meetings: Never   Marital Status: Married    Vitals:   07/23/20 1548  BP: 124/70  Pulse: 67  Resp: 16  SpO2: 92%   Body mass index is 25.6 kg/m.  Physical Exam Nursing note reviewed.  Constitutional:      General: He is not in acute distress.    Appearance: He is well-developed.  HENT:     Head: Normocephalic and atraumatic.  Eyes:  Conjunctiva/sclera: Conjunctivae normal.  Cardiovascular:     Rate and Rhythm: Normal rate and regular rhythm.     Pulses:          Posterior tibial pulses are 2+ on the right side and 2+ on the left side.     Heart sounds: No murmur heard. Pulmonary:     Effort: Pulmonary effort is normal. No respiratory distress.     Breath sounds: Normal breath sounds.  Abdominal:     Palpations: Abdomen is soft. There is no mass.     Tenderness: There is no abdominal tenderness.  Skin:    General: Skin is warm.     Findings: No erythema or rash.  Neurological:     Mental Status: He is alert and oriented to person, place, and time.     Cranial Nerves: No cranial nerve deficit.     Comments: Left LE muscle atrophy. Gait is mildly unstable, not assisted.  Psychiatric:     Comments: Well groomed, good eye contact.   ASSESSMENT AND PLAN:  [name] was seen today for medication refill.  Diagnoses and all orders for this visit:  Foot drop, left Since 2015. S/P lumbar laminectomy 12/2017. Problem is a stable. He completed PT. Fall precautions discussed. He would like to try to decrease gabapentin, so decreased from 100 mg 200 mg daily and in 3-4 weeks if he does not notice any difference he can start weaning off medication.  -     gabapentin (NEURONTIN) 100 MG  capsule; Take 1 capsule (100 mg total) by mouth at bedtime.  Idiopathic peripheral neuropathy NCS/EMG in 11/2016. Mild lower extremity weakness, stable. Instructed to decrease gabapentin from 200 mg daily to 100 mg daily and in 3 to 4 weeks he can continue every other day. We discussed some side effects of medication. I will be glad to take over prescription if he feels like Gabapentin is helping and decides to continue taking it; but will need to follow every 6 months. Because he is already getting 3 months supply,I will continue doing so. He will let me know if he has a new symptoms of is weakness gets worse when changing dose of gabapentin.  -     gabapentin (NEURONTIN) 100 MG capsule; Take 1 capsule (100 mg total) by mouth at bedtime.   Return in about 5 months (around 12/23/2020) for cpe.   Murrell Elizondo G. Martinique, MD  [facility]. [place] office.

## 2020-08-12 ENCOUNTER — Telehealth: Payer: Self-pay | Admitting: Pharmacist

## 2020-08-12 DIAGNOSIS — M21372 Foot drop, left foot: Secondary | ICD-10-CM

## 2020-08-12 DIAGNOSIS — G609 Hereditary and idiopathic neuropathy, unspecified: Secondary | ICD-10-CM

## 2020-08-12 NOTE — Telephone Encounter (Signed)
Patient called to let me know and make PCP aware that he did try the gabapentin 100 mg once a day for 3 weeks but noticed that his balance got worse. He just started taking the gabapentin twice a day again and feels that this is much better for his balance. He wanted to see if a new prescription could be sent in for twice a day to [facility] on [address] that he will use once he runs out of his current supply.    Will route to PCP to make her aware of the request.

## 2020-08-13 ENCOUNTER — Other Ambulatory Visit: Payer: Self-pay | Admitting: Family Medicine

## 2020-08-13 DIAGNOSIS — M21372 Foot drop, left foot: Secondary | ICD-10-CM

## 2020-08-13 DIAGNOSIS — G609 Hereditary and idiopathic neuropathy, unspecified: Secondary | ICD-10-CM

## 2020-08-13 MED ORDER — GABAPENTIN 100 MG PO CAPS: 100.0000 mg | ORAL_CAPSULE | Freq: Two times a day (BID) | ORAL | 0 refills | Status: DC

## 2020-08-15 DIAGNOSIS — L821 Other seborrheic keratosis: Secondary | ICD-10-CM | POA: Diagnosis not present

## 2020-08-15 DIAGNOSIS — L578 Other skin changes due to chronic exposure to nonionizing radiation: Secondary | ICD-10-CM | POA: Diagnosis not present

## 2020-08-15 DIAGNOSIS — L570 Actinic keratosis: Secondary | ICD-10-CM | POA: Diagnosis not present

## 2020-08-15 DIAGNOSIS — L814 Other melanin hyperpigmentation: Secondary | ICD-10-CM | POA: Diagnosis not present

## 2020-08-15 DIAGNOSIS — D225 Melanocytic nevi of trunk: Secondary | ICD-10-CM | POA: Diagnosis not present

## 2020-08-15 DIAGNOSIS — Z85828 Personal history of other malignant neoplasm of skin: Secondary | ICD-10-CM | POA: Diagnosis not present

## 2020-09-01 ENCOUNTER — Telehealth: Payer: Self-pay | Admitting: Pharmacist

## 2020-09-01 NOTE — Chronic Care Management (AMB) (Signed)
    Chronic Care Management Pharmacy Assistant   Name: Collin [name]  MRN: [id number] DOB: 1940-08-15   Reason for Encounter: General Assessment Call    Recent office visits:  07-23-2020 Martinique, Betty G, MD - Patient presented for foot drop and other concerns. Changed Gabapentin to 100 mg daily at bedtime.  Recent consult visits:  None  Hospital visits:  None in previous 6 months  Medications: Outpatient Encounter Medications as of 09/01/2020  Medication Sig   aspirin 81 MG tablet Take 1 tablet (81 mg total) by mouth at bedtime.   Cholecalciferol (VITAMIN D3) 125 MCG (5000 UT) CAPS Take 5,000 Units by mouth at bedtime.   Coenzyme Q10 300 MG CAPS Take 300 mg by mouth daily.   Cyanocobalamin (VITAMIN B-12 PO) Take 2,500 mcg by mouth daily.   finasteride (PROSCAR) 5 MG tablet TAKE 1 TABLET(5 MG) BY MOUTH DAILY   fish oil-omega-3 fatty acids 1000 MG capsule Take 1 g by mouth daily.   gabapentin (NEURONTIN) 100 MG capsule Take 1 capsule (100 mg total) by mouth 2 (two) times daily.   Glucosamine-Chondroitin (MOVE FREE PO) Take 1 tablet by mouth daily.   metoprolol succinate (TOPROL-XL) 25 MG 24 hr tablet TAKE 1 TABLET(25 MG) BY MOUTH DAILY   Multiple Vitamin (MULTIVITAMIN) tablet Take 1 tablet by mouth daily.   nitroGLYCERIN (NITROSTAT) 0.4 MG SL tablet Place 1 tablet (0.4 mg total) under the tongue every 5 (five) minutes as needed for chest pain.   Probiotic Product (PROBIOTIC ADVANCED PO) Take 1 capsule by mouth daily.   Saw Palmetto 450 MG CAPS Take 450 mg by mouth at bedtime.   simvastatin (ZOCOR) 40 MG tablet TAKE 1 TABLET(40 MG) BY MOUTH DAILY AT 6 PM   tamsulosin (FLOMAX) 0.4 MG CAPS capsule TAKE 1 CAPSULE(0.4 MG) BY MOUTH DAILY   No facility-administered encounter medications on file as of 09/01/2020.  Notes: Call to patient for General Assessment, he notes that he spoke with Pharmacist in July about his gabapentin and they planned on him taking the 100 mg daily but he would  like to start taking the 200 mg as his balance has been not as good as he thinks it could or should be. Made CPP aware. He noted he is good with his other medications. He reports his appetite is good he has been trying to eat healthy. He has been playing golf for physical activity. He reports no other concerns or issues at this time. He is aware of his follow up appointment with Pharmacist in December.  Care Gaps: Colonoscopy - Overdue Flu Vaccine - Overdue COVID Booster #4 Therapist, music) - Overdue  CCM F/U - Scheduled for  AWV -  Scheduled for 01-05-2021  Star Rating Drugs:  Simvastatin '40mg'$  - Last filled 08-29-2020 90 DS at [place] Pharmacist Assistant [phone]

## 2020-09-08 ENCOUNTER — Other Ambulatory Visit: Payer: Self-pay | Admitting: Family Medicine

## 2020-09-08 DIAGNOSIS — N401 Enlarged prostate with lower urinary tract symptoms: Secondary | ICD-10-CM

## 2020-09-08 DIAGNOSIS — R351 Nocturia: Secondary | ICD-10-CM

## 2020-09-15 NOTE — Progress Notes (Signed)
Call to patient to be sure he was aware his gabapentin prescription was sent in to the pharmacy. Left message on [place] Pharmacist Assistant [phone]

## 2020-09-16 ENCOUNTER — Other Ambulatory Visit: Payer: Self-pay | Admitting: Family Medicine

## 2020-09-16 ENCOUNTER — Telehealth: Payer: Self-pay | Admitting: Pharmacist

## 2020-09-16 DIAGNOSIS — M21372 Foot drop, left foot: Secondary | ICD-10-CM

## 2020-09-16 DIAGNOSIS — G609 Hereditary and idiopathic neuropathy, unspecified: Secondary | ICD-10-CM

## 2020-09-16 NOTE — Chronic Care Management (AMB) (Signed)
    Chronic Care Management Pharmacy Assistant   Name: Rorey Rauf Damron  MRN: [id number] DOB: [birth year]  Received a call from patient stated that he called his pharmacy as he is out of his Gabapentin, per prior messages he is to be taking 100 mg twice daily and wants a 90 DS of 180 tabs. He stated he was told it was too soon for him to pick up and he has none.  Call to pharmacy spoke to [facility] she stated they received the prescription today for his gabapentin and have to call his insurance and will notify him if it went through and is ready to be picked up  Last fill date with [facility] was 07-25-20 90 DS pt was taking once a day then increased to 2x a day during that time  Call back to pharmacy spoke to [facility] whom advised she was able to process the medication would be ready in 15 min/ Call back to advise Patient it should be ready to be picked up, he stated ok. He is also requesting 47 DS will make office aware for next fill date.  Medications: Outpatient Encounter Medications as of 09/16/2020  Medication Sig   aspirin 81 MG tablet Take 1 tablet (81 mg total) by mouth at bedtime.   Cholecalciferol (VITAMIN D3) 125 MCG (5000 UT) CAPS Take 5,000 Units by mouth at bedtime.   Coenzyme Q10 300 MG CAPS Take 300 mg by mouth daily.   Cyanocobalamin (VITAMIN B-12 PO) Take 2,500 mcg by mouth daily.   finasteride (PROSCAR) 5 MG tablet TAKE 1 TABLET(5 MG) BY MOUTH DAILY   fish oil-omega-3 fatty acids 1000 MG capsule Take 1 g by mouth daily.   gabapentin (NEURONTIN) 100 MG capsule Take 1 capsule (100 mg total) by mouth 2 (two) times daily.   Glucosamine-Chondroitin (MOVE FREE PO) Take 1 tablet by mouth daily.   metoprolol succinate (TOPROL-XL) 25 MG 24 hr tablet TAKE 1 TABLET(25 MG) BY MOUTH DAILY   Multiple Vitamin (MULTIVITAMIN) tablet Take 1 tablet by mouth daily.   nitroGLYCERIN (NITROSTAT) 0.4 MG SL tablet Place 1 tablet (0.4 mg total) under the tongue every 5 (five) minutes as needed for chest pain.    Probiotic Product (PROBIOTIC ADVANCED PO) Take 1 capsule by mouth daily.   Saw Palmetto 450 MG CAPS Take 450 mg by mouth at bedtime.   simvastatin (ZOCOR) 40 MG tablet TAKE 1 TABLET(40 MG) BY MOUTH DAILY AT 6 PM   tamsulosin (FLOMAX) 0.4 MG CAPS capsule TAKE 1 CAPSULE(0.4 MG) BY MOUTH DAILY   No facility-administered encounter medications on file as of 09/16/2020.    Care Gaps: Colonoscopy - Overdue Flu Vaccine - Overdue COVID Booster #4 Therapist, music) - Overdue  CCM F/U - Scheduled for  AWV -  Scheduled for 01-05-2021   Star Rating Drugs: Simvastatin '40mg'$  - Last filled 08-29-2020 90 DS at [place] Pharmacist Assistant [phone]

## 2020-10-09 ENCOUNTER — Telehealth: Payer: Self-pay | Admitting: Pharmacist

## 2020-10-09 NOTE — Chronic Care Management (AMB) (Signed)
    Chronic Care Management Pharmacy Assistant   Name: Collin [name]  MRN: [id number] DOB: 1940-05-16  Reason for Encounter: Gabapentin follow up   Request sent to Jeni Salles to update patients prescription for gabapentin to reflect 90 DS per patient request as of last fill date and CCM note. Will call patient to make aware once complete. Patient should be due to pick up next fill on or after 10-16-20  10-13-2020 Per Message from Pharmacist, request for fill of 90 DS sent to Dr Martinique but she does not normally fill 90 DS for this medication. Call to patient to advise,  he reports he will give the office a call as he has always had 90 DS  for this medication.    Medications: Outpatient Encounter Medications as of 10/09/2020  Medication Sig   aspirin 81 MG tablet Take 1 tablet (81 mg total) by mouth at bedtime.   Cholecalciferol (VITAMIN D3) 125 MCG (5000 UT) CAPS Take 5,000 Units by mouth at bedtime.   Coenzyme Q10 300 MG CAPS Take 300 mg by mouth daily.   Cyanocobalamin (VITAMIN B-12 PO) Take 2,500 mcg by mouth daily.   finasteride (PROSCAR) 5 MG tablet TAKE 1 TABLET(5 MG) BY MOUTH DAILY   fish oil-omega-3 fatty acids 1000 MG capsule Take 1 g by mouth daily.   gabapentin (NEURONTIN) 100 MG capsule Take 1 capsule (100 mg total) by mouth 2 (two) times daily.   Glucosamine-Chondroitin (MOVE FREE PO) Take 1 tablet by mouth daily.   metoprolol succinate (TOPROL-XL) 25 MG 24 hr tablet TAKE 1 TABLET(25 MG) BY MOUTH DAILY   Multiple Vitamin (MULTIVITAMIN) tablet Take 1 tablet by mouth daily.   nitroGLYCERIN (NITROSTAT) 0.4 MG SL tablet Place 1 tablet (0.4 mg total) under the tongue every 5 (five) minutes as needed for chest pain.   Probiotic Product (PROBIOTIC ADVANCED PO) Take 1 capsule by mouth daily.   Saw Palmetto 450 MG CAPS Take 450 mg by mouth at bedtime.   simvastatin (ZOCOR) 40 MG tablet TAKE 1 TABLET(40 MG) BY MOUTH DAILY AT 6 PM   tamsulosin (FLOMAX) 0.4 MG CAPS capsule TAKE 1  CAPSULE(0.4 MG) BY MOUTH DAILY   No facility-administered encounter medications on file as of 10/09/2020.    Care Gaps: Colonoscopy - Overdue Flu Vaccine - Overdue COVID Booster #4 [employer]) - Overdue  CCM F/U - Scheduled for 01-13-21 AWV -  Scheduled for 01-05-2021  Star Rating Drugs: Simvastatin '40mg'$  - Last filled 08-29-2020 90 DS at [place] Pharmacist Assistant [phone]

## 2020-10-15 ENCOUNTER — Other Ambulatory Visit: Payer: Self-pay | Admitting: Family Medicine

## 2020-10-15 NOTE — Telephone Encounter (Signed)
Somehow Proscar has been filled from our office, he was supposed to establish with urologist. On 01/15/20 I saw him for BPH symptoms, not better with Flomax, so this one was d/c and Proscar started. Urology referral was placed during this visit. It seems like he is still on Flomax. When was his last visit with urologist? He cannot be on Flomax and Proscar. Thanks, BJ

## 2020-10-16 ENCOUNTER — Other Ambulatory Visit: Payer: Self-pay | Admitting: Family Medicine

## 2020-10-25 ENCOUNTER — Other Ambulatory Visit: Payer: Self-pay | Admitting: Family Medicine

## 2020-10-28 ENCOUNTER — Telehealth: Payer: Self-pay | Admitting: Pharmacist

## 2020-10-28 NOTE — Chronic Care Management (AMB) (Signed)
Chronic Care Management Pharmacy Assistant   Name: Collin [name]  MRN: [id number] DOB: Feb 01, 1940  Reason for Encounter: General Assessment Call   Conditions to be addressed/monitored: HLD   Recent office visits:  None  Recent consult visits:  None  Hospital visits:  None in previous 6 months  Medications: Outpatient Encounter Medications as of 10/28/2020  Medication Sig   aspirin 81 MG tablet Take 1 tablet (81 mg total) by mouth at bedtime.   Cholecalciferol (VITAMIN D3) 125 MCG (5000 UT) CAPS Take 5,000 Units by mouth at bedtime.   Coenzyme Q10 300 MG CAPS Take 300 mg by mouth daily.   Cyanocobalamin (VITAMIN B-12 PO) Take 2,500 mcg by mouth daily.   finasteride (PROSCAR) 5 MG tablet TAKE 1 TABLET(5 MG) BY MOUTH DAILY   fish oil-omega-3 fatty acids 1000 MG capsule Take 1 g by mouth daily.   gabapentin (NEURONTIN) 100 MG capsule Take 1 capsule (100 mg total) by mouth 2 (two) times daily.   Glucosamine-Chondroitin (MOVE FREE PO) Take 1 tablet by mouth daily.   metoprolol succinate (TOPROL-XL) 25 MG 24 hr tablet TAKE 1 TABLET(25 MG) BY MOUTH DAILY   Multiple Vitamin (MULTIVITAMIN) tablet Take 1 tablet by mouth daily.   nitroGLYCERIN (NITROSTAT) 0.4 MG SL tablet Place 1 tablet (0.4 mg total) under the tongue every 5 (five) minutes as needed for chest pain.   Probiotic Product (PROBIOTIC ADVANCED PO) Take 1 capsule by mouth daily.   Saw Palmetto 450 MG CAPS Take 450 mg by mouth at bedtime.   simvastatin (ZOCOR) 40 MG tablet TAKE 1 TABLET(40 MG) BY MOUTH DAILY AT 6 PM   tamsulosin (FLOMAX) 0.4 MG CAPS capsule TAKE 1 CAPSULE(0.4 MG) BY MOUTH DAILY   No facility-administered encounter medications on file as of 10/28/2020.  10/28/2020 Name: Collin [name] MRN: [id number] DOB: [birth year] Collin [name] is a 80 y.o. year old male who is a primary care patient of Martinique, Malka So, MD.  Comprehensive medication review performed; Spoke to patient regarding cholesterol  Lipid  Panel    Component Value Date/Time   CHOL 130 11/05/2019 0828   TRIG 87 11/05/2019 0828   HDL 59 11/05/2019 0828   LDLCALC 54 11/05/2019 0828    10-year ASCVD risk score: The ASCVD Risk score (Arnett DK, et al., 2019) failed to calculate for the following reasons:   The 2019 ASCVD risk score is only valid for ages 74 to 30  Current antihyperlipidemic regimen:  Simvastatin 40 mg 1 tablet daily at 6pm Omega 3 fatty acids 1400 mg 1 capsule daily ASCVD risk enhancing conditions: age >82 What recent interventions/DTPs have been made by any provider to improve Cholesterol control since last CPP Visit: Patient reports no changes Any recent hospitalizations or ED visits since last visit with CPP? No Patient reports he is doing well, he reports he did not attempt to contact Dr Martinique about the 41 DS for his gabapentin, he reports that the pharmacy is trying to get all of his med's in a way that he can pick them up all at once instead of scattered, he reports that should start in November. He reports no issues side effects or concerns at this time. He reports he will be sure to call me if anything comes up.  Adherence Review: Does the patient have >5 day gap between last estimated fill dates? No   Care Gaps: Colonoscopy - Overdue Flu Vaccine - Overdue COVID Booster #4 Therapist, music) - Overdue  CCM F/U -  Scheduled for 01-13-21 AWV -  Scheduled for 01-05-2021   Star Rating Drugs: Simvastatin 40mg  - Last filled 08-29-2020 90 DS at [place] Pharmacist Assistant [phone]

## 2020-10-31 ENCOUNTER — Other Ambulatory Visit: Payer: Self-pay | Admitting: Family Medicine

## 2020-10-31 DIAGNOSIS — G609 Hereditary and idiopathic neuropathy, unspecified: Secondary | ICD-10-CM

## 2020-10-31 DIAGNOSIS — M21372 Foot drop, left foot: Secondary | ICD-10-CM

## 2020-10-31 MED ORDER — GABAPENTIN 100 MG PO CAPS: 100.0000 mg | ORAL_CAPSULE | Freq: Two times a day (BID) | ORAL | 0 refills | Status: DC

## 2020-11-24 ENCOUNTER — Other Ambulatory Visit: Payer: Self-pay | Admitting: Family Medicine

## 2020-11-24 DIAGNOSIS — N401 Enlarged prostate with lower urinary tract symptoms: Secondary | ICD-10-CM

## 2020-11-28 ENCOUNTER — Telehealth: Payer: Self-pay | Admitting: Family Medicine

## 2020-11-28 DIAGNOSIS — E875 Hyperkalemia: Secondary | ICD-10-CM

## 2020-11-28 DIAGNOSIS — E785 Hyperlipidemia, unspecified: Secondary | ICD-10-CM

## 2020-11-28 DIAGNOSIS — R7303 Prediabetes: Secondary | ICD-10-CM

## 2020-11-28 DIAGNOSIS — R748 Abnormal levels of other serum enzymes: Secondary | ICD-10-CM

## 2020-11-28 NOTE — Telephone Encounter (Signed)
Pt is calling to sch cpe labs in advance . Pt did have cpe labs in advance last year if ok please put order in

## 2020-11-28 NOTE — Telephone Encounter (Signed)
Pt has been sch for 12-03-2020

## 2020-11-28 NOTE — Telephone Encounter (Signed)
Orders are in, just needs lab appointment. Thank you!

## 2020-12-03 ENCOUNTER — Other Ambulatory Visit (INDEPENDENT_AMBULATORY_CARE_PROVIDER_SITE_OTHER): Payer: PPO

## 2020-12-03 ENCOUNTER — Other Ambulatory Visit: Payer: Self-pay

## 2020-12-03 DIAGNOSIS — E875 Hyperkalemia: Secondary | ICD-10-CM

## 2020-12-03 DIAGNOSIS — R7303 Prediabetes: Secondary | ICD-10-CM | POA: Diagnosis not present

## 2020-12-03 DIAGNOSIS — E785 Hyperlipidemia, unspecified: Secondary | ICD-10-CM

## 2020-12-03 DIAGNOSIS — R748 Abnormal levels of other serum enzymes: Secondary | ICD-10-CM

## 2020-12-03 LAB — COMPREHENSIVE METABOLIC PANEL
ALT: 24 U/L (ref 0–53)
AST: 31 U/L (ref 0–37)
Albumin: 4.1 g/dL (ref 3.5–5.2)
Alkaline Phosphatase: 39 U/L (ref 39–117)
BUN: 21 mg/dL (ref 6–23)
CO2: 29 mEq/L (ref 19–32)
Calcium: 9.3 mg/dL (ref 8.4–10.5)
Chloride: 104 mEq/L (ref 96–112)
Creatinine, Ser: 1.16 mg/dL (ref 0.40–1.50)
GFR: 58.69 mL/min — ABNORMAL LOW (ref >60.00)
Glucose, Bld: 106 mg/dL — ABNORMAL HIGH (ref 70–99)
Potassium: 4.4 mEq/L (ref 3.5–5.1)
Sodium: 138 mEq/L (ref 135–145)
Total Bilirubin: 0.9 mg/dL (ref 0.2–1.2)
Total Protein: 6.8 g/dL (ref 6.0–8.3)

## 2020-12-03 LAB — LIPID PANEL
Cholesterol: 122 mg/dL (ref 0–200)
HDL: 61.1 mg/dL (ref >39.00)
LDL Cholesterol: 52 mg/dL (ref 0–99)
NonHDL: 60.91
Total CHOL/HDL Ratio: 2
Triglycerides: 47 mg/dL (ref 0.0–149.0)
VLDL: 9.4 mg/dL (ref 0.0–40.0)

## 2020-12-03 LAB — HEMOGLOBIN A1C: Hgb A1c MFr Bld: 6.3 % (ref 4.6–6.5)

## 2020-12-05 NOTE — Progress Notes (Signed)
HPI: Collin [name] is a 80 y.o.male here today for his routine physical examination.  Last CPE: 11/09/19. Collin [name] lives with his wife.  Regular exercise 3 or more times per week: Gyn elliptical or other machines, wt ligtig, TWF, Thursday golf and F walks 3 miles. Following a healthy diet: Trying to do so, decreased ice cream and sweet tea.  Chronic medical problems: Sensorineural hearing loss, CAD, lower back pain, OA,peripheral neuropathy among some.  BPH: Collin [name] is on Flomax 0.4 mg daily, which Collin [name] was supposed to discontinue because was not helping. Collin [name] is also taking Proscar 5 mg daily. Tolerating medication well. According to pt, Collin [name] saw urologist since his last visit and had bladder US, residual urine in bladder after voiding. Completed pelvic floor therapy, which Collin [name] does not think it helps. No changes in treatment. Collin [name] is not sure if medications are helping. Urinary frequency during the day. Nocturia x 1 at least. + Urgency.  Collin [name] is not sure when Collin [name] is supposed to follow.  Immunization History  Administered Date(s) Administered   Fluad Quad(high Dose 65+) 11/05/2019   Influenza Whole 12/04/2008, 01/20/2010, 07/26/2010   Influenza, High Dose Seasonal PF 10/16/2015, 11/17/2016, 11/02/2017   Influenza,inj,Quad PF,6+ Mos 11/20/2012, 11/20/2013, 10/08/2014   Influenza-Unspecified 11/17/2016, 10/17/2020   PFIZER Comirnaty(Gray Top)Covid-19 Tri-Sucrose Vaccine 10/17/2020   PFIZER(Purple Top)SARS-COV-2 Vaccination 03/02/2019, 03/27/2019, 10/30/2019   Pneumococcal Conjugate-13 11/20/2013   Pneumococcal Polysaccharide-23 07/20/2006   Td 07/20/2006   Zoster Recombinat (Shingrix) 03/27/2018, 07/14/2018   Zoster, Live 09/09/2010   Health Maintenance  Topic Date Due   COLONOSCOPY (Pts 45-31yrs Insurance coverage will need to be confirmed)  05/13/2017   COVID-19 Vaccine (5 - Booster for Pfizer series) 12/12/2020   Pneumonia Vaccine 35+ Years old  Completed   INFLUENZA VACCINE   Completed   Zoster Vaccines- Shingrix  Completed   HPV VACCINES  Aged Out   TETANUS/TDAP  Discontinued   Negative for high alcohol intake or tobacco use.  -Concerns and/or follow up today:  Had labs recently.  HLD: Collin [name] is on Simvastatin 40 mg daily. CAD, Collin [name] is on Metoprolol succinate 25 mg daily. Collin [name] follows with cardiologist, last seen in 12/2019.  Lab Results  Component Value Date   CHOL 122 12/03/2020   HDL 61.10 12/03/2020   LDLCALC 52 12/03/2020   TRIG 47.0 12/03/2020   CHOLHDL 2 12/03/2020   Prediabetes: Negative for polydipsia,polyuria, or polyphagia.  Lab Results  Component Value Date   HGBA1C 6.3 12/03/2020   Lab Results  Component Value Date   CREATININE 1.16 12/03/2020   BUN 21 12/03/2020   NA 138 12/03/2020   K 4.4 12/03/2020   CL 104 12/03/2020   CO2 29 12/03/2020   Lab Results  Component Value Date   ALT 24 12/03/2020   AST 31 12/03/2020   ALKPHOS 39 12/03/2020   BILITOT 0.9 12/03/2020   Peripheral neuropathy and left foot drop, s/p lumbar laminectomy. Gabapentin 100 mg bid has helped. Unstable gait. Collin [name] is not using assistance.  C/O trouble finding words, stable for 3 years. No associated forgetfulness. Collin [name] does not have difficulty memorizing new information.  Review of Systems  Constitutional:  Negative for activity change, appetite change, fatigue and fever.  HENT:  Negative for mouth sores, nosebleeds and sore throat.   Eyes:  Negative for redness and visual disturbance.  Respiratory:  Negative for cough, shortness of breath and wheezing.   Cardiovascular:  Negative for chest pain, palpitations and leg swelling.  Gastrointestinal:  Negative for abdominal pain, blood in stool, nausea and vomiting.  Endocrine: Negative for cold intolerance and heat intolerance.  Genitourinary:  Positive for frequency. Negative for decreased urine volume, dysuria, flank pain, genital sores, hematuria and testicular pain.  Musculoskeletal:  Positive for  arthralgias and gait problem.  Skin:  Negative for color change and rash.  Allergic/Immunologic: Positive for environmental allergies.  Neurological:  Negative for syncope, weakness and headaches.  Psychiatric/Behavioral:  Negative for confusion and sleep disturbance.   All other systems reviewed and are negative.  Current Outpatient Medications on File Prior to Visit  Medication Sig Dispense Refill   aspirin 81 MG tablet Take 1 tablet (81 mg total) by mouth at bedtime. 30 tablet    Cholecalciferol (VITAMIN D3) 125 MCG (5000 UT) CAPS Take 5,000 Units by mouth at bedtime.     Coenzyme Q10 300 MG CAPS Take 300 mg by mouth daily.     Cyanocobalamin (VITAMIN B-12 PO) Take 2,500 mcg by mouth daily.     fish oil-omega-3 fatty acids 1000 MG capsule Take 1 g by mouth daily.     gabapentin (NEURONTIN) 100 MG capsule Take 1 capsule (100 mg total) by mouth 2 (two) times daily. 180 capsule 0   Glucosamine-Chondroitin (MOVE FREE PO) Take 1 tablet by mouth daily.     metoprolol succinate (TOPROL-XL) 25 MG 24 hr tablet TAKE 1 TABLET(25 MG) BY MOUTH DAILY 90 tablet 2   Multiple Vitamin (MULTIVITAMIN) tablet Take 1 tablet by mouth daily.     nitroGLYCERIN (NITROSTAT) 0.4 MG SL tablet Place 1 tablet (0.4 mg total) under the tongue every 5 (five) minutes as needed for chest pain. 25 tablet 3   Probiotic Product (PROBIOTIC ADVANCED PO) Take 1 capsule by mouth daily.     simvastatin (ZOCOR) 40 MG tablet TAKE 1 TABLET(40 MG) BY MOUTH DAILY AT 6 PM 90 tablet 2   No current facility-administered medications on file prior to visit.   Past Medical History:  Diagnosis Date   Allergy    Arthritis    BPH (benign prostatic hyperplasia)    CAD (coronary artery disease)    Cataract    Diverticulosis of colon    Hx of adenomatous colonic polyps 05/22/2014   Hyperlipidemia    Hypertension    MYOCARDIAL INFARCTION, HX OF 07/20/2006   Qualifier: Diagnosis of  By: Leanne Chang MD, Bruce      Past Surgical History:   Procedure Laterality Date   CATARACT EXTRACTION     Bil   COLONOSCOPY     EYE SURGERY     LUMBAR LAMINECTOMY/DECOMPRESSION MICRODISCECTOMY Left 01/20/2018   Procedure: Left Lumbar five Sacral one extraforaminal decompression;  Surgeon: Eustace Moore, MD;  Location: [place];  Service: Neurosurgery;  Laterality: Left;   PTCA      Allergies  Allergen Reactions   Penicillins Rash and Other (See Comments)    PATIENT HAS HAD A PCN REACTION WITH IMMEDIATE RASH, FACIAL/TONGUE/THROAT SWELLING, SOB, OR LIGHTHEADEDNESS WITH HYPOTENSION:  #  #  YES  #  #  Has patient had a PCN reaction causing severe rash involving mucus membranes or skin necrosis: No Has patient had a PCN reaction that required hospitalization: No Has patient had a PCN reaction occurring within the last 10 years: No If all of the above answers are "NO", then may proceed with Cephalosporin use.    Family History  Problem Relation Age of Onset   Heart disease Mother        CABG  Stroke Father    Kidney disease Father        renal failure   AAA (abdominal aortic aneurysm) Father    Hypertension Sister    Diabetes Brother    AAA (abdominal aortic aneurysm) Paternal Uncle    Colon cancer Neg Hx     Social History   Socioeconomic History   Marital status: Married    Spouse name: Not on file   Number of children: 3   Years of education: 16   Highest education level: Not on file  Occupational History   Occupation: retired  Tobacco Use   Smoking status: Never   Smokeless tobacco: Never  Vaping Use   Vaping Use: Never used  Substance and Sexual Activity   Alcohol use: Yes    Alcohol/week: 1.0 standard drink    Types: 1 Glasses of wine per week    Comment: Occasional beer.   Drug use: No   Sexual activity: Not on file  Other Topics Concern   Not on file  Social History Narrative   Lives with wife in a 2 story home.  Has 3 children.     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    Education:  college.    Occasional beer, no smoking, no drugs   Social Determinants of Radio broadcast assistant Strain: Low Risk    Difficulty of Paying Living Expenses: Not hard at all  Food Insecurity: No Food Insecurity   Worried About Charity fundraiser in the Last Year: Never true   Ran Out of Food in the Last Year: Never true  Transportation Needs: No Transportation Needs   Lack of Transportation (Medical): No   Lack of Transportation (Non-Medical): No  Physical Activity: Sufficiently Active   Days of Exercise per Week: 6 days   Minutes of Exercise per Session: 30 min  Stress: No Stress Concern Present   Feeling of Stress : Not at all  Social Connections: Moderately Integrated   Frequency of Communication with Friends and Family: More than three times a week   Frequency of Social Gatherings with Friends and Family: Three times a week   Attends Religious Services: More than 4 times per year   Active Member of Clubs or Organizations: No   Attends Archivist Meetings: Never   Marital Status: Married   Vitals:   12/10/20 1400  BP: 122/78  Pulse: 60  Resp: 16  Temp: 98.2 F (36.8 C)  SpO2: 97%   Body mass index is 25.52 kg/m.  Wt Readings from Last 3 Encounters:  12/10/20 193 lb 6.4 oz (87.7 kg)  07/23/20 194 lb (88 kg)  01/15/20 196 lb 3.2 oz (89 kg)   Physical Exam Vitals and nursing note reviewed.  Constitutional:      General: Collin [name] is not in acute distress.    Appearance: Collin [name] is well-developed.  HENT:     Head: Normocephalic and atraumatic.     Right Ear: Tympanic membrane, ear canal and external ear normal.     Left Ear: Tympanic membrane, ear canal and external ear normal.  Eyes:     Conjunctiva/sclera: Conjunctivae normal.     Pupils: Pupils are equal, round, and reactive to light.  Neck:     Trachea: No tracheal deviation.  Cardiovascular:     Rate and Rhythm: Normal rate and regular rhythm.     Pulses:          Dorsalis pedis pulses are 2+ on the  right side and 2+ on the left side.     Heart sounds: No murmur heard. Pulmonary:     Effort: Pulmonary effort is normal. No respiratory distress.     Breath sounds: Normal breath sounds.  Abdominal:     Palpations: Abdomen is soft. There is no hepatomegaly or mass.     Tenderness: There is no abdominal tenderness.  Musculoskeletal:        General: No tenderness.     Cervical back: Normal range of motion.     Comments: No signs of synovitis.  Lymphadenopathy:     Cervical: No cervical adenopathy.  Skin:    General: Skin is warm.     Findings: No erythema.  Neurological:     Mental Status: Collin [name] is alert and oriented to person, place, and time.     Cranial Nerves: No cranial nerve deficit.     Motor: Tremor (Hands, not present at rest.) present.     Coordination: Romberg sign negative.     Gait: Gait abnormal.     Deep Tendon Reflexes:     Reflex Scores:      Bicep reflexes are 2+ on the right side and 2+ on the left side.      Patellar reflexes are 2+ on the right side and 2+ on the left side.    Comments: Mildly unstable gait, not assisted.   ASSESSMENT AND PLAN:  Collin [name] was seen today for annual exam and follow-up.  Diagnoses and all orders for this visit:  Routine general medical examination at a health care facility We discussed the importance of regular physical activity and healthy diet for prevention of chronic illness and/or complications. Preventive guidelines reviewed. Vaccination up to date.  Next CPE in a year.  Prediabetes Continue a healthy life style for diabetes prevention.  Dyslipidemia LDL at goal. Continue Simvastatin 40 mg daily and low fat diet.  BPH associated with nocturia Collin [name] is not sure if medications are helping but Collin [name] would like to continue current treatment, so no changes in Flomax or Proscar dose. We discussed some side effects, if medications are not helping we need to consider discontinuing them. Collin [name] is not interested in continue  following with urologist.  -     tamsulosin (FLOMAX) 0.4 MG CAPS capsule; Take 1 capsule (0.4 mg total) by mouth daily. -     finasteride (PROSCAR) 5 MG tablet; Take 1 tablet (5 mg total) by mouth daily.  Coronary artery disease involving native coronary artery of native heart with angina pectoris ([place]) On Metoprolol,Aspirin,and Simvastatin. Following with cardiologist.  Return in 6 months (on 06/09/2021).   Kemani Heidel G. Martinique, MD  [facility]. [place] office.

## 2020-12-10 ENCOUNTER — Encounter: Payer: Self-pay | Admitting: Family Medicine

## 2020-12-10 ENCOUNTER — Ambulatory Visit (INDEPENDENT_AMBULATORY_CARE_PROVIDER_SITE_OTHER): Payer: PPO | Admitting: Family Medicine

## 2020-12-10 VITALS — BP 122/78 | HR 60 | Temp 98.2°F | Resp 16 | Ht 73.0 in | Wt 193.4 lb

## 2020-12-10 DIAGNOSIS — I25119 Atherosclerotic heart disease of native coronary artery with unspecified angina pectoris: Secondary | ICD-10-CM

## 2020-12-10 DIAGNOSIS — R351 Nocturia: Secondary | ICD-10-CM | POA: Diagnosis not present

## 2020-12-10 DIAGNOSIS — R7303 Prediabetes: Secondary | ICD-10-CM | POA: Diagnosis not present

## 2020-12-10 DIAGNOSIS — E785 Hyperlipidemia, unspecified: Secondary | ICD-10-CM | POA: Diagnosis not present

## 2020-12-10 DIAGNOSIS — Z0000 Encounter for general adult medical examination without abnormal findings: Secondary | ICD-10-CM

## 2020-12-10 DIAGNOSIS — N401 Enlarged prostate with lower urinary tract symptoms: Secondary | ICD-10-CM

## 2020-12-10 MED ORDER — FINASTERIDE 5 MG PO TABS: 5.0000 mg | ORAL_TABLET | Freq: Every day | ORAL | 2 refills | Status: DC

## 2020-12-10 MED ORDER — TAMSULOSIN HCL 0.4 MG PO CAPS: 0.4000 mg | ORAL_CAPSULE | Freq: Every day | ORAL | 2 refills | Status: DC

## 2020-12-10 NOTE — Patient Instructions (Addendum)
A few things to remember from today's visit:  Routine general medical examination at a health care facility  Prediabetes  Dyslipidemia  BPH associated with nocturia - Plan: tamsulosin (FLOMAX) 0.4 MG CAPS capsule  Coronary artery disease involving native coronary artery of native heart with angina pectoris [facility]), Chronic  If you need refills please call your pharmacy. Do not use My Chart to request refills or for acute issues that need immediate attention.    Please be sure medication list is accurate. If a new problem present, please set up appointment sooner than planned today.  No changes today. Fall prevention.  Health Maintenance, Male Adopting a healthy lifestyle and getting preventive care are important in promoting health and wellness. Ask your health care provider about: The right schedule for you to have regular tests and exams. Things you can do on your own to prevent diseases and keep yourself healthy. What should I know about diet, weight, and exercise? Eat a healthy diet  Eat a diet that includes plenty of vegetables, fruits, low-fat dairy products, and lean protein. Do not eat a lot of foods that are high in solid fats, added sugars, or sodium. Maintain a healthy weight Body mass index (BMI) is a measurement that can be used to identify possible weight problems. It estimates body fat based on height and weight. Your health care provider can help determine your BMI and help you achieve or maintain a healthy weight. Get regular exercise Get regular exercise. This is one of the most important things you can do for your health. Most adults should: Exercise for at least 150 minutes each week. The exercise should increase your heart rate and make you sweat (moderate-intensity exercise). Do strengthening exercises at least twice a week. This is in addition to the moderate-intensity exercise. Spend less time sitting. Even light physical activity can be beneficial. Watch  cholesterol and blood lipids Have your blood tested for lipids and cholesterol at 82 years of age, then have this test every 5 years. You may need to have your cholesterol levels checked more often if: Your lipid or cholesterol levels are high. You are older than 82 years of age. You are at high risk for heart disease. What should I know about cancer screening? Many types of cancers can be detected early and may often be prevented. Depending on your health history and family history, you may need to have cancer screening at various ages. This may include screening for: Colorectal cancer. Prostate cancer. Skin cancer. Lung cancer. What should I know about heart disease, diabetes, and high blood pressure? Blood pressure and heart disease High blood pressure causes heart disease and increases the risk of stroke. This is more likely to develop in people who have high blood pressure readings or are overweight. Talk with your health care provider about your target blood pressure readings. Have your blood pressure checked: Every 3-5 years if you are 72-20 years of age. Every year if you are 27 years old or older. If you are between the ages of 99 and 5 and are a current or former smoker, ask your health care provider if you should have a one-time screening for abdominal aortic aneurysm (AAA). Diabetes Have regular diabetes screenings. This checks your fasting blood sugar level. Have the screening done: Once every three years after age 63 if you are at a normal weight and have a low risk for diabetes. More often and at a younger age if you are overweight or have a high  risk for diabetes. What should I know about preventing infection? Hepatitis B If you have a higher risk for hepatitis B, you should be screened for this virus. Talk with your health care provider to find out if you are at risk for hepatitis B infection. Hepatitis C Blood testing is recommended for: Everyone born from [birth year] through  1965. Anyone with known risk factors for hepatitis C. Sexually transmitted infections (STIs) You should be screened each year for STIs, including gonorrhea and chlamydia, if: You are sexually active and are younger than 82 years of age. You are older than 82 years of age and your health care provider tells you that you are at risk for this type of infection. Your sexual activity has changed since you were last screened, and you are at increased risk for chlamydia or gonorrhea. Ask your health care provider if you are at risk. Ask your health care provider about whether you are at high risk for HIV. Your health care provider may recommend a prescription medicine to help prevent HIV infection. If you choose to take medicine to prevent HIV, you should first get tested for HIV. You should then be tested every 3 months for as long as you are taking the medicine. Follow these instructions at home: Alcohol use Do not drink alcohol if your health care provider tells you not to drink. If you drink alcohol: Limit how much you have to 0-2 drinks a day. Know how much alcohol is in your drink. In the [place] one drink equals one 12 oz bottle of beer (355 mL), one 5 oz glass of wine (148 mL), or one 1 oz glass of hard liquor (44 mL). Lifestyle Do not use any products that contain nicotine or tobacco. These products include cigarettes, chewing tobacco, and vaping devices, such as e-cigarettes. If you need help quitting, ask your health care provider. Do not use street drugs. Do not share needles. Ask your health care provider for help if you need support or information about quitting drugs. General instructions Schedule regular health, dental, and eye exams. Stay current with your vaccines. Tell your health care provider if: You often feel depressed. You have ever been abused or do not feel safe at home. Summary Adopting a healthy lifestyle and getting preventive care are important in promoting health and  wellness. Follow your health care provider's instructions about healthy diet, exercising, and getting tested or screened for diseases. Follow your health care provider's instructions on monitoring your cholesterol and blood pressure. This information is not intended to replace advice given to you by your health care provider. Make sure you discuss any questions you have with your health care provider. Document Revised: 06/02/2020 Document Reviewed: 06/02/2020 Elsevier Patient Education  [place].

## 2020-12-13 ENCOUNTER — Encounter: Payer: Self-pay | Admitting: Family Medicine

## 2021-01-05 ENCOUNTER — Ambulatory Visit: Payer: PPO

## 2021-01-05 ENCOUNTER — Telehealth: Payer: Self-pay | Admitting: Family Medicine

## 2021-01-05 NOTE — Telephone Encounter (Signed)
Left message for patient to call back and schedule Medicare Annual Wellness Visit (AWV) either virtually or in office. Left  my Herbie Drape number [phone]   Last AWV 01/03/20  please schedule at anytime with [facility] Nurse Health Advisor 1 or 2   This should be a 45 minute visit.

## 2021-01-09 ENCOUNTER — Telehealth: Payer: Self-pay | Admitting: Pharmacist

## 2021-01-09 NOTE — Chronic Care Management (AMB) (Signed)
° ° °  Chronic Care Management Pharmacy Assistant   Name: Collin [name]  MRN: [id number] DOB: [birth year]  01/09/21 APPOINTMENT REMINDER   Called Patient No answer, left message of appointment on 01/13/21 at 2 via telephone visit with Jeni Salles, Pharm D.   Notified to have all medications, supplements, blood pressure and/or blood sugar logs available during appointment and to return call if need to reschedule.    Care Gaps: Colonoscopy - Overdue COVID Booster #4 [employer]) - Overdue  CCM F/U - Scheduled for 01-13-21 AWV -  2/23  Star Rating Drug: Simvastatin 40mg  - Last filled 11/25/2020 90 DS [facility]  Any gaps in medications fill history? None   Medications: Outpatient Encounter Medications as of 01/09/2021  Medication Sig   aspirin 81 MG tablet Take 1 tablet (81 mg total) by mouth at bedtime.   Cholecalciferol (VITAMIN D3) 125 MCG (5000 UT) CAPS Take 5,000 Units by mouth at bedtime.   Coenzyme Q10 300 MG CAPS Take 300 mg by mouth daily.   Cyanocobalamin (VITAMIN B-12 PO) Take 2,500 mcg by mouth daily.   finasteride (PROSCAR) 5 MG tablet Take 1 tablet (5 mg total) by mouth daily.   fish oil-omega-3 fatty acids 1000 MG capsule Take 1 g by mouth daily.   gabapentin (NEURONTIN) 100 MG capsule Take 1 capsule (100 mg total) by mouth 2 (two) times daily.   Glucosamine-Chondroitin (MOVE FREE PO) Take 1 tablet by mouth daily.   metoprolol succinate (TOPROL-XL) 25 MG 24 hr tablet TAKE 1 TABLET(25 MG) BY MOUTH DAILY   Multiple Vitamin (MULTIVITAMIN) tablet Take 1 tablet by mouth daily.   nitroGLYCERIN (NITROSTAT) 0.4 MG SL tablet Place 1 tablet (0.4 mg total) under the tongue every 5 (five) minutes as needed for chest pain.   Probiotic Product (PROBIOTIC ADVANCED PO) Take 1 capsule by mouth daily.   simvastatin (ZOCOR) 40 MG tablet TAKE 1 TABLET(40 MG) BY MOUTH DAILY AT 6 PM   tamsulosin (FLOMAX) 0.4 MG CAPS capsule Take 1 capsule (0.4 mg total) by mouth daily.   No  facility-administered encounter medications on file as of 01/09/2021.     [place] Clinical Pharmacist Assistant [phone]

## 2021-01-13 ENCOUNTER — Ambulatory Visit (INDEPENDENT_AMBULATORY_CARE_PROVIDER_SITE_OTHER): Payer: PPO | Admitting: Pharmacist

## 2021-01-13 DIAGNOSIS — R7303 Prediabetes: Secondary | ICD-10-CM

## 2021-01-13 DIAGNOSIS — N401 Enlarged prostate with lower urinary tract symptoms: Secondary | ICD-10-CM

## 2021-01-13 NOTE — Progress Notes (Signed)
Chronic Care Management Pharmacy Note  01/13/2021 Name:  Collin [name] MRN:  [id number] DOB:  Dec 27, 1940  Summary: LDL at goal < 70 Pt is concerned with pre-diabetes and A1c increase  Recommendations/Changes made from today's visit: -Counseled on dietary/lifestyle modifications for A1c lowering -Consider prescribing overactive bladder medication as patient still reports symptoms  Plan: Follow up after PCP visit in 6 months   Subjective: Collin [name] Been is an 80 y.o. year old male who is a primary patient of Martinique, Malka So, MD.  The CCM team was consulted for assistance with disease management and care coordination needs.    Engaged with patient by telephone for follow up visit in response to provider referral for pharmacy case management and/or care coordination services.   Consent to Services:  The patient was given information about Chronic Care Management services, agreed to services, and gave verbal consent prior to initiation of services.  Please see initial visit note for detailed documentation.   Patient Care Team: Martinique, Betty G, MD as PCP - General (Family Medicine) Sherren Mocha, MD as PCP - Cardiology (Cardiology) Viona Gilmore, [facility] as Pharmacist (Pharmacist)  Recent office visits: 12/10/20 Betty Martinique, MD: Patient presented for annual exam.   07/23/20 Betty Martinique, MD: Patient presented for foot drop. Prescribed gabapentin and decreased dose to 100 mg daily.  Recent consult visits: 08/15/20 Camillo Flaming (dermatology): Unable to access notes.  04/22/20 Link Snuffer, MD (urology): Unable to access notes.  04/15/20 [name], MD (urology): Unable to access notes.  Hospital visits: None in previous 6 months   Objective:  Lab Results  Component Value Date   CREATININE 1.16 12/03/2020   BUN 21 12/03/2020   GFR 58.69 (L) 12/03/2020   GFRNONAA 59 (L) 01/16/2020   GFRAA 68 01/16/2020   NA 138 12/03/2020   K 4.4 12/03/2020   CALCIUM  9.3 12/03/2020   CO2 29 12/03/2020   GLUCOSE 106 (H) 12/03/2020    Lab Results  Component Value Date/Time   HGBA1C 6.3 12/03/2020 08:37 AM   HGBA1C 5.9 (H) 11/05/2019 08:28 AM   GFR 58.69 (L) 12/03/2020 08:37 AM   GFR 61.05 06/04/2019 10:09 AM    Last diabetic Eye exam: No results found for: HMDIABEYEEXA  Last diabetic Foot exam: No results found for: HMDIABFOOTEX   Lab Results  Component Value Date   CHOL 122 12/03/2020   HDL 61.10 12/03/2020   LDLCALC 52 12/03/2020   TRIG 47.0 12/03/2020   CHOLHDL 2 12/03/2020    Hepatic Function Latest Ref Rng & Units 12/03/2020 11/05/2019 06/04/2019  Total Protein 6.0 - 8.3 g/dL 6.8 7.0 7.2  Albumin 3.5 - 5.2 g/dL 4.1 - 4.4  AST 0 - 37 U/L 31 32 38(H)  ALT 0 - 53 U/L _0 Alk Phosphatase 39 - 117 U/L 39 - 50  Total Bilirubin 0.2 - 1.2 mg/dL 0.9 0.9 0.7  Bilirubin, Direct 0.0 - 0.2 mg/dL - 0.2 -    Lab Results  Component Value Date/Time   TSH 1.98 06/04/2019 10:09 AM   TSH 1.26 10/19/2016 02:39 PM    CBC Latest Ref Rng & Units 01/01/2020 06/04/2019 01/09/2018  WBC 3.4 - 10.8 x10E3/uL 8.6 6.6 7.9  Hemoglobin 13.0 - 17.7 g/dL 15.1 15.3 15.4  Hematocrit 37.5 - 51.0 % 44.7 45.9 48.1  Platelets 150 - 450 x10E3/uL 200 201.0 197    Lab Results  Component Value Date/Time   VD25OH 92.88 06/04/2019 10:09 AM   VD25OH 60 07/24/2008  09:28 PM    Clinical ASCVD: Yes  The ASCVD Risk score (Arnett DK, et al., 2019) failed to calculate for the following reasons:   The 2019 ASCVD risk score is only valid for ages 13 to 60    Depression screen PHQ 2/9 12/10/2020 01/03/2020 11/11/2019  Decreased Interest 0 0 0  Down, Depressed, Hopeless 0 0 0  PHQ - 2 Score 0 0 0  Altered sleeping - 0 -  Tired, decreased energy - 0 -  Change in appetite - 0 -  Feeling bad or failure about yourself  - 0 -  Trouble concentrating - 0 -  Moving slowly or fidgety/restless - 0 -  Suicidal thoughts - 0 -  PHQ-9 Score - 0 -  Difficult doing work/chores -  Not difficult at all -      Social History   Tobacco Use  Smoking Status Never  Smokeless Tobacco Never   BP Readings from Last 3 Encounters:  12/10/20 122/78  07/23/20 124/70  01/15/20 128/70   Pulse Readings from Last 3 Encounters:  12/10/20 60  07/23/20 67  01/15/20 70   Wt Readings from Last 3 Encounters:  12/10/20 193 lb 6.4 oz (87.7 kg)  07/23/20 194 lb (88 kg)  01/15/20 196 lb 3.2 oz (89 kg)   BMI Readings from Last 3 Encounters:  12/10/20 25.52 kg/m  07/23/20 25.60 kg/m  01/15/20 25.89 kg/m    Assessment/Interventions: Review of patient past medical history, allergies, medications, health status, including review of consultants reports, laboratory and other test data, was performed as part of comprehensive evaluation and provision of chronic care management services.   SDOH:  (Social Determinants of Health) assessments and interventions performed: No  SDOH Screenings   Alcohol Screen: Not on file  Depression (PHQ2-9): Low Risk    PHQ-2 Score: 0  Financial Resource Strain: Low Risk    Difficulty of Paying Living Expenses: Not hard at all  Food Insecurity: Not on file  Housing: Not on file  Physical Activity: Not on file  Social Connections: Not on file  Stress: Not on file  Tobacco Use: Low Risk    Smoking Tobacco Use: Never   Smokeless Tobacco Use: Never   Passive Exposure: Not on file  Transportation Needs: No Transportation Needs   Lack of Transportation (Medical): No   Lack of Transportation (Non-Medical): No   Patient didn't find any relief from the accupuncture. He quit going and didn't have a lot of confidence in them. They told him they thought it was a bladder issue vs prostate issue. He thought PT was going to be for the bladder as well.  CCM Care Plan  Allergies  Allergen Reactions   Penicillins Rash and Other (See Comments)    PATIENT HAS HAD A PCN REACTION WITH IMMEDIATE RASH, FACIAL/TONGUE/THROAT SWELLING, SOB, OR LIGHTHEADEDNESS  WITH HYPOTENSION:  #  #  YES  #  #  Has patient had a PCN reaction causing severe rash involving mucus membranes or skin necrosis: No Has patient had a PCN reaction that required hospitalization: No Has patient had a PCN reaction occurring within the last 10 years: No If all of the above answers are "NO", then may proceed with Cephalosporin use.    Medications Reviewed Today     Reviewed by Viona Gilmore, [facility] (Pharmacist) on 01/13/21 at 1413  Med List Status: <None>   Medication Order Taking? Sig Documenting Provider Last Dose Status Informant  aspirin 81 MG tablet [id number] No Take 1  tablet (81 mg total) by mouth at bedtime. Costella, Vista Mink, PA-C Taking Active   Cholecalciferol (VITAMIN D3) 125 MCG (5000 UT) CAPS [id number] No Take 5,000 Units by mouth at bedtime. [provider] Taking Active Self  Coenzyme Q10 300 MG CAPS [id number] No Take 300 mg by mouth daily. [provider] Taking Active Self  Cyanocobalamin (VITAMIN B-12 PO) [id number] No Take 2,500 mcg by mouth daily. [provider] Taking Active Self  finasteride (PROSCAR) 5 MG tablet [id number]  Take 1 tablet (5 mg total) by mouth daily. Martinique, Betty G, MD  Active   fish oil-omega-3 fatty acids 1000 MG capsule [id number] No Take 1 g by mouth daily. [provider] Taking Active Self  gabapentin (NEURONTIN) 100 MG capsule [id number] No Take 1 capsule (100 mg total) by mouth 2 (two) times daily. Martinique, Betty G, MD Taking Active   Glucosamine-Chondroitin (MOVE FREE PO) [id number] No Take 1 tablet by mouth daily. [provider] Taking Active Self  metoprolol succinate (TOPROL-XL) 25 MG 24 hr tablet [id number] No TAKE 1 TABLET(25 MG) BY MOUTH DAILY Sherren Mocha, MD Taking Active   Multiple Vitamin (MULTIVITAMIN) tablet [id number] No Take 1 tablet by mouth daily. [provider] Taking Active Self  nitroGLYCERIN (NITROSTAT) 0.4 MG SL tablet [id number] No Place 1 tablet (0.4 mg total)  under the tongue every 5 (five) minutes as needed for chest pain. Sherren Mocha, MD Taking Active            Med Note Juventino Slovak   Tue Jan 01, 2020  2:18 PM)    Probiotic Product (PROBIOTIC ADVANCED PO) [id number] No Take 1 capsule by mouth daily. [provider] Taking Active   simvastatin (ZOCOR) 40 MG tablet [id number] No TAKE 1 TABLET(40 MG) BY MOUTH DAILY AT 6 PM Sherren Mocha, MD Taking Active   tamsulosin [facility]) 0.4 MG CAPS capsule [id number]  Take 1 capsule (0.4 mg total) by mouth daily. Martinique, Betty G, MD  Active             Patient Active Problem List   Diagnosis Date Noted   S/P lumbar laminectomy 01/20/2018   Essential tremor 11/02/2017   Unstable gait 11/02/2017   Lumbar radiculopathy 07/19/2017   Peripheral neuropathy 12/06/2016   Hx of adenomatous colonic polyps 05/22/2014   Left inguinal hernia 09/10/2011   PSA, INCREASED 08/05/2009   CONGENITAL PIGMENTARY ANOMALY OF SKIN 07/25/2007   Dyslipidemia 07/20/2006   Coronary atherosclerosis 07/20/2006   BPH associated with nocturia 07/20/2006    Immunization History  Administered Date(s) Administered   Fluad Quad(high Dose 65+) 11/05/2019   Influenza Whole 12/04/2008, 01/20/2010, 07/26/2010   Influenza, High Dose Seasonal PF 10/16/2015, 11/17/2016, 11/02/2017   Influenza,inj,Quad PF,6+ Mos 11/20/2012, 11/20/2013, 10/08/2014   Influenza-Unspecified 11/17/2016, 10/17/2020   PFIZER Comirnaty(Gray Top)Covid-19 Tri-Sucrose Vaccine 10/17/2020   PFIZER(Purple Top)SARS-COV-2 Vaccination 03/02/2019, 03/27/2019, 10/30/2019   Pneumococcal Conjugate-13 11/20/2013   Pneumococcal Polysaccharide-23 07/20/2006   Td 07/20/2006   Zoster Recombinat (Shingrix) 03/27/2018, 07/14/2018   Zoster, Live 09/09/2010   Patient is having balance problems and doesn't think it's getting any better. He did physical therapy but didn't think it helps any. He has been doing the exercises consistently. His back was hurting with yoga  and doesn't want to do this because of that.   He doesn't have a cane but he doesn't think he's bad enough to use that yet.  He is going to the bathroom often. He is trying to drink a lot of  water and he pees a lot. He usually goes about twice during the night. He did do 8 sessions of acupuncture for bladder. He didn't do physical therapy with them.   He stopped drinking sweet tea and quit eating ice cream. He has pre-diabetes and is being more conscious. He is still staying active everyday except Saturday, when he is walking and takes off on Sunday.  Conditions to be addressed/monitored:  Hypertension, Hyperlipidemia, Coronary Artery Disease, BPH, and Prediabetes  Conditions addressed this visit: Prediabetes, BPH, hyperlipidemia  Care Plan : CCM Pharmacy Care Plan  Updates made by Viona Gilmore, [place] since 01/13/2021 12:00 AM     Problem: Problem: Hyperlipidemia, Coronary Artery Disease, BPH, and Prediabetes      Long-Range Goal: Patient-Specific Goal   Start Date: 07/15/2020  Expected End Date: 07/15/2021  Recent Progress: On track  Priority: High  Note:   Current Barriers:  Unable to independently monitor therapeutic efficacy  Pharmacist Clinical Goal(s):  Patient will achieve adherence to monitoring guidelines and medication adherence to achieve therapeutic efficacy through collaboration with PharmD and provider.   Interventions: 1:1 collaboration with Martinique, Betty G, MD regarding development and update of comprehensive plan of care as evidenced by provider attestation and co-signature Inter-disciplinary care team collaboration (see longitudinal plan of care) Comprehensive medication review performed; medication list updated in electronic medical record  Hyperlipidemia: (LDL goal < 70) -Controlled -Current treatment: Simvastatin 40 mg 1 tablet daily at 6pm Omega 3 fatty acids 1400 mg 1 capsule daily -Medications previously tried: none  -Current dietary patterns: did  not discuss -Current exercise habits: did not discuss -Educated on Cholesterol goals;  Benefits of statin for ASCVD risk reduction; Exercise goal of 150 minutes per week; -Counseled on diet and exercise extensively Recommended to continue current medication  CAD (Goal: prevent heart events) -Controlled -Current treatment  Aspirin 81 mg 1 tablet daily Nitroglycerin 0.4 mg SL tablet as needed Metoprolol succinate 25 mg 1 tablet daily -Medications previously tried: n/a  -Counseled on diet and exercise extensively Recommended to continue current medication  Pre-diabetes (A1c goal <6.5%) -Controlled -Current medications: No medications -Medications previously tried: none  -Current home glucose readings fasting glucose: does not need to check post prandial glucose: does not need to check -Denies hypoglycemic/hyperglycemic symptoms -Current meal patterns:  breakfast: did not discuss  lunch: did not discuss   dinner: did not discuss  snacks: eating less ice cream drinks: cut out sweet tea  -Current exercise: exercising 6 days a week -Educated on A1c and blood sugar goals; Carbohydrate counting and/or plate method -Counseled to check feet daily and get yearly eye exams -Counseled on diet and exercise extensively  BPH (Goal: minimize symptoms) -Not ideally controlled -Current treatment  Finasteride 5 mg daily - going to try without Tamsulosin 0.4 mg 1 capsule daily -Medications previously tried: saw palmetto (ineffective) -Recommended to continue current medication Patient is still having symptoms and unsure if it's a bladder vs prostate problem.  Imbalance (Goal: minimize imbalance) -Not ideally controlled -Current treatment  Gabapentin 2 capsules at bedtime  -Medications previously tried: n/a  -Recommended to continue current medication   Health Maintenance -Vaccine gaps: tetanus, COVID booster -Current therapy:  Grape seed 1 capsule daily Vitamin D3 1000 units 1  capsule at bedtime Coenzyme Q10 300 mg 1 capsule daily Glucosamine-chondroitin 1 tablet daily - not sure if its working Multivitamin 1 tablet daily Vitamin B-12 2500 mcg 1 tablet daily - vitamin 1000 mcg daily Probiotic once daily -Educated on Cost vs  benefit of each product must be carefully weighed by individual consumer -Patient is satisfied with current therapy and denies issues -Recommended to continue current medication  Patient Goals/Self-Care Activities Patient will:  - take medications as prescribed target a minimum of 150 minutes of moderate intensity exercise weekly  Follow Up Plan: Telephone follow up appointment with care management team member scheduled for: 6 months       Medication Assistance: None required.  Patient affirms current coverage meets needs.  Compliance/Adherence/Medication fill history: Care Gaps: Covid booster, colonoscopy  Star-Rating Drugs: Simvastatin 18m - [facility]  [address]Phone: [phone]: [phone]  Uses pill box? Yes Pt endorses 99% compliance  We discussed: Current pharmacy is preferred with insurance plan and patient is satisfied with pharmacy services Patient decided to: Continue current medication management strategy  Care Plan and Follow Up Patient Decision:  Patient agrees to Care Plan and Follow-up.  Plan: Telephone follow up appointment with care management team member scheduled for:  6 months  MJeni Salles PharmD, BCaseyvillePharmacist LBurbankat [phone]

## 2021-01-13 NOTE — Patient Instructions (Addendum)
Hi Collin [name],  It was great to catch up with you again! I attached some Kegel exercises that may or may not be useful for strengthening your bladder muscle.  Please reach out to me if you have any questions or need anything before our follow up!  Best, Maddie  Jeni Salles, PharmD, [place] [facility] [place]   Visit Information   Goals Addressed   None    Patient Care Plan: CCM Pharmacy Care Plan     Problem Identified: Problem: Hyperlipidemia, Coronary Artery Disease, BPH, and Prediabetes      Long-Range Goal: Patient-Specific Goal   Start Date: 07/15/2020  Expected End Date: 07/15/2021  Recent Progress: On track  Priority: High  Note:   Current Barriers:  Unable to independently monitor therapeutic efficacy  Pharmacist Clinical Goal(s):  Patient will achieve adherence to monitoring guidelines and medication adherence to achieve therapeutic efficacy through collaboration with PharmD and provider.   Interventions: 1:1 collaboration with Martinique, Betty G, MD regarding development and update of comprehensive plan of care as evidenced by provider attestation and co-signature Inter-disciplinary care team collaboration (see longitudinal plan of care) Comprehensive medication review performed; medication list updated in electronic medical record  Hyperlipidemia: (LDL goal < 70) -Controlled -Current treatment: Simvastatin 40 mg 1 tablet daily at 6pm Omega 3 fatty acids 1400 mg 1 capsule daily -Medications previously tried: none  -Current dietary patterns: did not discuss -Current exercise habits: did not discuss -Educated on Cholesterol goals;  Benefits of statin for ASCVD risk reduction; Exercise goal of 150 minutes per week; -Counseled on diet and exercise extensively Recommended to continue current medication  CAD (Goal: prevent heart events) -Controlled -Current treatment  Aspirin 81 mg 1 tablet daily Nitroglycerin  0.4 mg SL tablet as needed Metoprolol succinate 25 mg 1 tablet daily -Medications previously tried: n/a  -Counseled on diet and exercise extensively Recommended to continue current medication  Pre-diabetes (A1c goal <6.5%) -Controlled -Current medications: No medications -Medications previously tried: none  -Current home glucose readings fasting glucose: does not need to check post prandial glucose: does not need to check -Denies hypoglycemic/hyperglycemic symptoms -Current meal patterns:  breakfast: did not discuss  lunch: did not discuss   dinner: did not discuss  snacks: eating less ice cream drinks: cut out sweet tea  -Current exercise: exercising 6 days a week -Educated on A1c and blood sugar goals; Carbohydrate counting and/or plate method -Counseled to check feet daily and get yearly eye exams -Counseled on diet and exercise extensively  BPH (Goal: minimize symptoms) -Not ideally controlled -Current treatment  Finasteride 5 mg daily - going to try without Tamsulosin 0.4 mg 1 capsule daily -Medications previously tried: saw palmetto (ineffective) -Recommended to continue current medication Patient is still having symptoms and unsure if it's a bladder vs prostate problem.  Imbalance (Goal: minimize imbalance) -Not ideally controlled -Current treatment  Gabapentin 2 capsules at bedtime  -Medications previously tried: n/a  -Recommended to continue current medication   Health Maintenance -Vaccine gaps: tetanus, COVID booster -Current therapy:  Grape seed 1 capsule daily Vitamin D3 1000 units 1 capsule at bedtime Coenzyme Q10 300 mg 1 capsule daily Glucosamine-chondroitin 1 tablet daily - not sure if its working Multivitamin 1 tablet daily Vitamin B-12 2500 mcg 1 tablet daily - vitamin 1000 mcg daily Probiotic once daily -Educated on Cost vs benefit of each product must be carefully weighed by individual consumer -Patient is satisfied with current therapy and  denies issues -Recommended to continue current medication  Patient Goals/Self-Care Activities Patient will:  - take medications as prescribed target a minimum of 150 minutes of moderate intensity exercise weekly  Follow Up Plan: Telephone follow up appointment with care management team member scheduled for: 6 months       Patient verbalizes understanding of instructions provided today and agrees to view in [place].  Telephone follow up appointment with pharmacy team member scheduled for: 6 months  Viona Gilmore, [facility]

## 2021-01-23 ENCOUNTER — Other Ambulatory Visit: Payer: Self-pay | Admitting: Family Medicine

## 2021-01-23 DIAGNOSIS — M21372 Foot drop, left foot: Secondary | ICD-10-CM

## 2021-01-23 DIAGNOSIS — G609 Hereditary and idiopathic neuropathy, unspecified: Secondary | ICD-10-CM

## 2021-01-24 DIAGNOSIS — R351 Nocturia: Secondary | ICD-10-CM

## 2021-01-24 DIAGNOSIS — N401 Enlarged prostate with lower urinary tract symptoms: Secondary | ICD-10-CM

## 2021-02-09 ENCOUNTER — Other Ambulatory Visit: Payer: Self-pay | Admitting: Cardiovascular Disease

## 2021-02-18 ENCOUNTER — Encounter: Payer: Self-pay | Admitting: Cardiovascular Disease

## 2021-02-18 NOTE — Telephone Encounter (Signed)
Error

## 2021-03-01 ENCOUNTER — Other Ambulatory Visit: Payer: Self-pay | Admitting: Cardiovascular Disease

## 2021-03-03 ENCOUNTER — Ambulatory Visit (INDEPENDENT_AMBULATORY_CARE_PROVIDER_SITE_OTHER): Payer: PPO

## 2021-03-03 VITALS — Ht 73.0 in | Wt 192.0 lb

## 2021-03-03 DIAGNOSIS — Z0000 Encounter for general adult medical examination without abnormal findings: Secondary | ICD-10-CM | POA: Diagnosis not present

## 2021-03-03 NOTE — Progress Notes (Signed)
I connected with Kayla Pomeroy today by telephone and verified that I am speaking with the correct person using two identifiers. Location patient: home Location provider: work Persons participating in the virtual visit: [employer] LPN.   I discussed the limitations, risks, security and privacy concerns of performing an evaluation and management service by telephone and the availability of in person appointments. I also discussed with the patient that there may be a patient responsible charge related to this service. The patient expressed understanding and verbally consented to this telephonic visit.    Interactive audio and video telecommunications were attempted between this provider and patient, however failed, due to patient having technical difficulties OR patient did not have access to video capability.  We continued and completed visit with audio only.     Vital signs may be patient reported or missing.  Subjective:   Collin [name] is a 81 y.o. male who presents for Medicare Annual/Subsequent preventive examination.  Review of Systems     Cardiac Risk Factors include: advanced age (>56men, >84 women);dyslipidemia     Objective:    Today's Vitals   03/03/21 1551  Weight: 192 lb (87.1 kg)  Height: 6\' 1"  (1.854 m)   Body mass index is 25.33 kg/m.  Advanced Directives 03/03/2021 01/03/2020 06/14/2019 02/20/2018 01/20/2018 01/20/2018 01/09/2018  Does Patient Have a Medical Advance Directive? Yes Yes No No Yes Yes Yes  Type of Paramedic of [place];Living will [place];Living will - - [place];Living will [place];Living will -  Does patient want to make changes to medical advance directive? - No - Patient declined - - No - Patient declined - -  Copy of [place] in Chart? No - copy requested No - copy requested - - No - copy requested No - copy requested -   Would patient like information on creating a medical advance directive? - - No - Patient declined No - Patient declined - - -  Pre-existing out of facility DNR order (yellow form or pink MOST form) - - - - - - -    Current Medications (verified) Outpatient Encounter Medications as of 03/03/2021  Medication Sig   aspirin 81 MG tablet Take 1 tablet (81 mg total) by mouth at bedtime.   Cholecalciferol (VITAMIN D3) 125 MCG (5000 UT) CAPS Take 5,000 Units by mouth at bedtime.   Coenzyme Q10 300 MG CAPS Take 300 mg by mouth daily.   fish oil-omega-3 fatty acids 1000 MG capsule Take 1 g by mouth daily.   gabapentin (NEURONTIN) 100 MG capsule TAKE 1 CAPSULE(100 MG) BY MOUTH TWICE DAILY   metoprolol succinate (TOPROL-XL) 25 MG 24 hr tablet Take 1 tablet (25 mg total) by mouth daily. Please keep upcoming appointment in Feb. 2023 for future refills. Thank you   Multiple Vitamin (MULTIVITAMIN) tablet Take 1 tablet by mouth daily.   nitroGLYCERIN (NITROSTAT) 0.4 MG SL tablet Place 1 tablet (0.4 mg total) under the tongue every 5 (five) minutes as needed for chest pain.   Probiotic Product (PROBIOTIC ADVANCED PO) Take 1 capsule by mouth daily.   simvastatin (ZOCOR) 40 MG tablet Take 1 tablet (40 mg total) by mouth daily at 6 PM. Please make overdue appt with Dr. Burt Knack before anymore refills. Thank you 1st attempt   tamsulosin (FLOMAX) 0.4 MG CAPS capsule Take 1 capsule (0.4 mg total) by mouth daily.   Cyanocobalamin (VITAMIN B-12 PO) Take 2,500 mcg by  mouth daily. (Patient not taking: Reported on 03/03/2021)   finasteride (PROSCAR) 5 MG tablet Take 1 tablet (5 mg total) by mouth daily. (Patient not taking: Reported on 03/03/2021)   Glucosamine-Chondroitin (MOVE FREE PO) Take 1 tablet by mouth daily. (Patient not taking: Reported on 03/03/2021)   [DISCONTINUED] metoprolol succinate (TOPROL-XL) 25 MG 24 hr tablet TAKE 1 TABLET(25 MG) BY MOUTH DAILY   No facility-administered encounter medications on file as of  03/03/2021.    Allergies (verified) Penicillins   History: Past Medical History:  Diagnosis Date   Allergy    Arthritis    BPH (benign prostatic hyperplasia)    CAD (coronary artery disease)    Cataract    Diverticulosis of colon    Hx of adenomatous colonic polyps 05/22/2014   Hyperlipidemia    Hypertension    MYOCARDIAL INFARCTION, HX OF 07/20/2006   Qualifier: Diagnosis of  By: Leanne Chang MD, Bruce     Past Surgical History:  Procedure Laterality Date   CATARACT EXTRACTION     Bil   COLONOSCOPY     EYE SURGERY     LUMBAR LAMINECTOMY/DECOMPRESSION MICRODISCECTOMY Left 01/20/2018   Procedure: Left Lumbar five Sacral one extraforaminal decompression;  Surgeon: Eustace Moore, MD;  Location: [facility];  Service: Neurosurgery;  Laterality: Left;   PTCA     Family History  Problem Relation Age of Onset   Heart disease Mother        CABG   Stroke Father    Kidney disease Father        renal failure   AAA (abdominal aortic aneurysm) Father    Hypertension Sister    Diabetes Brother    AAA (abdominal aortic aneurysm) Paternal Uncle    Colon cancer Neg Hx    Social History   Socioeconomic History   Marital status: Married    Spouse name: Not on file   Number of children: 3   Years of education: 16   Highest education level: Not on file  Occupational History   Occupation: retired  Tobacco Use   Smoking status: Never   Smokeless tobacco: Never  Vaping Use   Vaping Use: Never used  Substance and Sexual Activity   Alcohol use: Not Currently    Comment: wine occasionally   Drug use: No   Sexual activity: Not on file  Other Topics Concern   Not on file  Social History Narrative   Lives with wife in a 2 story home.  Has 3 children.     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    Education: college.    Occasional beer, no smoking, no drugs   Social Determinants of Radio broadcast assistant Strain: Low Risk    Difficulty of Paying Living Expenses:  Not hard at all  Food Insecurity: No Food Insecurity   Worried About Charity fundraiser in the Last Year: Never true   Ran Out of Food in the Last Year: Never true  Transportation Needs: No Transportation Needs   Lack of Transportation (Medical): No   Lack of Transportation (Non-Medical): No  Physical Activity: Sufficiently Active   Days of Exercise per Week: 6 days   Minutes of Exercise per Session: 90 min  Stress: No Stress Concern Present   Feeling of Stress : Not at all  Social Connections: Not on file    Tobacco Counseling Counseling given: Not Answered   Clinical Intake:  Pre-visit preparation completed: Yes  Pain : No/denies pain  Nutritional Status: BMI 25 -29 Overweight Nutritional Risks: None Diabetes: No  How often do you need to have someone help you when you read instructions, pamphlets, or other written materials from your doctor or pharmacy?: 1 - Never  Diabetic? no  Interpreter Needed?: No  Information entered by :: NAllen LPN   Activities of Daily Living In your present state of health, do you have any difficulty performing the following activities: 03/03/2021  Hearing? Y  Comment has hearing aides  Vision? N  Difficulty concentrating or making decisions? Y  Walking or climbing stairs? N  Dressing or bathing? N  Doing errands, shopping? N  Preparing Food and eating ? N  Using the Toilet? N  In the past six months, have you accidently leaked urine? N  Do you have problems with loss of bowel control? N  Managing your Medications? N  Managing your Finances? N  Housekeeping or managing your Housekeeping? N  Some recent data might be hidden    Patient Care Team: [place], Betty G, MD as PCP - General (Family Medicine) Sherren Mocha, MD as PCP - Cardiology (Cardiology) Viona Gilmore, [facility] as Pharmacist (Pharmacist)  Indicate any recent Medical Services you may have received from other than [facility] providers in the past year (date may be  approximate).     Assessment:   This is a routine wellness examination for [place].  Hearing/Vision screen Vision Screening - Comments:: Regular eye exams, [facility]  Dietary issues and exercise activities discussed: Current Exercise Habits: Home exercise routine, Type of exercise: Other - see comments;strength training/weights (golf and cardio)   Goals Addressed             This Visit's Progress    Patient Stated       03/03/2021, wants to live be 88       Depression Screen PHQ 2/9 Scores 03/03/2021 12/10/2020 01/03/2020 11/11/2019 11/09/2018 11/02/2017 10/14/2015  PHQ - 2 Score 0 0 0 0 0 0 0  PHQ- 9 Score - - 0 - - - -    Fall Risk Fall Risk  03/03/2021 12/10/2020 01/03/2020 11/09/2018 11/02/2017  Falls in the past year? 0 0 1 0 No  Number falls in past yr: - 0 0 0 -  Injury with Fall? - 0 0 0 -  Risk for fall due to : Impaired balance/gait;Impaired mobility;Medication side effect Impaired balance/gait Impaired balance/gait;History of fall(s) Impaired balance/gait;Orthopedic patient -  Follow up Falls evaluation completed;Education provided;Falls prevention discussed Education provided Falls evaluation completed;Falls prevention discussed Education provided -    FALL RISK PREVENTION PERTAINING TO THE HOME:  Any stairs in or around the home? Yes  If so, are there any without handrails? No  Home free of loose throw rugs in walkways, pet beds, electrical cords, etc? Yes  Adequate lighting in your home to reduce risk of falls? Yes   ASSISTIVE DEVICES UTILIZED TO PREVENT FALLS:  Life alert? No  Use of a cane, walker or w/c? No  Grab bars in the bathroom? Yes  Shower chair or bench in shower? Yes  Elevated toilet seat or a handicapped toilet? No   TIMED UP AND GO:  Was the test performed? No .      Cognitive Function:   Montreal Cognitive Assessment  01/26/2017  Visuospatial/ Executive (0/5) 4  Naming (0/3) 2  Attention: Read list of digits (0/2) 2  Attention:  Read list of letters (0/1) 1  Attention: Serial 7 subtraction starting at 100 (0/3) 3  Language: Repeat phrase (0/2) 2  Language : Fluency (0/1) 1  Abstraction (0/2) 2  Delayed Recall (0/5) 3  Orientation (0/6) 5  Total 25  Adjusted Score (based on education) 25   6CIT Screen 03/03/2021 01/03/2020  What Year? 0 points 0 points  What month? 0 points 0 points  What time? 0 points 0 points  Count back from 20 0 points 0 points  Months in reverse 0 points 0 points  Repeat phrase 4 points 2 points  Total Score 4 2    Immunizations Immunization History  Administered Date(s) Administered   Fluad Quad(high Dose 65+) 11/05/2019   Influenza Whole 12/04/2008, 01/20/2010, 07/26/2010   Influenza, High Dose Seasonal PF 10/16/2015, 11/17/2016, 11/02/2017   Influenza,inj,Quad PF,6+ Mos 11/20/2012, 11/20/2013, 10/08/2014   Influenza-Unspecified 11/17/2016, 10/17/2020   PFIZER Comirnaty(Gray Top)Covid-19 Tri-Sucrose Vaccine 10/17/2020   PFIZER(Purple Top)SARS-COV-2 Vaccination 03/02/2019, 03/27/2019, 10/30/2019   Pneumococcal Conjugate-13 11/20/2013   Pneumococcal Polysaccharide-23 07/20/2006   Td 07/20/2006   Zoster Recombinat (Shingrix) 03/27/2018, 07/14/2018   Zoster, Live 09/09/2010    TDAP status: Due, Education has been provided regarding the importance of this vaccine. Advised may receive this vaccine at local pharmacy or Health Dept. Aware to provide a copy of the vaccination record if obtained from local pharmacy or Health Dept. Verbalized acceptance and understanding.  Flu Vaccine status: Up to date  Pneumococcal vaccine status: Up to date  Covid-19 vaccine status: Completed vaccines  Qualifies for Shingles Vaccine? Yes   Zostavax completed Yes   Shingrix Completed?: Yes  Screening Tests Health Maintenance  Topic Date Due   COLONOSCOPY (Pts 45-64yrs Insurance coverage will need to be confirmed)  05/13/2017   COVID-19 Vaccine (5 - Booster for Pfizer series) 12/12/2020    Pneumonia Vaccine 87+ Years old  Completed   INFLUENZA VACCINE  Completed   Zoster Vaccines- Shingrix  Completed   HPV VACCINES  Aged Out   TETANUS/TDAP  Discontinued    Health Maintenance  Health Maintenance Due  Topic Date Due   COLONOSCOPY (Pts 45-13yrs Insurance coverage will need to be confirmed)  05/13/2017   COVID-19 Vaccine (5 - Booster for Pfizer series) 12/12/2020    Colorectal cancer screening: Has an appointment with PA to see about getting colonoscopy  Lung Cancer Screening: (Low Dose CT Chest recommended if Age 60-80 years, 30 pack-year currently smoking OR have quit w/in 15years.) does not qualify.   Lung Cancer Screening Referral: no  Additional Screening:  Hepatitis C Screening: does not qualify;   Vision Screening: Recommended annual ophthalmology exams for early detection of glaucoma and other disorders of the eye. Is the patient up to date with their annual eye exam?  Yes  Who is the provider or what is the name of the office in which the patient attends annual eye exams? [facility] If pt is not established with a provider, would they like to be referred to a provider to establish care? No .   Dental Screening: Recommended annual dental exams for proper oral hygiene  Community Resource Referral / Chronic Care Management: CRR required this visit?  No   CCM required this visit?  No      Plan:     I have personally reviewed and noted the following in the patients chart:   Medical and social history Use of alcohol, tobacco or illicit drugs  Current medications and supplements including opioid prescriptions. Patient is not currently taking opioid prescriptions. Functional ability and status Nutritional status Physical activity Advanced directives List  of other physicians Hospitalizations, surgeries, and ER visits in previous 12 months Vitals Screenings to include cognitive, depression, and falls Referrals and appointments  In addition, I  have reviewed and discussed with patient certain preventive protocols, quality metrics, and best practice recommendations. A written personalized care plan for preventive services as well as general preventive health recommendations were provided to patient.     Kellie Simmering, LPN   07/28/1924   Nurse Notes: none  Due to this being a virtual visit, the after visit summary with patients personalized plan was offered to patient via mail or my-chart. Patient would like to access on my-chart

## 2021-03-03 NOTE — Patient Instructions (Signed)
Collin [name] , Thank you for taking time to come for your Medicare Wellness Visit. I appreciate your ongoing commitment to your health goals. Please review the following plan we discussed and let me know if I can assist you in the future.   Screening recommendations/referrals: Colonoscopy: has appointment with PA to see about colonoscopy Recommended yearly ophthalmology/optometry visit for glaucoma screening and checkup Recommended yearly dental visit for hygiene and checkup  Vaccinations: Influenza vaccine: completed 10/17/2020, due next flu season Pneumococcal vaccine: completed 11/20/2013 Tdap vaccine: n/a Shingles vaccine: completed   Covid-19:  10/17/2020, 10/30/2019, 03/27/2019, 03/02/2019  Advanced directives: Please bring a copy of your POA (Power of Attorney) and/or Living Will to your next appointment.   Conditions/risks identified: none  Next appointment: Follow up in one year for your annual wellness visit.   Preventive Care 3 Years and Older, Male Preventive care refers to lifestyle choices and visits with your health care provider that can promote health and wellness. What does preventive care include? A yearly physical exam. This is also called an annual well check. Dental exams once or twice a year. Routine eye exams. Ask your health care provider how often you should have your eyes checked. Personal lifestyle choices, including: Daily care of your teeth and gums. Regular physical activity. Eating a healthy diet. Avoiding tobacco and drug use. Limiting alcohol use. Practicing safe sex. Taking low doses of aspirin every day. Taking vitamin and mineral supplements as recommended by your health care provider. What happens during an annual well check? The services and screenings done by your health care provider during your annual well check will depend on your age, overall health, lifestyle risk factors, and family history of disease. Counseling  Your health care provider  may ask you questions about your: Alcohol use. Tobacco use. Drug use. Emotional well-being. Home and relationship well-being. Sexual activity. Eating habits. History of falls. Memory and ability to understand (cognition). Work and work Statistician. Screening  You may have the following tests or measurements: Height, weight, and BMI. Blood pressure. Lipid and cholesterol levels. These may be checked every 5 years, or more frequently if you are over 24 years old. Skin check. Lung cancer screening. You may have this screening every year starting at age 24 if you have a 30-pack-year history of smoking and currently smoke or have quit within the past 15 years. Fecal occult blood test (FOBT) of the stool. You may have this test every year starting at age 82. Flexible sigmoidoscopy or colonoscopy. You may have a sigmoidoscopy every 5 years or a colonoscopy every 10 years starting at age 1. Prostate cancer screening. Recommendations will vary depending on your family history and other risks. Hepatitis C blood test. Hepatitis B blood test. Sexually transmitted disease (STD) testing. Diabetes screening. This is done by checking your blood sugar (glucose) after you have not eaten for a while (fasting). You may have this done every 1-3 years. Abdominal aortic aneurysm (AAA) screening. You may need this if you are a current or former smoker. Osteoporosis. You may be screened starting at age 22 if you are at high risk. Talk with your health care provider about your test results, treatment options, and if necessary, the need for more tests. Vaccines  Your health care provider may recommend certain vaccines, such as: Influenza vaccine. This is recommended every year. Tetanus, diphtheria, and acellular pertussis (Tdap, Td) vaccine. You may need a Td booster every 10 years. Zoster vaccine. You may need this after age 79. Pneumococcal  13-valent conjugate (PCV13) vaccine. One dose is recommended after  age 75. Pneumococcal polysaccharide (PPSV23) vaccine. One dose is recommended after age 55. Talk to your health care provider about which screenings and vaccines you need and how often you need them. This information is not intended to replace advice given to you by your health care provider. Make sure you discuss any questions you have with your health care provider. Document Released: 02/07/2015 Document Revised: 10/01/2015 Document Reviewed: 11/12/2014 Elsevier Interactive Patient Education  2017 [place] Prevention in the Home Falls can cause injuries. They can happen to people of all ages. There are many things you can do to make your home safe and to help prevent falls. What can I do on the outside of my home? Regularly fix the edges of walkways and driveways and fix any cracks. Remove anything that might make you trip as you walk through a door, such as a raised step or threshold. Trim any bushes or trees on the path to your home. Use bright outdoor lighting. Clear any walking paths of anything that might make someone trip, such as rocks or tools. Regularly check to see if handrails are loose or broken. Make sure that both sides of any steps have handrails. Any raised decks and porches should have guardrails on the edges. Have any leaves, snow, or ice cleared regularly. Use sand or salt on walking paths during winter. Clean up any spills in your garage right away. This includes oil or grease spills. What can I do in the bathroom? Use night lights. Install grab bars by the toilet and in the tub and shower. Do not use towel bars as grab bars. Use non-skid mats or decals in the tub or shower. If you need to sit down in the shower, use a plastic, non-slip stool. Keep the floor dry. Clean up any water that spills on the floor as soon as it happens. Remove soap buildup in the tub or shower regularly. Attach bath mats securely with double-sided non-slip rug tape. Do not have  throw rugs and other things on the floor that can make you trip. What can I do in the bedroom? Use night lights. Make sure that you have a light by your bed that is easy to reach. Do not use any sheets or blankets that are too big for your bed. They should not hang down onto the floor. Have a firm chair that has side arms. You can use this for support while you get dressed. Do not have throw rugs and other things on the floor that can make you trip. What can I do in the kitchen? Clean up any spills right away. Avoid walking on wet floors. Keep items that you use a lot in easy-to-reach places. If you need to reach something above you, use a strong step stool that has a grab bar. Keep electrical cords out of the way. Do not use floor polish or wax that makes floors slippery. If you must use wax, use non-skid floor wax. Do not have throw rugs and other things on the floor that can make you trip. What can I do with my stairs? Do not leave any items on the stairs. Make sure that there are handrails on both sides of the stairs and use them. Fix handrails that are broken or loose. Make sure that handrails are as long as the stairways. Check any carpeting to make sure that it is firmly attached to the stairs. Fix any carpet  that is loose or worn. Avoid having throw rugs at the top or bottom of the stairs. If you do have throw rugs, attach them to the floor with carpet tape. Make sure that you have a light switch at the top of the stairs and the bottom of the stairs. If you do not have them, ask someone to add them for you. What else can I do to help prevent falls? Wear shoes that: Do not have high heels. Have rubber bottoms. Are comfortable and fit you well. Are closed at the toe. Do not wear sandals. If you use a stepladder: Make sure that it is fully opened. Do not climb a closed stepladder. Make sure that both sides of the stepladder are locked into place. Ask someone to hold it for you, if  possible. Clearly mark and make sure that you can see: Any grab bars or handrails. First and last steps. Where the edge of each step is. Use tools that help you move around (mobility aids) if they are needed. These include: Canes. Walkers. Scooters. Crutches. Turn on the lights when you go into a dark area. Replace any light bulbs as soon as they burn out. Set up your furniture so you have a clear path. Avoid moving your furniture around. If any of your floors are uneven, fix them. If there are any pets around you, be aware of where they are. Review your medicines with your doctor. Some medicines can make you feel dizzy. This can increase your chance of falling. Ask your doctor what other things that you can do to help prevent falls. This information is not intended to replace advice given to you by your health care provider. Make sure you discuss any questions you have with your health care provider. Document Released: 11/07/2008 Document Revised: 06/19/2015 Document Reviewed: 02/15/2014 Elsevier Interactive Patient Education  2017 [employer].

## 2021-03-11 ENCOUNTER — Ambulatory Visit: Payer: PPO | Admitting: Nurse Practitioner

## 2021-03-11 ENCOUNTER — Encounter: Payer: Self-pay | Admitting: Nurse Practitioner

## 2021-03-11 VITALS — BP 126/68 | HR 68 | Ht 72.5 in | Wt 195.0 lb

## 2021-03-11 DIAGNOSIS — Z86010 Personal history of colonic polyps: Secondary | ICD-10-CM | POA: Diagnosis not present

## 2021-03-11 NOTE — Progress Notes (Signed)
ASSESSMENT AND PLAN    #81 year old male with history of adenomatous colon polyps.  He had 4 small adenomas removed in 2016.  He was sent a letter regarding an office visit to discuss surveillance colonoscopy.  Patient looks and feels well.  He exercises on a regular basis.  He expects to live several more years and does not want to die of something preventable like colon cancer.  He would like to proceed with surveillance colonoscopy --Schedule for a colonoscopy. The risks and benefits of colonoscopy with possible polypectomy / biopsies were discussed and the patient agrees to proceed.    HISTORY OF PRESENT ILLNESS     Chief Complaint : none. Has history of polyps, it is time for colonoscopy and he would like to schedule it Collin [name] is a 81 y.o. male with a past medical history significant for diverticulosis, colon polyps, CAD s/p remote MI, HTN, BPH, osteopenia,  arthritis. See PMH below for any additional history.   Patient known to Dr. Carlean Purl.  He has a history of adenomatous colon polyps.  He was due for a 3-year surveillance colonoscopy in 2019. He comes in today and would like to get that scheduled. He looks and feels well. No chest pain, shob or other general medical complaints. He gets regular exercise, tries to eat healthy. He expects to live several more years and doesn't want to die of something preventable like colon cancer. He hasn't had any blood in his stool. No bowel changes   Data Reviewed:  CBC Latest Ref Rng & Units 01/01/2020 06/04/2019 01/09/2018  WBC 3.4 - 10.8 x10E3/uL 8.6 6.6 7.9  Hemoglobin 13.0 - 17.7 g/dL 15.1 15.3 15.4  Hematocrit 37.5 - 51.0 % 44.7 45.9 48.1  Platelets 150 - 450 x10E3/uL 200 201.0 197    No results found for: LIPASE CMP Latest Ref Rng & Units 12/03/2020 01/16/2020 01/01/2020  Glucose 70 - 99 mg/dL 106(H) 65 93  BUN 6 - 23 mg/dL 21 19 19   Creatinine 0.40 - 1.50 mg/dL 1.16 1.16 1.30(H)  Sodium 135 - 145 mEq/L 138 138 138   Potassium 3.5 - 5.1 mEq/L 4.4 4.3 5.4(H)  Chloride 96 - 112 mEq/L 104 101 102  CO2 19 - 32 mEq/L 29 25 25   Calcium 8.4 - 10.5 mg/dL 9.3 9.2 9.6  Total Protein 6.0 - 8.3 g/dL 6.8 - -  Total Bilirubin 0.2 - 1.2 mg/dL 0.9 - -  Alkaline Phos 39 - 117 U/L 39 - -  AST 0 - 37 U/L 31 - -  ALT 0 - 53 U/L 24 - -    PREVIOUS GI EVALUATIONS:    April 2016 colonoscopy  -Four sessile polyps ranging from 2 to 41mm in size were found at the cecum and in the ascending colon; polypectomies were performed with cold forceps and with a cold snare. Scattered diverticulosis Internal hemorrhoids Otherwise normal - good prep-  Polyp path - 4 small TAs   Past Medical History:  Diagnosis Date   Allergy    Arthritis    BPH (benign prostatic hyperplasia)    CAD (coronary artery disease)    Cataract    Diverticulosis of colon    Hx of adenomatous colonic polyps 05/22/2014   Hyperlipidemia    Hypertension    MYOCARDIAL INFARCTION, HX OF 07/20/2006   Qualifier: Diagnosis of  By: Leanne Chang MD, Bruce       Past Surgical History:  Procedure Laterality Date   CATARACT EXTRACTION Bilateral  Bil   COLONOSCOPY     LUMBAR LAMINECTOMY/DECOMPRESSION MICRODISCECTOMY Left 01/20/2018   Procedure: Left Lumbar five Sacral one extraforaminal decompression;  Surgeon: Eustace Moore, MD;  Location: [place];  Service: Neurosurgery;  Laterality: Left;   PTCA     Family History  Problem Relation Age of Onset   Heart disease Mother        CABG   Dementia Mother 17   Stroke Father    Kidney disease Father        renal failure   AAA (abdominal aortic aneurysm) Father    Hypertension Sister    Diabetes Brother    AAA (abdominal aortic aneurysm) Paternal Uncle    Breast cancer Daughter    Colon cancer Neg Hx    Social History   Tobacco Use   Smoking status: Never   Smokeless tobacco: Never  Vaping Use   Vaping Use: Never used  Substance Use Topics   Alcohol use: Yes    Comment: wine once a week and beer once  a week   Drug use: No   Current Outpatient Medications  Medication Sig Dispense Refill   aspirin 81 MG tablet Take 1 tablet (81 mg total) by mouth at bedtime. 30 tablet    Cholecalciferol (VITAMIN D3) 125 MCG (5000 UT) CAPS Take 5,000 Units by mouth at bedtime.     Coenzyme Q10 300 MG CAPS Take 300 mg by mouth daily.     Cyanocobalamin (VITAMIN B-12) 2500 MCG SUBL Place 1 tablet under the tongue daily.     fish oil-omega-3 fatty acids 1000 MG capsule Take 1 g by mouth daily.     gabapentin (NEURONTIN) 100 MG capsule TAKE 1 CAPSULE(100 MG) BY MOUTH TWICE DAILY 180 capsule 1   Glucosamine-Chondroitin (MOVE FREE PO) Take 1 tablet by mouth daily.     metoprolol succinate (TOPROL-XL) 25 MG 24 hr tablet Take 1 tablet (25 mg total) by mouth daily. Please keep upcoming appointment in Feb. 2023 for future refills. Thank you 90 tablet 0   Multiple Vitamin (MULTIVITAMIN) tablet Take 1 tablet by mouth daily.     nitroGLYCERIN (NITROSTAT) 0.4 MG SL tablet Place 1 tablet (0.4 mg total) under the tongue every 5 (five) minutes as needed for chest pain. 25 tablet 3   Probiotic Product (PROBIOTIC ADVANCED PO) Take 1 capsule by mouth daily.     simvastatin (ZOCOR) 40 MG tablet Take 1 tablet (40 mg total) by mouth daily at 6 PM. Please make overdue appt with Dr. Burt Knack before anymore refills. Thank you 1st attempt 30 tablet 0   tamsulosin (FLOMAX) 0.4 MG CAPS capsule Take 1 capsule (0.4 mg total) by mouth daily. 90 capsule 2   finasteride (PROSCAR) 5 MG tablet Take 1 tablet (5 mg total) by mouth daily. (Patient not taking: Reported on 03/03/2021) 90 tablet 2   No current facility-administered medications for this visit.   Allergies  Allergen Reactions   Penicillins Rash and Other (See Comments)    PATIENT HAS HAD A PCN REACTION WITH IMMEDIATE RASH, FACIAL/TONGUE/THROAT SWELLING, SOB, OR LIGHTHEADEDNESS WITH HYPOTENSION:  #  #  YES  #  #  Has patient had a PCN reaction causing severe rash involving mucus  membranes or skin necrosis: No Has patient had a PCN reaction that required hospitalization: No Has patient had a PCN reaction occurring within the last 10 years: No If all of the above answers are "NO", then may proceed with Cephalosporin use.     Review  of Systems: All systems reviewed and negative except where noted in HPI.    PHYSICAL EXAM :    Wt Readings from Last 3 Encounters:  03/11/21 195 lb (88.5 kg)  03/03/21 192 lb (87.1 kg)  12/10/20 193 lb 6.4 oz (87.7 kg)    BP 126/68 (BP Location: Left Arm, Patient Position: Sitting, Cuff Size: Normal)    Pulse 68    Ht 6' 0.5" (1.842 m) Comment: height measured without shoes   Wt 195 lb (88.5 kg)    BMI 26.08 kg/m  Constitutional:  Generally well appearing male in no acute distress. Psychiatric: Pleasant. Normal mood and affect. Behavior is normal. EENT: Pupils normal.  Conjunctivae are normal. No scleral icterus. Neck supple.  Cardiovascular: Normal rate, regular rhythm. Soft murmur. No edema Pulmonary/chest: Effort normal and breath sounds normal. No wheezing, rales or rhonchi. Abdominal: Soft, nondistended, nontender. Bowel sounds active throughout. There are no masses palpable. No hepatomegaly. Neurological: Alert and oriented to person place and time. Skin: Skin is warm and dry. No rashes noted.  Tye Savoy, NP  03/11/2021, 2:21 PM  Cc:  Referring Provider Martinique, Betty G, MD

## 2021-03-11 NOTE — Patient Instructions (Signed)
PROCEDURES: You have been scheduled for a colonoscopy. Please follow the written instructions given to you at your visit today. If you use inhalers (even only as needed), please bring them with you on the day of your procedure.   BMI:  If you are age 83 or older, your body mass index should be between 23-30. Your Body mass index is 26.08 kg/m. If this is out of the aforementioned range listed, please consider follow up with your Primary Care Provider.  If you are age 46 or younger, your body mass index should be between 19-25. Your Body mass index is 26.08 kg/m. If this is out of the aformentioned range listed, please consider follow up with your Primary Care Provider.   MY CHART:  The [facility] providers would like to encourage you to use [facility] to communicate with providers for non-urgent requests or questions.  Due to long hold times on the telephone, sending your provider a message by [facility] may be a faster and more efficient way to get a response.  Please allow 48 business hours for a response.  Please remember that this is for non-urgent requests.   Thank you for trusting me with your gastrointestinal care!    Tye Savoy, NP

## 2021-03-15 ENCOUNTER — Other Ambulatory Visit: Payer: Self-pay | Admitting: Cardiovascular Disease

## 2021-03-16 ENCOUNTER — Telehealth: Payer: Self-pay | Admitting: Cardiovascular Disease

## 2021-03-16 NOTE — Telephone Encounter (Signed)
°  Pt c/o medication issue:  1. Name of Medication: metoprolol succinate (TOPROL-XL) 25 MG 24 hr tablet  2. How are you currently taking this medication (dosage and times per day)? Take 1 tablet (25 mg total) by mouth daily. Please keep upcoming appointment in Feb. 2023 for future refills. Thank you  3. Are you having a reaction (difficulty breathing--STAT)? no  4. What is your medication issue? Calling to get prior authorization for this medication

## 2021-03-17 ENCOUNTER — Other Ambulatory Visit: Payer: Self-pay | Admitting: Cardiovascular Disease

## 2021-03-17 NOTE — Telephone Encounter (Signed)
**Note De-Identified  Obfuscation** I called [facility] pharmacy and was advised that the pt picked his Metoprolol up on 03/06/2021 and paid $27/90 day supply and that a PA is not required.

## 2021-03-23 ENCOUNTER — Ambulatory Visit: Payer: PPO | Admitting: Cardiovascular Disease

## 2021-03-23 ENCOUNTER — Encounter: Payer: Self-pay | Admitting: Cardiovascular Disease

## 2021-03-23 ENCOUNTER — Other Ambulatory Visit: Payer: Self-pay

## 2021-03-23 ENCOUNTER — Other Ambulatory Visit: Payer: Self-pay | Admitting: Cardiovascular Disease

## 2021-03-23 VITALS — BP 130/80 | HR 66 | Ht 73.0 in | Wt 197.8 lb

## 2021-03-23 DIAGNOSIS — I10 Essential (primary) hypertension: Secondary | ICD-10-CM

## 2021-03-23 DIAGNOSIS — I2510 Atherosclerotic heart disease of native coronary artery without angina pectoris: Secondary | ICD-10-CM | POA: Diagnosis not present

## 2021-03-23 DIAGNOSIS — E782 Mixed hyperlipidemia: Secondary | ICD-10-CM | POA: Diagnosis not present

## 2021-03-23 MED ORDER — METOPROLOL SUCCINATE ER 25 MG PO TB24: 25.0000 mg | ORAL_TABLET | Freq: Every day | ORAL | 3 refills | Status: DC

## 2021-03-23 NOTE — Patient Instructions (Signed)
Medication Instructions:  Continue Metoprolol Succinate *If you need a refill on your cardiac medications before your next appointment, please call your pharmacy*   Lab Work: NONE If you have labs (blood work) drawn today and your tests are completely normal, you will receive your results only by: [place] (if you have MyChart) OR A paper copy in the mail If you have any lab test that is abnormal or we need to change your treatment, we will call you to review the results.   Testing/Procedures: NONE   Follow-Up: At [facility], you and your health needs are our priority.  As part of our continuing mission to provide you with exceptional heart care, we have created designated Provider Care Teams.  These Care Teams include your primary Cardiologist (physician) and Advanced Practice Providers (APPs -  Physician Assistants and Nurse Practitioners) who all work together to provide you with the care you need, when you need it.   Your next appointment:   1 year(s)  The format for your next appointment:   In Person  Provider:   Sherren Mocha, MD

## 2021-03-23 NOTE — Progress Notes (Signed)
Cardiology Office Note:    Date:  03/23/2021   ID:  Collin [name], DOB [birth year], MRN [id number]  PCP:  Martinique, Betty G, MD   [facility] Providers Cardiologist:  Sherren Mocha, MD     Referring MD: Martinique, Betty G, MD   Chief Complaint  Patient presents with   Coronary Artery Disease    History of Present Illness:    Collin [name] is a 81 y.o. male with a hx of coronary artery disease, presenting for follow-up evaluation. His cardiac hx is pertinent for:  Coronary artery disease  S/p remote inferior MI in 1998 tx with POBA to RCA  Mid LAD disease tx medically (beta-blocker) Cath 9/13: mLAD 80; oD1 80, pD2 80; mLCx 50, OM1 50-60; mRCA 30 Lumbar disc dz S/p L5-S1 microdiscectomy 12/19 Hypertension  Hyperlipidemia  BPH  The patient has been doing well.  He plays golf a few days per week.  He is able to walk a few miles on track without any symptoms.  He also goes to the gym a few days per week.  With all of these activities, he denies any exertional chest pain or pressure.  No dyspnea, orthopnea, PND, or heart palpitations.  Past Medical History:  Diagnosis Date   Allergy    Arthritis    BPH (benign prostatic hyperplasia)    CAD (coronary artery disease)    Cataract    Diverticulosis of colon    Hx of adenomatous colonic polyps 05/22/2014   Hyperlipidemia    Hypertension    MYOCARDIAL INFARCTION, HX OF 07/20/2006   Qualifier: Diagnosis of  By: Leanne Chang MD, Bruce      Past Surgical History:  Procedure Laterality Date   CATARACT EXTRACTION Bilateral    Bil   COLONOSCOPY     LUMBAR LAMINECTOMY/DECOMPRESSION MICRODISCECTOMY Left 01/20/2018   Procedure: Left Lumbar five Sacral one extraforaminal decompression;  Surgeon: Eustace Moore, MD;  Location: [place];  Service: Neurosurgery;  Laterality: Left;   PTCA      Current Medications: Current Meds  Medication Sig   aspirin 81 MG tablet Take 1 tablet (81 mg total) by mouth at bedtime.   Cholecalciferol  (VITAMIN D3) 125 MCG (5000 UT) CAPS Take 5,000 Units by mouth at bedtime.   Coenzyme Q10 300 MG CAPS Take 300 mg by mouth daily.   Cyanocobalamin (VITAMIN B-12) 2500 MCG SUBL Place 1 tablet under the tongue daily.   finasteride (PROSCAR) 5 MG tablet Take 1 tablet (5 mg total) by mouth daily.   fish oil-omega-3 fatty acids 1000 MG capsule Take 1 g by mouth daily.   gabapentin (NEURONTIN) 100 MG capsule TAKE 1 CAPSULE(100 MG) BY MOUTH TWICE DAILY   Glucosamine-Chondroitin (MOVE FREE PO) Take 1 tablet by mouth daily.   Multiple Vitamin (MULTIVITAMIN) tablet Take 1 tablet by mouth daily.   nitroGLYCERIN (NITROSTAT) 0.4 MG SL tablet Place 1 tablet (0.4 mg total) under the tongue every 5 (five) minutes as needed for chest pain.   Probiotic Product (PROBIOTIC ADVANCED PO) Take 1 capsule by mouth daily.   simvastatin (ZOCOR) 40 MG tablet Take 1 tablet (40 mg total) by mouth daily at 6 PM. Please keep upcoming appt for future refills Thank you 2nd attempt   tamsulosin (FLOMAX) 0.4 MG CAPS capsule Take 1 capsule (0.4 mg total) by mouth daily.   [DISCONTINUED] metoprolol succinate (TOPROL-XL) 25 MG 24 hr tablet Take 1 tablet (25 mg total) by mouth daily. Please keep upcoming appointment in Feb. 2023 for  future refills. Thank you     Allergies:   Penicillins   Social History   Socioeconomic History   Marital status: Married    Spouse name: Not on file   Number of children: 3   Years of education: 16   Highest education level: Not on file  Occupational History   Occupation: retired  Tobacco Use   Smoking status: Never   Smokeless tobacco: Never  Vaping Use   Vaping Use: Never used  Substance and Sexual Activity   Alcohol use: Yes    Comment: wine once a week and beer once a week   Drug use: No   Sexual activity: Not on file  Other Topics Concern   Not on file  Social History Narrative   Lives with wife in a 2 story home.  Has 3 children.     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    Education: college.    Occasional beer, no smoking, no drugs   Social Determinants of Radio broadcast assistant Strain: Low Risk    Difficulty of Paying Living Expenses: Not hard at all  Food Insecurity: No Food Insecurity   Worried About Charity fundraiser in the Last Year: Never true   Ran Out of Food in the Last Year: Never true  Transportation Needs: No Transportation Needs   Lack of Transportation (Medical): No   Lack of Transportation (Non-Medical): No  Physical Activity: Sufficiently Active   Days of Exercise per Week: 6 days   Minutes of Exercise per Session: 90 min  Stress: No Stress Concern Present   Feeling of Stress : Not at all  Social Connections: Not on file     Family History: The patient's family history includes AAA (abdominal aortic aneurysm) in his father and paternal uncle; Breast cancer in his daughter; Dementia (age of onset: 24) in his mother; Diabetes in his brother; Heart disease in his mother; Hypertension in his sister; Kidney disease in his father; Stroke in his father. There is no history of Colon cancer.  ROS:   Please see the history of present illness.    All other systems reviewed and are negative.  EKGs/Labs/Other Studies Reviewed:    EKG:  EKG is ordered today.  The ekg ordered today demonstrates Normal sinus rhythm 66 bpm, within normal limits.  Recent Labs: 12/03/2020: ALT 24; BUN 21; Creatinine, Ser 1.16; Potassium 4.4; Sodium 138  Recent Lipid Panel    Component Value Date/Time   CHOL 122 12/03/2020 0837   TRIG 47.0 12/03/2020 0837   HDL 61.10 12/03/2020 0837   CHOLHDL 2 12/03/2020 0837   VLDL 9.4 12/03/2020 0837   LDLCALC 52 12/03/2020 0837   LDLCALC 54 11/05/2019 0828     Risk Assessment/Calculations:           Physical Exam:    VS:  BP 130/80    Pulse 66    Ht 6\' 1"  (1.854 m)    Wt 197 lb 12.8 oz (89.7 kg)    SpO2 95%    BMI 26.10 kg/m     Wt Readings from Last 3 Encounters:  03/23/21 197 lb 12.8  oz (89.7 kg)  03/11/21 195 lb (88.5 kg)  03/03/21 192 lb (87.1 kg)     GEN:  Well nourished, well developed in no acute distress HEENT: Normal NECK: No JVD; No carotid bruits LYMPHATICS: No lymphadenopathy CARDIAC: RRR, no murmurs, rubs, gallops RESPIRATORY:  Clear to auscultation without rales, wheezing or  rhonchi  ABDOMEN: Soft, non-tender, non-distended MUSCULOSKELETAL:  No edema; No deformity  SKIN: Warm and dry NEUROLOGIC:  Alert and oriented x 3 PSYCHIATRIC:  Normal affect   ASSESSMENT:    1. Coronary artery disease involving native coronary artery of native heart without angina pectoris   2. Mixed hyperlipidemia   3. Essential hypertension    PLAN:    In order of problems listed above:  Patient is stable with no symptoms of angina.  He is counseled to remain on low-dose aspirin, and a long-acting beta-blocker, and a statin drug.  He has an excellent functional capacity considering his age, remaining active with golf and regular exercise.  He has no cardiac related functional limitation. He is treated with simvastatin.  Lipids from November 2022 reviewed with a cholesterol 122, HDL 61, LDL 52.  ALT was 24.  Continue current management. Blood pressure is controlled on metoprolol succinate.  This was changed from a tier 1 to a tier 2 drug in his insurance plan.  He is trying to appeal this to keep the drug cost.  We will reach out to his pharmacy to see the reason for denial.     Medication Adjustments/Labs and Tests Ordered: Current medicines are reviewed at length with the patient today.  Concerns regarding medicines are outlined above.  Orders Placed This Encounter  Procedures   EKG 12-Lead   Meds ordered this encounter  Medications   metoprolol succinate (TOPROL-XL) 25 MG 24 hr tablet    Sig: Take 1 tablet (25 mg total) by mouth daily.    Dispense:  90 tablet    Refill:  3    Patient Instructions  Medication Instructions:  Continue Metoprolol Succinate *If you  need a refill on your cardiac medications before your next appointment, please call your pharmacy*   Lab Work: NONE If you have labs (blood work) drawn today and your tests are completely normal, you will receive your results only by: [place] (if you have MyChart) OR A paper copy in the mail If you have any lab test that is abnormal or we need to change your treatment, we will call you to review the results.   Testing/Procedures: NONE   Follow-Up: At [facility], you and your health needs are our priority.  As part of our continuing mission to provide you with exceptional heart care, we have created designated Provider Care Teams.  These Care Teams include your primary Cardiologist (physician) and Advanced Practice Providers (APPs -  Physician Assistants and Nurse Practitioners) who all work together to provide you with the care you need, when you need it.   Your next appointment:   1 year(s)  The format for your next appointment:   In Person  Provider:   Sherren Mocha, MD         Signed, Sherren Mocha, MD  03/23/2021 5:33 PM    [place]

## 2021-03-30 ENCOUNTER — Telehealth: Payer: Self-pay | Admitting: Cardiovascular Disease

## 2021-03-30 NOTE — Telephone Encounter (Signed)
Pt c/o medication issue: ? ?1. Name of Medication:  ?metoprolol succinate (TOPROL-XL) 25 MG 24 hr tablet ? ?2. How are you currently taking this medication (dosage and times per day)? N/A ? ?3. Are you having a reaction (difficulty breathing--STAT)? N/A ? ?4. What is your medication issue? [employer] is calling stating they received an urgent request for this medication from the patient. They are needing to confirm the reason for metoprolol succinate being prescribed for the patient. Such as if other metoprolol's have been tried or if the patient is in heart failure. He reports they only have until 1:00 PM today for this request due to it being urgent, requesting a callback as soon as possible due to this. Please advise.  ?

## 2021-03-30 NOTE — Telephone Encounter (Signed)
Spoke with Collin [name] at [facility] and advised on reasoning for patient being on Toprol. I do not see that he has tried any other beta-blockers. He has been tolerating Toprol for a while now.  ?

## 2021-04-03 ENCOUNTER — Encounter: Payer: Self-pay | Admitting: Cardiovascular Disease

## 2021-04-03 NOTE — Progress Notes (Signed)
Received fax today dated 03/30/21 from [employer] that states they have increased the tiering standards for Metoprolol Succinate ER '25mg'$ . This drug is now considered tier 2 on this plan and pt must have tried and failed a formulary alternative in the same therapeutic class situated at a lower tier than the product requested. Called and spoke to patient who states he has paid cash price at his pharmacy for this medication and does not wish to change as it is working for him. Pt will let us know in the future if this changes. ?

## 2021-04-27 ENCOUNTER — Encounter: Payer: Self-pay | Admitting: Internal Medicine

## 2021-05-05 ENCOUNTER — Encounter: Payer: Self-pay | Admitting: Internal Medicine

## 2021-05-05 ENCOUNTER — Ambulatory Visit (AMBULATORY_SURGERY_CENTER): Payer: PPO | Admitting: Internal Medicine

## 2021-05-05 VITALS — BP 121/72 | HR 48 | Temp 98.1°F | Resp 11 | Ht 72.0 in | Wt 195.0 lb

## 2021-05-05 DIAGNOSIS — I252 Old myocardial infarction: Secondary | ICD-10-CM | POA: Diagnosis not present

## 2021-05-05 DIAGNOSIS — I10 Essential (primary) hypertension: Secondary | ICD-10-CM | POA: Diagnosis not present

## 2021-05-05 DIAGNOSIS — D120 Benign neoplasm of cecum: Secondary | ICD-10-CM

## 2021-05-05 DIAGNOSIS — Z86010 Personal history of colonic polyps: Secondary | ICD-10-CM | POA: Diagnosis not present

## 2021-05-05 DIAGNOSIS — I2510 Atherosclerotic heart disease of native coronary artery without angina pectoris: Secondary | ICD-10-CM | POA: Diagnosis not present

## 2021-05-05 MED ORDER — SODIUM CHLORIDE 0.9 % IV SOLN: 500.0000 mL | INTRAVENOUS | Status: DC

## 2021-05-05 NOTE — Patient Instructions (Addendum)
I saw one tiny polyp and removed it. ?Also saw diverticulosis and internal hemorrhoids as in past. ? ?I appreciate the opportunity to care for you. ?Gatha Mayer, MD, [facility] ? ?YOU HAD AN ENDOSCOPIC PROCEDURE TODAY AT [facility]:   Refer to the procedure report that was given to you for any specific questions about what was found during the examination.  If the procedure report does not answer your questions, please call your gastroenterologist to clarify.  If you requested that your care partner not be given the details of your procedure findings, then the procedure report has been included in a sealed envelope for you to review at your convenience later. ? ?YOU SHOULD EXPECT: Some feelings of bloating in the abdomen. Passage of more gas than usual.  Walking can help get rid of the air that was put into your GI tract during the procedure and reduce the bloating. If you had a lower endoscopy (such as a colonoscopy or flexible sigmoidoscopy) you may notice spotting of blood in your stool or on the toilet paper. If you underwent a bowel prep for your procedure, you may not have a normal bowel movement for a few days. ? ?Please Note:  You might notice some irritation and congestion in your nose or some drainage.  This is from the oxygen used during your procedure.  There is no need for concern and it should clear up in a day or so. ? ?SYMPTOMS TO REPORT IMMEDIATELY: ? ?Following lower endoscopy (colonoscopy or flexible sigmoidoscopy): ? Excessive amounts of blood in the stool ? Significant tenderness or worsening of abdominal pains ? Swelling of the abdomen that is new, acute ? Fever of 100?F or higher ? ?For urgent or emergent issues, a gastroenterologist can be reached at any hour by calling [phone]. ?Do not use MyChart messaging for urgent concerns.  ? ? ?DIET:  We do recommend a small meal at first, but then you may proceed to your regular diet.  Drink plenty of fluids but you should avoid  alcoholic beverages for 24 hours. ? ?ACTIVITY:  You should plan to take it easy for the rest of today and you should NOT DRIVE or use heavy machinery until tomorrow (because of the sedation medicines used during the test).   ? ?FOLLOW UP: ?Our staff will call the number listed on your records 48-72 hours following your procedure to check on you and address any questions or concerns that you may have regarding the information given to you following your procedure. If we do not reach you, we will leave a message.  We will attempt to reach you two times.  During this call, we will ask if you have developed any symptoms of COVID 19. If you develop any symptoms (ie: fever, flu-like symptoms, shortness of breath, cough etc.) before then, please call [phone].  If you test positive for Covid 19 in the 2 weeks post procedure, please call and report this information to [place].   ? ?If any biopsies were taken you will be contacted by phone or by letter within the next 1-3 weeks.  Please call us at [phone] if you have not heard about the biopsies in 3 weeks.  ? ? ?SIGNATURES/CONFIDENTIALITY: ?You and/or your care partner have signed paperwork which will be entered into your electronic medical record.  These signatures attest to the fact that that the information above on your After Visit Summary has been reviewed and is understood.  Full responsibility of  the confidentiality of this discharge information lies with you and/or your care-partner.  ?

## 2021-05-05 NOTE — Progress Notes (Signed)
[facility] History and Physical ? ? ?Primary Care Physician:  Martinique, Betty G, MD ? ? ?Reason for Procedure:   Hx adenomatous colon polyps ? ?Plan:    colonoscopy ? ? ? ? ?HPI: Collin [name] is a 81 y.o. male here for surveillance colonoscopy - w/ hx 4 adenomas 2016. He was seen in office and is fit for age and desired a surveillance colonoscopy. ? ? ?Past Medical History:  ?Diagnosis Date  ? Allergy   ? Arthritis   ? BPH (benign prostatic hyperplasia)   ? CAD (coronary artery disease)   ? Cataract   ? Diverticulosis of colon   ? Hx of adenomatous colonic polyps 05/22/2014  ? Hyperlipidemia   ? Hypertension   ? MYOCARDIAL INFARCTION, HX OF 07/20/2006  ? Qualifier: Diagnosis of  By: Leanne Chang MD, Bruce    ? ? ?Past Surgical History:  ?Procedure Laterality Date  ? CATARACT EXTRACTION Bilateral   ? Bil  ? COLONOSCOPY    ? LUMBAR LAMINECTOMY/DECOMPRESSION MICRODISCECTOMY Left 01/20/2018  ? Procedure: Left Lumbar five Sacral one extraforaminal decompression;  Surgeon: Eustace Moore, MD;  Location: [facility];  Service: Neurosurgery;  Laterality: Left;  ? PTCA    ? ? ?Prior to Admission medications   ?Medication Sig Start Date End Date Taking? Authorizing Provider  ?aspirin 81 MG tablet Take 1 tablet (81 mg total) by mouth at bedtime. 01/28/18  Yes Costella, Vista Mink, PA-C  ?Cholecalciferol (VITAMIN D3) 125 MCG (5000 UT) CAPS Take 5,000 Units by mouth at bedtime.   Yes [provider]  ?Coenzyme Q10 300 MG CAPS Take 300 mg by mouth daily.   Yes [provider]  ?Cyanocobalamin (VITAMIN B-12) 2500 MCG SUBL Place 1 tablet under the tongue daily.   Yes [provider]  ?fish oil-omega-3 fatty acids 1000 MG capsule Take 1 g by mouth daily.   Yes [provider]  ?gabapentin (NEURONTIN) 100 MG capsule TAKE 1 CAPSULE(100 MG) BY MOUTH TWICE DAILY 01/23/21  Yes Martinique, Betty G, MD  ?Glucosamine-Chondroitin (MOVE FREE PO) Take 1 tablet by mouth daily.   Yes [provider]   ?metoprolol succinate (TOPROL-XL) 25 MG 24 hr tablet Take 1 tablet (25 mg total) by mouth daily. 03/23/21  Yes Sherren Mocha, MD  ?Multiple Vitamin (MULTIVITAMIN) tablet Take 1 tablet by mouth daily.   Yes [provider]  ?Probiotic Product (PROBIOTIC ADVANCED PO) Take 1 capsule by mouth daily.   Yes [provider]  ?simvastatin (ZOCOR) 40 MG tablet TAKE 1 TABLET BY MOUTH DAILY AT 6:00 PM 03/24/21  Yes Josue Hector, MD  ?tamsulosin (FLOMAX) 0.4 MG CAPS capsule Take 1 capsule (0.4 mg total) by mouth daily. 12/10/20  Yes Martinique, Betty G, MD  ?finasteride (PROSCAR) 5 MG tablet Take 1 tablet (5 mg total) by mouth daily. ?Patient not taking: Reported on 05/05/2021 12/10/20   Martinique, Betty G, MD  ?nitroGLYCERIN (NITROSTAT) 0.4 MG SL tablet Place 1 tablet (0.4 mg total) under the tongue every 5 (five) minutes as needed for chest pain. 12/28/17   Sherren Mocha, MD  ? ? ?Current Outpatient Medications  ?Medication Sig Dispense Refill  ? aspirin 81 MG tablet Take 1 tablet (81 mg total) by mouth at bedtime. 30 tablet   ? Cholecalciferol (VITAMIN D3) 125 MCG (5000 UT) CAPS Take 5,000 Units by mouth at bedtime.    ? Coenzyme Q10 300 MG CAPS Take 300 mg by mouth daily.    ? Cyanocobalamin (VITAMIN B-12) 2500 MCG  SUBL Place 1 tablet under the tongue daily.    ? fish oil-omega-3 fatty acids 1000 MG capsule Take 1 g by mouth daily.    ? gabapentin (NEURONTIN) 100 MG capsule TAKE 1 CAPSULE(100 MG) BY MOUTH TWICE DAILY 180 capsule 1  ? Glucosamine-Chondroitin (MOVE FREE PO) Take 1 tablet by mouth daily.    ? metoprolol succinate (TOPROL-XL) 25 MG 24 hr tablet Take 1 tablet (25 mg total) by mouth daily. 90 tablet 3  ? Multiple Vitamin (MULTIVITAMIN) tablet Take 1 tablet by mouth daily.    ? Probiotic Product (PROBIOTIC ADVANCED PO) Take 1 capsule by mouth daily.    ? simvastatin (ZOCOR) 40 MG tablet TAKE 1 TABLET BY MOUTH DAILY AT 6:00 PM 90 tablet 3  ? tamsulosin (FLOMAX) 0.4 MG CAPS capsule Take 1 capsule (0.4  mg total) by mouth daily. 90 capsule 2  ? finasteride (PROSCAR) 5 MG tablet Take 1 tablet (5 mg total) by mouth daily. (Patient not taking: Reported on 05/05/2021) 90 tablet 2  ? nitroGLYCERIN (NITROSTAT) 0.4 MG SL tablet Place 1 tablet (0.4 mg total) under the tongue every 5 (five) minutes as needed for chest pain. 25 tablet 3  ? ?Current Facility-Administered Medications  ?Medication Dose Route Frequency Provider Last Rate Last Admin  ? 0.9 %  sodium chloride infusion  500 mL Intravenous Continuous Gatha Mayer, MD      ? ? ?Allergies as of 05/05/2021 - Review Complete 05/05/2021  ?Allergen Reaction Noted  ? Penicillins Rash and Other (See Comments)   ? ? ?Family History  ?Problem Relation Age of Onset  ? Heart disease Mother   ?     CABG  ? Dementia Mother 3  ? Stroke Father   ? Kidney disease Father   ?     renal failure  ? AAA (abdominal aortic aneurysm) Father   ? Hypertension Sister   ? Diabetes Brother   ? AAA (abdominal aortic aneurysm) Paternal Uncle   ? Breast cancer Daughter   ? Colon cancer Neg Hx   ? ? ?Social History  ? ?Socioeconomic History  ? Marital status: Married  ?  Spouse name: Not on file  ? Number of children: 3  ? Years of education: 67  ? Highest education level: Not on file  ?Occupational History  ? Occupation: retired  ?Tobacco Use  ? Smoking status: Never  ? Smokeless tobacco: Never  ?Vaping Use  ? Vaping Use: Never used  ?Substance and Sexual Activity  ? Alcohol use: Yes  ?  Comment: wine once a week and beer once a week  ? Drug use: No  ? Sexual activity: Not on file  ?Other Topics Concern  ? Not on file  ?Social History Narrative  ? Lives with wife in a 2 story home.  Has 3 children.    ? 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   ? Education: college.   ? Occasional beer, no smoking, no drugs  ? ?Social Determinants of Health  ? ?Financial Resource Strain: Low Risk   ? Difficulty of Paying Living Expenses: Not hard at all  ?Food Insecurity: No Food Insecurity   ? Worried About Charity fundraiser in the Last Year: Never true  ? Ran Out of Food in the Last Year: Never true  ?Transportation Needs: No Transportation Needs  ? Lack of Transportation (Medical): No  ? Lack of Transportation (Non-Medical): No  ?Physical Activity: Sufficiently Active  ? Days of Exercise per  Week: 6 days  ? Minutes of Exercise per Session: 90 min  ?Stress: No Stress Concern Present  ? Feeling of Stress : Not at all  ?Social Connections: Not on file  ?Intimate Partner Violence: Not on file  ? ? ?Review of Systems: ? ?All other review of systems negative except as mentioned in the HPI. ? ?Physical Exam: ?Vital signs ?BP (!) 126/94   Pulse 70   Temp 98.1 ?F (36.7 ?C)   Ht 6' (1.829 m)   Wt 195 lb (88.5 kg)   BMI 26.45 kg/m?  ? ?General:   Alert,  Well-developed, well-nourished, pleasant and cooperative in NAD ?Lungs:  Clear throughout to auscultation.   ?Heart:  Regular rate and rhythm; no murmurs, clicks, rubs,  or gallops. ?Abdomen:  Soft, nontender and nondistended. Normal bowel sounds.   ?Neuro/Psych:  Alert and cooperative. Normal mood and affect. A and O x 3 ? ? ?'@Daniell Paradise'$  Simonne Maffucci, MD, [facility] ?[facility] ?[phone] (pager) ?05/05/2021 10:29 AM@ ? ?

## 2021-05-05 NOTE — Progress Notes (Signed)
Pt's states no medical or surgical changes since previsit or office visit. 

## 2021-05-05 NOTE — Progress Notes (Signed)
Called to room to assist during endoscopic procedure.  Patient ID and intended procedure confirmed with present staff. Received instructions for my participation in the procedure from the performing physician.  

## 2021-05-05 NOTE — Progress Notes (Signed)
Pt awake, report to RN, [facility]  °

## 2021-05-05 NOTE — Op Note (Signed)
[facility] ?Patient Name: Collin [name] ?Procedure Date: 05/05/2021 10:30 AM ?MRN: [id number] ?Endoscopist: Gatha Mayer , MD ?Age: 81 ?Referring MD:  ?Date of Birth: 1940-04-22 ?Gender: Male ?Account #: [id number] ?Procedure:                Colonoscopy ?Indications:              Surveillance: Personal history of adenomatous  ?                          polyps on last colonoscopy > 5 years ago, Last  ?                          colonoscopy: 2016 ?Medicines:                Monitored Anesthesia Care ?Procedure:                Pre-Anesthesia Assessment: ?                          - Prior to the procedure, a History and Physical  ?                          was performed, and patient medications and  ?                          allergies were reviewed. The patient's tolerance of  ?                          previous anesthesia was also reviewed. The risks  ?                          and benefits of the procedure and the sedation  ?                          options and risks were discussed with the patient.  ?                          All questions were answered, and informed consent  ?                          was obtained. Prior Anticoagulants: The patient has  ?                          taken no previous anticoagulant or antiplatelet  ?                          agents. ASA Grade Assessment: III - A patient with  ?                          severe systemic disease. After reviewing the risks  ?                          and benefits, the patient was deemed in  ?  satisfactory condition to undergo the procedure. ?                          After obtaining informed consent, the colonoscope  ?                          was passed under direct vision. Throughout the  ?                          procedure, the patient's blood pressure, pulse, and  ?                          oxygen saturations were monitored continuously. The  ?                          CF [id number] #[id number] was introduced through the  anus  ?                          and advanced to the the cecum, identified by  ?                          appendiceal orifice and ileocecal valve. The  ?                          colonoscopy was performed without difficulty. The  ?                          patient tolerated the procedure well. The quality  ?                          of the bowel preparation was adequate. The  ?                          ileocecal valve, appendiceal orifice, and rectum  ?                          were photographed. ?Scope In: 10:39:04 AM ?Scope Out: 10:59:53 AM ?Scope Withdrawal Time: 0 hours 15 minutes 13 seconds  ?Total Procedure Duration: 0 hours 20 minutes 49 seconds  ?Findings:                 The digital rectal exam findings include enlarged  ?                          prostate. Pertinent negatives include no palpable  ?                          rectal lesions. ?                          A 2 mm polyp was found in the cecum. The polyp was  ?                          sessile. The polyp was removed with a cold biopsy  ?  forceps. Resection and retrieval were complete.  ?                          Verification of patient identification for the  ?                          specimen was done. Estimated blood loss was minimal. ?                          Scattered diverticula were found in the entire  ?                          colon. ?                          Internal hemorrhoids were found. ?                          The exam was otherwise without abnormality on  ?                          direct and retroflexion views. ?Complications:            No immediate complications. ?Estimated Blood Loss:     Estimated blood loss was minimal. ?Impression:               - Enlarged prostate found on digital rectal exam. ?                          - One 2 mm polyp in the cecum, removed with a cold  ?                          biopsy forceps. Resected and retrieved. ?                          - Diverticulosis in the entire  examined colon. ?                          - Internal hemorrhoids. ?                          - The examination was otherwise normal on direct  ?                          and retroflexion views. ?                          - Personal history of colonic polyps. 4 adenomas  ?                          2016 ?Recommendation:           - Patient has a contact number available for  ?                          emergencies. The signs and symptoms of potential  ?  delayed complications were discussed with the  ?                          patient. Return to normal activities tomorrow.  ?                          Written discharge instructions were provided to the  ?                          patient. ?                          - Resume previous diet. ?                          - Continue present medications. ?                          - Await pathology results. ?                          - No repeat colonoscopy due to age. ?Gatha Mayer, MD ?05/05/2021 11:06:09 AM ?This report has been signed electronically. ?

## 2021-05-07 ENCOUNTER — Telehealth: Payer: Self-pay

## 2021-05-07 NOTE — Telephone Encounter (Signed)
?  Follow up Call- ? ? ?  05/05/2021  ?  9:44 AM  ?Call back number  ?Post procedure Call Back phone  # [phone]  ?Permission to leave phone message Yes  ?  ? ?Patient questions: ? ?Do you have a fever, pain , or abdominal swelling? No. ?Pain Score  0 * ? ?Have you tolerated food without any problems? Yes.   ? ?Have you been able to return to your normal activities? Yes.   ? ?Do you have any questions about your discharge instructions: ?Diet   No. ?Medications  No. ?Follow up visit  No. ? ?Do you have questions or concerns about your Care? No. ? ?Actions: ?* If pain score is 4 or above: ?No action needed, pain <4. ? ?Have you developed a fever since your procedure? no ? ?2.   Have you had an respiratory symptoms (SOB or cough) since your procedure? no ? ?3.   Have you tested positive for COVID 19 since your procedure no ? ?4.   Have you had any family members/close contacts diagnosed with the COVID 19 since your procedure?  no ? ? ?If yes to any of these questions please route to Joylene John, RN and Joella Prince, RN  ? ? ?

## 2021-05-13 ENCOUNTER — Encounter: Payer: Self-pay | Admitting: Internal Medicine

## 2021-06-01 ENCOUNTER — Other Ambulatory Visit: Payer: Self-pay | Admitting: Cardiovascular Disease

## 2021-06-01 DIAGNOSIS — I2510 Atherosclerotic heart disease of native coronary artery without angina pectoris: Secondary | ICD-10-CM

## 2021-07-10 ENCOUNTER — Ambulatory Visit: Payer: PPO | Admitting: Family Medicine

## 2021-07-14 NOTE — Progress Notes (Unsigned)
HPI:  [name] is a 81 y.o. male, who is here today to follow on recent visit.    07/15/2021   11:03 AM 03/03/2021    4:05 PM 12/10/2020    7:54 PM 01/03/2020    3:46 PM 11/11/2019   12:08 PM  Depression screen PHQ 2/9  Decreased Interest 0 0 0 0 0  Down, Depressed, Hopeless 0 0 0 0 0  PHQ - 2 Score 0 0 0 0 0  Altered sleeping    0   Tired, decreased energy    0   Change in appetite    0   Feeling bad or failure about yourself     0   Trouble concentrating    0   Moving slowly or fidgety/restless    0   Suicidal thoughts    0   PHQ-9 Score    0   Difficult doing work/chores    Not difficult at all    Trouble finding words sometimes. Having difficulty with numbers. Check book mistakes more frequent.  Balance not getting worse. He takes vit D supplementation. *** Lab Results  Component Value Date   VITAMINB12 601 01/26/2017   Lab Results  Component Value Date   TSH 1.98 06/04/2019   Lab Results  Component Value Date   HGBA1C 6.3 12/03/2020     *** Review of Systems Rest see pertinent positives and negatives per HPI.  Current Outpatient Medications on File Prior to Visit  Medication Sig Dispense Refill   aspirin 81 MG tablet Take 1 tablet (81 mg total) by mouth at bedtime. 30 tablet    Cholecalciferol (VITAMIN D3) 125 MCG (5000 UT) CAPS Take 5,000 Units by mouth at bedtime.     Coenzyme Q10 300 MG CAPS Take 300 mg by mouth daily.     Cyanocobalamin (VITAMIN B-12) 2500 MCG SUBL Place 1 tablet under the tongue daily.     fish oil-omega-3 fatty acids 1000 MG capsule Take 1 g by mouth daily.     gabapentin (NEURONTIN) 100 MG capsule TAKE 1 CAPSULE(100 MG) BY MOUTH TWICE DAILY 180 capsule 1   Glucosamine-Chondroitin (MOVE FREE PO) Take 1 tablet by mouth daily.     metoprolol succinate (TOPROL-XL) 25 MG 24 hr tablet TAKE 1 TABLET BY MOUTH DAILY 90 tablet 1   Multiple Vitamin (MULTIVITAMIN) tablet Take 1 tablet by mouth daily.     nitroGLYCERIN (NITROSTAT)  0.4 MG SL tablet Place 1 tablet (0.4 mg total) under the tongue every 5 (five) minutes as needed for chest pain. 25 tablet 3   Probiotic Product (PROBIOTIC ADVANCED PO) Take 1 capsule by mouth daily.     simvastatin (ZOCOR) 40 MG tablet TAKE 1 TABLET BY MOUTH DAILY AT 6:00 PM 90 tablet 3   tamsulosin (FLOMAX) 0.4 MG CAPS capsule Take 1 capsule (0.4 mg total) by mouth daily. 90 capsule 2   No current facility-administered medications on file prior to visit.   Past Medical History:  Diagnosis Date   Allergy    Arthritis    BPH (benign prostatic hyperplasia)    CAD (coronary artery disease)    Cataract    Diverticulosis of colon    Hx of adenomatous colonic polyps 05/22/2014   Hyperlipidemia    Hypertension    MYOCARDIAL INFARCTION, HX OF 07/20/2006   Qualifier: Diagnosis of  By: Leanne Chang MD, Bruce     Allergies  Allergen Reactions   Penicillins Rash and Other (See Comments)  PATIENT HAS HAD A PCN REACTION WITH IMMEDIATE RASH, FACIAL/TONGUE/THROAT SWELLING, SOB, OR LIGHTHEADEDNESS WITH HYPOTENSION:  #  #  YES  #  #  Has patient had a PCN reaction causing severe rash involving mucus membranes or skin necrosis: No Has patient had a PCN reaction that required hospitalization: No Has patient had a PCN reaction occurring within the last 10 years: No If all of the above answers are "NO", then may proceed with Cephalosporin use.    Social History   Socioeconomic History   Marital status: Married    Spouse name: Not on file   Number of children: 3   Years of education: 16   Highest education level: Not on file  Occupational History   Occupation: retired  Tobacco Use   Smoking status: Never   Smokeless tobacco: Never  Vaping Use   Vaping Use: Never used  Substance and Sexual Activity   Alcohol use: Yes    Comment: wine once a week and beer once a week   Drug use: No   Sexual activity: Not on file  Other Topics Concern   Not on file  Social History Narrative   Lives with wife  in a 2 story home.  Has 3 children.     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    Education: college.    Occasional beer, no smoking, no drugs   Social Determinants of Health   Financial Resource Strain: Low Risk  (03/03/2021)   Overall Financial Resource Strain (CARDIA)    Difficulty of Paying Living Expenses: Not hard at all  Food Insecurity: No Food Insecurity (03/03/2021)   Hunger Vital Sign    Worried About Running Out of Food in the Last Year: Never true    Ran Out of Food in the Last Year: Never true  Transportation Needs: No Transportation Needs (03/03/2021)   PRAPARE - Hydrologist (Medical): No    Lack of Transportation (Non-Medical): No  Physical Activity: Sufficiently Active (03/03/2021)   Exercise Vital Sign    Days of Exercise per Week: 6 days    Minutes of Exercise per Session: 90 min  Stress: No Stress Concern Present (03/03/2021)   [place]    Feeling of Stress : Not at all  Social Connections: Moderately Integrated (01/03/2020)   Social Connection and Isolation Panel [NHANES]    Frequency of Communication with Friends and Family: More than three times a week    Frequency of Social Gatherings with Friends and Family: Three times a week    Attends Religious Services: More than 4 times per year    Active Member of Clubs or Organizations: No    Attends Archivist Meetings: Never    Marital Status: Married    Vitals:   07/15/21 1056  BP: 128/80  Pulse: 65  SpO2: 97%   Body mass index is 26.5 kg/m.  Physical Exam  ASSESSMENT AND PLAN:   There are no diagnoses linked to this encounter.  No orders of the defined types were placed in this encounter.   No problem-specific Assessment & Plan notes found for this encounter. Decline PT, not interested in neurppsych ***  No follow-ups on file.   Betty G. Martinique, MD  [facility]. [place] office.

## 2021-07-15 ENCOUNTER — Ambulatory Visit (INDEPENDENT_AMBULATORY_CARE_PROVIDER_SITE_OTHER): Payer: PPO | Admitting: Family Medicine

## 2021-07-15 ENCOUNTER — Other Ambulatory Visit (INDEPENDENT_AMBULATORY_CARE_PROVIDER_SITE_OTHER): Payer: PPO

## 2021-07-15 ENCOUNTER — Encounter: Payer: Self-pay | Admitting: Family Medicine

## 2021-07-15 VITALS — BP 128/80 | HR 65 | Ht 72.0 in | Wt 195.4 lb

## 2021-07-15 DIAGNOSIS — R7303 Prediabetes: Secondary | ICD-10-CM

## 2021-07-15 DIAGNOSIS — N401 Enlarged prostate with lower urinary tract symptoms: Secondary | ICD-10-CM | POA: Diagnosis not present

## 2021-07-15 DIAGNOSIS — R251 Tremor, unspecified: Secondary | ICD-10-CM

## 2021-07-15 DIAGNOSIS — M25511 Pain in right shoulder: Secondary | ICD-10-CM | POA: Diagnosis not present

## 2021-07-15 DIAGNOSIS — I25119 Atherosclerotic heart disease of native coronary artery with unspecified angina pectoris: Secondary | ICD-10-CM | POA: Diagnosis not present

## 2021-07-15 DIAGNOSIS — G3184 Mild cognitive impairment, so stated: Secondary | ICD-10-CM

## 2021-07-15 DIAGNOSIS — R944 Abnormal results of kidney function studies: Secondary | ICD-10-CM | POA: Diagnosis not present

## 2021-07-15 DIAGNOSIS — G319 Degenerative disease of nervous system, unspecified: Secondary | ICD-10-CM | POA: Diagnosis not present

## 2021-07-15 DIAGNOSIS — G609 Hereditary and idiopathic neuropathy, unspecified: Secondary | ICD-10-CM

## 2021-07-15 DIAGNOSIS — R351 Nocturia: Secondary | ICD-10-CM | POA: Diagnosis not present

## 2021-07-15 LAB — BASIC METABOLIC PANEL
BUN: 17 mg/dL (ref 6–23)
CO2: 27 mEq/L (ref 19–32)
Calcium: 9.2 mg/dL (ref 8.4–10.5)
Chloride: 102 mEq/L (ref 96–112)
Creatinine, Ser: 1.32 mg/dL (ref 0.40–1.50)
GFR: 50.05 mL/min — ABNORMAL LOW (ref >60.00)
Glucose, Bld: 145 mg/dL — ABNORMAL HIGH (ref 70–99)
Potassium: 4.3 mEq/L (ref 3.5–5.1)
Sodium: 136 mEq/L (ref 135–145)

## 2021-07-15 LAB — TSH: TSH: 2 u[IU]/mL (ref 0.35–5.50)

## 2021-07-15 LAB — VITAMIN B12: Vitamin B-12: 983 pg/mL — ABNORMAL HIGH (ref 211–911)

## 2021-07-15 LAB — HEMOGLOBIN A1C: Hgb A1c MFr Bld: 6.3 % (ref 4.6–6.5)

## 2021-07-15 NOTE — Patient Instructions (Addendum)
A few things to remember from today's visit:  Right shoulder pain, unspecified chronicity  Tremor of both hands - Plan: Ambulatory referral to Neurology, Basic metabolic panel, Vitamin S28, TSH  Prediabetes - Plan: Hemoglobin A1c  MCI (mild cognitive impairment) - Plan: Ambulatory referral to Neurology, Vitamin B12  Idiopathic peripheral neuropathy  Abnormal renal function test  If you need refills please call your pharmacy. Do not use My Chart to request refills or for acute issues that need immediate attention.   No changes in medications today. If shoulder pain doe snot resolve, please let me know. Neuro referral placed to talk about tremor and memory difficulties.  Please be sure medication list is accurate. If a new problem present, please set up appointment sooner than planned today.

## 2021-07-24 ENCOUNTER — Encounter: Payer: Self-pay | Admitting: Neurology

## 2021-08-03 ENCOUNTER — Other Ambulatory Visit: Payer: Self-pay | Admitting: Family Medicine

## 2021-08-03 DIAGNOSIS — G609 Hereditary and idiopathic neuropathy, unspecified: Secondary | ICD-10-CM

## 2021-08-03 DIAGNOSIS — M21372 Foot drop, left foot: Secondary | ICD-10-CM

## 2021-08-03 MED ORDER — GABAPENTIN 100 MG PO CAPS: ORAL_CAPSULE | ORAL | 1 refills | Status: DC

## 2021-08-03 NOTE — Addendum Note (Signed)
Addended by: Rodrigo Ran on: 08/03/2021 09:45 AM   Modules accepted: Orders

## 2021-08-04 ENCOUNTER — Telehealth: Payer: PPO

## 2021-08-14 ENCOUNTER — Telehealth: Payer: Self-pay | Admitting: Pharmacist

## 2021-08-14 NOTE — Chronic Care Management (AMB) (Signed)
    Chronic Care Management Pharmacy Assistant   Name: Ladarious Kresse Yorke  MRN: [id number] DOB: 05/31/1940  Reason for Encounter: Reschedule follow up call with Jeni Salles due to training conflict  Recent office visits:  07/15/21 Martinique, Betty G, MD - Patient presented for Right shoulder pain unspecified chronicity and other concerns.Stopped Finasteride.  03/03/21 Kellie Simmering, LPN - Patient presented for Medicare Annual Wellness exam. No medication changes.  Recent consult visits:  03/23/21 Sherren Mocha, MD (Cardiology) - Patient presented for CAD involving native artery of native heart without angina pectoris and other concerns. Changed Metoprolol.   03/11/21 Willia Craze, NP [facility]) - Patient presented for History of colonic Polyps. Changed Cyanocobalamin.  Hospital visits:  None in previous 6 months  Medications: Outpatient Encounter Medications as of 08/14/2021  Medication Sig   aspirin 81 MG tablet Take 1 tablet (81 mg total) by mouth at bedtime.   Cholecalciferol (VITAMIN D3) 125 MCG (5000 UT) CAPS Take 5,000 Units by mouth at bedtime.   Coenzyme Q10 300 MG CAPS Take 300 mg by mouth daily.   Cyanocobalamin (VITAMIN B-12) 2500 MCG SUBL Place 1 tablet under the tongue daily.   fish oil-omega-3 fatty acids 1000 MG capsule Take 1 g by mouth daily.   gabapentin (NEURONTIN) 100 MG capsule TAKE 1 CAPSULE(100 MG) BY MOUTH TWICE DAILY   Glucosamine-Chondroitin (MOVE FREE PO) Take 1 tablet by mouth daily.   metoprolol succinate (TOPROL-XL) 25 MG 24 hr tablet TAKE 1 TABLET BY MOUTH DAILY   Multiple Vitamin (MULTIVITAMIN) tablet Take 1 tablet by mouth daily.   nitroGLYCERIN (NITROSTAT) 0.4 MG SL tablet Place 1 tablet (0.4 mg total) under the tongue every 5 (five) minutes as needed for chest pain.   Probiotic Product (PROBIOTIC ADVANCED PO) Take 1 capsule by mouth daily.   simvastatin (ZOCOR) 40 MG tablet TAKE 1 TABLET BY MOUTH DAILY AT 6:00 PM   tamsulosin (FLOMAX) 0.4 MG CAPS  capsule Take 1 capsule (0.4 mg total) by mouth daily.   No facility-administered encounter medications on file as of 08/14/2021.   Notes: Call to patient to reschedule phone encounter with Jeni Salles due to scheduling conflict, patient in agreement and aware of the change.  Care Gaps: COVID Booster - Overdue CCM-  9/23 AWV- 2/23  Star Rating Drugs: Simvastatin '40mg'$  - Last filled 06/09/21 90 DS at [facility] Pharmacist Assistant [phone]

## 2021-08-21 DIAGNOSIS — L814 Other melanin hyperpigmentation: Secondary | ICD-10-CM | POA: Diagnosis not present

## 2021-08-21 DIAGNOSIS — L578 Other skin changes due to chronic exposure to nonionizing radiation: Secondary | ICD-10-CM | POA: Diagnosis not present

## 2021-08-21 DIAGNOSIS — L570 Actinic keratosis: Secondary | ICD-10-CM | POA: Diagnosis not present

## 2021-08-21 DIAGNOSIS — L821 Other seborrheic keratosis: Secondary | ICD-10-CM | POA: Diagnosis not present

## 2021-08-21 DIAGNOSIS — Z85828 Personal history of other malignant neoplasm of skin: Secondary | ICD-10-CM | POA: Diagnosis not present

## 2021-08-21 DIAGNOSIS — D225 Melanocytic nevi of trunk: Secondary | ICD-10-CM | POA: Diagnosis not present

## 2021-08-31 NOTE — Progress Notes (Unsigned)
Assessment/Plan:   ***2.  Chronic left foot drop due to L5 radiculopathy  -MRI back in 2019 with severe neuroforaminal stenosis at L4-L5.  Patient apparently saw neurosurgery and surgery was not recommended.  3.  Idiopathic peripheral neuropathy  -This was identified several years ago and EMG  -This, along with the left foot drop and degenerative changes on MRI all the way back in 2019 are likely the source of gait instability.  Subjective:   Collin [name] was seen today in the movement disorders clinic for neurologic consultation at the request of Martinique, Malka So, MD.  The consultation is for the evaluation of tremor memory change and unsteady gait.  Medical records made available to me are reviewed.  Patient has previously seen Dr. Posey Pronto, but that was all the way back in 2019.  This was for idiopathic peripheral neuropathy as well as foot drop, due to L5 radiculopathy (both identified on EMG and severe neuroforaminal stenosis at L4-L5 noted on MRI).  After that visit, neurosurgery evaluation was recommended and he apparently saw neurosurgery, but surgery was not recommended.  When he saw his primary care physician in June, 2023, he was complaining about chronic memory issues (noted about 4 years ago).  He wanted to see if he could do anything to prevent it from getting worse.  In addition, primary care physician noted right hand rest tremor and bilateral intention tremor.  He is sent to rule out Parkinson's.  Tremor: {yes no:314532}   How long has it been going on? ***  At rest or with activation?  ***  When is it noted the most?  ***  Fam hx of tremor?  {yes IR:443154}  Located where?  ***  Affected by caffeine:  {yes no:314532}  Affected by alcohol:  {yes no:314532}  Affected by stress:  {yes no:314532}  Affected by fatigue:  {yes no:314532}  Spills soup if on spoon:  {yes no:314532}  Spills glass of liquid if full:  {yes no:314532}  Affects ADL's (tying shoes, brushing teeth,  etc):  {yes no:314532}  Tremor inducing meds:  {yes no:314532}  Other Specific Symptoms:  Voice: *** Sleep: ***  Vivid Dreams:  {yes no:314532}  Acting out dreams:  {yes no:314532} Wet Pillows: {yes no:314532} Postural symptoms:  {yes no:314532}  Falls?  {yes no:314532} Bradykinesia symptoms: {parkinson brady:18041} Loss of smell:  {yes no:314532} Loss of taste:  {yes no:314532} Urinary Incontinence:  {yes no:314532} Difficulty Swallowing:  {yes no:314532} Handwriting, micrographia: {yes no:314532} Trouble with ADL's:  {yes no:314532}  Trouble buttoning clothing: {yes no:314532} Depression:  {yes no:314532} Memory changes:  {yes no:314532} Hallucinations:  {yes no:314532}  visual distortions: {yes no:314532} N/V:  {yes no:314532} Lightheaded:  {yes no:314532}  Syncope: {yes no:314532} Diplopia:  {yes no:314532} Dyskinesia:  {yes no:314532}  Neuroimaging of the brain has *** previously been performed.  It *** available for my review today.  PREVIOUS MEDICATIONS: {Parkinson's RX:18200}  ALLERGIES:   Allergies  Allergen Reactions   Penicillins Rash and Other (See Comments)    PATIENT HAS HAD A PCN REACTION WITH IMMEDIATE RASH, FACIAL/TONGUE/THROAT SWELLING, SOB, OR LIGHTHEADEDNESS WITH HYPOTENSION:  #  #  YES  #  #  Has patient had a PCN reaction causing severe rash involving mucus membranes or skin necrosis: No Has patient had a PCN reaction that required hospitalization: No Has patient had a PCN reaction occurring within the last 10 years: No If all of the above answers are "NO", then may proceed with Cephalosporin use.  CURRENT MEDICATIONS:  Current Outpatient Medications  Medication Instructions   aspirin 81 mg, Oral, Daily at bedtime   Coenzyme Q10 300 mg, Oral, Daily   Cyanocobalamin (VITAMIN B-12) 2500 MCG SUBL 1 tablet, Sublingual, Daily   fish oil-omega-3 fatty acids 1 g, Oral, Daily,     gabapentin (NEURONTIN) 100 MG capsule TAKE 1 CAPSULE(100 MG) BY MOUTH  TWICE DAILY   Glucosamine-Chondroitin (MOVE FREE PO) 1 tablet, Oral, Daily,     metoprolol succinate (TOPROL-XL) 25 MG 24 hr tablet TAKE 1 TABLET BY MOUTH DAILY   Multiple Vitamin (MULTIVITAMIN) tablet 1 tablet, Oral, Daily,     nitroGLYCERIN (NITROSTAT) 0.4 mg, Sublingual, Every 5 min PRN   Probiotic Product (PROBIOTIC ADVANCED PO) 1 capsule, Oral, Daily   simvastatin (ZOCOR) 40 MG tablet TAKE 1 TABLET BY MOUTH DAILY AT 6:00 PM   tamsulosin (FLOMAX) 0.4 mg, Oral, Daily   Vitamin D3 5,000 Units, Oral, Daily at bedtime    Objective:   PHYSICAL EXAMINATION:    VITALS:  There were no vitals filed for this visit.  GEN:  The patient appears stated age and is in NAD. HEENT:  Normocephalic, atraumatic.  The mucous membranes are moist. The superficial temporal arteries are without ropiness or tenderness. CV:  RRR Lungs:  CTAB Neck/HEME:  There are no carotid bruits bilaterally.  Neurological examination:  Orientation: The patient is alert and oriented x3.  Cranial nerves: There is good facial symmetry.  Extraocular muscles are intact. The visual fields are full to confrontational testing. The speech is fluent and clear. Soft palate rises symmetrically and there is no tongue deviation. Hearing is intact to conversational tone. Sensation: Sensation is intact to light touch throughout (facial, trunk, extremities). Vibration is decreased distally. There is no extinction with double simultaneous stimulation.  Motor: Strength is 5/5 in the bilateral upper and lower extremities.   Shoulder shrug is equal and symmetric.  There is no pronator drift. Deep tendon reflexes: Deep tendon reflexes are 2/4 at the bilateral biceps, triceps, brachioradialis, patella and achilles. Plantar responses are downgoing bilaterally.  Movement examination: Tone: There is ***tone in the bilateral upper extremities.  The tone in the lower extremities is ***.  Abnormal movements: *** Coordination:  There is ***  decremation with RAM's, *** Gait and Station: The patient has *** difficulty arising out of a deep-seated chair without the use of the hands. The patient's stride length is ***.  There is slight left foot drop with ambulation.  The patient has a *** pull test.     I have reviewed and interpreted the following labs independently   Chemistry      Component Value Date/Time   NA 136 07/15/2021 1416   NA 138 01/16/2020 0922   K 4.3 07/15/2021 1416   CL 102 07/15/2021 1416   CO2 27 07/15/2021 1416   BUN 17 07/15/2021 1416   BUN 19 01/16/2020 0922   CREATININE 1.32 07/15/2021 1416      Component Value Date/Time   CALCIUM 9.2 07/15/2021 1416   ALKPHOS 39 12/03/2020 0837   AST 31 12/03/2020 0837   ALT 24 12/03/2020 0837   BILITOT 0.9 12/03/2020 0837      Lab Results  Component Value Date   TSH 2.00 07/15/2021   Lab Results  Component Value Date   WBC 8.6 01/01/2020   HGB 15.1 01/01/2020   HCT 44.7 01/01/2020   MCV 87 01/01/2020   PLT 200 01/01/2020   Lab Results  Component Value Date  VITAMINB12 983 (H) 07/15/2021      Total time spent on today's visit was ***greater than 60 minutes, including both face-to-face time and nonface-to-face time.  Time included that spent on review of records (prior notes available to me/labs/imaging if pertinent), discussing treatment and goals, answering patient's questions and coordinating care.  Cc:  Martinique, Betty G, MD

## 2021-09-01 ENCOUNTER — Encounter: Payer: Self-pay | Admitting: Neurology

## 2021-09-01 ENCOUNTER — Ambulatory Visit: Payer: PPO | Admitting: Neurology

## 2021-09-01 VITALS — BP 130/64 | Ht 73.0 in | Wt 196.0 lb

## 2021-09-01 DIAGNOSIS — R202 Paresthesia of skin: Secondary | ICD-10-CM | POA: Diagnosis not present

## 2021-09-01 DIAGNOSIS — G609 Hereditary and idiopathic neuropathy, unspecified: Secondary | ICD-10-CM

## 2021-09-01 DIAGNOSIS — M21372 Foot drop, left foot: Secondary | ICD-10-CM

## 2021-09-01 DIAGNOSIS — R413 Other amnesia: Secondary | ICD-10-CM

## 2021-09-01 NOTE — Patient Instructions (Addendum)
We discussed:  Cognitive testing and you wanted to hold on that for now.  I don't recommend Prevagen The only thing that will help balance associated with neuropathy is physical therapy You don't have Parkinsons Disease.  We will monitor for this in the future.  Let us know if you have worsening or new symptoms The physicians and staff at [facility] are committed to providing excellent care. You may receive a survey requesting feedback about your experience at our office. We strive to receive "very good" responses to the survey questions. If you feel that your experience would prevent you from giving the office a "very good " response, please contact our office to try to remedy the situation. We may be reached at [phone]. Thank you for taking the time out of your busy day to complete the survey.

## 2021-09-02 ENCOUNTER — Other Ambulatory Visit: Payer: Self-pay | Admitting: Family Medicine

## 2021-09-02 DIAGNOSIS — N401 Enlarged prostate with lower urinary tract symptoms: Secondary | ICD-10-CM

## 2021-09-03 ENCOUNTER — Encounter: Payer: Self-pay | Admitting: Neurology

## 2021-09-08 DIAGNOSIS — H40013 Open angle with borderline findings, low risk, bilateral: Secondary | ICD-10-CM | POA: Diagnosis not present

## 2021-09-08 DIAGNOSIS — Z961 Presence of intraocular lens: Secondary | ICD-10-CM | POA: Diagnosis not present

## 2021-09-22 ENCOUNTER — Ambulatory Visit: Payer: PPO | Admitting: Neurology

## 2021-10-06 ENCOUNTER — Telehealth: Payer: PPO

## 2021-10-15 ENCOUNTER — Telehealth: Payer: Self-pay | Admitting: Pharmacist

## 2021-10-15 NOTE — Chronic Care Management (AMB) (Signed)
    Chronic Care Management Pharmacy Assistant   Name: Devario Bucklew Chismar  MRN: [id number] DOB: 08/21/1940  10/15/21 APPOINTMENT REMINDER   Called Patient No answer, left message of appointment on 10/15/21 at 3 via telephone visit with Jeni Salles, Pharm D.   Notified to have all medications, supplements, blood pressure and/or blood sugar logs available during appointment and to return call if need to reschedule.    Care Gaps: COVID Booster - Overdue Flu Vaccine - Overdue AWV- 2/23   Star Rating Drug: Simvastatin '40mg'$  - Last filled 09/08/21 90 DS at [facility]  Any gaps in medications fill history?  none     Medications: Outpatient Encounter Medications as of 10/15/2021  Medication Sig Note   aspirin 81 MG tablet Take 1 tablet (81 mg total) by mouth at bedtime.    Cholecalciferol (VITAMIN D3) 125 MCG (5000 UT) CAPS Take 5,000 Units by mouth at bedtime.    Coenzyme Q10 300 MG CAPS Take 300 mg by mouth daily.    Cyanocobalamin (VITAMIN B-12) 2500 MCG SUBL Place 1 tablet under the tongue daily.    fish oil-omega-3 fatty acids 1000 MG capsule Take 1 g by mouth daily.    gabapentin (NEURONTIN) 100 MG capsule TAKE 1 CAPSULE(100 MG) BY MOUTH TWICE DAILY 09/01/2021: Once a day   Glucosamine-Chondroitin (MOVE FREE PO) Take 1 tablet by mouth daily.    metoprolol succinate (TOPROL-XL) 25 MG 24 hr tablet TAKE 1 TABLET BY MOUTH DAILY    Multiple Vitamin (MULTIVITAMIN) tablet Take 1 tablet by mouth daily.    nitroGLYCERIN (NITROSTAT) 0.4 MG SL tablet Place 1 tablet (0.4 mg total) under the tongue every 5 (five) minutes as needed for chest pain.    Probiotic Product (PROBIOTIC ADVANCED PO) Take 1 capsule by mouth daily.    simvastatin (ZOCOR) 40 MG tablet TAKE 1 TABLET BY MOUTH DAILY AT 6:00 PM    tamsulosin (FLOMAX) 0.4 MG CAPS capsule TAKE 1 CAPSULE(0.4 MG) BY MOUTH DAILY    No facility-administered encounter medications on file as of 10/15/2021.      [place] Clinical  Pharmacist Assistant [phone]

## 2021-10-15 NOTE — Progress Notes (Signed)
Chronic Care Management Pharmacy Note  10/16/2021 Name:  Collin [name] MRN:  [id number] DOB:  [birth year]  Summary: Pt does not check BP at home   Recommendations/Changes made from today's visit: -Recommended checking BP and pulse at home weekly  -Requested orders for lab work prior to CPE per patient request   Plan: Scheduled CPE for December BP assessment in 2-3 months   Subjective: Collin [name] is an 81 y.o. year old male who is a primary patient of Martinique, Malka So, MD.  The CCM team was consulted for assistance with disease management and care coordination needs.    Engaged with patient by telephone for follow up visit in response to provider referral for pharmacy case management and/or care coordination services.   Consent to Services:  The patient was given information about Chronic Care Management services, agreed to services, and gave verbal consent prior to initiation of services.  Please see initial visit note for detailed documentation.   Patient Care Team: Martinique, Betty G, MD as PCP - General (Family Medicine) Sherren Mocha, MD as PCP - Cardiology (Cardiology) Viona Gilmore, [facility] as Pharmacist (Pharmacist)  Recent office visits: 07/15/21 Martinique, Betty G, MD - Patient presented for Right shoulder pain unspecified chronicity and other concerns.Stopped Finasteride.  Recent consult visits: 09/01/21 Alonza Bogus, DO (neurology): Patient presented for neuropathy and tremor assessment. Ruled out Parkinson's disease. Follow up as needed.  03/23/21 Sherren Mocha, MD (Cardiology) - Patient presented for CAD involving native artery of native heart without angina pectoris and other concerns. Changed Metoprolol.    03/11/21 Willia Craze, NP Gertie Fey) - Patient presented for History of colonic Polyps. Changed Cyanocobalamin.  Hospital visits: None in previous 6 months   Objective:  Lab Results  Component Value Date   CREATININE 1.32 07/15/2021   BUN 17  07/15/2021   GFR 50.05 (L) 07/15/2021   GFRNONAA 59 (L) 01/16/2020   GFRAA 68 01/16/2020   NA 136 07/15/2021   K 4.3 07/15/2021   CALCIUM 9.2 07/15/2021   CO2 27 07/15/2021   GLUCOSE 145 (H) 07/15/2021    Lab Results  Component Value Date/Time   HGBA1C 6.3 07/15/2021 02:16 PM   HGBA1C 6.3 12/03/2020 08:37 AM   GFR 50.05 (L) 07/15/2021 02:16 PM   GFR 58.69 (L) 12/03/2020 08:37 AM    Last diabetic Eye exam: No results found for: "HMDIABEYEEXA"  Last diabetic Foot exam: No results found for: "HMDIABFOOTEX"   Lab Results  Component Value Date   CHOL 122 12/03/2020   HDL 61.10 12/03/2020   LDLCALC 52 12/03/2020   TRIG 47.0 12/03/2020   CHOLHDL 2 12/03/2020       Latest Ref Rng & Units 12/03/2020    8:37 AM 11/05/2019    8:28 AM 06/04/2019   10:09 AM  Hepatic Function  Total Protein 6.0 - 8.3 g/dL 6.8  7.0  7.2   Albumin 3.5 - 5.2 g/dL 4.1   4.4   AST 0 - 37 U/L 31  32  38   ALT 0 - 53 U/L _0 Alk Phosphatase 39 - 117 U/L 39   50   Total Bilirubin 0.2 - 1.2 mg/dL 0.9  0.9  0.7   Bilirubin, Direct 0.0 - 0.2 mg/dL  0.2      Lab Results  Component Value Date/Time   TSH 2.00 07/15/2021 02:16 PM   TSH 1.98 06/04/2019 10:09 AM       Latest Ref Rng &  Units 01/01/2020    3:31 PM 06/04/2019   10:09 AM 01/09/2018   11:38 AM  CBC  WBC 3.4 - 10.8 x10E3/uL 8.6  6.6  7.9   Hemoglobin 13.0 - 17.7 g/dL 15.1  15.3  15.4   Hematocrit 37.5 - 51.0 % 44.7  45.9  48.1   Platelets 150 - 450 x10E3/uL 200  201.0  197     Lab Results  Component Value Date/Time   VD25OH 92.88 06/04/2019 10:09 AM   VD25OH 60 07/24/2008 09:28 PM    Clinical ASCVD: Yes  The ASCVD Risk score (Arnett DK, et al., 2019) failed to calculate for the following reasons:   The 2019 ASCVD risk score is only valid for ages 54 to 72   The patient has a prior MI or stroke diagnosis       07/15/2021   11:03 AM 03/03/2021    4:05 PM 12/10/2020    7:54 PM  Depression screen PHQ 2/9  Decreased Interest 0  0 0  Down, Depressed, Hopeless 0 0 0  PHQ - 2 Score 0 0 0      Social History   Tobacco Use  Smoking Status Never  Smokeless Tobacco Never   BP Readings from Last 3 Encounters:  09/01/21 130/64  07/15/21 128/80  05/05/21 121/72   Pulse Readings from Last 3 Encounters:  07/15/21 65  05/05/21 (!) 48  03/23/21 66   Wt Readings from Last 3 Encounters:  09/01/21 196 lb (88.9 kg)  07/15/21 195 lb 6 oz (88.6 kg)  05/05/21 195 lb (88.5 kg)   BMI Readings from Last 3 Encounters:  09/01/21 25.86 kg/m  07/15/21 26.50 kg/m  05/05/21 26.45 kg/m    Assessment/Interventions: Review of patient past medical history, allergies, medications, health status, including review of consultants reports, laboratory and other test data, was performed as part of comprehensive evaluation and provision of chronic care management services.   SDOH:  (Social Determinants of Health) assessments and interventions performed: Yes SDOH Interventions    Flowsheet Row Chronic Care Management from 10/16/2021 in [place] [facility] [place] Management from 01/15/2020 in [place] [facility] [place] from 01/03/2020 in [place] [facility] [place] Interventions -- -- Intervention Not Indicated  Housing Interventions -- -- Intervention Not Indicated  Transportation Interventions -- Intervention Not Indicated Intervention Not Indicated  Depression Interventions/Treatment  -- -- PHQ2-9 Score <4 Follow-up Not Indicated  Financial Strain Interventions Intervention Not Indicated Intervention Not Indicated Intervention Not Indicated  Physical Activity Interventions -- -- Intervention Not Indicated  Stress Interventions -- -- Intervention Not Indicated  Social Connections Interventions -- -- Intervention Not Indicated      SDOH Screenings   Food Insecurity: No Food Insecurity (03/03/2021)  Housing: Low Risk  (01/03/2020)   Transportation Needs: No Transportation Needs (03/03/2021)  Alcohol Screen: Low Risk  (01/03/2020)  Depression (PHQ2-9): Low Risk  (07/15/2021)  Financial Resource Strain: Low Risk  (10/16/2021)  Physical Activity: Sufficiently Active (03/03/2021)  Social Connections: Moderately Integrated (01/03/2020)  Stress: No Stress Concern Present (03/03/2021)  Tobacco Use: Low Risk  (09/01/2021)    CCM Care Plan  Allergies  Allergen Reactions   Penicillins Rash and Other (See Comments)    PATIENT HAS HAD A PCN REACTION WITH IMMEDIATE RASH, FACIAL/TONGUE/THROAT SWELLING, SOB, OR LIGHTHEADEDNESS WITH HYPOTENSION:  #  #  YES  #  #  Has patient had a PCN reaction causing severe rash involving mucus membranes  or skin necrosis: No Has patient had a PCN reaction that required hospitalization: No Has patient had a PCN reaction occurring within the last 10 years: No If all of the above answers are "NO", then may proceed with Cephalosporin use.    Medications Reviewed Today     Reviewed by Viona Gilmore, [facility] (Pharmacist) on 10/16/21 at 53  Med List Status: <None>   Medication Order Taking? Sig Documenting Provider Last Dose Status Informant  aspirin 81 MG tablet [id number]  Take 1 tablet (81 mg total) by mouth at bedtime. Costella, Vista Mink, PA-C  Active   Cholecalciferol (VITAMIN D3) 125 MCG (5000 UT) CAPS [id number]  Take 5,000 Units by mouth at bedtime. [provider]  Active Self  Coenzyme Q10 300 MG CAPS [id number]  Take 300 mg by mouth daily. [provider]  Active Self  Cyanocobalamin (VITAMIN B-12) 2500 MCG SUBL [id number] Yes Place 1 tablet under the tongue. Taking 5 times a week [provider] Taking Active   fish oil-omega-3 fatty acids 1000 MG capsule [id number]  Take 1 g by mouth daily. [provider]  Active Self  gabapentin (NEURONTIN) 100 MG capsule [id number] Yes TAKE 1 CAPSULE(100 MG) BY MOUTH TWICE DAILY Martinique, Betty G, MD Taking Active            Med  Note Legrand Como, [place] Sep 01, 2021  1:14 PM) Once a day  Glucosamine-Chondroitin (MOVE FREE PO) [id number]  Take 1 tablet by mouth daily. [provider]  Active Self  metoprolol succinate (TOPROL-XL) 25 MG 24 hr tablet [id number]  TAKE 1 TABLET BY MOUTH DAILY Sherren Mocha, MD  Active   Multiple Vitamin (MULTIVITAMIN) tablet [id number]  Take 1 tablet by mouth daily. [provider]  Active Self  nitroGLYCERIN (NITROSTAT) 0.4 MG SL tablet [id number]  Place 1 tablet (0.4 mg total) under the tongue every 5 (five) minutes as needed for chest pain. Sherren Mocha, MD  Active            Med Note Lia Hopping, [place] L   Tue Jan 01, 2020  2:18 PM)    Probiotic Product (PROBIOTIC ADVANCED PO) [id number]  Take 1 capsule by mouth daily. [provider]  Active   simvastatin (ZOCOR) 40 MG tablet [id number]  TAKE 1 TABLET BY MOUTH DAILY AT 6:00 PM Josue Hector, MD  Active   tamsulosin (FLOMAX) 0.4 MG CAPS capsule [id number] Yes TAKE 1 CAPSULE(0.4 MG) BY MOUTH DAILY Martinique, Betty G, MD Taking Active             Patient Active Problem List   Diagnosis Date Noted   S/P lumbar laminectomy 01/20/2018   Essential tremor 11/02/2017   Unstable gait 11/02/2017   Lumbar radiculopathy 07/19/2017   Peripheral neuropathy 12/06/2016   Hx of adenomatous colonic polyps 05/22/2014   Left inguinal hernia 09/10/2011   PSA, INCREASED 08/05/2009   CONGENITAL PIGMENTARY ANOMALY OF SKIN 07/25/2007   Dyslipidemia 07/20/2006   Coronary atherosclerosis 07/20/2006   BPH associated with nocturia 07/20/2006    Immunization History  Administered Date(s) Administered   Fluad Quad(high Dose 65+) 11/05/2019   Influenza Whole 12/04/2008, 01/20/2010, 07/26/2010   Influenza, High Dose Seasonal PF 10/16/2015, 11/17/2016, 11/02/2017   Influenza,inj,Quad PF,6+ Mos 11/20/2012, 11/20/2013, 10/08/2014   Influenza-Unspecified 11/17/2016, 10/17/2020   PFIZER Comirnaty(Gray Top)Covid-19 Tri-Sucrose Vaccine  10/17/2020   PFIZER(Purple Top)SARS-COV-2 Vaccination 03/02/2019, 03/27/2019, 10/30/2019   Pneumococcal Conjugate-13 11/20/2013   Pneumococcal Polysaccharide-23 07/20/2006   Td 07/20/2006  Zoster Recombinat (Shingrix) 03/27/2018, 07/14/2018   Zoster, Live 09/09/2010   Patient reports he is doing "pretty well for an 81 year old man". He has a crick in his neck, poor shoulder and cognitive problems. He talked to Dr. Martinique about a hernia problem before and she sent him to a doctor to look at the hernia. He waited because he had un upcoming surgery  and then never went back to discuss surgery.  Patient is drinking more water later in the day. Patient will try to decrease drinking water in the evening as he is using the bathroom 2-3 times a night.  Patient doesn't really look at it BP or pulse at home. He felt bad on Wednesday after his trip and noticed that his pulse was running low. He blames his trip but isn't sure.  Conditions to be addressed/monitored:  Hypertension, Hyperlipidemia, Coronary Artery Disease, BPH, and Prediabetes  Conditions addressed this visit: Prediabetes, BPH, hyperlipidemia  Care Plan : CCM Pharmacy Care Plan  Updates made by Viona Gilmore, [place] since 10/16/2021 12:00 AM     Problem: Problem: Hyperlipidemia, Coronary Artery Disease, BPH, and Prediabetes      Long-Range Goal: Patient-Specific Goal   Start Date: 07/15/2020  Expected End Date: 07/15/2021  Recent Progress: On track  Priority: High  Note:   Current Barriers:  Unable to independently monitor therapeutic efficacy  Pharmacist Clinical Goal(s):  Patient will achieve adherence to monitoring guidelines and medication adherence to achieve therapeutic efficacy through collaboration with PharmD and provider.   Interventions: 1:1 collaboration with Martinique, Betty G, MD regarding development and update of comprehensive plan of care as evidenced by provider attestation and co-signature Inter-disciplinary  care team collaboration (see longitudinal plan of care) Comprehensive medication review performed; medication list updated in electronic medical record  Hyperlipidemia: (LDL goal < 70) -Controlled -Current treatment: Simvastatin 40 mg 1 tablet daily at 6pm - Appropriate, Effective, Safe, Accessible Omega 3 fatty acids 1400 mg 1 capsule daily - Query Appropriate, Effective, Safe, Accessible -Medications previously tried: none  -Current dietary patterns: 135/67 HR 50  -Current exercise habits: did not discuss -Educated on Cholesterol goals;  Benefits of statin for ASCVD risk reduction; Exercise goal of 150 minutes per week; -Counseled on diet and exercise extensively Recommended to continue current medication  CAD (Goal: prevent heart events) -Controlled -Current treatment  Aspirin 81 mg 1 tablet daily - Appropriate, Effective, Safe, Accessible Nitroglycerin 0.4 mg SL tablet as needed - Appropriate, Effective, Safe, Accessible Metoprolol succinate 25 mg 1 tablet daily - Appropriate, Effective, Safe, Accessible -Medications previously tried: n/a  -Counseled on diet and exercise extensively Recommended to continue current medication  Pre-diabetes (A1c goal <6.5%) -Controlled -Current medications: No medications -Medications previously tried: none  -Current home glucose readings fasting glucose: does not need to check post prandial glucose: does not need to check -Denies hypoglycemic/hyperglycemic symptoms -Current meal patterns:  breakfast: did not discuss  lunch: did not discuss   dinner: did not discuss  snacks: eating less ice cream drinks: cut out sweet tea  -Current exercise: exercising 6 days a week -Educated on A1c and blood sugar goals; Carbohydrate counting and/or plate method -Counseled to check feet daily and get yearly eye exams -Counseled on diet and exercise extensively  BPH (Goal: minimize symptoms) -Not ideally controlled -Current treatment  Tamsulosin  0.4 mg 1 capsule daily - Appropriate, Effective, Safe, Accessible -Medications previously tried: saw palmetto (ineffective), finasteride -Recommended to continue current medication Patient is still having symptoms and unsure if  it's a bladder vs prostate problem.  Imbalance (Goal: minimize imbalance) -Not ideally controlled -Current treatment  Gabapentin 1 capsule at bedtime - Appropriate, Effective, Safe, Accessible  -Medications previously tried: n/a  -Recommended to continue current medication   Health Maintenance -Vaccine gaps: tetanus, COVID booster -Current therapy:  Vitamin D3 1000 units 1 capsule at bedtime Coenzyme Q10 300 mg 1 capsule daily Glucosamine-chondroitin 1 tablet daily  Multivitamin 1 tablet daily Vitamin B-12 2500 mcg 1 tablet 5 days a week Probiotic once daily -Educated on Cost vs benefit of each product must be carefully weighed by individual consumer -Patient is satisfied with current therapy and denies issues -Recommended to continue current medication  Patient Goals/Self-Care Activities Patient will:  - take medications as prescribed target a minimum of 150 minutes of moderate intensity exercise weekly  Follow Up Plan: The care management team will reach out to the patient again over the next 120 days.         Medication Assistance: None required.  Patient affirms current coverage meets needs.  Compliance/Adherence/Medication fill history: Care Gaps: COVID booster, influenza vaccine, tetanus BP - 130/64 (09/01/21)  Star-Rating Drugs: Simvastatin 27m - [facility]  [place]  [place]Phone: [phone]: [phone]  Uses pill box? Yes Pt endorses 99% compliance  We discussed: Current pharmacy is preferred with insurance plan and patient is  satisfied with pharmacy services Patient decided to: Continue current medication management strategy  Care Plan and Follow Up Patient Decision:  Patient agrees to Care Plan and Follow-up.  Plan: Telephone follow up appointment with care management team member scheduled for:  6 months  MJeni Salles PharmD, BSpring HillPharmacist [phone]

## 2021-10-16 ENCOUNTER — Ambulatory Visit (INDEPENDENT_AMBULATORY_CARE_PROVIDER_SITE_OTHER): Payer: PPO | Admitting: Pharmacist

## 2021-10-16 ENCOUNTER — Other Ambulatory Visit: Payer: Self-pay

## 2021-10-16 DIAGNOSIS — R7303 Prediabetes: Secondary | ICD-10-CM

## 2021-10-16 DIAGNOSIS — N401 Enlarged prostate with lower urinary tract symptoms: Secondary | ICD-10-CM

## 2021-10-16 DIAGNOSIS — E785 Hyperlipidemia, unspecified: Secondary | ICD-10-CM

## 2021-10-16 DIAGNOSIS — R351 Nocturia: Secondary | ICD-10-CM

## 2021-10-16 NOTE — Patient Instructions (Signed)
Hi Collin [name],  It was great to get to catch up with you again!  Please reach out to me if you have any questions or need anything before our follow up!  Best, Maddie  Jeni Salles, PharmD, [place] [facility] [place]   Visit Information   Goals Addressed   None    Patient Care Plan: CCM Pharmacy Care Plan     Problem Identified: Problem: Hyperlipidemia, Coronary Artery Disease, BPH, and Prediabetes      Long-Range Goal: Patient-Specific Goal   Start Date: 07/15/2020  Expected End Date: 07/15/2021  Recent Progress: On track  Priority: High  Note:   Current Barriers:  Unable to independently monitor therapeutic efficacy  Pharmacist Clinical Goal(s):  Patient will achieve adherence to monitoring guidelines and medication adherence to achieve therapeutic efficacy through collaboration with PharmD and provider.   Interventions: 1:1 collaboration with Martinique, Betty G, MD regarding development and update of comprehensive plan of care as evidenced by provider attestation and co-signature Inter-disciplinary care team collaboration (see longitudinal plan of care) Comprehensive medication review performed; medication list updated in electronic medical record  Hyperlipidemia: (LDL goal < 70) -Controlled -Current treatment: Simvastatin 40 mg 1 tablet daily at 6pm - Appropriate, Effective, Safe, Accessible Omega 3 fatty acids 1400 mg 1 capsule daily - Query Appropriate, Effective, Safe, Accessible -Medications previously tried: none  -Current dietary patterns: 135/67 HR 50  -Current exercise habits: did not discuss -Educated on Cholesterol goals;  Benefits of statin for ASCVD risk reduction; Exercise goal of 150 minutes per week; -Counseled on diet and exercise extensively Recommended to continue current medication  CAD (Goal: prevent heart events) -Controlled -Current treatment  Aspirin 81 mg 1 tablet daily - Appropriate,  Effective, Safe, Accessible Nitroglycerin 0.4 mg SL tablet as needed - Appropriate, Effective, Safe, Accessible Metoprolol succinate 25 mg 1 tablet daily - Appropriate, Effective, Safe, Accessible -Medications previously tried: n/a  -Counseled on diet and exercise extensively Recommended to continue current medication  Pre-diabetes (A1c goal <6.5%) -Controlled -Current medications: No medications -Medications previously tried: none  -Current home glucose readings fasting glucose: does not need to check post prandial glucose: does not need to check -Denies hypoglycemic/hyperglycemic symptoms -Current meal patterns:  breakfast: did not discuss  lunch: did not discuss   dinner: did not discuss  snacks: eating less ice cream drinks: cut out sweet tea  -Current exercise: exercising 6 days a week -Educated on A1c and blood sugar goals; Carbohydrate counting and/or plate method -Counseled to check feet daily and get yearly eye exams -Counseled on diet and exercise extensively  BPH (Goal: minimize symptoms) -Not ideally controlled -Current treatment  Tamsulosin 0.4 mg 1 capsule daily - Appropriate, Effective, Safe, Accessible -Medications previously tried: saw palmetto (ineffective), finasteride -Recommended to continue current medication Patient is still having symptoms and unsure if it's a bladder vs prostate problem.  Imbalance (Goal: minimize imbalance) -Not ideally controlled -Current treatment  Gabapentin 1 capsule at bedtime - Appropriate, Effective, Safe, Accessible  -Medications previously tried: n/a  -Recommended to continue current medication   Health Maintenance -Vaccine gaps: tetanus, COVID booster -Current therapy:  Vitamin D3 1000 units 1 capsule at bedtime Coenzyme Q10 300 mg 1 capsule daily Glucosamine-chondroitin 1 tablet daily  Multivitamin 1 tablet daily Vitamin B-12 2500 mcg 1 tablet 5 days a week Probiotic once daily -Educated on Cost vs benefit of  each product must be carefully weighed by individual consumer -Patient is satisfied with current therapy and denies issues -Recommended to continue  current medication  Patient Goals/Self-Care Activities Patient will:  - take medications as prescribed target a minimum of 150 minutes of moderate intensity exercise weekly  Follow Up Plan: The care management team will reach out to the patient again over the next 120 days.         Patient verbalizes understanding of instructions and care plan provided today and agrees to view in [place]. Active MyChart status and patient understanding of how to access instructions and care plan via MyChart confirmed with patient.    The pharmacy team will reach out to the patient again over the next 120 days.   Collin [name], [facility]

## 2021-10-24 DIAGNOSIS — R351 Nocturia: Secondary | ICD-10-CM

## 2021-10-24 DIAGNOSIS — N401 Enlarged prostate with lower urinary tract symptoms: Secondary | ICD-10-CM

## 2021-10-24 DIAGNOSIS — E785 Hyperlipidemia, unspecified: Secondary | ICD-10-CM

## 2021-11-26 ENCOUNTER — Telehealth: Payer: Self-pay | Admitting: Pharmacist

## 2021-11-26 NOTE — Chronic Care Management (AMB) (Signed)
Chronic Care Management Pharmacy Assistant   Name: Collin [name]  MRN: [id number] DOB: [birth year]  Reason for Encounter: Disease State   Conditions to be addressed/monitored: Per MP Blood pressure and Hr checks by patient  Recent office visits:  None  Recent consult visits:  None   Hospital visits:  None in previous 6 months  Medications: Outpatient Encounter Medications as of 11/26/2021  Medication Sig Note   aspirin 81 MG tablet Take 1 tablet (81 mg total) by mouth at bedtime.    Cholecalciferol (VITAMIN D3) 125 MCG (5000 UT) CAPS Take 5,000 Units by mouth at bedtime.    Coenzyme Q10 300 MG CAPS Take 300 mg by mouth daily.    Cyanocobalamin (VITAMIN B-12) 2500 MCG SUBL Place 1 tablet under the tongue. Taking 5 times a week    fish oil-omega-3 fatty acids 1000 MG capsule Take 1 g by mouth daily.    gabapentin (NEURONTIN) 100 MG capsule TAKE 1 CAPSULE(100 MG) BY MOUTH TWICE DAILY 09/01/2021: Once a day   Glucosamine-Chondroitin (MOVE FREE PO) Take 1 tablet by mouth daily.    metoprolol succinate (TOPROL-XL) 25 MG 24 hr tablet TAKE 1 TABLET BY MOUTH DAILY    Multiple Vitamin (MULTIVITAMIN) tablet Take 1 tablet by mouth daily.    nitroGLYCERIN (NITROSTAT) 0.4 MG SL tablet Place 1 tablet (0.4 mg total) under the tongue every 5 (five) minutes as needed for chest pain.    Probiotic Product (PROBIOTIC ADVANCED PO) Take 1 capsule by mouth daily.    simvastatin (ZOCOR) 40 MG tablet TAKE 1 TABLET BY MOUTH DAILY AT 6:00 PM    tamsulosin (FLOMAX) 0.4 MG CAPS capsule TAKE 1 CAPSULE(0.4 MG) BY MOUTH DAILY    No facility-administered encounter medications on file as of 11/26/2021.   Reviewed chart prior to disease state call. Spoke with patient regarding BP  Recent Office Vitals: BP Readings from Last 3 Encounters:  09/01/21 130/64  07/15/21 128/80  05/05/21 121/72   Pulse Readings from Last 3 Encounters:  07/15/21 65  05/05/21 (!) 48  03/23/21 66    Wt Readings from Last 3  Encounters:  09/01/21 196 lb (88.9 kg)  07/15/21 195 lb 6 oz (88.6 kg)  05/05/21 195 lb (88.5 kg)     Kidney Function Lab Results  Component Value Date/Time   CREATININE 1.32 07/15/2021 02:16 PM   CREATININE 1.16 12/03/2020 08:37 AM   GFR 50.05 (L) 07/15/2021 02:16 PM   GFRNONAA 59 (L) 01/16/2020 09:22 AM   GFRAA 68 01/16/2020 09:22 AM       Latest Ref Rng & Units 07/15/2021    2:16 PM 12/03/2020    8:37 AM 01/16/2020    9:22 AM  BMP  Glucose 70 - 99 mg/dL 145  106  65   BUN 6 - 23 mg/dL '17  21  19   '$ Creatinine 0.40 - 1.50 mg/dL 1.32  1.16  1.16   BUN/Creat Ratio 10 - 24   16   Sodium 135 - 145 mEq/L 136  138  138   Potassium 3.5 - 5.1 mEq/L 4.3  4.4  4.3   Chloride 96 - 112 mEq/L 102  104  101   CO2 19 - 32 mEq/L '27  29  25   '$ Calcium 8.4 - 10.5 mg/dL 9.2  9.3  9.2     Current antihypertensive regimen:  None: Per MP Check and see if pt is checking blood pressures and Hr at home. How often are you checking  your Blood Pressure? Patient reports he does not check his pressures at home nor does he check his heart rate. He reports he does not have hypertension and is not seeing the need to. Patient denies any dizziness/ lightheadedness he reports issues with his balance.He reports his neuropathy is feeling worse to him especially in the left foot and leg and he is thinking of visiting Dr Collin Julian in [place] that he saw several years ago. He reports he has done pt in the past and the exercises at home and do not think they made much of a difference.    Adherence Review: Is the patient currently on ACE/ARB medication? No Does the patient have >5 day gap between last estimated fill dates? No    Care Gaps: COVID Booster - Overdue Flu Vaccine - Overdue AWV- 03/03/21 CCM-  Patient requested we call him in March to schedule  Star Rating Drug: Simvastatin '40mg'$  - Last filled 09/08/21 90 DS at [facility] Pharmacist Assistant [phone]

## 2021-12-09 ENCOUNTER — Telehealth: Payer: Self-pay

## 2021-12-09 NOTE — Telephone Encounter (Signed)
-----   Message from Viona Gilmore, [facility] sent at 12/09/2021  4:37 PM EST ----- Regarding: Hernia and referral? Hi,  Can you possibly call Mr. Slusher? He said he needs a referral for his hernia (preferably within [facility]) and I wasn't sure if he needed a visit with Dr. Martinique first to evaluate or not.   Thanks! Maddie

## 2021-12-09 NOTE — Telephone Encounter (Signed)
Okay to refer for his hernia?

## 2021-12-16 NOTE — Telephone Encounter (Signed)
We can evaluate this problem during his coming appt. If any sudden severe abdominal pain, he needs to seek immediate attention. Thanks, BJ

## 2021-12-16 NOTE — Telephone Encounter (Signed)
I spoke with patient. He is aware of message below & verbalized understanding.

## 2022-01-04 NOTE — Progress Notes (Signed)
HPI: [name] is a 81 y.o.male  with PMHx significant for hearing loss,CAD s/p angioplasty, unstable gait,foot drop, peripheral neuropathy,OA, BPH, lumbar radiculopathy s/p lumbar laminectomy,and dyslipidemia here today for his routine physical examination.  Last CPE: 12/10/20. No new problems since his last visit.  He tries to maintain a healthy diet, eating out sometimes, tried to eat daily vegetables.  He exercises regularly, a few times per week he goes to the gym and walks. He sleeps an average of 7-8 hours per night.  Immunization History  Administered Date(s) Administered   Fluad Quad(high Dose 65+) 11/05/2019   Influenza Whole 12/04/2008, 01/20/2010, 07/26/2010   Influenza, High Dose Seasonal PF 10/16/2015, 11/17/2016, 11/02/2017   Influenza,inj,Quad PF,6+ Mos 11/20/2012, 11/20/2013, 10/08/2014   Influenza-Unspecified 11/17/2016, 10/17/2020, 10/25/2021   PFIZER Comirnaty(Gray Top)Covid-19 Tri-Sucrose Vaccine 10/17/2020   PFIZER(Purple Top)SARS-COV-2 Vaccination 03/02/2019, 03/27/2019, 10/30/2019, 10/25/2021   Pneumococcal Conjugate-13 11/20/2013   Pneumococcal Polysaccharide-23 07/20/2006   Td 07/20/2006   Zoster Recombinat (Shingrix) 03/27/2018, 07/14/2018   Zoster, Live 09/09/2010   Health Maintenance  Topic Date Due   Medicare Annual Wellness (AWV)  03/03/2022   COVID-19 Vaccine (6 - 2023-24 season) 01/21/2022 (Originally 12/20/2021)   Pneumonia Vaccine 20+ Years old  Completed   INFLUENZA VACCINE  Completed   Zoster Vaccines- Shingrix  Completed   HPV VACCINES  Aged Out   DTaP/Tdap/Td  Discontinued   COLONOSCOPY (Pts 45-12yrs Insurance coverage will need to be confirmed)  Discontinued   Left-sided inguinal hernia that was diagnosed about 4 years ago. He saw surgeon but decided to hold on hernia repair until having lumbar spine surgery. He states that the hernia is not causing any pain or discomfort at this time. He is not sure about growth.  BPH on  Flomax 0.4 mg. He is not longer following with urologist.  HLD on Simvastatin 40 mg daily. Follows with Dr. Excell [name] annually.  Lab Results  Component Value Date   CHOL 122 12/03/2020   HDL 61.10 12/03/2020   LDLCALC 52 12/03/2020   TRIG 47.0 12/03/2020   CHOLHDL 2 12/03/2020   He also mentions chronic right shoulder pain and a "crick" in his neck on the right side, with occasional tingling in his hand. No significant changes, he was seeing Dr Collin [name] and would like a referral to see him again.  He has been concerned about unstable gait and memory difficulties as well as hand tremor. He was evaluated by neuro, Dr. Herminio [name]. According to pt, she did not find the tremors worrisome and ruled out Parkinson's disease.  He denies any worsening of memory or any comments from others in this regard. He has not experienced any falls but still struggles with balance.  Has neuro f/u appt scheduled for 05/2022.  Peripheral neuropathy: He is currently taking gabapentin 100 mg daily and has decreased dose from tid. He does not think medication is helping. He has tried to stop medication in the past and has resumed because noted worsening balance.  Review of Systems  Constitutional:  Negative for activity change, appetite change and fever.  HENT:  Negative for nosebleeds, sore throat and trouble swallowing.   Eyes:  Negative for redness and visual disturbance.  Respiratory:  Negative for cough, shortness of breath and wheezing.   Cardiovascular:  Negative for chest pain, palpitations and leg swelling.  Gastrointestinal:  Negative for abdominal pain, blood in stool, nausea and vomiting.  Endocrine: Negative for cold intolerance, heat intolerance, polydipsia, polyphagia and polyuria.  Genitourinary:  Negative for decreased urine volume, dysuria, genital sores, hematuria and testicular pain.  Musculoskeletal:  Positive for arthralgias, gait problem and neck pain.  Skin:  Negative for color change and  rash.  Allergic/Immunologic: Positive for environmental allergies.  Neurological:  Negative for syncope and headaches.  Psychiatric/Behavioral:  Negative for confusion and hallucinations.    Current Outpatient Medications on File Prior to Visit  Medication Sig Dispense Refill   aspirin 81 MG tablet Take 1 tablet (81 mg total) by mouth at bedtime. 30 tablet    Cholecalciferol (VITAMIN D3) 125 MCG (5000 UT) CAPS Take 5,000 Units by mouth at bedtime.     Coenzyme Q10 300 MG CAPS Take 300 mg by mouth daily.     Cyanocobalamin (VITAMIN B-12) 2500 MCG SUBL Place 1 tablet under the tongue. Taking 5 times a week     fish oil-omega-3 fatty acids 1000 MG capsule Take 1 g by mouth daily.     gabapentin (NEURONTIN) 100 MG capsule TAKE 1 CAPSULE(100 MG) BY MOUTH TWICE DAILY 180 capsule 1   Glucosamine-Chondroitin (MOVE FREE PO) Take 1 tablet by mouth daily.     metoprolol succinate (TOPROL-XL) 25 MG 24 hr tablet TAKE 1 TABLET BY MOUTH DAILY 90 tablet 1   Multiple Vitamin (MULTIVITAMIN) tablet Take 1 tablet by mouth daily.     nitroGLYCERIN (NITROSTAT) 0.4 MG SL tablet Place 1 tablet (0.4 mg total) under the tongue every 5 (five) minutes as needed for chest pain. 25 tablet 3   Probiotic Product (PROBIOTIC ADVANCED PO) Take 1 capsule by mouth daily.     simvastatin (ZOCOR) 40 MG tablet TAKE 1 TABLET BY MOUTH DAILY AT 6:00 PM 90 tablet 3   tamsulosin (FLOMAX) 0.4 MG CAPS capsule TAKE 1 CAPSULE(0.4 MG) BY MOUTH DAILY 90 capsule 2   No current facility-administered medications on file prior to visit.   Past Medical History:  Diagnosis Date   Allergy    Arthritis    BPH (benign prostatic hyperplasia)    CAD (coronary artery disease)    Cataract    Diverticulosis of colon    Hx of adenomatous colonic polyps 05/22/2014   Hyperlipidemia    Hypertension    MYOCARDIAL INFARCTION, HX OF 07/20/2006   Qualifier: Diagnosis of  By: Collin Mulligan MD, Collin [name]     Past Surgical History:  Procedure Laterality Date    CATARACT EXTRACTION Bilateral    Bil   COLONOSCOPY     LUMBAR LAMINECTOMY/DECOMPRESSION MICRODISCECTOMY Left 01/20/2018   Procedure: Left Lumbar five Sacral one extraforaminal decompression;  Surgeon: Collin Alert, MD;  [facility];  Service: Neurosurgery;  Laterality: Left;   PTCA      Allergies  Allergen Reactions   Penicillins Rash and Other (See Comments)    PATIENT HAS HAD A PCN REACTION WITH IMMEDIATE RASH, FACIAL/TONGUE/THROAT SWELLING, SOB, OR LIGHTHEADEDNESS WITH HYPOTENSION:  #  #  YES  #  #  Has patient had a PCN reaction causing severe rash involving mucus membranes or skin necrosis: No Has patient had a PCN reaction that required hospitalization: No Has patient had a PCN reaction occurring within the last 10 years: No If all of the above answers are "NO", then may proceed with Cephalosporin use.    Family History  Problem Relation Age of Onset   Heart disease Mother        CABG   Dementia Mother 36   Stroke Father    Heart disease Father    Kidney disease Father  renal failure   AAA (abdominal aortic aneurysm) Father    Hypertension Sister    Diabetes Brother    AAA (abdominal aortic aneurysm) Paternal Uncle    Breast cancer Daughter    Colon cancer Neg Hx     Social History   Socioeconomic History   Marital status: Married    Spouse name: Not on file   Number of children: 3   Years of education: 16   Highest education level: Not on file  Occupational History   Occupation: retired  Tobacco Use   Smoking status: Never   Smokeless tobacco: Never  Vaping Use   Vaping Use: Never used  Substance and Sexual Activity   Alcohol use: Yes    Comment: wine once a week and beer once a week   Drug use: No   Sexual activity: Not on file  Other Topics Concern   Not on file  Social History Narrative   Lives with wife in a 2 story home.  Has 3 children.     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    Education: college.     Occasional beer, no smoking, no drugs   Social Determinants of Health   Financial Resource Strain: Low Risk  (10/16/2021)   Overall Financial Resource Strain (CARDIA)    Difficulty of Paying Living Expenses: Not hard at all  Food Insecurity: No Food Insecurity (03/03/2021)   Hunger Vital Sign    Worried About Running Out of Food in the Last Year: Never true    Ran Out of Food in the Last Year: Never true  Transportation Needs: No Transportation Needs (03/03/2021)   PRAPARE - Administrator, Civil Service (Medical): No    Lack of Transportation (Non-Medical): No  Physical Activity: Sufficiently Active (03/03/2021)   Exercise Vital Sign    Days of Exercise per Week: 6 days    Minutes of Exercise per Session: 90 min  Stress: No Stress Concern Present (03/03/2021)   [employer] of Occupational Health - Occupational Stress Questionnaire    Feeling of Stress : Not at all  Social Connections: Moderately Integrated (01/03/2020)   Social Connection and Isolation Panel [NHANES]    Frequency of Communication with Friends and Family: More than three times a week    Frequency of Social Gatherings with Friends and Family: Three times a week    Attends Religious Services: More than 4 times per year    Active Member of Clubs or Organizations: No    Attends Banker Meetings: Never    Marital Status: Married   Vitals:   01/05/22 1452  BP: 110/70  Pulse: 81  Resp: 16  Temp: 98.6 F (37 C)  SpO2: 98%   Body mass index is 25.73 kg/m. Wt Readings from Last 3 Encounters:     01/05/22 195 lb (88.5 kg)  09/01/21 196 lb (88.9 kg)   07/15/21 195 lb 6 oz (88.6 kg)     Physical Exam Vitals and nursing note reviewed.  Constitutional:      General: He is not in acute distress.    Appearance: He is well-developed and well-groomed.  HENT:     Head: Normocephalic and atraumatic.     Right Ear: External ear normal.     Left Ear: External ear normal.     Ears:     Comments:  Hearing aids in place.    Mouth/Throat:     Mouth: Mucous membranes are moist.  Pharynx: Oropharynx is clear.     Comments: Tonsil stones bilateral. Eyes:     Conjunctiva/sclera: Conjunctivae normal.     Pupils: Pupils are equal, round, and reactive to light.  Neck:     Trachea: No tracheal deviation.  Cardiovascular:     Rate and Rhythm: Normal rate and regular rhythm.     Pulses:          Dorsalis pedis pulses are 2+ on the right side and 2+ on the left side.     Heart sounds: No murmur heard.    Comments: Trace pitting LE edema, bilateral. Pulmonary:     Effort: Pulmonary effort is normal. No respiratory distress.     Breath sounds: Normal breath sounds.  Abdominal:     Palpations: Abdomen is soft. There is no hepatomegaly or mass.     Tenderness: There is no abdominal tenderness.     Hernia: A hernia (Able to reduce partially, no pain elicited.) is present. Hernia is present in the left inguinal area.  Musculoskeletal:        General: No tenderness.     Right shoulder: Decreased range of motion (Mild.).     Left shoulder: Normal range of motion.     Cervical back: Normal range of motion.     Comments: No signs of synovitis.  Lymphadenopathy:     Cervical: No cervical adenopathy.  Skin:    General: Skin is warm.     Findings: No erythema.  Neurological:     Mental Status: He is alert and oriented to person, place, and time.     Cranial Nerves: No cranial nerve deficit.     Deep Tendon Reflexes:     Reflex Scores:      Bicep reflexes are 2+ on the right side and 2+ on the left side.      Patellar reflexes are 2+ on the right side and 2+ on the left side.    Comments: Mildly unstable gait, not assisted. Left drop foot.  Psychiatric:        Mood and Affect: Affect normal. Mood is anxious.   ASSESSMENT AND PLAN:  Collin [name] was seen today for annual exam.  Diagnoses and all orders for this visit: Orders Placed This Encounter  Procedures   Ambulatory referral to  General Surgery   Ambulatory referral to Sports Medicine   POC HgB A1c   Lab Results  Component Value Date   HGBA1C 5.7 01/05/2022   Routine general medical examination at a health care facility Assessment & Plan: We discussed the importance of regular physical activity and healthy diet for prevention of chronic illness and/or complications. Preventive guidelines reviewed. Vaccination up to date. Next CPE in a year.   Right shoulder pain, unspecified chronicity Assessment & Plan: Chronic. Referral to sport medicine placed as requested.  Orders: -     Ambulatory referral to Sports Medicine  Prediabetes Assessment & Plan: Last HgA1C 6.3 in 06/2021, today 5.7. Continue a healthy life style for diabetes prevention.  Orders: -     POCT glycosylated hemoglobin (Hb A1C)  Left inguinal hernia Assessment & Plan: Asymptomatic. We discussed possible complications, referral to general surgeon placed. Instructed about warning signs.  Orders: -     Ambulatory referral to General Surgery  Lumbar radiculopathy Assessment & Plan: S/p lumbar laminectomy (01/20/18) with residual LE numbness. He does not feel like Gabapentin is helping, so recommend weaning it off. Gabapentin side effects discussed, including increasing risk for falls. Fall precautions discussed.  Orders: -     Ambulatory referral to Sports Medicine  Idiopathic peripheral neuropathy Assessment & Plan: He does not feel like Gabapentin is helping, recommend weaning off. LE's NCS/EMG 11/30/2016:Electrophysiologic findings are most consistent with a chronic sensorimotor polyneuropathy, axon loss in type, affecting the bilateral lower extremities; severe in degree electrically.There is also evidence of a superimposed L5 radiculopathy affecting bilateral lower extremities, which is significantly worse on the left and severe in degree electrically. Adequate skin care and avoidance of trauma.   Return in 1 year (on  01/06/2023) for CPE.  Kitana Gage G. [place], MD  [facility]. [place] office.

## 2022-01-05 ENCOUNTER — Ambulatory Visit (INDEPENDENT_AMBULATORY_CARE_PROVIDER_SITE_OTHER): Payer: PPO | Admitting: Family Medicine

## 2022-01-05 ENCOUNTER — Encounter: Payer: Self-pay | Admitting: Family Medicine

## 2022-01-05 VITALS — BP 110/70 | HR 81 | Temp 98.6°F | Resp 16 | Ht 73.0 in | Wt 195.0 lb

## 2022-01-05 DIAGNOSIS — R7303 Prediabetes: Secondary | ICD-10-CM | POA: Diagnosis not present

## 2022-01-05 DIAGNOSIS — E785 Hyperlipidemia, unspecified: Secondary | ICD-10-CM | POA: Diagnosis not present

## 2022-01-05 DIAGNOSIS — K4090 Unilateral inguinal hernia, without obstruction or gangrene, not specified as recurrent: Secondary | ICD-10-CM | POA: Diagnosis not present

## 2022-01-05 DIAGNOSIS — M25511 Pain in right shoulder: Secondary | ICD-10-CM | POA: Diagnosis not present

## 2022-01-05 DIAGNOSIS — G609 Hereditary and idiopathic neuropathy, unspecified: Secondary | ICD-10-CM | POA: Diagnosis not present

## 2022-01-05 DIAGNOSIS — Z0000 Encounter for general adult medical examination without abnormal findings: Secondary | ICD-10-CM

## 2022-01-05 DIAGNOSIS — M5416 Radiculopathy, lumbar region: Secondary | ICD-10-CM | POA: Diagnosis not present

## 2022-01-05 LAB — POCT GLYCOSYLATED HEMOGLOBIN (HGB A1C): HbA1c, POC (prediabetic range): 5.7 % (ref 5.7–6.4)

## 2022-01-05 NOTE — Patient Instructions (Addendum)
A few things to remember from today's visit:  Routine general medical examination at a health care facility  Right shoulder pain, unspecified chronicity - Plan: Ambulatory referral to Sports Medicine  Prediabetes  Left inguinal hernia - Plan: Ambulatory referral to General Surgery  Lumbar radiculopathy - Plan: Ambulatory referral to Sports Medicine  Idiopathic peripheral neuropathy  [facility],  Address: [address], [place], [place] [address] Phone: [phone] Fax [phone]  If you do not feel like Gabapentin is helping, I prefer you to wean medication off. Try taking it every other day for 2 weeks then every 3rd day for 2 weeks then stop. Be sure you follow with Dr Burt Knack.  Preventive Care 26 Years and Older, Male Preventive care refers to lifestyle choices and visits with your health care provider that can promote health and wellness. Preventive care visits are also called wellness exams. What can I expect for my preventive care visit? Counseling During your preventive care visit, your health care provider may ask about your: Medical history, including: Past medical problems. Family medical history. History of falls. Current health, including: Emotional well-being. Home life and relationship well-being. Sexual activity. Memory and ability to understand (cognition). Lifestyle, including: Alcohol, nicotine or tobacco, and drug use. Access to firearms. Diet, exercise, and sleep habits. Work and work Statistician. Sunscreen use. Safety issues such as seatbelt and bike helmet use. Physical exam Your health care provider will check your: Height and weight. These may be used to calculate your BMI (body mass index). BMI is a measurement that tells if you are at a healthy weight. Waist circumference. This measures the distance around your waistline. This measurement also tells if you are at a healthy weight and may help predict your risk of certain  diseases, such as type 2 diabetes and high blood pressure. Heart rate and blood pressure. Body temperature. Skin for abnormal spots. What immunizations do I need?  Vaccines are usually given at various ages, according to a schedule. Your health care provider will recommend vaccines for you based on your age, medical history, and lifestyle or other factors, such as travel or where you work. What tests do I need? Screening Your health care provider may recommend screening tests for certain conditions. This may include: Lipid and cholesterol levels. Diabetes screening. This is done by checking your blood sugar (glucose) after you have not eaten for a while (fasting). Hepatitis C test. Hepatitis B test. HIV (human immunodeficiency virus) test. STI (sexually transmitted infection) testing, if you are at risk. Lung cancer screening. Colorectal cancer screening. Prostate cancer screening. Abdominal aortic aneurysm (AAA) screening. You may need this if you are a current or former smoker. Talk with your health care provider about your test results, treatment options, and if necessary, the need for more tests. Follow these instructions at home: Eating and drinking  Eat a diet that includes fresh fruits and vegetables, whole grains, lean protein, and low-fat dairy products. Limit your intake of foods with high amounts of sugar, saturated fats, and salt. Take vitamin and mineral supplements as recommended by your health care provider. Do not drink alcohol if your health care provider tells you not to drink. If you drink alcohol: Limit how much you have to 0-2 drinks a day. Know how much alcohol is in your drink. In the [place] one drink equals one 12 oz bottle of beer (355 mL), one 5 oz glass of wine (148 mL), or one 1 oz glass of hard liquor (  44 mL). Lifestyle Brush your teeth every morning and night with fluoride toothpaste. Floss one time each day. Exercise for at least 30 minutes 5 or more  days each week. Do not use any products that contain nicotine or tobacco. These products include cigarettes, chewing tobacco, and vaping devices, such as e-cigarettes. If you need help quitting, ask your health care provider. Do not use drugs. If you are sexually active, practice safe sex. Use a condom or other form of protection to prevent STIs. Take aspirin only as told by your health care provider. Make sure that you understand how much to take and what form to take. Work with your health care provider to find out whether it is safe and beneficial for you to take aspirin daily. Ask your health care provider if you need to take a cholesterol-lowering medicine (statin). Find healthy ways to manage stress, such as: Meditation, yoga, or listening to music. Journaling. Talking to a trusted person. Spending time with friends and family. Safety Always wear your seat belt while driving or riding in a vehicle. Do not drive: If you have been drinking alcohol. Do not ride with someone who has been drinking. When you are tired or distracted. While texting. If you have been using any mind-altering substances or drugs. Wear a helmet and other protective equipment during sports activities. If you have firearms in your house, make sure you follow all gun safety procedures. Minimize exposure to UV radiation to reduce your risk of skin cancer. What's next? Visit your health care provider once a year for an annual wellness visit. Ask your health care provider how often you should have your eyes and teeth checked. Stay up to date on all vaccines. This information is not intended to replace advice given to you by your health care provider. Make sure you discuss any questions you have with your health care provider. Document Revised: 07/09/2020 Document Reviewed: 07/09/2020 Elsevier Patient Education  [place].  If you need refills for medications you take chronically, please call your  pharmacy. Do not use My Chart to request refills or for acute issues that need immediate attention. If you send a my chart message, it may take a few days to be addressed, specially if I am not in the office.  Please be sure medication list is accurate. If a new problem present, please set up appointment sooner than planned today.

## 2022-01-07 ENCOUNTER — Ambulatory Visit (INDEPENDENT_AMBULATORY_CARE_PROVIDER_SITE_OTHER): Payer: PPO | Admitting: Sports Medicine

## 2022-01-07 ENCOUNTER — Ambulatory Visit (INDEPENDENT_AMBULATORY_CARE_PROVIDER_SITE_OTHER): Payer: PPO

## 2022-01-07 VITALS — BP 120/78 | HR 62 | Ht 73.0 in | Wt 195.0 lb

## 2022-01-07 DIAGNOSIS — M545 Low back pain, unspecified: Secondary | ICD-10-CM

## 2022-01-07 DIAGNOSIS — M25511 Pain in right shoulder: Secondary | ICD-10-CM | POA: Diagnosis not present

## 2022-01-07 DIAGNOSIS — M5441 Lumbago with sciatica, right side: Secondary | ICD-10-CM

## 2022-01-07 DIAGNOSIS — G8929 Other chronic pain: Secondary | ICD-10-CM | POA: Diagnosis not present

## 2022-01-07 DIAGNOSIS — M19011 Primary osteoarthritis, right shoulder: Secondary | ICD-10-CM | POA: Diagnosis not present

## 2022-01-07 DIAGNOSIS — M5136 Other intervertebral disc degeneration, lumbar region: Secondary | ICD-10-CM | POA: Diagnosis not present

## 2022-01-07 DIAGNOSIS — M5442 Lumbago with sciatica, left side: Secondary | ICD-10-CM

## 2022-01-07 MED ORDER — MELOXICAM 15 MG PO TABS: 15.0000 mg | ORAL_TABLET | Freq: Every day | ORAL | 0 refills | Status: DC

## 2022-01-07 NOTE — Assessment & Plan Note (Signed)
We discussed the importance of regular physical activity and healthy diet for prevention of chronic illness and/or complications. Preventive guidelines reviewed. Vaccination up to date. Next CPE in a year. 

## 2022-01-07 NOTE — Assessment & Plan Note (Signed)
He does not feel like Gabapentin is helping, recommend weaning off. LE's NCS/EMG 11/30/2016:Electrophysiologic findings are most consistent with a chronic sensorimotor polyneuropathy, axon loss in type, affecting the bilateral lower extremities; severe in degree electrically.There is also evidence of a superimposed L5 radiculopathy affecting bilateral lower extremities, which is significantly worse on the left and severe in degree electrically. Adequate skin care and avoidance of trauma.

## 2022-01-07 NOTE — Progress Notes (Signed)
Collin [name] D.[place] Phone: [phone]   Assessment and Plan:     1. Chronic bilateral low back pain with bilateral sciatica 2. DDD (degenerative disc disease), lumbar -Chronic with exacerbation, subsequent visit - Recurrence of low back pain without radicular symptoms with patient having history of left-sided L5-S1 extraforaminal decompression in 2019 that previously eliminated pain.  Pain at this time more consistent with chronic degenerative changes seen throughout lumbar spine - X-ray obtained in clinic.  My interpretation: No acute fracture vertebral collapse.  Significant degenerative changes throughout vertebrae and bilateral facets most prominent L4-S1 - Start meloxicam 15 mg daily x2 weeks.  If still having pain after 2 weeks, complete 3rd-week of meloxicam. May use remaining meloxicam as needed once daily for pain control.  Do not to use additional NSAIDs while taking meloxicam.  May use Tylenol [phone] mg 2 to 3 times a day for breakthrough pain.  Patient did have mildly decreased GFR in the 50s when last checked in 06/2021, however previous GFR has always been >60.  Do not recommend prolonged NSAID use, though patient should be able to tolerate a short course - Start HEP and physical therapy  3. Chronic right shoulder pain 4. Primary osteoarthritis of right shoulder -Chronic with exacerbation, initial sports medicine visit - Consistent with flare of osteoarthritis of right glenohumeral joint.  Differential includes impingement syndrome, rotator cuff pathology - Start meloxicam 15 mg daily x2 weeks.  If still having pain after 2 weeks, complete 3rd-week of meloxicam. May use remaining meloxicam as needed once daily for pain control.  Do not to use additional NSAIDs while taking meloxicam.  May use Tylenol [phone] mg 2 to 3 times a day for breakthrough pain. - X-ray obtained in clinic.  My  interpretation: No acute fracture or dislocation.  Cortical spurring of humeral head as well as sclerosis of the glenoid - Start HEP and physical therapy  Other orders - meloxicam (MOBIC) 15 MG tablet; Take 1 tablet (15 mg total) by mouth daily.    Pertinent previous records reviewed include surgery procedure note 01/20/2018   Follow Up: 4 weeks for reevaluation.  If no improvement or worsening of symptoms, could consider subacromial CSI versus intra-articular shoulder CSI versus advanced imaging and potential lumbar CSI.   Subjective:   I, Collin [name], am serving as a Education administrator for [employer]  Chief Complaint: low back pain and right shoulder pain   HPI: low back and right shoulder pain   01/07/22 Patient is  81 year old male complaining of low back and right shoulder pain. Patient states that he hurt his shoulders lifting weights 6 months ago , not to worried can still swing a golf club has pain when she tucks his shirt in  Back pain for years hx of back surgery 09, L5 bulging dics and that's what the pain feels like , pain radiates down to the hip ,  pain on the right side , no numbness or tingling , no meds for the pain if he stretches that helps with the pain , standing is hard to do , walking eases the pain , and no pain when sitting , balance is also affected   Relevant Historical Information: History of L5-S1 left-sided decompression  Additional pertinent review of systems negative.   Current Outpatient Medications:    aspirin 81 MG tablet, Take 1 tablet (81 mg total) by mouth at bedtime., Disp: 30 tablet,  Rfl:    Cholecalciferol (VITAMIN D3) 125 MCG (5000 UT) CAPS, Take 5,000 Units by mouth at bedtime., Disp: , Rfl:    Coenzyme Q10 300 MG CAPS, Take 300 mg by mouth daily., Disp: , Rfl:    Cyanocobalamin (VITAMIN B-12) 2500 MCG SUBL, Place 1 tablet under the tongue. Taking 5 times a week, Disp: , Rfl:    fish oil-omega-3 fatty acids 1000 MG capsule, Take 1 g by  mouth daily., Disp: , Rfl:    gabapentin (NEURONTIN) 100 MG capsule, TAKE 1 CAPSULE(100 MG) BY MOUTH TWICE DAILY, Disp: 180 capsule, Rfl: 1   Glucosamine-Chondroitin (MOVE FREE PO), Take 1 tablet by mouth daily., Disp: , Rfl:    meloxicam (MOBIC) 15 MG tablet, Take 1 tablet (15 mg total) by mouth daily., Disp: 30 tablet, Rfl: 0   metoprolol succinate (TOPROL-XL) 25 MG 24 hr tablet, TAKE 1 TABLET BY MOUTH DAILY, Disp: 90 tablet, Rfl: 1   Multiple Vitamin (MULTIVITAMIN) tablet, Take 1 tablet by mouth daily., Disp: , Rfl:    nitroGLYCERIN (NITROSTAT) 0.4 MG SL tablet, Place 1 tablet (0.4 mg total) under the tongue every 5 (five) minutes as needed for chest pain., Disp: 25 tablet, Rfl: 3   Probiotic Product (PROBIOTIC ADVANCED PO), Take 1 capsule by mouth daily., Disp: , Rfl:    simvastatin (ZOCOR) 40 MG tablet, TAKE 1 TABLET BY MOUTH DAILY AT 6:00 PM, Disp: 90 tablet, Rfl: 3   tamsulosin (FLOMAX) 0.4 MG CAPS capsule, TAKE 1 CAPSULE(0.4 MG) BY MOUTH DAILY, Disp: 90 capsule, Rfl: 2   Objective:     Vitals:   01/07/22 1402  BP: 120/78  Pulse: 62  SpO2: 95%  Weight: 195 lb (88.5 kg)  Height: '6\' 1"'$  (1.854 m)      Body mass index is 25.73 kg/m.    Physical Exam:    Gen: Appears well, nad, nontoxic and pleasant Psych: Alert and oriented, appropriate mood and affect Neuro: sensation intact, strength is 5/5 in upper and lower extremities, muscle tone wnl Skin: no susupicious lesions or rashes  Back - Normal skin, Spine with normal alignment and no deformity.   No tenderness to vertebral process palpation.   Paraspinous muscles are not tender and without spasm NTTP gluteal musculature Straight leg raise negative Trendelenberg negative Piriformis Test negative  Pain with lumbar extension and flexion    Right shoulder: no deformity, swelling or muscle wasting No scapular winging FF 160 with painful arc, abd 150 with painful arc, int 20 with painful arc, ext 80 TTP deltoid NTTP over the  [place], clavicle, ac, coracoid, biceps groove, humerus,  , trapezius, cervical spine Positive Hawking's, empty can, O'Brien Neg neer, crossarm, subscap liftoff, speeds Neg ant drawer, sulcus sign, apprehension Negative Spurling's test bilat FROM of neck   Electronically signed by:  Collin [name] D.[name] 3:46 PM 01/07/22

## 2022-01-07 NOTE — Assessment & Plan Note (Addendum)
S/p lumbar laminectomy (01/20/18) with residual LE numbness. He does not feel like Gabapentin is helping, so recommend weaning it off. Gabapentin side effects discussed, including increasing risk for falls. Fall precautions discussed.

## 2022-01-07 NOTE — Assessment & Plan Note (Signed)
Chronic. Referral to sport medicine placed as requested.

## 2022-01-07 NOTE — Patient Instructions (Addendum)
Good to see you  - Start meloxicam 15 mg daily x2 weeks.  If still having pain after 2 weeks, complete 3rd-week of meloxicam. May use remaining meloxicam as needed once daily for pain control.  Do not to use additional NSAIDs while taking meloxicam.  May use Tylenol [phone] mg 2 to 3 times a day for breakthrough pain. Can discontinue gabapentin  Pt referral  Low back HEP  4 week follow

## 2022-01-07 NOTE — Assessment & Plan Note (Signed)
Last HgA1C 6.3 in 06/2021, today 5.7. Continue a healthy life style for diabetes prevention.

## 2022-01-07 NOTE — Assessment & Plan Note (Signed)
Asymptomatic. We discussed possible complications, referral to general surgeon placed. Instructed about warning signs.

## 2022-01-07 NOTE — Assessment & Plan Note (Signed)
Continue Simvastatin 40 mg daily and low fat diet. He is not fasting today. Follows with cardiologist annually, next visit due in 02/2022.

## 2022-01-27 DIAGNOSIS — K4090 Unilateral inguinal hernia, without obstruction or gangrene, not specified as recurrent: Secondary | ICD-10-CM | POA: Diagnosis not present

## 2022-02-05 NOTE — Progress Notes (Unsigned)
Collin [name] D.[place] Phone: [phone]   Assessment and Plan:    1. Chronic bilateral low back pain with bilateral sciatica 2. DDD (degenerative disc disease), lumbar -Chronic with exacerbation, subsequent visit - Recurrence of low back pain with patient having history of left-sided L5-S1 extraforaminal decompression in 2019 that previously eliminated similar pain.  Patient has had no improvement with HEP and completing 3-week course of meloxicam - Based on failure to improve with >6 weeks of conservative therapy including treatment prescribed by myself and PCP, pain frequently >6/10, generalized weakness of left lower extremity, and bilateral radicular symptoms, I feel it is necessary to proceed with lumbar MRI at this time - Discontinue meloxicam and may use remainder as needed.  Do not recommend long-term use of NSAIDs due to age and comorbidities - Ambulatory referral to Physical Therapy - MR Lumbar Spine Wo Contrast; Future  3. Chronic right shoulder pain 4. Primary osteoarthritis of right shoulder -Chronic and stable, subsequent visit - Mild improvement in symptoms with HEP and course of meloxicam.  Overall, patient states that his pain is tolerable in the shoulder and he wishes to focus on back pain at this time - Ambulatory referral to Physical Therapy - MR Lumbar Spine Wo Contrast; Future    Pertinent previous records reviewed include none   Follow Up: 3 days after MRI to review report and create treatment plan   Subjective:   I, Collin [name], am serving as a Education administrator for [employer]   Chief Complaint: low back pain and right shoulder pain    HPI: low back and right shoulder pain    01/07/22 Patient is  82 year old male complaining of low back and right shoulder pain. Patient states that he hurt his shoulders lifting weights 6 months ago , not to worried can still swing a golf  club has pain when she tucks his shirt in   Back pain for years hx of back surgery 09, L5 bulging dics and that's what the pain feels like , pain radiates down to the hip ,  pain on the right side , no numbness or tingling , no meds for the pain if he stretches that helps with the pain , standing is hard to do , walking eases the pain , and no pain when sitting , balance is also affected   02/08/2022 Patient states his back is the same and the shoulder is a little better     Relevant Historical Information: History of L5-S1 left-sided decompression  Additional pertinent review of systems negative.   Current Outpatient Medications:    aspirin 81 MG tablet, Take 1 tablet (81 mg total) by mouth at bedtime., Disp: 30 tablet, Rfl:    Cholecalciferol (VITAMIN D3) 125 MCG (5000 UT) CAPS, Take 5,000 Units by mouth at bedtime., Disp: , Rfl:    Coenzyme Q10 300 MG CAPS, Take 300 mg by mouth daily., Disp: , Rfl:    Cyanocobalamin (VITAMIN B-12) 2500 MCG SUBL, Place 1 tablet under the tongue. Taking 5 times a week, Disp: , Rfl:    fish oil-omega-3 fatty acids 1000 MG capsule, Take 1 g by mouth daily., Disp: , Rfl:    gabapentin (NEURONTIN) 100 MG capsule, TAKE 1 CAPSULE(100 MG) BY MOUTH TWICE DAILY, Disp: 180 capsule, Rfl: 1   Glucosamine-Chondroitin (MOVE FREE PO), Take 1 tablet by mouth daily., Disp: , Rfl:    meloxicam (MOBIC) 15 MG tablet,  Take 1 tablet (15 mg total) by mouth daily., Disp: 30 tablet, Rfl: 0   metoprolol succinate (TOPROL-XL) 25 MG 24 hr tablet, TAKE 1 TABLET BY MOUTH DAILY, Disp: 90 tablet, Rfl: 1   Multiple Vitamin (MULTIVITAMIN) tablet, Take 1 tablet by mouth daily., Disp: , Rfl:    nitroGLYCERIN (NITROSTAT) 0.4 MG SL tablet, Place 1 tablet (0.4 mg total) under the tongue every 5 (five) minutes as needed for chest pain., Disp: 25 tablet, Rfl: 3   Probiotic Product (PROBIOTIC ADVANCED PO), Take 1 capsule by mouth daily., Disp: , Rfl:    simvastatin (ZOCOR) 40 MG tablet, TAKE 1  TABLET BY MOUTH DAILY AT 6:00 PM, Disp: 90 tablet, Rfl: 3   tamsulosin (FLOMAX) 0.4 MG CAPS capsule, TAKE 1 CAPSULE(0.4 MG) BY MOUTH DAILY, Disp: 90 capsule, Rfl: 2   Objective:     Vitals:   02/08/22 1511  BP: 122/78  Pulse: 60  SpO2: 97%  Weight: 201 lb (91.2 kg)  Height: '6\' 1"'$  (1.854 m)      Body mass index is 26.52 kg/m.    Physical Exam:    Gen: Appears well, nad, nontoxic and pleasant Psych: Alert and oriented, appropriate mood and affect Neuro: sensation intact, strength is 5/5 in upper and lower extremities, muscle tone wnl Skin: no susupicious lesions or rashes   Back - Normal skin, Spine with normal alignment and no deformity.   No tenderness to vertebral process palpation.   Paraspinous muscles are not tender and without spasm NTTP gluteal musculature Straight leg raise negative Trendelenberg negative Piriformis Test negative  Pain with lumbar extension and flexion       Electronically signed by:  Collin [name] D.[name] 4:23 PM 02/08/22

## 2022-02-08 ENCOUNTER — Ambulatory Visit (INDEPENDENT_AMBULATORY_CARE_PROVIDER_SITE_OTHER): Payer: PPO | Admitting: Sports Medicine

## 2022-02-08 VITALS — BP 122/78 | HR 60 | Ht 73.0 in | Wt 201.0 lb

## 2022-02-08 DIAGNOSIS — M19011 Primary osteoarthritis, right shoulder: Secondary | ICD-10-CM

## 2022-02-08 DIAGNOSIS — G8929 Other chronic pain: Secondary | ICD-10-CM

## 2022-02-08 DIAGNOSIS — M25511 Pain in right shoulder: Secondary | ICD-10-CM | POA: Diagnosis not present

## 2022-02-08 DIAGNOSIS — M5136 Other intervertebral disc degeneration, lumbar region: Secondary | ICD-10-CM

## 2022-02-08 DIAGNOSIS — M5441 Lumbago with sciatica, right side: Secondary | ICD-10-CM

## 2022-02-08 DIAGNOSIS — M5442 Lumbago with sciatica, left side: Secondary | ICD-10-CM | POA: Diagnosis not present

## 2022-02-08 NOTE — Patient Instructions (Addendum)
Good to see you MRI referral  Follow up 3 days after to discuss results  

## 2022-02-10 ENCOUNTER — Ambulatory Visit: Payer: PPO | Admitting: Family Medicine

## 2022-02-23 ENCOUNTER — Other Ambulatory Visit: Payer: Self-pay | Admitting: Cardiovascular Disease

## 2022-02-23 DIAGNOSIS — I2510 Atherosclerotic heart disease of native coronary artery without angina pectoris: Secondary | ICD-10-CM

## 2022-02-26 ENCOUNTER — Ambulatory Visit
Admission: RE | Admit: 2022-02-26 | Discharge: 2022-02-26 | Disposition: A | Discharge status: Home / Self Care | Payer: PPO | Source: Ambulatory Visit | Attending: Sports Medicine | Admitting: Sports Medicine

## 2022-02-26 DIAGNOSIS — M19011 Primary osteoarthritis, right shoulder: Secondary | ICD-10-CM

## 2022-02-26 DIAGNOSIS — M48061 Spinal stenosis, lumbar region without neurogenic claudication: Secondary | ICD-10-CM | POA: Diagnosis not present

## 2022-02-26 DIAGNOSIS — G8929 Other chronic pain: Secondary | ICD-10-CM

## 2022-02-26 DIAGNOSIS — M545 Low back pain, unspecified: Secondary | ICD-10-CM | POA: Diagnosis not present

## 2022-02-26 DIAGNOSIS — M5136 Other intervertebral disc degeneration, lumbar region: Secondary | ICD-10-CM

## 2022-03-01 ENCOUNTER — Ambulatory Visit (INDEPENDENT_AMBULATORY_CARE_PROVIDER_SITE_OTHER): Payer: PPO | Admitting: Sports Medicine

## 2022-03-01 VITALS — HR 93 | Ht 73.0 in | Wt 200.0 lb

## 2022-03-01 DIAGNOSIS — G8929 Other chronic pain: Secondary | ICD-10-CM | POA: Diagnosis not present

## 2022-03-01 DIAGNOSIS — M5136 Other intervertebral disc degeneration, lumbar region: Secondary | ICD-10-CM

## 2022-03-01 DIAGNOSIS — M5126 Other intervertebral disc displacement, lumbar region: Secondary | ICD-10-CM

## 2022-03-01 DIAGNOSIS — M5442 Lumbago with sciatica, left side: Secondary | ICD-10-CM | POA: Diagnosis not present

## 2022-03-01 DIAGNOSIS — M48062 Spinal stenosis, lumbar region with neurogenic claudication: Secondary | ICD-10-CM | POA: Diagnosis not present

## 2022-03-01 DIAGNOSIS — M5441 Lumbago with sciatica, right side: Secondary | ICD-10-CM

## 2022-03-01 NOTE — Progress Notes (Signed)
Collin [name] D.[place] Phone: [phone]   Assessment and Plan:     1. Chronic bilateral low back pain with bilateral sciatica 2. DDD (degenerative disc disease), lumbar 3. Spinal stenosis, lumbar region, with neurogenic claudication 4. Lumbar herniated disc -Chronic with exacerbation, subsequent visit - Continued low back pain with right-sided radicular symptoms with past medical history of left-sided L5-S1 extraforaminal decompression in 2019. - Reviewed patient's MRI which showed worsening stenosis and bilateral neural foraminal stenosis at L3-4 due to disc bulge and severe facet arthrosis, unchanged severe right and moderate L4-5 neural foraminal stenosis and continued severe left L5-S1 neuroforaminal stenosis - Patient symptoms are most consistent with right-sided radiculopathy at L3-4 versus L4-5 - Patient elected for epidural CSI right-sided L3-4.  Order placed today - Will refer to neurosurgery for further evaluation - Can use Tylenol as needed for day-to-day pain relief and continue low back HEP    Pertinent previous records reviewed include lumbar MRI 02/26/2022   Follow Up: 2 weeks after lumbar epidural   Subjective:   I, Collin [name], am serving as a Education administrator for Doctor Collin [name]   Chief Complaint: low back pain and right shoulder pain    HPI: low back and right shoulder pain    01/07/22 Patient is  82 year old male complaining of low back and right shoulder pain. Patient states that he hurt his shoulders lifting weights 6 months ago , not to worried can still swing a golf club has pain when she tucks his shirt in   Back pain for years hx of back surgery 09, L5 bulging dics and that's what the pain feels like , pain radiates down to the hip ,  pain on the right side , no numbness or tingling , no meds for the pain if he stretches that helps with the pain , standing is hard to do ,  walking eases the pain , and no pain when sitting , balance is also affected    02/08/2022 Patient states his back is the same and the shoulder is a little better    03/01/2022 Patient states MRI results, he feels okay   Relevant Historical Information: History of L5-S1 left-sided decompression  Additional pertinent review of systems negative.   Current Outpatient Medications:    aspirin 81 MG tablet, Take 1 tablet (81 mg total) by mouth at bedtime., Disp: 30 tablet, Rfl:    Cholecalciferol (VITAMIN D3) 125 MCG (5000 UT) CAPS, Take 5,000 Units by mouth at bedtime., Disp: , Rfl:    Coenzyme Q10 300 MG CAPS, Take 300 mg by mouth daily., Disp: , Rfl:    Cyanocobalamin (VITAMIN B-12) 2500 MCG SUBL, Place 1 tablet under the tongue. Taking 5 times a week, Disp: , Rfl:    fish oil-omega-3 fatty acids 1000 MG capsule, Take 1 g by mouth daily., Disp: , Rfl:    gabapentin (NEURONTIN) 100 MG capsule, TAKE 1 CAPSULE(100 MG) BY MOUTH TWICE DAILY, Disp: 180 capsule, Rfl: 1   Glucosamine-Chondroitin (MOVE FREE PO), Take 1 tablet by mouth daily., Disp: , Rfl:    meloxicam (MOBIC) 15 MG tablet, Take 1 tablet (15 mg total) by mouth daily., Disp: 30 tablet, Rfl: 0   metoprolol succinate (TOPROL-XL) 25 MG 24 hr tablet, TAKE 1 TABLET(25 MG) BY MOUTH DAILY, Disp: 90 tablet, Rfl: 0   Multiple Vitamin (MULTIVITAMIN) tablet, Take 1 tablet by mouth daily., Disp: , Rfl:  nitroGLYCERIN (NITROSTAT) 0.4 MG SL tablet, Place 1 tablet (0.4 mg total) under the tongue every 5 (five) minutes as needed for chest pain., Disp: 25 tablet, Rfl: 3   Probiotic Product (PROBIOTIC ADVANCED PO), Take 1 capsule by mouth daily., Disp: , Rfl:    simvastatin (ZOCOR) 40 MG tablet, TAKE 1 TABLET BY MOUTH DAILY AT 6:00 PM, Disp: 90 tablet, Rfl: 3   tamsulosin (FLOMAX) 0.4 MG CAPS capsule, TAKE 1 CAPSULE(0.4 MG) BY MOUTH DAILY, Disp: 90 capsule, Rfl: 2   Objective:     Vitals:   03/01/22 1358  Pulse: 93  SpO2: 99%  Weight: 200 lb (90.7  kg)  Height: '6\' 1"'$  (1.854 m)      Body mass index is 26.39 kg/m.    Physical Exam:    Gen: Appears well, nad, nontoxic and pleasant Psych: Alert and oriented, appropriate mood and affect Neuro: sensation intact, strength is 5/5 in upper and lower extremities, muscle tone wnl Skin: no susupicious lesions or rashes   Back - Normal skin, Spine with normal alignment and no deformity.   No tenderness to vertebral process palpation.   Paraspinous muscles are not tender and without spasm NTTP gluteal musculature Straight leg raise negative   Pain with lumbar extension and flexion    Electronically signed by:  Collin [name] D.[name] 2:58 PM 03/01/22

## 2022-03-01 NOTE — Patient Instructions (Addendum)
Good to see you  Epidural referral  Neurosurgery referral Dr.Jones Follow up with [place] 2 weeks after your injection if you decided to get it

## 2022-03-02 ENCOUNTER — Other Ambulatory Visit: Payer: Self-pay | Admitting: Family Medicine

## 2022-03-02 ENCOUNTER — Telehealth: Payer: Self-pay

## 2022-03-02 DIAGNOSIS — N401 Enlarged prostate with lower urinary tract symptoms: Secondary | ICD-10-CM

## 2022-03-02 NOTE — Progress Notes (Signed)
Patient ID: Collin [name], male   DOB: 1941/01/11, 82 y.o.   MRN: [id number]  Care Management & Coordination Services Pharmacy Team  Reason for Encounter: General adherence update/ Schedule with Pharm   What concerns Collin [name] you have about your medications? Patient reports he is not having any concerns or issues at present, was out of refills for a cardiology prescribed medication but states they have granted him fills. He is aware of his upcoming AWV with [name]. Scheduled with pharmacist for March  The patient denies side effects with their medications.     Chart Updates:  Recent office visits:  01/05/22 Collin [name], Collin [name], Collin [name] - Presented for routine general medical examination at a health care facility and other concerns. No medication changes.  Recent consult visits:  03/01/22 Collin [name], Collin [name] (Sports Med) - Patient presented for Chronic bilateral low back pain with bilateral sciatica and other concerns. No medication changes.  02/08/22 Collin [name], Collin [name] (Sports Med) - Patient presented for Chronic bilateral low back pain with bilateral sciatica and other concerns. No medication changes.  01/27/22 Collin [name], Collin [name] - Patient presented for Unilateral inguinal hernia without obstruction or gangrene recurrence not specified. No medication changes  01/07/22 Collin [name], Collin [name] (Sports Med) - Patient presented for Chronic bilateral low back pain with bilateral sciatica and other concerns. Prescribed Meloxicam.  Hospital visits:  None in previous 6 months  Medications: Outpatient Encounter Medications as of 03/02/2022  Medication Sig Note   aspirin 81 MG tablet Take 1 tablet (81 mg total) by mouth at bedtime.    Cholecalciferol (VITAMIN D3) 125 MCG (5000 UT) CAPS Take 5,000 Units by mouth at bedtime.    Coenzyme Q10 300 MG CAPS Take 300 mg by mouth daily.    Cyanocobalamin (VITAMIN B-12) 2500 MCG SUBL Place 1 tablet under the tongue. Taking 5 times a week    fish oil-omega-3  fatty acids 1000 MG capsule Take 1 [name] by mouth daily.    gabapentin (NEURONTIN) 100 MG capsule TAKE 1 CAPSULE(100 MG) BY MOUTH TWICE DAILY 09/01/2021: Once a day   Glucosamine-Chondroitin (MOVE FREE PO) Take 1 tablet by mouth daily.    meloxicam (MOBIC) 15 MG tablet Take 1 tablet (15 mg total) by mouth daily.    metoprolol succinate (TOPROL-XL) 25 MG 24 hr tablet TAKE 1 TABLET(25 MG) BY MOUTH DAILY    Multiple Vitamin (MULTIVITAMIN) tablet Take 1 tablet by mouth daily.    nitroGLYCERIN (NITROSTAT) 0.4 MG SL tablet Place 1 tablet (0.4 mg total) under the tongue every 5 (five) minutes as needed for chest pain.    Probiotic Product (PROBIOTIC ADVANCED PO) Take 1 capsule by mouth daily.    simvastatin (ZOCOR) 40 MG tablet TAKE 1 TABLET BY MOUTH DAILY AT 6:00 PM    tamsulosin (FLOMAX) 0.4 MG CAPS capsule TAKE 1 CAPSULE(0.4 MG) BY MOUTH DAILY    No facility-administered encounter medications on file as of 03/02/2022.    Recent vitals BP Readings from Last 3 Encounters:  02/08/22 122/78  01/07/22 120/78  01/05/22 110/70   Pulse Readings from Last 3 Encounters:  03/01/22 93  02/08/22 60  01/07/22 62   Wt Readings from Last 3 Encounters:  03/01/22 200 lb (90.7 kg)  02/08/22 201 lb (91.2 kg)  01/07/22 195 lb (88.5 kg)   BMI Readings from Last 3 Encounters:  03/01/22 26.39 kg/m  02/08/22 26.52 kg/m  01/07/22 25.73 kg/m    Recent lab results    Component Value Date/Time   NA  136 07/15/2021 1416   NA 138 01/16/2020 0922   K 4.3 07/15/2021 1416   CL 102 07/15/2021 1416   CO2 27 07/15/2021 1416   GLUCOSE 145 (H) 07/15/2021 1416   BUN 17 07/15/2021 1416   BUN 19 01/16/2020 0922   CREATININE 1.32 07/15/2021 1416   CALCIUM 9.2 07/15/2021 1416    Lab Results  Component Value Date   CREATININE 1.32 07/15/2021   GFR 50.05 (L) 07/15/2021   GFRNONAA 59 (L) 01/16/2020   GFRAA 68 01/16/2020   Lab Results  Component Value Date/Time   HGBA1C 5.7 01/05/2022 04:04 PM   HGBA1C 6.3  07/15/2021 02:16 PM   HGBA1C 6.3 12/03/2020 08:37 AM    Lab Results  Component Value Date   CHOL 122 12/03/2020   HDL 61.10 12/03/2020   LDLCALC 52 12/03/2020   TRIG 47.0 12/03/2020   CHOLHDL 2 12/03/2020    Care Gaps: COVID Booster - Overdue AWV- 03/03/21   Star Rating Drugs:  Simvastatin '40mg'$  - Last filled 09/08/21 90 DS at [facility] Pharmacist Assistant [phone]

## 2022-03-04 ENCOUNTER — Ambulatory Visit (INDEPENDENT_AMBULATORY_CARE_PROVIDER_SITE_OTHER): Payer: PPO | Admitting: Family Medicine

## 2022-03-04 DIAGNOSIS — Z0000 Encounter for general adult medical examination without abnormal findings: Secondary | ICD-10-CM

## 2022-03-04 NOTE — Patient Instructions (Addendum)
I really enjoyed getting to talk with you today! I am available on Tuesdays and Thursdays for virtual visits if you have any questions or concerns, or if I can be of any further assistance.   CHECKLIST FROM ANNUAL WELLNESS VISIT:  -Follow up (please call to schedule if not scheduled after visit):   -yearly for annual wellness visit with primary care office  Here is a list of your preventive care/health maintenance measures and the plan for each if any are due:  Health Maintenance  Topic Date Due   COVID-19 Vaccine (6 - 2023-24 season) 10/26/2022 (Originally 12/23/2021)   Medicare Annual Wellness ([place])  03/05/2023   Pneumonia Vaccine 61+ Years old  Completed   INFLUENZA VACCINE  Completed   Zoster Vaccines- Shingrix  Completed   HPV VACCINES  Aged Out   DTaP/Tdap/Td  Discontinued   COLONOSCOPY (Pts 45-20yr Insurance coverage will need to be confirmed)  Discontinued    -See a dentist at least yearly  -Get your eyes checked and then per your eye specialist's recommendations  -Other issues addressed today:  -consider the balance exercises - see below  -no more than 1-2 alcoholic beverages in a given 24 hour period  -I have included below further information regarding a healthy whole foods based diet, physical activity guidelines for adults, stress management and opportunities for social connections. I hope you find this information useful.     NUTRITION: -eat real food: lots of colorful vegetables (half the plate) and fruits -5-7 servings of vegetables and fruits per day (fresh or steamed is best), exp. 2 servings of vegetables with lunch and dinner and 2 servings of fruit per day. Berries and greens such as kale and collards are great  choices.  -consume on a regular basis: whole grains (make sure first ingredient on label contains the word "whole"), fresh fruits, fish, nuts, seeds, healthy oils (such as olive oil, avocado oil, grape seed oil) -may eat small amounts of dairy and lean meat on occasion, but avoid processed meats such as ham, bacon, lunch meat, etc. -drink water -try to avoid fast food and pre-packaged foods, processed meat -most experts advise limiting sodium to < '2300mg'$  per day, should limit further is any chronic conditions such as high blood pressure, heart disease, diabetes, etc. The American Heart Association advised that < '1500mg'$  is is ideal -try to avoid foods that contain any ingredients with names you do not recognize  -try to avoid sugar/sweets (except for the natural sugar that occurs in fresh fruit) -try to avoid sweet drinks -try to avoid white rice, white bread, pasta (unless whole grain), white or yellow potatoes  EXERCISE GUIDELINES FOR ADULTS: -if you wish to increase your physical activity, do so gradually and with the approval of your doctor -STOP and seek medical care immediately if you have any chest pain, chest discomfort or trouble breathing when starting or increasing exercise  -move and stretch your body, legs, feet and arms when sitting for long periods -Physical activity guidelines for optimal health in adults: -least 150 minutes per week of aerobic exercise (can talk, but not sing) once approved by your doctor, 20-30 minutes of sustained activity or two 10 minute episodes of sustained activity every day.  -resistance training at least 2 days per week if approved by your doctor -balance exercises 3+ days per week:   Stand somewhere where you have something sturdy to hold onto if you lose balance.    1) lift up on toes, start with 5x per  day and work up to 20x   2) stand and lift on leg straight out to the side so that foot is a few inches of the floor, start with 5x each side and work  up to 20x each side   3) stand on one foot, start with 5 seconds each side and work up to 20 seconds on each side

## 2022-03-04 NOTE — Progress Notes (Signed)
PATIENT CHECK-IN and HEALTH RISK ASSESSMENT QUESTIONNAIRE:  -completed by phone/video for upcoming Medicare Preventive Visit  Pre-Visit Check-in: 1)Vitals (height, wt, BP, etc) - record in vitals section for visit on day of visit 2)Review and Update Medications, Allergies PMH, Surgeries, Social history in Epic 3)Hospitalizations in the last year with date/reason?  No 4)Review and Update Care Team (patient's specialists) in Epic 5) Complete PHQ9 in Epic  6) Complete Fall Screening in Epic 7)Review all Health Maintenance Due and order under PCP if not done.  Medicare Wellness Patient Questionnaire:  Answer theses question about your habits: Do you drink alcohol? Wine   If yes, how many drinks do you have a day?1 glass 1 day per week Have you ever smoked?No  Quit date if applicable?  N/A How many packs a day do/did you smoke? N/A Do you use smokeless tobacco?No Do you use an illicit drugs?No Do you exercises? Yes, IF so, what type and how many days/minutes per week? Gym, M-W-F for 60-90 minutes, also doing some physical therapy exercises several days per week. Walks 3 miles every Saturday.  Are you sexually active? No  Number of partners? 0 He is eating a lot of vegetables - cooks at home some and eats out some Typical breakfast: Cereal, oatmeal Typical lunch: Peas, cabbage,salmon, soup Typical dinner: Steak, pasta Typical snacks: Potato chips once a week, some chocolate  Beverages: Water  Answer theses question about you: Can you perform most household chores?yes Do you find it hard to follow a conversation in a noisy room?Yes Do you often ask people to speak up or repeat themselves?Yes, patient has hearing aid Do you feel that you have a problem with memory?Yes, reports has had an evaluation with neurology for this Do you balance your checkbook and or bank acounts?Yes Do you feel safe at home?Yes Last dentist visit? Jan 2024 Do you need assistance with any of the following:  Please note if so No, patient doesn't drive at night  Driving?  Feeding yourself?  Getting from bed to chair?  Getting to the toilet?  Bathing or showering?  Dressing yourself?  Managing money?  Climbing a flight of stairs  Preparing meals?    Do you have Advanced Directives in place (Living Will, Healthcare Power or Attorney)? Yes   Last eye Exam and location? June 2023, [place]    Do you currently use prescribed or non-prescribed narcotic or opioid pain medications?No  Do you have a history or close family history of breast, ovarian, tubal or peritoneal cancer or a family member with BRCA (breast cancer susceptibility 1 and 2) gene mutations?  Nurse/Assistant Credentials/time stamp:   ----------------------------------------------------------------------------------------------------------------------------------------------------------------------------------------------------------------------    MEDICARE ANNUAL PREVENTIVE CARE VISIT WITH PROVIDER (Welcome to [employer], initial annual wellness or annual wellness exam)  Virtual Visit via Phone Note  I connected with Collin [name]  on 03/04/22 by phone and verified that I am speaking with the correct person using two identifiers.  Location patient: home Location provider:work or home office Persons participating in the virtual visit: patient, provider  Concerns and/or follow up today: Reports is doing ok. He is seeing Dr. Glennon Mac in sports medicine for back issues. Had an MRI. Is considering injections and is going to see an orthopedic doc about this.    See HM section in Epic for other details of completed HM.    ROS: negative for report of fevers, unintentional weight loss, vision changes, vision loss, hearing loss or change, chest pain, sob, hemoptysis, melena, hematochezia, hematuria, genital discharge  or lesions, falls, bleeding or bruising, loc, thoughts of suicide or self harm, memory loss  Patient-completed  extensive health risk assessment - reviewed and discussed with the patient: See Health Risk Assessment completed with patient prior to the visit either above or in recent phone note. This was reviewed in detailed with the patient today and appropriate recommendations, orders and referrals were placed as needed per Summary below and patient instructions.   Review of Medical History: -PMH, PSH, Family History and current specialty and care providers reviewed and updated and listed below   Patient Care Team: [place], Betty G, MD as PCP - General (Family Medicine) Sherren Mocha, MD as PCP - Cardiology (Cardiology) Viona Gilmore, [facility] (Inactive) as Pharmacist (Pharmacist)   Past Medical History:  Diagnosis Date   Allergy    Arthritis    BPH (benign prostatic hyperplasia)    CAD (coronary artery disease)    Cataract    Diverticulosis of colon    Hx of adenomatous colonic polyps 05/22/2014   Hyperlipidemia    Hypertension    MYOCARDIAL INFARCTION, HX OF 07/20/2006   Qualifier: Diagnosis of  By: Leanne Chang MD, Bruce      Past Surgical History:  Procedure Laterality Date   CATARACT EXTRACTION Bilateral    Bil   COLONOSCOPY     LUMBAR LAMINECTOMY/DECOMPRESSION MICRODISCECTOMY Left 01/20/2018   Procedure: Left Lumbar five Sacral one extraforaminal decompression;  Surgeon: Eustace Moore, MD;  Location: [place];  Service: Neurosurgery;  Laterality: Left;   PTCA      Social History   Socioeconomic History   Marital status: Married    Spouse name: Not on file   Number of children: 3   Years of education: 16   Highest education level: Not on file  Occupational History   Occupation: retired  Tobacco Use   Smoking status: Never   Smokeless tobacco: Never  Vaping Use   Vaping Use: Never used  Substance and Sexual Activity   Alcohol use: Yes    Comment: wine once a week and beer once a week   Drug use: No   Sexual activity: Not on file  Other Topics Concern   Not on file  Social  History Narrative   Lives with wife in a 2 story home.  Has 3 children.     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    Education: college.    Occasional beer, no smoking, no drugs   Social Determinants of Health   Financial Resource Strain: Low Risk  (10/16/2021)   Overall Financial Resource Strain (CARDIA)    Difficulty of Paying Living Expenses: Not hard at all  Food Insecurity: No Food Insecurity (03/03/2021)   Hunger Vital Sign    Worried About Running Out of Food in the Last Year: Never true    Ran Out of Food in the Last Year: Never true  Transportation Needs: No Transportation Needs (03/03/2021)   PRAPARE - Hydrologist (Medical): No    Lack of Transportation (Non-Medical): No  Physical Activity: Sufficiently Active (03/03/2021)   Exercise Vital Sign    Days of Exercise per Week: 6 days    Minutes of Exercise per Session: 90 min  Stress: No Stress Concern Present (03/03/2021)   [place]    Feeling of Stress : Not at all  Social Connections: Moderately Integrated (01/03/2020)   Social Connection and Isolation Panel [NHANES]  Frequency of Communication with Friends and Family: More than three times a week    Frequency of Social Gatherings with Friends and Family: Three times a week    Attends Religious Services: More than 4 times per year    Active Member of Clubs or Organizations: No    Attends Archivist Meetings: Never    Marital Status: Married  Human resources officer Violence: Not At Risk (01/03/2020)   Humiliation, Afraid, Rape, and Kick questionnaire    Fear of Current or Ex-Partner: No    Emotionally Abused: No    Physically Abused: No    Sexually Abused: No    Family History  Problem Relation Age of Onset   Heart disease Mother        CABG   Dementia Mother 74   Stroke Father    Heart disease Father    Kidney disease Father        renal failure    AAA (abdominal aortic aneurysm) Father    Hypertension Sister    Diabetes Brother    AAA (abdominal aortic aneurysm) Paternal Uncle    Breast cancer Daughter    Colon cancer Neg Hx     Current Outpatient Medications on File Prior to Visit  Medication Sig Dispense Refill   aspirin 81 MG tablet Take 1 tablet (81 mg total) by mouth at bedtime. 30 tablet    Cholecalciferol (VITAMIN D3) 125 MCG (5000 UT) CAPS Take 5,000 Units by mouth at bedtime.     Coenzyme Q10 300 MG CAPS Take 300 mg by mouth daily.     Cyanocobalamin (VITAMIN B-12) 2500 MCG SUBL Place 1 tablet under the tongue. Taking 5 times a week     fish oil-omega-3 fatty acids 1000 MG capsule Take 1 g by mouth daily.     gabapentin (NEURONTIN) 100 MG capsule TAKE 1 CAPSULE(100 MG) BY MOUTH TWICE DAILY 180 capsule 1   Glucosamine-Chondroitin (MOVE FREE PO) Take 1 tablet by mouth daily.     meloxicam (MOBIC) 15 MG tablet Take 1 tablet (15 mg total) by mouth daily. 30 tablet 0   metoprolol succinate (TOPROL-XL) 25 MG 24 hr tablet TAKE 1 TABLET(25 MG) BY MOUTH DAILY 90 tablet 0   Multiple Vitamin (MULTIVITAMIN) tablet Take 1 tablet by mouth daily.     nitroGLYCERIN (NITROSTAT) 0.4 MG SL tablet Place 1 tablet (0.4 mg total) under the tongue every 5 (five) minutes as needed for chest pain. 25 tablet 3   Probiotic Product (PROBIOTIC ADVANCED PO) Take 1 capsule by mouth daily.     simvastatin (ZOCOR) 40 MG tablet TAKE 1 TABLET BY MOUTH DAILY AT 6:00 PM 90 tablet 3   tamsulosin (FLOMAX) 0.4 MG CAPS capsule TAKE 1 CAPSULE(0.4 MG) BY MOUTH DAILY 90 capsule 2   No current facility-administered medications on file prior to visit.    Allergies  Allergen Reactions   Penicillins Rash and Other (See Comments)    PATIENT HAS HAD A PCN REACTION WITH IMMEDIATE RASH, FACIAL/TONGUE/THROAT SWELLING, SOB, OR LIGHTHEADEDNESS WITH HYPOTENSION:  #  #  YES  #  #  Has patient had a PCN reaction causing severe rash involving mucus membranes or skin  necrosis: No Has patient had a PCN reaction that required hospitalization: No Has patient had a PCN reaction occurring within the last 10 years: No If all of the above answers are "NO", then may proceed with Cephalosporin use.       Physical Exam There were no vitals filed for  this visit. Estimated body mass index is 26.39 kg/m as calculated from the following:   Height as of 03/01/22: '6\' 1"'$  (1.854 m).   Weight as of 03/01/22: 200 lb (90.7 kg).  EKG (optional): deferred due to virtual visit  GENERAL: alert, oriented, no acute distress detected; full vision exam deferred due to pandemic and/or virtual encounter  PSYCH/NEURO: pleasant and cooperative, no obvious depression or anxiety, speech and thought processing grossly intact, Cognitive function grossly intact  [facility] Visit from 03/04/2022 in [facility] [place]  PHQ-9 Total Score 1           03/04/2022    3:54 PM 01/05/2022    3:02 PM 07/15/2021   11:03 AM 03/03/2021    4:05 PM 12/10/2020    7:54 PM  Depression screen PHQ 2/9  Decreased Interest 0 0 0 0 0  Down, Depressed, Hopeless 0 0 0 0 0  PHQ - 2 Score 0 0 0 0 0  Altered sleeping 0      Tired, decreased energy 0      Change in appetite 0      Feeling bad or failure about yourself  0      Trouble concentrating 1      Moving slowly or fidgety/restless 0      Suicidal thoughts 0      PHQ-9 Score 1      Difficult doing work/chores Not difficult at all           03/03/2021    4:04 PM 07/15/2021   11:03 AM 09/01/2021    1:16 PM 01/05/2022    3:02 PM 03/04/2022    3:53 PM  Fall Risk  Falls in the past year? 0 0 0 0 0  Was there an injury with Fall?  0 0 0 0  Fall Risk Category Calculator  0 0 0 0  Fall Risk Category (Retired)  Low Low Low   (RETIRED) Patient Fall Risk Level Moderate fall risk Moderate fall risk Low fall risk Low fall risk   Patient at Risk for Falls Due to Impaired balance/gait;Impaired mobility;Medication side effect  History of fall(s)  Other (Comment)   Fall risk Follow up Falls evaluation completed;Education provided;Falls prevention discussed Falls evaluation completed  Falls evaluation completed      SUMMARY AND PLAN:  Encounter for Medicare annual wellness exam   Discussed applicable health maintenance/preventive health measures and advised and referred or ordered per patient preferences:  Health Maintenance  Topic Date Due   COVID-19 Vaccine (6 - 2023-24 season) 10/26/2022 (Originally 12/23/2021)   Medicare Annual Wellness ([place])  03/05/2023   Pneumonia Vaccine 77+ Years old  Completed   INFLUENZA VACCINE  Completed   Zoster Vaccines- Shingrix  Completed   HPV VACCINES  Aged Out   DTaP/Tdap/Td  Discontinued   COLONOSCOPY (Pts 45-2yr Insurance coverage will need to be confirmed)  Discontinued   Education and counseling on the following was provided based on the above review of health and a plan/checklist for the patient, along with additional information discussed, was provided for the patient in the patient instructions :   -Advised and counseled on maintaining healthy weight and healthy lifestyle - including the importance of a health diet, regular physical activity, social connections and stress management. -Advised and counseled on a whole foods based healthy diet and regular exercise: discussed a heart healthy whole foods based diet at length. A summary of a healthy diet was provided in the Patient  Instructions. Recommended continued regular exercise. He was interested in balance exercises - provided.  -Advise yearly dental visits at minimum and regular eye exams -Advise no more than 1-2 alcohol drink per day.  Follow up: see patient instructions   Patient Instructions  I really enjoyed getting to talk with you today! I am available on Tuesdays and Thursdays for virtual visits if you have any questions or concerns, or if I can be of any further assistance.   CHECKLIST FROM ANNUAL  WELLNESS VISIT:  -Follow up (please call to schedule if not scheduled after visit):   -yearly for annual wellness visit with primary care office  Here is a list of your preventive care/health maintenance measures and the plan for each if any are due:  Health Maintenance  Topic Date Due   COVID-19 Vaccine (6 - 2023-24 season) 10/26/2022 (Originally 12/23/2021)   Medicare Annual Wellness ([place])  03/05/2023   Pneumonia Vaccine 70+ Years old  Completed   INFLUENZA VACCINE  Completed   Zoster Vaccines- Shingrix  Completed   HPV VACCINES  Aged Out   DTaP/Tdap/Td  Discontinued   COLONOSCOPY (Pts 45-31yr Insurance coverage will need to be confirmed)  Discontinued    -See a dentist at least yearly  -Get your eyes checked and then per your eye specialist's recommendations  -Other issues addressed today:  -consider the balance exercises - see below  -no more than 1-2 alcoholic beverages in a given 24 hour period  -I have included below further information regarding a healthy whole foods based diet, physical activity guidelines for adults, stress management and opportunities for social connections. I hope you find this information useful.     NUTRITION: -eat real food: lots of colorful vegetables (half the plate) and fruits -5-7 servings of vegetables and fruits per day (fresh or steamed is best), exp. 2 servings of vegetables with lunch and dinner and 2 servings of fruit per day. Berries and greens such as kale and collards are great choices.  -consume on a regular basis: whole grains (make sure first ingredient on label contains the word "whole"), fresh fruits, fish, nuts, seeds, healthy oils (such as olive oil, avocado oil, grape seed  oil) -may eat small amounts of dairy and lean meat on occasion, but avoid processed meats such as ham, bacon, lunch meat, etc. -drink water -try to avoid fast food and pre-packaged foods, processed meat -most experts advise limiting sodium to < '2300mg'$  per day, should limit further is any chronic conditions such as high blood pressure, heart disease, diabetes, etc. The American Heart Association advised that < '1500mg'$  is is ideal -try to avoid foods that contain any ingredients with names you do not recognize  -try to avoid sugar/sweets (except for the natural sugar that occurs in fresh fruit) -try to avoid sweet drinks -try to avoid white rice, white bread, pasta (unless whole grain), white or yellow potatoes  EXERCISE GUIDELINES FOR ADULTS: -if you wish to increase your physical activity, do so gradually and with the approval of your doctor -STOP and seek medical care immediately if you have any chest pain, chest discomfort or trouble breathing when starting or increasing exercise  -move and stretch your body, legs, feet and arms when sitting for long periods -Physical activity guidelines for optimal health in adults: -least 150 minutes per week of aerobic exercise (can talk, but not sing) once approved by your doctor, 20-30 minutes of sustained activity or two 10 minute episodes of sustained activity every day.  -resistance training  at least 2 days per week if approved by your doctor -balance exercises 3+ days per week:   Stand somewhere where you have something sturdy to hold onto if you lose balance.    1) lift up on toes, start with 5x per day and work up to 20x   2) stand and lift on leg straight out to the side so that foot is a few inches of the floor, start with 5x each side and work up to 20x each side   3) stand on one foot, start with 5 seconds each side and work up to 20 seconds on each side              Lucretia Kern, DO

## 2022-03-05 ENCOUNTER — Other Ambulatory Visit: Payer: Self-pay | Admitting: Cardiovascular Disease

## 2022-03-08 ENCOUNTER — Ambulatory Visit: Payer: PPO

## 2022-03-16 DIAGNOSIS — M5416 Radiculopathy, lumbar region: Secondary | ICD-10-CM | POA: Diagnosis not present

## 2022-03-16 DIAGNOSIS — Z6825 Body mass index (BMI) 25.0-25.9, adult: Secondary | ICD-10-CM | POA: Diagnosis not present

## 2022-03-18 ENCOUNTER — Other Ambulatory Visit: Payer: Self-pay | Admitting: Student

## 2022-03-18 DIAGNOSIS — M5416 Radiculopathy, lumbar region: Secondary | ICD-10-CM

## 2022-03-22 ENCOUNTER — Ambulatory Visit
Admission: RE | Admit: 2022-03-22 | Discharge: 2022-03-22 | Disposition: A | Discharge status: Home / Self Care | Payer: PPO | Source: Ambulatory Visit | Attending: Student | Admitting: Student

## 2022-03-22 DIAGNOSIS — M4727 Other spondylosis with radiculopathy, lumbosacral region: Secondary | ICD-10-CM | POA: Diagnosis not present

## 2022-03-22 DIAGNOSIS — M5416 Radiculopathy, lumbar region: Secondary | ICD-10-CM

## 2022-03-22 MED ORDER — METHYLPREDNISOLONE ACETATE 40 MG/ML INJ SUSP (RADIOLOG
80.0000 mg | Freq: Once | INTRAMUSCULAR | Status: AC
  Administered 2022-03-22: 80 mg via EPIDURAL

## 2022-03-22 MED ORDER — IOPAMIDOL (ISOVUE-M 200) INJECTION 41%
1.0000 mL | Freq: Once | INTRAMUSCULAR | Status: AC
  Administered 2022-03-22: 1 mL via EPIDURAL

## 2022-03-22 NOTE — Discharge Instructions (Signed)

## 2022-03-30 ENCOUNTER — Encounter: Payer: Self-pay | Admitting: Physical Therapy

## 2022-03-30 ENCOUNTER — Ambulatory Visit: Payer: PPO | Attending: Sports Medicine | Admitting: Physical Therapy

## 2022-03-30 VITALS — BP 144/91 | HR 105

## 2022-03-30 DIAGNOSIS — R269 Unspecified abnormalities of gait and mobility: Secondary | ICD-10-CM | POA: Diagnosis not present

## 2022-03-30 DIAGNOSIS — M21372 Foot drop, left foot: Secondary | ICD-10-CM | POA: Diagnosis not present

## 2022-03-30 DIAGNOSIS — G8929 Other chronic pain: Secondary | ICD-10-CM | POA: Insufficient documentation

## 2022-03-30 DIAGNOSIS — R2681 Unsteadiness on feet: Secondary | ICD-10-CM | POA: Diagnosis not present

## 2022-03-30 DIAGNOSIS — M5442 Lumbago with sciatica, left side: Secondary | ICD-10-CM | POA: Insufficient documentation

## 2022-03-30 DIAGNOSIS — M25511 Pain in right shoulder: Secondary | ICD-10-CM | POA: Insufficient documentation

## 2022-03-30 DIAGNOSIS — M5441 Lumbago with sciatica, right side: Secondary | ICD-10-CM | POA: Insufficient documentation

## 2022-03-30 NOTE — Therapy (Unsigned)
OUTPATIENT PHYSICAL THERAPY NEURO EVALUATION   Patient Name: Collin [name] MRN: [id number] DOB:Jun 02, 1940, 82 y.o., male Today's Date: 04/01/2022  PCP: Betty Martinique, MD  REFERRING PROVIDER: Glennon Mac, DO  END OF SESSION:    Past Medical History:  Diagnosis Date   Allergy    Arthritis    BPH (benign prostatic hyperplasia)    CAD (coronary artery disease)    Cataract    Diverticulosis of colon    Hx of adenomatous colonic polyps 05/22/2014   Hyperlipidemia    Hypertension    MYOCARDIAL INFARCTION, HX OF 07/20/2006   Qualifier: Diagnosis of  By: Leanne Chang MD, Bruce     Past Surgical History:  Procedure Laterality Date   CATARACT EXTRACTION Bilateral    Bil   COLONOSCOPY     LUMBAR LAMINECTOMY/DECOMPRESSION MICRODISCECTOMY Left 01/20/2018   Procedure: Left Lumbar five Sacral one extraforaminal decompression;  Surgeon: Eustace Moore, MD;  Location: [facility];  Service: Neurosurgery;  Laterality: Left;   PTCA     Patient Active Problem List   Diagnosis Date Noted   Right shoulder pain 01/05/2022   Routine general medical examination at a health care facility 01/05/2022   Prediabetes 01/05/2022   S/P lumbar laminectomy 01/20/2018   Essential tremor 11/02/2017   Unstable gait 11/02/2017   Lumbar radiculopathy 07/19/2017   Peripheral neuropathy 12/06/2016   Hx of adenomatous colonic polyps 05/22/2014   Left inguinal hernia 09/10/2011   PSA, INCREASED 08/05/2009   CONGENITAL PIGMENTARY ANOMALY OF SKIN 07/25/2007   Dyslipidemia 07/20/2006   Coronary atherosclerosis 07/20/2006   BPH associated with nocturia 07/20/2006    ONSET DATE: 02/08/2022 (referral date)  REFERRING DIAG: M54.42,M54.41,G89.29 (ICD-10-CM) - Chronic bilateral low back pain with bilateral sciatica M51.36 (ICD-10-CM) - DDD (degenerative disc disease), lumbar M25.511,G89.29 (ICD-10-CM) - Chronic right shoulder pain M19.011 (ICD-10-CM) - Primary osteoarthritis of right shoulder  THERAPY DIAG:   Abnormality of gait and mobility - Plan: PT plan of care cert/re-cert  Unsteadiness on feet - Plan: PT plan of care cert/re-cert  Foot drop, left - Plan: PT plan of care cert/re-cert  Rationale for Evaluation and Treatment: Rehabilitation  SUBJECTIVE:                                                                                                                                                                                             SUBJECTIVE STATEMENT: Patient reports that he was told to come to physical therapy for his "balance." Patient reports no major complaints pertaining to lumbar/shoulder pain at this time. He went to a neurologist about 6 months ago and was told that he didn't have  Parkinson's. He has noticed challenges with his balance especially first thing in the morning; he has to shuffle his feet before he moves. Patient feels like his mind is not getting the message to his feet. He was told that he was diagnosed also diagnosed with a "cognitive problem" about 4 years ago, which causese difficulty with word finding. Patient noticed back pain about 4 years ago and was diagnosed with bulging disc at L5 and had a surgery to address in December 2019. Since then, he has noticed left leg weakness on the L side. All problems are on the L side per patient. Patient had a shot two weeks ago for his back pain and has not had pain since then. Patient notices both feet catching sometimes.   Pt accompanied by: self  PERTINENT HISTORY: lumbar stenosis with L foot drop, lumbar laminectomy 2019  PAIN:  Are you having pain? No  PRECAUTIONS: Fall  WEIGHT BEARING RESTRICTIONS: No  FALLS: Has patient fallen in last 6 months?  Reports approximately 10-12 stumbles in the last 6 months  LIVING ENVIRONMENT: Lives with: lives with their spouse Lives in: House/apartment Stairs: Yes: Internal: 16 steps; on right going up and External: 3 steps; bilateral but cannot reach both Has following  equipment at home: None  PLOF: Independent  PATIENT GOALS: "To get where I got balance like I used to have."  OBJECTIVE:   DIAGNOSTIC FINDINGS:   IMPRESSION 2/2/224: 1. Worsened moderate spinal canal stenosis and severe bilateral neural foraminal stenosis at L3-L4 due to combination of disc bulge and severe facet arthrosis. 2. Unchanged severe right and moderate left L4-5 neural foraminal stenosis. 3. Unchanged severe left L5-S1 neural foraminal stenosis.  COGNITION: Overall cognitive status: Impaired - mild word finding exercises   SENSATION: Appears to have reduced sensation around bilateral medial malleoli   COORDINATION: WFL  EDEMA:  WFL  MUSCLE TONE: WFL with exception of ankle mobility which is low tone on L  POSTURE: rounded shoulders and forward head  LOWER EXTREMITY ROM:     Mild tightness noted in bilateral LE, reduced AROM in L DF through 50% ROM  LOWER EXTREMITY MMT:    MMT Right Eval Left Eval  Hip flexion 4+/5 4+/5  Hip extension    Hip abduction    Hip adduction    Hip internal rotation    Hip external rotation    Knee flexion 4/5 4/5  Knee extension 4/5 4/5  Ankle dorsiflexion 4-/5 3-/5  Ankle plantarflexion    Ankle inversion    Ankle eversion    (Blank rows = not tested)  TRANSFERS: Assistive device utilized: None  Sit to stand: SBA Stand to sit: SBA Chair to chair: SBA  GAIT: Gait pattern: wide BOS, poor foot clearance- Right, and poor foot clearance- Left, unsteady and foot slap on LLE  Distance walked: 2 x 40' Assistive device utilized: None Level of assistance: SBA  FUNCTIONAL TESTS:  5 times sit to stand: 15.42" without UE support with some signs of imbalance when coming to stand Timed up and go (TUG): 11.34" without AD (SBA) 10 meter walk test: 0.98 m/s without AD FGA: To be assessed  PATIENT SURVEYS:  ABC scale 69.75%  VITALS: Vitals:   03/30/22 1255  BP: (!) 144/91  Pulse: (!) 105     TODAY'S TREATMENT:  Initial eval only   PATIENT EDUCATION: Education details: POC, examination findings, results, goal collaboration Person educated: Patient Education method: Explanation Education comprehension: verbalized understanding and needs further education  HOME EXERCISE PROGRAM: To be provided  GOALS: Goals reviewed with patient? Yes  LONG TERM GOALS: Target date: 05/11/2022 (STG = LTG due to POC length)  Patient will report demonstrate independence with final HEP in order to maintain current gains and continue to progress after physical therapy discharge.   Baseline: To be provided Goal status: INITIAL  2. Patient will improve gait speed to 1.1 m/s or greater to indicate a reduced risk for falls.   Baseline:  Goal status: INITIAL  3.  FGA goal to be assessed and goal written as indicated Baseline: To be assessed Goal status: INITIAL  4.  Patient will improve their 5x Sit to Stand score to less than 13 seconds to demonstrate a decreased risk for falls and improved LE strength.   Baseline: 15.42" without UE support with some signs of imbalance Goal status: INITIAL  5.  SOT to be assessed/goal written as indicated Baseline: To be assessed Goal status: INITIAL   ASSESSMENT:  CLINICAL IMPRESSION: Patient is a 82 y.o. male who was seen today for physical therapy evaluation and treatment for balance deficits with PMH of stenosis and lumbar laminectomy. Patient presents with L foot drop likely due to lumbar involvement impairing balance/gait. Patient is at an increased risk for falls as indicated by 10 MWT and Five time sit to stand scores though not far from the cut offs. Patient also demonstrates impairment AROM and generalized weakness which also are impacting function. Patient will benefit from skilled physical therapy services to improve balance and general mobility  to reduce risk for falls in the future.   OBJECTIVE IMPAIRMENTS: Abnormal gait, decreased balance, decreased mobility, difficulty walking, decreased ROM, and decreased strength.   ACTIVITY LIMITATIONS: bending, standing, and transfers  PARTICIPATION LIMITATIONS: community activity and yard work  PERSONAL FACTORS: Age, Time since onset of injury/illness/exacerbation, and 3+ comorbidities: see above/medical chart for details  are also affecting patient's functional outcome.   REHAB POTENTIAL: Good  CLINICAL DECISION MAKING: Evolving/moderate complexity  EVALUATION COMPLEXITY: Moderate  PLAN:  PT FREQUENCY: 1x/week  PT DURATION: 6 weeks  PLANNED INTERVENTIONS: Therapeutic exercises, Therapeutic activity, Neuromuscular re-education, Balance training, Gait training, Patient/Family education, Self Care, Joint mobilization, Orthotic/Fit training, DME instructions, and Re-evaluation  PLAN FOR NEXT SESSION: FGA/SOT to be assessed and write goal, consider possible trial of AFO or [place] step brace or similar, proximal stability, higher level balance training  Esperanza Heir, PT, DPT 04/01/2022, 12:57 PM

## 2022-04-01 ENCOUNTER — Ambulatory Visit: Payer: PPO | Admitting: Physical Therapy

## 2022-04-05 ENCOUNTER — Encounter: Payer: Self-pay | Admitting: Physical Therapy

## 2022-04-05 ENCOUNTER — Ambulatory Visit: Payer: PPO | Admitting: Physical Therapy

## 2022-04-05 VITALS — BP 126/64 | HR 61

## 2022-04-05 DIAGNOSIS — M21372 Foot drop, left foot: Secondary | ICD-10-CM

## 2022-04-05 DIAGNOSIS — R269 Unspecified abnormalities of gait and mobility: Secondary | ICD-10-CM | POA: Diagnosis not present

## 2022-04-05 DIAGNOSIS — R2681 Unsteadiness on feet: Secondary | ICD-10-CM

## 2022-04-05 NOTE — Therapy (Signed)
OUTPATIENT PHYSICAL THERAPY NEURO TREATMENT   Patient Name: Collin [name] MRN: [id number] DOB:19-Feb-1940, 82 y.o., male Today's Date: 04/05/2022  PCP: Betty Martinique, MD  REFERRING PROVIDER: Glennon Mac, DO  END OF SESSION:  PT End of Session - 04/05/22 1103     Visit Number 2    Number of Visits 7    Date for PT Re-Evaluation 05/27/22    Authorization Type [employer]    PT Start Time 1101    PT Stop Time 1146    PT Time Calculation (min) 45 min    Equipment Utilized During Treatment Gait belt    Activity Tolerance Patient tolerated treatment well    Behavior During Therapy WFL for tasks assessed/performed              Past Medical History:  Diagnosis Date   Allergy    Arthritis    BPH (benign prostatic hyperplasia)    CAD (coronary artery disease)    Cataract    Diverticulosis of colon    Hx of adenomatous colonic polyps 05/22/2014   Hyperlipidemia    Hypertension    MYOCARDIAL INFARCTION, HX OF 07/20/2006   Qualifier: Diagnosis of  By: Leanne Chang MD, Bruce     Past Surgical History:  Procedure Laterality Date   CATARACT EXTRACTION Bilateral    Bil   COLONOSCOPY     LUMBAR LAMINECTOMY/DECOMPRESSION MICRODISCECTOMY Left 01/20/2018   Procedure: Left Lumbar five Sacral one extraforaminal decompression;  Surgeon: Eustace Moore, MD;  Location: [facility];  Service: Neurosurgery;  Laterality: Left;   PTCA     Patient Active Problem List   Diagnosis Date Noted   Right shoulder pain 01/05/2022   Routine general medical examination at a health care facility 01/05/2022   Prediabetes 01/05/2022   S/P lumbar laminectomy 01/20/2018   Essential tremor 11/02/2017   Unstable gait 11/02/2017   Lumbar radiculopathy 07/19/2017   Peripheral neuropathy 12/06/2016   Hx of adenomatous colonic polyps 05/22/2014   Left inguinal hernia 09/10/2011   PSA, INCREASED 08/05/2009   CONGENITAL PIGMENTARY ANOMALY OF SKIN 07/25/2007   Dyslipidemia 07/20/2006   Coronary  atherosclerosis 07/20/2006   BPH associated with nocturia 07/20/2006    ONSET DATE: 02/08/2022 (referral date)  REFERRING DIAG: M54.42,M54.41,G89.29 (ICD-10-CM) - Chronic bilateral low back pain with bilateral sciatica M51.36 (ICD-10-CM) - DDD (degenerative disc disease), lumbar M25.511,G89.29 (ICD-10-CM) - Chronic right shoulder pain M19.011 (ICD-10-CM) - Primary osteoarthritis of right shoulder  THERAPY DIAG:  Abnormality of gait and mobility  Foot drop, left  Unsteadiness on feet  Rationale for Evaluation and Treatment: Rehabilitation  SUBJECTIVE:  SUBJECTIVE STATEMENT: Patient is hoping to schedule only 1-2 weeks at a time due to his schedule. Reports no falls/near falls. Patient reports that he thought is legs were strong but is unsure what is causing him to feel off balance and is hoping to find out.   Pt accompanied by: self  PERTINENT HISTORY: lumbar stenosis with L foot drop, lumbar laminectomy 2019  PAIN:  Are you having pain? No  PRECAUTIONS: Fall  WEIGHT BEARING RESTRICTIONS: No  FALLS: Has patient fallen in last 6 months?  Reports approximately 10-12 stumbles in the last 6 months  LIVING ENVIRONMENT: Lives with: lives with their spouse Lives in: House/apartment Stairs: Yes: Internal: 16 steps; on right going up and External: 3 steps; bilateral but cannot reach both Has following equipment at home: None  PLOF: Independent  PATIENT GOALS: "To get where I got balance like I used to have."  OBJECTIVE:   DIAGNOSTIC FINDINGS:   IMPRESSION 2/2/224: 1. Worsened moderate spinal canal stenosis and severe bilateral neural foraminal stenosis at L3-L4 due to combination of disc bulge and severe facet arthrosis. 2. Unchanged severe right and moderate left L4-5 neural  foraminal stenosis. 3. Unchanged severe left L5-S1 neural foraminal stenosis.  COGNITION: Overall cognitive status: Impaired - mild word finding exercises   VITALS: Vitals:   04/05/22 1114  BP: 126/64  Pulse: 61     TODAY'S TREATMENT:                                                                                                                               TherAct:  [facility] PT Assessment - 04/05/22 0001       Functional Gait  Assessment   Gait assessed  Yes    Gait Level Surface Walks 20 ft, slow speed, abnormal gait pattern, evidence for imbalance or deviates 10-15 in outside of the 12 in walkway width. Requires more than 7 sec to ambulate 20 ft.    Change in Gait Speed Able to change speed, demonstrates mild gait deviations, deviates 6-10 in outside of the 12 in walkway width, or no gait deviations, unable to achieve a major change in velocity, or uses a change in velocity, or uses an assistive device.    Gait with Horizontal Head Turns Performs head turns smoothly with slight change in gait velocity (eg, minor disruption to smooth gait path), deviates 6-10 in outside 12 in walkway width, or uses an assistive device.    Gait with Vertical Head Turns Performs task with slight change in gait velocity (eg, minor disruption to smooth gait path), deviates 6 - 10 in outside 12 in walkway width or uses assistive device    Gait and Pivot Turn Pivot turns safely in greater than 3 sec and stops with no loss of balance, or pivot turns safely within 3 sec and stops with mild imbalance, requires small steps to catch balance.    Step Over Obstacle Is able to step over one  shoe box (4.5 in total height) but must slow down and adjust steps to clear box safely. May require verbal cueing.    Gait with Narrow Base of Support Ambulates less than 4 steps heel to toe or cannot perform without assistance.    Gait with Eyes Closed Walks 20 ft, uses assistive device, slower speed, mild gait deviations, deviates  6-10 in outside 12 in walkway width. Ambulates 20 ft in less than 9 sec but greater than 7 sec.    Ambulating Backwards Walks 20 ft, slow speed, abnormal gait pattern, evidence for imbalance, deviates 10-15 in outside 12 in walkway width.    Steps Alternating feet, must use rail.    Total Score 15             Monofilament testing   Impaired/absent sensation from mid shin down with largest 200 monofilament L > R  Foot up brace trial: Patient demonstrates signficant foot slap on the L with WBOS and intermittent forefoot contact on the R as well. L trendelenburg and possible L knee thrust but unclear given pants. Feet have bilateral very high arches and hammer toe posturing bilaterally. Trialed foot up on the L foot and improved heel contact and foot rockers through stance. Patient reported feeling increased stability and liked bracing. Continue to assess. Ambulated with donned 1 x 230' with SBA-CGA.  PATIENT EDUCATION: Education details: monofilament results, impaired sensation, benefits of proximal strengthening/bracing Person educated: Patient Education method: Explanation Education comprehension: verbalized understanding and needs further education  HOME EXERCISE PROGRAM: To be provided  GOALS: Goals reviewed with patient? Yes  LONG TERM GOALS: Target date: 05/11/2022 (STG = LTG due to POC length)  Patient will report demonstrate independence with final HEP in order to maintain current gains and continue to progress after physical therapy discharge.   Baseline: To be provided Goal status: INITIAL  2. Patient will improve gait speed to 1.1 m/s or greater to indicate a reduced risk for falls.   Baseline:  Goal status: INITIAL  3.  Patient will improve FGA to greater than 22/30 to indicate a decreased risk of falls and improved dynamic stability.   Baseline: 15/30 Goal status: INITIAL  4.  Patient will improve their 5x Sit to Stand score to less than 13 seconds to demonstrate a  decreased risk for falls and improved LE strength.   Baseline: 15.42" without UE support with some signs of imbalance Goal status: INITIAL  5. SOT goal discontinued due to length of POC and time for assessment Baseline: Not assessed  Goal status: Discontinued  ASSESSMENT:  CLINICAL IMPRESSION: Session emphasized assessment of FGA, further assessment of sensation, and foot up trial. Patient is at an increased risk for falls as indicated by FGA < 23. Patient does have significant impairments in bilateral LE and benefited from intiail bracing trial in today's session to improve gait mechanics/stability. Will continue to assess. Patient will benefit from skilled physical therapy services to improve balance and general mobility to reduce risk for falls in the future.   OBJECTIVE IMPAIRMENTS: Abnormal gait, decreased balance, decreased mobility, difficulty walking, decreased ROM, and decreased strength.   ACTIVITY LIMITATIONS: bending, standing, and transfers  PARTICIPATION LIMITATIONS: community activity and yard work  PERSONAL FACTORS: Age, Time since onset of injury/illness/exacerbation, and 3+ comorbidities: see above/medical chart for details  are also affecting patient's functional outcome.   REHAB POTENTIAL: Good  CLINICAL DECISION MAKING: Evolving/moderate complexity  EVALUATION COMPLEXITY: Moderate  PLAN:  PT FREQUENCY: 1x/week  PT DURATION: 6  weeks   PLANNED INTERVENTIONS: Therapeutic exercises, Therapeutic activity, Neuromuscular re-education, Balance training, Gait training, Patient/Family education, Self Care, Joint mobilization, Orthotic/Fit training, DME instructions, and Re-evaluation  PLAN FOR NEXT SESSION:  continue AFO/foot up brace trials, intial HEP with proximal strengthening / balance tasks, SOT balance tests  Esperanza Heir, PT, DPT 04/05/2022, 12:15 PM

## 2022-04-06 ENCOUNTER — Ambulatory Visit: Payer: PPO | Admitting: Physical Therapy

## 2022-04-12 ENCOUNTER — Ambulatory Visit: Payer: PPO | Admitting: Physical Therapy

## 2022-04-12 ENCOUNTER — Encounter: Payer: Self-pay | Admitting: Physical Therapy

## 2022-04-12 VITALS — BP 140/74 | HR 65

## 2022-04-12 DIAGNOSIS — R2681 Unsteadiness on feet: Secondary | ICD-10-CM

## 2022-04-12 DIAGNOSIS — R269 Unspecified abnormalities of gait and mobility: Secondary | ICD-10-CM

## 2022-04-12 DIAGNOSIS — M21372 Foot drop, left foot: Secondary | ICD-10-CM

## 2022-04-12 NOTE — Therapy (Signed)
OUTPATIENT PHYSICAL THERAPY NEURO TREATMENT   Patient Name: Collin [name] MRN: [id number] DOB:03/28/1940, 82 y.o., male Today's Date: 04/12/2022  PCP: Betty Martinique, MD  REFERRING PROVIDER: Glennon Mac, DO  END OF SESSION:  PT End of Session - 04/12/22 1020     Visit Number 3    Number of Visits 7    Date for PT Re-Evaluation 05/27/22    Authorization Type [employer]    PT Start Time 1017    PT Stop Time 1100    PT Time Calculation (min) 43 min    Equipment Utilized During Treatment Gait belt    Activity Tolerance Patient tolerated treatment well    Behavior During Therapy WFL for tasks assessed/performed              Past Medical History:  Diagnosis Date   Allergy    Arthritis    BPH (benign prostatic hyperplasia)    CAD (coronary artery disease)    Cataract    Diverticulosis of colon    Hx of adenomatous colonic polyps 05/22/2014   Hyperlipidemia    Hypertension    MYOCARDIAL INFARCTION, HX OF 07/20/2006   Qualifier: Diagnosis of  By: Leanne Chang MD, Bruce     Past Surgical History:  Procedure Laterality Date   CATARACT EXTRACTION Bilateral    Bil   COLONOSCOPY     LUMBAR LAMINECTOMY/DECOMPRESSION MICRODISCECTOMY Left 01/20/2018   Procedure: Left Lumbar five Sacral one extraforaminal decompression;  Surgeon: Eustace Moore, MD;  Location: [place];  Service: Neurosurgery;  Laterality: Left;   PTCA     Patient Active Problem List   Diagnosis Date Noted   Right shoulder pain 01/05/2022   Routine general medical examination at a health care facility 01/05/2022   Prediabetes 01/05/2022   S/P lumbar laminectomy 01/20/2018   Essential tremor 11/02/2017   Unstable gait 11/02/2017   Lumbar radiculopathy 07/19/2017   Peripheral neuropathy 12/06/2016   Hx of adenomatous colonic polyps 05/22/2014   Left inguinal hernia 09/10/2011   PSA, INCREASED 08/05/2009   CONGENITAL PIGMENTARY ANOMALY OF SKIN 07/25/2007   Dyslipidemia 07/20/2006   Coronary  atherosclerosis 07/20/2006   BPH associated with nocturia 07/20/2006    ONSET DATE: 02/08/2022 (referral date)  REFERRING DIAG: M54.42,M54.41,G89.29 (ICD-10-CM) - Chronic bilateral low back pain with bilateral sciatica M51.36 (ICD-10-CM) - DDD (degenerative disc disease), lumbar M25.511,G89.29 (ICD-10-CM) - Chronic right shoulder pain M19.011 (ICD-10-CM) - Primary osteoarthritis of right shoulder  THERAPY DIAG:  Abnormality of gait and mobility  Foot drop, left  Unsteadiness on feet  Rationale for Evaluation and Treatment: Rehabilitation  SUBJECTIVE:  SUBJECTIVE STATEMENT: Patient wants to spend today's session trialing braces and is interested in purchasing brace. Prefers to not go through insurance at this time. Has next neurology appointment with Dr. Carles Collet in May. Denies falls/near falls.   Pt accompanied by: self  PERTINENT HISTORY: lumbar stenosis with L foot drop, lumbar laminectomy 2019  PAIN:  Are you having pain? No  PRECAUTIONS: Fall  WEIGHT BEARING RESTRICTIONS: No  FALLS: Has patient fallen in last 6 months?  Reports approximately 10-12 stumbles in the last 6 months  LIVING ENVIRONMENT: Lives with: lives with their spouse Lives in: House/apartment Stairs: Yes: Internal: 16 steps; on right going up and External: 3 steps; bilateral but cannot reach both Has following equipment at home: None  PLOF: Independent  PATIENT GOALS: "To get where I got balance like I used to have."  OBJECTIVE:   DIAGNOSTIC FINDINGS:   IMPRESSION 2/2/224: 1. Worsened moderate spinal canal stenosis and severe bilateral neural foraminal stenosis at L3-L4 due to combination of disc bulge and severe facet arthrosis. 2. Unchanged severe right and moderate left L4-5 neural foraminal stenosis. 3.  Unchanged severe left L5-S1 neural foraminal stenosis.  COGNITION: Overall cognitive status: Impaired - mild word finding exercises   VITALS: Vitals:   04/12/22 1022  BP: (!) 140/74  Pulse: 65      TODAY'S TREATMENT:                                                                                                                               TherAct:  Posterior Leaf Ottobox AFO bilateral trial: Patient demonstrates signficant foot slap on the L with WBOS and intermittent forefoot contact on the R as well. L trendelenburg, no knee thrust noted with ambulation without AFO. Feet have bilateral very high arches and hammer toe posturing bilaterally. Trialed bilateral AFOs which improved heel contact and foot rockers through stance; however, reduced overall balance and demonstrated increased lateral sway. Patient reported feeling less stable and didn't think he would use these types of braces. Preferred the one trialed last session. No wounds notted when doffed.  Ambulated 2 x 115' with SBA.  Foot up brace trial: Trialed foot up bilaterally and improved heel contact and foot rockers through stance, decrease in lateral instability noted; patient reported that he didn't feel as secure as last session with the brace. Doffed R brace entirely and trialed only on L side; tightened L side to increase tension through entire gait cycle. Patient reported feeling increased stability and liked bracing. Ambulated with donned 2 x 115' with SBA. Advised only use of L at this time as gait deviations not markedly improved with use on R.  Educated patient on how to don / doff bracing with teach back method. Will require reinforcement as patient needed mod A for cues to donn brace by himself. Physically has no limitations in donning brace. Provided sticky note of what type of brace and sizing if patient wants to get  at this time.   PATIENT EDUCATION: Education details: sizing of brace, instructions on  donning/doffing Person educated: Patient Education method: Explanation Education comprehension: verbalized understanding and needs further education  HOME EXERCISE PROGRAM: To be provided  GOALS: Goals reviewed with patient? Yes  LONG TERM GOALS: Target date: 05/11/2022 (STG = LTG due to POC length)  Patient will report demonstrate independence with final HEP in order to maintain current gains and continue to progress after physical therapy discharge.   Baseline: To be provided Goal status: INITIAL  2. Patient will improve gait speed to 1.1 m/s or greater to indicate a reduced risk for falls.   Baseline:  Goal status: INITIAL  3.  Patient will improve FGA to greater than 22/30 to indicate a decreased risk of falls and improved dynamic stability.   Baseline: 15/30 Goal status: INITIAL  4.  Patient will improve their 5x Sit to Stand score to less than 13 seconds to demonstrate a decreased risk for falls and improved LE strength.   Baseline: 15.42" without UE support with some signs of imbalance Goal status: INITIAL  5. SOT goal discontinued due to length of POC and time for assessment Baseline: Not assessed  Goal status: Discontinued  ASSESSMENT:  CLINICAL IMPRESSION: Session emphasized continued trial of AFO and foot up brace. While patient does have slight forefoot contact on R and impaired sensation on this side does not appear to require bracing on this side at this time. Increased stability and improved foot positioning noted throughout gait cycle with foot up on the L. Ottobox brace did not appear necessary at this time. Patient will benefit from skilled physical therapy services to improve balance and general mobility to reduce risk for falls in the future.   OBJECTIVE IMPAIRMENTS: Abnormal gait, decreased balance, decreased mobility, difficulty walking, decreased ROM, and decreased strength.   ACTIVITY LIMITATIONS: bending, standing, and transfers  PARTICIPATION  LIMITATIONS: community activity and yard work  PERSONAL FACTORS: Age, Time since onset of injury/illness/exacerbation, and 3+ comorbidities: see above/medical chart for details  are also affecting patient's functional outcome.   REHAB POTENTIAL: Good  CLINICAL DECISION MAKING: Evolving/moderate complexity  EVALUATION COMPLEXITY: Moderate  PLAN:  PT FREQUENCY: 1x/week  PT DURATION: 6 weeks   PLANNED INTERVENTIONS: Therapeutic exercises, Therapeutic activity, Neuromuscular re-education, Balance training, Gait training, Patient/Family education, Self Care, Joint mobilization, Orthotic/Fit training, DME instructions, and Re-evaluation  PLAN FOR NEXT SESSION:  continue AFO/foot up brace trials with education on donning/doffing, intial HEP with proximal strengthening / balance tasks, add sessions as needed  Esperanza Heir, PT, DPT 04/12/2022, 11:38 AM

## 2022-04-13 ENCOUNTER — Ambulatory Visit: Payer: PPO | Admitting: Physical Therapy

## 2022-04-15 ENCOUNTER — Ambulatory Visit: Payer: PPO | Admitting: Physical Therapy

## 2022-04-17 NOTE — Progress Notes (Unsigned)
Care Management & Coordination Services Pharmacy Note  04/20/2022 Name:  Collin [name] MRN:  [id number] DOB:  November 04, 1940  Summary: BP not at goal <140/90 at last check but generally well-controlled LDL at goal <70, due for updated panel (last 2022)  Recommendations/Changes made from today's visit: -Check BP once weekly and keep a log -Continue cholesterol medication and ORDER updated lipid panel at next OV  Follow up plan: Pharmacist visit in Nov Scheduled physical with PCP per patient request   Subjective: Collin [name] is an 82 y.o. year old male who is a primary patient of Martinique, Malka So, MD.  The care coordination team was consulted for assistance with disease management and care coordination needs.    Engaged with patient by telephone for follow up visit.  Recent office visits: 03/04/22 Lucretia Kern, DO - For AWV  01/05/22 Martinique, Betty G, MD - Presented for routine general medical examination at a health care facility and other concerns. No medication changes   Recent consult visits: 03/01/22 Glennon Mac, DO (Sports Med) - Patient presented for Chronic bilateral low back pain with bilateral sciatica and other concerns. No medication changes.   02/08/22 Theadore Nan, DO (Sports Med) - Patient presented for Chronic bilateral low back pain with bilateral sciatica and other concerns. No medication changes.   01/27/22 Ralene Ok, MD - Patient presented for Unilateral inguinal hernia without obstruction or gangrene recurrence not specified. No medication changes   01/07/22 Glennon Mac, DO (Sports Med) - Patient presented for Chronic bilateral low back pain with bilateral sciatica and other concerns. Prescribed Meloxicam.  Hospital visits: None in previous 6 months   Objective:  Lab Results  Component Value Date   CREATININE 1.32 07/15/2021   BUN 17 07/15/2021   GFR 50.05 (L) 07/15/2021   GFRNONAA 59 (L) 01/16/2020   GFRAA 68 01/16/2020   NA 136  07/15/2021   K 4.3 07/15/2021   CALCIUM 9.2 07/15/2021   CO2 27 07/15/2021   GLUCOSE 145 (H) 07/15/2021    Lab Results  Component Value Date/Time   HGBA1C 5.7 01/05/2022 04:04 PM   HGBA1C 6.3 07/15/2021 02:16 PM   HGBA1C 6.3 12/03/2020 08:37 AM   GFR 50.05 (L) 07/15/2021 02:16 PM   GFR 58.69 (L) 12/03/2020 08:37 AM    Last diabetic Eye exam: No results found for: "HMDIABEYEEXA"  Last diabetic Foot exam: No results found for: "HMDIABFOOTEX"   Lab Results  Component Value Date   CHOL 122 12/03/2020   HDL 61.10 12/03/2020   LDLCALC 52 12/03/2020   TRIG 47.0 12/03/2020   CHOLHDL 2 12/03/2020       Latest Ref Rng & Units 12/03/2020    8:37 AM 11/05/2019    8:28 AM 06/04/2019   10:09 AM  Hepatic Function  Total Protein 6.0 - 8.3 g/dL 6.8  7.0  7.2   Albumin 3.5 - 5.2 g/dL 4.1   4.4   AST 0 - 37 U/L 31  32  38   ALT 0 - 53 U/L 24  27  30    Alk Phosphatase 39 - 117 U/L 39   50   Total Bilirubin 0.2 - 1.2 mg/dL 0.9  0.9  0.7   Bilirubin, Direct 0.0 - 0.2 mg/dL  0.2      Lab Results  Component Value Date/Time   TSH 2.00 07/15/2021 02:16 PM   TSH 1.98 06/04/2019 10:09 AM       Latest Ref Rng & Units 01/01/2020    3:31  PM 06/04/2019   10:09 AM 01/09/2018   11:38 AM  CBC  WBC 3.4 - 10.8 x10E3/uL 8.6  6.6  7.9   Hemoglobin 13.0 - 17.7 g/dL 15.1  15.3  15.4   Hematocrit 37.5 - 51.0 % 44.7  45.9  48.1   Platelets 150 - 450 x10E3/uL 200  201.0  197     Lab Results  Component Value Date/Time   VD25OH 92.88 06/04/2019 10:09 AM   VD25OH 60 07/24/2008 09:28 PM   VITAMINB12 983 (H) 07/15/2021 02:16 PM   VITAMINB12 601 01/26/2017 11:46 AM    Clinical ASCVD: Yes  The ASCVD Risk score (Arnett DK, et al., 2019) failed to calculate for the following reasons:   The 2019 ASCVD risk score is only valid for ages 74 to 3   The patient has a prior MI or stroke diagnosis        03/04/2022    3:54 PM 01/05/2022    3:02 PM 07/15/2021   11:03 AM  Depression screen PHQ 2/9   Decreased Interest 0 0 0  Down, Depressed, Hopeless 0 0 0  PHQ - 2 Score 0 0 0  Altered sleeping 0    Tired, decreased energy 0    Change in appetite 0    Feeling bad or failure about yourself  0    Trouble concentrating 1    Moving slowly or fidgety/restless 0    Suicidal thoughts 0    PHQ-9 Score 1    Difficult doing work/chores Not difficult at all       Social History   Tobacco Use  Smoking Status Never  Smokeless Tobacco Never   BP Readings from Last 3 Encounters:  04/12/22 (!) 140/74  04/05/22 126/64  03/30/22 (!) 144/91   Pulse Readings from Last 3 Encounters:  04/12/22 65  04/05/22 61  03/30/22 (!) 105   Wt Readings from Last 3 Encounters:  03/01/22 200 lb (90.7 kg)  02/08/22 201 lb (91.2 kg)  01/07/22 195 lb (88.5 kg)   BMI Readings from Last 3 Encounters:  03/01/22 26.39 kg/m  02/08/22 26.52 kg/m  01/07/22 25.73 kg/m    Allergies  Allergen Reactions   Penicillins Rash and Other (See Comments)    PATIENT HAS HAD A PCN REACTION WITH IMMEDIATE RASH, FACIAL/TONGUE/THROAT SWELLING, SOB, OR LIGHTHEADEDNESS WITH HYPOTENSION:  #  #  YES  #  #  Has patient had a PCN reaction causing severe rash involving mucus membranes or skin necrosis: No Has patient had a PCN reaction that required hospitalization: No Has patient had a PCN reaction occurring within the last 10 years: No If all of the above answers are "NO", then may proceed with Cephalosporin use.    Medications Reviewed Today     Reviewed by Maren Reamer, [place] (Pharmacist) on 04/20/22 at [place] List Status: <None>   Medication Order Taking? Sig Documenting Provider Last Dose Status Informant  aspirin 81 MG tablet [id number] No Take 1 tablet (81 mg total) by mouth at bedtime. Costella, Vista Mink, PA-C Taking Active   Cholecalciferol (VITAMIN D3) 125 MCG (5000 UT) CAPS [id number] No Take 5,000 Units by mouth at bedtime. [provider] Taking Active Self  Coenzyme Q10 300 MG CAPS  [id number] No Take 300 mg by mouth daily. [provider] Taking Active Self  Cyanocobalamin (VITAMIN B-12) 2500 MCG SUBL [id number] No Place 1 tablet under the tongue. Taking 5 times a week [provider] Taking Active   fish  oil-omega-3 fatty acids 1000 MG capsule [id number] No Take 1 g by mouth daily. [provider] Taking Active Self  gabapentin (NEURONTIN) 100 MG capsule [id number] No TAKE 1 CAPSULE(100 MG) BY MOUTH TWICE DAILY  Patient not taking: Reported on 03/30/2022   Martinique, Betty G, MD Not Taking Active Self           Med Note ([place], [place] Sep 01, 2021  1:14 PM) Once a day  Glucosamine-Chondroitin (MOVE FREE PO) [id number] No Take 1 tablet by mouth daily. [provider] Taking Active Self  meloxicam (MOBIC) 15 MG tablet [id number] No Take 1 tablet (15 mg total) by mouth daily. Glennon Mac, DO Taking Active   metoprolol succinate (TOPROL-XL) 25 MG 24 hr tablet [id number] No TAKE 1 TABLET(25 MG) BY MOUTH DAILY Sherren Mocha, MD Taking Active   Multiple Vitamin (MULTIVITAMIN) tablet [id number] No Take 1 tablet by mouth daily. [provider] Taking Active Self  nitroGLYCERIN (NITROSTAT) 0.4 MG SL tablet [id number] No Place 1 tablet (0.4 mg total) under the tongue every 5 (five) minutes as needed for chest pain. Sherren Mocha, MD Taking Active            Med Note Juventino Slovak   Tue Jan 01, 2020  2:18 PM)    Probiotic Product (PROBIOTIC ADVANCED PO) [id number] No Take 1 capsule by mouth daily. [provider] Taking Active   simvastatin (ZOCOR) 40 MG tablet [id number]  TAKE 1 TABLET BY MOUTH DAILY AT 6 PM Sherren Mocha, MD  Active   tamsulosin (FLOMAX) 0.4 MG CAPS capsule [id number]  TAKE 1 CAPSULE(0.4 MG) BY MOUTH DAILY Martinique, Betty G, MD  Active             SDOH:  (Social Determinants of Health) assessments and interventions performed: Yes SDOH Interventions    [place] Coordination from 04/20/2022 in  [place] Management from 10/16/2021 in [place] at [place] Management from 01/15/2020 in [place] at [place] from 01/03/2020 in [place] at [place] Interventions Intervention Not Indicated -- -- Intervention Not Indicated  Housing Interventions Intervention Not Indicated -- -- Intervention Not Indicated  Transportation Interventions -- -- Intervention Not Indicated Intervention Not Indicated  Depression Interventions/Treatment  -- -- -- PHQ2-9 Score <4 Follow-up Not Indicated  Financial Strain Interventions -- Intervention Not Indicated Intervention Not Indicated Intervention Not Indicated  Physical Activity Interventions -- -- -- Intervention Not Indicated  Stress Interventions -- -- -- Intervention Not Indicated  Social Connections Interventions -- -- -- Intervention Not Indicated       Medication Assistance: None required.  Patient affirms current coverage meets needs.  Medication Access: Within the past 30 days, how often has patient missed a dose of medication? None Is a pillbox or other method used to improve adherence? Yes  Factors that may affect medication adherence? no barriers identified Are meds synced by current pharmacy? No  Are meds delivered by current pharmacy? No  Does patient experience delays in picking up medications due to transportation concerns? No   [facility] Reviewed: Is patient disadvantaged to use [facility]?: Yes  Current Rx insurance plan: H[employer] Name and location of Current pharmacy:  [facility] [place], [address] AT [address] [place] [address] Phone: [phone] Fax: [phone]  [facility] services reviewed with patient today?: No  Patient requests to transfer care to [facility]  Pharmacy?: No  Reason patient declined to change pharmacies: Disadvantaged due to insurance/mail order  Compliance/Adherence/Medication fill history: Care Gaps: Covid Booster  Star-Rating Drugs: Simvastatin 40mg  PDC 100%   Assessment/Plan   Hypertension (BP goal <140/90) -Uncontrolled -Current treatment: Metoprolol XL 25mg  1 qd -Medications previously tried: None  -Current home readings: getting checked regularly at PT -Current dietary habits: mindful of salt in diet -Current exercise habits: doing PT weekly -Denies hypotensive/hypertensive symptoms -Educated on BP goals and benefits of medications for prevention of heart attack, stroke and kidney damage; Daily salt intake goal < 2300 mg; Exercise goal of 150 minutes per week; Importance of home blood pressure monitoring; -Counseled to monitor BP at home weekly, document, and provide log at future appointments -Recommended to continue current medication  Hyperlipidemia: (LDL goal < 70) -Not ideally controlled -Current treatment: Simvastatin 40mg  1 qd -Medications previously tried: None  -Current dietary patterns: not discussed -Current exercise habits: see above -Educated on Cholesterol goals;  Benefits of statin for ASCVD risk reduction; Importance of limiting foods high in cholesterol; -Recommended to continue current medication Recommended updated lipid panel at next [place] Pharmacist [phone]

## 2022-04-19 ENCOUNTER — Telehealth: Payer: Self-pay

## 2022-04-19 NOTE — Progress Notes (Signed)
Patient ID: Collin [name], male   DOB: [birth year], 82 y.o.   MRN: [id number] Care Management & Coordination Services Pharmacy Team  Reason for Encounter: Appointment Reminder  Contacted patient to confirm telephone appointment with Theo Dills, PharmD on 04/20/22 at 4. Spoke with patient on 04/19/2022      Star Rating Drugs:  Simvastatin 40mg  - Last filled 03/05/22 90 DS at [place] Pharmacist Assistant [phone]

## 2022-04-20 ENCOUNTER — Ambulatory Visit: Payer: PPO

## 2022-04-20 ENCOUNTER — Ambulatory Visit: Payer: PPO | Admitting: Physical Therapy

## 2022-04-22 ENCOUNTER — Ambulatory Visit: Payer: PPO | Admitting: Physical Therapy

## 2022-04-27 ENCOUNTER — Ambulatory Visit: Payer: PPO | Admitting: Physical Therapy

## 2022-04-27 ENCOUNTER — Encounter: Payer: Self-pay | Admitting: Physical Therapy

## 2022-04-27 ENCOUNTER — Ambulatory Visit: Payer: PPO | Attending: Sports Medicine | Admitting: Physical Therapy

## 2022-04-27 VITALS — BP 125/61 | HR 65

## 2022-04-27 DIAGNOSIS — R269 Unspecified abnormalities of gait and mobility: Secondary | ICD-10-CM

## 2022-04-27 DIAGNOSIS — M21372 Foot drop, left foot: Secondary | ICD-10-CM | POA: Insufficient documentation

## 2022-04-27 DIAGNOSIS — R2681 Unsteadiness on feet: Secondary | ICD-10-CM | POA: Diagnosis not present

## 2022-04-27 NOTE — Therapy (Signed)
OUTPATIENT PHYSICAL THERAPY NEURO TREATMENT   Patient Name: Collin [name] MRN: [id number] DOB:1941/01/24, 82 y.o., male Today's Date: 04/27/2022  PCP: Betty Martinique, MD  REFERRING PROVIDER: Glennon Mac, DO  END OF SESSION:  PT End of Session - 04/27/22 0937     Visit Number 4    Number of Visits 7    Date for PT Re-Evaluation 05/27/22    Authorization Type [employer]    PT Start Time 0927    PT Stop Time 1015    PT Time Calculation (min) 48 min    Equipment Utilized During Treatment Gait belt    Activity Tolerance Patient tolerated treatment well    Behavior During Therapy WFL for tasks assessed/performed              Past Medical History:  Diagnosis Date   Allergy    Arthritis    BPH (benign prostatic hyperplasia)    CAD (coronary artery disease)    Cataract    Diverticulosis of colon    Hx of adenomatous colonic polyps 05/22/2014   Hyperlipidemia    Hypertension    MYOCARDIAL INFARCTION, HX OF 07/20/2006   Qualifier: Diagnosis of  By: Leanne Chang MD, Bruce     Past Surgical History:  Procedure Laterality Date   CATARACT EXTRACTION Bilateral    Bil   COLONOSCOPY     LUMBAR LAMINECTOMY/DECOMPRESSION MICRODISCECTOMY Left 01/20/2018   Procedure: Left Lumbar five Sacral one extraforaminal decompression;  Surgeon: Eustace Moore, MD;  Location: [facility];  Service: Neurosurgery;  Laterality: Left;   PTCA     Patient Active Problem List   Diagnosis Date Noted   Right shoulder pain 01/05/2022   Routine general medical examination at a health care facility 01/05/2022   Prediabetes 01/05/2022   S/P lumbar laminectomy 01/20/2018   Essential tremor 11/02/2017   Unstable gait 11/02/2017   Lumbar radiculopathy 07/19/2017   Peripheral neuropathy 12/06/2016   Hx of adenomatous colonic polyps 05/22/2014   Left inguinal hernia 09/10/2011   PSA, INCREASED 08/05/2009   CONGENITAL PIGMENTARY ANOMALY OF SKIN 07/25/2007   Dyslipidemia 07/20/2006   Coronary  atherosclerosis 07/20/2006   BPH associated with nocturia 07/20/2006    ONSET DATE: 02/08/2022 (referral date)  REFERRING DIAG: M54.42,M54.41,G89.29 (ICD-10-CM) - Chronic bilateral low back pain with bilateral sciatica M51.36 (ICD-10-CM) - DDD (degenerative disc disease), lumbar M25.511,G89.29 (ICD-10-CM) - Chronic right shoulder pain M19.011 (ICD-10-CM) - Primary osteoarthritis of right shoulder  THERAPY DIAG:  Abnormality of gait and mobility  Foot drop, left  Unsteadiness on feet  Rationale for Evaluation and Treatment: Rehabilitation  SUBJECTIVE:  SUBJECTIVE STATEMENT: Patient arrives to session with new self purchased foot drop brace on L. States he is not sure if it helped. Reports that he does not want to try trekking poles or a cane. Reports that he wants a few more exercises to work on his balance.   Pt accompanied by: self  PERTINENT HISTORY: lumbar stenosis with L foot drop, lumbar laminectomy 2019  PAIN:  Are you having pain? No  PRECAUTIONS: Fall  WEIGHT BEARING RESTRICTIONS: No  FALLS: Has patient fallen in last 6 months?  Reports approximately 10-12 stumbles in the last 6 months  LIVING ENVIRONMENT: Lives with: lives with their spouse Lives in: House/apartment Stairs: Yes: Internal: 16 steps; on right going up and External: 3 steps; bilateral but cannot reach both Has following equipment at home: None  PLOF: Independent  PATIENT GOALS: "To get where I got balance like I used to have."  OBJECTIVE:   DIAGNOSTIC FINDINGS:   IMPRESSION 2/2/224: 1. Worsened moderate spinal canal stenosis and severe bilateral neural foraminal stenosis at L3-L4 due to combination of disc bulge and severe facet arthrosis. 2. Unchanged severe right and moderate left L4-5 neural  foraminal stenosis. 3. Unchanged severe left L5-S1 neural foraminal stenosis.  COGNITION: Overall cognitive status: Impaired - mild word finding exercises   VITALS: Vitals:   04/27/22 0939  BP: 125/61  Pulse: 65       TODAY'S TREATMENT:                                                                                                                               TherAct:  Foot up brace adjustment/education: Patient arrives to session with new foot up brace with clip slightly different from one trialed in clinic. Provided recommendations on how to adjust brace for maximal impact as patient had not tightened sufficiently to fully prevent foot drop when ambulating into clinic. After tightening patient ambulated 1 x 300 feet with SBA. Patient reports being unsure of where to sit down after completing trial but reports that the adjustment feel better. Continues to politely refuse trial of sturdier bracing or AD at this time to improve additional gait deviations including forward flexed posture, trendelenburg, decreased hip extension/stride length. Educated patient on how to don / doff bracing to maximally achieve optimal foot position. Patient reports confidence in being able to adjust at home.    TherEx: Exercises reviewed in session for HEP (SBA):  - Standing Hip Abduction with Counter Support  - 1 set - 10 reps - Standing Hip Flexion with Counter Support  - 1 set - 10 reps - Standing Hip Extension with Counter Support  - 1 set - 10 reps - Bird Dog on Counter  - 1 set - 10 reps - Tandem Walking with Counter Support  - 3 sets x 10 feet - Forward and Backward Monster Walk with Resistance at Ankles and Counter Support - 3 sets x 10 feet - Side Stepping with  Resistance at Ankles and Counter Support  - 3 sets x 10 feet   PATIENT EDUCATION: Education details: sizing of brace, instructions on donning/doffing Person educated: Patient Education method: Explanation Education comprehension:  verbalized understanding and needs further education  HOME EXERCISE PROGRAM: Access Code: [email or web address] URL: [email or web address] Date: 04/27/2022 Prepared by: Malachi Carl  Exercises - Standing Hip Abduction with Counter Support  - 1 x daily - 7 x weekly - 3 sets - 10 reps - Standing Hip Flexion with Counter Support  - 1 x daily - 7 x weekly - 3 sets - 10 reps - Standing Hip Extension with Counter Support  - 1 x daily - 7 x weekly - 3 sets - 10 reps - Bird Dog on Counter  - 1 x daily - 7 x weekly - 3 sets - 10 reps - Tandem Walking with Counter Support  - 1 x daily - 7 x weekly - 3 sets - Forward and Backward Monster Walk with Resistance at Ankles and Counter Support  - 1 x daily - 7 x weekly - 3 sets - Side Stepping with Resistance at Ankles and Counter Support  - 1 x daily - 7 x weekly - 3 sets  GOALS: Goals reviewed with patient? Yes  LONG TERM GOALS: Target date: 05/11/2022 (STG = LTG due to POC length)  Patient will report demonstrate independence with final HEP in order to maintain current gains and continue to progress after physical therapy discharge.   Baseline: To be provided Goal status: INITIAL  2. Patient will improve gait speed to 1.1 m/s or greater to indicate a reduced risk for falls.   Baseline:  Goal status: INITIAL  3.  Patient will improve FGA to greater than 22/30 to indicate a decreased risk of falls and improved dynamic stability.   Baseline: 15/30 Goal status: INITIAL  4.  Patient will improve their 5x Sit to Stand score to less than 13 seconds to demonstrate a decreased risk for falls and improved LE strength.   Baseline: 15.42" without UE support with some signs of imbalance Goal status: INITIAL  5. SOT goal discontinued due to length of POC and time for assessment Baseline: Not assessed  Goal status: Discontinued  ASSESSMENT:  CLINICAL IMPRESSION: Session emphasized education and adjustment of patient-purchased foot up brace for  optimal gains. Patient would benefit from additional AD like trekking poles, cane, or rollator; however, patient continues to refuse trial at this time and verbalizes satisfaction with current level of bracing. End of session worked to provide updated HEP for patient to continue to progress proximal strength and appropriate stepping strategy at home. Patient will benefit from skilled physical therapy services to improve balance and general mobility to reduce risk for falls in the future.   OBJECTIVE IMPAIRMENTS: Abnormal gait, decreased balance, decreased mobility, difficulty walking, decreased ROM, and decreased strength.   ACTIVITY LIMITATIONS: bending, standing, and transfers  PARTICIPATION LIMITATIONS: community activity and yard work  PERSONAL FACTORS: Age, Time since onset of injury/illness/exacerbation, and 3+ comorbidities: see above/medical chart for details  are also affecting patient's functional outcome.   REHAB POTENTIAL: Good  CLINICAL DECISION MAKING: Evolving/moderate complexity  EVALUATION COMPLEXITY: Moderate  PLAN:  PT FREQUENCY: 1x/week  PT DURATION: 6 weeks   PLANNED INTERVENTIONS: Therapeutic exercises, Therapeutic activity, Neuromuscular re-education, Balance training, Gait training, Patient/Family education, Self Care, Joint mobilization, Orthotic/Fit training, DME instructions, and Re-evaluation  PLAN FOR NEXT SESSION:  review HEP, how is foot brace working, final questions, plan for D/C pending  patient subjective report  Esperanza Heir, PT, DPT 04/27/2022, 11:13 AM

## 2022-04-29 ENCOUNTER — Ambulatory Visit: Payer: PPO | Admitting: Physical Therapy

## 2022-05-04 ENCOUNTER — Ambulatory Visit: Payer: PPO | Admitting: Physical Therapy

## 2022-05-06 ENCOUNTER — Ambulatory Visit: Payer: PPO | Admitting: Physical Therapy

## 2022-05-11 ENCOUNTER — Ambulatory Visit: Payer: PPO | Admitting: Physical Therapy

## 2022-05-13 ENCOUNTER — Ambulatory Visit: Payer: PPO | Admitting: Physical Therapy

## 2022-05-18 ENCOUNTER — Ambulatory Visit: Payer: PPO | Admitting: Physical Therapy

## 2022-05-24 ENCOUNTER — Encounter: Payer: Self-pay | Admitting: Cardiovascular Disease

## 2022-05-24 ENCOUNTER — Ambulatory Visit: Payer: PPO | Attending: Cardiovascular Disease | Admitting: Cardiovascular Disease

## 2022-05-24 VITALS — BP 128/70 | HR 77 | Ht 73.0 in | Wt 197.0 lb

## 2022-05-24 DIAGNOSIS — E782 Mixed hyperlipidemia: Secondary | ICD-10-CM

## 2022-05-24 DIAGNOSIS — I10 Essential (primary) hypertension: Secondary | ICD-10-CM | POA: Diagnosis not present

## 2022-05-24 DIAGNOSIS — I2510 Atherosclerotic heart disease of native coronary artery without angina pectoris: Secondary | ICD-10-CM

## 2022-05-24 NOTE — Patient Instructions (Signed)
Medication Instructions:  Your physician recommends that you continue on your current medications as directed. Please refer to the Current Medication list given to you today.  *If you need a refill on your cardiac medications before your next appointment, please call your pharmacy*   Lab Work: Lipids, Liver (in the next week) If you have labs (blood work) drawn today and your tests are completely normal, you will receive your results only by: MyChart Message (if you have MyChart) OR A paper copy in the mail If you have any lab test that is abnormal or we need to change your treatment, we will call you to review the results.   Testing/Procedures: NONE   Follow-Up: At [facility], you and your health needs are our priority.  As part of our continuing mission to provide you with exceptional heart care, we have created designated Provider Care Teams.  These Care Teams include your primary Cardiologist (physician) and Advanced Practice Providers (APPs -  Physician Assistants and Nurse Practitioners) who all work together to provide you with the care you need, when you need it.  We recommend signing up for the patient portal called "MyChart".  Sign up information is provided on this After Visit Summary.  MyChart is used to connect with patients for Virtual Visits (Telemedicine).  Patients are able to view lab/test results, encounter notes, upcoming appointments, etc.  Non-urgent messages can be sent to your provider as well.   To learn more about what you can do with MyChart, go to [email or web address].    Your next appointment:   1 year(s)  Provider:   Tonny Bollman, MD

## 2022-05-24 NOTE — Progress Notes (Signed)
Cardiology Office Note:    Date:  05/24/2022   ID:  [name], DOB April 27, 1940, MRN [id number]  PCP:  [place], Betty G, MD   [facility] Providers Cardiologist:  Tonny Bollman, MD     Referring MD: [place], Betty G, MD   Chief Complaint  Patient presents with   Coronary Artery Disease    History of Present Illness:    Collin [name] is a 82 y.o. male with a hx of: Coronary artery disease  S/p remote inferior MI in 1998 tx with POBA to RCA  Mid LAD disease tx medically (beta-blocker) Cath 9/13: mLAD 80; oD1 80, pD2 80; mLCx 50, OM1 50-60; mRCA 30 Lumbar disc dz S/p L5-S1 microdiscectomy 12/19 Hypertension  Hyperlipidemia  BPH  The patient is here alone today.  He continues to do well from a cardiovascular perspective.  He is going to the gym 3 days/week and then he walks the track at [facility] for about 3 miles every Saturday.  States that he walks 21-minute mile pace and mainly slows down because of his legs.  He does not have any chest pain, chest pressure, or shortness of breath.  He denies palpitations, lightheadedness, or syncope.  Past Medical History:  Diagnosis Date   Allergy    Arthritis    BPH (benign prostatic hyperplasia)    CAD (coronary artery disease)    Cataract    Diverticulosis of colon    Hx of adenomatous colonic polyps 05/22/2014   Hyperlipidemia    Hypertension    MYOCARDIAL INFARCTION, HX OF 07/20/2006   Qualifier: Diagnosis of  By: Cato Mulligan MD, Bruce      Past Surgical History:  Procedure Laterality Date   CATARACT EXTRACTION Bilateral    Bil   COLONOSCOPY     LUMBAR LAMINECTOMY/DECOMPRESSION MICRODISCECTOMY Left 01/20/2018   Procedure: Left Lumbar five Sacral one extraforaminal decompression;  Surgeon: Tia Alert, MD;  Location: [facility] OR;  Service: Neurosurgery;  Laterality: Left;   PTCA      Current Medications: Current Meds  Medication Sig   aspirin 81 MG tablet Take 1 tablet (81 mg total) by mouth at bedtime.    Cholecalciferol (VITAMIN D3) 125 MCG (5000 UT) CAPS Take 5,000 Units by mouth at bedtime.   Coenzyme Q10 300 MG CAPS Take 300 mg by mouth daily.   Cyanocobalamin (VITAMIN B-12) 2500 MCG SUBL Place 1 tablet under the tongue. Taking 5 times a week   fish oil-omega-3 fatty acids 1000 MG capsule Take 1 g by mouth daily.   Glucosamine-Chondroitin (MOVE FREE PO) Take 1 tablet by mouth daily.   metoprolol succinate (TOPROL-XL) 25 MG 24 hr tablet TAKE 1 TABLET(25 MG) BY MOUTH DAILY   Multiple Vitamin (MULTIVITAMIN) tablet Take 1 tablet by mouth daily.   nitroGLYCERIN (NITROSTAT) 0.4 MG SL tablet Place 1 tablet (0.4 mg total) under the tongue every 5 (five) minutes as needed for chest pain.   Probiotic Product (PROBIOTIC ADVANCED PO) Take 1 capsule by mouth daily.   simvastatin (ZOCOR) 40 MG tablet TAKE 1 TABLET BY MOUTH DAILY AT 6 PM   tamsulosin (FLOMAX) 0.4 MG CAPS capsule TAKE 1 CAPSULE(0.4 MG) BY MOUTH DAILY   [DISCONTINUED] gabapentin (NEURONTIN) 100 MG capsule TAKE 1 CAPSULE(100 MG) BY MOUTH TWICE DAILY   [DISCONTINUED] meloxicam (MOBIC) 15 MG tablet Take 1 tablet (15 mg total) by mouth daily.     Allergies:   Penicillins   Social History   Socioeconomic History   Marital status: Married  Spouse name: Not on file   Number of children: 3   Years of education: 74   Highest education level: Not on file  Occupational History   Occupation: retired  Tobacco Use   Smoking status: Never   Smokeless tobacco: Never  Vaping Use   Vaping Use: Never used  Substance and Sexual Activity   Alcohol use: Yes    Comment: wine once a week and beer once a week   Drug use: No   Sexual activity: Not on file  Other Topics Concern   Not on file  Social History Narrative   Lives with wife in a 2 story home.  Has 3 children.     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    Education: college.    Occasional beer, no smoking, no drugs   Social Determinants of Health   Financial  Resource Strain: Low Risk  (10/16/2021)   Overall Financial Resource Strain (CARDIA)    Difficulty of Paying Living Expenses: Not hard at all  Food Insecurity: No Food Insecurity (04/20/2022)   Hunger Vital Sign    Worried About Running Out of Food in the Last Year: Never true    Ran Out of Food in the Last Year: Never true  Transportation Needs: No Transportation Needs (03/03/2021)   PRAPARE - Administrator, Civil Service (Medical): No    Lack of Transportation (Non-Medical): No  Physical Activity: Sufficiently Active (03/03/2021)   Exercise Vital Sign    Days of Exercise per Week: 6 days    Minutes of Exercise per Session: 90 min  Stress: No Stress Concern Present (03/03/2021)   [employer] of Occupational Health - Occupational Stress Questionnaire    Feeling of Stress : Not at all  Social Connections: Moderately Integrated (01/03/2020)   Social Connection and Isolation Panel [NHANES]    Frequency of Communication with Friends and Family: More than three times a week    Frequency of Social Gatherings with Friends and Family: Three times a week    Attends Religious Services: More than 4 times per year    Active Member of Clubs or Organizations: No    Attends Banker Meetings: Never    Marital Status: Married     Family History: The patient's family history includes AAA (abdominal aortic aneurysm) in his father and paternal uncle; Breast cancer in his daughter; Dementia (age of onset: 4) in his mother; Diabetes in his brother; Heart disease in his father and mother; Hypertension in his sister; Kidney disease in his father; Stroke in his father. There is no history of Colon cancer.  ROS:   Please see the history of present illness.    All other systems reviewed and are negative.  EKGs/Labs/Other Studies Reviewed:    The following studies were reviewed today:      EKG:  EKG is ordered today.  The ekg ordered today demonstrates NSR 77 bpm, first degree AV  block. Otherwise within normal limits.   Recent Labs: 07/15/2021: BUN 17; Creatinine, Ser 1.32; Potassium 4.3; Sodium 136; TSH 2.00  Recent Lipid Panel    Component Value Date/Time   CHOL 122 12/03/2020 0837   TRIG 47.0 12/03/2020 0837   HDL 61.10 12/03/2020 0837   CHOLHDL 2 12/03/2020 0837   VLDL 9.4 12/03/2020 0837   LDLCALC 52 12/03/2020 0837   LDLCALC 54 11/05/2019 0828     Risk Assessment/Calculations:  Physical Exam:    VS:  BP 128/70   Pulse 77   Ht 6\' 1"  (1.854 m)   Wt 197 lb (89.4 kg)   SpO2 94%   BMI 25.99 kg/m     Wt Readings from Last 3 Encounters:  05/24/22 197 lb (89.4 kg)  03/01/22 200 lb (90.7 kg)  02/08/22 201 lb (91.2 kg)     GEN:  Well nourished, well developed in no acute distress HEENT: Normal NECK: No JVD; No carotid bruits LYMPHATICS: No lymphadenopathy CARDIAC: RRR, no murmurs, rubs, gallops RESPIRATORY:  Clear to auscultation without rales, wheezing or rhonchi  ABDOMEN: Soft, non-tender, non-distended MUSCULOSKELETAL:  No edema; No deformity  SKIN: Warm and dry NEUROLOGIC:  Alert and oriented x 3 PSYCHIATRIC:  Normal affect   ASSESSMENT:    1. Coronary artery disease involving native coronary artery of native heart without angina pectoris   2. Mixed hyperlipidemia   3. Essential hypertension    PLAN:    In order of problems listed above:  The patient remained stable without symptoms of angina.  He will continue on aspirin for antiplatelet therapy, metoprolol succinate, and simvastatin.  He appears to be doing very well and I will see him back in 1 year for follow-up evaluation. LDL cholesterol has been consistently less than 70 mg/dL.  The patient is treated with simvastatin.  He is due for lipids and I will update his lipids and LFTs in the near future.  Continue current management. Blood pressure is well-controlled on metoprolol succinate.  Continue the same with no change.     Medication Adjustments/Labs and  Tests Ordered: Current medicines are reviewed at length with the patient today.  Concerns regarding medicines are outlined above.  Orders Placed This Encounter  Procedures   Lipid panel   Hepatic function panel   EKG 12-Lead   No orders of the defined types were placed in this encounter.   Patient Instructions  Medication Instructions:  Your physician recommends that you continue on your current medications as directed. Please refer to the Current Medication list given to you today.  *If you need a refill on your cardiac medications before your next appointment, please call your pharmacy*   Lab Work: Lipids, Liver (in the next week) If you have labs (blood work) drawn today and your tests are completely normal, you will receive your results only by: MyChart Message (if you have MyChart) OR A paper copy in the mail If you have any lab test that is abnormal or we need to change your treatment, we will call you to review the results.   Testing/Procedures: NONE   Follow-Up: At [facility], you and your health needs are our priority.  As part of our continuing mission to provide you with exceptional heart care, we have created designated Provider Care Teams.  These Care Teams include your primary Cardiologist (physician) and Advanced Practice Providers (APPs -  Physician Assistants and Nurse Practitioners) who all work together to provide you with the care you need, when you need it.  We recommend signing up for the patient portal called "MyChart".  Sign up information is provided on this After Visit Summary.  MyChart is used to connect with patients for Virtual Visits (Telemedicine).  Patients are able to view lab/test results, encounter notes, upcoming appointments, etc.  Non-urgent messages can be sent to your provider as well.   To learn more about what you can do with MyChart, go to [email or web address].    Your  next appointment:   1 year(s)  Provider:   Tonny Bollman, MD        Signed, Tonny Bollman, MD  05/24/2022 5:11 PM    [facility]

## 2022-05-25 ENCOUNTER — Telehealth: Payer: Self-pay

## 2022-05-25 NOTE — Progress Notes (Signed)
Care Management & Coordination Services Pharmacy Team  Reason for Encounter: Return VM   What concerns do you have about your medications? None   The patient denies side effects with their medications.   How often do you forget or accidentally miss a dose? Never  Do you use a pillbox? No  Are you having any problems getting your medications from your pharmacy? No  Has the cost of your medications been a concern? No  The patient has not had an ED visit since last contact.   The patient denies problems with their health.   Patient reports the following concerns or questions for PCP: Patient reports he is upset that labs were not ordered for him at his last physical in office per usual routine and because of it he had to have it done at Cardiology on yesterday. He reports he'd like a call from his provider explaining why this has happened. Advised him I would pass the information along for him, he reports no concerns about the labs that were drawn or any questions  just wants to get to speak with PCP. Forwarded to Pharmacist for assistance.   Counseled patient on: Access to carecoordination team for any cost, medication or pharmacy concerns.   Chart Updates:  Recent office visits:  None   Recent consult visits:  05/24/22 Tonny Bollman, MD (Cardiology) - Patient presented for Coronary artery disease involving native coronary artery of native heart without angina pectoris and other concerns. Stopped Gabapentin. Stopped Meloxicam .  Hospital visits:  None in previous 6 months  Medications: Outpatient Encounter Medications as of 05/25/2022  Medication Sig   aspirin 81 MG tablet Take 1 tablet (81 mg total) by mouth at bedtime.   Cholecalciferol (VITAMIN D3) 125 MCG (5000 UT) CAPS Take 5,000 Units by mouth at bedtime.   Coenzyme Q10 300 MG CAPS Take 300 mg by mouth daily.   Cyanocobalamin (VITAMIN B-12) 2500 MCG SUBL Place 1 tablet under the tongue. Taking 5 times a week   fish  oil-omega-3 fatty acids 1000 MG capsule Take 1 g by mouth daily.   Glucosamine-Chondroitin (MOVE FREE PO) Take 1 tablet by mouth daily.   metoprolol succinate (TOPROL-XL) 25 MG 24 hr tablet TAKE 1 TABLET(25 MG) BY MOUTH DAILY   Multiple Vitamin (MULTIVITAMIN) tablet Take 1 tablet by mouth daily.   nitroGLYCERIN (NITROSTAT) 0.4 MG SL tablet Place 1 tablet (0.4 mg total) under the tongue every 5 (five) minutes as needed for chest pain.   Probiotic Product (PROBIOTIC ADVANCED PO) Take 1 capsule by mouth daily.   simvastatin (ZOCOR) 40 MG tablet TAKE 1 TABLET BY MOUTH DAILY AT 6 PM   tamsulosin (FLOMAX) 0.4 MG CAPS capsule TAKE 1 CAPSULE(0.4 MG) BY MOUTH DAILY   No facility-administered encounter medications on file as of 05/25/2022.    Recent vitals BP Readings from Last 3 Encounters:  05/24/22 128/70  04/27/22 125/61  04/12/22 (!) 140/74   Pulse Readings from Last 3 Encounters:  05/24/22 77  04/27/22 65  04/12/22 65   Wt Readings from Last 3 Encounters:  05/24/22 197 lb (89.4 kg)  03/01/22 200 lb (90.7 kg)  02/08/22 201 lb (91.2 kg)   BMI Readings from Last 3 Encounters:  05/24/22 25.99 kg/m  03/01/22 26.39 kg/m  02/08/22 26.52 kg/m    Recent lab results    Component Value Date/Time   NA 136 07/15/2021 1416   NA 138 01/16/2020 0922   K 4.3 07/15/2021 1416   CL 102 07/15/2021  1416   CO2 27 07/15/2021 1416   GLUCOSE 145 (H) 07/15/2021 1416   BUN 17 07/15/2021 1416   BUN 19 01/16/2020 0922   CREATININE 1.32 07/15/2021 1416   CALCIUM 9.2 07/15/2021 1416    Lab Results  Component Value Date   CREATININE 1.32 07/15/2021   GFR 50.05 (L) 07/15/2021   GFRNONAA 59 (L) 01/16/2020   GFRAA 68 01/16/2020   Lab Results  Component Value Date/Time   HGBA1C 5.7 01/05/2022 04:04 PM   HGBA1C 6.3 07/15/2021 02:16 PM   HGBA1C 6.3 12/03/2020 08:37 AM    Lab Results  Component Value Date   CHOL 122 12/03/2020   HDL 61.10 12/03/2020   LDLCALC 52 12/03/2020   TRIG 47.0  12/03/2020   CHOLHDL 2 12/03/2020    Care Gaps: AWV-  03/04/22  Star Rating Drugs:  Simvastatin 40mg  - Last filled 03/05/22 90 DS at [facility]        Pamala Duffel CMA Clinical Pharmacist Assistant [phone]

## 2022-06-03 NOTE — Progress Notes (Signed)
Assessment/Plan:    Tremor -No evidence of any neurodegenerative process today and I reassured him that I saw no evidence of Parkinson's disease. 2.  Chronic left foot drop due to L5 radiculopathy             -MRI lumbar spine with significant central and neuroforaminal stenosis  -Patient is following with sports medicine now and receiving injections.  He has seen neurosurgery in the past and told him that he may need to follow back up with them   3.  Idiopathic peripheral neuropathy             -This was identified several years ago and EMG             -This, along with the left foot drop and degenerative changes on MRI all the way back in 2019 are likely the source of gait instability.  We discussed this again in detail today.  He felt that perhaps his gait instability was due to a central nervous system problem, but I told him that I did not see evidence of that.             -Discussed with patient that the only thing really that would help gait instability would be physical therapy.   4.  Memory change             -My suspicion for a neurodegenerative process right now is fairly low.  He stated that he was not so sure about that.  I offered neurocognitive testing again and reminded him that he declined last year.  He stated that this was because we had such a long wait (which is true, but would have been done by today's visit).  Nonetheless, he does not want to proceed with neurocognitive testing.  We discussed that the newer medications that he wanted to discuss such as leqembi would require an accurate diagnosis, along with lumbar puncture and/or PET scanning.    Subjective:   Collin [name] was seen today in follow up for tremor.  I last saw him in August, 2023.  He had just a little bit of rest tremor at that point in time, but was not bradykinetic and did not meet criteria for the diagnosis of Parkinson's disease.  We decided to just follow him that clinically, which is why he is  following today.  He reports that tremor is not bothersome.  He rarely notices it, unless he is eating something like peas. medical records have been reviewed since our last visit.  He has been following with sports medicine, Dr. Jean Rosenthal, since our last visit.  He has been following with physical therapy as well regarding his chronic foot drop.  They trialed him in a brace.  He states that he wears it "some" but its "hard to get in the shoe and its not worth the effort."  No falls.  He had repeat MRI of the lumbar spine in February, 2024.  There was worsened moderate central canal stenosis and severe bilateral neuroforaminal stenosis at L3-L4, severe right and moderate left neuroforaminal stenosis at L4-L5 and severe left neuroforaminal stenosis at L5-S1.   ALLERGIES:   Allergies  Allergen Reactions   Penicillins Rash and Other (See Comments)    PATIENT HAS HAD A PCN REACTION WITH IMMEDIATE RASH, FACIAL/TONGUE/THROAT SWELLING, SOB, OR LIGHTHEADEDNESS WITH HYPOTENSION:  #  #  YES  #  #  Has patient had a PCN reaction causing severe rash involving mucus membranes or skin  necrosis: No Has patient had a PCN reaction that required hospitalization: No Has patient had a PCN reaction occurring within the last 10 years: No If all of the above answers are "NO", then may proceed with Cephalosporin use.    CURRENT MEDICATIONS:  Current Meds  Medication Sig   aspirin 81 MG tablet Take 1 tablet (81 mg total) by mouth at bedtime.   Cholecalciferol (VITAMIN D3) 125 MCG (5000 UT) CAPS Take 5,000 Units by mouth at bedtime.   Coenzyme Q10 300 MG CAPS Take 300 mg by mouth daily.   Cyanocobalamin (VITAMIN B-12) 2500 MCG SUBL Place 1 tablet under the tongue. Taking 5 times a week   fish oil-omega-3 fatty acids 1000 MG capsule Take 1 g by mouth daily.   Glucosamine-Chondroitin (MOVE FREE PO) Take 1 tablet by mouth daily.   metoprolol succinate (TOPROL-XL) 25 MG 24 hr tablet TAKE 1 TABLET(25 MG) BY MOUTH DAILY    Multiple Vitamin (MULTIVITAMIN) tablet Take 1 tablet by mouth daily.   nitroGLYCERIN (NITROSTAT) 0.4 MG SL tablet Place 1 tablet (0.4 mg total) under the tongue every 5 (five) minutes as needed for chest pain.   Probiotic Product (PROBIOTIC ADVANCED PO) Take 1 capsule by mouth daily.   simvastatin (ZOCOR) 40 MG tablet TAKE 1 TABLET BY MOUTH DAILY AT 6 PM   tamsulosin (FLOMAX) 0.4 MG CAPS capsule TAKE 1 CAPSULE(0.4 MG) BY MOUTH DAILY     Objective:   PHYSICAL EXAMINATION:    VITALS:   Vitals:   06/08/22 1522  BP: 130/70  Pulse: 68  SpO2: 98%  Weight: 195 lb (88.5 kg)  Height: 6\' 1"  (1.854 m)    GEN:  The patient appears stated age and is in NAD. HEENT:  Normocephalic, atraumatic.  The mucous membranes are moist. The superficial temporal arteries are without ropiness or tenderness. CV:  RRR Lungs:  CTAB Neck/HEME:  There are no carotid bruits bilaterally.  Neurological examination:  Orientation: The patient is alert and oriented x3.  Cranial nerves: There is good facial symmetry.  Extraocular muscles are intact. The visual fields are full to confrontational testing. The speech is fluent and clear. Soft palate rises symmetrically and there is no tongue deviation. Hearing is intact to conversational tone. Sensation: Sensation is intact to light touch throughout. Motor: Strength is 5/5 in the bilateral upper and lower extremities, with the exception of left ankle dorsiflexor and strength there is 3/5.   Shoulder shrug is equal and symmetric.  There is no pronator drift.  Movement examination: Tone: There is nl tone in the bilateral upper extremities.  The tone in the lower extremities is nl.  Abnormal movements: No rest tremor or postural tremors noted today. Coordination:  There is no decremation with RAM's, with any form of RAMS, including alternating supination and pronation of the forearm, hand opening and closing, finger taps, heel taps and toe taps.  He has trouble with foot  taps on the L due to foot drop on the L Gait and Station: The patient has no difficulty arising out of a deep-seated chair without the use of the hands.  He is slightly wide-based.  He has some foot drop on the left.    I have reviewed and interpreted the following labs independently    Chemistry      Component Value Date/Time   NA 136 07/15/2021 1416   NA 138 01/16/2020 0922   K 4.3 07/15/2021 1416   CL 102 07/15/2021 1416   CO2 27 07/15/2021  1416   BUN 17 07/15/2021 1416   BUN 19 01/16/2020 0922   CREATININE 1.32 07/15/2021 1416      Component Value Date/Time   CALCIUM 9.2 07/15/2021 1416   ALKPHOS 39 12/03/2020 0837   AST 31 12/03/2020 0837   ALT 24 12/03/2020 0837   BILITOT 0.9 12/03/2020 0837       Lab Results  Component Value Date   WBC 8.6 01/01/2020   HGB 15.1 01/01/2020   HCT 44.7 01/01/2020   MCV 87 01/01/2020   PLT 200 01/01/2020    Lab Results  Component Value Date   TSH 2.00 07/15/2021     Total time spent on today's visit was 23 minutes, including both face-to-face time and nonface-to-face time.  Time included that spent on review of records (prior notes available to me/labs/imaging if pertinent), discussing treatment and goals, answering patient's questions and coordinating care.  Cc:  [place], Betty G, MD

## 2022-06-04 ENCOUNTER — Ambulatory Visit: Payer: PPO | Admitting: Neurology

## 2022-06-08 ENCOUNTER — Ambulatory Visit: Payer: PPO | Admitting: Neurology

## 2022-06-08 ENCOUNTER — Encounter: Payer: Self-pay | Admitting: Neurology

## 2022-06-08 VITALS — BP 130/70 | HR 68 | Ht 73.0 in | Wt 195.0 lb

## 2022-06-08 DIAGNOSIS — G609 Hereditary and idiopathic neuropathy, unspecified: Secondary | ICD-10-CM

## 2022-06-08 DIAGNOSIS — M5416 Radiculopathy, lumbar region: Secondary | ICD-10-CM | POA: Diagnosis not present

## 2022-06-12 ENCOUNTER — Other Ambulatory Visit: Payer: Self-pay | Admitting: Cardiovascular Disease

## 2022-08-22 ENCOUNTER — Other Ambulatory Visit: Payer: Self-pay | Admitting: Cardiovascular Disease

## 2022-08-22 DIAGNOSIS — I2510 Atherosclerotic heart disease of native coronary artery without angina pectoris: Secondary | ICD-10-CM

## 2022-08-24 DIAGNOSIS — L821 Other seborrheic keratosis: Secondary | ICD-10-CM | POA: Diagnosis not present

## 2022-08-24 DIAGNOSIS — Z85828 Personal history of other malignant neoplasm of skin: Secondary | ICD-10-CM | POA: Diagnosis not present

## 2022-08-24 DIAGNOSIS — D225 Melanocytic nevi of trunk: Secondary | ICD-10-CM | POA: Diagnosis not present

## 2022-08-24 DIAGNOSIS — L570 Actinic keratosis: Secondary | ICD-10-CM | POA: Diagnosis not present

## 2022-08-24 DIAGNOSIS — L814 Other melanin hyperpigmentation: Secondary | ICD-10-CM | POA: Diagnosis not present

## 2022-08-24 DIAGNOSIS — L578 Other skin changes due to chronic exposure to nonionizing radiation: Secondary | ICD-10-CM | POA: Diagnosis not present

## 2022-09-01 ENCOUNTER — Encounter (HOSPITAL_COMMUNITY): Payer: Self-pay | Admitting: Anesthesiology

## 2022-09-01 ENCOUNTER — Inpatient Hospital Stay (HOSPITAL_COMMUNITY): Payer: PPO | Admitting: Anesthesiology

## 2022-09-01 ENCOUNTER — Inpatient Hospital Stay (HOSPITAL_COMMUNITY)
Admission: EM | Disposition: A | Discharge status: Rehabilitation Facility | Payer: Self-pay | Source: Home / Self Care | Attending: Thoracic Surgery (Cardiothoracic Vascular Surgery)

## 2022-09-01 ENCOUNTER — Encounter (HOSPITAL_COMMUNITY)
Admission: EM | Disposition: A | Discharge status: Rehabilitation Facility | Payer: Self-pay | Source: Home / Self Care | Attending: Thoracic Surgery (Cardiothoracic Vascular Surgery)

## 2022-09-01 ENCOUNTER — Inpatient Hospital Stay (HOSPITAL_COMMUNITY): Payer: PPO

## 2022-09-01 ENCOUNTER — Inpatient Hospital Stay (HOSPITAL_COMMUNITY)
Admission: EM | Admit: 2022-09-01 | Discharge: 2022-09-09 | DRG: 233 | Disposition: A | Discharge status: Rehabilitation Facility | Payer: PPO | Attending: Thoracic Surgery (Cardiothoracic Vascular Surgery) | Admitting: Thoracic Surgery (Cardiothoracic Vascular Surgery)

## 2022-09-01 ENCOUNTER — Ambulatory Visit (HOSPITAL_COMMUNITY): Admit: 2022-09-01 | Payer: PPO | Admitting: Interventional Cardiology

## 2022-09-01 DIAGNOSIS — R079 Chest pain, unspecified: Secondary | ICD-10-CM | POA: Diagnosis not present

## 2022-09-01 DIAGNOSIS — Z7982 Long term (current) use of aspirin: Secondary | ICD-10-CM

## 2022-09-01 DIAGNOSIS — I2102 ST elevation (STEMI) myocardial infarction involving left anterior descending coronary artery: Secondary | ICD-10-CM | POA: Diagnosis not present

## 2022-09-01 DIAGNOSIS — G629 Polyneuropathy, unspecified: Secondary | ICD-10-CM | POA: Diagnosis not present

## 2022-09-01 DIAGNOSIS — L819 Disorder of pigmentation, unspecified: Secondary | ICD-10-CM | POA: Diagnosis present

## 2022-09-01 DIAGNOSIS — R7303 Prediabetes: Secondary | ICD-10-CM | POA: Diagnosis present

## 2022-09-01 DIAGNOSIS — I480 Paroxysmal atrial fibrillation: Secondary | ICD-10-CM | POA: Diagnosis not present

## 2022-09-01 DIAGNOSIS — E8770 Fluid overload, unspecified: Secondary | ICD-10-CM | POA: Diagnosis not present

## 2022-09-01 DIAGNOSIS — I129 Hypertensive chronic kidney disease with stage 1 through stage 4 chronic kidney disease, or unspecified chronic kidney disease: Secondary | ICD-10-CM | POA: Diagnosis present

## 2022-09-01 DIAGNOSIS — M21372 Foot drop, left foot: Secondary | ICD-10-CM | POA: Diagnosis present

## 2022-09-01 DIAGNOSIS — Z8249 Family history of ischemic heart disease and other diseases of the circulatory system: Secondary | ICD-10-CM

## 2022-09-01 DIAGNOSIS — E785 Hyperlipidemia, unspecified: Secondary | ICD-10-CM | POA: Diagnosis not present

## 2022-09-01 DIAGNOSIS — M549 Dorsalgia, unspecified: Secondary | ICD-10-CM | POA: Diagnosis not present

## 2022-09-01 DIAGNOSIS — D6959 Other secondary thrombocytopenia: Secondary | ICD-10-CM | POA: Diagnosis present

## 2022-09-01 DIAGNOSIS — Z951 Presence of aortocoronary bypass graft: Secondary | ICD-10-CM

## 2022-09-01 DIAGNOSIS — Z880 Allergy status to penicillin: Secondary | ICD-10-CM

## 2022-09-01 DIAGNOSIS — E871 Hypo-osmolality and hyponatremia: Secondary | ICD-10-CM | POA: Diagnosis present

## 2022-09-01 DIAGNOSIS — N400 Enlarged prostate without lower urinary tract symptoms: Secondary | ICD-10-CM | POA: Diagnosis not present

## 2022-09-01 DIAGNOSIS — N1831 Chronic kidney disease, stage 3a: Secondary | ICD-10-CM | POA: Diagnosis not present

## 2022-09-01 DIAGNOSIS — Z841 Family history of disorders of kidney and ureter: Secondary | ICD-10-CM

## 2022-09-01 DIAGNOSIS — R0989 Other specified symptoms and signs involving the circulatory and respiratory systems: Secondary | ICD-10-CM | POA: Diagnosis not present

## 2022-09-01 DIAGNOSIS — G8929 Other chronic pain: Secondary | ICD-10-CM | POA: Diagnosis not present

## 2022-09-01 DIAGNOSIS — K5901 Slow transit constipation: Secondary | ICD-10-CM | POA: Diagnosis not present

## 2022-09-01 DIAGNOSIS — Z9841 Cataract extraction status, right eye: Secondary | ICD-10-CM | POA: Diagnosis not present

## 2022-09-01 DIAGNOSIS — I499 Cardiac arrhythmia, unspecified: Secondary | ICD-10-CM | POA: Diagnosis not present

## 2022-09-01 DIAGNOSIS — I2510 Atherosclerotic heart disease of native coronary artery without angina pectoris: Secondary | ICD-10-CM | POA: Diagnosis present

## 2022-09-01 DIAGNOSIS — R570 Cardiogenic shock: Secondary | ICD-10-CM | POA: Diagnosis not present

## 2022-09-01 DIAGNOSIS — I2119 ST elevation (STEMI) myocardial infarction involving other coronary artery of inferior wall: Secondary | ICD-10-CM

## 2022-09-01 DIAGNOSIS — N179 Acute kidney failure, unspecified: Secondary | ICD-10-CM | POA: Diagnosis present

## 2022-09-01 DIAGNOSIS — Z981 Arthrodesis status: Secondary | ICD-10-CM

## 2022-09-01 DIAGNOSIS — I440 Atrioventricular block, first degree: Secondary | ICD-10-CM | POA: Diagnosis present

## 2022-09-01 DIAGNOSIS — I213 ST elevation (STEMI) myocardial infarction of unspecified site: Secondary | ICD-10-CM

## 2022-09-01 DIAGNOSIS — I10 Essential (primary) hypertension: Secondary | ICD-10-CM | POA: Diagnosis not present

## 2022-09-01 DIAGNOSIS — R5381 Other malaise: Secondary | ICD-10-CM | POA: Diagnosis not present

## 2022-09-01 DIAGNOSIS — R9431 Abnormal electrocardiogram [ECG] [EKG]: Secondary | ICD-10-CM | POA: Diagnosis not present

## 2022-09-01 DIAGNOSIS — I700 Atherosclerosis of aorta: Secondary | ICD-10-CM | POA: Diagnosis not present

## 2022-09-01 DIAGNOSIS — I951 Orthostatic hypotension: Secondary | ICD-10-CM | POA: Diagnosis not present

## 2022-09-01 DIAGNOSIS — D62 Acute posthemorrhagic anemia: Secondary | ICD-10-CM | POA: Diagnosis not present

## 2022-09-01 DIAGNOSIS — Y93B2 Activity, push-ups, pull-ups, sit-ups: Secondary | ICD-10-CM

## 2022-09-01 DIAGNOSIS — E782 Mixed hyperlipidemia: Secondary | ICD-10-CM

## 2022-09-01 DIAGNOSIS — Z4682 Encounter for fitting and adjustment of non-vascular catheter: Secondary | ICD-10-CM | POA: Diagnosis not present

## 2022-09-01 DIAGNOSIS — I2101 ST elevation (STEMI) myocardial infarction involving left main coronary artery: Secondary | ICD-10-CM | POA: Diagnosis not present

## 2022-09-01 DIAGNOSIS — R918 Other nonspecific abnormal finding of lung field: Secondary | ICD-10-CM | POA: Diagnosis not present

## 2022-09-01 DIAGNOSIS — Z48812 Encounter for surgical aftercare following surgery on the circulatory system: Secondary | ICD-10-CM | POA: Diagnosis not present

## 2022-09-01 DIAGNOSIS — R0689 Other abnormalities of breathing: Secondary | ICD-10-CM | POA: Diagnosis not present

## 2022-09-01 DIAGNOSIS — I252 Old myocardial infarction: Secondary | ICD-10-CM

## 2022-09-01 DIAGNOSIS — Z86010 Personal history of colonic polyps: Secondary | ICD-10-CM | POA: Diagnosis not present

## 2022-09-01 DIAGNOSIS — Z79899 Other long term (current) drug therapy: Secondary | ICD-10-CM | POA: Diagnosis not present

## 2022-09-01 DIAGNOSIS — K5900 Constipation, unspecified: Secondary | ICD-10-CM | POA: Diagnosis not present

## 2022-09-01 DIAGNOSIS — Z823 Family history of stroke: Secondary | ICD-10-CM | POA: Diagnosis not present

## 2022-09-01 DIAGNOSIS — J9811 Atelectasis: Secondary | ICD-10-CM | POA: Diagnosis not present

## 2022-09-01 DIAGNOSIS — Z820 Family history of epilepsy and other diseases of the nervous system: Secondary | ICD-10-CM | POA: Diagnosis not present

## 2022-09-01 DIAGNOSIS — M48061 Spinal stenosis, lumbar region without neurogenic claudication: Secondary | ICD-10-CM | POA: Diagnosis present

## 2022-09-01 DIAGNOSIS — H9190 Unspecified hearing loss, unspecified ear: Secondary | ICD-10-CM | POA: Diagnosis not present

## 2022-09-01 DIAGNOSIS — Z9842 Cataract extraction status, left eye: Secondary | ICD-10-CM

## 2022-09-01 DIAGNOSIS — I4430 Unspecified atrioventricular block: Secondary | ICD-10-CM | POA: Diagnosis not present

## 2022-09-01 HISTORY — PX: CORONARY/GRAFT ACUTE MI REVASCULARIZATION: CATH118305

## 2022-09-01 HISTORY — PX: LEFT HEART CATH AND CORONARY ANGIOGRAPHY: CATH118249

## 2022-09-01 HISTORY — PX: TEE WITHOUT CARDIOVERSION: SHX5443

## 2022-09-01 HISTORY — PX: CORONARY ARTERY BYPASS GRAFT: SHX141

## 2022-09-01 HISTORY — PX: IABP INSERTION: CATH118242

## 2022-09-01 LAB — POCT I-STAT, CHEM 8
BUN: 21 mg/dL (ref 8–23)
BUN: 20 mg/dL (ref 8–23)
BUN: 19 mg/dL (ref 8–23)
BUN: 18 mg/dL (ref 8–23)
BUN: 19 mg/dL (ref 8–23)
Calcium, Ion: 1.21 mmol/L (ref 1.15–1.40)
Calcium, Ion: 1.27 mmol/L (ref 1.15–1.40)
Calcium, Ion: 1.17 mmol/L (ref 1.15–1.40)
Calcium, Ion: 1.07 mmol/L — ABNORMAL LOW (ref 1.15–1.40)
Calcium, Ion: 1.08 mmol/L — ABNORMAL LOW (ref 1.15–1.40)
Chloride: 101 mmol/L (ref 98–111)
Chloride: 102 mmol/L (ref 98–111)
Chloride: 101 mmol/L (ref 98–111)
Chloride: 100 mmol/L (ref 98–111)
Chloride: 100 mmol/L (ref 98–111)
Creatinine, Ser: 1.2 mg/dL (ref 0.61–1.24)
Creatinine, Ser: 1.1 mg/dL (ref 0.61–1.24)
Creatinine, Ser: 1.1 mg/dL (ref 0.61–1.24)
Creatinine, Ser: 1 mg/dL (ref 0.61–1.24)
Creatinine, Ser: 1 mg/dL (ref 0.61–1.24)
Glucose, Bld: 124 mg/dL — ABNORMAL HIGH (ref 70–99)
Glucose, Bld: 113 mg/dL — ABNORMAL HIGH (ref 70–99)
Glucose, Bld: 142 mg/dL — ABNORMAL HIGH (ref 70–99)
Glucose, Bld: 131 mg/dL — ABNORMAL HIGH (ref 70–99)
Glucose, Bld: 159 mg/dL — ABNORMAL HIGH (ref 70–99)
HCT: 44 % (ref 39.0–52.0)
HCT: 42 % (ref 39.0–52.0)
HCT: 36 % — ABNORMAL LOW (ref 39.0–52.0)
HCT: 29 % — ABNORMAL LOW (ref 39.0–52.0)
HCT: 29 % — ABNORMAL LOW (ref 39.0–52.0)
Hemoglobin: 15 g/dL (ref 13.0–17.0)
Hemoglobin: 14.3 g/dL (ref 13.0–17.0)
Hemoglobin: 12.2 g/dL — ABNORMAL LOW (ref 13.0–17.0)
Hemoglobin: 9.9 g/dL — ABNORMAL LOW (ref 13.0–17.0)
Hemoglobin: 9.9 g/dL — ABNORMAL LOW (ref 13.0–17.0)
Potassium: 4.2 mmol/L (ref 3.5–5.1)
Potassium: 4 mmol/L (ref 3.5–5.1)
Potassium: 4.3 mmol/L (ref 3.5–5.1)
Potassium: 5.2 mmol/L — ABNORMAL HIGH (ref 3.5–5.1)
Potassium: 5 mmol/L (ref 3.5–5.1)
Sodium: 135 mmol/L (ref 135–145)
Sodium: 136 mmol/L (ref 135–145)
Sodium: 134 mmol/L — ABNORMAL LOW (ref 135–145)
Sodium: 132 mmol/L — ABNORMAL LOW (ref 135–145)
Sodium: 132 mmol/L — ABNORMAL LOW (ref 135–145)
TCO2: 20 mmol/L — ABNORMAL LOW (ref 22–32)
TCO2: 27 mmol/L (ref 22–32)
TCO2: 24 mmol/L (ref 22–32)
TCO2: 24 mmol/L (ref 22–32)
TCO2: 24 mmol/L (ref 22–32)

## 2022-09-01 LAB — POCT I-STAT EG7
Acid-base deficit: 2 mmol/L (ref 0.0–2.0)
Bicarbonate: 23.4 mmol/L (ref 20.0–28.0)
Calcium, Ion: 1.05 mmol/L — ABNORMAL LOW (ref 1.15–1.40)
HCT: 28 % — ABNORMAL LOW (ref 39.0–52.0)
Hemoglobin: 9.5 g/dL — ABNORMAL LOW (ref 13.0–17.0)
O2 Saturation: 83 %
Potassium: 5.9 mmol/L — ABNORMAL HIGH (ref 3.5–5.1)
Sodium: 132 mmol/L — ABNORMAL LOW (ref 135–145)
TCO2: 25 mmol/L (ref 22–32)
pCO2, Ven: 44.4 mmHg (ref 44–60)
pH, Ven: 7.331 (ref 7.25–7.43)
pO2, Ven: 51 mmHg — ABNORMAL HIGH (ref 32–45)

## 2022-09-01 LAB — POCT I-STAT 7, (LYTES, BLD GAS, ICA,H+H)
Acid-base deficit: 3 mmol/L — ABNORMAL HIGH (ref 0.0–2.0)
Acid-base deficit: 3 mmol/L — ABNORMAL HIGH (ref 0.0–2.0)
Acid-base deficit: 3 mmol/L — ABNORMAL HIGH (ref 0.0–2.0)
Bicarbonate: 23.3 mmol/L (ref 20.0–28.0)
Bicarbonate: 22.4 mmol/L (ref 20.0–28.0)
Bicarbonate: 22 mmol/L (ref 20.0–28.0)
Calcium, Ion: 1.2 mmol/L (ref 1.15–1.40)
Calcium, Ion: 1.03 mmol/L — ABNORMAL LOW (ref 1.15–1.40)
Calcium, Ion: 1.16 mmol/L (ref 1.15–1.40)
HCT: 40 % (ref 39.0–52.0)
HCT: 28 % — ABNORMAL LOW (ref 39.0–52.0)
HCT: 29 % — ABNORMAL LOW (ref 39.0–52.0)
Hemoglobin: 13.6 g/dL (ref 13.0–17.0)
Hemoglobin: 9.5 g/dL — ABNORMAL LOW (ref 13.0–17.0)
Hemoglobin: 9.9 g/dL — ABNORMAL LOW (ref 13.0–17.0)
O2 Saturation: 100 %
O2 Saturation: 100 %
O2 Saturation: 98 %
Patient temperature: 35.8
Potassium: 3.9 mmol/L (ref 3.5–5.1)
Potassium: 4.5 mmol/L (ref 3.5–5.1)
Potassium: 4.4 mmol/L (ref 3.5–5.1)
Sodium: 135 mmol/L (ref 135–145)
Sodium: 132 mmol/L — ABNORMAL LOW (ref 135–145)
Sodium: 135 mmol/L (ref 135–145)
TCO2: 25 mmol/L (ref 22–32)
TCO2: 24 mmol/L (ref 22–32)
TCO2: 23 mmol/L (ref 22–32)
pCO2 arterial: 45.6 mmHg (ref 32–48)
pCO2 arterial: 38.7 mmHg (ref 32–48)
pCO2 arterial: 36.1 mmHg (ref 32–48)
pH, Arterial: 7.317 — ABNORMAL LOW (ref 7.35–7.45)
pH, Arterial: 7.37 (ref 7.35–7.45)
pH, Arterial: 7.388 (ref 7.35–7.45)
pO2, Arterial: 353 mmHg — ABNORMAL HIGH (ref 83–108)
pO2, Arterial: 335 mmHg — ABNORMAL HIGH (ref 83–108)
pO2, Arterial: 95 mmHg (ref 83–108)

## 2022-09-01 LAB — CBC WITH DIFFERENTIAL/PLATELET
Abs Immature Granulocytes: 0.03 10*3/uL (ref 0.00–0.07)
Basophils Absolute: 0 10*3/uL (ref 0.0–0.1)
Basophils Relative: 0 %
Eosinophils Absolute: 0.1 10*3/uL (ref 0.0–0.5)
Eosinophils Relative: 1 %
HCT: 43.6 % (ref 39.0–52.0)
Hemoglobin: 14.7 g/dL (ref 13.0–17.0)
Immature Granulocytes: 0 %
Lymphocytes Relative: 21 %
Lymphs Abs: 1.8 10*3/uL (ref 0.7–4.0)
MCH: 29.2 pg (ref 26.0–34.0)
MCHC: 33.7 g/dL (ref 30.0–36.0)
MCV: 86.7 fL (ref 80.0–100.0)
Monocytes Absolute: 0.7 10*3/uL (ref 0.1–1.0)
Monocytes Relative: 8 %
Neutro Abs: 6 10*3/uL (ref 1.7–7.7)
Neutrophils Relative %: 70 %
Platelets: 208 10*3/uL (ref 150–400)
RBC: 5.03 MIL/uL (ref 4.22–5.81)
RDW: 13.1 % (ref 11.5–15.5)
WBC: 8.7 10*3/uL (ref 4.0–10.5)
nRBC: 0 % (ref 0.0–0.2)

## 2022-09-01 LAB — HEMOGLOBIN AND HEMATOCRIT, BLOOD
HCT: 29.8 % — ABNORMAL LOW (ref 39.0–52.0)
Hemoglobin: 9.8 g/dL — ABNORMAL LOW (ref 13.0–17.0)

## 2022-09-01 LAB — LIPID PANEL
Cholesterol: 133 mg/dL (ref 0–200)
HDL: 59 mg/dL (ref >40)
LDL Cholesterol: 62 mg/dL (ref 0–99)
Total CHOL/HDL Ratio: 2.3 RATIO
Triglycerides: 59 mg/dL (ref <150)
VLDL: 12 mg/dL (ref 0–40)

## 2022-09-01 LAB — COMPREHENSIVE METABOLIC PANEL
ALT: 29 U/L (ref 0–44)
AST: 36 U/L (ref 15–41)
Albumin: 4 g/dL (ref 3.5–5.0)
Alkaline Phosphatase: 44 U/L (ref 38–126)
Anion gap: 13 (ref 5–15)
BUN: 21 mg/dL (ref 8–23)
CO2: 20 mmol/L — ABNORMAL LOW (ref 22–32)
Calcium: 9.1 mg/dL (ref 8.9–10.3)
Chloride: 100 mmol/L (ref 98–111)
Creatinine, Ser: 1.23 mg/dL (ref 0.61–1.24)
GFR, Estimated: 58 mL/min — ABNORMAL LOW (ref >60)
Glucose, Bld: 119 mg/dL — ABNORMAL HIGH (ref 70–99)
Potassium: 4.1 mmol/L (ref 3.5–5.1)
Sodium: 133 mmol/L — ABNORMAL LOW (ref 135–145)
Total Bilirubin: 0.8 mg/dL (ref 0.3–1.2)
Total Protein: 6.9 g/dL (ref 6.5–8.1)

## 2022-09-01 LAB — PROTIME-INR
INR: 1.1 (ref 0.8–1.2)
INR: 1.5 — ABNORMAL HIGH (ref 0.8–1.2)
Prothrombin Time: 14.1 seconds (ref 11.4–15.2)
Prothrombin Time: 18.1 seconds — ABNORMAL HIGH (ref 11.4–15.2)

## 2022-09-01 LAB — CG4 I-STAT (LACTIC ACID): Lactic Acid, Venous: 0.7 mmol/L (ref 0.5–1.9)

## 2022-09-01 LAB — TYPE AND SCREEN
ABO/RH(D): A POS
Antibody Screen: NEGATIVE

## 2022-09-01 LAB — TROPONIN I (HIGH SENSITIVITY): Troponin I (High Sensitivity): 42 ng/L — ABNORMAL HIGH (ref <18)

## 2022-09-01 LAB — CBC
HCT: 32 % — ABNORMAL LOW (ref 39.0–52.0)
Hemoglobin: 10.4 g/dL — ABNORMAL LOW (ref 13.0–17.0)
MCH: 28.2 pg (ref 26.0–34.0)
MCHC: 32.5 g/dL (ref 30.0–36.0)
MCV: 86.7 fL (ref 80.0–100.0)
Platelets: 100 10*3/uL — ABNORMAL LOW (ref 150–400)
RBC: 3.69 MIL/uL — ABNORMAL LOW (ref 4.22–5.81)
RDW: 13 % (ref 11.5–15.5)
WBC: 11.2 10*3/uL — ABNORMAL HIGH (ref 4.0–10.5)
nRBC: 0 % (ref 0.0–0.2)

## 2022-09-01 LAB — POCT ACTIVATED CLOTTING TIME: Activated Clotting Time: 195 seconds

## 2022-09-01 LAB — APTT
aPTT: 31 seconds (ref 24–36)
aPTT: 34 seconds (ref 24–36)

## 2022-09-01 LAB — MRSA NEXT GEN BY PCR, NASAL: MRSA by PCR Next Gen: NOT DETECTED

## 2022-09-01 LAB — GLUCOSE, CAPILLARY
Glucose-Capillary: 123 mg/dL — ABNORMAL HIGH (ref 70–99)
Glucose-Capillary: 113 mg/dL — ABNORMAL HIGH (ref 70–99)
Glucose-Capillary: 103 mg/dL — ABNORMAL HIGH (ref 70–99)

## 2022-09-01 LAB — HEMOGLOBIN A1C
Hgb A1c MFr Bld: 6 % — ABNORMAL HIGH (ref 4.8–5.6)
Mean Plasma Glucose: 125.5 mg/dL

## 2022-09-01 LAB — ECHO INTRAOPERATIVE TEE: Weight: 3139.35 oz

## 2022-09-01 LAB — ABO/RH: ABO/RH(D): A POS

## 2022-09-01 LAB — PLATELET COUNT: Platelets: 146 10*3/uL — ABNORMAL LOW (ref 150–400)

## 2022-09-01 SURGERY — LEFT HEART CATH AND CORONARY ANGIOGRAPHY
Anesthesia: LOCAL

## 2022-09-01 SURGERY — CORONARY ARTERY BYPASS GRAFTING (CABG)
Anesthesia: General | Site: Chest

## 2022-09-01 MED ORDER — HEPARIN 30,000 UNITS/1000 ML (OHS) CELLSAVER SOLUTION
Status: DC
  Filled 2022-09-01: qty 1000

## 2022-09-01 MED ORDER — MIDAZOLAM HCL 2 MG/2ML IJ SOLN
INTRAMUSCULAR | Status: AC
  Filled 2022-09-01: qty 2

## 2022-09-01 MED ORDER — SODIUM CHLORIDE 0.45 % IV SOLN: INTRAVENOUS | Status: DC | PRN

## 2022-09-01 MED ORDER — TRANEXAMIC ACID 1000 MG/10ML IV SOLN
1.5000 mg/kg/h | INTRAVENOUS | Status: AC
  Administered 2022-09-01: 1.5 mg/kg/h via INTRAVENOUS
  Filled 2022-09-01: qty 25

## 2022-09-01 MED ORDER — VERAPAMIL HCL 2.5 MG/ML IV SOLN
INTRAVENOUS | Status: AC
  Filled 2022-09-01: qty 2

## 2022-09-01 MED ORDER — LIDOCAINE HCL (PF) 1 % IJ SOLN
INTRAMUSCULAR | Status: DC | PRN
  Administered 2022-09-01: 20 mL

## 2022-09-01 MED ORDER — LACTATED RINGERS IV SOLN: INTRAVENOUS | Status: DC | PRN

## 2022-09-01 MED ORDER — FENTANYL CITRATE (PF) 250 MCG/5ML IJ SOLN
INTRAMUSCULAR | Status: AC
  Filled 2022-09-01: qty 5

## 2022-09-01 MED ORDER — TRANEXAMIC ACID (OHS) PUMP PRIME SOLUTION
2.0000 mg/kg | INTRAVENOUS | Status: DC
  Filled 2022-09-01: qty 1.78

## 2022-09-01 MED ORDER — LEVOFLOXACIN IN D5W 750 MG/150ML IV SOLN
750.0000 mg | INTRAVENOUS | Status: AC
  Administered 2022-09-02: 750 mg via INTRAVENOUS
  Filled 2022-09-01: qty 150

## 2022-09-01 MED ORDER — FENTANYL CITRATE (PF) 100 MCG/2ML IJ SOLN
INTRAMUSCULAR | Status: AC
  Filled 2022-09-01: qty 2

## 2022-09-01 MED ORDER — ALBUMIN HUMAN 5 % IV SOLN
250.0000 mL | INTRAVENOUS | Status: AC | PRN
  Administered 2022-09-01 – 2022-09-02 (×5): 12.5 g via INTRAVENOUS
  Filled 2022-09-01 (×2): qty 250

## 2022-09-01 MED ORDER — ROCURONIUM BROMIDE 10 MG/ML (PF) SYRINGE
PREFILLED_SYRINGE | INTRAVENOUS | Status: DC | PRN
  Administered 2022-09-01: 100 mg via INTRAVENOUS
  Administered 2022-09-01: 70 mg via INTRAVENOUS

## 2022-09-01 MED ORDER — SODIUM CHLORIDE 0.9% FLUSH
3.0000 mL | Freq: Two times a day (BID) | INTRAVENOUS | Status: DC
  Administered 2022-09-01: 3 mL via INTRAVENOUS

## 2022-09-01 MED ORDER — CEFAZOLIN SODIUM-DEXTROSE 2-4 GM/100ML-% IV SOLN
2.0000 g | INTRAVENOUS | Status: DC
  Filled 2022-09-01 (×2): qty 100

## 2022-09-01 MED ORDER — MIDAZOLAM HCL (PF) 10 MG/2ML IJ SOLN
INTRAMUSCULAR | Status: AC
  Filled 2022-09-01: qty 2

## 2022-09-01 MED ORDER — PROPOFOL 10 MG/ML IV BOLUS
INTRAVENOUS | Status: AC
  Filled 2022-09-01: qty 20

## 2022-09-01 MED ORDER — SODIUM CHLORIDE 0.9 % IV SOLN: 250.0000 mL | INTRAVENOUS | Status: DC

## 2022-09-01 MED ORDER — OXYCODONE HCL 5 MG PO TABS
5.0000 mg | ORAL_TABLET | ORAL | Status: DC | PRN
  Administered 2022-09-02 (×3): 10 mg via ORAL
  Administered 2022-09-02: 5 mg via ORAL
  Filled 2022-09-01 (×3): qty 2
  Filled 2022-09-01: qty 1

## 2022-09-01 MED ORDER — ONDANSETRON HCL 4 MG/2ML IJ SOLN: 4.0000 mg | Freq: Four times a day (QID) | INTRAMUSCULAR | Status: DC | PRN

## 2022-09-01 MED ORDER — PROPOFOL 10 MG/ML IV BOLUS
INTRAVENOUS | Status: DC | PRN
Start: 2022-09-01 — End: 2022-09-01
  Administered 2022-09-01: 60 mg via INTRAVENOUS

## 2022-09-01 MED ORDER — ORAL CARE MOUTH RINSE
15.0000 mL | OROMUCOSAL | Status: DC
  Administered 2022-09-02 (×9): 15 mL via OROMUCOSAL

## 2022-09-01 MED ORDER — MORPHINE SULFATE (PF) 2 MG/ML IV SOLN
1.0000 mg | INTRAVENOUS | Status: DC | PRN
  Administered 2022-09-02: 4 mg via INTRAVENOUS
  Administered 2022-09-02: 2 mg via INTRAVENOUS
  Administered 2022-09-02 (×2): 4 mg via INTRAVENOUS
  Filled 2022-09-01: qty 1
  Filled 2022-09-01 (×3): qty 2

## 2022-09-01 MED ORDER — CEFAZOLIN SODIUM-DEXTROSE 2-3 GM-%(50ML) IV SOLR
INTRAVENOUS | Status: DC | PRN
  Administered 2022-09-01 (×2): 2 g via INTRAVENOUS

## 2022-09-01 MED ORDER — PHENYLEPHRINE 80 MCG/ML (10ML) SYRINGE FOR IV PUSH (FOR BLOOD PRESSURE SUPPORT)
PREFILLED_SYRINGE | INTRAVENOUS | Status: DC | PRN
  Administered 2022-09-01 (×2): 160 ug via INTRAVENOUS
  Administered 2022-09-01 (×2): 80 ug via INTRAVENOUS
  Administered 2022-09-01: 40 ug via INTRAVENOUS
  Administered 2022-09-01: 80 ug via INTRAVENOUS
  Administered 2022-09-01 (×2): 160 ug via INTRAVENOUS
  Administered 2022-09-01: 80 ug via INTRAVENOUS
  Administered 2022-09-01 (×2): 120 ug via INTRAVENOUS

## 2022-09-01 MED ORDER — HEPARIN SODIUM (PORCINE) 1000 UNIT/ML IJ SOLN
INTRAMUSCULAR | Status: DC | PRN
  Administered 2022-09-01: 5000 [IU] via INTRAVENOUS
  Administered 2022-09-01: 4500 [IU] via INTRAVENOUS

## 2022-09-01 MED ORDER — ACETAMINOPHEN 160 MG/5ML PO SOLN: 1000.0000 mg | Freq: Four times a day (QID) | ORAL | Status: AC

## 2022-09-01 MED ORDER — DEXMEDETOMIDINE HCL IN NACL 400 MCG/100ML IV SOLN
0.1000 ug/kg/h | INTRAVENOUS | Status: AC
  Administered 2022-09-01: .5 ug/kg/h via INTRAVENOUS
  Filled 2022-09-01: qty 100

## 2022-09-01 MED ORDER — ASPIRIN 81 MG PO TBEC
81.0000 mg | DELAYED_RELEASE_TABLET | Freq: Every day | ORAL | Status: DC
  Filled 2022-09-01: qty 1

## 2022-09-01 MED ORDER — NITROGLYCERIN IN D5W 200-5 MCG/ML-% IV SOLN
2.0000 ug/min | INTRAVENOUS | Status: AC
  Administered 2022-09-01: 10 ug/min via INTRAVENOUS
  Filled 2022-09-01: qty 250

## 2022-09-01 MED ORDER — MAGNESIUM SULFATE 4 GM/100ML IV SOLN
4.0000 g | Freq: Once | INTRAVENOUS | Status: AC
  Administered 2022-09-01: 4 g via INTRAVENOUS
  Filled 2022-09-01: qty 100

## 2022-09-01 MED ORDER — DOCUSATE SODIUM 100 MG PO CAPS
200.0000 mg | ORAL_CAPSULE | Freq: Every day | ORAL | Status: DC
  Administered 2022-09-03: 200 mg via ORAL
  Filled 2022-09-01: qty 2

## 2022-09-01 MED ORDER — ACETAMINOPHEN 160 MG/5ML PO SOLN
650.0000 mg | Freq: Once | ORAL | Status: AC
  Administered 2022-09-01: 650 mg
  Filled 2022-09-01: qty 20.3

## 2022-09-01 MED ORDER — FENTANYL CITRATE (PF) 100 MCG/2ML IJ SOLN
INTRAMUSCULAR | Status: DC | PRN
  Administered 2022-09-01: 25 ug via INTRAVENOUS

## 2022-09-01 MED ORDER — BISACODYL 10 MG RE SUPP: 10.0000 mg | Freq: Every day | RECTAL | Status: DC

## 2022-09-01 MED ORDER — SODIUM CHLORIDE 0.9 % IV SOLN: 250.0000 mL | INTRAVENOUS | Status: DC | PRN

## 2022-09-01 MED ORDER — VANCOMYCIN HCL IN DEXTROSE 1-5 GM/200ML-% IV SOLN
1000.0000 mg | Freq: Once | INTRAVENOUS | Status: AC
  Administered 2022-09-02: 1000 mg via INTRAVENOUS
  Filled 2022-09-01: qty 200

## 2022-09-01 MED ORDER — IOHEXOL 350 MG/ML SOLN
INTRAVENOUS | Status: DC | PRN
  Administered 2022-09-01: 35 mL

## 2022-09-01 MED ORDER — INSULIN REGULAR(HUMAN) IN NACL 100-0.9 UT/100ML-% IV SOLN
INTRAVENOUS | Status: AC
  Administered 2022-09-01: .8 [IU]/h via INTRAVENOUS
  Filled 2022-09-01: qty 100

## 2022-09-01 MED ORDER — NITROGLYCERIN IN D5W 200-5 MCG/ML-% IV SOLN
INTRAVENOUS | Status: AC | PRN
  Administered 2022-09-01: 10 ug/min via INTRAVENOUS

## 2022-09-01 MED ORDER — AMIODARONE HCL 200 MG PO TABS
400.0000 mg | ORAL_TABLET | Freq: Two times a day (BID) | ORAL | Status: DC
  Administered 2022-09-02 – 2022-09-04 (×5): 400 mg via ORAL
  Filled 2022-09-01 (×5): qty 2

## 2022-09-01 MED ORDER — INSULIN REGULAR(HUMAN) IN NACL 100-0.9 UT/100ML-% IV SOLN
INTRAVENOUS | Status: DC
  Administered 2022-09-01: 0.8 [IU]/h via INTRAVENOUS
  Filled 2022-09-01: qty 100

## 2022-09-01 MED ORDER — EPINEPHRINE HCL 5 MG/250ML IV SOLN IN NS
0.0000 ug/min | INTRAVENOUS | Status: DC
  Filled 2022-09-01: qty 250

## 2022-09-01 MED ORDER — SODIUM CHLORIDE 0.9 % IV SOLN
INTRAVENOUS | Status: AC | PRN
  Administered 2022-09-01: 10 mL/h via INTRAVENOUS

## 2022-09-01 MED ORDER — ALBUMIN HUMAN 5 % IV SOLN: INTRAVENOUS | Status: DC | PRN

## 2022-09-01 MED ORDER — METOPROLOL TARTRATE 25 MG/10 ML ORAL SUSPENSION: 12.5000 mg | Freq: Two times a day (BID) | ORAL | Status: DC

## 2022-09-01 MED ORDER — ACETAMINOPHEN 500 MG PO TABS
1000.0000 mg | ORAL_TABLET | Freq: Four times a day (QID) | ORAL | Status: AC
  Administered 2022-09-02 – 2022-09-06 (×14): 1000 mg via ORAL
  Filled 2022-09-01 (×15): qty 2

## 2022-09-01 MED ORDER — PAPAVERINE HCL 30 MG/ML IJ SOLN
INTRAMUSCULAR | Status: AC
  Filled 2022-09-01: qty 2

## 2022-09-01 MED ORDER — NITROGLYCERIN 0.2 MG/ML ON CALL CATH LAB
INTRAVENOUS | Status: DC | PRN
  Administered 2022-09-01: 20 ug via INTRAVENOUS
  Administered 2022-09-01: 40 ug via INTRAVENOUS

## 2022-09-01 MED ORDER — HEPARIN (PORCINE) IN NACL 1000-0.9 UT/500ML-% IV SOLN
INTRAVENOUS | Status: DC | PRN
  Administered 2022-09-01 (×2): 500 mL

## 2022-09-01 MED ORDER — MIDAZOLAM HCL (PF) 5 MG/ML IJ SOLN
INTRAMUSCULAR | Status: DC | PRN
  Administered 2022-09-01: 3 mg via INTRAVENOUS
  Administered 2022-09-01 (×2): 2 mg via INTRAVENOUS

## 2022-09-01 MED ORDER — POTASSIUM CHLORIDE 2 MEQ/ML IV SOLN
80.0000 meq | INTRAVENOUS | Status: DC
  Filled 2022-09-01: qty 40

## 2022-09-01 MED ORDER — PLASMA-LYTE A IV SOLN
INTRAVENOUS | Status: AC
  Administered 2022-09-01: 500 mL
  Filled 2022-09-01: qty 2.5

## 2022-09-01 MED ORDER — TRAMADOL HCL 50 MG PO TABS
50.0000 mg | ORAL_TABLET | ORAL | Status: DC | PRN
  Administered 2022-09-03 – 2022-09-08 (×3): 50 mg via ORAL
  Filled 2022-09-01 (×3): qty 1

## 2022-09-01 MED ORDER — PROTAMINE SULFATE 10 MG/ML IV SOLN
INTRAVENOUS | Status: DC | PRN
  Administered 2022-09-01: 40 mg via INTRAVENOUS
  Administered 2022-09-01: 260 mg via INTRAVENOUS

## 2022-09-01 MED ORDER — NOREPINEPHRINE 4 MG/250ML-% IV SOLN
INTRAVENOUS | Status: AC
  Filled 2022-09-01: qty 250

## 2022-09-01 MED ORDER — PHENYLEPHRINE HCL-NACL 20-0.9 MG/250ML-% IV SOLN
0.0000 ug/min | INTRAVENOUS | Status: DC
  Administered 2022-09-02: 20 ug/min via INTRAVENOUS
  Filled 2022-09-01: qty 250

## 2022-09-01 MED ORDER — PANTOPRAZOLE SODIUM 40 MG PO TBEC
40.0000 mg | DELAYED_RELEASE_TABLET | Freq: Every day | ORAL | Status: DC
  Administered 2022-09-03 – 2022-09-09 (×7): 40 mg via ORAL
  Filled 2022-09-01 (×7): qty 1

## 2022-09-01 MED ORDER — SODIUM CHLORIDE 0.9% FLUSH: 3.0000 mL | INTRAVENOUS | Status: DC | PRN

## 2022-09-01 MED ORDER — MANNITOL 20 % IV SOLN
INTRAVENOUS | Status: DC
  Filled 2022-09-01: qty 13

## 2022-09-01 MED ORDER — TAMSULOSIN HCL 0.4 MG PO CAPS
0.4000 mg | ORAL_CAPSULE | Freq: Every day | ORAL | Status: DC
  Administered 2022-09-02 – 2022-09-08 (×7): 0.4 mg via ORAL
  Filled 2022-09-01 (×7): qty 1

## 2022-09-01 MED ORDER — MIDAZOLAM HCL 2 MG/2ML IJ SOLN
2.0000 mg | INTRAMUSCULAR | Status: DC | PRN
  Administered 2022-09-02 (×2): 2 mg via INTRAVENOUS
  Filled 2022-09-01 (×4): qty 2

## 2022-09-01 MED ORDER — HEPARIN SODIUM (PORCINE) 1000 UNIT/ML IJ SOLN
INTRAMUSCULAR | Status: AC
  Filled 2022-09-01: qty 10

## 2022-09-01 MED ORDER — CHLORHEXIDINE GLUCONATE 0.12 % MT SOLN
15.0000 mL | OROMUCOSAL | Status: AC
  Administered 2022-09-01: 15 mL via OROMUCOSAL

## 2022-09-01 MED ORDER — POTASSIUM CHLORIDE 10 MEQ/50ML IV SOLN: 10.0000 meq | INTRAVENOUS | Status: AC

## 2022-09-01 MED ORDER — LACTATED RINGERS IV SOLN: INTRAVENOUS | Status: DC

## 2022-09-01 MED ORDER — VASOPRESSIN 20 UNIT/ML IV SOLN
INTRAVENOUS | Status: DC | PRN
Start: 2022-09-01 — End: 2022-09-01
  Administered 2022-09-01: .5 [IU] via INTRAVENOUS

## 2022-09-01 MED ORDER — CHLORHEXIDINE GLUCONATE CLOTH 2 % EX PADS
6.0000 | MEDICATED_PAD | Freq: Every day | CUTANEOUS | Status: DC
  Administered 2022-09-02 – 2022-09-06 (×5): 6 via TOPICAL

## 2022-09-01 MED ORDER — 0.9 % SODIUM CHLORIDE (POUR BTL) OPTIME
TOPICAL | Status: DC | PRN
  Administered 2022-09-01: 4000 mL

## 2022-09-01 MED ORDER — DEXMEDETOMIDINE HCL IN NACL 400 MCG/100ML IV SOLN
0.0000 ug/kg/h | INTRAVENOUS | Status: DC
  Administered 2022-09-02: 0.5 ug/kg/h via INTRAVENOUS
  Filled 2022-09-01: qty 100

## 2022-09-01 MED ORDER — ATORVASTATIN CALCIUM 80 MG PO TABS
80.0000 mg | ORAL_TABLET | Freq: Every day | ORAL | Status: DC
  Administered 2022-09-03 – 2022-09-09 (×7): 80 mg via ORAL
  Filled 2022-09-01 (×8): qty 1

## 2022-09-01 MED ORDER — NITROGLYCERIN IN D5W 200-5 MCG/ML-% IV SOLN
0.0000 ug/min | INTRAVENOUS | Status: DC
  Administered 2022-09-02: 5 ug/min via INTRAVENOUS

## 2022-09-01 MED ORDER — SODIUM CHLORIDE 0.9% FLUSH
3.0000 mL | Freq: Two times a day (BID) | INTRAVENOUS | Status: DC
  Administered 2022-09-02 (×2): 3 mL via INTRAVENOUS

## 2022-09-01 MED ORDER — VANCOMYCIN HCL 1000 MG IV SOLR
INTRAVENOUS | Status: DC | PRN
  Administered 2022-09-01: 1250 mg via INTRAVENOUS

## 2022-09-01 MED ORDER — ACETAMINOPHEN 325 MG PO TABS: 650.0000 mg | ORAL_TABLET | ORAL | Status: DC | PRN

## 2022-09-01 MED ORDER — METOPROLOL TARTRATE 5 MG/5ML IV SOLN: 2.5000 mg | INTRAVENOUS | Status: DC | PRN

## 2022-09-01 MED ORDER — METOCLOPRAMIDE HCL 5 MG/ML IJ SOLN
10.0000 mg | Freq: Four times a day (QID) | INTRAMUSCULAR | Status: AC
  Administered 2022-09-01 – 2022-09-03 (×6): 10 mg via INTRAVENOUS
  Filled 2022-09-01 (×6): qty 2

## 2022-09-01 MED ORDER — PANTOPRAZOLE SODIUM 40 MG IV SOLR
40.0000 mg | Freq: Every day | INTRAVENOUS | Status: AC
  Administered 2022-09-01 – 2022-09-02 (×2): 40 mg via INTRAVENOUS
  Filled 2022-09-01 (×2): qty 10

## 2022-09-01 MED ORDER — METOPROLOL TARTRATE 12.5 MG HALF TABLET
12.5000 mg | ORAL_TABLET | Freq: Two times a day (BID) | ORAL | Status: DC
  Administered 2022-09-02 – 2022-09-05 (×8): 12.5 mg via ORAL
  Filled 2022-09-01 (×8): qty 1

## 2022-09-01 MED ORDER — BISACODYL 5 MG PO TBEC
10.0000 mg | DELAYED_RELEASE_TABLET | Freq: Every day | ORAL | Status: DC
  Administered 2022-09-03 – 2022-09-05 (×3): 10 mg via ORAL
  Filled 2022-09-01 (×4): qty 2

## 2022-09-01 MED ORDER — SODIUM CHLORIDE 0.9% FLUSH
10.0000 mL | Freq: Two times a day (BID) | INTRAVENOUS | Status: DC
  Administered 2022-09-01 – 2022-09-02 (×3): 10 mL

## 2022-09-01 MED ORDER — NITROGLYCERIN 1 MG/10 ML FOR IR/CATH LAB
INTRA_ARTERIAL | Status: AC
  Filled 2022-09-01: qty 10

## 2022-09-01 MED ORDER — VERAPAMIL HCL 2.5 MG/ML IV SOLN
INTRAVENOUS | Status: DC | PRN
  Administered 2022-09-01: 10 mL via INTRA_ARTERIAL

## 2022-09-01 MED ORDER — FENTANYL CITRATE (PF) 250 MCG/5ML IJ SOLN
INTRAMUSCULAR | Status: DC | PRN
  Administered 2022-09-01 (×2): 150 ug via INTRAVENOUS
  Administered 2022-09-01: 250 ug via INTRAVENOUS
  Administered 2022-09-01 (×3): 100 ug via INTRAVENOUS
  Administered 2022-09-01: 50 ug via INTRAVENOUS
  Administered 2022-09-01: 150 ug via INTRAVENOUS
  Administered 2022-09-01 (×2): 100 ug via INTRAVENOUS

## 2022-09-01 MED ORDER — MIDAZOLAM HCL 2 MG/2ML IJ SOLN
INTRAMUSCULAR | Status: DC | PRN
  Administered 2022-09-01: 1 mg via INTRAVENOUS

## 2022-09-01 MED ORDER — HEPARIN SODIUM (PORCINE) 1000 UNIT/ML IJ SOLN
INTRAMUSCULAR | Status: DC | PRN
  Administered 2022-09-01: 30000 [IU] via INTRAVENOUS

## 2022-09-01 MED ORDER — SODIUM CHLORIDE 0.9% FLUSH: 10.0000 mL | INTRAVENOUS | Status: DC | PRN

## 2022-09-01 MED ORDER — MILRINONE LACTATE IN DEXTROSE 20-5 MG/100ML-% IV SOLN
0.3000 ug/kg/min | INTRAVENOUS | Status: DC
  Filled 2022-09-01: qty 100

## 2022-09-01 MED ORDER — ASPIRIN 81 MG PO CHEW
324.0000 mg | CHEWABLE_TABLET | Freq: Once | ORAL | Status: DC
  Filled 2022-09-01: qty 4

## 2022-09-01 MED ORDER — ASPIRIN 325 MG PO TBEC
325.0000 mg | DELAYED_RELEASE_TABLET | Freq: Every day | ORAL | Status: DC
  Administered 2022-09-02 – 2022-09-03 (×2): 325 mg via ORAL
  Filled 2022-09-01 (×2): qty 1

## 2022-09-01 MED ORDER — DEXTROSE 50 % IV SOLN: 0.0000 mL | INTRAVENOUS | Status: DC | PRN

## 2022-09-01 MED ORDER — ROCURONIUM BROMIDE 10 MG/ML (PF) SYRINGE
PREFILLED_SYRINGE | INTRAVENOUS | Status: AC
  Filled 2022-09-01: qty 10

## 2022-09-01 MED ORDER — PAPAVERINE HCL 30 MG/ML IJ SOLN
INTRAMUSCULAR | Status: DC | PRN
  Administered 2022-09-01: 60 mg via INTRAVENOUS

## 2022-09-01 MED ORDER — NITROGLYCERIN 0.4 MG SL SUBL: 0.4000 mg | SUBLINGUAL_TABLET | SUBLINGUAL | Status: DC | PRN

## 2022-09-01 MED ORDER — ORAL CARE MOUTH RINSE: 15.0000 mL | OROMUCOSAL | Status: DC | PRN

## 2022-09-01 MED ORDER — VANCOMYCIN HCL 1000 MG IV SOLR
INTRAVENOUS | Status: AC
  Filled 2022-09-01: qty 20

## 2022-09-01 MED ORDER — SODIUM CHLORIDE 0.9 % IV SOLN: INTRAVENOUS | Status: DC

## 2022-09-01 MED ORDER — TRANEXAMIC ACID (OHS) BOLUS VIA INFUSION
15.0000 mg/kg | INTRAVENOUS | Status: AC
  Administered 2022-09-01: 1335 mg via INTRAVENOUS
  Filled 2022-09-01: qty 1335

## 2022-09-01 MED ORDER — VASOPRESSIN 20 UNIT/ML IV SOLN
INTRAVENOUS | Status: AC
  Filled 2022-09-01: qty 1

## 2022-09-01 MED ORDER — ASPIRIN 81 MG PO CHEW
324.0000 mg | CHEWABLE_TABLET | Freq: Every day | ORAL | Status: DC
  Filled 2022-09-01: qty 4

## 2022-09-01 MED ORDER — CALCIUM CHLORIDE 10 % IV SOLN
INTRAVENOUS | Status: DC | PRN
  Administered 2022-09-01: 500 mg via INTRAVENOUS

## 2022-09-01 MED ORDER — PHENYLEPHRINE HCL-NACL 20-0.9 MG/250ML-% IV SOLN
30.0000 ug/min | INTRAVENOUS | Status: AC
  Administered 2022-09-01: 30 ug/min via INTRAVENOUS
  Filled 2022-09-01: qty 250

## 2022-09-01 MED ORDER — SODIUM CHLORIDE 0.9 % IV SOLN
INTRAVENOUS | Status: DC | PRN
Start: 2022-09-01 — End: 2022-09-01

## 2022-09-01 MED ORDER — NOREPINEPHRINE 4 MG/250ML-% IV SOLN
0.0000 ug/min | INTRAVENOUS | Status: DC
  Filled 2022-09-01: qty 250

## 2022-09-01 SURGICAL SUPPLY — 88 items
ADAPTER CARDIO PERF ANTE/RETRO (ADAPTER) ×3 IMPLANT
ADH SKN CLS APL DERMABOND .7 (GAUZE/BANDAGES/DRESSINGS) ×2
ADPR PRFSN 84XANTGRD RTRGD (ADAPTER) ×2
BAG DECANTER FOR FLEXI CONT (MISCELLANEOUS) ×3 IMPLANT
BLADE CLIPPER SURG (BLADE) ×3 IMPLANT
BLADE MICRO SHARP 3 15 DEG (BLADE) ×3 IMPLANT
BLADE STERNUM SYSTEM 6 (BLADE) ×3 IMPLANT
BNDG CMPR 5X4 KNIT ELC UNQ LF (GAUZE/BANDAGES/DRESSINGS) ×2
BNDG CMPR 6 X 5 YARDS HK CLSR (GAUZE/BANDAGES/DRESSINGS) ×2
BNDG ELASTIC 4INX 5YD STR LF (GAUZE/BANDAGES/DRESSINGS) IMPLANT
BNDG ELASTIC 4X5.8 VLCR STR LF (GAUZE/BANDAGES/DRESSINGS) ×3 IMPLANT
BNDG ELASTIC 6INX 5YD STR LF (GAUZE/BANDAGES/DRESSINGS) IMPLANT
BNDG ELASTIC 6X5.8 VLCR STR LF (GAUZE/BANDAGES/DRESSINGS) ×3 IMPLANT
BNDG GAUZE DERMACEA FLUFF 4 (GAUZE/BANDAGES/DRESSINGS) ×3 IMPLANT
BNDG GZE DERMACEA 4 6PLY (GAUZE/BANDAGES/DRESSINGS) ×2
BOOT SUTURE VASCULAR YLW (MISCELLANEOUS) ×2
CANISTER SUCT 3000ML PPV (MISCELLANEOUS) ×3 IMPLANT
CANNULA MC2 2 STG 36/46 NON-V (CANNULA) IMPLANT
CANNULA NON VENT 20FR 12 (CANNULA) ×3 IMPLANT
CANNULA VESSEL 3MM BLUNT TIP (CANNULA) IMPLANT
CATH RETROPLEGIA CORONARY 14FR (CATHETERS) ×3 IMPLANT
CATH ROBINSON RED A/P 18FR (CATHETERS) ×9 IMPLANT
CATH THOR STR 32F SOFT 20 RADI (CATHETERS) ×6 IMPLANT
CATH THORACIC 28FR RT ANG (CATHETERS) ×3 IMPLANT
CLAMP SUTURE YELLOW 5 PAIRS (MISCELLANEOUS) ×3 IMPLANT
CLIP TI LARGE 6 (CLIP) ×3 IMPLANT
CLIP TI MEDIUM 24 (CLIP) IMPLANT
CLIP TI WIDE RED SMALL 24 (CLIP) IMPLANT
CNTNR URN SCR LID CUP LEK RST (MISCELLANEOUS) ×6 IMPLANT
CONN ST 1/4X3/8 BEN (MISCELLANEOUS) IMPLANT
CONT SPEC 4OZ STRL OR WHT (MISCELLANEOUS) ×4
DERMABOND ADVANCED .7 DNX12 (GAUZE/BANDAGES/DRESSINGS) IMPLANT
DRAPE CV SPLIT W-CLR ANES SCRN (DRAPES) ×3 IMPLANT
DRAPE INCISE IOBAN 66X45 STRL (DRAPES) ×3 IMPLANT
DRAPE PERI GROIN 82X75IN TIB (DRAPES) ×3 IMPLANT
DRSG AQUACEL AG ADV 3.5X10 (GAUZE/BANDAGES/DRESSINGS) ×3 IMPLANT
ELECT BLADE 4.0 EZ CLEAN MEGAD (MISCELLANEOUS) ×2
ELECT REM PT RETURN 9FT ADLT (ELECTROSURGICAL) ×4
ELECTRODE BLDE 4.0 EZ CLN MEGD (MISCELLANEOUS) ×3 IMPLANT
ELECTRODE REM PT RTRN 9FT ADLT (ELECTROSURGICAL) ×6 IMPLANT
FELT TEFLON 1X6 (MISCELLANEOUS) ×3 IMPLANT
GAUZE 4X4 16PLY ~~LOC~~+RFID DBL (SPONGE) IMPLANT
GAUZE SPONGE 4X4 12PLY STRL (GAUZE/BANDAGES/DRESSINGS) ×6 IMPLANT
GAUZE SPONGE 4X4 16PLY XRAY LF (GAUZE/BANDAGES/DRESSINGS) IMPLANT
GLOVE BIO SURGEON STRL SZ 6.5 (GLOVE) IMPLANT
GLOVE BIOGEL PI IND STRL 6.5 (GLOVE) IMPLANT
GLOVE BIOGEL PI IND STRL 7.5 (GLOVE) IMPLANT
GLOVE SURG SS PI 7.5 STRL IVOR (GLOVE) IMPLANT
GLOVE SURG SS PI 8.0 STRL IVOR (GLOVE) IMPLANT
GOWN STRL REUS W/ TWL LRG LVL3 (GOWN DISPOSABLE) ×18 IMPLANT
GOWN STRL REUS W/TWL LRG LVL3 (GOWN DISPOSABLE) ×12
INSERT FOGARTY 61MM (MISCELLANEOUS) IMPLANT
KIT BASIN OR (CUSTOM PROCEDURE TRAY) ×3 IMPLANT
KIT TURNOVER KIT B (KITS) ×3 IMPLANT
KIT VASOVIEW HEMOPRO 2 VH 4000 (KITS) ×3 IMPLANT
MARKER DISTAL GRAFT W/ HOLDER (MISCELLANEOUS) ×6 IMPLANT
NS IRRIG 1000ML POUR BTL (IV SOLUTION) ×15 IMPLANT
PACK E OPEN HEART (SUTURE) ×3 IMPLANT
PACK OPEN HEART (CUSTOM PROCEDURE TRAY) ×3 IMPLANT
PAD ARMBOARD 7.5X6 YLW CONV (MISCELLANEOUS) ×6 IMPLANT
PAD ELECT DEFIB RADIOL ZOLL (MISCELLANEOUS) ×3 IMPLANT
PENCIL BUTTON HOLSTER BLD 10FT (ELECTRODE) ×3 IMPLANT
POSITIONER HEAD DONUT 9IN (MISCELLANEOUS) ×3 IMPLANT
PUNCH AORTIC ROTATE 4.0MM (MISCELLANEOUS) ×3 IMPLANT
SET MPS 3-ND DEL (MISCELLANEOUS) IMPLANT
SPONGE T-LAP 18X18 ~~LOC~~+RFID (SPONGE) IMPLANT
SUPPORT HEART JANKE-BARRON (MISCELLANEOUS) ×3 IMPLANT
SUT MNCRL AB 4-0 PS2 18 (SUTURE) ×6 IMPLANT
SUT PROLENE 4 0 RB 1 (SUTURE) ×4
SUT PROLENE 4 0 SH DA (SUTURE) ×3 IMPLANT
SUT PROLENE 4-0 RB1 .5 CRCL 36 (SUTURE) ×3 IMPLANT
SUT PROLENE 6 0 C 1 30 (SUTURE) IMPLANT
SUT PROLENE 7 0 BV1 MDA (SUTURE) ×3 IMPLANT
SUT PROLENE 8 0 BV175 6 (SUTURE) IMPLANT
SUT STEEL SZ 6 DBL 3X14 BALL (SUTURE) ×6 IMPLANT
SUT VIC AB 0 CTX 36 (SUTURE) ×4
SUT VIC AB 0 CTX36XBRD ANTBCTR (SUTURE) ×6 IMPLANT
SUT VIC AB 2-0 CT1 27 (SUTURE) ×6
SUT VIC AB 2-0 CT1 TAPERPNT 27 (SUTURE) ×6 IMPLANT
SYSTEM SAHARA CHEST DRAIN ATS (WOUND CARE) ×3 IMPLANT
TAG SUTURE CLAMP YLW 5PR (MISCELLANEOUS) ×2
TOWEL GREEN STERILE (TOWEL DISPOSABLE) ×3 IMPLANT
TOWEL GREEN STERILE FF (TOWEL DISPOSABLE) ×3 IMPLANT
TRAY FOLEY SLVR 16FR TEMP STAT (SET/KITS/TRAYS/PACK) ×3 IMPLANT
TUBE CONNECTING 20X1/4 (TUBING) IMPLANT
TUBING LAP HI FLOW INSUFFLATIO (TUBING) ×3 IMPLANT
UNDERPAD 30X36 HEAVY ABSORB (UNDERPADS AND DIAPERS) ×3 IMPLANT
WATER STERILE IRR 1000ML POUR (IV SOLUTION) ×6 IMPLANT

## 2022-09-01 SURGICAL SUPPLY — 14 items
BALLN IABP SENSA PLUS 8F 50CC (BALLOONS) ×1
BALLOON IABP SENS PLUS 8F 50CC (BALLOONS) IMPLANT
CATH 5FR JL3.5 JR4 ANG PIG MP (CATHETERS) IMPLANT
DEVICE RAD COMP TR BAND LRG (VASCULAR PRODUCTS) IMPLANT
ELECT DEFIB PAD ADLT CADENCE (PAD) IMPLANT
GLIDESHEATH SLEND SS 6F .021 (SHEATH) IMPLANT
GUIDEWIRE INQWIRE 1.5J.035X260 (WIRE) IMPLANT
INQWIRE 1.5J .035X260CM (WIRE) ×1
KIT ENCORE 26 ADVANTAGE (KITS) IMPLANT
KIT HEMO VALVE WATCHDOG (MISCELLANEOUS) IMPLANT
KIT MICROPUNCTURE NIT STIFF (SHEATH) IMPLANT
PACK CARDIAC CATHETERIZATION (CUSTOM PROCEDURE TRAY) ×2 IMPLANT
SET ATX-X65L (MISCELLANEOUS) IMPLANT
WIRE EMERALD 3MM-J .035X150CM (WIRE) IMPLANT

## 2022-09-01 NOTE — Anesthesia Procedure Notes (Signed)
Central Venous Catheter Insertion Performed by: Collene Schlichter, MD, anesthesiologist Start/End8/07/2022 5:02 PM, 09/01/2022 5:07 PM Patient location: Pre-op. Preanesthetic checklist: patient identified, IV checked, site marked, risks and benefits discussed, surgical consent, monitors and equipment checked, pre-op evaluation and timeout performed Position: Trendelenburg Hand hygiene performed  and maximum sterile barriers used  Total catheter length 100. PA cath was placed.Swan type:thermodilution PA Cath depth:50 Procedure performed without using ultrasound guided technique. Attempts: 1 Patient tolerated the procedure well with no immediate complications.

## 2022-09-01 NOTE — Transfer of Care (Signed)
Immediate Anesthesia Transfer of Care Note  Patient: Logic Hoock Warth  Procedure(s) Performed: CORONARY ARTERY BYPASS GRAFTING (CABG) times three using left internal mammary artery and right saphenous vein. (Chest) TRANSESOPHAGEAL ECHOCARDIOGRAM  Patient Location: SICU  Anesthesia Type:General  Level of Consciousness: Patient remains intubated per anesthesia plan  Airway & Oxygen Therapy: Patient remains intubated per anesthesia plan and Patient placed on Ventilator (see vital sign flow sheet for setting)  Post-op Assessment: Report given to RN and Post -op Vital signs reviewed and stable  Post vital signs: Reviewed and stable  Last Vitals:  Vitals Value Taken Time  BP    Temp 35.8 C 09/01/22 2100  Pulse 115 09/01/22 2100  Resp 16 09/01/22 2100  SpO2 92 % 09/01/22 2100  Vitals shown include unfiled device data.  Last Pain: There were no vitals filed for this visit.       Complications: No notable events documented.

## 2022-09-01 NOTE — Anesthesia Procedure Notes (Signed)
Procedure Name: Intubation Date/Time: 09/01/2022 4:48 PM  Performed by: Randon Goldsmith, CRNAPre-anesthesia Checklist: Patient identified, Emergency Drugs available, Suction available and Patient being monitored Patient Re-evaluated:Patient Re-evaluated prior to induction Oxygen Delivery Method: Circle system utilized Preoxygenation: Pre-oxygenation with 100% oxygen Induction Type: IV induction and Rapid sequence Laryngoscope Size: Mac and 4 Grade View: Grade I Tube type: Oral Number of attempts: 1 Airway Equipment and Method: Stylet Placement Confirmation: ETT inserted through vocal cords under direct vision, positive ETCO2 and breath sounds checked- equal and bilateral Secured at: 22 cm Tube secured with: Tape Dental Injury: Teeth and Oropharynx as per pre-operative assessment

## 2022-09-01 NOTE — Progress Notes (Deleted)
Coll[name] [address] [place] [address] Phone: [phone]   Assessment and Plan:     There are no diagnoses linked to this encounter.  ***   Pertinent previous records reviewed include ***   Follow Up: ***     Subjective:    Chief Complaint: ***  HPI:   09/01/22 ***  Relevant Historical Information: ***  Additional pertinent review of systems negative.  No current facility-administered medications for this visit. No current outpatient medications on file.  Facility-Administered Medications Ordered in Other Visits:    acetaminophen (TYLENOL) tablet 650 mg, 650 mg, Oral, Q4H PRN, Perlie Gold, PA-C   [START ON 09/02/2022] aspirin EC tablet 81 mg, 81 mg, Oral, Daily, Williams, Evan, PA-C   ceFAZolin (ANCEF) IVPB 2g/100 mL premix, 2 g, Intravenous, To OR, Eugenio Hoes, MD   dexmedetomidine (PRECEDEX) 400 MCG/100ML (4 mcg/mL) infusion, 0.1-0.7 mcg/kg/hr, Intravenous, To OR, Eugenio Hoes, MD   EPINEPHrine (ADRENALIN) 5 mg in NS 250 mL (0.02 mg/mL) premix infusion, 0-10 mcg/min, Intravenous, To OR, Eugenio Hoes, MD   fentaNYL (SUBLIMAZE) injection, , , PRN, Corky Crafts, MD, 25 mcg at 09/01/22 1520   Heparin (Porcine) in NaCl 1000-0.9 UT/500ML-% SOLN, , , PRN, Corky Crafts, MD, 500 mL at 09/01/22 1525   heparin 30,000 units/NS 1000 mL solution for CELLSAVER, , Other, To OR, Eugenio Hoes, MD   heparin sodium (porcine) 2,500 Units, papaverine 30 mg in electrolyte-A (PLASMALYTE-A PH 7.4) 500 mL irrigation, , Irrigation, To OR, Eugenio Hoes, MD   heparin sodium (porcine) injection, , , PRN, Corky Crafts, MD, 5,000 Units at 09/01/22 1601   insulin regular, human (MYXREDLIN) 100 units/ 100 mL infusion, , Intravenous, To OR, Eugenio Hoes, MD   iohexol (OMNIPAQUE) 350 MG/ML injection, , , PRN, Corky Crafts, MD, 35 mL at 09/01/22 1558   [facility] Blood Cardioplegia vial  (lidocaine/magnesium/mannitol 0.26g-4g-6.4g), , Intracoronary, To OR, Eugenio Hoes, MD   [facility] Blood Cardioplegia vial (lidocaine/magnesium/mannitol 0.26g-4g-6.4g), , Intracoronary, To OR, Eugenio Hoes, MD   lidocaine (PF) (XYLOCAINE) 1 % injection, , , PRN, Corky Crafts, MD, 20 mL at 09/01/22 1538   midazolam (VERSED) injection, , , PRN, Corky Crafts, MD, 1 mg at 09/01/22 1520   milrinone (PRIMACOR) 20 MG/100 ML (0.2 mg/mL) infusion, 0.3 mcg/kg/min, Intravenous, To OR, Eugenio Hoes, MD   nitroGLYCERIN (NITROSTAT) SL tablet 0.4 mg, 0.4 mg, Sublingual, Q5 Min x 3 PRN, Perlie Gold, PA-C   nitroGLYCERIN 100 mcg/mL intra-arterial injection, , , ,    nitroGLYCERIN 50 mg in dextrose 5 % 250 mL (0.2 mg/mL) infusion, , , Continuous PRN, Corky Crafts, MD, Last Rate: 3 mL/hr at 09/01/22 1558, 10 mcg/min at 09/01/22 1558   nitroGLYCERIN 50 mg in dextrose 5 % 250 mL (0.2 mg/mL) infusion, 2-200 mcg/min, Intravenous, To OR, Eugenio Hoes, MD   norepinephrine (LEVOPHED) 4mg  in (0.016 mg/mL) premix infusion, 0-40 mcg/min, Intravenous, To OR, Eugenio Hoes, MD   ondansetron [facility]) injection 4 mg, 4 mg, Intravenous, Q6H PRN, Perlie Gold, PA-C   phenylephrine (NEO-SYNEPHRINE) 20mg /NS premix infusion, 30-200 mcg/min, Intravenous, To OR, Eugenio Hoes, MD   potassium chloride injection 80 mEq, 80 mEq, Other, To OR, Eugenio Hoes, MD   Radial Cocktail/Verapamil only, , , PRN, Corky Crafts, MD, 10 mL at 09/01/22 1521   tranexamic acid (CYKLOKAPRON) 2,500 mg in sodium chloride 0.9 % 250 mL (10 mg/mL) infusion, 1.5 mg/kg/hr, Intravenous, To OR, Weldner,  Renae Fickle, MD   tranexamic acid (CYKLOKAPRON) bolus via infusion - over 30 minutes 1,335 mg, 15 mg/kg, Intravenous, To OR, Eugenio Hoes, MD   tranexamic acid (CYKLOKAPRON) pump prime solution 178 mg, 2 mg/kg, Intracatheter, To OR, Eugenio Hoes, MD   vancomycin (VANCOCIN) 1,000 mg in sodium chloride 0.9 % 1,000 mL  irrigation, , Irrigation, To OR, Eugenio Hoes, MD   Objective:     There were no vitals filed for this visit.    There is no height or weight on file to calculate BMI.    Physical Exam:    ***   Electronically signed by:  Collin [name] 4:21 PM 09/01/22

## 2022-09-01 NOTE — Anesthesia Procedure Notes (Signed)
Arterial Line Insertion Start/End8/07/2022 4:40 PM Performed by: Garfield Cornea, CRNA, CRNA  Patient location: Pre-op. Preanesthetic checklist: patient identified, IV checked, site marked, risks and benefits discussed, surgical consent, monitors and equipment checked, pre-op evaluation, timeout performed and anesthesia consent Lidocaine 1% used for infiltration Left, radial was placed Catheter size: 20 G Hand hygiene performed  and maximum sterile barriers used   Attempts: 1 Procedure performed without using ultrasound guided technique. Following insertion, dressing applied and Biopatch. Post procedure assessment: normal and unchanged  Patient tolerated the procedure well with no immediate complications.

## 2022-09-01 NOTE — Hospital Course (Addendum)
Referring: Corky Crafts, MD   PCP:  [place], Betty G, MD   Chief Complaint:   STEMI     History of Present Illness:     Pt is an 82 yo very active gentleman with known CAD sp RCA MI late 90s. Pt followed by DR Excell Seltzer for CAD and has been doing well medically managed. Pt with acute chest pain today at gym and brougth to cath lab with inferior STEMI. Pt now with severe LM disease and open right. Pt with normal LV by LV gram with LVEDP of 21. PT felt with ongoing ST seg changes with IABP to need emergent CABG.  Hospital Course: Mr. Dubray was stabilized and taken to the operating room emergently.  Three-vessel coronary bypass grafting was accomplished. The left internal mammary artery was grafted to the left anterior descending coronary artery. Endoscopically harvested saphenous vein from the right thigh was used to graft the first diagonal and obtuse marginal coronary arteries.  Following the procedure, he separated from cardiopulmonary bypass without difficulty.  He was transferred to the surgical ICU hemodynamically stable with the intraoperative balloon pump in place by way of the right femoral artery counter pulsating at 1:1 ratio.  The patient was weaned and extubated on POD #1.  He required support with Neo-synephrine and Nitroglycerin which was weaned as hemodynamics allowed.  He was weaned from the balloon pump with removal on 09/02/2022. The patient remains in NSR.  He was started on oral Amiodarone for Atrial Fibrillation prophylaxis.  He was diuresed for volume overloaded state.  His chest tubes, arterial lines, pacing wires were removed without difficulty.  He was started on Plavix for ACS on admission.  He had difficulty with sleeping and was started on Melatonin.  He had some mild confusion which resolved without issue.

## 2022-09-01 NOTE — Anesthesia Preprocedure Evaluation (Addendum)
Anesthesia Evaluation  Patient identified by MRN, date of birth, ID band Patient awake    Reviewed: Allergy & Precautions, NPO status , Patient's Chart, lab work & pertinent test results, reviewed documented beta blocker date and time   History of Anesthesia Complications Negative for: history of anesthetic complications  Airway Mallampati: II  TM Distance: >3 FB Neck ROM: Full    Dental  (+) Dental Advisory Given, Caps,    Pulmonary neg pulmonary ROS   Pulmonary exam normal breath sounds clear to auscultation       Cardiovascular hypertension, Pt. on home beta blockers and Pt. on medications + CAD and + Past MI  Normal cardiovascular exam Rhythm:Regular Rate:Normal   Cath - not yet reported, but initial read per H&P - distal LM/ostial circumflex disease with LAD disease    Neuro/Psych negative neurological ROS  negative psych ROS   GI/Hepatic negative GI ROS, Neg liver ROS,,,  Endo/Other   Pre-DM   Renal/GU negative Renal ROS     Musculoskeletal  (+) Arthritis ,    Abdominal   Peds  Hematology negative hematology ROS (+)   Anesthesia Other Findings   Reproductive/Obstetrics                             Anesthesia Physical Anesthesia Plan  ASA: 4 and emergent  Anesthesia Plan: General   Post-op Pain Management:    Induction: Intravenous  PONV Risk Score and Plan: 2 and Treatment may vary due to age or medical condition  Airway Management Planned: Oral ETT  Additional Equipment: Arterial line, CVP, PA Cath, TEE and Ultrasound Guidance Line Placement  Intra-op Plan:   Post-operative Plan: Post-operative intubation/ventilation  Informed Consent: I have reviewed the patients History and Physical, chart, labs and discussed the procedure including the risks, benefits and alternatives for the proposed anesthesia with the patient or authorized representative who has indicated  his/her understanding and acceptance.     Dental advisory given  Plan Discussed with: CRNA and Anesthesiologist  Anesthesia Plan Comments:        Anesthesia Quick Evaluation

## 2022-09-01 NOTE — Anesthesia Procedure Notes (Signed)
Central Venous Catheter Insertion Performed by: Collene Schlichter, MD, anesthesiologist Start/End8/07/2022 4:52 PM, 09/01/2022 5:02 PM Patient location: Pre-op. Preanesthetic checklist: patient identified, IV checked, site marked, risks and benefits discussed, surgical consent, monitors and equipment checked, pre-op evaluation, timeout performed and anesthesia consent Position: Trendelenburg Lidocaine 1% used for infiltration and patient sedated Hand hygiene performed , maximum sterile barriers used  and Seldinger technique used Catheter size: 9 Fr Central line was placed.MAC introducer Procedure performed using ultrasound guided technique. Ultrasound Notes:anatomy identified, needle tip was noted to be adjacent to the nerve/plexus identified, no ultrasound evidence of intravascular and/or intraneural injection and image(s) printed for medical record Attempts: 1 Following insertion, line sutured, dressing applied and Biopatch. Post procedure assessment: free fluid flow, blood return through all ports and no air  Patient tolerated the procedure well with no immediate complications.

## 2022-09-01 NOTE — Progress Notes (Signed)
Called to 2H to remove R radial sheath post op. Prior to sheath removal, site was slightly bruised and puffy. Sheath removed and TR band placed, 13CC's of air, with distal radial pulse 2+ and reverse Barbeau test B. Slight tissue displacement distal to the band after placement. Area was soft and non rigid. RN visualized TR band following placement and patient left in care of RN.

## 2022-09-01 NOTE — OR Nursing (Signed)
1st call SICU charge 1952. 2nd call SICU 2023

## 2022-09-01 NOTE — H&P (Addendum)
Cardiology Admission History and Physical   Patient ID: Collin [name] MRN: [id number]; DOB: 1940/08/16   Admission date: 09/01/2022  PCP:  [place], Betty G, MD   [facility] Providers Cardiologist:  Tonny Bollman, MD        Chief Complaint:  chest pain  Patient Profile:   Collin [name] is a 82 y.o. male with CAD (inferior MI w/ POBA to RCA, mid LAD disease), hypertension, hyperlipidemia, BPH, lumbar disc disease who is being seen 09/01/2022 for the evaluation of ECG concerning for inferior STEMI.  History of Present Illness:   Collin [name] was working out at the gym today (goes 3 days per week) and developed acute onset chest tightness. He was able to drive himself home but continued to have significant discomfort, 7/10 and made the decision to call EMS given his CAD history. Denies shortness of breath, dizziness/lightheadedness, GI upset. Patient took ASA 325mg  at home prior to arrival of EMS. Per EMS personnel, patient's spouse advised that patient seemed to feel a little more fatigued than usual over the weekend.   Patient is generally very active and goes to the gym multiple days per week as well as frequent walks and has not had any recent cardiac symptoms prior to today.    Past Medical History:  Diagnosis Date   Allergy    Arthritis    BPH (benign prostatic hyperplasia)    CAD (coronary artery disease)    Cataract    Diverticulosis of colon    Hx of adenomatous colonic polyps 05/22/2014   Hyperlipidemia    Hypertension    MYOCARDIAL INFARCTION, HX OF 07/20/2006   Qualifier: Diagnosis of  By: Cato Mulligan MD, Bruce      Past Surgical History:  Procedure Laterality Date   CATARACT EXTRACTION Bilateral    Bil   COLONOSCOPY     LUMBAR LAMINECTOMY/DECOMPRESSION MICRODISCECTOMY Left 01/20/2018   Procedure: Left Lumbar five Sacral one extraforaminal decompression;  Surgeon: Tia Alert, MD;  Location: [facility] OR;  Service: Neurosurgery;  Laterality: Left;   PTCA        Medications Prior to Admission: Prior to Admission medications   Medication Sig Start Date End Date Taking? Authorizing Provider  aspirin 81 MG tablet Take 1 tablet (81 mg total) by mouth at bedtime. 01/28/18   Costella, Darci Current, PA-C  Cholecalciferol (VITAMIN D3) 125 MCG (5000 UT) CAPS Take 5,000 Units by mouth at bedtime.    [provider]  Coenzyme Q10 300 MG CAPS Take 300 mg by mouth daily.    [provider]  Cyanocobalamin (VITAMIN B-12) 2500 MCG SUBL Place 1 tablet under the tongue. Taking 5 times a week    [provider]  fish oil-omega-3 fatty acids 1000 MG capsule Take 1 g by mouth daily.    [provider]  Glucosamine-Chondroitin (MOVE FREE PO) Take 1 tablet by mouth daily.    [provider]  metoprolol succinate (TOPROL-XL) 25 MG 24 hr tablet TAKE 1 TABLET(25 MG) BY MOUTH DAILY 08/23/22   Tonny Bollman, MD  Multiple Vitamin (MULTIVITAMIN) tablet Take 1 tablet by mouth daily.    [provider]  nitroGLYCERIN (NITROSTAT) 0.4 MG SL tablet Place 1 tablet (0.4 mg total) under the tongue every 5 (five) minutes as needed for chest pain. 12/28/17   Tonny Bollman, MD  Probiotic Product (PROBIOTIC ADVANCED PO) Take 1 capsule by mouth daily.    [provider]  simvastatin (ZOCOR) 40 MG tablet TAKE 1 TABLET  BY MOUTH DAILY AT 6 PM 06/14/22   Tonny Bollman, MD  tamsulosin [facility]) 0.4 MG CAPS capsule TAKE 1 CAPSULE(0.4 MG) BY MOUTH DAILY 03/02/22   [place], Betty G, MD     Allergies:    Allergies  Allergen Reactions   Penicillins Rash and Other (See Comments)    PATIENT HAS HAD A PCN REACTION WITH IMMEDIATE RASH, FACIAL/TONGUE/THROAT SWELLING, SOB, OR LIGHTHEADEDNESS WITH HYPOTENSION:  #  #  YES  #  #  Has patient had a PCN reaction causing severe rash involving mucus membranes or skin necrosis: No Has patient had a PCN reaction that required hospitalization: No Has patient had a PCN reaction occurring within the last  10 years: No If all of the above answers are "NO", then may proceed with Cephalosporin use.    Social History:   Social History   Socioeconomic History   Marital status: Married    Spouse name: Not on file   Number of children: 3   Years of education: 16   Highest education level: Not on file  Occupational History   Occupation: retired  Tobacco Use   Smoking status: Never   Smokeless tobacco: Never  Vaping Use   Vaping status: Never Used  Substance and Sexual Activity   Alcohol use: Yes    Comment: wine once a week and beer once a week   Drug use: No   Sexual activity: Not on file  Other Topics Concern   Not on file  Social History Narrative   Lives with wife in a 2 story home.  Has 3 children.     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    Education: college.    Occasional beer, no smoking, no drugs   Social Determinants of Health   Financial Resource Strain: Low Risk  (10/16/2021)   Overall Financial Resource Strain (CARDIA)    Difficulty of Paying Living Expenses: Not hard at all  Food Insecurity: No Food Insecurity (04/20/2022)   Hunger Vital Sign    Worried About Running Out of Food in the Last Year: Never true    Ran Out of Food in the Last Year: Never true  Transportation Needs: No Transportation Needs (03/03/2021)   PRAPARE - Administrator, Civil Service (Medical): No    Lack of Transportation (Non-Medical): No  Physical Activity: Sufficiently Active (03/03/2021)   Exercise Vital Sign    Days of Exercise per Week: 6 days    Minutes of Exercise per Session: 90 min  Stress: No Stress Concern Present (03/03/2021)   [employer] of Occupational Health - Occupational Stress Questionnaire    Feeling of Stress : Not at all  Social Connections: Moderately Integrated (01/03/2020)   Social Connection and Isolation Panel [NHANES]    Frequency of Communication with Friends and Family: More than three times a week    Frequency of Social  Gatherings with Friends and Family: Three times a week    Attends Religious Services: More than 4 times per year    Active Member of Clubs or Organizations: No    Attends Banker Meetings: Never    Marital Status: Married  Catering manager Violence: Not At Risk (01/03/2020)   Humiliation, Afraid, Rape, and Kick questionnaire    Fear of Current or Ex-Partner: No    Emotionally Abused: No    Physically Abused: No    Sexually Abused: No    Family History:   The patient's family history includes AAA (  abdominal aortic aneurysm) in his father and paternal uncle; Breast cancer in his daughter; Dementia (age of onset: 16) in his mother; Diabetes in his brother; Heart disease in his father and mother; Hypertension in his sister; Kidney disease in his father; Stroke in his father. There is no history of Colon cancer.    ROS:  Please see the history of present illness.  All other ROS reviewed and negative.     Physical Exam/Data:  There were no vitals filed for this visit. No intake or output data in the 24 hours ending 09/01/22 1541    06/08/2022    3:22 PM 05/24/2022    2:08 PM 03/01/2022    1:58 PM  Last 3 Weights  Weight (lbs) 195 lb 197 lb 200 lb  Weight (kg) 88.451 kg 89.359 kg 90.719 kg     There is no height or weight on file to calculate BMI.  General:  Well nourished, well developed, in no acute distress HEENT: normal Neck: no JVD Vascular: No carotid bruits; Distal pulses 2+ bilaterally   Cardiac:  normal S1, S2; RRR; no murmur  Lungs:  clear to auscultation bilaterally, no wheezing, rhonchi or rales  Abd: soft, nontender, no hepatomegaly  Ext: no edema Musculoskeletal:  No deformities, BUE and BLE strength normal and equal Skin: warm and dry  Neuro:  CNs 2-12 intact, no focal abnormalities noted Psych:  Normal affect    EKG:  The ECG that was done by EMS personnel was personally reviewed and demonstrates inferior lead ST elevation with anterior lead depression.    Relevant CV Studies:  Cath 9/13: mLAD 80; oD1 80, pD2 80; mLCx 50, OM1 50-60; mRCA 30   Laboratory Data:  High Sensitivity Troponin:  No results for input(s): "TROPONINIHS" in the last 720 hours.    ChemistryNo results for input(s): "NA", "K", "CL", "CO2", "GLUCOSE", "BUN", "CREATININE", "CALCIUM", "MG", "GFRNONAA", "GFRAA", "ANIONGAP" in the last 168 hours.  No results for input(s): "PROT", "ALBUMIN", "AST", "ALT", "ALKPHOS", "BILITOT" in the last 168 hours. Lipids No results for input(s): "CHOL", "TRIG", "HDL", "LABVLDL", "LDLCALC", "CHOLHDL" in the last 168 hours. HematologyNo results for input(s): "WBC", "RBC", "HGB", "HCT", "MCV", "MCH", "MCHC", "RDW", "PLT" in the last 168 hours. Thyroid No results for input(s): "TSH", "FREET4" in the last 168 hours. BNPNo results for input(s): "BNP", "PROBNP" in the last 168 hours.  DDimer No results for input(s): "DDIMER" in the last 168 hours.   Radiology/Studies:  No results found.   Assessment and Plan:   Inferior STEMI CAD Hyperlipidemia  Patient with history of remote inferior infarct in 1998, treated with POBA to RCA. Also with mid LAD disease that has been medically managed. Last LHC in September 2013 showed mLAD 80; oD1 80, pD2 80; mLCx 50, OM1 50-60; mRCA 30. Patient has been very stable for many years without anginal symptoms. Normally very tolerant of exertion/exercise. Patient developed severe chest tightness today while at the gym and called EMS when pain persisted at home. ECG with EMS concerning for acute inferior ischemia.  Patient took 325mg  ASA at home No nitroglycerin with EMS due to inferior ischemia pattern Patient taken straight to the cath lab for urgent LHC with Dr. Eldridge Dace. Initial images concerning for distal LM/ostial circumflex disease with LAD disease as well. Anticipate use of balloon pump pending CTS evaluation. No beta blocker obtain echocardiogram Patient has been on Simvastatin with relatively stable LDL  but will plan to transition to high intensity statin today.   Hypertension  Patient  has had stable BP on Toprol XL 25mg . Will hold given balloon pump.    Risk Assessment/Risk Scores:    TIMI Risk Score for ST  Elevation MI:   The patient's TIMI risk score is 4, which indicates a 7.3% risk of all cause mortality at 30 days.       Code Status: Full Code  Severity of Illness: The appropriate patient status for this patient is INPATIENT. Inpatient status is judged to be reasonable and necessary in order to provide the required intensity of service to ensure the patient's safety. The patient's presenting symptoms, physical exam findings, and initial radiographic and laboratory data in the context of their chronic comorbidities is felt to place them at high risk for further clinical deterioration. Furthermore, it is not anticipated that the patient will be medically stable for discharge from the hospital within 2 midnights of admission.   * I certify that at the point of admission it is my clinical judgment that the patient will require inpatient hospital care spanning beyond 2 midnights from the point of admission due to high intensity of service, high risk for further deterioration and high frequency of surveillance required.*   For questions or updates, please contact [facility] Please consult [email or web address] for contact info under     Signed, Perlie Gold, PA-C  09/01/2022 3:41 PM    I have examined the patient and reviewed assessment and plan and discussed with patient.  Agree with above as stated.    I personally reviewed the ECG and made the decision for emergency catheterization.  Cardiac catheter revealed: "Mid LM to Dist LM lesion is 90% stenosed.   Ost Cx to Prox Cx lesion is 99% stenosed.   1st Diag lesion is 50% stenosed.   2nd Diag lesion is 90% stenosed.   Mid LAD lesion is 80% stenosed.   The left ventricular systolic function is normal.   The left  ventricular ejection fraction is 50-55% by visual estimate.   There is no aortic valve stenosis.   Recommend Aspirin post CABG.  Add Plavix when safe from a bleeding standpoint.   Acute inferior MI due to severe ostial circumflex and distal left main stenosis.  Severe mid LAD disease and ostial second diagonal disease.  Patent RCA.  Given the anatomy, will plan for emergent bypass surgery.  IABP was placed in the right femoral artery.  The patient was also started on IV nitroglycerin.  Dr. Leafy Ro has evaluated the patient and will be taking the patient to the OR shortly."  2+ right posterior tibial pulse was verified after balloon pump placement.  When safe from a bleeding standpoint, will need dual antiplatelet therapy after surgery for acute coronary syndrome.  Will need his statin intensity increased from simvastatin 40 mg daily to high-dose rosuvastatin or atorvastatin.  Lance Muss

## 2022-09-01 NOTE — Op Note (Signed)
[address]       [place] [address]             [phone]                                          09/01/2022 Patient:  Collin [name] Pre-Op Dx: STEMI with Left main stenosis and CAD   Post-op Dx:  same Procedure: CABG X 3 with the LIMA to the LAD and RSVG from the aorta to the OM and RSVG from the aorta to the Diag   Endoscopic greater saphenous vein harvest on the Right   Surgeon and Role:      Eugenio Hoes, MD- Primary    * Jillyn Hidden , PA-C - assisting An experienced assistant was required given the complexity of this surgery and the standard of surgical care. The assistant was needed for exposure, dissection, suctioning, retraction of delicate tissues and sutures, instrument exchange and for overall help during this procedure.    Anesthesia  general EBL:  Blood Administration: none Xclamp Time:  44 min Pump Time:   Drains: CT x 3 to the anterior and posterior mediastinum and to the left pleural cavity Wires: 8 Counts: correct   Indications: 82 yo male with known CAD now with STEMI and critical LM stenosis along with additional LAD and OM disease. Needs emergent CABG. Have discussed risks and goals with family and pt and them understand issues and wish to proceed.   Findings: There was normal LV function. The coronaries are very calcified with the LAD being a 1.12mm vessel with distal minimal disease, the Diag was 1.5 with mod distal disease and the OM was a 1.5 mm vessel with mild distal disease. After bypass the LV was normal on IABP support  Operative Technique: Patient was brought to the operating theater and placed on the table in a supine position.  After general anesthesia was obtained with the use of an endotracheal tube chest abdomen and legs were prepped and draped in sterile fashion.  A median sternotomy incision was then created and a sternal mild sternal saw.  Simultaneously from the right lower extremity  assistant obtained reverse saphenous vein graft utilizing Endo technique this wound was closed mostly residual suture. The left internal mammary artery was then harvested and packed in the back muscle splint.  Heparin was delivered.  Pericardial well was developed and the aortic cannulated with a 20 French aortic cannula and a two-stage cannula was placed in the right atrium for venous return.  Antegrade and retrograde cardioplegia catheters were placed in the ascending aorta coronary sinus effectively.  With adequate confirmation of anticoagulation cardiopulmonary bypass was instituted. Aortic cross-clamp was placed.  Cold blood potassium cardioplegia was then delivered antegrade and retrograde for total of 5 minutes following rest.  An additional cold shot of cardioplegia was delivered approximately 20 minutes throughout the cross-clamp.  And a hotshot cardioplegia dose delivered just prior cross-clamp removal. The obtuse marginal coronary was opening lysing a piece of reverse saphenous vein graft and end-to-side anastomosis was constructed utilizing 7-0 Prolene sutures it was flushed and found pneumostatic.  Second piece of reverse saphenous vein graft was then utilized as a end-to-side anastomosis to the diagonal coronary artery was flushed here and found to hemostatic. The mammary was anastomosed to the LAD in an end-to-side fashion using  8-0 Prolene sutures was flushed.  Family hemostatic and the pedicle pexed to the anterior surface of the heart with 5-0 Prolene sutures. Aortic cross-clamp was removed and a partial clamp placed on the ascending aorta and 2 proximal anastomoses were brought out through 4.0 mm aortic punches.  Proximal vein graft markers were utilized. The partial clamp was removed and the vein grafts were de-aired in routine fashion and the proximal and distal anastomoses were examined and were hemostatic. Ventricular and atrial pacing wires were placed about the inferior stab wounds  and secured. The patient was then weaned from cardiopulmonary bypass on inotropic support with intra-aortic balloon pump counterpulsation.  With adequate hemodynamics protamine was delivered and the patient was decannulated and sites oversewn necessary. With adequate hemostasis the chest was reapproximated with interrupted stainless steel wires and presternal subcutaneous tissue and skin were closed multilayer subdermal suture.  Should be known that chest tubes were also brought out through inferior stab wounds and secured.  Sterile dressings were applied.

## 2022-09-01 NOTE — Consult Note (Signed)
[address]       [place] [address]             [phone]           Collin [name] Swan Medical Record #[id number] Date of Birth: Feb 28, 1940  No ref. provider found [place], Betty G, MD  Chief Complaint:   STEMI   History of Present Illness:     Pt is an 82 yo very active gentleman with known CAD sp RCA MI late 90s. Pt followed by DR Excell Seltzer for CAD and has been doing well medically managed. Pt with acute chest pain today at gym and brougth to cath lab with inferior STEMI. Pt now with severe LM disease and open right. Pt with normal LV by LV gram with LVEDP of 21. PT felt with ongoing ST seg changes with IABP to need emergent cabg      Past Medical History:  Diagnosis Date   Allergy    Arthritis    BPH (benign prostatic hyperplasia)    CAD (coronary artery disease)    Cataract    Diverticulosis of colon    Hx of adenomatous colonic polyps 05/22/2014   Hyperlipidemia    Hypertension    MYOCARDIAL INFARCTION, HX OF 07/20/2006   Qualifier: Diagnosis of  By: Cato Mulligan MD, Bruce      Past Surgical History:  Procedure Laterality Date   CATARACT EXTRACTION Bilateral    Bil   COLONOSCOPY     LUMBAR LAMINECTOMY/DECOMPRESSION MICRODISCECTOMY Left 01/20/2018   Procedure: Left Lumbar five Sacral one extraforaminal decompression;  Surgeon: Tia Alert, MD;  Location: [facility] OR;  Service: Neurosurgery;  Laterality: Left;   PTCA      Social History   Tobacco Use  Smoking Status Never  Smokeless Tobacco Never    Social History   Substance and Sexual Activity  Alcohol Use Yes   Comment: wine once a week and beer once a week    Social History   Socioeconomic History   Marital status: Married    Spouse name: Not on file   Number of children: 3   Years of education: 16   Highest education level: Not on file  Occupational History   Occupation: retired  Tobacco Use   Smoking status: Never   Smokeless tobacco: Never  Vaping Use   Vaping  status: Never Used  Substance and Sexual Activity   Alcohol use: Yes    Comment: wine once a week and beer once a week   Drug use: No   Sexual activity: Not on file  Other Topics Concern   Not on file  Social History Narrative   Lives with wife in a 2 story home.  Has 3 children.     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    Education: college.    Occasional beer, no smoking, no drugs   Social Determinants of Health   Financial Resource Strain: Low Risk  (10/16/2021)   Overall Financial Resource Strain (CARDIA)    Difficulty of Paying Living Expenses: Not hard at all  Food Insecurity: No Food Insecurity (04/20/2022)   Hunger Vital Sign    Worried About Running Out of Food in the Last Year: Never true    Ran Out of Food in the Last Year: Never true  Transportation Needs: No Transportation Needs (03/03/2021)   PRAPARE - Administrator, Civil Service (Medical): No    Lack of Transportation (Non-Medical):  No  Physical Activity: Sufficiently Active (03/03/2021)   Exercise Vital Sign    Days of Exercise per Week: 6 days    Minutes of Exercise per Session: 90 min  Stress: No Stress Concern Present (03/03/2021)   [employer] of Occupational Health - Occupational Stress Questionnaire    Feeling of Stress : Not at all  Social Connections: Moderately Integrated (01/03/2020)   Social Connection and Isolation Panel [NHANES]    Frequency of Communication with Friends and Family: More than three times a week    Frequency of Social Gatherings with Friends and Family: Three times a week    Attends Religious Services: More than 4 times per year    Active Member of Clubs or Organizations: No    Attends Banker Meetings: Never    Marital Status: Married  Catering manager Violence: Not At Risk (01/03/2020)   Humiliation, Afraid, Rape, and Kick questionnaire    Fear of Current or Ex-Partner: No    Emotionally Abused: No    Physically Abused: No     Sexually Abused: No    Allergies  Allergen Reactions   Penicillins Rash and Other (See Comments)    PATIENT HAS HAD A PCN REACTION WITH IMMEDIATE RASH, FACIAL/TONGUE/THROAT SWELLING, SOB, OR LIGHTHEADEDNESS WITH HYPOTENSION:  #  #  YES  #  #  Has patient had a PCN reaction causing severe rash involving mucus membranes or skin necrosis: No Has patient had a PCN reaction that required hospitalization: No Has patient had a PCN reaction occurring within the last 10 years: No If all of the above answers are "NO", then may proceed with Cephalosporin use.    Current Facility-Administered Medications  Medication Dose Route Frequency Provider Last Rate Last Admin   acetaminophen (TYLENOL) tablet 650 mg  650 mg Oral Q4H PRN Perlie Gold, PA-C       [START ON 09/02/2022] aspirin EC tablet 81 mg  81 mg Oral Daily Perlie Gold, PA-C       ceFAZolin (ANCEF) IVPB 2g/100 mL premix  2 g Intravenous To OR Eugenio Hoes, MD       dexmedetomidine (PRECEDEX) 400 MCG/100ML (4 mcg/mL) infusion  0.1-0.7 mcg/kg/hr Intravenous To OR Eugenio Hoes, MD       EPINEPHrine (ADRENALIN) 5 mg in NS 250 mL (0.02 mg/mL) premix infusion  0-10 mcg/min Intravenous To OR Eugenio Hoes, MD       fentaNYL (SUBLIMAZE) injection    PRN Corky Crafts, MD   25 mcg at 09/01/22 1520   Heparin (Porcine) in NaCl 1000-0.9 UT/500ML-% SOLN    PRN Corky Crafts, MD   500 mL at 09/01/22 1525   heparin 30,000 units/NS 1000 mL solution for CELLSAVER   Other To OR Eugenio Hoes, MD       heparin sodium (porcine) 2,500 Units, papaverine 30 mg in electrolyte-A (PLASMALYTE-A PH 7.4) 500 mL irrigation   Irrigation To OR Eugenio Hoes, MD       heparin sodium (porcine) injection    PRN Corky Crafts, MD   5,000 Units at 09/01/22 1601   insulin regular, human (MYXREDLIN) 100 units/ 100 mL infusion   Intravenous To OR Eugenio Hoes, MD       iohexol (OMNIPAQUE) 350 MG/ML injection    PRN Corky Crafts, MD   35 mL at 09/01/22  1558   [facility] Blood Cardioplegia vial (lidocaine/magnesium/mannitol 0.26g-4g-6.4g)   Intracoronary To OR Eugenio Hoes, MD       [facility] Blood Cardioplegia  vial (lidocaine/magnesium/mannitol 0.26g-4g-6.4g)   Intracoronary To OR Eugenio Hoes, MD       lidocaine (PF) (XYLOCAINE) 1 % injection    PRN Corky Crafts, MD   20 mL at 09/01/22 1538   midazolam (VERSED) injection    PRN Corky Crafts, MD   1 mg at 09/01/22 1520   milrinone (PRIMACOR) 20 MG/100 ML (0.2 mg/mL) infusion  0.3 mcg/kg/min Intravenous To OR Eugenio Hoes, MD       nitroGLYCERIN (NITROSTAT) SL tablet 0.4 mg  0.4 mg Sublingual Q5 Min x 3 PRN Perlie Gold, PA-C       nitroGLYCERIN 100 mcg/mL intra-arterial injection            nitroGLYCERIN 50 mg in dextrose 5 % 250 mL (0.2 mg/mL) infusion    Continuous PRN Corky Crafts, MD 3 mL/hr at 09/01/22 1558 10 mcg/min at 09/01/22 1558   nitroGLYCERIN 50 mg in dextrose 5 % 250 mL (0.2 mg/mL) infusion  2-200 mcg/min Intravenous To OR Eugenio Hoes, MD       norepinephrine (LEVOPHED) 4mg  in (0.016 mg/mL) premix infusion  0-40 mcg/min Intravenous To OR Eugenio Hoes, MD       ondansetron [facility]) injection 4 mg  4 mg Intravenous Q6H PRN Perlie Gold, PA-C       phenylephrine (NEO-SYNEPHRINE) 20mg /NS premix infusion  30-200 mcg/min Intravenous To OR Eugenio Hoes, MD       potassium chloride injection 80 mEq  80 mEq Other To OR Eugenio Hoes, MD       Radial Cocktail/Verapamil only    PRN Corky Crafts, MD   10 mL at 09/01/22 1521   tranexamic acid (CYKLOKAPRON) 2,500 mg in sodium chloride 0.9 % 250 mL (10 mg/mL) infusion  1.5 mg/kg/hr Intravenous To OR Eugenio Hoes, MD       tranexamic acid (CYKLOKAPRON) bolus via infusion - over 30 minutes 1,335 mg  15 mg/kg Intravenous To OR Eugenio Hoes, MD       tranexamic acid (CYKLOKAPRON) pump prime solution 178 mg  2 mg/kg Intracatheter To OR Eugenio Hoes, MD       vancomycin (VANCOCIN) 1,000 mg in  sodium chloride 0.9 % 1,000 mL irrigation   Irrigation To OR Eugenio Hoes, MD         Family History  Problem Relation Age of Onset   Heart disease Mother        CABG   Dementia Mother 3   Stroke Father    Heart disease Father    Kidney disease Father        renal failure   AAA (abdominal aortic aneurysm) Father    Hypertension Sister    Diabetes Brother    AAA (abdominal aortic aneurysm) Paternal Uncle    Breast cancer Daughter    Colon cancer Neg Hx        Physical Exam: Wt 89 kg   BMI 25.89 kg/m  On exam table Neuro: alert and no focal deficits   Diagnostic Studies & Laboratory data: I have personally reviewed the following studies and agree with the findings     Recent Radiology Findings:   No results found.    Recent Lab Findings: Lab Results  Component Value Date   WBC 8.6 01/01/2020   HGB 15.1 01/01/2020   HCT 44.7 01/01/2020   PLT 200 01/01/2020   GLUCOSE 145 (H) 07/15/2021   CHOL 122 12/03/2020   TRIG 47.0 12/03/2020   HDL 61.10 12/03/2020  LDLCALC 52 12/03/2020   ALT 24 12/03/2020   AST 31 12/03/2020   NA 136 07/15/2021   K 4.3 07/15/2021   CL 102 07/15/2021   CREATININE 1.32 07/15/2021   BUN 17 07/15/2021   CO2 27 07/15/2021   TSH 2.00 07/15/2021   INR 1.03 01/09/2018   HGBA1C 5.7 01/05/2022      Assessment / Plan:     82 yo male with known CAD now with STEMI and critical LM stenosis along with additional LAD and OM disease. Needs emergent CABG. Have discussed risks and goals with family and pt and them understand issues and wish to proceed.   I have spent 60 min in review of the records, viewing studies and in face to face with patient and in coordination of future care    Eugenio Hoes 09/01/2022 4:10 PM

## 2022-09-01 NOTE — Brief Op Note (Signed)
09/01/2022  7:23 PM  PATIENT:  Collin [name]  82 y.o. male  PRE-OPERATIVE DIAGNOSIS: Multivessel coronary artery disease, critical left main coronary artery stenosis, acute ST elevation myocardial infarction  POST-OPERATIVE DIAGNOSIS:  Multivessel coronary artery disease, critical left main coronary artery stenosis, acute ST elevation myocardial infarction  PROCEDURE:   CORONARY ARTERY BYPASS GRAFTING (CABG) times three using left internal mammary artery and right saphenous vein.   LIMA-LAD SVG-D1 SVG-OM  Vein harvest time: Vein prep time:  TRANSESOPHAGEAL ECHOCARDIOGRAM (N/A)  SURGEON:  Eugenio Hoes, MD - Primary  PHYSICIAN ASSISTANT:   ASSISTANTS: Bruins, Brooklyn L, Scrub Person Velvet Bathe, RN, RN First Assistant   ANESTHESIA:   general  EBL:   BLOOD ADMINISTERED:none  DRAINS:  Mediastinal and left pleural drains    COUNTS:  Correct  DICTATION: .Dragon Dictation  PLAN OF CARE: Admit to inpatient   PATIENT DISPOSITION:  ICU - intubated and hemodynamically stable.   Delay start of Pharmacological VTE agent (>24hrs) due to surgical blood loss or risk of bleeding: yes

## 2022-09-01 NOTE — Progress Notes (Signed)
eLink Physician-Brief Progress Note Patient Name: Collin [name] DOB: 1940/02/03 MRN: [id number]   Date of Service  09/01/2022  HPI/Events of Note  82 year old male with known coronary artery disease with previous MI then developed acute chest pain found to have an inferior STEMI.  Given multivessel disease, the patient underwent emergent CABG and was admitted to the ICU postoperatively.  Patient returns to the ICU on the ventilator with stable hemodynamics. Sedated with Dex, off nitroglycerin and phenylephrine infusions. Endotool in place.    CXR with ETT in appropriate position, PA cath in the right pulmonary artery.  eICU Interventions  Continue phenylephrine as needed  Sedated with precedex, analgesia with morphine, oxycodone, and tramadol  Synchronous with the vent, no issues with the IABP, not requiring pacing, chest tubes and mediastinal tubes draining  GI prophylaxis with Pantoprazole IV, transition to PO when extubated DVT prophylaxis with SCDs.   No immediate intervention     Intervention Category Evaluation Type: New Patient Evaluation    09/01/2022, 10:19 PM

## 2022-09-02 ENCOUNTER — Encounter (HOSPITAL_COMMUNITY): Payer: Self-pay | Admitting: Thoracic Surgery (Cardiothoracic Vascular Surgery)

## 2022-09-02 ENCOUNTER — Inpatient Hospital Stay (HOSPITAL_COMMUNITY): Payer: PPO

## 2022-09-02 ENCOUNTER — Ambulatory Visit: Payer: PPO | Admitting: Sports Medicine

## 2022-09-02 DIAGNOSIS — I2101 ST elevation (STEMI) myocardial infarction involving left main coronary artery: Secondary | ICD-10-CM

## 2022-09-02 DIAGNOSIS — R0689 Other abnormalities of breathing: Secondary | ICD-10-CM | POA: Diagnosis not present

## 2022-09-02 DIAGNOSIS — Z951 Presence of aortocoronary bypass graft: Secondary | ICD-10-CM | POA: Diagnosis not present

## 2022-09-02 LAB — GLUCOSE, CAPILLARY
Glucose-Capillary: 124 mg/dL — ABNORMAL HIGH (ref 70–99)
Glucose-Capillary: 125 mg/dL — ABNORMAL HIGH (ref 70–99)
Glucose-Capillary: 139 mg/dL — ABNORMAL HIGH (ref 70–99)
Glucose-Capillary: 143 mg/dL — ABNORMAL HIGH (ref 70–99)
Glucose-Capillary: 147 mg/dL — ABNORMAL HIGH (ref 70–99)
Glucose-Capillary: 161 mg/dL — ABNORMAL HIGH (ref 70–99)
Glucose-Capillary: 70 mg/dL (ref 70–99)

## 2022-09-02 LAB — POCT I-STAT 7, (LYTES, BLD GAS, ICA,H+H)
Acid-base deficit: 6 mmol/L — ABNORMAL HIGH (ref 0.0–2.0)
Acid-base deficit: 5 mmol/L — ABNORMAL HIGH (ref 0.0–2.0)
Acid-base deficit: 6 mmol/L — ABNORMAL HIGH (ref 0.0–2.0)
Bicarbonate: 18.5 mmol/L — ABNORMAL LOW (ref 20.0–28.0)
Bicarbonate: 21.2 mmol/L (ref 20.0–28.0)
Bicarbonate: 19.4 mmol/L — ABNORMAL LOW (ref 20.0–28.0)
Calcium, Ion: 1.05 mmol/L — ABNORMAL LOW (ref 1.15–1.40)
Calcium, Ion: 1.13 mmol/L — ABNORMAL LOW (ref 1.15–1.40)
Calcium, Ion: 1.08 mmol/L — ABNORMAL LOW (ref 1.15–1.40)
HCT: 25 % — ABNORMAL LOW (ref 39.0–52.0)
HCT: 28 % — ABNORMAL LOW (ref 39.0–52.0)
HCT: 29 % — ABNORMAL LOW (ref 39.0–52.0)
Hemoglobin: 8.5 g/dL — ABNORMAL LOW (ref 13.0–17.0)
Hemoglobin: 9.5 g/dL — ABNORMAL LOW (ref 13.0–17.0)
Hemoglobin: 9.9 g/dL — ABNORMAL LOW (ref 13.0–17.0)
O2 Saturation: 99 %
O2 Saturation: 95 %
O2 Saturation: 98 %
Patient temperature: 36
Patient temperature: 36.4
Patient temperature: 36.9
Potassium: 4 mmol/L (ref 3.5–5.1)
Potassium: 4.3 mmol/L (ref 3.5–5.1)
Potassium: 4.3 mmol/L (ref 3.5–5.1)
Sodium: 137 mmol/L (ref 135–145)
Sodium: 134 mmol/L — ABNORMAL LOW (ref 135–145)
Sodium: 135 mmol/L (ref 135–145)
TCO2: 19 mmol/L — ABNORMAL LOW (ref 22–32)
TCO2: 22 mmol/L (ref 22–32)
TCO2: 20 mmol/L — ABNORMAL LOW (ref 22–32)
pCO2 arterial: 31.6 mmHg — ABNORMAL LOW (ref 32–48)
pCO2 arterial: 41.3 mmHg (ref 32–48)
pCO2 arterial: 34.6 mmHg (ref 32–48)
pH, Arterial: 7.37 (ref 7.35–7.45)
pH, Arterial: 7.316 — ABNORMAL LOW (ref 7.35–7.45)
pH, Arterial: 7.355 (ref 7.35–7.45)
pO2, Arterial: 155 mmHg — ABNORMAL HIGH (ref 83–108)
pO2, Arterial: 77 mmHg — ABNORMAL LOW (ref 83–108)
pO2, Arterial: 111 mmHg — ABNORMAL HIGH (ref 83–108)

## 2022-09-02 LAB — POCT ACTIVATED CLOTTING TIME: Activated Clotting Time: 152 seconds

## 2022-09-02 MED ORDER — ENOXAPARIN SODIUM 40 MG/0.4ML IJ SOSY
40.0000 mg | PREFILLED_SYRINGE | Freq: Every day | INTRAMUSCULAR | Status: DC
  Administered 2022-09-02 – 2022-09-03 (×2): 40 mg via SUBCUTANEOUS
  Filled 2022-09-02 (×2): qty 0.4

## 2022-09-02 MED ORDER — NITROGLYCERIN IN D5W 200-5 MCG/ML-% IV SOLN
0.0000 ug/min | INTRAVENOUS | Status: DC
  Administered 2022-09-02: 7 ug/min via INTRAVENOUS

## 2022-09-02 MED ORDER — INSULIN ASPART 100 UNIT/ML IJ SOLN
0.0000 [IU] | INTRAMUSCULAR | Status: DC
  Administered 2022-09-02: 2 [IU] via SUBCUTANEOUS
  Administered 2022-09-02: 4 [IU] via SUBCUTANEOUS
  Administered 2022-09-02: 2 [IU] via SUBCUTANEOUS

## 2022-09-02 MED ORDER — INSULIN ASPART 100 UNIT/ML IJ SOLN
0.0000 [IU] | INTRAMUSCULAR | Status: DC
  Administered 2022-09-02 – 2022-09-03 (×4): 2 [IU] via SUBCUTANEOUS

## 2022-09-02 MED ORDER — FUROSEMIDE 10 MG/ML IJ SOLN
40.0000 mg | Freq: Once | INTRAMUSCULAR | Status: AC
  Administered 2022-09-02: 40 mg via INTRAVENOUS
  Filled 2022-09-02: qty 4

## 2022-09-02 MED FILL — Nitroglycerin IV Soln 100 MCG/ML in D5W: INTRA_ARTERIAL | Qty: 10 | Status: AC

## 2022-09-02 NOTE — Progress Notes (Addendum)
[address]       [place] [address]             [phone]      1 Day Post-Op Procedure(s) (LRB): CORONARY ARTERY BYPASS GRAFTING (CABG) times three using left internal mammary artery and right saphenous vein. (N/A) TRANSESOPHAGEAL ECHOCARDIOGRAM (N/A)  Subjective:  Patient not having much pain.  However states he isn't moving.  Denies N/V  Objective: Vital signs in last 24 hours: Temp:  [96.1 F (35.6 C)-98.4 F (36.9 C)] 98.4 F (36.9 C) (08/08 0700) Pulse Rate:  [0-242] 187 (08/08 0700) Cardiac Rhythm: Sinus bradycardia;Normal sinus rhythm (08/08 0600) Resp:  [13-32] 18 (08/08 0700) BP: (119-187)/(53-95) 120/69 (08/08 0324) SpO2:  [91 %-100 %] 94 % (08/08 0700) Arterial Line BP: (93-176)/(37-85) 122/63 (08/08 0700) FiO2 (%):  [40 %-50 %] 40 % (08/08 0449) Weight:  [89 kg-93.5 kg] 93.5 kg (08/08 0656)  Hemodynamic parameters for last 24 hours: PAP: (17-36)/(8-20) 29/19 CVP:  [4 mmHg-14 mmHg] 10 mmHg CO:  [3.3 L/min-5.7 L/min] 4.6 L/min CI:  [1.5 L/min/m2-2.69 L/min/m2] 2.2 L/min/m2  Intake/Output from previous day: 08/07 0701 - 08/08 0700 In: 5161.9 [I.V.:3099; Blood:463; IV Piggyback:1599.9] Out: 2420 [Urine:1880; Emesis/NG output:50; Chest Tube:490]  General appearance: cooperative, no distress, and slow to respond however just extubated early this morning Heart: regular rate and rhythm Lungs: clear to auscultation bilaterally Abdomen: soft, non-tender; bowel sounds normal; no masses,  no organomegaly Extremities: no edema present, RLE is cool to touch, IABP in right groin.. + pulses Wound: aquacel in place  Lab Results: Recent Labs    09/01/22 2106 09/01/22 2114 09/02/22 0315 09/02/22 0412 09/02/22 0512 09/02/22 0619  WBC 11.2*  --  6.4  --   --   --   HGB 10.4*   < > 9.4*   < > 9.5* 9.9*  HCT 32.0*   < > 28.9*   < > 28.0* 29.0*  PLT 100*  --  101*  --   --   --    < > = values in this interval not displayed.   BMET:   Recent Labs    09/01/22 1525 09/01/22 1655 09/01/22 1957 09/01/22 2114 09/02/22 0315 09/02/22 0412 09/02/22 0512 09/02/22 0619  NA 135  133*   < > 132*   < > 135   < > 134* 135  K 4.2  4.1   < > 5.0   < > 4.2   < > 4.3 4.3  CL 101  100   < > 100  --  108  --   --   --   CO2 20*  --   --   --  19*  --   --   --   GLUCOSE 124*  119*   < > 159*  --  144*  --   --   --   BUN 21  21   < > 19  --  15  --   --   --   CREATININE 1.20  1.23   < > 1.00  --  1.10  --   --   --   CALCIUM 9.1  --   --   --  7.5*  --   --   --    < > = values in this interval not displayed.    PT/INR:  Recent Labs    09/01/22 2106  LABPROT 18.1*  INR 1.5*   ABG  Component Value Date/Time   PHART 7.355 09/02/2022 0619   HCO3 19.4 (L) 09/02/2022 0619   TCO2 20 (L) 09/02/2022 0619   ACIDBASEDEF 6.0 (H) 09/02/2022 0619   O2SAT 98 09/02/2022 0619   CBG (last 3)  Recent Labs    09/02/22 0010 09/02/22 0034 09/02/22 0410  GLUCAP 70 124* 147*    Assessment/Plan: S/P Procedure(s) (LRB): CORONARY ARTERY BYPASS GRAFTING (CABG) times three using left internal mammary artery and right saphenous vein. (N/A) TRANSESOPHAGEAL ECHOCARDIOGRAM (N/A)  CV- currently NSR, remains on Neo @ 10.. will wean as hemodynamics allow, IABP in place.. can hopefully wean and d/c today.. will need Plavix once EPW are removed Pulm- extubated early this morning. CT output 490 since surgery, CXR with atelectasis bilaterally continue IS Renal- creatinine at 1.10 likely due to dye load from catheterization.he has mild edema on exam, hold off on Lasix for now until off Neo.. K is WNL @ 4.3 Expected post operative blood loss anemia, Hgb stable at 9.9 monitor Expected post operative thrombocytopenia, monitor CBGs controlled, patient is not a diabetic, not currently on insulin drip, will transition to SSIP Dispo- patient stable, extubated early this morning, can hopefully wean and d/c IABP today, POD #1 progression orders    LOS: 1 day    [name], PA-C 09/02/2022  Agree with above Will remove IABP with good hemodynamics on 1:3 Lasix this am Remove swan

## 2022-09-02 NOTE — Procedures (Signed)
Extubation Procedure Note  Patient Details:   Name: Collin [name] DOB: Jul 11, 1940 MRN: [id number]   Airway Documentation:  Airway (Active)  Secured at (cm) 22 cm 09/02/22 0322  Measured From Lips 09/02/22 0322  Secured Location Center 09/02/22 0322  Secured By [employer] 09/02/22 0322  Tube Holder Repositioned Yes 09/02/22 0322  Prone position No 09/02/22 0322  Site Condition Dry 09/02/22 0322   Vent end date: 09/02/22 Vent end time: 0524   Evaluation  O2 sats: stable throughout Complications: No apparent complications Patient did tolerate procedure well. Bilateral Breath Sounds: Clear, Diminished   Yes Pt passed all parameters positive cuff leak.  Pt was able to speak his name and he is aware of his surroundings. NIF -30 VC 1.7 pt extubated to 4 lpm nasal cannula.  Pt instructed on incentive spirometer exercise and was able to do 1500 with great effort.    Letitia Caul [facility] 09/02/2022, 5:24 AM

## 2022-09-02 NOTE — Progress Notes (Signed)
Orthopedic Tech Progress Note Patient Details:  Collin [name] Haston 1941-01-20 [id number]  Ortho Devices Type of Ortho Device: Knee Immobilizer Ortho Device/Splint Location: RLE Ortho Device/Splint Interventions: Ordered, Application, Adjustment   Post Interventions Patient Tolerated: Well  [name] 09/02/2022, 12:17 AM

## 2022-09-02 NOTE — Consult Note (Signed)
NAME:  Cason Seagren Bowditch, MRN:  [id number], DOB:  Apr 11, 1940, LOS: 1 ADMISSION DATE:  09/01/2022, CONSULTATION DATE: 09/02/2022 REFERRING MD: Dr. Leafy Ro, CHIEF COMPLAINT: Acute NSTEMI s/p CABG  History of Present Illness:  82 year old male with coronary artery disease, hypertension and hyperlipidemia who developed chest pain while doing push-ups at gym, brought into the emergency department, EKG showed inferior STEMI, underwent cardiac catheter which showed severe left main disease and open RCA, IABP was placed, CT surgery was consulted for evaluation of CABG.  Patient underwent emergent CABG x 3.  Remained intubated and was transferred to ICU.  PCCM was consulted for help evaluation medical management  Pertinent  Medical History   Past Medical History:  Diagnosis Date   Allergy    Arthritis    BPH (benign prostatic hyperplasia)    CAD (coronary artery disease)    Cataract    Diverticulosis of colon    Hx of adenomatous colonic polyps 05/22/2014   Hyperlipidemia    Hypertension    MYOCARDIAL INFARCTION, HX OF 07/20/2006   Qualifier: Diagnosis of  By: Cato Mulligan MD, [facility] Events: Including procedures, antibiotic start and stop dates in addition to other pertinent events     Interim History / Subjective:  As above  Objective   Blood pressure (!) 132/58, pulse 83, temperature 98.4 F (36.9 C), resp. rate 14, weight 93.5 kg, SpO2 94%. PAP: (17-36)/(8-20) 29/20 CVP:  [4 mmHg-14 mmHg] 10 mmHg CO:  [3.3 L/min-5.7 L/min] 4.7 L/min CI:  [1.5 L/min/m2-2.69 L/min/m2] 2.2 L/min/m2  Vent Mode: CPAP;PSV FiO2 (%):  [40 %-50 %] 40 % Set Rate:  [4 bmp-16 bmp] 4 bmp Vt Set:  [640 mL] 640 mL PEEP:  [5 cmH20] 5 cmH20 Pressure Support:  [10 cmH20] 10 cmH20 Plateau Pressure:  [17 cmH20-18 cmH20] 17 cmH20   Intake/Output Summary (Last 24 hours) at 09/02/2022 4098 Last data filed at 09/02/2022 0800 Gross per 24 hour  Intake 5172.6 ml  Output 2560 ml  Net 2612.6 ml   Filed  Weights   09/01/22 1600 09/02/22 0656  Weight: 89 kg 93.5 kg    Examination: General: Crtitically ill-appearing male, orally intubated HEENT: [place]/AT, eyes anicteric.  ETT and OGT in place Neuro: Sedated, not following commands.  Eyes are closed.  Pupils 3 mm bilateral reactive to light Chest: Coarse breath sounds, no wheezes or rhonchi Heart: Regular rate and rhythm, no murmurs or gallops Abdomen: Soft, nondistended, bowel sounds present Skin: No rash  Labs and images were reviewed  Resolved Hospital Problem list     Assessment & Plan:  Coronary artery disease, acute STEMI s/p emergent CABG x 3 Cardiogenic shock status post IABP Continue aspirin and statin Chest tube management TCTS Patient is off Precedex Continue pain control with tramadol, oxycodone and morphine Closely monitor chest tube output Wean IABP and possibly discontinue today  Acute respiratory insufficiency, postop Patient was successfully extubated earlier this morning Continue to titrate nasal cannula oxygen Encourage incentive spirometry  Hypertension Holding antihypertensive for now, as patient is requiring low-dose phenylephrine  Hyperlipidemia Continue atorvastatin  Prediabetes  Patient hemoglobin A1c is 6.0 Currently on insulin infusion, monitor fingerstick with goal 140-180 Transition insulin infusion to sliding scale  Expected perioperative blood loss anemia Thrombocytopenia due to CPB Monitor H/H and PLT counts   Best Practice (right click and "Reselect all SmartList Selections" daily)   Diet/type: Regular consistent DVT prophylaxis: Enoxaparin GI prophylaxis: PPI Lines: Central line, Arterial Line, and yes and  it is still needed Foley:  Yes, and it is still needed Code Status:  full code Last date of multidisciplinary goals of care discussion [Per primary team]   Labs   CBC: Recent Labs  Lab 09/01/22 1525 09/01/22 1655 09/01/22 1908 09/01/22 1957 09/01/22 2106 09/01/22 2114  09/02/22 0315 09/02/22 0412 09/02/22 0512 09/02/22 0619  WBC 8.7  --   --   --  11.2*  --  6.4  --   --   --   NEUTROABS 6.0  --   --   --   --   --   --   --   --   --   HGB 15.0  14.7   < > 9.8*   < > 10.4* 9.9* 9.4* 8.5* 9.5* 9.9*  HCT 44.0  43.6   < > 29.8*   < > 32.0* 29.0* 28.9* 25.0* 28.0* 29.0*  MCV 86.7  --   --   --  86.7  --  87.0  --   --   --   PLT 208  --  146*  --  100*  --  101*  --   --   --    < > = values in this interval not displayed.    Basic Metabolic Panel: Recent Labs  Lab 09/01/22 1525 09/01/22 1655 09/01/22 1658 09/01/22 1810 09/01/22 1832 09/01/22 1856 09/01/22 1957 09/01/22 2114 09/02/22 0315 09/02/22 0412 09/02/22 0512 09/02/22 0619  NA 135  133*   < > 136 134*   < > 132* 132* 135 135 137 134* 135  K 4.2  4.1   < > 4.0 4.3   < > 5.2* 5.0 4.4 4.2 4.0 4.3 4.3  CL 101  100  --  102 101  --  100 100  --  108  --   --   --   CO2 20*  --   --   --   --   --   --   --  19*  --   --   --   GLUCOSE 124*  119*  --  113* 142*  --  131* 159*  --  144*  --   --   --   BUN 21  21  --  20 19  --  18 19  --  15  --   --   --   CREATININE 1.20  1.23  --  1.10 1.10  --  1.00 1.00  --  1.10  --   --   --   CALCIUM 9.1  --   --   --   --   --   --   --  7.5*  --   --   --   MG  --   --   --   --   --   --   --   --  2.8*  --   --   --    < > = values in this interval not displayed.   GFR: Estimated Creatinine Clearance: 56.5 mL/min (by C-G formula based on SCr of 1.1 mg/dL). Recent Labs  Lab 09/01/22 1525 09/01/22 2106 09/02/22 0315  WBC 8.7 11.2* 6.4  LATICACIDVEN 0.7  --   --     Liver Function Tests: Recent Labs  Lab 09/01/22 1525  AST 36  ALT 29  ALKPHOS 44  BILITOT 0.8  PROT 6.9  ALBUMIN 4.0   No results for input(s): "LIPASE", "AMYLASE"  in the last 168 hours. No results for input(s): "AMMONIA" in the last 168 hours.  ABG    Component Value Date/Time   PHART 7.355 09/02/2022 0619   PCO2ART 34.6 09/02/2022 0619   PO2ART 111  (H) 09/02/2022 0619   HCO3 19.4 (L) 09/02/2022 0619   TCO2 20 (L) 09/02/2022 0619   ACIDBASEDEF 6.0 (H) 09/02/2022 0619   O2SAT 98 09/02/2022 0619     Coagulation Profile: Recent Labs  Lab 09/01/22 1525 09/01/22 2106  INR 1.1 1.5*    Cardiac Enzymes: No results for input(s): "CKTOTAL", "CKMB", "CKMBINDEX", "TROPONINI" in the last 168 hours.  HbA1C: HbA1c, POC (prediabetic range)  Date/Time Value Ref Range Status  01/05/2022 04:04 PM 5.7 5.7 - 6.4 % Final   Hgb A1c MFr Bld  Date/Time Value Ref Range Status  09/01/2022 03:25 PM 6.0 (H) 4.8 - 5.6 % Final    Comment:    (NOTE) Pre diabetes:          5.7%-6.4%  Diabetes:              >6.4%  Glycemic control for   <7.0% adults with diabetes   07/15/2021 02:16 PM 6.3 4.6 - 6.5 % Final    Comment:    Glycemic Control Guidelines for People with Diabetes:Non Diabetic:  <6%Goal of Therapy: <7%Additional Action Suggested:  >8%     CBG: Recent Labs  Lab 09/01/22 2208 09/01/22 2305 09/02/22 0010 09/02/22 0034 09/02/22 0410  GLUCAP 113* 103* 70 124* 147*    Review of Systems:   12 point review of systems is significant for complaint mentioned in HPI, rest is negative  Past Medical History:  He,  has a past medical history of Allergy, Arthritis, BPH (benign prostatic hyperplasia), CAD (coronary artery disease), Cataract, Diverticulosis of colon, adenomatous colonic polyps (05/22/2014), Hyperlipidemia, Hypertension, and MYOCARDIAL INFARCTION, HX OF (07/20/2006).   Surgical History:   Past Surgical History:  Procedure Laterality Date   CATARACT EXTRACTION Bilateral    Bil   COLONOSCOPY     CORONARY ARTERY BYPASS GRAFT N/A 09/01/2022   Procedure: CORONARY ARTERY BYPASS GRAFTING (CABG) times three using left internal mammary artery and right saphenous vein.;  Surgeon: Eugenio Hoes, MD;  Location: [facility] OR;  Service: Open Heart Surgery;  Laterality: N/A;   CORONARY/GRAFT ACUTE MI REVASCULARIZATION N/A 09/01/2022   Procedure:  Coronary/Graft Acute MI Revascularization;  Surgeon: Corky Crafts, MD;  Location[facility] INVASIVE CV LAB;  Service: Cardiovascular;  Laterality: N/A;   IABP INSERTION N/A 09/01/2022   Procedure: IABP Insertion;  Surgeon: Corky Crafts, MD;  Location: [facility] INVASIVE CV LAB;  Service: Cardiovascular;  Laterality: N/A;   LEFT HEART CATH AND CORONARY ANGIOGRAPHY N/A 09/01/2022   Procedure: LEFT HEART CATH AND CORONARY ANGIOGRAPHY;  Surgeon: Corky Crafts, MD;  Location: [facility] INVASIVE CV LAB;  Service: Cardiovascular;  Laterality: N/A;   LUMBAR LAMINECTOMY/DECOMPRESSION MICRODISCECTOMY Left 01/20/2018   Procedure: Left Lumbar five Sacral one extraforaminal decompression;  Surgeon: Tia Alert, MD;  Location: [facility] OR;  Service: Neurosurgery;  Laterality: Left;   PTCA     TEE WITHOUT CARDIOVERSION N/A 09/01/2022   Procedure: TRANSESOPHAGEAL ECHOCARDIOGRAM;  Surgeon: Eugenio Hoes, MD;  Location: [facility] OR;  Service: Open Heart Surgery;  Laterality: N/A;     Social History:   reports that he has never smoked. He has never used smokeless tobacco. He reports current alcohol use. He reports that he does not use drugs.   Family History:  His family history includes  AAA (abdominal aortic aneurysm) in his father and paternal uncle; Breast cancer in his daughter; Dementia (age of onset: 67) in his mother; Diabetes in his brother; Heart disease in his father and mother; Hypertension in his sister; Kidney disease in his father; Stroke in his father. There is no history of Colon cancer.   Allergies Allergies  Allergen Reactions   Penicillins Rash and Other (See Comments)    PATIENT HAS HAD A PCN REACTION WITH IMMEDIATE RASH, FACIAL/TONGUE/THROAT SWELLING, SOB, OR LIGHTHEADEDNESS WITH HYPOTENSION:  #  #  YES  #  #  Has patient had a PCN reaction causing severe rash involving mucus membranes or skin necrosis: No Has patient had a PCN reaction that required hospitalization: No Has patient had a PCN reaction  occurring within the last 10 years: No If all of the above answers are "NO", then may proceed with Cephalosporin use.     Home Medications  Prior to Admission medications   Medication Sig Start Date End Date Taking? Authorizing Provider  aspirin 81 MG tablet Take 1 tablet (81 mg total) by mouth at bedtime. 01/28/18   Costella, Darci Current, PA-C  Cholecalciferol (VITAMIN D3) 125 MCG (5000 UT) CAPS Take 5,000 Units by mouth at bedtime.    [provider]  Coenzyme Q10 300 MG CAPS Take 300 mg by mouth daily.    [provider]  Cyanocobalamin (VITAMIN B-12) 2500 MCG SUBL Place 1 tablet under the tongue. Taking 5 times a week    [provider]  fish oil-omega-3 fatty acids 1000 MG capsule Take 1 g by mouth daily.    [provider]  Glucosamine-Chondroitin (MOVE FREE PO) Take 1 tablet by mouth daily.    [provider]  metoprolol succinate (TOPROL-XL) 25 MG 24 hr tablet TAKE 1 TABLET(25 MG) BY MOUTH DAILY 08/23/22   Tonny Bollman, MD  Multiple Vitamin (MULTIVITAMIN) tablet Take 1 tablet by mouth daily.    [provider]  nitroGLYCERIN (NITROSTAT) 0.4 MG SL tablet Place 1 tablet (0.4 mg total) under the tongue every 5 (five) minutes as needed for chest pain. 12/28/17   Tonny Bollman, MD  Probiotic Product (PROBIOTIC ADVANCED PO) Take 1 capsule by mouth daily.    [provider]  simvastatin (ZOCOR) 40 MG tablet TAKE 1 TABLET BY MOUTH DAILY AT 6 PM 06/14/22   Tonny Bollman, MD  tamsulosin [facility]) 0.4 MG CAPS capsule TAKE 1 CAPSULE(0.4 MG) BY MOUTH DAILY 03/02/22   [place], Betty G, MD     Critical care time:     The patient is critically ill due to acute STEMI status post CABG x 3.  Critical care was necessary to treat or prevent imminent or life-threatening deterioration.  Critical care was time spent personally by me on the following activities: development of treatment plan with patient and/or surrogate as well as nursing,  discussions with consultants, evaluation of patient's response to treatment, examination of patient, obtaining history from patient or surrogate, ordering and performing treatments and interventions, ordering and review of laboratory studies, ordering and review of radiographic studies, pulse oximetry, re-evaluation of patient's condition and participation in multidisciplinary rounds.   During this encounter critical care time was devoted to patient care services described in this note for 35 minutes.     Cheri Fowler, MD [facility] See Amion for pager If no response to pager, please call [phone] until 7pm After 7pm, Please call E-link [phone]

## 2022-09-02 NOTE — Progress Notes (Signed)
Progress Note  Patient Name: Collin [name] Date of Encounter: 09/02/2022 Primary Cardiologist: Tonny Bollman, MD   Subjective   Overnight extubated and doing well.  1:3 on IABP with normal index. Patient notes that he is afraid that if he urinates, he will have a nightmare No CP, SOB, Palpitations.  Vital Signs    Vitals:   09/02/22 0656 09/02/22 0700 09/02/22 0734 09/02/22 0800  BP:    (!) 132/58  Pulse:  (!) 187 83   Resp:  18 14   Temp:  98.4 F (36.9 C) 98.4 F (36.9 C)   TempSrc:      SpO2:  94% 94%   Weight: 93.5 kg       Intake/Output Summary (Last 24 hours) at 09/02/2022 0828 Last data filed at 09/02/2022 0800 Gross per 24 hour  Intake 5172.6 ml  Output 2560 ml  Net 2612.6 ml   Filed Weights   09/01/22 1600 09/02/22 0656  Weight: 89 kg 93.5 kg    Physical Exam   GEN: No acute distress.   Neck: Normal CVP at current assessment, normal index Cardiac: RRR, no murmurs, rubs, or gallops.  Respiratory: Clear to auscultation bilaterally. GI: Soft, nontender, non-distended  MS: trace edema   Labs  EKG: A pacing VS; aVR ST depression noted Telemetry: ASVS, Rare V pacing   Chemistry Recent Labs  Lab 09/01/22 1525 09/01/22 1655 09/01/22 1856 09/01/22 1957 09/01/22 2114 09/02/22 0315 09/02/22 0412 09/02/22 0512 09/02/22 0619  NA 135  133*   < > 132* 132*   < > 135 137 134* 135  K 4.2  4.1   < > 5.2* 5.0   < > 4.2 4.0 4.3 4.3  CL 101  100   < > 100 100  --  108  --   --   --   CO2 20*  --   --   --   --  19*  --   --   --   GLUCOSE 124*  119*   < > 131* 159*  --  144*  --   --   --   BUN 21  21   < > 18 19  --  15  --   --   --   CREATININE 1.20  1.23   < > 1.00 1.00  --  1.10  --   --   --   CALCIUM 9.1  --   --   --   --  7.5*  --   --   --   PROT 6.9  --   --   --   --   --   --   --   --   ALBUMIN 4.0  --   --   --   --   --   --   --   --   AST 36  --   --   --   --   --   --   --   --   ALT 29  --   --   --   --   --   --   --   --    ALKPHOS 44  --   --   --   --   --   --   --   --   BILITOT 0.8  --   --   --   --   --   --   --   --  GFRNONAA 58*  --   --   --   --  >60  --   --   --   ANIONGAP 13  --   --   --   --  8  --   --   --    < > = values in this interval not displayed.     Hematology Recent Labs  Lab 09/01/22 1525 09/01/22 1655 09/01/22 1908 09/01/22 1957 09/01/22 2106 09/01/22 2114 09/02/22 0315 09/02/22 0412 09/02/22 0512 09/02/22 0619  WBC 8.7  --   --   --  11.2*  --  6.4  --   --   --   RBC 5.03  --   --   --  3.69*  --  3.32*  --   --   --   HGB 15.0  14.7   < > 9.8*   < > 10.4*   < > 9.4* 8.5* 9.5* 9.9*  HCT 44.0  43.6   < > 29.8*   < > 32.0*   < > 28.9* 25.0* 28.0* 29.0*  MCV 86.7  --   --   --  86.7  --  87.0  --   --   --   MCH 29.2  --   --   --  28.2  --  28.3  --   --   --   MCHC 33.7  --   --   --  32.5  --  32.5  --   --   --   RDW 13.1  --   --   --  13.0  --  13.2  --   --   --   PLT 208  --  146*  --  100*  --  101*  --   --   --    < > = values in this interval not displayed.    Cardiac EnzymesNo results for input(s): "TROPONINI" in the last 168 hours. No results for input(s): "TROPIPOC" in the last 168 hours.   BNPNo results for input(s): "BNP", "PROBNP" in the last 168 hours.   DDimer No results for input(s): "DDIMER" in the last 168 hours.   Cardiac Studies   Cardiac Studies & Procedures   CARDIAC CATHETERIZATION  CARDIAC CATHETERIZATION 09/01/2022  Narrative   Mid LM to Dist LM lesion is 90% stenosed.   Ost Cx to Prox Cx lesion is 99% stenosed.   1st Diag lesion is 50% stenosed.   2nd Diag lesion is 90% stenosed.   Mid LAD lesion is 80% stenosed.   The left ventricular systolic function is normal.   LV end diastolic pressure is mildly elevated.   The left ventricular ejection fraction is 50-55% by visual estimate.   There is no aortic valve stenosis.   Recommend Aspirin post CABG.  Add Plavix when safe from a bleeding standpoint.  Acute inferior MI  due to severe ostial circumflex and distal left main stenosis.  Severe mid LAD disease and ostial second diagonal disease.  Patent RCA.  Given the anatomy, will plan for emergent bypass surgery.  IABP was placed in the right femoral artery.  The patient was also started on IV nitroglycerin.  Dr. Leafy Ro has evaluated the patient and will be taking the patient to the OR shortly.  Findings Coronary Findings Diagnostic  Dominance: Right  Left Main Mid LM to Dist LM lesion is 90% stenosed.  Left Anterior Descending Mid LAD lesion is 80% stenosed.  First  Diagonal Branch 1st Diag lesion is 50% stenosed.  Second Diagonal Branch 2nd Diag lesion is 90% stenosed.  Left Circumflex Ost Cx to Prox Cx lesion is 99% stenosed.  Right Coronary Artery Vessel is large.  Intervention  No interventions have been documented.   STRESS TESTS  MYOCARDIAL PERFUSION IMAGING 11/29/1996                   Assessment & Plan   Coronary Artery Disease; Obstructive - doing well s/p CABG - continue ASA  - continue statin, goal LDL < 55 - sending Lp(a) tomorrow - wean IABP, current 1:3 - only 35 CC contrast used 8/7 (diagnostic cath) - continue BB - amiodarone is for prevention- no evidence of AF - discussed cardiac rehab   Mild MVP with Mild MR - normal LVEF, Echo pending today    For questions or updates, please contact [facility] Please consult [email or web address] for contact info under Cardiology/STEMI.      Riley Lam, MD FASE [facility] Cardiologist [facility]  [address] [place], [address] [place], [place] [address] [phone]  8:28 AM

## 2022-09-02 NOTE — Progress Notes (Signed)
IABP removed from right femoral artery, manual pressure applied for 40 minutes. Site level 0, no S+S of hematoma. Tegaderm dressing applied, bedrest instructions given.  Bilateral dp and pt pulses present with doppler, the right dp and pt very faint.    Bedrest begins at 10:47:00

## 2022-09-02 NOTE — Progress Notes (Signed)
EVENING ROUNDS NOTE :     [address]       [place] [address]             [phone]                 1 Day Post-Op Procedure(s) (LRB): CORONARY ARTERY BYPASS GRAFTING (CABG) times three using left internal mammary artery and right saphenous vein. (N/A) TRANSESOPHAGEAL ECHOCARDIOGRAM (N/A)   Total Length of Stay:  LOS: 1 day  Events:   No events Visiting with family    BP (!) 142/77   Pulse 72   Temp 98.6 F (37 C)   Resp 13   Wt 93.5 kg   SpO2 97%   BMI 27.20 kg/m   PAP: (17-50)/(8-26) 42/20 CVP:  [4 mmHg-22 mmHg] 12 mmHg CO:  [3.3 L/min-6.7 L/min] 5.2 L/min CI:  [1.5 L/min/m2-3.16 L/min/m2] 2.44 L/min/m2  Vent Mode: CPAP;PSV FiO2 (%):  [40 %-50 %] 40 % Set Rate:  [4 bmp-16 bmp] 4 bmp Vt Set:  [640 mL] 640 mL PEEP:  [5 cmH20] 5 cmH20 Pressure Support:  [10 cmH20] 10 cmH20 Plateau Pressure:  [17 cmH20-18 cmH20] 17 cmH20   sodium chloride     sodium chloride     sodium chloride 10 mL/hr at 09/02/22 1500   lactated ringers     lactated ringers Stopped (09/02/22 0631)   nitroGLYCERIN     phenylephrine (NEO-SYNEPHRINE) Adult infusion Stopped (09/02/22 0705)    I/O last 3 completed shifts: In: 5161.9 [I.V.:3099; Blood:463; IV Piggyback:1599.9] Out: 2420 [Urine:1880; Emesis/NG output:50; Chest Tube:490]      Latest Ref Rng & Units 09/02/2022    6:19 AM 09/02/2022    5:12 AM 09/02/2022    4:12 AM  CBC  Hemoglobin 13.0 - 17.0 g/dL 9.9  9.5  8.5   Hematocrit 39.0 - 52.0 % 29.0  28.0  25.0        Latest Ref Rng & Units 09/02/2022    6:19 AM 09/02/2022    5:12 AM 09/02/2022    4:12 AM  BMP  Sodium 135 - 145 mmol/L 135  134  137   Potassium 3.5 - 5.1 mmol/L 4.3  4.3  4.0     ABG    Component Value Date/Time   PHART 7.355 09/02/2022 0619   PCO2ART 34.6 09/02/2022 0619   PO2ART 111 (H) 09/02/2022 0619   HCO3 19.4 (L) 09/02/2022 0619   TCO2 20 (L) 09/02/2022 0619   ACIDBASEDEF 6.0 (H) 09/02/2022 0619   O2SAT 98 09/02/2022 9147        Brynda Greathouse, MD 09/02/2022 3:07 PM

## 2022-09-02 NOTE — Progress Notes (Signed)
Heart wean started pt currently on a rate of 4 and 40%.

## 2022-09-02 NOTE — Anesthesia Postprocedure Evaluation (Signed)
Anesthesia Post Note  Patient: Collin [name]  Procedure(s) Performed: CORONARY ARTERY BYPASS GRAFTING (CABG) times three using left internal mammary artery and right saphenous vein. (Chest) TRANSESOPHAGEAL ECHOCARDIOGRAM     Patient location during evaluation: SICU Anesthesia Type: General Level of consciousness: awake Pain management: pain level controlled Vital Signs Assessment: post-procedure vital signs reviewed and stable Respiratory status: spontaneous breathing Cardiovascular status: requiring low dose phenylephrine infusion. Postop Assessment: no apparent nausea or vomiting Anesthetic complications: no   No notable events documented.  Last Vitals:  Vitals:   09/02/22 1045 09/02/22 1050  BP:    Pulse: 78 73  Resp: 18 15  Temp: 37 C 37 C  SpO2: 96% 95%    Last Pain:  Vitals:   09/02/22 0400  TempSrc: Core  PainSc: 5                  Collene Schlichter

## 2022-09-02 NOTE — Discharge Summary (Signed)
[address]       [place] [address]             [phone]    Physician Discharge Summary  Patient ID: Collin [name] MRN: [id number] DOB/AGE: Jun 15, 1940 82 y.o.  Admit date: 09/01/2022 Discharge date: 09/02/2022  Admission Diagnoses:  Patient Active Problem List   Diagnosis Date Noted   STEMI (ST elevation myocardial infarction) (HCC) 09/01/2022   Right shoulder pain 01/05/2022   Routine general medical examination at a health care facility 01/05/2022   Prediabetes 01/05/2022   S/P lumbar laminectomy 01/20/2018   Essential tremor 11/02/2017   Unstable gait 11/02/2017   Lumbar radiculopathy 07/19/2017   Peripheral neuropathy 12/06/2016   Hx of adenomatous colonic polyps 05/22/2014   Left inguinal hernia 09/10/2011   PSA, INCREASED 08/05/2009   CONGENITAL PIGMENTARY ANOMALY OF SKIN 07/25/2007   Dyslipidemia 07/20/2006   Coronary atherosclerosis 07/20/2006   BPH associated with nocturia 07/20/2006   Discharge Diagnoses:  Patient Active Problem List   Diagnosis Date Noted   STEMI (ST elevation myocardial infarction) (HCC) 09/01/2022   S/P CABG x 3 09/01/2022   Right shoulder pain 01/05/2022   Routine general medical examination at a health care facility 01/05/2022   Prediabetes 01/05/2022   S/P lumbar laminectomy 01/20/2018   Essential tremor 11/02/2017   Unstable gait 11/02/2017   Lumbar radiculopathy 07/19/2017   Peripheral neuropathy 12/06/2016   Hx of adenomatous colonic polyps 05/22/2014   Left inguinal hernia 09/10/2011   PSA, INCREASED 08/05/2009   CONGENITAL PIGMENTARY ANOMALY OF SKIN 07/25/2007   Dyslipidemia 07/20/2006   Coronary atherosclerosis 07/20/2006   BPH associated with nocturia 07/20/2006   Discharged Condition: {condition:18240}  Referring: Corky Crafts, MD   PCP:  [place], Betty G, MD   Chief Complaint:   STEMI     History of Present Illness:     Pt is an 82 yo very active gentleman with known CAD sp RCA  MI late 90s. Pt followed by DR Excell Seltzer for CAD and has been doing well medically managed. Pt with acute chest pain today at gym and brougth to cath lab with inferior STEMI. Pt now with severe LM disease and open right. Pt with normal LV by LV gram with LVEDP of 21. PT felt with ongoing ST seg changes with IABP to need emergent CABG.  Hospital Course: Mr. Chio was stabilized and taken to the operating room emergently.  Three-vessel coronary bypass grafting was accomplished.  The left internal mammary artery was grafted to the left anterior descending coronary artery.  Endoscopically harvested saphenous vein from the right thigh was used to graft the first diagonal and obtuse marginal coronary arteries.  Following the procedure, he separated from cardiopulmonary bypass without difficulty.  He was transferred to the surgical ICU hemodynamically stable with the intraoperative balloon pump in place by way of the right femoral artery counter pulsating at 1:1 ratio.  The patient was weaned and extubated on POD #1.  He required support with Neo-synephrine and Nitroglycerin which was weaned as hemodynamics allowed.  He was weaned from the balloon pump with removal on ***.  Consults: {consultation:18241}  Significant Diagnostic Studies: angiography:     Mid LM to Dist LM lesion is 90% stenosed.   Ost Cx to Prox Cx lesion is 99% stenosed.   1st Diag lesion is 50% stenosed.   2nd Diag lesion is 90% stenosed.   Mid LAD lesion is 80% stenosed.   The left  ventricular systolic function is normal.   LV end diastolic pressure is mildly elevated.   The left ventricular ejection fraction is 50-55% by visual estimate.   There is no aortic valve stenosis.   Recommend Aspirin post CABG.  Add Plavix when safe from a bleeding standpoint.   Acute inferior MI due to severe ostial circumflex and distal left main stenosis.  Severe mid LAD disease and ostial second diagonal disease.  Patent RCA.  Given the anatomy, will plan  for emergent bypass surgery.  IABP was placed in the right femoral artery.  The patient was also started on IV nitroglycerin.  Dr. Leafy Ro has evaluated the patient and will be taking the patient to the OR shortly.  Treatments: surgery:   09/01/2022 Patient:  Collin [name] Pre-Op Dx: STEMI with Left main stenosis and CAD   Post-op Dx:  same Procedure: CABG X 3 with the LIMA to the LAD and RSVG from the aorta to the OM and RSVG from the aorta to the Diag   Endoscopic greater saphenous vein harvest on the Right     Surgeon and Role:      Eugenio Hoes, MD- Primary    * Jillyn Hidden , PA-C - assisting An experienced assistant was required given the complexity of this surgery and the standard of surgical care. The assistant was needed for exposure, dissection, suctioning, retraction of delicate tissues and sutures, instrument exchange and for overall help during this procedure.     Discharge Exam: Blood pressure 120/69, pulse (!) 187, temperature 98.4 F (36.9 C), resp. rate 18, weight 93.5 kg, SpO2 94%. {physical [id number]   Discharge Medications:  The patient has been discharged on:   1.Beta Blocker:  Yes [  X ]                              No   [   ]                              If No, reason:  2.Ace Inhibitor/ARB: Yes [   ]                                     No  [    ]                                     If No, reason:  3.Statin:   Yes Arly.Keller   ]                  No  [   ]                  If No, reason:  4.Ecasa:  Yes  [  X ]                  No   [   ]                  If No, reason:  Patient had ACS upon admission: Yes  Plavix/P2Y12 inhibitor: Yes [  X ]  No  [   ]      Allergies as of 09/02/2022       Reactions   Penicillins Rash, Other (See Comments)   PATIENT HAS HAD A PCN REACTION WITH IMMEDIATE RASH, FACIAL/TONGUE/THROAT SWELLING, SOB, OR LIGHTHEADEDNESS WITH HYPOTENSION:  #  #  YES  #  #  Has patient had a  PCN reaction causing severe rash involving mucus membranes or skin necrosis: No Has patient had a PCN reaction that required hospitalization: No Has patient had a PCN reaction occurring within the last 10 years: No If all of the above answers are "NO", then may proceed with Cephalosporin use.     Med Rec must be completed prior to using this [facility]***        Signed:  [name], PA-C  09/02/2022, 7:27 AM

## 2022-09-03 ENCOUNTER — Inpatient Hospital Stay (HOSPITAL_COMMUNITY): Payer: PPO

## 2022-09-03 DIAGNOSIS — I2101 ST elevation (STEMI) myocardial infarction involving left main coronary artery: Secondary | ICD-10-CM | POA: Diagnosis not present

## 2022-09-03 LAB — LIPID PANEL
Cholesterol: 83 mg/dL (ref 0–200)
HDL: 43 mg/dL (ref >40)
LDL Cholesterol: 31 mg/dL (ref 0–99)
Total CHOL/HDL Ratio: 1.9 RATIO
Triglycerides: 47 mg/dL (ref <150)
VLDL: 9 mg/dL (ref 0–40)

## 2022-09-03 LAB — GLUCOSE, CAPILLARY
Glucose-Capillary: 109 mg/dL — ABNORMAL HIGH (ref 70–99)
Glucose-Capillary: 118 mg/dL — ABNORMAL HIGH (ref 70–99)
Glucose-Capillary: 121 mg/dL — ABNORMAL HIGH (ref 70–99)
Glucose-Capillary: 122 mg/dL — ABNORMAL HIGH (ref 70–99)
Glucose-Capillary: 124 mg/dL — ABNORMAL HIGH (ref 70–99)
Glucose-Capillary: 128 mg/dL — ABNORMAL HIGH (ref 70–99)
Glucose-Capillary: 144 mg/dL — ABNORMAL HIGH (ref 70–99)

## 2022-09-03 MED ORDER — INSULIN ASPART 100 UNIT/ML IJ SOLN
0.0000 [IU] | Freq: Three times a day (TID) | INTRAMUSCULAR | Status: DC
  Administered 2022-09-03 – 2022-09-05 (×4): 2 [IU] via SUBCUTANEOUS

## 2022-09-03 MED ORDER — ASPIRIN 81 MG PO TBEC
81.0000 mg | DELAYED_RELEASE_TABLET | Freq: Every day | ORAL | Status: DC
  Administered 2022-09-04 – 2022-09-09 (×6): 81 mg via ORAL
  Filled 2022-09-03 (×6): qty 1

## 2022-09-03 MED ORDER — MELATONIN 3 MG PO TABS
3.0000 mg | ORAL_TABLET | Freq: Every day | ORAL | Status: DC
  Administered 2022-09-03 – 2022-09-08 (×6): 3 mg via ORAL
  Filled 2022-09-03 (×6): qty 1

## 2022-09-03 MED ORDER — TRAZODONE HCL 50 MG PO TABS
25.0000 mg | ORAL_TABLET | Freq: Every day | ORAL | Status: DC
  Administered 2022-09-03 – 2022-09-08 (×6): 25 mg via ORAL
  Filled 2022-09-03 (×6): qty 1

## 2022-09-03 MED ORDER — CLOPIDOGREL BISULFATE 75 MG PO TABS
75.0000 mg | ORAL_TABLET | Freq: Every day | ORAL | Status: DC
  Administered 2022-09-03 – 2022-09-09 (×7): 75 mg via ORAL
  Filled 2022-09-03 (×7): qty 1

## 2022-09-03 MED ORDER — ASPIRIN 81 MG PO CHEW: 81.0000 mg | CHEWABLE_TABLET | Freq: Every day | ORAL | Status: DC

## 2022-09-03 MED ORDER — FUROSEMIDE 10 MG/ML IJ SOLN
40.0000 mg | Freq: Once | INTRAMUSCULAR | Status: AC
  Administered 2022-09-03: 40 mg via INTRAVENOUS
  Filled 2022-09-03: qty 4

## 2022-09-03 MED FILL — Potassium Chloride Inj 2 mEq/ML: INTRAVENOUS | Qty: 40 | Status: AC

## 2022-09-03 MED FILL — Lidocaine HCl Local Preservative Free (PF) Inj 2%: INTRAMUSCULAR | Qty: 14 | Status: AC

## 2022-09-03 MED FILL — Heparin Sodium (Porcine) Inj 1000 Unit/ML: Qty: 1000 | Status: AC

## 2022-09-03 NOTE — Evaluation (Signed)
Physical Therapy Evaluation Patient Details Name: Collin [name] MRN: [id number] DOB: [birth year] Today's Date: 09/03/2022  History of Present Illness  Pt is 82 year old presented to [facility] on  09/01/22 for chest tightness. Pt with STEMI and underwent emergent CABG x 3 on 8/27. PMH - CAD, MI, HTN, lumbar disc disease, microdiskectomy  Clinical Impression  Pt admitted with above diagnosis and presents to PT with functional limitations due to deficits listed below (See PT problem list). Pt needs skilled PT to maximize independence and safety. Pt very active prior to admission and anticipate he will make good progress for return home with spouse.           If plan is discharge home, recommend the following: A little help with walking and/or transfers;A little help with bathing/dressing/bathroom;Assistance with cooking/housework;Assist for transportation;Help with stairs or ramp for entrance   Can travel by private vehicle        Equipment Recommendations Rollator (4 wheels)  Recommendations for Other Services       Functional Status Assessment Patient has had a recent decline in their functional status and demonstrates the ability to make significant improvements in function in a reasonable and predictable amount of time.     Precautions / Restrictions Precautions Precautions: Fall;Sternal Required Braces or Orthoses: Other Brace Other Brace: Has AFO at home Restrictions Weight Bearing Restrictions: Yes (sternal precautions) RLE Weight Bearing: Non weight bearing LLE Weight Bearing: Non weight bearing      Mobility  Bed Mobility Overal bed mobility: Needs Assistance Bed Mobility: Rolling, Sidelying to Sit Rolling: Min assist Sidelying to sit: Min assist       General bed mobility comments: Assist to elevate trunk and bring hips to EOb    Transfers Overall transfer level: Needs assistance Equipment used: Rolling walker (2 wheels) Marketing executive walker) Transfers: Sit to/from  Stand Sit to Stand: Mod assist           General transfer comment: Assist to power up and stabilize    Ambulation/Gait Ambulation/Gait assistance: Min assist Gait Distance (Feet): 125 Feet Assistive device: Fara Boros Gait Pattern/deviations: Step-through pattern, Decreased step length - right, Decreased step length - left, Decreased stride length, Trunk flexed Gait velocity: decr Gait velocity interpretation: <1.31 ft/sec, indicative of household ambulator   General Gait Details: Assist for balance and support. Verbal cues to stand more erect  Stairs            Wheelchair Mobility     Tilt Bed    Modified Rankin (Stroke Patients Only)       Balance Overall balance assessment: Needs assistance Sitting-balance support: No upper extremity supported, Feet supported Sitting balance-Leahy Scale: Good     Standing balance support: Single extremity supported Standing balance-Leahy Scale: Poor Standing balance comment: UE support                             Pertinent Vitals/Pain Pain Assessment Pain Assessment: Faces Faces Pain Scale: Hurts a little bit Pain Descriptors / Indicators: Grimacing, Guarding Pain Intervention(s): Limited activity within patient's tolerance    Home Living Family/patient expects to be discharged to:: Private residence Living Arrangements: Spouse/significant other Available Help at Discharge: Family;Available 24 hours/day Type of Home: House Home Access: Stairs to enter Entrance Stairs-Rails: Right Entrance Stairs-Number of Steps: 2-3   Home Layout: Two level;Able to live on main level with bedroom/bathroom Home Equipment: None      Prior Function Prior Level of  Function : Independent/Modified Independent             Mobility Comments: Active. Goes to gym 3x/wk. Walks several miles several times a week.       Extremity/Trunk Assessment   Upper Extremity Assessment Upper Extremity Assessment: Defer to OT  evaluation    Lower Extremity Assessment Lower Extremity Assessment: Generalized weakness;LLE deficits/detail LLE Deficits / Details: Lt foot drop       Communication   Communication Communication: No apparent difficulties  Cognition Arousal: Alert Behavior During Therapy: WFL for tasks assessed/performed Overall Cognitive Status: Within Functional Limits for tasks assessed                                          General Comments General comments (skin integrity, edema, etc.): VSS on RA    Exercises     Assessment/Plan    PT Assessment Patient needs continued PT services  PT Problem List Decreased strength;Decreased balance;Decreased mobility;Decreased knowledge of precautions;Decreased knowledge of use of DME       PT Treatment Interventions DME instruction;Gait training;Functional mobility training;Therapeutic activities;Therapeutic exercise;Balance training;Patient/family education;Stair training    PT Goals (Current goals can be found in the Care Plan section)  Acute Rehab PT Goals Patient Stated Goal: return to independence PT Goal Formulation: With patient Time For Goal Achievement: 09/17/22 Potential to Achieve Goals: Good    Frequency Min 1X/week     Co-evaluation               AM-PAC PT "6 Clicks" Mobility  Outcome Measure Help needed turning from your back to your side while in a flat bed without using bedrails?: A Little Help needed moving from lying on your back to sitting on the side of a flat bed without using bedrails?: A Little Help needed moving to and from a bed to a chair (including a wheelchair)?: A Little Help needed standing up from a chair using your arms (e.g., wheelchair or bedside chair)?: A Little Help needed to walk in hospital room?: A Little Help needed climbing 3-5 steps with a railing? : A Lot 6 Click Score: 17    End of Session Equipment Utilized During Treatment: Gait belt Activity Tolerance: Patient  tolerated treatment well Patient left: in chair;with call bell/phone within reach;with family/visitor present Nurse Communication: Mobility status PT Visit Diagnosis: Unsteadiness on feet (R26.81);Other abnormalities of gait and mobility (R26.89);Muscle weakness (generalized) (M62.81)    Time: 0981-1914 PT Time Calculation (min) (ACUTE ONLY): 33 min   Charges:   PT Evaluation $PT Eval Moderate Complexity: 1 Mod PT Treatments $Gait Training: 8-22 mins PT General Charges $$ ACUTE PT VISIT: 1 Visit         [facility] PT Acute Rehabilitation Services Office [phone]   Angelina Ok [facility] 09/03/2022, 2:21 PM

## 2022-09-03 NOTE — Progress Notes (Addendum)
      [address]       [name] [address]             [phone]      2 Days Post-Op Procedure(s) (LRB): CORONARY ARTERY BYPASS GRAFTING (CABG) times three using left internal mammary artery and right saphenous vein. (N/A) TRANSESOPHAGEAL ECHOCARDIOGRAM (N/A)  Subjective:  Sitting up in chairs.  Looks good.  Pain is controlled.. No N/V.  Not passing gas yet, however hasn't eaten much.. Didn't sleep well  Objective: Vital signs in last 24 hours: Temp:  [97.8 F (36.6 C)-98.8 F (37.1 C)] 97.9 F (36.6 C) (08/09 0400) Pulse Rate:  [68-88] 68 (08/09 0600) Cardiac Rhythm: Normal sinus rhythm (08/08 2000) Resp:  [11-25] 11 (08/09 0600) BP: (109-162)/(58-101) 110/67 (08/09 0600) SpO2:  [89 %-100 %] 100 % (08/09 0600) Arterial Line BP: (121-203)/(55-75) 188/75 (08/08 1159) Weight:  [90.7 kg] 90.7 kg (08/09 0556)  Hemodynamic parameters for last 24 hours: PAP: (26-50)/(14-26) 42/20 CVP:  [9 mmHg-22 mmHg] 12 mmHg CO:  [4.7 L/min-6.7 L/min] 5.2 L/min CI:  [2.2 L/min/m2-3.16 L/min/m2] 2.44 L/min/m2  Intake/Output from previous day: 08/08 0701 - 08/09 0700 In: 468.8 [P.O.:120; I.V.:198.8; IV Piggyback:150] Out: 4305 [Urine:3855; Chest Tube:450]  General appearance: alert, cooperative, and no distress Heart: regular rate and rhythm Lungs: clear to auscultation bilaterally Abdomen: soft, non-tender; bowel sounds normal; no masses,  no organomegaly Extremities: edema trace Wound: clean and dry  Lab Results: Recent Labs    09/02/22 1727 09/03/22 0430  WBC 9.1 8.7  HGB 10.7* 9.9*  HCT 32.8* 30.1*  PLT 121* 111*   BMET:  Recent Labs    09/02/22 1727 09/03/22 0430  NA 134* 134*  K 3.9 4.0  CL 100 97*  CO2 24 26  GLUCOSE 139* 122*  BUN 13 14  CREATININE 1.27* 1.28*  CALCIUM 8.2* 8.4*    PT/INR:  Recent Labs    09/01/22 2106  LABPROT 18.1*  INR 1.5*   ABG    Component Value Date/Time   PHART 7.355 09/02/2022 0619   HCO3 19.4 (L)  09/02/2022 0619   TCO2 20 (L) 09/02/2022 0619   ACIDBASEDEF 6.0 (H) 09/02/2022 0619   O2SAT 98 09/02/2022 0619   CBG (last 3)  Recent Labs    09/03/22 0017 09/03/22 0426 09/03/22 0702  GLUCAP 122* 121* 144*    Assessment/Plan: S/P Procedure(s) (LRB): CORONARY ARTERY BYPASS GRAFTING (CABG) times three using left internal mammary artery and right saphenous vein. (N/A) TRANSESOPHAGEAL ECHOCARDIOGRAM (N/A)  CV- IABP out, NSR, BP is labile ranging from 110-140- continue Lopressor, Amiodarone.. will d/c EPW today, start Plavix this evening.. will monitor blood pressure can likely start ACE/ARB prior to d/c as BP allows Pulm- on 2L oxygen, wean as tolerated, CT output remains low, no air leak present, will d/c chest tubes today.. continue IS Renal- creatinine up to 1.26, combination of diuretics and dye load from cath.. remains volume overloaded will repeat Lasix, potassium today.. foley is out Expected post operative blood loss anemia, Hgb at 9.9 remains stable Post operative thrombocytopenia, up to 100 to 111 today CBGs controlled, will transition to TID/HS coverage DIspo- patient doing well, d/c chest tubes/wires, start Plavix.  Will try Melatonin for sleep, continue diuretics.Marland Kitchen possibly transfer to 4E later today   LOS: 2 days   Collin Dandy, PA-C 09/03/2022  Agree with above Needs mobilization  Follow confusion for now May be able to transfer to floor later today

## 2022-09-03 NOTE — Discharge Instructions (Signed)
Discharge Instructions:  1. You may shower, please wash incisions daily with soap and water and keep dry.  If you wish to cover wounds with dressing you may do so but please keep clean and change daily.  No tub baths or swimming until incisions have completely healed.  If your incisions become red or develop any drainage please call our office at [phone]  2. No Driving until cleared by Dr. Weldner's office and you are no longer using narcotic pain medications  3. Monitor your weight daily.. Please use the same scale and weigh at same time... If you gain 5-10 lbs in 48 hours with associated lower extremity swelling, please contact our office at [phone]  4. Fever of 101.5 for at least 24 hours with no source, please contact our office at [phone]  5. Activity- up as tolerated, please walk at least 3 times per day.  Avoid strenuous activity, no lifting, pushing, or pulling with your arms over 8-10 lbs for a minimum of 6 weeks  6. If any questions or concerns arise, please do not hesitate to contact our office at [phone]  

## 2022-09-03 NOTE — TOC Initial Note (Signed)
Transition of Care [facility]) - Initial/Assessment Note    Patient Details  Name: Collin [name] MRN: [id number] Date of Birth: 15-Dec-1940  Transition of Care [facility] CM/SW Contact:    Elliot Cousin, RN Phone Number: [phone] 09/03/2022, 4:03 PM  Clinical Narrative:  CM spoke to pt and dtrs at bedside. Offered choice for [facility] (medicare choice list placed on chart). Pt states he was independent at home. Does not currently have any DME. Will continue to follow for dc needs.                  Expected Discharge Plan: Home w Home Health Services Barriers to Discharge: Continued Medical Work up   Patient Goals and CMS Choice Patient states their goals for this hospitalization and ongoing recovery are:: wants to get back to his routine CMS Medicare.gov Compare Post Acute Care list provided to:: Patient Choice offered to / list presented to : Patient      Expected Discharge Plan and Services   Discharge Planning Services: CM Consult Post Acute Care Choice: Home Health Living arrangements for the past 2 months: Single Family Home                                      Prior Living Arrangements/Services Living arrangements for the past 2 months: Single Family Home Lives with:: Spouse Patient language and need for interpreter reviewed:: Yes Do you feel safe going back to the place where you live?: Yes      Need for Family Participation in Patient Care: No (Comment) Care giver support system in place?: Yes (comment)   Criminal Activity/Legal Involvement Pertinent to Current Situation/Hospitalization: No - Comment as needed  Activities of Daily Living      Permission Sought/Granted Permission sought to share information with : Case Manager, Family Supports, PCP Permission granted to share information with : Yes, Verbal Permission Granted  Share Information with NAME: Olegario Messier [place]  Permission granted to share info w AGENCY: Home Health  Permission granted to share info  w Relationship: wife  Permission granted to share info w Contact Information: [phone]  Emotional Assessment Appearance:: Appears stated age Attitude/Demeanor/Rapport: Engaged Affect (typically observed): Accepting Orientation: : Oriented to Self, Oriented to Place, Oriented to  Time, Oriented to Situation   Psych Involvement: No (comment)  Admission diagnosis:  STEMI (ST elevation myocardial infarction) (HCC) [I21.3] S/P CABG x 3 [Z95.1] Patient Active Problem List   Diagnosis Date Noted   STEMI (ST elevation myocardial infarction) (HCC) 09/01/2022   S/P CABG x 3 09/01/2022   Right shoulder pain 01/05/2022   Routine general medical examination at a health care facility 01/05/2022   Prediabetes 01/05/2022   S/P lumbar laminectomy 01/20/2018   Essential tremor 11/02/2017   Unstable gait 11/02/2017   Lumbar radiculopathy 07/19/2017   Peripheral neuropathy 12/06/2016   Hx of adenomatous colonic polyps 05/22/2014   Left inguinal hernia 09/10/2011   PSA, INCREASED 08/05/2009   CONGENITAL PIGMENTARY ANOMALY OF SKIN 07/25/2007   Dyslipidemia 07/20/2006   Coronary atherosclerosis 07/20/2006   BPH associated with nocturia 07/20/2006   PCP:  [place], Betty G, MD Pharmacy:   [facility] [phone] [place], [place] - [address] AT [facility] OF [address] & [facility] [address] [place] [address] Phone: [phone] Fax: [phone]     Social Determinants of Health (SDOH) Social History: SDOH Screenings   Food  Insecurity: No Food Insecurity (04/20/2022)  Housing: Low Risk  (04/20/2022)  Transportation Needs: No Transportation Needs (03/03/2021)  Alcohol Screen: Low Risk  (01/03/2020)  Depression (PHQ2-9): Low Risk  (03/04/2022)  Financial Resource Strain: Low Risk  (10/16/2021)  Physical Activity: Sufficiently Active (03/03/2021)  Social Connections: Moderately Integrated (01/03/2020)  Stress: No Stress Concern Present (03/03/2021)  Tobacco Use: Low Risk   (06/08/2022)   SDOH Interventions:     Readmission Risk Interventions     No data to display

## 2022-09-03 NOTE — Progress Notes (Signed)
NAME:  Collin [name], MRN:  [id number], DOB:  Jun 12, 1940, LOS: 2 ADMISSION DATE:  09/01/2022, CONSULTATION DATE: 09/02/2022 REFERRING MD: Dr. Leafy Ro, CHIEF COMPLAINT: Acute NSTEMI s/p CABG  History of Present Illness:  82 year old male with coronary artery disease, hypertension and hyperlipidemia who developed chest pain while doing push-ups at gym, brought into the emergency department, EKG showed inferior STEMI, underwent cardiac catheter which showed severe left main disease and open RCA, IABP was placed, CT surgery was consulted for evaluation of CABG.  Patient underwent emergent CABG x 3.  Remained intubated and was transferred to ICU.  PCCM was consulted for help evaluation medical management  Pertinent  Medical History   Past Medical History:  Diagnosis Date   Allergy    Arthritis    BPH (benign prostatic hyperplasia)    CAD (coronary artery disease)    Cataract    Diverticulosis of colon    Hx of adenomatous colonic polyps 05/22/2014   Hyperlipidemia    Hypertension    MYOCARDIAL INFARCTION, HX OF 07/20/2006   Qualifier: Diagnosis of  By: Cato Mulligan MD, [facility] Events: Including procedures, antibiotic start and stop dates in addition to other pertinent events     Interim History / Subjective:  No overnight issues Stated pain is well-controlled Denies nausea and vomiting Stated could not sleep overnight  Objective   Blood pressure 98/62, pulse 79, temperature 97.9 F (36.6 C), temperature source Oral, resp. rate 19, weight 90.7 kg, SpO2 95%. PAP: (26-50)/(14-26) 42/20 CVP:  [10 mmHg-22 mmHg] 12 mmHg CO:  [5.2 L/min-6.7 L/min] 5.2 L/min CI:  [2.44 L/min/m2-3.16 L/min/m2] 2.44 L/min/m2      Intake/Output Summary (Last 24 hours) at 09/03/2022 0802 Last data filed at 09/03/2022 0600 Gross per 24 hour  Intake 458.17 ml  Output 4165 ml  Net -3706.83 ml   Filed Weights   09/01/22 1600 09/02/22 0656 09/03/22 0556  Weight: 89 kg 93.5 kg 90.7 kg     Examination: General: Elderly male, lying on the bed HEENT: [place]/AT, eyes anicteric.  moist mucus membranes Neuro: Alert, awake following commands Chest: Central sternotomy surgical incision looks clean and dry coarse breath sounds, no wheezes or rhonchi.  Mediastinal and chest tube in place Heart: Regular rate and rhythm, no murmurs or gallops Abdomen: Soft, nontender, nondistended, bowel sounds present Skin: No rash  Labs and images were reviewed  Resolved Hospital Problem list   Acute respiratory insufficiency, postop  Assessment & Plan:  Coronary artery disease, acute STEMI s/p emergent CABG x 3 Cardiogenic shock status post IABP Continue aspirin and statin Continue amiodaron  Chest tube management TCTS Chest tube output was 450 cc in last 24 hours, it will be discontinued per TCTS Continue pain control with tramadol, oxycodone and morphine  IABP was weaned off yesterday He will need to be started on Plavix later today Encourage incentive spirometry Continue gentle diuresis  Hypertension Continue metoprolol  Hyperlipidemia Continue atorvastatin  Prediabetes  Patient hemoglobin A1c is 6.0 Continue sliding scale insulin with CBG goal 140-180  Expected perioperative blood loss anemia Thrombocytopenia due to CPB Monitor H/H and PLT counts  Hypervolemic hyponatremia Continue diuresis, monitor electrolytes   Best Practice (right click and "Reselect all SmartList Selections" daily)   Diet/type: Regular consistent DVT prophylaxis: Enoxaparin GI prophylaxis: PPI Lines: Discontinue today Foley: Discontinued today Code Status:  full code Last date of multidisciplinary goals of care discussion [Per primary team]   Labs   CBC: Recent Labs  Lab 09/01/22 1525 09/01/22 1655 09/01/22 1908 09/01/22 1957 09/01/22 2106 09/01/22 2114 09/02/22 0315 09/02/22 0412 09/02/22 0512 09/02/22 0619 09/02/22 1727 09/03/22 0430  WBC 8.7  --   --   --  11.2*  --  6.4  --    --   --  9.1 8.7  NEUTROABS 6.0  --   --   --   --   --   --   --   --   --   --   --   HGB 15.0  14.7   < > 9.8*   < > 10.4*   < > 9.4* 8.5* 9.5* 9.9* 10.7* 9.9*  HCT 44.0  43.6   < > 29.8*   < > 32.0*   < > 28.9* 25.0* 28.0* 29.0* 32.8* 30.1*  MCV 86.7  --   --   --  86.7  --  87.0  --   --   --  88.9 87.2  PLT 208  --  146*  --  100*  --  101*  --   --   --  121* 111*   < > = values in this interval not displayed.    Basic Metabolic Panel: Recent Labs  Lab 09/01/22 1525 09/01/22 1655 09/01/22 1856 09/01/22 1957 09/01/22 2114 09/02/22 0315 09/02/22 0412 09/02/22 0512 09/02/22 0619 09/02/22 1727 09/03/22 0430  NA 135  133*   < > 132* 132*   < > 135 137 134* 135 134* 134*  K 4.2  4.1   < > 5.2* 5.0   < > 4.2 4.0 4.3 4.3 3.9 4.0  CL 101  100   < > 100 100  --  108  --   --   --  100 97*  CO2 20*  --   --   --   --  19*  --   --   --  24 26  GLUCOSE 124*  119*   < > 131* 159*  --  144*  --   --   --  139* 122*  BUN 21  21   < > 18 19  --  15  --   --   --  13 14  CREATININE 1.20  1.23   < > 1.00 1.00  --  1.10  --   --   --  1.27* 1.28*  CALCIUM 9.1  --   --   --   --  7.5*  --   --   --  8.2* 8.4*  MG  --   --   --   --   --  2.8*  --   --   --  2.2  --    < > = values in this interval not displayed.   GFR: Estimated Creatinine Clearance: 48.6 mL/min (A) (by C-G formula based on SCr of 1.28 mg/dL (H)). Recent Labs  Lab 09/01/22 1525 09/01/22 2106 09/02/22 0315 09/02/22 1727 09/03/22 0430  WBC 8.7 11.2* 6.4 9.1 8.7  LATICACIDVEN 0.7  --   --   --   --     Liver Function Tests: Recent Labs  Lab 09/01/22 1525  AST 36  ALT 29  ALKPHOS 44  BILITOT 0.8  PROT 6.9  ALBUMIN 4.0   No results for input(s): "LIPASE", "AMYLASE" in the last 168 hours. No results for input(s): "AMMONIA" in the last 168 hours.  ABG    Component Value Date/Time   PHART 7.355 09/02/2022  1610   PCO2ART 34.6 09/02/2022 0619   PO2ART 111 (H) 09/02/2022 0619   HCO3 19.4 (L)  09/02/2022 0619   TCO2 20 (L) 09/02/2022 0619   ACIDBASEDEF 6.0 (H) 09/02/2022 0619   O2SAT 98 09/02/2022 0619     Coagulation Profile: Recent Labs  Lab 09/01/22 1525 09/01/22 2106  INR 1.1 1.5*    Cardiac Enzymes: No results for input(s): "CKTOTAL", "CKMB", "CKMBINDEX", "TROPONINI" in the last 168 hours.  HbA1C: HbA1c, POC (prediabetic range)  Date/Time Value Ref Range Status  01/05/2022 04:04 PM 5.7 5.7 - 6.4 % Final   Hgb A1c MFr Bld  Date/Time Value Ref Range Status  09/01/2022 03:25 PM 6.0 (H) 4.8 - 5.6 % Final    Comment:    (NOTE) Pre diabetes:          5.7%-6.4%  Diabetes:              >6.4%  Glycemic control for   <7.0% adults with diabetes   07/15/2021 02:16 PM 6.3 4.6 - 6.5 % Final    Comment:    Glycemic Control Guidelines for People with Diabetes:Non Diabetic:  <6%Goal of Therapy: <7%Additional Action Suggested:  >8%     CBG: Recent Labs  Lab 09/02/22 2120 09/03/22 0017 09/03/22 0426 09/03/22 0702 09/03/22 0724  GLUCAP 125* 122* 121* 144* 128*       Cheri Fowler, MD [facility] See Amion for pager If no response to pager, please call [phone] until 7pm After 7pm, Please call E-link [phone]    prog

## 2022-09-03 NOTE — Progress Notes (Signed)
Pacing wires removed per MD order wires intact. Dry dressing applied. Pt tolerated well.

## 2022-09-04 ENCOUNTER — Inpatient Hospital Stay (HOSPITAL_COMMUNITY): Payer: PPO

## 2022-09-04 DIAGNOSIS — Z951 Presence of aortocoronary bypass graft: Secondary | ICD-10-CM

## 2022-09-04 LAB — GLUCOSE, CAPILLARY
Glucose-Capillary: 115 mg/dL — ABNORMAL HIGH (ref 70–99)
Glucose-Capillary: 157 mg/dL — ABNORMAL HIGH (ref 70–99)
Glucose-Capillary: 119 mg/dL — ABNORMAL HIGH (ref 70–99)
Glucose-Capillary: 123 mg/dL — ABNORMAL HIGH (ref 70–99)

## 2022-09-04 MED ORDER — AMIODARONE HCL IN DEXTROSE 360-4.14 MG/200ML-% IV SOLN
INTRAVENOUS | Status: AC
  Administered 2022-09-04: 150 mg via INTRAVENOUS
  Filled 2022-09-04: qty 200

## 2022-09-04 MED ORDER — AMIODARONE LOAD VIA INFUSION: 150.0000 mg | Freq: Once | INTRAVENOUS | Status: AC

## 2022-09-04 MED ORDER — AMIODARONE HCL IN DEXTROSE 360-4.14 MG/200ML-% IV SOLN
60.0000 mg/h | INTRAVENOUS | Status: AC
  Administered 2022-09-04 (×2): 60 mg/h via INTRAVENOUS
  Filled 2022-09-04: qty 200

## 2022-09-04 MED ORDER — AMIODARONE HCL 200 MG PO TABS
400.0000 mg | ORAL_TABLET | Freq: Two times a day (BID) | ORAL | Status: DC
  Administered 2022-09-04 – 2022-09-05 (×2): 400 mg via ORAL
  Filled 2022-09-04 (×2): qty 2

## 2022-09-04 MED ORDER — SENNOSIDES-DOCUSATE SODIUM 8.6-50 MG PO TABS
1.0000 | ORAL_TABLET | Freq: Two times a day (BID) | ORAL | Status: DC
  Administered 2022-09-04 – 2022-09-05 (×4): 1 via ORAL
  Filled 2022-09-04 (×4): qty 1

## 2022-09-04 MED ORDER — AMIODARONE HCL IN DEXTROSE 360-4.14 MG/200ML-% IV SOLN
30.0000 mg/h | INTRAVENOUS | Status: DC
  Administered 2022-09-05: 30 mg/h via INTRAVENOUS
  Filled 2022-09-04: qty 200

## 2022-09-04 NOTE — Progress Notes (Signed)
NAME:  Collin [name], MRN:  [id number], DOB:  14-Apr-1940, LOS: 3 ADMISSION DATE:  09/01/2022, CONSULTATION DATE: 09/02/2022 REFERRING MD: Dr. Leafy Ro, CHIEF COMPLAINT: Acute NSTEMI s/p CABG  History of Present Illness:  82 year old male with coronary artery disease, hypertension and hyperlipidemia who developed chest pain while doing push-ups at gym, brought into the emergency department, EKG showed inferior STEMI, underwent cardiac catheter which showed severe left main disease and open RCA, IABP was placed, CT surgery was consulted for evaluation of CABG.  Patient underwent emergent CABG x 3.  Remained intubated and was transferred to ICU.  PCCM was consulted for help evaluation medical management  Pertinent  Medical History   Past Medical History:  Diagnosis Date   Allergy    Arthritis    BPH (benign prostatic hyperplasia)    CAD (coronary artery disease)    Cataract    Diverticulosis of colon    Hx of adenomatous colonic polyps 05/22/2014   Hyperlipidemia    Hypertension    MYOCARDIAL INFARCTION, HX OF 07/20/2006   Qualifier: Diagnosis of  By: Cato Mulligan MD, [facility] Events: Including procedures, antibiotic start and stop dates in addition to other pertinent events     Interim History / Subjective:  No overnight issues Did not have bowel movement yet Denies nausea vomiting, pain is controlled Walked in the hallway Did get some sleep overnight  Objective   Blood pressure 110/63, pulse 63, temperature 97.9 F (36.6 C), temperature source Oral, resp. rate 16, weight 81.7 kg, SpO2 95%.        Intake/Output Summary (Last 24 hours) at 09/04/2022 0757 Last data filed at 09/04/2022 0300 Gross per 24 hour  Intake 240 ml  Output 1460 ml  Net -1220 ml   Filed Weights   09/02/22 0656 09/03/22 0556 09/04/22 0400  Weight: 93.5 kg 90.7 kg 81.7 kg    Examination: General: Elderly male, lying on the bed HEENT: [place]/AT, eyes anicteric.  moist mucus  membranes Neuro: Alert, awake following commands Chest: Central sternotomy incision looks clean and dry, used air entry at the bases bilaterally, no wheezes or rhonchi Heart: Regular rate and rhythm, no murmurs or gallops Abdomen: Soft, nontender, nondistended, bowel sounds present Skin: No rash   Labs and images were reviewed  Resolved Hospital Problem list   Acute respiratory insufficiency, postop Cardiogenic shock status post IABP  Assessment & Plan:  Coronary artery disease, acute STEMI s/p emergent CABG x 3 Continue aspirin, Plavix and statin Continue amiodaron  Chest and mediastinal tubes were discontinued yesterday Continue pain control with tramadol, oxycodone and morphine Encourage incentive spirometry Holding diuresis  Hypertension Continue metoprolol  Hyperlipidemia Continue atorvastatin  Prediabetes  Patient hemoglobin A1c is 6.0 Continue sliding scale insulin with CBG goal 140-180  Expected perioperative blood loss anemia Thrombocytopenia due to CPB Monitor H/H and PLT counts Platelet counts are low but is stable  CKD stage IIIa Hypervolemic hyponatremia Holding diuresis today Serum creatinine is slightly elevated from baseline but does not meet criteria for AKI Avoid nephrotoxic agent Monitor electrolytes  BPH Continue tamsulosin   Best Practice (right click and "Reselect all SmartList Selections" daily)   Diet/type: Regular consistent DVT prophylaxis: Enoxaparin GI prophylaxis: PPI Lines: N/A Foley: N/A Code Status:  full code Last date of multidisciplinary goals of care discussion [Per primary team]   Labs   CBC: Recent Labs  Lab 09/01/22 1525 09/01/22 1655 09/01/22 2106 09/01/22 2114 09/02/22 0315 09/02/22 3086 09/02/22 5784  09/02/22 0619 09/02/22 1727 09/03/22 0430 09/04/22 0158  WBC 8.7  --  11.2*  --  6.4  --   --   --  9.1 8.7 9.4  NEUTROABS 6.0  --   --   --   --   --   --   --   --   --   --   HGB 15.0  14.7   < >  10.4*   < > 9.4*   < > 9.5* 9.9* 10.7* 9.9* 10.0*  HCT 44.0  43.6   < > 32.0*   < > 28.9*   < > 28.0* 29.0* 32.8* 30.1* 30.3*  MCV 86.7  --  86.7  --  87.0  --   --   --  88.9 87.2 88.9  PLT 208   < > 100*  --  101*  --   --   --  121* 111* 109*   < > = values in this interval not displayed.    Basic Metabolic Panel: Recent Labs  Lab 09/01/22 1525 09/01/22 1655 09/01/22 1957 09/01/22 2114 09/02/22 0315 09/02/22 0412 09/02/22 0512 09/02/22 0619 09/02/22 1727 09/03/22 0430 09/04/22 0158  NA 135  133*   < > 132*   < > 135   < > 134* 135 134* 134* 130*  K 4.2  4.1   < > 5.0   < > 4.2   < > 4.3 4.3 3.9 4.0 3.9  CL 101  100   < > 100  --  108  --   --   --  100 97* 97*  CO2 20*  --   --   --  19*  --   --   --  24 26 22   GLUCOSE 124*  119*   < > 159*  --  144*  --   --   --  139* 122* 125*  BUN 21  21   < > 19  --  15  --   --   --  13 14 20   CREATININE 1.20  1.23   < > 1.00  --  1.10  --   --   --  1.27* 1.28* 1.35*  CALCIUM 9.1  --   --   --  7.5*  --   --   --  8.2* 8.4* 8.4*  MG  --   --   --   --  2.8*  --   --   --  2.2  --   --    < > = values in this interval not displayed.   GFR: Estimated Creatinine Clearance: 46 mL/min (A) (by C-G formula based on SCr of 1.35 mg/dL (H)). Recent Labs  Lab 09/01/22 1525 09/01/22 2106 09/02/22 0315 09/02/22 1727 09/03/22 0430 09/04/22 0158  WBC 8.7   < > 6.4 9.1 8.7 9.4  LATICACIDVEN 0.7  --   --   --   --   --    < > = values in this interval not displayed.    Liver Function Tests: Recent Labs  Lab 09/01/22 1525  AST 36  ALT 29  ALKPHOS 44  BILITOT 0.8  PROT 6.9  ALBUMIN 4.0   No results for input(s): "LIPASE", "AMYLASE" in the last 168 hours. No results for input(s): "AMMONIA" in the last 168 hours.  ABG    Component Value Date/Time   PHART 7.355 09/02/2022 0619   PCO2ART 34.6 09/02/2022 0619   PO2ART 111 (H)  09/02/2022 0619   HCO3 19.4 (L) 09/02/2022 0619   TCO2 20 (L) 09/02/2022 0619   ACIDBASEDEF 6.0  (H) 09/02/2022 0619   O2SAT 98 09/02/2022 0619     Coagulation Profile: Recent Labs  Lab 09/01/22 1525 09/01/22 2106  INR 1.1 1.5*    Cardiac Enzymes: No results for input(s): "CKTOTAL", "CKMB", "CKMBINDEX", "TROPONINI" in the last 168 hours.  HbA1C: HbA1c, POC (prediabetic range)  Date/Time Value Ref Range Status  01/05/2022 04:04 PM 5.7 5.7 - 6.4 % Final   Hgb A1c MFr Bld  Date/Time Value Ref Range Status  09/01/2022 03:25 PM 6.0 (H) 4.8 - 5.6 % Final    Comment:    (NOTE) Pre diabetes:          5.7%-6.4%  Diabetes:              >6.4%  Glycemic control for   <7.0% adults with diabetes   07/15/2021 02:16 PM 6.3 4.6 - 6.5 % Final    Comment:    Glycemic Control Guidelines for People with Diabetes:Non Diabetic:  <6%Goal of Therapy: <7%Additional Action Suggested:  >8%     CBG: Recent Labs  Lab 09/03/22 0724 09/03/22 1109 09/03/22 1613 09/03/22 2127 09/04/22 0651  GLUCAP 128* 109* 124* 118* 115*       Cheri Fowler, MD [facility] See Amion for pager If no response to pager, please call [phone] until 7pm After 7pm, Please call E-link [phone]

## 2022-09-04 NOTE — Evaluation (Addendum)
Occupational Therapy Evaluation Patient Details Name: Collin [name] MRN: [id number] DOB: 01/21/1941 Today's Date: 09/04/2022   History of Present Illness Pt is 82 year old presented [facility] on  09/01/22 for chest tightness. Pt with STEMI and underwent emergent CABG x 3 on 8/27. PMH - CAD, MI, HTN, lumbar disc disease, microdiskectomy   Clinical Impression   Pt ind at baseline with ADLs and functional mobility, lives with spouse who can assist at d/c. Pt currently needing set up -mod A for ADLs, min A for bed mobility and mod A for transfers with Carley Hammed walker, pt with difficulty initiating steps from EOB. Provided with handout and reviewed sternal prec with pt/spouse. Pt presenting with impairments listed below, will follow acutely. Recommend HHOT at d/c pending progression.       If plan is discharge home, recommend the following: A little help with walking and/or transfers;A lot of help with bathing/dressing/bathroom;Assistance with cooking/housework;Direct supervision/assist for medications management;Direct supervision/assist for financial management;Help with stairs or ramp for entrance    Functional Status Assessment  Patient has had a recent decline in their functional status and demonstrates the ability to make significant improvements in function in a reasonable and predictable amount of time.  Equipment Recommendations  BSC/3in1    Recommendations for Other Services PT consult     Precautions / Restrictions Precautions Precautions: Fall;Sternal Precaution Booklet Issued: Yes (comment) Precaution Comments: educated pt on sternal precautions Required Braces or Orthoses: Other Brace Other Brace: Has AFO at home Restrictions Weight Bearing Restrictions: Yes Other Position/Activity Restrictions: sternal precautions      Mobility Bed Mobility Overal bed mobility: Needs Assistance Bed Mobility: Sidelying to Sit   Sidelying to sit: Min assist       General bed mobility  comments: cues to use heart pillow and hips to scoot EOB    Transfers Overall transfer level: Needs assistance Equipment used:  Fara Boros) Transfers: Sit to/from Stand Sit to Stand: Mod assist                  Balance Overall balance assessment: Needs assistance Sitting-balance support: No upper extremity supported, Feet supported Sitting balance-Leahy Scale: Good     Standing balance support: Single extremity supported Standing balance-Leahy Scale: Poor Standing balance comment: UE support                           ADL either performed or assessed with clinical judgement   ADL Overall ADL's : Needs assistance/impaired Eating/Feeding: Set up;Sitting   Grooming: Set up;Sitting   Upper Body Bathing: Moderate assistance   Lower Body Bathing: Moderate assistance   Upper Body Dressing : Moderate assistance   Lower Body Dressing: Moderate assistance   Toilet Transfer: Minimal assistance   Toileting- Clothing Manipulation and Hygiene: Maximal assistance       Functional mobility during ADLs: Minimal assistance (eva walker)       Vision   Vision Assessment?: No apparent visual deficits     Perception Perception: Not tested       Praxis Praxis: Not tested       Pertinent Vitals/Pain Pain Assessment Pain Assessment: Faces Pain Score: 2  Faces Pain Scale: Hurts a little bit Pain Descriptors / Indicators: Grimacing, Guarding Pain Intervention(s): Limited activity within patient's tolerance, Monitored during session, Repositioned     Extremity/Trunk Assessment Upper Extremity Assessment Upper Extremity Assessment: Generalized weakness   Lower Extremity Assessment Lower Extremity Assessment: Generalized weakness   Cervical /  Trunk Assessment Cervical / Trunk Assessment: Normal   Communication Communication Communication: Hearing impairment   Cognition Arousal: Alert Behavior During Therapy: WFL for tasks assessed/performed Overall  Cognitive Status: Within Functional Limits for tasks assessed                                 General Comments: at times needing incr cues to follow commands; slowed processing      General Comments  VSS on supplemental O2 via [place]    Exercises     Shoulder Instructions      Home Living Family/patient expects to be discharged to:: Private residence Living Arrangements: Spouse/significant other Available Help at Discharge: Family;Available 24 hours/day Type of Home: House Home Access: Stairs to enter [employer] of Steps: 3 Entrance Stairs-Rails: Right Home Layout: Two level;Able to live on main level with bedroom/bathroom Alternate Level Stairs-Number of Steps: flight   Bathroom Shower/Tub: Walk-in shower         Home Equipment: Shower seat - built in          Prior Functioning/Environment Prior Level of Function : Independent/Modified Independent             Mobility Comments: Active. Goes to gym 3x/wk. Walks several miles several times a week. ADLs Comments: ind        OT Problem List: Decreased strength;Decreased activity tolerance;Decreased range of motion;Impaired balance (sitting and/or standing);Decreased cognition;Decreased safety awareness;Cardiopulmonary status limiting activity      OT Treatment/Interventions: Therapeutic exercise;Self-care/ADL training;Energy conservation;DME and/or AE instruction;Therapeutic activities;Patient/family education;Balance training    OT Goals(Current goals can be found in the care plan section) Acute Rehab OT Goals Patient Stated Goal: none stated OT Goal Formulation: With patient Time For Goal Achievement: 09/18/22 Potential to Achieve Goals: Good ADL Goals Pt Will Perform Upper Body Dressing: with min assist;sitting Pt Will Perform Lower Body Dressing: with min assist;sitting/lateral leans;sit to/from stand Pt Will Transfer to Toilet: with min assist;ambulating;regular height toilet  OT  Frequency: Min 1X/week    Co-evaluation              AM-PAC OT "6 Clicks" Daily Activity     Outcome Measure Help from another person eating meals?: None Help from another person taking care of personal grooming?: A Little Help from another person toileting, which includes using toliet, bedpan, or urinal?: A Lot Help from another person bathing (including washing, rinsing, drying)?: A Lot Help from another person to put on and taking off regular upper body clothing?: A Lot Help from another person to put on and taking off regular lower body clothing?: A Lot 6 Click Score: 15   End of Session Equipment Utilized During Treatment: Gait belt;Other (comment);Oxygen (eva walker) Nurse Communication: Mobility status  Activity Tolerance: Patient tolerated treatment well Patient left: Other (comment) (handoff to PT)  OT Visit Diagnosis: Unsteadiness on feet (R26.81);Other abnormalities of gait and mobility (R26.89);Muscle weakness (generalized) (M62.81)                Time: 6213-0865 OT Time Calculation (min): 31 min Charges:  OT General Charges $OT Visit: 1 Visit OT Evaluation $OT Eval Moderate Complexity: 1 Mod OT Treatments $Self Care/Home Management : 8-22 mins  Carver Fila, OTD, OTR/L SecureChat Preferred Acute Rehab [phone]   Carver Fila Koonce 09/04/2022, 5:05 PM

## 2022-09-04 NOTE — Progress Notes (Signed)
      [address]       [place] [address]             [phone]      3 Days Post-Op  Procedure(s) (LRB): CORONARY ARTERY BYPASS GRAFTING (CABG) times three using left internal mammary artery and right saphenous vein. (N/A) TRANSESOPHAGEAL ECHOCARDIOGRAM (N/A)   Total Length of Stay:  LOS: 3 days    SUBJECTIVE: Did get some sleep last night Ambulating  Vitals:   09/04/22 0600 09/04/22 0700  BP: (!) 107/59 110/63  Pulse: 65 63  Resp: 16 16  Temp:    SpO2: 97% 95%    Intake/Output      08/09 0701 08/10 0700 08/10 0701 08/11 0700   P.O. 240    I.V. (mL/kg)     IV Piggyback     Total Intake(mL/kg) 240 (2.9)    Urine (mL/kg/hr) 1400 (0.7)    Chest Tube 60    Total Output 1460    Net -1220         Urine Occurrence 2 x         CBC    Component Value Date/Time   WBC 9.4 09/04/2022 0158   RBC 3.41 (L) 09/04/2022 0158   HGB 10.0 (L) 09/04/2022 0158   HGB 15.1 01/01/2020 1531   HCT 30.3 (L) 09/04/2022 0158   HCT 44.7 01/01/2020 1531   PLT 109 (L) 09/04/2022 0158   PLT 200 01/01/2020 1531   MCV 88.9 09/04/2022 0158   MCV 87 01/01/2020 1531   MCH 29.3 09/04/2022 0158   MCHC 33.0 09/04/2022 0158   RDW 13.3 09/04/2022 0158   RDW 12.3 01/01/2020 1531   LYMPHSABS 1.8 09/01/2022 1525   MONOABS 0.7 09/01/2022 1525   EOSABS 0.1 09/01/2022 1525   BASOSABS 0.0 09/01/2022 1525   CMP     Component Value Date/Time   NA 130 (L) 09/04/2022 0158   NA 138 01/16/2020 0922   K 3.9 09/04/2022 0158   CL 97 (L) 09/04/2022 0158   CO2 22 09/04/2022 0158   GLUCOSE 125 (H) 09/04/2022 0158   BUN 20 09/04/2022 0158   BUN 19 01/16/2020 0922   CREATININE 1.35 (H) 09/04/2022 0158   CALCIUM 8.4 (L) 09/04/2022 0158   PROT 6.9 09/01/2022 1525   ALBUMIN 4.0 09/01/2022 1525   AST 36 09/01/2022 1525   ALT 29 09/01/2022 1525   ALKPHOS 44 09/01/2022 1525   BILITOT 0.8 09/01/2022 1525   GFRNONAA 52 (L) 09/04/2022 0158   GFRAA 68 01/16/2020 0922   ABG     Component Value Date/Time   PHART 7.355 09/02/2022 0619   PCO2ART 34.6 09/02/2022 0619   PO2ART 111 (H) 09/02/2022 0619   HCO3 19.4 (L) 09/02/2022 0619   TCO2 20 (L) 09/02/2022 0619   ACIDBASEDEF 6.0 (H) 09/02/2022 0619   O2SAT 98 09/02/2022 0619   CBG (last 3)  Recent Labs    09/03/22 1613 09/03/22 2127 09/04/22 0651  GLUCAP 124* 118* 115*  EXAM Lungs: decreased at bases Card: RR Ext: warm Neuro: alert  CXR with bibasilar consolidation  ASSESSMENT: POD #3 SP Emergent CABG Doing well Has orders to go to floor Cr elevated and will hold diuretics today Encourage ambulation   Eugenio Hoes, MD 09/04/2022

## 2022-09-04 NOTE — Progress Notes (Signed)
OT Cancellation Note  Patient Details Name: Collin [name] MRN: [id number] DOB: 16-Feb-1940   Cancelled Treatment:    Reason Eval/Treat Not Completed: Medical issues which prohibited therapy (RN reporting pt diaphoretic and hypotensive, tachycardic with standing this AM, request therapy attempt later. Will follow up for OT evaluation as schedule permits)  Carver Fila, OTD, OTR/L SecureChat Preferred Acute Rehab [phone]   Dalphine Handing 09/04/2022, 11:47 AM

## 2022-09-04 NOTE — Progress Notes (Signed)
Physical Therapy Treatment Patient Details Name: Collin [name] MRN: [id number] DOB: Dec 03, 1940 Today's Date: 09/04/2022   History of Present Illness Pt is 82 year old presented to [facility] on  09/01/22 for chest tightness. Pt with STEMI and underwent emergent CABG x 3 on 8/27. PMH - CAD, MI, HTN, lumbar disc disease, microdiskectomy    PT Comments  Pt was able to progress to ambulating an increased distance of up to ~380 ft before sitting to rest. He required an Scientist, research (medical) and minA for stability, displaying x2 posterior LOB bouts when stepping posteriorly. He was able to recover with minA. He needs repeated cues to maintain an upright posture when ambulating as well. Practiced sit <> stand transfers to improve strength and independence. Once educated to rock his trunk to gain momentum, he was able to progress from needing modA to only needing minA for transfers. He needs frequent reminders to comply with his sternal precautions when performing transfers though, especially when sitting back down from standing with the [facility] walker. Will continue to follow acutely.      If plan is discharge home, recommend the following: A little help with walking and/or transfers;A little help with bathing/dressing/bathroom;Assistance with cooking/housework;Assist for transportation;Help with stairs or ramp for entrance   Can travel by private vehicle        Equipment Recommendations  Rollator (4 wheels)    Recommendations for Other Services       Precautions / Restrictions Precautions Precautions: Fall;Sternal Precaution Booklet Issued: Yes (comment) Precaution Comments: educated pt on sternal precautions Required Braces or Orthoses: Other Brace Other Brace: Has AFO at home Restrictions Weight Bearing Restrictions: Yes Other Position/Activity Restrictions: sternal precautions     Mobility  Bed Mobility               General bed mobility comments: Pt sitting in recliner with OT upon arrival     Transfers Overall transfer level: Needs assistance Equipment used:  Carley Hammed walker) Transfers: Sit to/from Stand Sit to Stand: Mod assist, Min assist           General transfer comment: Pt needed modA to initiate powering up to stand from recliner the first x3 reps. Cues provided to scoot to edge and gain momentum by rocking trunk 3x with pt progressing to only needing minA for the subsequent x3 reps. Cues provided to keep hands on chest for sternal precautions compliance. Pt needs extensive cues to release Eva walker and place hands on chest prior to sitting. Cues for eccentric control and minA to control sitting down from standing.    Ambulation/Gait Ambulation/Gait assistance: Min assist, +2 safety/equipment Gait Distance (Feet): 380 Feet Assistive device: Fara Boros Gait Pattern/deviations: Step-through pattern, Decreased step length - right, Decreased step length - left, Decreased stride length, Trunk flexed Gait velocity: decr Gait velocity interpretation: <1.8 ft/sec, indicate of risk for recurrent falls   General Gait Details: Pt maintains a flexed trunk, needing repeated verbal and tactile cues to extend his hips and look up to improve his posture, momentary success noted. Pt needed minA for balance and Audrea Muscat in the halls, chair follow for safety. x2 posterior LOB bouts when stepping posteriorly, needing minA to recover.   Stairs             Wheelchair Mobility     Tilt Bed    Modified Rankin (Stroke Patients Only)       Balance Overall balance assessment: Needs assistance Sitting-balance support: No upper extremity supported, Feet supported  Sitting balance-Leahy Scale: Good     Standing balance support: Bilateral upper extremity supported, During functional activity, Reliant on assistive device for balance Standing balance-Leahy Scale: Poor Standing balance comment: Reliant on UE support and minA                             Cognition Arousal: Alert Behavior During Therapy: WFL for tasks assessed/performed Overall Cognitive Status: Within Functional Limits for tasks assessed                                 General Comments: HOH may be impacting processing speed of cues; needs reminders for sternal precautions compliance        Exercises Other Exercises Other Exercises: x5 serial sit <> stand reps from recliner with min-modA, no UE use    General Comments General comments (skin integrity, edema, etc.): VSS on supplemental O2 via [place]      Pertinent Vitals/Pain Pain Assessment Pain Assessment: Faces Faces Pain Scale: Hurts little more Pain Location: chest Pain Descriptors / Indicators: Grimacing, Guarding Pain Intervention(s): Limited activity within patient's tolerance, Monitored during session, Repositioned    Home Living Family/patient expects to be discharged to:: Private residence Living Arrangements: Spouse/significant other Available Help at Discharge: Family;Available 24 hours/day Type of Home: House Home Access: Stairs to enter Entrance Stairs-Rails: Right Entrance Stairs-Number of Steps: 3 Alternate Level Stairs-Number of Steps: flight Home Layout: Two level;Able to live on main level with bedroom/bathroom Home Equipment: Shower seat - built in      Prior Function            PT Goals (current goals can now be found in the care plan section) Acute Rehab PT Goals Patient Stated Goal: return to independence PT Goal Formulation: With patient/family Time For Goal Achievement: 09/17/22 Potential to Achieve Goals: Good Progress towards PT goals: Progressing toward goals    Frequency    Min 1X/week      PT Plan      Co-evaluation              AM-PAC PT "6 Clicks" Mobility   Outcome Measure  Help needed turning from your back to your side while in a flat bed without using bedrails?: A Little Help needed moving from lying on your back to sitting on the  side of a flat bed without using bedrails?: A Little Help needed moving to and from a bed to a chair (including a wheelchair)?: A Little Help needed standing up from a chair using your arms (e.g., wheelchair or bedside chair)?: A Lot Help needed to walk in hospital room?: A Little Help needed climbing 3-5 steps with a railing? : A Lot 6 Click Score: 16    End of Session Equipment Utilized During Treatment: Gait belt;Oxygen Activity Tolerance: Patient tolerated treatment well Patient left: with call bell/phone within reach;with family/visitor present;Other (comment) (on commode) Nurse Communication: Mobility status;Other (comment) (vitals; pt on commode) PT Visit Diagnosis: Unsteadiness on feet (R26.81);Other abnormalities of gait and mobility (R26.89);Muscle weakness (generalized) (M62.81)     Time: 1610-9604 PT Time Calculation (min) (ACUTE ONLY): 17 min  Charges:    $Therapeutic Activity: 8-22 mins PT General Charges $$ ACUTE PT VISIT: 1 Visit                     Raymond Gurney, PT, DPT Acute Rehabilitation Services  Office:  [phone]    Henrene Dodge Pettis 09/04/2022, 4:58 PM

## 2022-09-05 DIAGNOSIS — I480 Paroxysmal atrial fibrillation: Secondary | ICD-10-CM

## 2022-09-05 DIAGNOSIS — I2101 ST elevation (STEMI) myocardial infarction involving left main coronary artery: Secondary | ICD-10-CM | POA: Diagnosis not present

## 2022-09-05 LAB — CBC
HCT: 29.5 % — ABNORMAL LOW (ref 39.0–52.0)
Hemoglobin: 9.7 g/dL — ABNORMAL LOW (ref 13.0–17.0)
MCH: 28.5 pg (ref 26.0–34.0)
MCHC: 32.9 g/dL (ref 30.0–36.0)
MCV: 86.8 fL (ref 80.0–100.0)
Platelets: 142 10*3/uL — ABNORMAL LOW (ref 150–400)
RBC: 3.4 MIL/uL — ABNORMAL LOW (ref 4.22–5.81)
RDW: 13.2 % (ref 11.5–15.5)
WBC: 8.9 10*3/uL (ref 4.0–10.5)
nRBC: 0 % (ref 0.0–0.2)

## 2022-09-05 LAB — BASIC METABOLIC PANEL WITH GFR
Anion gap: 9 (ref 5–15)
BUN: 21 mg/dL (ref 8–23)
CO2: 25 mmol/L (ref 22–32)
Calcium: 8.5 mg/dL — ABNORMAL LOW (ref 8.9–10.3)
Chloride: 97 mmol/L — ABNORMAL LOW (ref 98–111)
Creatinine, Ser: 1.28 mg/dL — ABNORMAL HIGH (ref 0.61–1.24)
GFR, Estimated: 55 mL/min — ABNORMAL LOW (ref >60)
Glucose, Bld: 128 mg/dL — ABNORMAL HIGH (ref 70–99)
Potassium: 3.9 mmol/L (ref 3.5–5.1)
Sodium: 131 mmol/L — ABNORMAL LOW (ref 135–145)

## 2022-09-05 LAB — GLUCOSE, CAPILLARY
Glucose-Capillary: 156 mg/dL — ABNORMAL HIGH (ref 70–99)
Glucose-Capillary: 152 mg/dL — ABNORMAL HIGH (ref 70–99)
Glucose-Capillary: 123 mg/dL — ABNORMAL HIGH (ref 70–99)

## 2022-09-05 MED ORDER — FUROSEMIDE 10 MG/ML IJ SOLN
40.0000 mg | Freq: Once | INTRAMUSCULAR | Status: AC
  Administered 2022-09-05: 40 mg via INTRAVENOUS
  Filled 2022-09-05: qty 4

## 2022-09-05 MED ORDER — LACTULOSE 10 GM/15ML PO SOLN: 15.0000 g | Freq: Two times a day (BID) | ORAL | Status: DC

## 2022-09-05 MED ORDER — POLYETHYLENE GLYCOL 3350 17 G PO PACK: 17.0000 g | PACK | Freq: Two times a day (BID) | ORAL | Status: DC

## 2022-09-05 MED ORDER — AMIODARONE IV BOLUS ONLY 150 MG/100ML
150.0000 mg | Freq: Once | INTRAVENOUS | Status: AC
  Administered 2022-09-05: 150 mg via INTRAVENOUS

## 2022-09-05 MED ORDER — POLYETHYLENE GLYCOL 3350 17 G PO PACK
17.0000 g | PACK | Freq: Every day | ORAL | Status: DC
  Administered 2022-09-05: 17 g via ORAL
  Filled 2022-09-05: qty 1

## 2022-09-05 MED ORDER — LACTULOSE 10 GM/15ML PO SOLN
15.0000 g | Freq: Once | ORAL | Status: AC
  Administered 2022-09-05: 15 g via ORAL
  Filled 2022-09-05: qty 30

## 2022-09-05 MED ORDER — AMIODARONE HCL 200 MG PO TABS
400.0000 mg | ORAL_TABLET | Freq: Three times a day (TID) | ORAL | Status: DC
  Administered 2022-09-05 – 2022-09-08 (×11): 400 mg via ORAL
  Filled 2022-09-05 (×11): qty 2

## 2022-09-05 NOTE — Plan of Care (Signed)
Problem: Education: Goal: Knowledge of General Education information will improve Description: Including pain rating scale, medication(s)/side effects and non-pharmacologic comfort measures Outcome: Progressing   Problem: Health Behavior/Discharge Planning: Goal: Ability to manage health-related needs will improve Outcome: Progressing   Problem: Clinical Measurements: Goal: Ability to maintain clinical measurements within normal limits will improve Outcome: Progressing Goal: Will remain free from infection Outcome: Progressing Goal: Diagnostic test results will improve Outcome: Progressing Goal: Respiratory complications will improve Outcome: Progressing Goal: Cardiovascular complication will be avoided Outcome: Progressing   Problem: Activity: Goal: Risk for activity intolerance will decrease Outcome: Progressing   Problem: Nutrition: Goal: Adequate nutrition will be maintained Outcome: Progressing   Problem: Coping: Goal: Level of anxiety will decrease Outcome: Progressing   Problem: Elimination: Goal: Will not experience complications related to bowel motility Outcome: Progressing Goal: Will not experience complications related to urinary retention Outcome: Progressing   Problem: Pain Managment: Goal: General experience of comfort will improve Outcome: Progressing   Problem: Safety: Goal: Ability to remain free from injury will improve Outcome: Progressing   Problem: Skin Integrity: Goal: Risk for impaired skin integrity will decrease Outcome: Progressing   Problem: Education: Goal: Understanding of cardiac disease, CV risk reduction, and recovery process will improve Outcome: Progressing Goal: Individualized Educational Video(s) Outcome: Progressing   Problem: Activity: Goal: Ability to tolerate increased activity will improve Outcome: Progressing   Problem: Cardiac: Goal: Ability to achieve and maintain adequate cardiovascular perfusion will  improve Outcome: Progressing   Problem: Health Behavior/Discharge Planning: Goal: Ability to safely manage health-related needs after discharge will improve Outcome: Progressing   Problem: Education: Goal: Will demonstrate proper wound care and an understanding of methods to prevent future damage Outcome: Progressing Goal: Knowledge of disease or condition will improve Outcome: Progressing Goal: Knowledge of the prescribed therapeutic regimen will improve Outcome: Progressing Goal: Individualized Educational Video(s) Outcome: Progressing   Problem: Activity: Goal: Risk for activity intolerance will decrease Outcome: Progressing   Problem: Cardiac: Goal: Will achieve and/or maintain hemodynamic stability Outcome: Progressing   Problem: Clinical Measurements: Goal: Postoperative complications will be avoided or minimized Outcome: Progressing   Problem: Respiratory: Goal: Respiratory status will improve Outcome: Progressing   Problem: Skin Integrity: Goal: Wound healing without signs and symptoms of infection Outcome: Progressing Goal: Risk for impaired skin integrity will decrease Outcome: Progressing   Problem: Urinary Elimination: Goal: Ability to achieve and maintain adequate renal perfusion and functioning will improve Outcome: Progressing   Problem: Education: Goal: Understanding of CV disease, CV risk reduction, and recovery process will improve Outcome: Progressing Goal: Individualized Educational Video(s) Outcome: Progressing   Problem: Activity: Goal: Ability to return to baseline activity level will improve Outcome: Progressing   Problem: Cardiovascular: Goal: Ability to achieve and maintain adequate cardiovascular perfusion will improve Outcome: Progressing Goal: Vascular access site(s) Level 0-1 will be maintained Outcome: Progressing   Problem: Health Behavior/Discharge Planning: Goal: Ability to safely manage health-related needs after discharge  will improve Outcome: Progressing   Problem: Education: Goal: Will demonstrate proper wound care and an understanding of methods to prevent future damage Outcome: Progressing Goal: Knowledge of disease or condition will improve Outcome: Progressing Goal: Knowledge of the prescribed therapeutic regimen will improve Outcome: Progressing Goal: Individualized Educational Video(s) Outcome: Progressing   Problem: Activity: Goal: Risk for activity intolerance will decrease Outcome: Progressing   Problem: Cardiac: Goal: Will achieve and/or maintain hemodynamic stability Outcome: Progressing   Problem: Clinical Measurements: Goal: Postoperative complications will be avoided or minimized Outcome: Progressing   Problem: Respiratory: Goal:  Respiratory status will improve Outcome: Progressing   Problem: Skin Integrity: Goal: Wound healing without signs and symptoms of infection Outcome: Progressing Goal: Risk for impaired skin integrity will decrease Outcome: Progressing   Problem: Urinary Elimination: Goal: Ability to achieve and maintain adequate renal perfusion and functioning will improve Outcome: Progressing

## 2022-09-05 NOTE — Progress Notes (Signed)
      [address]       [place] [address]             [phone]      4 Days Post-Op  Procedure(s) (LRB): CORONARY ARTERY BYPASS GRAFTING (CABG) times three using left internal mammary artery and right saphenous vein. (N/A) TRANSESOPHAGEAL ECHOCARDIOGRAM (N/A)   Total Length of Stay:  LOS: 4 days    SUBJECTIVE: Was uncomfortable last night Went back into afib at 6 am rate controlled  Vitals:   09/05/22 0645 09/05/22 0700  BP: 118/82 96/65  Pulse: 88 86  Resp: 18 16  Temp:    SpO2: 97% 97%    Intake/Output      08/10 0701 08/11 0700 08/11 0701 08/12 0700   P.O. 600    I.V. (mL/kg) 191.5 (2.2)    Total Intake(mL/kg) 791.5 (9.2)    Urine (mL/kg/hr) 750 (0.4)    Chest Tube     Total Output 750    Net +41.5             amiodarone      CBC    Component Value Date/Time   WBC 8.9 09/05/2022 0159   RBC 3.40 (L) 09/05/2022 0159   HGB 9.7 (L) 09/05/2022 0159   HGB 15.1 01/01/2020 1531   HCT 29.5 (L) 09/05/2022 0159   HCT 44.7 01/01/2020 1531   PLT 142 (L) 09/05/2022 0159   PLT 200 01/01/2020 1531   MCV 86.8 09/05/2022 0159   MCV 87 01/01/2020 1531   MCH 28.5 09/05/2022 0159   MCHC 32.9 09/05/2022 0159   RDW 13.2 09/05/2022 0159   RDW 12.3 01/01/2020 1531   LYMPHSABS 1.8 09/01/2022 1525   MONOABS 0.7 09/01/2022 1525   EOSABS 0.1 09/01/2022 1525   BASOSABS 0.0 09/01/2022 1525   CMP     Component Value Date/Time   NA 131 (L) 09/05/2022 0159   NA 138 01/16/2020 0922   K 3.9 09/05/2022 0159   CL 97 (L) 09/05/2022 0159   CO2 25 09/05/2022 0159   GLUCOSE 128 (H) 09/05/2022 0159   BUN 21 09/05/2022 0159   BUN 19 01/16/2020 0922   CREATININE 1.28 (H) 09/05/2022 0159   CALCIUM 8.5 (L) 09/05/2022 0159   PROT 6.9 09/01/2022 1525   ALBUMIN 4.0 09/01/2022 1525   AST 36 09/01/2022 1525   ALT 29 09/01/2022 1525   ALKPHOS 44 09/01/2022 1525   BILITOT 0.8 09/01/2022 1525   GFRNONAA 55 (L) 09/05/2022 0159   GFRAA 68 01/16/2020 0922    ABG    Component Value Date/Time   PHART 7.355 09/02/2022 0619   PCO2ART 34.6 09/02/2022 0619   PO2ART 111 (H) 09/02/2022 0619   HCO3 19.4 (L) 09/02/2022 0619   TCO2 20 (L) 09/02/2022 0619   ACIDBASEDEF 6.0 (H) 09/02/2022 0619   O2SAT 98 09/02/2022 0619   CBG (last 3)  Recent Labs    09/04/22 1624 09/04/22 2059 09/05/22 0607  GLUCAP 119* 123* 156*  EXAM Lungs: clear Card: IRR Ext: warm Neuro:intact   ASSESSMENT: POD #4 SP Emergent cabg Atrial fibrillation: recurrent. Amio bolus then increase po to tid. If remains in today will start noac Pulmonary: still on Nasal oxygen. Lasix today Continue aggressive ambulation and chest PT Leave in unit today   Eugenio Hoes, MD 09/05/2022

## 2022-09-05 NOTE — Progress Notes (Signed)
NAME:  Collin [name], MRN:  [id number], DOB:  12-11-1940, LOS: 4 ADMISSION DATE:  09/01/2022, CONSULTATION DATE: 09/02/2022 REFERRING MD: Dr. Leafy Ro, CHIEF COMPLAINT: Acute NSTEMI s/p CABG  History of Present Illness:  82 year old male with coronary artery disease, hypertension and hyperlipidemia who developed chest pain while doing push-ups at gym, brought into the emergency department, EKG showed inferior STEMI, underwent cardiac catheter which showed severe left main disease and open RCA, IABP was placed, CT surgery was consulted for evaluation of CABG.  Patient underwent emergent CABG x 3.  Remained intubated and was transferred to ICU.  PCCM was consulted for help evaluation medical management  Pertinent  Medical History   Past Medical History:  Diagnosis Date   Allergy    Arthritis    BPH (benign prostatic hyperplasia)    CAD (coronary artery disease)    Cataract    Diverticulosis of colon    Hx of adenomatous colonic polyps 05/22/2014   Hyperlipidemia    Hypertension    MYOCARDIAL INFARCTION, HX OF 07/20/2006   Qualifier: Diagnosis of  By: Cato Mulligan MD, [facility] Events: Including procedures, antibiotic start and stop dates in addition to other pertinent events     Interim History / Subjective:  Stated was uncomfortable at night, could not sleep well Denies nausea vomiting but he stated poor appetite Did not have bowel movement yet  Objective   Blood pressure 96/65, pulse 86, temperature 98.3 F (36.8 C), temperature source Oral, resp. rate 16, weight 86.5 kg, SpO2 97%.        Intake/Output Summary (Last 24 hours) at 09/05/2022 0744 Last data filed at 09/05/2022 0600 Gross per 24 hour  Intake 791.48 ml  Output 750 ml  Net 41.48 ml   Filed Weights   09/03/22 0556 09/04/22 0400 09/05/22 0500  Weight: 90.7 kg 81.7 kg 86.5 kg    Examination: General: Elderly male, lying on the bed HEENT: [place]/AT, eyes anicteric.  moist mucus membranes Neuro:  Alert, awake following commands, generalized weak Chest: Reduced air entry at the bases bilaterally but otherwise clear, no wheezes or rhonchi Heart: Irregularly irregular, no murmurs or gallops Abdomen: Soft, nontender, nondistended, bowel sounds present Skin: No rash  Labs and images were reviewed  Resolved Hospital Problem list   Acute respiratory insufficiency, postop Cardiogenic shock status post IABP  Assessment & Plan:  Coronary artery disease, acute STEMI s/p emergent CABG x 3 Paroxysmal A-fib Continue aspirin, Plavix and statin Went into A-fib yesterday with controlled rate, he was given amiodarone IV bolus 150 mg x 1 and then was started on amiodarone infusion Still remain in A-fib with controlled rate between 80-90 Continue metoprolol Chest tubes are out Continue pain control with tramadol, oxycodone and morphine Encourage incentive spirometry  Hypertension Continue metoprolol  Hyperlipidemia Continue atorvastatin  Prediabetes  Patient hemoglobin A1c is 6.0 Blood sugars are controlled Continue sliding scale insulin with CBG goal 140-180  Expected perioperative blood loss anemia Thrombocytopenia due to CPB Monitor H/H and PLT counts Platelet counts are improving, currently at 142  CKD stage IIIa Hypervolemic hyponatremia Serum creatinine is stable Serum sodium remained at 131 Avoid nephrotoxic agent Monitor electrolytes  BPH Continue tamsulosin obstipation Patient stated that no bowel movement for few days  Continue Dulcolax, Senokot MiraLAX was added   Best Practice (right click and "Reselect all SmartList Selections" daily)   Diet/type: Regular consistency DVT prophylaxis: Enoxaparin GI prophylaxis: PPI Lines: N/A Foley: N/A Code Status:  full  code Last date of multidisciplinary goals of care discussion [Per primary team]   Labs   CBC: Recent Labs  Lab 09/01/22 1525 09/01/22 1655 09/02/22 0315 09/02/22 0412 09/02/22 0619  09/02/22 1727 09/03/22 0430 09/04/22 0158 09/05/22 0159  WBC 8.7   < > 6.4  --   --  9.1 8.7 9.4 8.9  NEUTROABS 6.0  --   --   --   --   --   --   --   --   HGB 15.0  14.7   < > 9.4*   < > 9.9* 10.7* 9.9* 10.0* 9.7*  HCT 44.0  43.6   < > 28.9*   < > 29.0* 32.8* 30.1* 30.3* 29.5*  MCV 86.7   < > 87.0  --   --  88.9 87.2 88.9 86.8  PLT 208   < > 101*  --   --  121* 111* 109* 142*   < > = values in this interval not displayed.    Basic Metabolic Panel: Recent Labs  Lab 09/02/22 0315 09/02/22 0412 09/02/22 0619 09/02/22 1727 09/03/22 0430 09/04/22 0158 09/05/22 0159  NA 135   < > 135 134* 134* 130* 131*  K 4.2   < > 4.3 3.9 4.0 3.9 3.9  CL 108  --   --  100 97* 97* 97*  CO2 19*  --   --  24 26 22 25   GLUCOSE 144*  --   --  139* 122* 125* 128*  BUN 15  --   --  13 14 20 21   CREATININE 1.10  --   --  1.27* 1.28* 1.35* 1.28*  CALCIUM 7.5*  --   --  8.2* 8.4* 8.4* 8.5*  MG 2.8*  --   --  2.2  --   --   --    < > = values in this interval not displayed.   GFR: Estimated Creatinine Clearance: 48.6 mL/min (A) (by C-G formula based on SCr of 1.28 mg/dL (H)). Recent Labs  Lab 09/01/22 1525 09/01/22 2106 09/02/22 1727 09/03/22 0430 09/04/22 0158 09/05/22 0159  WBC 8.7   < > 9.1 8.7 9.4 8.9  LATICACIDVEN 0.7  --   --   --   --   --    < > = values in this interval not displayed.    Liver Function Tests: Recent Labs  Lab 09/01/22 1525  AST 36  ALT 29  ALKPHOS 44  BILITOT 0.8  PROT 6.9  ALBUMIN 4.0   No results for input(s): "LIPASE", "AMYLASE" in the last 168 hours. No results for input(s): "AMMONIA" in the last 168 hours.  ABG    Component Value Date/Time   PHART 7.355 09/02/2022 0619   PCO2ART 34.6 09/02/2022 0619   PO2ART 111 (H) 09/02/2022 0619   HCO3 19.4 (L) 09/02/2022 0619   TCO2 20 (L) 09/02/2022 0619   ACIDBASEDEF 6.0 (H) 09/02/2022 0619   O2SAT 98 09/02/2022 0619     Coagulation Profile: Recent Labs  Lab 09/01/22 1525 09/01/22 2106  INR 1.1  1.5*    Cardiac Enzymes: No results for input(s): "CKTOTAL", "CKMB", "CKMBINDEX", "TROPONINI" in the last 168 hours.  HbA1C: HbA1c, POC (prediabetic range)  Date/Time Value Ref Range Status  01/05/2022 04:04 PM 5.7 5.7 - 6.4 % Final   Hgb A1c MFr Bld  Date/Time Value Ref Range Status  09/01/2022 03:25 PM 6.0 (H) 4.8 - 5.6 % Final    Comment:    (NOTE) Pre  diabetes:          5.7%-6.4%  Diabetes:              >6.4%  Glycemic control for   <7.0% adults with diabetes   07/15/2021 02:16 PM 6.3 4.6 - 6.5 % Final    Comment:    Glycemic Control Guidelines for People with Diabetes:Non Diabetic:  <6%Goal of Therapy: <7%Additional Action Suggested:  >8%     CBG: Recent Labs  Lab 09/04/22 0651 09/04/22 1132 09/04/22 1624 09/04/22 2059 09/05/22 0607  GLUCAP 115* 157* 119* 123* 156*       Cheri Fowler, MD [facility] See Amion for pager If no response to pager, please call [phone] until 7pm After 7pm, Please call E-link [phone]

## 2022-09-05 NOTE — Progress Notes (Signed)
   09/05/22 0550  What Happened  Who witnessed fall? Willy Eddy RN, Harvie Junior RN, Valla Leaver RN  Patients activity before fall ambulating-assisted  Point of contact other (comment) (Assisted to floor, buttock made floor contact)  Was patient injured? No  Provider Notification  Provider Name/Title Dr Leafy Ro  Date Provider Notified 09/05/22  Time Provider Notified [phone]  Method of Notification Call  Notification Reason Fall (Afib)  Provider response Other (Comment) (Give Amiodarone PO dose scheduled for 10 am now.)  Date of Provider Response 09/05/22  Time of Provider Response [phone]  Follow Up  Family notified No - patient refusal  Additional tests No  Progress note created (see row info) Yes  Adult Fall Risk Assessment  Risk Factor Category (scoring not indicated) High fall risk per protocol (document High fall risk);Fall has occurred during this admission (document High fall risk)  Patient Fall Risk Level High fall risk  Adult Fall Risk Interventions  Required Bundle Interventions *See Row Information* High fall risk - low, moderate, and high requirements implemented  Additional Interventions PT/OT need assessed if change in mobility from baseline;Reorient/diversional activities with confused patients;Use of appropriate toileting equipment (bedpan, BSC, etc.);Room near nurses station  Fall intervention(s) refused/Patient educated regarding refusal Bed alarm;Nonskid socks;Open door if unsupervised;Yellow bracelet;Supervision while toileting/edge of bed sitting  Screening for Fall Injury Risk (To be completed on HIGH fall risk patients) - Assessing Need for Floor Mats  Risk For Fall Injury- Criteria for Floor Mats Previous fall this admission  Will Implement Floor Mats Yes  Vitals  Pulse Rate 90  ECG Heart Rate 88  Resp (!) 23  Oxygen Therapy  SpO2 95 %  O2 Device Nasal Cannula  O2 Flow Rate (L/min) 4 L/min  Patient Activity (if Appropriate) Ambulating  Pulse Oximetry Type  Continuous  Pain Assessment  Pain Scale 0-10  Pain Score 0  Neurological  Neuro (WDL) WDL  Level of Consciousness Alert  Orientation Level Oriented X4  Cognition Appropriate at baseline;Follows commands  Speech Clear  R Pupil Size (mm) 3  R Pupil Shape Round  R Pupil Reaction Brisk  L Pupil Size (mm) 3  L Pupil Shape Round  L Pupil Reaction Brisk  RUE Motor Response Purposeful movement  LUE Motor Response Purposeful movement  RLE Motor Response Purposeful movement  LLE Motor Response Purposeful movement  Neuro Symptoms Fatigue  Glasgow Coma Scale  Eye Opening 4  Best Verbal Response (NON-intubated) 5  Best Motor Response 6  Glasgow Coma Scale Score 15  Musculoskeletal  Musculoskeletal (WDL) X  Generalized Weakness Yes  Weight Bearing Restrictions Yes (sternal precautions)  RLE Weight Bearing NWB  LLE Weight Bearing NWB  Musculoskeletal Details  RLE Weakness;Limited movement  LLE Weakness;Limited movement  Integumentary  Integumentary (WDL) X  Skin Color Pale  Skin Condition Diaphoretic

## 2022-09-06 MED ORDER — METOPROLOL TARTRATE 12.5 MG HALF TABLET
12.5000 mg | ORAL_TABLET | Freq: Two times a day (BID) | ORAL | Status: DC
  Administered 2022-09-06 – 2022-09-09 (×7): 12.5 mg via ORAL
  Filled 2022-09-06 (×7): qty 1

## 2022-09-06 MED ORDER — METOPROLOL TARTRATE 25 MG/10 ML ORAL SUSPENSION
12.5000 mg | Freq: Two times a day (BID) | ORAL | Status: DC
  Filled 2022-09-06 (×7): qty 5

## 2022-09-06 MED ORDER — FUROSEMIDE 40 MG PO TABS
40.0000 mg | ORAL_TABLET | Freq: Every day | ORAL | Status: DC
  Administered 2022-09-06 – 2022-09-09 (×4): 40 mg via ORAL
  Filled 2022-09-06 (×4): qty 1

## 2022-09-06 MED ORDER — POTASSIUM CHLORIDE CRYS ER 20 MEQ PO TBCR
20.0000 meq | EXTENDED_RELEASE_TABLET | Freq: Every day | ORAL | Status: DC
  Administered 2022-09-06 – 2022-09-09 (×4): 20 meq via ORAL
  Filled 2022-09-06 (×4): qty 1

## 2022-09-06 NOTE — Progress Notes (Signed)
Physical Therapy Treatment Patient Details Name: Collin [name] MRN: [id number] DOB: [birth year] Today's Date: 09/06/2022   History of Present Illness Pt is 82 year old presented to [facility] on  09/01/22 for chest tightness. Pt with STEMI and underwent emergent CABG x 3 on 8/27. PMH - CAD, MI, HTN, lumbar disc disease, microdiskectomy    PT Comments  Patient progressing with ambulation and transfers and balance.  Some core weakness evident with flexing posture further down until cues given for head upright.  He reports having worked on balance and doing exercises previously.  Open to follow up therapy and would benefit from outpatient PT at d/c.  PT will continue to follow in the acute setting.     If plan is discharge home, recommend the following: A little help with walking and/or transfers;A little help with bathing/dressing/bathroom;Assistance with cooking/housework;Assist for transportation;Help with stairs or ramp for entrance   Can travel by private vehicle        Equipment Recommendations  Rollator (4 wheels)    Recommendations for Other Services       Precautions / Restrictions Precautions Precautions: Fall;Sternal Required Braces or Orthoses: Other Brace Other Brace: foot up brace on L Restrictions Other Position/Activity Restrictions: sternal precautions     Mobility  Bed Mobility Overal bed mobility: Needs Assistance Bed Mobility: Rolling, Sidelying to Sit, Sit to Sidelying Rolling: Supervision Sidelying to sit: Contact guard assist     Sit to sidelying: Min assist General bed mobility comments: some assist to rise for balance with trunk, assist for leg onto bed    Transfers Overall transfer level: Needs assistance Equipment used: Rolling walker (2 wheels) Transfers: Sit to/from Stand Sit to Stand: From elevated surface, Min assist, Contact guard assist           General transfer comment: cues for technique, initially falling backward onto bed with min A for  safety, then with one hand on heart pillow and one on RA and cues for weight over his feet able to stand with less support, still CGA for safety, elevated bed to ensure hips not below knees; also demonstrated technique for sit>stand and stand>sit    Ambulation/Gait Ambulation/Gait assistance: Contact guard assist Gait Distance (Feet): 220 Feet Assistive device: Rolling walker (2 wheels) Gait Pattern/deviations: Decreased stride length, Trunk flexed, Decreased dorsiflexion - left       General Gait Details: cues throughout to keep head up as flexed posture and at times more anterior bias, wearing shoes already with foot up brace on L due to foot drop   Stairs             Wheelchair Mobility     Tilt Bed    Modified Rankin (Stroke Patients Only)       Balance Overall balance assessment: Needs assistance Sitting-balance support: Feet supported Sitting balance-Leahy Scale: Good     Standing balance support: Bilateral upper extremity supported, Reliant on assistive device for balance Standing balance-Leahy Scale: Poor Standing balance comment: UE support for balance                            Cognition Arousal: Alert Behavior During Therapy: WFL for tasks assessed/performed Overall Cognitive Status: Impaired/Different from baseline Area of Impairment: Safety/judgement, Following commands, Memory                       Following Commands: Follows multi-step commands consistently, Follows multi-step commands with increased time  General Comments: admits to some cognitive issues since surgery, seeing "ants" on the wall and feeling disoriented at times        Exercises      General Comments General comments (skin integrity, edema, etc.): on RA throughout, spot check x 1 with SpO2 92% or greater, HR maintained in 90's during ambulation.  Reports concern for bowels as had two episodes of incontinence overnight after being given a lot to help  to go due to constipation; states was told not to push, but feels he needs to in order to go.  Educated okay to wait if not having incontinence until he feels he can go without pushing.  Patient also had some urinary incontinence (was wearing primofit) so assisted to change gown and bed pad      Pertinent Vitals/Pain Pain Assessment Faces Pain Scale: Hurts little more Pain Location: chest Pain Descriptors / Indicators: Discomfort, Sore Pain Intervention(s): Monitored during session    Home Living                          Prior Function            PT Goals (current goals can now be found in the care plan section) Progress towards PT goals: Progressing toward goals    Frequency    Min 1X/week      PT Plan      Co-evaluation              AM-PAC PT "6 Clicks" Mobility   Outcome Measure  Help needed turning from your back to your side while in a flat bed without using bedrails?: A Little Help needed moving from lying on your back to sitting on the side of a flat bed without using bedrails?: A Little Help needed moving to and from a bed to a chair (including a wheelchair)?: A Little Help needed standing up from a chair using your arms (e.g., wheelchair or bedside chair)?: A Little Help needed to walk in hospital room?: A Little Help needed climbing 3-5 steps with a railing? : Total 6 Click Score: 16    End of Session Equipment Utilized During Treatment: Gait belt Activity Tolerance: Patient limited by fatigue Patient left: in bed;with call bell/phone within reach   PT Visit Diagnosis: Unsteadiness on feet (R26.81);Other abnormalities of gait and mobility (R26.89);Muscle weakness (generalized) (M62.81)     Time: 2956-2130 PT Time Calculation (min) (ACUTE ONLY): 26 min  Charges:    $Gait Training: 8-22 mins $Therapeutic Activity: 8-22 mins PT General Charges $$ ACUTE PT VISIT: 1 Visit                     Sheran Lawless, PT Acute Rehabilitation  Services Office:[phone] 09/06/2022    Elray Mcgregor 09/06/2022, 2:13 PM

## 2022-09-06 NOTE — Progress Notes (Signed)
CARDIAC REHAB PHASE I    Pt resting in bed after transfer to 4E today. Encouraged mobility, IS use and OOB as much as possible. OT/PT to see today. Will continue to follow to assist with mobility needs as able, depending on mobility progress, and education needs prior to discharge.   [id number]    Woodroe Chen, RN BSN 09/06/2022 1:45 PM

## 2022-09-06 NOTE — Progress Notes (Addendum)
Mobility Specialist Progress Note:   09/06/22 1604  Mobility  Activity Refused mobility    Pt kindly refused mobility d/t family being in room and already walking twice today. Will f/u as able.    Collin [name]  Mobility Specialist Please contact via [employer] office at [phone]

## 2022-09-06 NOTE — Progress Notes (Signed)
Occupational Therapy Treatment Patient Details Name: Collin [name] MRN: [id number] DOB: 05/04/1940 Today's Date: 09/06/2022   History of present illness Pt is 82 year old presented to [facility] on  09/01/22 for chest tightness. Pt with STEMI and underwent emergent CABG x 3 on 8/27. PMH - CAD, MI, HTN, lumbar disc disease, microdiskectomy   OT comments  Pt progressing towards goals this session, able to use RW for mobility vs eva walker. Pt able to complete seated UB dressing with min A, set up A for standing grooming tasks, max A for LB ADL. Pt able to walk hallway distance with CGA, cues for sternal prec. Pt presenting with impairments listed below, will follow acutely. Continue to recommend HHOT at d/c.       If plan is discharge home, recommend the following:  A little help with walking and/or transfers;A lot of help with bathing/dressing/bathroom;Assistance with cooking/housework;Direct supervision/assist for medications management;Direct supervision/assist for financial management;Help with stairs or ramp for entrance   Equipment Recommendations  BSC/3in1    Recommendations for Other Services PT consult    Precautions / Restrictions Precautions Precautions: Fall;Sternal Precaution Booklet Issued: Yes (comment) Precaution Comments: educated pt on sternal precautions Required Braces or Orthoses: Other Brace Other Brace: foot up brace on L Restrictions Other Position/Activity Restrictions: sternal precautions       Mobility Bed Mobility Overal bed mobility: Needs Assistance Bed Mobility: Rolling, Sidelying to Sit, Sit to Sidelying Rolling: Supervision Sidelying to sit: Contact guard assist     Sit to sidelying: Min assist General bed mobility comments: some assist to rise for balance with trunk, assist for leg onto bed    Transfers Overall transfer level: Needs assistance Equipment used: Rolling walker (2 wheels) Transfers: Sit to/from Stand Sit to Stand: From elevated  surface, Min assist, Contact guard assist           General transfer comment: cues for sternal prec     Balance Overall balance assessment: Needs assistance Sitting-balance support: Feet supported Sitting balance-Leahy Scale: Good     Standing balance support: Bilateral upper extremity supported, Reliant on assistive device for balance Standing balance-Leahy Scale: Poor Standing balance comment: UE support for balance                           ADL either performed or assessed with clinical judgement   ADL Overall ADL's : Needs assistance/impaired     Grooming: Wash/dry face;Standing;Set up Grooming Details (indicate cue type and reason): standing at sink         Upper Body Dressing : Minimal assistance;Sitting   Lower Body Dressing: Maximal assistance;Sitting/lateral leans Lower Body Dressing Details (indicate cue type and reason): donning shoes             Functional mobility during ADLs: Minimal assistance;Rolling walker (2 wheels)      Extremity/Trunk Assessment Upper Extremity Assessment Upper Extremity Assessment: Generalized weakness   Lower Extremity Assessment Lower Extremity Assessment: Generalized weakness LLE Deficits / Details: Lt foot drop        Vision   Vision Assessment?: No apparent visual deficits   Perception Perception Perception: Not tested   Praxis Praxis Praxis: Not tested    Cognition Arousal: Alert Behavior During Therapy: WFL for tasks assessed/performed Overall Cognitive Status: Impaired/Different from baseline Area of Impairment: Safety/judgement, Following commands, Memory  General Comments: reports some cognitive deficits since surgery, unable to recall precautions.        Exercises      Shoulder Instructions       General Comments VSS On RA    Pertinent Vitals/ Pain       Pain Assessment Pain Assessment: Faces Pain Score: 2  Faces Pain Scale: Hurts  a little bit Pain Location: chest Pain Descriptors / Indicators: Discomfort, Sore Pain Intervention(s): Limited activity within patient's tolerance, Monitored during session, Repositioned  Home Living                                          Prior Functioning/Environment              Frequency  Min 1X/week        Progress Toward Goals  OT Goals(current goals can now be found in the care plan section)  Progress towards OT goals: Progressing toward goals  Acute Rehab OT Goals Patient Stated Goal: none stated OT Goal Formulation: With patient Time For Goal Achievement: 09/18/22 Potential to Achieve Goals: Good ADL Goals Pt Will Perform Upper Body Dressing: with min assist;sitting Pt Will Perform Lower Body Dressing: with min assist;sitting/lateral leans;sit to/from stand Pt Will Transfer to Toilet: with min assist;ambulating;regular height toilet  Plan      Co-evaluation                 AM-PAC OT "6 Clicks" Daily Activity     Outcome Measure   Help from another person eating meals?: None Help from another person taking care of personal grooming?: A Little Help from another person toileting, which includes using toliet, bedpan, or urinal?: A Lot Help from another person bathing (including washing, rinsing, drying)?: A Lot Help from another person to put on and taking off regular upper body clothing?: A Lot Help from another person to put on and taking off regular lower body clothing?: A Lot 6 Click Score: 15    End of Session Equipment Utilized During Treatment: Gait belt;Rolling walker (2 wheels)  OT Visit Diagnosis: Unsteadiness on feet (R26.81);Other abnormalities of gait and mobility (R26.89);Muscle weakness (generalized) (M62.81)   Activity Tolerance Patient tolerated treatment well   Patient Left in bed;with call bell/phone within reach;with bed alarm set   Nurse Communication Mobility status        Time: 7253-6644 OT Time  Calculation (min): 31 min  Charges: OT General Charges $OT Visit: 1 Visit OT Treatments $Self Care/Home Management : 8-22 mins $Therapeutic Activity: 8-22 mins  Collin [name], OTD, OTR/L SecureChat Preferred Acute Rehab [phone]   Collin [name] 09/06/2022, 4:55 PM

## 2022-09-06 NOTE — Progress Notes (Addendum)
      [address]       [place] [address]             [phone]      5 Days Post-Op Procedure(s) (LRB): CORONARY ARTERY BYPASS GRAFTING (CABG) times three using left internal mammary artery and right saphenous vein. (N/A) TRANSESOPHAGEAL ECHOCARDIOGRAM (N/A)  Subjective:  Patient continues to have some soreness.  He was able to move his bowels yesterday, but it go to the point they wouldn't stop.  Working with PT/OT.  Objective: Vital signs in last 24 hours: Temp:  [97.7 F (36.5 C)-98.3 F (36.8 C)] 97.7 F (36.5 C) (08/12 0300) Pulse Rate:  [58-96] 62 (08/12 0500) Cardiac Rhythm: Normal sinus rhythm (08/11 2000) Resp:  [15-37] 16 (08/12 0500) BP: (100-142)/(54-98) 131/61 (08/12 0500) SpO2:  [80 %-99 %] 99 % (08/12 0500) Weight:  [89.9 kg] 89.9 kg (08/12 0321)  Intake/Output from previous day: 08/11 0701 - 08/12 0700 In: 586.2 [P.O.:480; I.V.:106.2] Out: 2200 [Urine:2200]  General appearance: alert, cooperative, and no distress Heart: regular rate and rhythm Lungs: diminished breath sounds bibasilar Abdomen: soft, non-tender; bowel sounds normal; no masses,  no organomegaly Extremities: edema + pitting Wound: clean and dry  Lab Results: Recent Labs    09/04/22 0158 09/05/22 0159  WBC 9.4 8.9  HGB 10.0* 9.7*  HCT 30.3* 29.5*  PLT 109* 142*   BMET:  Recent Labs    09/04/22 0158 09/05/22 0159  NA 130* 131*  K 3.9 3.9  CL 97* 97*  CO2 22 25  GLUCOSE 125* 128*  BUN 20 21  CREATININE 1.35* 1.28*  CALCIUM 8.4* 8.5*    PT/INR: No results for input(s): "LABPROT", "INR" in the last 72 hours. ABG    Component Value Date/Time   PHART 7.355 09/02/2022 0619   HCO3 19.4 (L) 09/02/2022 0619   TCO2 20 (L) 09/02/2022 0619   ACIDBASEDEF 6.0 (H) 09/02/2022 0619   O2SAT 98 09/02/2022 0619   CBG (last 3)  Recent Labs    09/05/22 0607 09/05/22 1112 09/05/22 1616  GLUCAP 156* 152* 123*    Assessment/Plan: S/P Procedure(s)  (LRB): CORONARY ARTERY BYPASS GRAFTING (CABG) times three using left internal mammary artery and right saphenous vein. (N/A) TRANSESOPHAGEAL ECHOCARDIOGRAM (N/A)  CV- PAF, currently NSR on Amiodarone 400 mg BID and Lopressor 12.5 mg BID.Marland Kitchen HR is too low for further titration of BB Pulm- no acute issues, not requiring oxygen, atelectasis, continue IS Renal-creatinine remains stable, pitting edema on exam, will continue Lasix, potassium Deconditioning- mild, PT/OT evaluations complete, home health orders placed Dispo- patient stable, remains in NSR, continue diuretics, will transfer to 4E today   LOS: 5 days    [name], PA-C 09/06/2022  Agree with above Ok to go to floor Hold anticoagulation for now

## 2022-09-07 NOTE — Care Management Important Message (Signed)
Important Message  Patient Details  Name: Collin [name] MRN: [id number] Date of Birth: Jan 16, 1941   Medicare Important Message Given:  Yes     Renie Ora 09/07/2022, 8:40 AM

## 2022-09-07 NOTE — Progress Notes (Signed)
Physical Therapy Treatment Patient Details Name: Collin [name] MRN: [id number] DOB: Aug 05, 1940 Today's Date: 09/07/2022   History of Present Illness Pt is 82 y.o. admitted 09/01/22 with STEMI. S/p emergent CABG x3 on 8/7. PMH includes CAD, MI, HTN, lumbar disc disease, microdiskectomy.   PT Comments  Today's session focused on bed mobility and transfer training while maintaining sternal precautions. Pt ambulatory with walker and CGA, but requires up to modA to get out of bed and  prevent posterior LOB with standing. Pt limited by generalized weakness, decreased activity tolerance, poor balance strategies/postural reactions and impaired cognition, including decreased memory and difficulty problem solving, which pt reports got worse after surgery. Pt at high risk for falls. Recommend intensive post-acute rehab services (>3 hrs/day) to maximize functional mobility and independence prior to return home. Will continue to follow acutely to address established goals.   If plan is discharge home, recommend the following: A lot of help with walking and/or transfers;A little help with bathing/dressing/bathroom;Assistance with cooking/housework;Direct supervision/assist for medications management;Direct supervision/assist for financial management;Assist for transportation;Help with stairs or ramp for entrance   Can travel by private vehicle      Yes  Equipment Recommendations   (TBD - maybe rollator)    Recommendations for Other Services       Precautions / Restrictions Precautions Precautions: Fall;Sternal Required Braces or Orthoses: Other Brace Other Brace: wears L foot brace for h/o foot drop     Mobility  Bed Mobility Overal bed mobility: Needs Assistance Bed Mobility: Rolling, Sidelying to Sit, Sit to Sidelying Rolling: Mod assist, Min assist, Supervision Sidelying to sit: Min assist, Supervision     Sit to sidelying: Supervision General bed mobility comments: practiced log roll and  sidelying<>sit x3 with bed flat, initial use of bed rail progressing to no bed rail use on final trial; initial modA for trunk rotation rolling to sidelying, minA for trunk elevation, progressing to supervision on subsequent trial though requiring repeated cues for sequencing and precautions    Transfers Overall transfer level: Needs assistance Equipment used: Rolling walker (2 wheels) Transfers: Sit to/from Stand, Bed to chair/wheelchair/BSC Sit to Stand: Mod assist, Min assist   Step pivot transfers: Contact guard assist       General transfer comment: multiple sit<>stands from EOB (starting with elevated bed height, decreasing height on subsequent trials) and recliner to RW; repeated cues for correct hand placement (hands next to or on knees, not grabbing walker or pushing on armrests). pt reliant on momentum to power up, once buttocks offloaded, but relies heavily on BLE extension against bed/recliner frame to achieve upright posture, frequent resulting in posterior LOB to uncontrolled sit requiring min-modA to prevent LOB back    Ambulation/Gait             Pre-gait activities: pivotal steps from bed>recliner with RW and CGA; ambulation deferred, session focused on bed mobility and transfer training     Stairs             Wheelchair Mobility     Tilt Bed    Modified Rankin (Stroke Patients Only)       Balance Overall balance assessment: Needs assistance Sitting-balance support: Feet supported Sitting balance-Leahy Scale: Good Sitting balance - Comments: prolonged sitting EOB; pt able to doff socks and don socks/shoes via figure four technique sitting at edge of recliner   Standing balance support: Bilateral upper extremity supported, Reliant on assistive device for balance Standing balance-Leahy Scale: Poor Standing balance comment: reliant on UE support  Cognition Arousal: Alert Behavior During Therapy: WFL for tasks  assessed/performed Overall Cognitive Status: Impaired/Different from baseline Area of Impairment: Following commands, Memory, Problem solving                     Memory: Decreased recall of precautions, Decreased short-term memory Following Commands: Follows multi-step commands with increased time     Problem Solving: Difficulty sequencing General Comments: pt pleasant and motivated. apparent memory deficits and difficulty sequencing at times, requires increased time and intermittent cues for mobility tasks. pt reports issues with cognition since surgery, understandably frustrated that he was able to move well and think clearly before this admission ("I was just doing squats with a kettlebell last week")        Exercises      General Comments General comments (skin integrity, edema, etc.): discussed POC and discharge recommendations, including updated recs to AIR; pt in agreement if an option      Pertinent Vitals/Pain Pain Assessment Pain Assessment: Faces Faces Pain Scale: Hurts a little bit Pain Location: bilateral shoulders Pain Descriptors / Indicators: Discomfort, Sore Pain Intervention(s): Monitored during session, Limited activity within patient's tolerance    Home Living                          Prior Function            PT Goals (current goals can now be found in the care plan section) Progress towards PT goals: Progressing toward goals    Frequency    Min 1X/week      PT Plan      Co-evaluation              AM-PAC PT "6 Clicks" Mobility   Outcome Measure  Help needed turning from your back to your side while in a flat bed without using bedrails?: A Little Help needed moving from lying on your back to sitting on the side of a flat bed without using bedrails?: A Little Help needed moving to and from a bed to a chair (including a wheelchair)?: A Little Help needed standing up from a chair using your arms (e.g., wheelchair or  bedside chair)?: A Lot Help needed to walk in hospital room?: A Little Help needed climbing 3-5 steps with a railing? : Total 6 Click Score: 15    End of Session Equipment Utilized During Treatment: Gait belt Activity Tolerance: Patient tolerated treatment well Patient left: in chair;with call bell/phone within reach;with chair alarm set Nurse Communication: Mobility status PT Visit Diagnosis: Unsteadiness on feet (R26.81);Other abnormalities of gait and mobility (R26.89);Muscle weakness (generalized) (M62.81)     Time: 1610-9604 PT Time Calculation (min) (ACUTE ONLY): 55 min  Charges:    $Therapeutic Activity: 38-52 mins $Neuromuscular Re-education: 8-22 mins PT General Charges $$ ACUTE PT VISIT: 1 Visit                      Ina Homes, PT, DPT Acute Rehabilitation Services  Personal: Secure Chat Rehab Office: [phone]  Malachy Chamber 09/07/2022, 11:57 AM

## 2022-09-07 NOTE — Progress Notes (Addendum)
Mobility Specialist Progress Note:   09/07/22 1300  Mobility  Activity Ambulated with assistance in hallway  Level of Assistance Moderate assist, patient does 50-74%  Assistive Device Front wheel walker  Distance Ambulated (ft) 50 ft  RLE Weight Bearing NWB  LLE Weight Bearing NWB  Activity Response Tolerated well  Mobility Referral Yes  $Mobility charge 1 Mobility  Mobility Specialist Start Time (ACUTE ONLY) 1215  Mobility Specialist Stop Time (ACUTE ONLY) 1300  Mobility Specialist Time Calculation (min) (ACUTE ONLY) 45 min   During Mobility: 72 HR  Post Mobility: 68 HR   Pt received in chair, requesting to ambulate. Upon request of OT practiced standing with pt to keep balance. Pt was able to safely STS x2 without breaking sternal precautions. D/t sternal precautions pt leans back upon standing causing ModA for correction. During ambulation pt c/o legs feelings tired and requiring verbal cues to stay within RW. Returning to room pt requesting to use BR. Void successful. Assisted with pericare. After put in chair pt requesting to go back to bed. 2x attempts required to stand from chair d/t leg weakness. Pt left in bed with call bell in reach and all needs met.  Leory Plowman  Mobility Specialist Please contact via [employer] office at [phone]

## 2022-09-07 NOTE — Plan of Care (Signed)
  Problem: Health Behavior/Discharge Planning: Goal: Ability to manage health-related needs will improve Outcome: Progressing   

## 2022-09-07 NOTE — Progress Notes (Signed)
Patient x 2 assist to stand for weight this AM. Patient having some trouble following commands. Maybe because [facility] and does not have hearing aids with him??   Patient very weak. Placed no skid socks on patient. Patient had regular socks on and was sliding on the floor in attempt to get on scale. Patient now has the proper socks on.   Patient will possibly benefit from CIR or SNF to get stronger.  Will continue to monitor

## 2022-09-07 NOTE — Progress Notes (Signed)
CARDIAC REHAB PHASE I   Pt worked with PT today and just completed short walk with mobility team. CIR workup in progress. Will continue to follow for assistance with mobility and education prior to discharge.     Woodroe Chen, RN BSN 09/07/2022 1:35 PM

## 2022-09-07 NOTE — Progress Notes (Signed)
Occupational Therapy Treatment Patient Details Name: Collin [name] MRN: [id number] DOB: 1940/03/05 Today's Date: 09/07/2022   History of present illness Pt is 82 y.o. admitted 09/01/22 with STEMI. S/p emergent CABG x3 on 8/7. PMH includes CAD, MI, HTN, lumbar disc disease, microdiskectomy.   OT comments  PTA pt was having "balance issues" which he had followed with neurology to address. Currently requires mod A with with ADL and is unable to stand without physical assist due to not being able to push through his arms due to sternal precautions. Pt is HOH and required frequent repetition of information. At times pt was very slow processing. Wife reports increased confusion at times since hospitalization. Given deficits with mobility and high fall risk, with decline in functional status, feel Patient will benefit from intensive inpatient follow up therapy, >3 hours/day. Acute OT to continue to follow.       If plan is discharge home, recommend the following:  A lot of help with walking and/or transfers;A lot of help with bathing/dressing/bathroom;Direct supervision/assist for medications management;Direct supervision/assist for financial management;Assistance with cooking/housework;Assist for transportation;Help with stairs or ramp for entrance   Equipment Recommendations  BSC/3in1    Recommendations for Other Services Rehab consult    Precautions / Restrictions Precautions Precautions: Fall;Sternal Required Braces or Orthoses: Other Brace Other Brace: wears L foot brace for h/o foot drop Restrictions RLE Weight Bearing: Non weight bearing LLE Weight Bearing: Non weight bearing Other Position/Activity Restrictions: sternal precautions       Mobility Bed Mobility               General bed mobility comments: OOB in chair    Transfers       Sit to Stand: Mod assist           General transfer comment: unable to stand from chair without using arms; requires physical  assist; falls posteriorly     Balance     Sitting balance-Leahy Scale: Good       Standing balance-Leahy Scale: Poor                             ADL either performed or assessed with clinical judgement   ADL       Grooming: Set up;Supervision/safety;Sitting   Upper Body Bathing: Minimal assistance   Lower Body Bathing: Minimal assistance;Sit to/from stand   Upper Body Dressing : Minimal assistance       Toilet Transfer: Moderate assistance   Toileting- Clothing Manipulation and Hygiene: Maximal assistance       Functional mobility during ADLs: Moderate assistance General ADL Comments: mod A to stand    Extremity/Trunk Assessment Upper Extremity Assessment Upper Extremity Assessment: Generalized weakness (limited by sternal precautions, otherwise funcitonal)            Vision       Perception     Praxis      Cognition Arousal: Alert Behavior During Therapy: WFL for tasks assessed/performed Overall Cognitive Status: Impaired/Different from baseline                       Memory: Decreased recall of precautions, Decreased short-term memory   Safety/Judgement: Decreased awareness of safety, Decreased awareness of deficits   Problem Solving: Slow processing General Comments: decreased insight/awareness of deficits and how they affect him functionally        Exercises Other Exercises Other Exercises: sit - stand from chair x 10 with  focus on antrior weight shift    Shoulder Instructions       General Comments discussed POC and discharge recommendations, including updated recs to AIR; pt in agreement if an option    Pertinent Vitals/ Pain       Pain Assessment Pain Assessment: Faces Faces Pain Scale: Hurts a little bit Pain Location: bilateral shoulders Pain Descriptors / Indicators: Discomfort, Sore Pain Intervention(s): Limited activity within patient's tolerance  Home Living                                           Prior Functioning/Environment              Frequency  Min 1X/week        Progress Toward Goals  OT Goals(current goals can now be found in the care plan section)  Progress towards OT goals: Progressing toward goals  Acute Rehab OT Goals Patient Stated Goal: to go home OT Goal Formulation: With patient Time For Goal Achievement: 09/18/22 Potential to Achieve Goals: Good ADL Goals Pt Will Perform Upper Body Dressing: with min assist;sitting Pt Will Perform Lower Body Dressing: with min assist;sitting/lateral leans;sit to/from stand Pt Will Transfer to Toilet: with min assist;ambulating;regular height toilet  Plan      Co-evaluation                 AM-PAC OT "6 Clicks" Daily Activity     Outcome Measure   Help from another person eating meals?: None Help from another person taking care of personal grooming?: A Little Help from another person toileting, which includes using toliet, bedpan, or urinal?: A Lot Help from another person bathing (including washing, rinsing, drying)?: A Lot Help from another person to put on and taking off regular upper body clothing?: A Little Help from another person to put on and taking off regular lower body clothing?: A Lot 6 Click Score: 16    End of Session Equipment Utilized During Treatment: Rolling walker (2 wheels);Gait belt  OT Visit Diagnosis: Unsteadiness on feet (R26.81);Other abnormalities of gait and mobility (R26.89);Muscle weakness (generalized) (M62.81)   Activity Tolerance Patient tolerated treatment well   Patient Left in chair;with call bell/phone within reach;with chair alarm set   Nurse Communication Mobility status        Time:  -     Charges:    Luisa Dago, OT/L   Acute OT Clinical Specialist Acute Rehabilitation Services Pager [phone] Office [phone]   [facility] 09/07/2022, 1:30 PM

## 2022-09-07 NOTE — Progress Notes (Signed)
[place] PHYSICAL MEDICINE AND REHABILITATION  CONSULT SERVICE NOTE    Chart reviewed. Pt discussed with admissions team. This is an 82 yo  male with a history of left foot drop (likely due to severe bilateral L3-4 and left L5-S1 foraminal stenosis/radiculopathy) who was working out at his gym on 09/01/22 when he developed chest tightness. He was found to have multivessel CAD and ultimately underwent CABG x 3 the same day by Dr. Leafy Ro. Course complicated by A-fib, treated with amiodarone. Most recently with PT this morning, patient required min to mod assist for sit-std transfers. PT notes posterior loss of balance d/t generalized weakness and impaired memory/problem solving. Pt has excellent dispo and should be able to achieve supervision goals while on inpatient rehab. It would also be an ideal time to assess foot drop and potential AFO to stabilize balance and improve gait quality. Rehab Admissions Coordinator to follow up.   Ranelle Oyster, MD, [facility] Physical Medicine & Rehabilitation Medical Director Rehabilitation Services 09/07/2022

## 2022-09-07 NOTE — Progress Notes (Addendum)
      [address]       [place] [address]             [phone]        6 Days Post-Op Procedure(s) (LRB): CORONARY ARTERY BYPASS GRAFTING (CABG) times three using left internal mammary artery and right saphenous vein. (N/A) TRANSESOPHAGEAL ECHOCARDIOGRAM (N/A)  Subjective: Patient with soft bowel movement late last night (stool softeners already stopped). He is still having trouble getting out of bed (requires multiple people to assist)  Objective: Vital signs in last 24 hours: Temp:  [97.5 F (36.4 C)-98.5 F (36.9 C)] 98.1 F (36.7 C) (08/13 0411) Pulse Rate:  [69-88] 81 (08/12 1615) Cardiac Rhythm: Heart block (08/12 1934) Resp:  [16-27] 18 (08/13 0411) BP: (108-147)/(58-79) 147/79 (08/13 0411) SpO2:  [93 %-97 %] 94 % (08/13 0411) Weight:  [86.7 kg-89.9 kg] 86.7 kg (08/13 0604)  Pre op weight 89 kg Current Weight  09/07/22 86.7 kg      Intake/Output from previous day: 08/12 0701 - 08/13 0700 In: 0  Out: 1925 [Urine:1925]   Physical Exam:  Cardiovascular: RRR Pulmonary: Clear to auscultation bilaterally. Abdomen: Soft, non tender, bowel sounds present. Extremities: No lower extremity edema. Wounds: Clean and dry.  No erythema or signs of infection.  Lab Results: CBC: Recent Labs    09/05/22 0159 09/07/22 0133  WBC 8.9 8.3  HGB 9.7* 9.4*  HCT 29.5* 28.5*  PLT 142* 205   BMET:  Recent Labs    09/06/22 1048 09/07/22 0133  NA 133* 134*  K 4.1 3.8  CL 95* 100  CO2 24 25  GLUCOSE 159* 116*  BUN 18 19  CREATININE 1.44* 1.36*  CALCIUM 8.8* 8.2*    PT/INR:  Lab Results  Component Value Date   INR 1.5 (H) 09/01/2022   INR 1.1 09/01/2022   INR 1.03 01/09/2018   ABG:  INR: Will add last result for INR, ABG once components are confirmed Will add last 4 CBG results once components are confirmed  Assessment/Plan:  1. CV - S/p STEMI. SR, first degree heart block. On Lopressor 12.5 mg bid, Amiodarone 400 mg tid, Plavix 75  mg daily. 2.  Pulmonary - On room air. Encourage incentive spirometer 3. Volume Overload - On Lasix 40 mg daily. Likely will not need Lasix at discharge (no LE edema, at or below pre op weight). 4.  Expected post op acute blood loss anemia - H and H this am stable at 9.4 and 28.5 5. Mild thrombocytopenia resolved-platelets this am up to 205,000 6. Supplement potassium 7. Creatinine this am slightly decreased to 1.36. Creatinine prior to surgery 1.23. 8. History of BPH-continue Flomax 9. Deconditioned-PT/OT. Per nursing, patient is a 2 person assist to get out of bed. May need to consider CIR/SNF. Patient states he is agreeable but and will discuss with daughter/wife  Lelon Huh ZimmermanPA-C 6:57 AM

## 2022-09-07 NOTE — Progress Notes (Signed)
  Inpatient Rehabilitation Admissions Coordinator   Met with patient, spouse and daughter at bedside for rehab assessment. We discussed goals and expectations of a possible CIR admit. Prior to admit, patient was to see OP someone for his left foot drop and balance issues. Now with CABG, needs to be more functionally mobile before d/c home with wife who can provide 2/7 assist. Patient very independent prior to admit. They prefer CIR for rehab.  I will begin insurance Auth with [employer] for possible CIR admit pending approval. OT to give updated assessment today. Please call me with any questions.   Ottie Glazier, RN, MSN Rehab Admissions Coordinator [phone]

## 2022-09-08 NOTE — Progress Notes (Signed)
Inpatient Rehabilitation Admissions Coordinator   I have insurance approval for CIR admit. I am hopeful for a bed to be available in the next 24 to 48 hrs. I met with patient and his wife at bedside and they are aware.  Ottie Glazier, RN, MSN Rehab Admissions Coordinator [phone] 09/08/2022 5:00 PM

## 2022-09-08 NOTE — Progress Notes (Signed)
  Inpatient Rehabilitation Admissions Coordinator  I met at bedside with patient and his daughter, Vernona Rieger. I await insurance determination for a possible Cir admit.   Ottie Glazier, RN, MSN Rehab Admissions Coordinator [phone] 09/08/2022 12:04 PM

## 2022-09-08 NOTE — Progress Notes (Signed)
Mobility Specialist Progress Note:   09/08/22 1241  Mobility  Activity Ambulated with assistance in hallway  Level of Assistance Minimal assist, patient does 75% or more  Assistive Device Front wheel walker  Distance Ambulated (ft) 225 ft  RUE Weight Bearing NWB  LUE Weight Bearing NWB  Activity Response Tolerated well  Mobility Referral Yes  $Mobility charge 1 Mobility  Mobility Specialist Start Time (ACUTE ONLY) 1204  Mobility Specialist Stop Time (ACUTE ONLY) 1230  Mobility Specialist Time Calculation (min) (ACUTE ONLY) 26 min    Post Mobility: 63 HR  Pt received in chair, agreeable to mobility. Pt able to stand without LOB with MinA. Standing rest break required during ambulation d/t fatigue. Pt c/o LLE feeling slightly numb during session. When returning to room daughter suggesting pt to use BR. Void unsuccessful. Pt left in bed with call bell in hand and all needs met.  Leory Plowman  Mobility Specialist Please contact via [employer] office at [phone]

## 2022-09-08 NOTE — PMR Pre-admission (Signed)
PMR Admission Coordinator Pre-Admission Assessment  Patient: Collin [name] is an 82 y.o., male MRN: [id number] DOB: 1940/09/15 Height: 6\' 1"  (185.4 cm) Weight: 88.2 kg  Insurance Information HMO:     PPO: yes     PCP:      IPA:      80/20:      OTHER:  PRIMARY: Health Team advantage      Policy#: [id number]      Subscriber: pt CM Name: Tammy      Phone#: [phone]     Fax#: [phone]/EMR access Pre-Cert#: [id number] approved for 7 days 8/15 until 8/22 /They have EMR access      Employer:  Benefits:  Phone #: [phone]     Name: 8/14 Eff. Date: 01/25/22     Deduct: none      Out of Pocket Max: $3200      Life Max: none CIR: $295 co pay per day days 1 until 6      SNF: no co pay per day days 1 until 20; $203 co pay per day days 21 until 100 Outpatient: $15 per visits     Co-Pay:  Home Health: 100%      Co-Pay:  DME: 80%     Co-Pay: 20% Providers: in network  SECONDARY: none  Financial Counselor:       Phone#:   The Data processing manager" for patients in Inpatient Rehabilitation Facilities with attached "Privacy Act Statement-Health Care Records" was provided and verbally reviewed with: Patient and Family  Emergency Contact Information Contact Information     Name Relation Home Work Mobile   Ukiah Spouse [phone]  [phone]      Other Contacts     Name Relation Home Work Mobile   Fl[place] Daughter   [phone]      Current Medical History  Patient Admitting Diagnosis: Debility s/p CABG  History of Present Illness: 82 year old male with history of CAD, HTN, HLD, BPH, CKD stage IIIa, and lumbar disc disease. Patient was working out at [employer] and developed acute onset chest tightness. Drove himself home and then called EMS. Presented on 09/01/22 . Patient took ASA at home prior to arrival of EMS.  EKG showed inferior STEMI, underwent cardicac cath which showed severe left main disease and ope RCA, IABP was placed. CT suregery consulted  fo revaluation of CABG. Patietn underwent emergency CABG x 3 on 8/7. Postoperative with atrial fib with controlled rate and given amioadarone. Began metoprolol. CHest tubes removed and succefully extubated. Atorvastatin for HLD. Hgb A1c 6. Continue SSI. MOnitor blood loss anemia. Avoid nephrotocis, and monitor serum creatinined. COmplaints of contsitpation. COnitnue dulcolax, snokot and Miralax. Lovenox for prophlaxis. Diuresis postop.  Patient's medical record from [facility] has been reviewed by the rehabilitation admission coordinator and physician.  Past Medical History  Past Medical History:  Diagnosis Date   Allergy    Arthritis    BPH (benign prostatic hyperplasia)    CAD (coronary artery disease)    Cataract    Diverticulosis of colon    Hx of adenomatous colonic polyps 05/22/2014   Hyperlipidemia    Hypertension    MYOCARDIAL INFARCTION, HX OF 07/20/2006   Qualifier: Diagnosis of  By: Cato Mulligan MD, Bruce     Has the patient had major surgery during 100 days prior to admission? Yes  Family History   family history includes AAA (abdominal aortic aneurysm) in his father and paternal uncle; Breast cancer in his daughter; Dementia (age of  onset: 49) in his mother; Diabetes in his brother; Heart disease in his father and mother; Hypertension in his sister; Kidney disease in his father; Stroke in his father.  Current Medications  Current Facility-Administered Medications:    amiodarone (PACERONE) tablet 400 mg, 400 mg, Oral, BID, Doree Fudge M, PA-C, 400 mg at 09/09/22 8295   aspirin EC tablet 81 mg, 81 mg, Oral, Daily, 81 mg at 09/09/22 6213 **OR** aspirin chewable tablet 81 mg, 81 mg, Per Tube, Daily, Eugenio Hoes, MD   atorvastatin (LIPITOR) tablet 80 mg, 80 mg, Oral, Daily, Roddenberry, Myron G, PA-C, 80 mg at 09/09/22 0865   clopidogrel (PLAVIX) tablet 75 mg, 75 mg, Oral, Daily, Barrett, Erin R, PA-C, 75 mg at 09/09/22 7846   furosemide (LASIX) tablet 40 mg, 40 mg,  Oral, Daily, Barrett, Erin R, PA-C, 40 mg at 09/09/22 0828   melatonin tablet 3 mg, 3 mg, Oral, QHS, Barrett, Erin R, PA-C, 3 mg at 09/08/22 2226   metoprolol tartrate (LOPRESSOR) tablet 12.5 mg, 12.5 mg, Oral, BID, 12.5 mg at 09/09/22 0828 **OR** metoprolol tartrate (LOPRESSOR) 25 mg/10 mL oral suspension 12.5 mg, 12.5 mg, Per Tube, BID, Barrett, Erin R, PA-C   metoprolol tartrate (LOPRESSOR) injection 2.5-5 mg, 2.5-5 mg, Intravenous, Q2H PRN, Roddenberry, Myron G, PA-C   ondansetron (ZOFRAN) injection 4 mg, 4 mg, Intravenous, Q6H PRN, Roddenberry, Myron G, PA-C   Oral care mouth rinse, 15 mL, Mouth Rinse, PRN, Eugenio Hoes, MD   oxyCODONE (Oxy IR/ROXICODONE) immediate release tablet 5-10 mg, 5-10 mg, Oral, Q3H PRN, Roddenberry, Myron G, PA-C, 10 mg at 09/02/22 2105   pantoprazole (PROTONIX) EC tablet 40 mg, 40 mg, Oral, Daily, Roddenberry, Myron G, PA-C, 40 mg at 09/09/22 9629   potassium chloride SA (KLOR-CON M) CR tablet 20 mEq, 20 mEq, Oral, Daily, Barrett, Erin R, PA-C, 20 mEq at 09/09/22 0827   tamsulosin (FLOMAX) capsule 0.4 mg, 0.4 mg, Oral, QPC supper, Roddenberry, Myron G, PA-C, 0.4 mg at 09/08/22 1610   traMADol (ULTRAM) tablet 50 mg, 50 mg, Oral, Q4H PRN, Roddenberry, Myron G, PA-C, 50 mg at 09/08/22 2233   traZODone (DESYREL) tablet 25 mg, 25 mg, Oral, QHS, Eugenio Hoes, MD, 25 mg at 09/08/22 2226  Patients Current Diet:  Diet Order             Diet Carb Modified Fluid consistency: Thin; Room service appropriate? Yes  Diet effective now                  Precautions / Restrictions Precautions Precautions: Fall, Sternal Precaution Booklet Issued: Yes (comment) Precaution Comments: educated pt on sternal precautions Other Brace: wears L foot brace for h/o foot drop Restrictions Weight Bearing Restrictions: Yes RUE Weight Bearing: Weight bearing as tolerated RUE Partial Weight Bearing Percentage or Pounds: 5 LUE Weight Bearing: Weight bearing as tolerated LUE Partial  Weight Bearing Percentage or Pounds: 5 RLE Weight Bearing: Weight bearing as tolerated LLE Weight Bearing: Weight bearing as tolerated Other Position/Activity Restrictions: sternal precautions   Has the patient had 2 or more falls or a fall with injury in the past year? No  Prior Activity Level Community (5-7x/wk): Independent, active and driving  Prior Functional Level Self Care: Did the patient need help bathing, dressing, using the toilet or eating? Independent  Indoor Mobility: Did the patient need assistance with walking from room to room (with or without device)? Independent  Stairs: Did the patient need assistance with internal or external stairs (with or without device)? Independent  Functional Cognition: Did the patient need help planning regular tasks such as shopping or remembering to take medications? Independent  Patient Information Are you of Hispanic, Latino/a,or Spanish origin?: A. No, not of Hispanic, Latino/a, or Spanish origin What is your race?: A. White Do you need or want an interpreter to communicate with a doctor or health care staff?: 0. No  Patient's Response To:  Health Literacy and Transportation Is the patient able to respond to health literacy and transportation needs?: Yes Health Literacy - How often do you need to have someone help you when you read instructions, pamphlets, or other written material from your doctor or pharmacy?: Never In the past 12 months, has lack of transportation kept you from medical appointments or from getting medications?: No In the past 12 months, has lack of transportation kept you from meetings, work, or from getting things needed for daily living?: No  Journalist, newspaper / Equipment Home Equipment: Shower seat - built in  Prior Device Use: Indicate devices/aids used by the patient prior to current illness, exacerbation or injury? None of the above  Current Functional Level Cognition  Overall Cognitive Status:  Impaired/Different from baseline Orientation Level: Oriented X4 Following Commands: Follows multi-step commands with increased time Safety/Judgement: Decreased awareness of safety, Decreased awareness of deficits General Comments: decreased insight/awareness of deficits and how they affect him functionally    Extremity Assessment (includes Sensation/Coordination)  Upper Extremity Assessment: Generalized weakness (limited by sternal precautions, otherwise funcitonal)  Lower Extremity Assessment: Generalized weakness LLE Deficits / Details: Lt foot drop    ADLs  Overall ADL's : Needs assistance/impaired Eating/Feeding: Set up, Sitting Grooming: Set up, Supervision/safety, Sitting Grooming Details (indicate cue type and reason): standing at sink Upper Body Bathing: Minimal assistance Lower Body Bathing: Minimal assistance, Sit to/from stand Upper Body Dressing : Minimal assistance Lower Body Dressing: Maximal assistance, Sitting/lateral leans Lower Body Dressing Details (indicate cue type and reason): donning shoes Toilet Transfer: Moderate assistance Toileting- Clothing Manipulation and Hygiene: Maximal assistance Functional mobility during ADLs: Moderate assistance General ADL Comments: mod A to stand    Mobility  Overal bed mobility: Needs Assistance Bed Mobility: Rolling, Sidelying to Sit, Sit to Sidelying Rolling: Mod assist, Min assist, Supervision Sidelying to sit: Min assist, Supervision Sit to sidelying: Supervision General bed mobility comments: OOB in chair    Transfers  Overall transfer level: Needs assistance Equipment used: Rolling walker (2 wheels) Transfers: Sit to/from Stand, Bed to chair/wheelchair/BSC Sit to Stand: Mod assist Bed to/from chair/wheelchair/BSC transfer type:: Step pivot Step pivot transfers: Contact guard assist General transfer comment: unable to stand from chair without using arms; requires physical assist; falls posteriorly    Ambulation /  Gait / Stairs / Wheelchair Mobility  Ambulation/Gait Ambulation/Gait assistance: Contact guard assist Gait Distance (Feet): 220 Feet Assistive device: Rolling walker (2 wheels) Gait Pattern/deviations: Decreased stride length, Trunk flexed, Decreased dorsiflexion - left General Gait Details: cues throughout to keep head up as flexed posture and at times more anterior bias, wearing shoes already with foot up brace on L due to foot drop Gait velocity: decr Gait velocity interpretation: <1.8 ft/sec, indicate of risk for recurrent falls Pre-gait activities: pivotal steps from bed>recliner with RW and CGA; ambulation deferred, session focused on bed mobility and transfer training    Posture / Balance Dynamic Sitting Balance Sitting balance - Comments: prolonged sitting EOB; pt able to doff socks and don socks/shoes via figure four technique sitting at edge of recliner Balance Overall balance assessment: Needs assistance  Sitting-balance support: Feet supported Sitting balance-Leahy Scale: Good Sitting balance - Comments: prolonged sitting EOB; pt able to doff socks and don socks/shoes via figure four technique sitting at edge of recliner Standing balance support: Bilateral upper extremity supported, Reliant on assistive device for balance Standing balance-Leahy Scale: Poor Standing balance comment: reliant on UE support    Special needs/care consideration Left foot drop an balance issues prior to admit that he was to have worked up outpatient   Previous Home Environment  Living Arrangements: Spouse/significant other  Lives With: Spouse Available Help at Discharge: Family, Available 24 hours/day Type of Home: Other(Comment) Home Layout: Two level, Able to live on main level with bedroom/bathroom Alternate Level Stairs-Number of Steps: flight Home Access: Stairs to enter Entrance Stairs-Rails: Right Entrance Stairs-Number of Steps: 3 Bathroom Shower/Tub: Health visitor:  Standard Bathroom Accessibility: Yes How Accessible: Accessible via walker Home Care Services: No  Discharge Living Setting Plans for Discharge Living Setting: Patient's home, Lives with (comment) (wife) Type of Home at Discharge: House Discharge Home Layout: Two level, Able to live on main level with bedroom/bathroom Alternate Level Stairs-Number of Steps: Flight Discharge Home Access: Stairs to enter Entrance Stairs-Rails: Right Entrance Stairs-Number of Steps: 3 Discharge Bathroom Shower/Tub: Walk-in shower Discharge Bathroom Toilet: Standard Discharge Bathroom Accessibility: Yes How Accessible: Accessible via walker Does the patient have any problems obtaining your medications?: No  Social/Family/Support Systems Patient Roles: Spouse, Parent Contact Information: wife, Olegario Messier and daughter, Vernona Rieger Anticipated Caregiver: wife Anticipated Caregiver's Contact Information: see contacts Ability/Limitations of Caregiver: none Caregiver Availability: 24/7 Discharge Plan Discussed with Primary Caregiver: Yes Is Caregiver In Agreement with Plan?: Yes Does Caregiver/Family have Issues with Lodging/Transportation while Pt is in Rehab?: No  Goals Patient/Family Goal for Rehab: Mod I to supervision with PT and OT Expected length of stay: ELOS 7 to 10 days; patient wishes shorter LOS Pt/Family Agrees to Admission and willing to participate: Yes Program Orientation Provided & Reviewed with Pt/Caregiver Including Roles  & Responsibilities: Yes  Decrease burden of Care through IP rehab admission: n/a  Possible need for SNF placement upon discharge: not anticipated  Patient Condition: I have reviewed medical records from [facility], spoken with CM, and patient, spouse, and daughter. I met with patient at the bedside for inpatient rehabilitation assessment.  Patient will benefit from ongoing PT and OT, can actively participate in 3 hours of therapy a day 5 days of the week, and can make  measurable gains during the admission.  Patient will also benefit from the coordinated team approach during an Inpatient Acute Rehabilitation admission.  The patient will receive intensive therapy as well as Rehabilitation physician, nursing, social worker, and care management interventions.  Due to bladder management, bowel management, safety, skin/wound care, disease management, medication administration, pain management, and patient education the patient requires 24 hour a day rehabilitation nursing.  The patient is currently min assist overall with mobility and basic ADLs.  Discharge setting and therapy post discharge at home with outpatient is anticipated.  Patient has agreed to participate in the Acute Inpatient Rehabilitation Program and will admit today.  Preadmission Screen Completed By:  Clois Dupes, RN MSN 09/09/2022 11:19 AM ______________________________________________________________________   Discussed status with Dr. Berline Chough on 09/09/22 at 1119 and received approval for admission today.  Admission Coordinator:  Clois Dupes, RN MSN time 1119 Date 09/09/22   Assessment/Plan: Diagnosis: CABG x3 Does the need for close, 24 hr/day Medical supervision in concert with the patient's rehab needs make  it unreasonable for this patient to be served in a less intensive setting? Yes Co-Morbidities requiring supervision/potential complications: L foot drop- confusion/delirium; AKI; STEMI; sternal precautions Due to bladder management, bowel management, safety, skin/wound care, disease management, medication administration, pain management, and patient education, does the patient require 24 hr/day rehab nursing? Yes Does the patient require coordinated care of a physician, rehab nurse, PT, OT, and SLP to address physical and functional deficits in the context of the above medical diagnosis(es)? Yes Addressing deficits in the following areas: balance, endurance, locomotion,  strength, transferring, bowel/bladder control, bathing, dressing, feeding, grooming, toileting, and cognition Can the patient actively participate in an intensive therapy program of at least 3 hrs of therapy 5 days a week? Yes The potential for patient to make measurable gains while on inpatient rehab is good Anticipated functional outcomes upon discharge from inpatient rehab: modified independent PT, modified independent OT, n/a SLP Estimated rehab length of stay to reach the above functional goals is: 7-10 days Anticipated discharge destination: Home 10. Overall Rehab/Functional Prognosis: good   MD Signature:

## 2022-09-08 NOTE — Progress Notes (Signed)
Pt walked in hallway with front wheeled walker approximately 500 feet.  Tolerated ambulation well.

## 2022-09-08 NOTE — Consult Note (Signed)
[facility] Customer service manager [name] Accountable Care Organization (ACO) [facility] Liaison Note  09/08/2022  Collin [name] 04/19/1940 [id number]  Location: [facility] Liaison met patient at bedside at [facility].  Insurance: Health Team Advantage   Collin [name] is a 82 y.o. male who is a Primary Care Patient of [place], Timoteo Expose, MD. The patient was screened for readmission hospitalization with noted low risk score for unplanned readmission risk with 1 IP in 6 months.  The patient was assessed for potential [facility]) Care Management service needs for post hospital transition for care coordination. Review of patient's electronic medical record reveals patient was admitted for STEMI. Pt was sleeping and liaison spoke with son Collin [name]) concerning community care management services and benefits. Son indicates pt has a good support system with anticipated needs at this time. Son receptive if pt receives a post hospital prevention readmission follow up call. No request or further inquires at this time.   Plan: [facility] Liaison will continue to follow progress and disposition to asess for post hospital community care coordination/management needs.  Referral request for community care coordination: anticipate [facility] Transitions of Care Team follow up.   [facility] Care Management/Population Health does not replace or interfere with any arrangements made by the Inpatient Transition of Care team.   For questions contact:   Elliot Cousin, RN, [facility] Liaison [place]   Population Health Office Hours MTWF  8:00 am-6:00 pm Off on Thursday [phone] mobile [phone] [Office toll free line] Office Hours are M-F 8:30 - 5 pm [email or web address]

## 2022-09-08 NOTE — Progress Notes (Addendum)
      [address]       [place] [address]             [phone]      7 Days Post-Op Procedure(s) (LRB): CORONARY ARTERY BYPASS GRAFTING (CABG) times three using left internal mammary artery and right saphenous vein. (N/A) TRANSESOPHAGEAL ECHOCARDIOGRAM (N/A)  Subjective:  Patient has no new complaints.  States he has been unable to get out of bed.  He is hoping to be able to go to rehab upstairs.  His bowel movements have slowed down.  Objective: Vital signs in last 24 hours: Temp:  [97.9 F (36.6 C)-98.5 F (36.9 C)] 97.9 F (36.6 C) (08/14 0500) Pulse Rate:  [70-75] 75 (08/13 2004) Cardiac Rhythm: Heart block (08/13 0834) Resp:  [14-20] 20 (08/13 1608) BP: (111-142)/(62-89) 129/64 (08/14 0500) SpO2:  [95 %-100 %] 98 % (08/13 2317) Weight:  [86.3 kg] 86.3 kg (08/14 0500)  Intake/Output from previous day: 08/13 0701 - 08/14 0700 In: 0  Out: 2000 [Urine:2000]  General appearance: alert, cooperative, and no distress Heart: regular rate and rhythm Lungs: clear to auscultation bilaterally Abdomen: soft, non-tender; bowel sounds normal; no masses,  no organomegaly Extremities: edema trace Wound: clean and dry  Lab Results: Recent Labs    09/07/22 0133  WBC 8.3  HGB 9.4*  HCT 28.5*  PLT 205   BMET:  Recent Labs    09/06/22 1048 09/07/22 0133  NA 133* 134*  K 4.1 3.8  CL 95* 100  CO2 24 25  GLUCOSE 159* 116*  BUN 18 19  CREATININE 1.44* 1.36*  CALCIUM 8.8* 8.2*    PT/INR: No results for input(s): "LABPROT", "INR" in the last 72 hours. ABG    Component Value Date/Time   PHART 7.355 09/02/2022 0619   HCO3 19.4 (L) 09/02/2022 0619   TCO2 20 (L) 09/02/2022 0619   ACIDBASEDEF 6.0 (H) 09/02/2022 0619   O2SAT 98 09/02/2022 0619   CBG (last 3)  Recent Labs    09/05/22 1112 09/05/22 1616  GLUCAP 152* 123*    Assessment/Plan: S/P Procedure(s) (LRB): CORONARY ARTERY BYPASS GRAFTING (CABG) times three using left internal mammary  artery and right saphenous vein. (N/A) TRANSESOPHAGEAL ECHOCARDIOGRAM (N/A)  CV- PAF, maintaining NSR- continue Lopressor, Plavix, Amiodarone Pulm- off oxygen, continue IS Renal- creatinine stable, weight is at baseline, continue Lasix, potassium Expected post operative blood loss anemia, Hgb at 9.4 Deconditioning- patient requires 2 person assist to get up out of bed, hopefully CIR will be authorized by insurance Dispo- patient stable, for discharge to CIR once bed authorization is obtained   LOS: 7 days    [name], PA-C 09/08/2022  Agree with above Hopefully CIR today

## 2022-09-08 NOTE — Plan of Care (Signed)
  Problem: Health Behavior/Discharge Planning: Goal: Ability to manage health-related needs will improve Outcome: Progressing   Problem: Clinical Measurements: Goal: Will remain free from infection Outcome: Progressing   

## 2022-09-09 ENCOUNTER — Inpatient Hospital Stay (HOSPITAL_COMMUNITY)
Admission: RE | Admit: 2022-09-09 | Discharge: 2022-09-24 | DRG: 945 | Disposition: A | Discharge status: Home / Self Care | Payer: PPO | Source: Intra-hospital | Attending: Physical Medicine and Rehabilitation | Admitting: Physical Medicine and Rehabilitation

## 2022-09-09 DIAGNOSIS — N179 Acute kidney failure, unspecified: Secondary | ICD-10-CM | POA: Diagnosis not present

## 2022-09-09 DIAGNOSIS — R7303 Prediabetes: Secondary | ICD-10-CM | POA: Diagnosis present

## 2022-09-09 DIAGNOSIS — Z841 Family history of disorders of kidney and ureter: Secondary | ICD-10-CM

## 2022-09-09 DIAGNOSIS — H9190 Unspecified hearing loss, unspecified ear: Secondary | ICD-10-CM | POA: Diagnosis not present

## 2022-09-09 DIAGNOSIS — Z951 Presence of aortocoronary bypass graft: Secondary | ICD-10-CM | POA: Diagnosis not present

## 2022-09-09 DIAGNOSIS — I213 ST elevation (STEMI) myocardial infarction of unspecified site: Secondary | ICD-10-CM | POA: Diagnosis not present

## 2022-09-09 DIAGNOSIS — D62 Acute posthemorrhagic anemia: Secondary | ICD-10-CM | POA: Diagnosis not present

## 2022-09-09 DIAGNOSIS — I480 Paroxysmal atrial fibrillation: Secondary | ICD-10-CM | POA: Diagnosis not present

## 2022-09-09 DIAGNOSIS — I2101 ST elevation (STEMI) myocardial infarction involving left main coronary artery: Secondary | ICD-10-CM

## 2022-09-09 DIAGNOSIS — K5901 Slow transit constipation: Secondary | ICD-10-CM | POA: Diagnosis not present

## 2022-09-09 DIAGNOSIS — Z823 Family history of stroke: Secondary | ICD-10-CM | POA: Diagnosis not present

## 2022-09-09 DIAGNOSIS — E8770 Fluid overload, unspecified: Secondary | ICD-10-CM | POA: Diagnosis present

## 2022-09-09 DIAGNOSIS — M549 Dorsalgia, unspecified: Secondary | ICD-10-CM | POA: Diagnosis not present

## 2022-09-09 DIAGNOSIS — Z79899 Other long term (current) drug therapy: Secondary | ICD-10-CM

## 2022-09-09 DIAGNOSIS — K5900 Constipation, unspecified: Secondary | ICD-10-CM | POA: Diagnosis present

## 2022-09-09 DIAGNOSIS — Z8249 Family history of ischemic heart disease and other diseases of the circulatory system: Secondary | ICD-10-CM

## 2022-09-09 DIAGNOSIS — R5381 Other malaise: Principal | ICD-10-CM | POA: Diagnosis present

## 2022-09-09 DIAGNOSIS — I2119 ST elevation (STEMI) myocardial infarction involving other coronary artery of inferior wall: Secondary | ICD-10-CM | POA: Diagnosis present

## 2022-09-09 DIAGNOSIS — E785 Hyperlipidemia, unspecified: Secondary | ICD-10-CM | POA: Diagnosis present

## 2022-09-09 DIAGNOSIS — Z7982 Long term (current) use of aspirin: Secondary | ICD-10-CM

## 2022-09-09 DIAGNOSIS — N400 Enlarged prostate without lower urinary tract symptoms: Secondary | ICD-10-CM | POA: Diagnosis present

## 2022-09-09 DIAGNOSIS — I252 Old myocardial infarction: Secondary | ICD-10-CM | POA: Diagnosis not present

## 2022-09-09 DIAGNOSIS — I10 Essential (primary) hypertension: Secondary | ICD-10-CM | POA: Diagnosis present

## 2022-09-09 DIAGNOSIS — Z820 Family history of epilepsy and other diseases of the nervous system: Secondary | ICD-10-CM

## 2022-09-09 DIAGNOSIS — I2510 Atherosclerotic heart disease of native coronary artery without angina pectoris: Secondary | ICD-10-CM | POA: Diagnosis present

## 2022-09-09 DIAGNOSIS — Z7902 Long term (current) use of antithrombotics/antiplatelets: Secondary | ICD-10-CM

## 2022-09-09 DIAGNOSIS — I951 Orthostatic hypotension: Secondary | ICD-10-CM | POA: Diagnosis not present

## 2022-09-09 DIAGNOSIS — Z833 Family history of diabetes mellitus: Secondary | ICD-10-CM

## 2022-09-09 DIAGNOSIS — Z880 Allergy status to penicillin: Secondary | ICD-10-CM

## 2022-09-09 DIAGNOSIS — Z803 Family history of malignant neoplasm of breast: Secondary | ICD-10-CM

## 2022-09-09 DIAGNOSIS — G8929 Other chronic pain: Secondary | ICD-10-CM | POA: Diagnosis present

## 2022-09-09 DIAGNOSIS — G629 Polyneuropathy, unspecified: Secondary | ICD-10-CM | POA: Diagnosis not present

## 2022-09-09 DIAGNOSIS — Z981 Arthrodesis status: Secondary | ICD-10-CM

## 2022-09-09 LAB — BASIC METABOLIC PANEL
Anion gap: 13 (ref 5–15)
BUN: 23 mg/dL (ref 8–23)
CO2: 27 mmol/L (ref 22–32)
Calcium: 8.8 mg/dL — ABNORMAL LOW (ref 8.9–10.3)
Chloride: 95 mmol/L — ABNORMAL LOW (ref 98–111)
Creatinine, Ser: 1.6 mg/dL — ABNORMAL HIGH (ref 0.61–1.24)
GFR, Estimated: 42 mL/min — ABNORMAL LOW (ref >60)
Glucose, Bld: 129 mg/dL — ABNORMAL HIGH (ref 70–99)
Potassium: 4.2 mmol/L (ref 3.5–5.1)
Sodium: 135 mmol/L (ref 135–145)

## 2022-09-09 LAB — GLUCOSE, CAPILLARY
Glucose-Capillary: 100 mg/dL — ABNORMAL HIGH (ref 70–99)
Glucose-Capillary: 132 mg/dL — ABNORMAL HIGH (ref 70–99)

## 2022-09-09 MED ORDER — CLOPIDOGREL BISULFATE 75 MG PO TABS
75.0000 mg | ORAL_TABLET | Freq: Every day | ORAL | Status: DC
  Administered 2022-09-10 – 2022-09-24 (×15): 75 mg via ORAL
  Filled 2022-09-09 (×16): qty 1

## 2022-09-09 MED ORDER — AMIODARONE HCL 200 MG PO TABS
400.0000 mg | ORAL_TABLET | Freq: Two times a day (BID) | ORAL | Status: DC
  Administered 2022-09-09: 400 mg via ORAL
  Filled 2022-09-09: qty 2

## 2022-09-09 MED ORDER — ALUM & MAG HYDROXIDE-SIMETH 200-200-20 MG/5ML PO SUSP: 30.0000 mL | ORAL | Status: DC | PRN

## 2022-09-09 MED ORDER — AMIODARONE HCL 200 MG PO TABS: ORAL_TABLET | ORAL | Status: DC

## 2022-09-09 MED ORDER — FUROSEMIDE 40 MG PO TABS: 40.0000 mg | ORAL_TABLET | Freq: Every day | ORAL | Status: DC

## 2022-09-09 MED ORDER — LIDOCAINE HCL URETHRAL/MUCOSAL 2 % EX GEL: CUTANEOUS | Status: DC | PRN

## 2022-09-09 MED ORDER — FUROSEMIDE 40 MG PO TABS
40.0000 mg | ORAL_TABLET | Freq: Every day | ORAL | Status: DC
  Administered 2022-09-10 – 2022-09-14 (×5): 40 mg via ORAL
  Filled 2022-09-09 (×5): qty 1

## 2022-09-09 MED ORDER — GUAIFENESIN-DM 100-10 MG/5ML PO SYRP: 5.0000 mL | ORAL_SOLUTION | Freq: Four times a day (QID) | ORAL | Status: DC | PRN

## 2022-09-09 MED ORDER — BISACODYL 10 MG RE SUPP: 10.0000 mg | Freq: Every day | RECTAL | Status: DC | PRN

## 2022-09-09 MED ORDER — PROCHLORPERAZINE 25 MG RE SUPP: 12.5000 mg | Freq: Four times a day (QID) | RECTAL | Status: DC | PRN

## 2022-09-09 MED ORDER — SENNOSIDES-DOCUSATE SODIUM 8.6-50 MG PO TABS
2.0000 | ORAL_TABLET | Freq: Every day | ORAL | Status: DC
  Administered 2022-09-09 – 2022-09-11 (×3): 2 via ORAL
  Filled 2022-09-09 (×5): qty 2

## 2022-09-09 MED ORDER — ACETAMINOPHEN 325 MG PO TABS
325.0000 mg | ORAL_TABLET | ORAL | Status: DC | PRN
  Administered 2022-09-10: 650 mg via ORAL
  Filled 2022-09-09: qty 2

## 2022-09-09 MED ORDER — AMIODARONE HCL 200 MG PO TABS
200.0000 mg | ORAL_TABLET | Freq: Every day | ORAL | Status: AC
  Administered 2022-09-18 – 2022-09-24 (×7): 200 mg via ORAL
  Filled 2022-09-09 (×7): qty 1

## 2022-09-09 MED ORDER — INSULIN ASPART 100 UNIT/ML IJ SOLN: 0.0000 [IU] | Freq: Every day | INTRAMUSCULAR | Status: DC

## 2022-09-09 MED ORDER — TRAZODONE HCL 50 MG PO TABS
25.0000 mg | ORAL_TABLET | Freq: Every day | ORAL | Status: DC
  Administered 2022-09-09 – 2022-09-23 (×15): 25 mg via ORAL
  Filled 2022-09-09 (×15): qty 1

## 2022-09-09 MED ORDER — INSULIN ASPART 100 UNIT/ML IJ SOLN: 0.0000 [IU] | Freq: Three times a day (TID) | INTRAMUSCULAR | Status: DC

## 2022-09-09 MED ORDER — TRAZODONE HCL 50 MG PO TABS: 25.0000 mg | ORAL_TABLET | Freq: Every day | ORAL | Status: DC

## 2022-09-09 MED ORDER — AMIODARONE HCL 400 MG PO TABS: ORAL_TABLET | ORAL | Status: DC

## 2022-09-09 MED ORDER — OXYCODONE HCL 5 MG PO TABS: 5.0000 mg | ORAL_TABLET | ORAL | Status: DC | PRN

## 2022-09-09 MED ORDER — ORAL CARE MOUTH RINSE: 15.0000 mL | OROMUCOSAL | Status: DC | PRN

## 2022-09-09 MED ORDER — CLOPIDOGREL BISULFATE 75 MG PO TABS: 75.0000 mg | ORAL_TABLET | Freq: Every day | ORAL | Status: DC

## 2022-09-09 MED ORDER — METOPROLOL TARTRATE 12.5 MG HALF TABLET
12.5000 mg | ORAL_TABLET | Freq: Two times a day (BID) | ORAL | Status: DC
  Administered 2022-09-09 – 2022-09-13 (×8): 12.5 mg via ORAL
  Filled 2022-09-09 (×8): qty 1

## 2022-09-09 MED ORDER — TRAMADOL HCL 50 MG PO TABS: 50.0000 mg | ORAL_TABLET | Freq: Four times a day (QID) | ORAL | Status: DC | PRN

## 2022-09-09 MED ORDER — ASPIRIN 81 MG PO TBEC
81.0000 mg | DELAYED_RELEASE_TABLET | Freq: Every day | ORAL | Status: DC
  Administered 2022-09-10 – 2022-09-24 (×15): 81 mg via ORAL
  Filled 2022-09-09 (×15): qty 1

## 2022-09-09 MED ORDER — POTASSIUM CHLORIDE CRYS ER 20 MEQ PO TBCR: 20.0000 meq | EXTENDED_RELEASE_TABLET | Freq: Every day | ORAL | Status: DC

## 2022-09-09 MED ORDER — DIPHENHYDRAMINE HCL 25 MG PO CAPS: 25.0000 mg | ORAL_CAPSULE | Freq: Four times a day (QID) | ORAL | Status: DC | PRN

## 2022-09-09 MED ORDER — FLEET ENEMA RE ENEM: 1.0000 | ENEMA | Freq: Once | RECTAL | Status: DC | PRN

## 2022-09-09 MED ORDER — VITAMIN C 500 MG PO TABS
250.0000 mg | ORAL_TABLET | Freq: Every day | ORAL | Status: DC
  Administered 2022-09-09 – 2022-09-24 (×16): 250 mg via ORAL
  Filled 2022-09-09 (×17): qty 1

## 2022-09-09 MED ORDER — AMIODARONE HCL 200 MG PO TABS
400.0000 mg | ORAL_TABLET | Freq: Two times a day (BID) | ORAL | Status: AC
  Administered 2022-09-09 – 2022-09-10 (×3): 400 mg via ORAL
  Filled 2022-09-09 (×3): qty 2

## 2022-09-09 MED ORDER — METOPROLOL TARTRATE 25 MG/10 ML ORAL SUSPENSION: 12.5000 mg | Freq: Two times a day (BID) | ORAL | Status: DC

## 2022-09-09 MED ORDER — TRAZODONE HCL 50 MG PO TABS: 25.0000 mg | ORAL_TABLET | Freq: Every evening | ORAL | Status: DC | PRN

## 2022-09-09 MED ORDER — TRAMADOL HCL 50 MG PO TABS: 50.0000 mg | ORAL_TABLET | ORAL | Status: DC | PRN

## 2022-09-09 MED ORDER — ENOXAPARIN SODIUM 40 MG/0.4ML IJ SOSY
40.0000 mg | PREFILLED_SYRINGE | INTRAMUSCULAR | Status: DC
  Administered 2022-09-09 – 2022-09-22 (×14): 40 mg via SUBCUTANEOUS
  Filled 2022-09-09 (×15): qty 0.4

## 2022-09-09 MED ORDER — TAMSULOSIN HCL 0.4 MG PO CAPS
0.4000 mg | ORAL_CAPSULE | Freq: Every day | ORAL | Status: DC
  Administered 2022-09-09 – 2022-09-23 (×15): 0.4 mg via ORAL
  Filled 2022-09-09 (×15): qty 1

## 2022-09-09 MED ORDER — PROCHLORPERAZINE EDISYLATE 10 MG/2ML IJ SOLN: 5.0000 mg | Freq: Four times a day (QID) | INTRAMUSCULAR | Status: DC | PRN

## 2022-09-09 MED ORDER — ASPIRIN 81 MG PO CHEW: 81.0000 mg | CHEWABLE_TABLET | Freq: Every day | ORAL | Status: DC

## 2022-09-09 MED ORDER — POTASSIUM CHLORIDE CRYS ER 20 MEQ PO TBCR
20.0000 meq | EXTENDED_RELEASE_TABLET | Freq: Every day | ORAL | Status: DC
  Administered 2022-09-10 – 2022-09-24 (×15): 20 meq via ORAL
  Filled 2022-09-09 (×15): qty 1

## 2022-09-09 MED ORDER — AMIODARONE HCL 200 MG PO TABS
200.0000 mg | ORAL_TABLET | Freq: Two times a day (BID) | ORAL | Status: AC
  Administered 2022-09-11 – 2022-09-17 (×14): 200 mg via ORAL
  Filled 2022-09-09 (×14): qty 1

## 2022-09-09 MED ORDER — PROCHLORPERAZINE MALEATE 5 MG PO TABS: 5.0000 mg | ORAL_TABLET | Freq: Four times a day (QID) | ORAL | Status: DC | PRN

## 2022-09-09 MED ORDER — PANTOPRAZOLE SODIUM 40 MG PO TBEC
40.0000 mg | DELAYED_RELEASE_TABLET | Freq: Every day | ORAL | Status: DC
  Administered 2022-09-10 – 2022-09-24 (×15): 40 mg via ORAL
  Filled 2022-09-09 (×15): qty 1

## 2022-09-09 MED ORDER — ATORVASTATIN CALCIUM 80 MG PO TABS
80.0000 mg | ORAL_TABLET | Freq: Every day | ORAL | Status: DC
  Administered 2022-09-10 – 2022-09-24 (×15): 80 mg via ORAL
  Filled 2022-09-09 (×15): qty 1

## 2022-09-09 MED ORDER — MELATONIN 3 MG PO TABS
3.0000 mg | ORAL_TABLET | Freq: Every day | ORAL | Status: DC
  Administered 2022-09-09 – 2022-09-23 (×15): 3 mg via ORAL
  Filled 2022-09-09 (×15): qty 1

## 2022-09-09 NOTE — Progress Notes (Signed)
  Inpatient Rehabilitation Admissions Coordinator    CIR bed is available to admit him to today. I have alerted acute team and TOC. I will make the arrangements.  Ottie Glazier, RN, MSN Rehab Admissions Coordinator [phone] 09/09/2022 10:26 AM

## 2022-09-09 NOTE — Progress Notes (Signed)
    Ranelle Oyster, MD Physician Physical Medicine and Rehabilitation   Progress Notes    Signed   Date of Service: 09/07/2022  2:11 PM  Related encounter: ED to Hosp-Admission (Discharged) from 09/01/2022 in [facility] 4E CV SURGICAL PROGRESSIVE CARE   Signed      Show:Clear all [x] Written[x] Templated[] Copied  Added by: [x] Ranelle Oyster, MD  [] Hover for details [place] PHYSICAL MEDICINE AND REHABILITATION  CONSULT SERVICE NOTE     Chart reviewed. Pt discussed with admissions team. This is an 82 yo  male with a history of left foot drop (likely due to severe bilateral L3-4 and left L5-S1 foraminal stenosis/radiculopathy) who was working out at his gym on 09/01/22 when he developed chest tightness. He was found to have multivessel CAD and ultimately underwent CABG x 3 the same day by Dr. Leafy Ro. Course complicated by A-fib, treated with amiodarone. Most recently with PT this morning, patient required min to mod assist for sit-std transfers. PT notes posterior loss of balance d/t generalized weakness and impaired memory/problem solving. Pt has excellent dispo and should be able to achieve supervision goals while on inpatient rehab. It would also be an ideal time to assess foot drop and potential AFO to stabilize balance and improve gait quality. Rehab Admissions Coordinator to follow up.    Ranelle Oyster, MD, [facility] Health Physical Medicine & Rehabilitation Medical Director Rehabilitation Services 09/07/2022

## 2022-09-09 NOTE — Progress Notes (Signed)
CARDIAC REHAB PHASE I   Post op OHS education including site care, IS use, restrictions, sternal precautions, heart healthy diet and CRP2 reviewed. All questions and concerns addressed. Will refer to [facility] for CRP2. Discharge to CIR today.    [phone]  Woodroe Chen, RN BSN 09/09/2022 11:09 AM

## 2022-09-09 NOTE — Consult Note (Signed)
   [facility] CM Inpatient Consult   09/09/2022  Lorrin Pinsky Renz Feb 24, 1940 [id number]  Follow up:  Transitional needs.  Patient has insurance approval for a CIR transition from acute hospital.  Plan:  Follow for progress and needs during CIR stay for any community care coordination needs for returning to community, if appropriate then.   For questions, please contact:  Charlesetta Shanks, RN BSN CCM [facility]  [phone] business mobile phone Toll free office [phone]  Fax number: [phone] [email or web address]

## 2022-09-09 NOTE — Progress Notes (Addendum)
      [address]       [place] [address]             [phone]        8 Days Post-Op Procedure(s) (LRB): CORONARY ARTERY BYPASS GRAFTING (CABG) times three using left internal mammary artery and right saphenous vein. (N/A) TRANSESOPHAGEAL ECHOCARDIOGRAM (N/A)  Subjective: Patient just waking up this am. He has no specific complaint.  Objective: Vital signs in last 24 hours: Temp:  [97.5 F (36.4 C)-98 F (36.7 C)] 97.8 F (36.6 C) (08/15 0540) Pulse Rate:  [71-73] 71 (08/14 1600) Cardiac Rhythm: Heart block (08/14 1949) Resp:  [17-20] 19 (08/15 0540) BP: (109-152)/(60-75) 111/61 (08/15 0540) SpO2:  [92 %-98 %] 98 % (08/14 1600) Weight:  [88.2 kg] 88.2 kg (08/15 0540)  Pre op weight 89 kg Current Weight  09/09/22 88.2 kg      Intake/Output from previous day: 08/14 0701 - 08/15 0700 In: -  Out: 400 [Urine:400]   Physical Exam:  Cardiovascular: RRR Pulmonary: Clear to auscultation bilaterally. Abdomen: Soft, non tender, bowel sounds present. Extremities: No lower extremity edema. Ecchymosis right thigh Wounds: Both sternal and RLE wounds are clean and dry.  No erythema or signs of infection.  Lab Results: CBC: Recent Labs    09/07/22 0133  WBC 8.3  HGB 9.4*  HCT 28.5*  PLT 205   BMET:  Recent Labs    09/06/22 1048 09/07/22 0133  NA 133* 134*  K 4.1 3.8  CL 95* 100  CO2 24 25  GLUCOSE 159* 116*  BUN 18 19  CREATININE 1.44* 1.36*  CALCIUM 8.8* 8.2*    PT/INR:  Lab Results  Component Value Date   INR 1.5 (H) 09/01/2022   INR 1.1 09/01/2022   INR 1.03 01/09/2018   ABG:  INR: Will add last result for INR, ABG once components are confirmed Will add last 4 CBG results once components are confirmed  Assessment/Plan:  1. CV - S/p STEMI. SR, first degree heart block. On Lopressor 12.5 mg bid, Amiodarone 400 mg tid, Plavix 75 mg daily. Will decrease Amiodarone. 2.  Pulmonary - On room air. Encourage incentive  spirometer 3. Volume Overload - On Lasix 40 mg daily.  4.  Expected post op acute blood loss anemia - Last H and H stable at 9.4 and 28.5 5. Mild thrombocytopenia resolved-platelets this am up to 205,000 7. Last creatinine  slightly decreased to 1.36. Creatinine prior to surgery 1.23. 8. History of BPH-continue Flomax 9. Remove sutures 10. Deconditioned-PT/OT. To CIR when bed available as we now have insurance approval   M ZimmermanPA-C 6:59 AM

## 2022-09-09 NOTE — Progress Notes (Signed)
Patient refused head to toe assessment and midnight and 4 am vitals. He is agreeable to be rounded on through the night but not awaken. Patient educated and the purpose for assessment and encourage to cooperate with the careplan. Will continue to monitor.

## 2022-09-09 NOTE — Progress Notes (Signed)
Chest tube sutures removed per order. 

## 2022-09-09 NOTE — H&P (Signed)
Physical Medicine and Rehabilitation Admission H&P     CC: Functional deficits secondary to CAD; s/p CABG   HPI: Collin [name] is an 82 year old male with a history of CAD who was working out at his gym on 09/01/2022 during a usual work-out (performs three days a week) and developed onset of chest tightness. He drove home, took aspirin 325 mg, but when pain did not subside he called EMS and was brought to [facility] ED. The ECG that was done by EMS personnel and demonstrated inferior lead ST elevation with anterior lead depression. Dr. Eldridge Dace was consulted and was taken for emergency heart catheterization. Acute inferior MI due to severe ostial circumflex and distal left main stenosis.  Severe mid LAD disease and ostial second diagonal disease.  Patent RCA.  Normal LV by LV gram with LVEDP of 21. Given the anatomy, plan for emergent bypass surgery.  IABP was placed in the right femoral artery.  The patient was also started on IV nitroglycerin.  Dr. Leafy Ro evaluated the patient and he underwent CABG times 3 vessels using left internal mammary artery and right saphenous vein. Extubated on 8/08. Seen by Dr. Izora Ribas on 8/08>>wean IABP, amiodarone for prevention--no evidence of AF. Later had evidence of pAF and amiodarone continues with taper. Patient is able to stand without LOB with min A. Standing rest break required during ambulation due to fatigue. Tolerating diet. The patient requires inpatient medicine and rehabilitation evaluations and services for ongoing dysfunction secondary to debility from CABG.   Pt reports having pain in R shoulder lifting weights prior to admission- thought needed some surgical intervention? Also hx of lumbar fusion and chronic back pain from that with impaired balance- admits also to neuropathy in legs which impairs that balance- with L foot drop /as well.  Pt also is HOH and admits to cognitively, being  " a little loopy"_ says not seeing ants crawling on things anymore, but  still "don't feel at baseline".    Had foot up splint for LLE from [facility]. LBM 2-3 days ago- feels constipated. Urinating OK using Purwick but feels could go with urinal.   Wants to wait for SLP for now   Review of Systems  Constitutional:  Negative for chills and fever.  HENT:  Negative for congestion and sore throat.        Hoarse from intubation  Eyes:  Negative for blurred vision and double vision.  Respiratory:  Negative for cough and shortness of breath.   Cardiovascular:  Negative for chest pain and palpitations.  Gastrointestinal:  Positive for constipation. Negative for nausea and vomiting.  Genitourinary:  Negative for dysuria and urgency.  Musculoskeletal:  Positive for back pain, joint pain and myalgias.  Neurological:  Positive for weakness. Negative for dizziness and headaches.  Psychiatric/Behavioral:  Positive for memory loss.         Past Medical History:  Diagnosis Date   Allergy     Arthritis     BPH (benign prostatic hyperplasia)     CAD (coronary artery disease)     Cataract     Diverticulosis of colon     Hx of adenomatous colonic polyps 05/22/2014   Hyperlipidemia     Hypertension     MYOCARDIAL INFARCTION, HX OF 07/20/2006    Qualifier: Diagnosis of  By: Cato Mulligan MD, Bruce               Past Surgical History:  Procedure Laterality Date   CATARACT EXTRACTION Bilateral  Bil   COLONOSCOPY       CORONARY ARTERY BYPASS GRAFT N/A 09/01/2022    Procedure: CORONARY ARTERY BYPASS GRAFTING (CABG) times three using left internal mammary artery and right saphenous vein.;  Surgeon: Eugenio Hoes, MD;  Location: [facility] OR;  Service: Open Heart Surgery;  Laterality: N/A;   CORONARY/GRAFT ACUTE MI REVASCULARIZATION N/A 09/01/2022    Procedure: Coronary/Graft Acute MI Revascularization;  Surgeon: Corky Crafts, MD;  Location: [facility] INVASIVE CV LAB;  Service: Cardiovascular;  Laterality: N/A;   IABP INSERTION N/A 09/01/2022    Procedure: IABP Insertion;   Surgeon: Corky Crafts, MD;  Location: [facility] INVASIVE CV LAB;  Service: Cardiovascular;  Laterality: N/A;   LEFT HEART CATH AND CORONARY ANGIOGRAPHY N/A 09/01/2022    Procedure: LEFT HEART CATH AND CORONARY ANGIOGRAPHY;  Surgeon: Corky Crafts, MD;  Location: [facility] INVASIVE CV LAB;  Service: Cardiovascular;  Laterality: N/A;   LUMBAR LAMINECTOMY/DECOMPRESSION MICRODISCECTOMY Left 01/20/2018    Procedure: Left Lumbar five Sacral one extraforaminal decompression;  Surgeon: Tia Alert, MD;  Location: [facility] OR;  Service: Neurosurgery;  Laterality: Left;   PTCA       TEE WITHOUT CARDIOVERSION N/A 09/01/2022    Procedure: TRANSESOPHAGEAL ECHOCARDIOGRAM;  Surgeon: Eugenio Hoes, MD;  Location: [facility] OR;  Service: Open Heart Surgery;  Laterality: N/A;             Family History  Problem Relation Age of Onset   Heart disease Mother          CABG   Dementia Mother 53   Stroke Father     Heart disease Father     Kidney disease Father          renal failure   AAA (abdominal aortic aneurysm) Father     Hypertension Sister     Diabetes Brother     AAA (abdominal aortic aneurysm) Paternal Uncle     Breast cancer Daughter     Colon cancer Neg Hx          Social History:  reports that he has never smoked. He has never used smokeless tobacco. He reports current alcohol use. He reports that he does not use drugs. Allergies:  Allergies       Allergies  Allergen Reactions   Penicillins Rash      Immediate rash            Medications Prior to Admission  Medication Sig Dispense Refill   Ascorbic Acid (VITAMIN C PO) Take 1 tablet by mouth daily.       aspirin 81 MG tablet Take 1 tablet (81 mg total) by mouth at bedtime. (Patient taking differently: Take 81 mg by mouth daily.) 30 tablet     Calcium Carb-Cholecalciferol (CALCIUM + VITAMIN D3 PO) Take 1 tablet by mouth daily.       metoprolol succinate (TOPROL-XL) 25 MG 24 hr tablet TAKE 1 TABLET(25 MG) BY MOUTH DAILY 90 tablet 2   Multiple  Vitamins-Minerals (MENS 50+ MULTIVITAMIN) TABS Take 1 tablet by mouth daily.       simvastatin (ZOCOR) 40 MG tablet TAKE 1 TABLET BY MOUTH DAILY AT 6 PM 90 tablet 3   tamsulosin (FLOMAX) 0.4 MG CAPS capsule TAKE 1 CAPSULE(0.4 MG) BY MOUTH DAILY 90 capsule 2              Home: Home Living Family/patient expects to be discharged to:: Private residence Living Arrangements: Spouse/significant other Available Help at Discharge: Family, Available 24 hours/day Type of  Home: Other(Comment) Home Access: Stairs to enter Entrance Stairs-Number of Steps: 3 Entrance Stairs-Rails: Right Home Layout: Two level, Able to live on main level with bedroom/bathroom Alternate Level Stairs-Number of Steps: flight Bathroom Shower/Tub: Health visitor: Standard Bathroom Accessibility: Yes Home Equipment: Information systems manager - built in  Lives With: Spouse   Functional History: Prior Function Prior Level of Function : Independent/Modified Independent Mobility Comments: Active. Goes to gym 3x/wk. Walks several miles several times a week. ADLs Comments: ind   Functional Status:  Mobility: Bed Mobility Overal bed mobility: Needs Assistance Bed Mobility: Rolling, Sidelying to Sit, Sit to Sidelying Rolling: Mod assist, Min assist, Supervision Sidelying to sit: Min assist, Supervision Sit to sidelying: Supervision General bed mobility comments: OOB in chair Transfers Overall transfer level: Needs assistance Equipment used: Rolling walker (2 wheels) Transfers: Sit to/from Stand, Bed to chair/wheelchair/BSC Sit to Stand: Mod assist Bed to/from chair/wheelchair/BSC transfer type:: Step pivot Step pivot transfers: Contact guard assist General transfer comment: unable to stand from chair without using arms; requires physical assist; falls posteriorly Ambulation/Gait Ambulation/Gait assistance: Contact guard assist Gait Distance (Feet): 220 Feet Assistive device: Rolling walker (2 wheels) Gait  Pattern/deviations: Decreased stride length, Trunk flexed, Decreased dorsiflexion - left General Gait Details: cues throughout to keep head up as flexed posture and at times more anterior bias, wearing shoes already with foot up brace on L due to foot drop Gait velocity: decr Gait velocity interpretation: <1.8 ft/sec, indicate of risk for recurrent falls Pre-gait activities: pivotal steps from bed>recliner with RW and CGA; ambulation deferred, session focused on bed mobility and transfer training   ADL: ADL Overall ADL's : Needs assistance/impaired Eating/Feeding: Set up, Sitting Grooming: Set up, Supervision/safety, Sitting Grooming Details (indicate cue type and reason): standing at sink Upper Body Bathing: Minimal assistance Lower Body Bathing: Minimal assistance, Sit to/from stand Upper Body Dressing : Minimal assistance Lower Body Dressing: Maximal assistance, Sitting/lateral leans Lower Body Dressing Details (indicate cue type and reason): donning shoes Toilet Transfer: Moderate assistance Toileting- Clothing Manipulation and Hygiene: Maximal assistance Functional mobility during ADLs: Moderate assistance General ADL Comments: mod A to stand   Cognition: Cognition Overall Cognitive Status: Impaired/Different from baseline Orientation Level: Oriented X4 Cognition Arousal: Alert Behavior During Therapy: WFL for tasks assessed/performed Overall Cognitive Status: Impaired/Different from baseline Area of Impairment: Following commands, Memory, Problem solving Memory: Decreased recall of precautions, Decreased short-term memory Following Commands: Follows multi-step commands with increased time Safety/Judgement: Decreased awareness of safety, Decreased awareness of deficits Problem Solving: Slow processing General Comments: decreased insight/awareness of deficits and how they affect him functionally   Physical Exam: Blood pressure (!) 125/91, pulse 90, temperature 97.6 F (36.4  C), temperature source Oral, resp. rate 17, height 6\' 1"  (1.854 m), weight 88.2 kg, SpO2 91%. Physical Exam Vitals and nursing note reviewed. Exam conducted with a chaperone present.  Constitutional:      General: He is not in acute distress.    Appearance: Normal appearance. He is normal weight.     Comments: Sitting up in bed; awake, alert, appropriate, wife at bedside; NAD  HENT:     Head: Normocephalic and atraumatic.     Right Ear: External ear normal.     Left Ear: External ear normal.     Ears:     Comments: HOH    Nose: Nose normal. No congestion.     Mouth/Throat:     Mouth: Mucous membranes are dry.     Pharynx: Oropharynx is clear. No oropharyngeal  exudate.  Eyes:     General:        Right eye: No discharge.        Left eye: No discharge.     Extraocular Movements: Extraocular movements intact.  Cardiovascular:     Rate and Rhythm: Regular rhythm. Bradycardia present.     Heart sounds: Normal heart sounds. No murmur heard.    No gallop.  Pulmonary:     Effort: Pulmonary effort is normal. No respiratory distress.     Breath sounds: Normal breath sounds. No wheezing, rhonchi or rales.  Abdominal:     Palpations: Abdomen is soft.     Tenderness: There is no abdominal tenderness.     Comments: Hypoactive BS  Genitourinary:    Comments: Purwick with pretty dark amber urine in bag Musculoskeletal:        General: No swelling.     Cervical back: Neck supple. No tenderness.     Comments: UE strength 5-/5 B/L- painful with R deltoid- but still 4+/5 Les- HF 5-/5; KE 5-/5; R DF and PF 5-/5 L DF 3+/5 and PF 5-/5   Skin:    General: Skin is warm and dry.     Comments: Sternal incision looks great Incision on R leg x2- near groin and R calf- look good Severe purple bruising posterior proximal Thigh on R 2 IV's LUE- look OK  Neurological:     General: No focal deficit present.     Mental Status: He is alert and oriented to person, place, and time.     Comments:  Decreased to light touch from knees to toes B/L- worse on LLE Higher level cognition- slightly delayed responses- even with repetition-  Cannot follow 3 step command, but can 1 step and slowed on 2 step command.   Psychiatric:        Mood and Affect: Mood normal.        Behavior: Behavior normal.        Lab Results Last 48 Hours  No results found for this or any previous visit (from the past 48 hour(s)).   Imaging Results (Last 48 hours)  No results found.         Blood pressure (!) 125/91, pulse 90, temperature 97.6 F (36.4 C), temperature source Oral, resp. rate 17, height 6\' 1"  (1.854 m), weight 88.2 kg, SpO2 91%.   Medical Problem List and Plan: 1. Functional deficits secondary to debility due to CABG x3 due to STEMI             -patient may  shower-cover incisions             -ELOS/Goals: 7-10 days mod I             Admit to CIR   2.  Antithrombotics: -DVT/anticoagulation:  Pharmaceutical: Lovenox start renal dose Lovenox             -antiplatelet therapy: Aspirin and Plavix    3. Pain Management: Tylenol as needed   4. Mood/Behavior/Sleep: LCSW to evaluate and provide emotional support             -antipsychotic agents: n/a   5. Neuropsych/cognition: This patient is? capable of making decisions on his own behalf.   6. Skin/Wound Care: Routine skin care checks             -monitor chest and leg incisions   7. Fluids/Electrolytes/Nutrition: Routine Is and Os and follow-up chemistries             -  continue potassium supplement   8: Hypertension: monitor TID and prn (meds see #10)   9: Hyperlipidemia: continue statin   10: CAD s/p CABG 8/07:             -sternal precautions             -continue amiodarone              -on aspirin and Plavix             -continue Lopressor             -follow-up with Dr. Leafy Ro   11: BPH: continue Flomax   12: ABLA: follow-up CBC   13: Volume overload: TEE with normal LV function and EF ~60-65% -continue diuresis with  Lasix 40 mg daily             -monitor volume status/daily weight   14: AKI: improving; follow-up BMP             -encourage PO fluids (not on FR, but getting Lasix)   15: Prediabetes: continue carb modified diet   16: pAF: amiodarone 400 mg Bid for two days, then 200 mg BID for seven days then continue on 200 mg daily at discharge             -follow-up with Dr. Dayle Points 17. Higher level cognitive issues complicated by [facility]- pt wants ot wait on SLP order for now.    18. Constipation- if no BM by tomorrow, suggest Sorbitol     Milinda Antis, PA-C 09/09/2022     I have personally performed a face to face diagnostic evaluation of this patient and formulated the key components of the plan.  Additionally, I have personally reviewed laboratory data, imaging studies, as well as relevant notes and concur with the physician assistant's documentation above.   The patient's status has not changed from the original H&P.  Any changes in documentation from the acute care chart have been noted above.

## 2022-09-09 NOTE — TOC Transition Note (Signed)
Transition of Care [facility]) - CM/SW Discharge Note   Patient Details  Name: Collin [name] MRN: [id number] Date of Birth: [birth year]  Transition of Care [facility]) CM/SW Contact:  Lockie Pares, RN Phone Number: 09/09/2022, 10:28 AM   Clinical Narrative:     Patient has approval for CIR and will be admitted today. No further needs ID  Final next level of care: IP Rehab Facility Barriers to Discharge: No Barriers Identified   Patient Goals and CMS Choice CMS Medicare.gov Compare Post Acute Care list provided to:: Patient Choice offered to / list presented to : Patient  Discharge Placement                         Discharge Plan and Services Additional resources added to the After Visit Summary for     Discharge Planning Services: CM Consult Post Acute Care Choice: Home Health                               Social Determinants of Health (SDOH) Interventions SDOH Screenings   Food Insecurity: No Food Insecurity (09/03/2022)  Housing: Low Risk  (09/03/2022)  Transportation Needs: No Transportation Needs (03/03/2021)  Utilities: Not At Risk (09/03/2022)  Alcohol Screen: Low Risk  (01/03/2020)  Depression (PHQ2-9): Low Risk  (03/04/2022)  Financial Resource Strain: Low Risk  (10/16/2021)  Physical Activity: Sufficiently Active (03/03/2021)  Social Connections: Moderately Integrated (01/03/2020)  Stress: No Stress Concern Present (03/03/2021)  Tobacco Use: Low Risk  (06/08/2022)     Readmission Risk Interventions     No data to display

## 2022-09-09 NOTE — Progress Notes (Signed)
Inpatient Rehabilitation Center Individual Statement of Services  Patient Name:  Brenden Manes Kroenke  Date:  09/09/2022  Welcome to the Inpatient Rehabilitation Center.  Our goal is to provide you with an individualized program based on your diagnosis and situation, designed to meet your specific needs.  With this comprehensive rehabilitation program, you will be expected to participate in at least 3 hours of rehabilitation therapies Monday-Friday, with modified therapy programming on the weekends.  Your rehabilitation program will include the following services:  Physical Therapy (PT), Occupational Therapy (OT), Speech Therapy (ST), 24 hour per day rehabilitation nursing, Therapeutic Recreaction (TR), Neuropsychology, Care Coordinator, Rehabilitation Medicine, Nutrition Services, Pharmacy Services, and Other  Weekly team conferences will be held on Wednesdays to discuss your progress.  Your Inpatient Rehabilitation Care Coordinator will talk with you frequently to get your input and to update you on team discussions.  Team conferences with you and your family in attendance may also be held.  Expected length of stay: 7-10 Days  Overall anticipated outcome:  MOD I - SUP  Depending on your progress and recovery, your program may change. Your Inpatient Rehabilitation Care Coordinator will coordinate services and will keep you informed of any changes. Your Inpatient Rehabilitation Care Coordinator's name and contact numbers are listed  below.  The following services may also be recommended but are not provided by the Inpatient Rehabilitation Center:   Home Health Rehabiltiation Services Outpatient Rehabilitation Services    Arrangements will be made to provide these services after discharge if needed.  Arrangements include referral to agencies that provide these services.  Your insurance has been verified to be:   HTA Your primary doctor is:  [place], Betty, MD  Pertinent information will be shared  with your doctor and your insurance company.  Inpatient Rehabilitation Care Coordinator:  Lavera Guise, [place] [phone] or [phone]  Information discussed with and copy given to patient by: Andria Rhein, 09/09/2022, 3:27 PM

## 2022-09-09 NOTE — Progress Notes (Signed)
Genice Rouge, MD  Physician Physical Medicine and Rehabilitation   PMR Pre-admission    Signed   Date of Service: 09/08/2022  5:52 PM  Related encounter: ED to Hosp-Admission (Discharged) from 09/01/2022 in [facility] 4E CV SURGICAL PROGRESSIVE CARE   Signed     Expand All Collapse All  Show:Clear all [x] Written[x] Templated[] Copied  Added by: [x] Standley Brooking, RN[x] Genice Rouge, MD  [] Hover for details PMR Admission Coordinator Pre-Admission Assessment   Patient: Collin [name] is an 82 y.o., male MRN: [id number] DOB: 11-25-1940 Height: 6\' 1"  (185.4 cm) Weight: 88.2 kg   Insurance Information HMO:     PPO: yes     PCP:      IPA:      80/20:      OTHER:  PRIMARY: [employer]      Policy#: [id number]      Subscriber: pt CM Name: Tammy      Phone#: [phone]     Fax#: [phone]/EMR access Pre-Cert#: [id number] approved for 7 days 8/15 until 8/22 /They have EMR access      Employer:  Benefits:  Phone #: [phone]     Name: 8/14 Eff. Date: 01/25/22     Deduct: none      Out of Pocket Max: $3200      Life Max: none CIR: $295 co pay per day days 1 until 6      SNF: no co pay per day days 1 until 20; $203 co pay per day days 21 until 100 Outpatient: $15 per visits     Co-Pay:  Home Health: 100%      Co-Pay:  DME: 80%     Co-Pay: 20% Providers: in network  SECONDARY: none   Financial Counselor:       Phone#:    The Data processing manager" for patients in Inpatient Rehabilitation Facilities with attached "Privacy Act Statement-Health Care Records" was provided and verbally reviewed with: Patient and Family   Emergency Contact Information Contact Information       Name Relation Home Work Mobile    [place] Spouse [phone]   [phone]         Other Contacts       Name Relation Home Work Mobile    [place] Daughter     [phone]         Current Medical History  Patient Admitting Diagnosis: Debility s/p CABG    History of Present Illness: 82 year old male with history of CAD, HTN, HLD, BPH, CKD stage IIIa, and lumbar disc disease. Patient was working out at [employer] and developed acute onset chest tightness. Drove himself home and then called EMS. Presented on 09/01/22 . Patient took ASA at home prior to arrival of EMS.   EKG showed inferior STEMI, underwent cardicac cath which showed severe left main disease and ope RCA, IABP was placed. CT suregery consulted fo revaluation of CABG. Patietn underwent emergency CABG x 3 on 8/7. Postoperative with atrial fib with controlled rate and given amioadarone. Began metoprolol. CHest tubes removed and succefully extubated. Atorvastatin for HLD. Hgb A1c 6. Continue SSI. MOnitor blood loss anemia. Avoid nephrotocis, and monitor serum creatinined. COmplaints of contsitpation. COnitnue dulcolax, snokot and Miralax. Lovenox for prophlaxis. Diuresis postop.   Patient's medical record from [facility] has been reviewed by the rehabilitation admission coordinator and physician.   Past Medical History      Past Medical History:  Diagnosis Date   Allergy     Arthritis  BPH (benign prostatic hyperplasia)     CAD (coronary artery disease)     Cataract     Diverticulosis of colon     Hx of adenomatous colonic polyps 05/22/2014   Hyperlipidemia     Hypertension     MYOCARDIAL INFARCTION, HX OF 07/20/2006    Qualifier: Diagnosis of  By: Cato Mulligan MD, Bruce          Has the patient had major surgery during 100 days prior to admission? Yes   Family History   family history includes AAA (abdominal aortic aneurysm) in his father and paternal uncle; Breast cancer in his daughter; Dementia (age of onset: 8) in his mother; Diabetes in his brother; Heart disease in his father and mother; Hypertension in his sister; Kidney disease in his father; Stroke in his father.   Current Medications  Current Medications    Current Facility-Administered Medications:     amiodarone (PACERONE) tablet 400 mg, 400 mg, Oral, BID, Doree Fudge M, PA-C, 400 mg at 09/09/22 1610   aspirin EC tablet 81 mg, 81 mg, Oral, Daily, 81 mg at 09/09/22 9604 **OR** aspirin chewable tablet 81 mg, 81 mg, Per Tube, Daily, Eugenio Hoes, MD   atorvastatin (LIPITOR) tablet 80 mg, 80 mg, Oral, Daily, Roddenberry, Myron G, PA-C, 80 mg at 09/09/22 5409   clopidogrel (PLAVIX) tablet 75 mg, 75 mg, Oral, Daily, Barrett, Erin R, PA-C, 75 mg at 09/09/22 8119   furosemide (LASIX) tablet 40 mg, 40 mg, Oral, Daily, Barrett, Erin R, PA-C, 40 mg at 09/09/22 0828   melatonin tablet 3 mg, 3 mg, Oral, QHS, Barrett, Erin R, PA-C, 3 mg at 09/08/22 2226   metoprolol tartrate (LOPRESSOR) tablet 12.5 mg, 12.5 mg, Oral, BID, 12.5 mg at 09/09/22 0828 **OR** metoprolol tartrate (LOPRESSOR) 25 mg/10 mL oral suspension 12.5 mg, 12.5 mg, Per Tube, BID, Barrett, Erin R, PA-C   metoprolol tartrate (LOPRESSOR) injection 2.5-5 mg, 2.5-5 mg, Intravenous, Q2H PRN, Roddenberry, Myron G, PA-C   ondansetron (ZOFRAN) injection 4 mg, 4 mg, Intravenous, Q6H PRN, Roddenberry, Myron G, PA-C   Oral care mouth rinse, 15 mL, Mouth Rinse, PRN, Eugenio Hoes, MD   oxyCODONE (Oxy IR/ROXICODONE) immediate release tablet 5-10 mg, 5-10 mg, Oral, Q3H PRN, Roddenberry, Myron G, PA-C, 10 mg at 09/02/22 2105   pantoprazole (PROTONIX) EC tablet 40 mg, 40 mg, Oral, Daily, Roddenberry, Myron G, PA-C, 40 mg at 09/09/22 1478   potassium chloride SA (KLOR-CON M) CR tablet 20 mEq, 20 mEq, Oral, Daily, Barrett, Erin R, PA-C, 20 mEq at 09/09/22 0827   tamsulosin (FLOMAX) capsule 0.4 mg, 0.4 mg, Oral, QPC supper, Roddenberry, Myron G, PA-C, 0.4 mg at 09/08/22 1610   traMADol (ULTRAM) tablet 50 mg, 50 mg, Oral, Q4H PRN, Roddenberry, Myron G, PA-C, 50 mg at 09/08/22 2233   traZODone (DESYREL) tablet 25 mg, 25 mg, Oral, QHS, Eugenio Hoes, MD, 25 mg at 09/08/22 2226     Patients Current Diet:  Diet Order                  Diet Carb Modified  Fluid consistency: Thin; Room service appropriate? Yes  Diet effective now                       Precautions / Restrictions Precautions Precautions: Fall, Sternal Precaution Booklet Issued: Yes (comment) Precaution Comments: educated pt on sternal precautions Other Brace: wears L foot brace for h/o foot drop Restrictions Weight Bearing Restrictions: Yes RUE Weight Bearing: Weight  bearing as tolerated RUE Partial Weight Bearing Percentage or Pounds: 5 LUE Weight Bearing: Weight bearing as tolerated LUE Partial Weight Bearing Percentage or Pounds: 5 RLE Weight Bearing: Weight bearing as tolerated LLE Weight Bearing: Weight bearing as tolerated Other Position/Activity Restrictions: sternal precautions    Has the patient had 2 or more falls or a fall with injury in the past year? No   Prior Activity Level Community (5-7x/wk): Independent, active and driving   Prior Functional Level Self Care: Did the patient need help bathing, dressing, using the toilet or eating? Independent   Indoor Mobility: Did the patient need assistance with walking from room to room (with or without device)? Independent   Stairs: Did the patient need assistance with internal or external stairs (with or without device)? Independent   Functional Cognition: Did the patient need help planning regular tasks such as shopping or remembering to take medications? Independent   Patient Information Are you of Hispanic, Latino/a,or Spanish origin?: A. No, not of Hispanic, Latino/a, or Spanish origin What is your race?: A. White Do you need or want an interpreter to communicate with a doctor or health care staff?: 0. No   Patient's Response To:  Health Literacy and Transportation Is the patient able to respond to health literacy and transportation needs?: Yes Health Literacy - How often do you need to have someone help you when you read instructions, pamphlets, or other written material from your doctor or  pharmacy?: Never In the past 12 months, has lack of transportation kept you from medical appointments or from getting medications?: No In the past 12 months, has lack of transportation kept you from meetings, work, or from getting things needed for daily living?: No   Journalist, newspaper / Equipment Home Equipment: Shower seat - built in   Prior Device Use: Indicate devices/aids used by the patient prior to current illness, exacerbation or injury? None of the above   Current Functional Level Cognition   Overall Cognitive Status: Impaired/Different from baseline Orientation Level: Oriented X4 Following Commands: Follows multi-step commands with increased time Safety/Judgement: Decreased awareness of safety, Decreased awareness of deficits General Comments: decreased insight/awareness of deficits and how they affect him functionally    Extremity Assessment (includes Sensation/Coordination)   Upper Extremity Assessment: Generalized weakness (limited by sternal precautions, otherwise funcitonal)  Lower Extremity Assessment: Generalized weakness LLE Deficits / Details: Lt foot drop     ADLs   Overall ADL's : Needs assistance/impaired Eating/Feeding: Set up, Sitting Grooming: Set up, Supervision/safety, Sitting Grooming Details (indicate cue type and reason): standing at sink Upper Body Bathing: Minimal assistance Lower Body Bathing: Minimal assistance, Sit to/from stand Upper Body Dressing : Minimal assistance Lower Body Dressing: Maximal assistance, Sitting/lateral leans Lower Body Dressing Details (indicate cue type and reason): donning shoes Toilet Transfer: Moderate assistance Toileting- Clothing Manipulation and Hygiene: Maximal assistance Functional mobility during ADLs: Moderate assistance General ADL Comments: mod A to stand     Mobility   Overal bed mobility: Needs Assistance Bed Mobility: Rolling, Sidelying to Sit, Sit to Sidelying Rolling: Mod assist, Min assist,  Supervision Sidelying to sit: Min assist, Supervision Sit to sidelying: Supervision General bed mobility comments: OOB in chair     Transfers   Overall transfer level: Needs assistance Equipment used: Rolling walker (2 wheels) Transfers: Sit to/from Stand, Bed to chair/wheelchair/BSC Sit to Stand: Mod assist Bed to/from chair/wheelchair/BSC transfer type:: Step pivot Step pivot transfers: Contact guard assist General transfer comment: unable to stand from chair without using  arms; requires physical assist; falls posteriorly     Ambulation / Gait / Stairs / Wheelchair Mobility   Ambulation/Gait Ambulation/Gait assistance: Contact guard assist Gait Distance (Feet): 220 Feet Assistive device: Rolling walker (2 wheels) Gait Pattern/deviations: Decreased stride length, Trunk flexed, Decreased dorsiflexion - left General Gait Details: cues throughout to keep head up as flexed posture and at times more anterior bias, wearing shoes already with foot up brace on L due to foot drop Gait velocity: decr Gait velocity interpretation: <1.8 ft/sec, indicate of risk for recurrent falls Pre-gait activities: pivotal steps from bed>recliner with RW and CGA; ambulation deferred, session focused on bed mobility and transfer training     Posture / Balance Dynamic Sitting Balance Sitting balance - Comments: prolonged sitting EOB; pt able to doff socks and don socks/shoes via figure four technique sitting at edge of recliner Balance Overall balance assessment: Needs assistance Sitting-balance support: Feet supported Sitting balance-Leahy Scale: Good Sitting balance - Comments: prolonged sitting EOB; pt able to doff socks and don socks/shoes via figure four technique sitting at edge of recliner Standing balance support: Bilateral upper extremity supported, Reliant on assistive device for balance Standing balance-Leahy Scale: Poor Standing balance comment: reliant on UE support     Special needs/care  consideration Left foot drop an balance issues prior to admit that he was to have worked up outpatient    Previous Home Environment  Living Arrangements: Spouse/significant other  Lives With: Spouse Available Help at Discharge: Family, Available 24 hours/day Type of Home: Other(Comment) Home Layout: Two level, Able to live on main level with bedroom/bathroom Alternate Level Stairs-Number of Steps: flight Home Access: Stairs to enter Entrance Stairs-Rails: Right Entrance Stairs-Number of Steps: 3 Bathroom Shower/Tub: Health visitor: Standard Bathroom Accessibility: Yes How Accessible: Accessible via walker Home Care Services: No   Discharge Living Setting Plans for Discharge Living Setting: Patient's home, Lives with (comment) (wife) Type of Home at Discharge: House Discharge Home Layout: Two level, Able to live on main level with bedroom/bathroom Alternate Level Stairs-Number of Steps: Flight Discharge Home Access: Stairs to enter Entrance Stairs-Rails: Right Entrance Stairs-Number of Steps: 3 Discharge Bathroom Shower/Tub: Walk-in shower Discharge Bathroom Toilet: Standard Discharge Bathroom Accessibility: Yes How Accessible: Accessible via walker Does the patient have any problems obtaining your medications?: No   Social/Family/Support Systems Patient Roles: Spouse, Parent Contact Information: wife, Olegario Messier and daughter, Vernona Rieger Anticipated Caregiver: wife Anticipated Caregiver's Contact Information: see contacts Ability/Limitations of Caregiver: none Caregiver Availability: 24/7 Discharge Plan Discussed with Primary Caregiver: Yes Is Caregiver In Agreement with Plan?: Yes Does Caregiver/Family have Issues with Lodging/Transportation while Pt is in Rehab?: No   Goals Patient/Family Goal for Rehab: Mod I to supervision with PT and OT Expected length of stay: ELOS 7 to 10 days; patient wishes shorter LOS Pt/Family Agrees to Admission and willing to  participate: Yes Program Orientation Provided & Reviewed with Pt/Caregiver Including Roles  & Responsibilities: Yes   Decrease burden of Care through IP rehab admission: n/a   Possible need for SNF placement upon discharge: not anticipated   Patient Condition: I have reviewed medical records from [facility], spoken with CM, and patient, spouse, and daughter. I met with patient at the bedside for inpatient rehabilitation assessment.  Patient will benefit from ongoing PT and OT, can actively participate in 3 hours of therapy a day 5 days of the week, and can make measurable gains during the admission.  Patient will also benefit from the coordinated team approach during an  Inpatient Acute Rehabilitation admission.  The patient will receive intensive therapy as well as Rehabilitation physician, nursing, social worker, and care management interventions.  Due to bladder management, bowel management, safety, skin/wound care, disease management, medication administration, pain management, and patient education the patient requires 24 hour a day rehabilitation nursing.  The patient is currently min assist overall with mobility and basic ADLs.  Discharge setting and therapy post discharge at home with outpatient is anticipated.  Patient has agreed to participate in the Acute Inpatient Rehabilitation Program and will admit today.   Preadmission Screen Completed By:  Clois Dupes, RN MSN 09/09/2022 11:19 AM ______________________________________________________________________   Discussed status with Dr. Berline Chough on 09/09/22 at 1119 and received approval for admission today.   Admission Coordinator:  Clois Dupes, RN MSN time 1119 Date 09/09/22    Assessment/Plan: Diagnosis: CABG x3 Does the need for close, 24 hr/day Medical supervision in concert with the patient's rehab needs make it unreasonable for this patient to be served in a less intensive setting? Yes Co-Morbidities requiring  supervision/potential complications: L foot drop- confusion/delirium; AKI; STEMI; sternal precautions Due to bladder management, bowel management, safety, skin/wound care, disease management, medication administration, pain management, and patient education, does the patient require 24 hr/day rehab nursing? Yes Does the patient require coordinated care of a physician, rehab nurse, PT, OT, and SLP to address physical and functional deficits in the context of the above medical diagnosis(es)? Yes Addressing deficits in the following areas: balance, endurance, locomotion, strength, transferring, bowel/bladder control, bathing, dressing, feeding, grooming, toileting, and cognition Can the patient actively participate in an intensive therapy program of at least 3 hrs of therapy 5 days a week? Yes The potential for patient to make measurable gains while on inpatient rehab is good Anticipated functional outcomes upon discharge from inpatient rehab: modified independent PT, modified independent OT, n/a SLP Estimated rehab length of stay to reach the above functional goals is: 7-10 days Anticipated discharge destination: Home 10. Overall Rehab/Functional Prognosis: good     MD Signature:           Revision History

## 2022-09-09 NOTE — H&P (Signed)
Physical Medicine and Rehabilitation Admission H&P   CC: Functional deficits secondary to CAD; s/p CABG  HPI: Collin [name] is an 82 year old male with a history of CAD who was working out at his gym on 09/01/2022 during a usual work-out (performs three days a week) and developed onset of chest tightness. He drove home, took aspirin 325 mg, but when pain did not subside he called EMS and was brought to [facility] ED. The ECG that was done by EMS personnel and demonstrated inferior lead ST elevation with anterior lead depression. Dr. Eldridge Dace was consulted and was taken for emergency heart catheterization. Acute inferior MI due to severe ostial circumflex and distal left main stenosis.  Severe mid LAD disease and ostial second diagonal disease.  Patent RCA.  Normal LV by LV gram with LVEDP of 21. Given the anatomy, plan for emergent bypass surgery.  IABP was placed in the right femoral artery.  The patient was also started on IV nitroglycerin.  Dr. Leafy Ro evaluated the patient and he underwent CABG times 3 vessels using left internal mammary artery and right saphenous vein. Extubated on 8/08. Seen by Dr. Izora Ribas on 8/08>>wean IABP, amiodarone for prevention--no evidence of AF. Later had evidence of pAF and amiodarone continues with taper. Patient is able to stand without LOB with min A. Standing rest break required during ambulation due to fatigue. Tolerating diet. The patient requires inpatient medicine and rehabilitation evaluations and services for ongoing dysfunction secondary to debility from CABG.  Pt reports having pain in R shoulder lifting weights prior to admission- thought needed some surgical intervention? Also hx of lumbar fusion and chronic back pain from that with impaired balance- admits also to neuropathy in legs which impairs that balance- with L foot drop /as well.  Pt also is HOH and admits to cognitively, being  " a little loopy"_ says not seeing ants crawling on things anymore, but  still "don't feel at baseline".   Had foot up splint for LLE from [facility]. LBM 2-3 days ago- feels constipated. Urinating OK using Purwick but feels could go with urinal.  Wants to wait for SLP for now  Review of Systems  Constitutional:  Negative for chills and fever.  HENT:  Negative for congestion and sore throat.        Hoarse from intubation  Eyes:  Negative for blurred vision and double vision.  Respiratory:  Negative for cough and shortness of breath.   Cardiovascular:  Negative for chest pain and palpitations.  Gastrointestinal:  Positive for constipation. Negative for nausea and vomiting.  Genitourinary:  Negative for dysuria and urgency.  Musculoskeletal:  Positive for back pain, joint pain and myalgias.  Neurological:  Positive for weakness. Negative for dizziness and headaches.  Psychiatric/Behavioral:  Positive for memory loss.    Past Medical History:  Diagnosis Date   Allergy    Arthritis    BPH (benign prostatic hyperplasia)    CAD (coronary artery disease)    Cataract    Diverticulosis of colon    Hx of adenomatous colonic polyps 05/22/2014   Hyperlipidemia    Hypertension    MYOCARDIAL INFARCTION, HX OF 07/20/2006   Qualifier: Diagnosis of  By: Cato Mulligan MD, Bruce     Past Surgical History:  Procedure Laterality Date   CATARACT EXTRACTION Bilateral    Bil   COLONOSCOPY     CORONARY ARTERY BYPASS GRAFT N/A 09/01/2022   Procedure: CORONARY ARTERY BYPASS GRAFTING (CABG) times three using left internal mammary artery  and right saphenous vein.;  Surgeon: Eugenio Hoes, MD;  Location: [facility] OR;  Service: Open Heart Surgery;  Laterality: N/A;   CORONARY/GRAFT ACUTE MI REVASCULARIZATION N/A 09/01/2022   Procedure: Coronary/Graft Acute MI Revascularization;  Surgeon: Corky Crafts, MD;  Location: [facility] INVASIVE CV LAB;  Service: Cardiovascular;  Laterality: N/A;   IABP INSERTION N/A 09/01/2022   Procedure: IABP Insertion;  Surgeon: Corky Crafts, MD;   Location: [facility] INVASIVE CV LAB;  Service: Cardiovascular;  Laterality: N/A;   LEFT HEART CATH AND CORONARY ANGIOGRAPHY N/A 09/01/2022   Procedure: LEFT HEART CATH AND CORONARY ANGIOGRAPHY;  Surgeon: Corky Crafts, MD;  Location: [facility] INVASIVE CV LAB;  Service: Cardiovascular;  Laterality: N/A;   LUMBAR LAMINECTOMY/DECOMPRESSION MICRODISCECTOMY Left 01/20/2018   Procedure: Left Lumbar five Sacral one extraforaminal decompression;  Surgeon: Tia Alert, MD;  Location: [facility] OR;  Service: Neurosurgery;  Laterality: Left;   PTCA     TEE WITHOUT CARDIOVERSION N/A 09/01/2022   Procedure: TRANSESOPHAGEAL ECHOCARDIOGRAM;  Surgeon: Eugenio Hoes, MD;  Location: [facility] OR;  Service: Open Heart Surgery;  Laterality: N/A;   Family History  Problem Relation Age of Onset   Heart disease Mother        CABG   Dementia Mother 81   Stroke Father    Heart disease Father    Kidney disease Father        renal failure   AAA (abdominal aortic aneurysm) Father    Hypertension Sister    Diabetes Brother    AAA (abdominal aortic aneurysm) Paternal Uncle    Breast cancer Daughter    Colon cancer Neg Hx    Social History:  reports that he has never smoked. He has never used smokeless tobacco. He reports current alcohol use. He reports that he does not use drugs. Allergies:  Allergies  Allergen Reactions   Penicillins Rash    Immediate rash   Medications Prior to Admission  Medication Sig Dispense Refill   Ascorbic Acid (VITAMIN C PO) Take 1 tablet by mouth daily.     aspirin 81 MG tablet Take 1 tablet (81 mg total) by mouth at bedtime. (Patient taking differently: Take 81 mg by mouth daily.) 30 tablet    Calcium Carb-Cholecalciferol (CALCIUM + VITAMIN D3 PO) Take 1 tablet by mouth daily.     metoprolol succinate (TOPROL-XL) 25 MG 24 hr tablet TAKE 1 TABLET(25 MG) BY MOUTH DAILY 90 tablet 2   Multiple Vitamins-Minerals (MENS 50+ MULTIVITAMIN) TABS Take 1 tablet by mouth daily.     simvastatin (ZOCOR) 40 MG  tablet TAKE 1 TABLET BY MOUTH DAILY AT 6 PM 90 tablet 3   tamsulosin (FLOMAX) 0.4 MG CAPS capsule TAKE 1 CAPSULE(0.4 MG) BY MOUTH DAILY 90 capsule 2      Home: Home Living Family/patient expects to be discharged to:: Private residence Living Arrangements: Spouse/significant other Available Help at Discharge: Family, Available 24 hours/day Type of Home: Other(Comment) Home Access: Stairs to enter [employer] of Steps: 3 Entrance Stairs-Rails: Right Home Layout: Two level, Able to live on main level with bedroom/bathroom Alternate Level Stairs-Number of Steps: flight Bathroom Shower/Tub: Health visitor: Administrator Accessibility: Yes Home Equipment: Information systems manager - built in  Lives With: Spouse   Functional History: Prior Function Prior Level of Function : Independent/Modified Independent Mobility Comments: Active. Goes to gym 3x/wk. Walks several miles several times a week. ADLs Comments: ind  Functional Status:  Mobility: Bed Mobility Overal bed mobility: Needs Assistance Bed Mobility: Rolling,  Sidelying to Sit, Sit to Sidelying Rolling: Mod assist, Min assist, Supervision Sidelying to sit: Min assist, Supervision Sit to sidelying: Supervision General bed mobility comments: OOB in chair Transfers Overall transfer level: Needs assistance Equipment used: Rolling walker (2 wheels) Transfers: Sit to/from Stand, Bed to chair/wheelchair/BSC Sit to Stand: Mod assist Bed to/from chair/wheelchair/BSC transfer type:: Step pivot Step pivot transfers: Contact guard assist General transfer comment: unable to stand from chair without using arms; requires physical assist; falls posteriorly Ambulation/Gait Ambulation/Gait assistance: Contact guard assist Gait Distance (Feet): 220 Feet Assistive device: Rolling walker (2 wheels) Gait Pattern/deviations: Decreased stride length, Trunk flexed, Decreased dorsiflexion - left General Gait Details: cues  throughout to keep head up as flexed posture and at times more anterior bias, wearing shoes already with foot up brace on L due to foot drop Gait velocity: decr Gait velocity interpretation: <1.8 ft/sec, indicate of risk for recurrent falls Pre-gait activities: pivotal steps from bed>recliner with RW and CGA; ambulation deferred, session focused on bed mobility and transfer training    ADL: ADL Overall ADL's : Needs assistance/impaired Eating/Feeding: Set up, Sitting Grooming: Set up, Supervision/safety, Sitting Grooming Details (indicate cue type and reason): standing at sink Upper Body Bathing: Minimal assistance Lower Body Bathing: Minimal assistance, Sit to/from stand Upper Body Dressing : Minimal assistance Lower Body Dressing: Maximal assistance, Sitting/lateral leans Lower Body Dressing Details (indicate cue type and reason): donning shoes Toilet Transfer: Moderate assistance Toileting- Clothing Manipulation and Hygiene: Maximal assistance Functional mobility during ADLs: Moderate assistance General ADL Comments: mod A to stand  Cognition: Cognition Overall Cognitive Status: Impaired/Different from baseline Orientation Level: Oriented X4 Cognition Arousal: Alert Behavior During Therapy: WFL for tasks assessed/performed Overall Cognitive Status: Impaired/Different from baseline Area of Impairment: Following commands, Memory, Problem solving Memory: Decreased recall of precautions, Decreased short-term memory Following Commands: Follows multi-step commands with increased time Safety/Judgement: Decreased awareness of safety, Decreased awareness of deficits Problem Solving: Slow processing General Comments: decreased insight/awareness of deficits and how they affect him functionally  Physical Exam: Blood pressure (!) 125/91, pulse 90, temperature 97.6 F (36.4 C), temperature source Oral, resp. rate 17, height 6\' 1"  (1.854 m), weight 88.2 kg, SpO2 91%. Physical Exam Vitals  and nursing note reviewed. Exam conducted with a chaperone present.  Constitutional:      General: He is not in acute distress.    Appearance: Normal appearance. He is normal weight.     Comments: Sitting up in bed; awake, alert, appropriate, wife at bedside; NAD  HENT:     Head: Normocephalic and atraumatic.     Right Ear: External ear normal.     Left Ear: External ear normal.     Ears:     Comments: HOH    Nose: Nose normal. No congestion.     Mouth/Throat:     Mouth: Mucous membranes are dry.     Pharynx: Oropharynx is clear. No oropharyngeal exudate.  Eyes:     General:        Right eye: No discharge.        Left eye: No discharge.     Extraocular Movements: Extraocular movements intact.  Cardiovascular:     Rate and Rhythm: Regular rhythm. Bradycardia present.     Heart sounds: Normal heart sounds. No murmur heard.    No gallop.  Pulmonary:     Effort: Pulmonary effort is normal. No respiratory distress.     Breath sounds: Normal breath sounds. No wheezing, rhonchi or rales.  Abdominal:  Palpations: Abdomen is soft.     Tenderness: There is no abdominal tenderness.     Comments: Hypoactive BS  Genitourinary:    Comments: Purwick with pretty dark amber urine in bag Musculoskeletal:        General: No swelling.     Cervical back: Neck supple. No tenderness.     Comments: UE strength 5-/5 B/L- painful with R deltoid- but still 4+/5 Les- HF 5-/5; KE 5-/5; R DF and PF 5-/5 L DF 3+/5 and PF 5-/5   Skin:    General: Skin is warm and dry.     Comments: Sternal incision looks great Incision on R leg x2- near groin and R calf- look good Severe purple bruising posterior proximal Thigh on R 2 IV's LUE- look OK  Neurological:     General: No focal deficit present.     Mental Status: He is alert and oriented to person, place, and time.     Comments: Decreased to light touch from knees to toes B/L- worse on LLE Higher level cognition- slightly delayed responses- even with  repetition-  Cannot follow 3 step command, but can 1 step and slowed on 2 step command.   Psychiatric:        Mood and Affect: Mood normal.        Behavior: Behavior normal.     No results found for this or any previous visit (from the past 48 hour(s)). No results found.    Blood pressure (!) 125/91, pulse 90, temperature 97.6 F (36.4 C), temperature source Oral, resp. rate 17, height 6\' 1"  (1.854 m), weight 88.2 kg, SpO2 91%.  Medical Problem List and Plan: 1. Functional deficits secondary to debility due to CABG x3 due to STEMI  -patient may  shower-cover incisions  -ELOS/Goals: 7-10 days mod I  Admit to CIR  2.  Antithrombotics: -DVT/anticoagulation:  Pharmaceutical: Lovenox start renal dose Lovenox  -antiplatelet therapy: Aspirin and Plavix   3. Pain Management: Tylenol as needed  4. Mood/Behavior/Sleep: LCSW to evaluate and provide emotional support  -antipsychotic agents: n/a  5. Neuropsych/cognition: This patient is? capable of making decisions on his own behalf.  6. Skin/Wound Care: Routine skin care checks  -monitor chest and leg incisions   7. Fluids/Electrolytes/Nutrition: Routine Is and Os and follow-up chemistries  -continue potassium supplement  8: Hypertension: monitor TID and prn (meds see #10)  9: Hyperlipidemia: continue statin  10: CAD s/p CABG 8/07:  -sternal precautions  -continue amiodarone   -on aspirin and Plavix  -continue Lopressor  -follow-up with Dr. Leafy Ro  11: BPH: continue Flomax  12: ABLA: follow-up CBC  13: Volume overload: TEE with normal LV function and EF ~60-65% -continue diuresis with Lasix 40 mg daily  -monitor volume status/daily weight  14: AKI: improving; follow-up BMP  -encourage PO fluids (not on FR, but getting Lasix)  15: Prediabetes: continue carb modified diet  16: pAF: amiodarone 400 mg Bid for two days, then 200 mg BID for seven days then continue on 200 mg daily at discharge  -follow-up with Dr. Dayle Points 17. Higher level cognitive issues complicated by [facility]- pt wants ot wait on SLP order for now.   18. Constipation- if no BM by tomorrow, suggest Sorbitol   Milinda Antis, PA-C 09/09/2022   I have personally performed a face to face diagnostic evaluation of this patient and formulated the key components of the plan.  Additionally, I have personally reviewed laboratory data, imaging studies, as well as relevant  notes and concur with the physician assistant's documentation above.   The patient's status has not changed from the original H&P.  Any changes in documentation from the acute care chart have been noted above.

## 2022-09-10 LAB — CBC WITH DIFFERENTIAL/PLATELET
Abs Immature Granulocytes: 0.07 10*3/uL (ref 0.00–0.07)
Basophils Absolute: 0 10*3/uL (ref 0.0–0.1)
Basophils Relative: 0 %
Eosinophils Absolute: 0.5 10*3/uL (ref 0.0–0.5)
Eosinophils Relative: 5 %
HCT: 30.5 % — ABNORMAL LOW (ref 39.0–52.0)
Hemoglobin: 10.1 g/dL — ABNORMAL LOW (ref 13.0–17.0)
Immature Granulocytes: 1 %
Lymphocytes Relative: 18 %
Lymphs Abs: 1.8 10*3/uL (ref 0.7–4.0)
MCH: 28.4 pg (ref 26.0–34.0)
MCHC: 33.1 g/dL (ref 30.0–36.0)
MCV: 85.7 fL (ref 80.0–100.0)
Monocytes Absolute: 1 10*3/uL (ref 0.1–1.0)
Monocytes Relative: 10 %
Neutro Abs: 6.6 10*3/uL (ref 1.7–7.7)
Neutrophils Relative %: 66 %
Platelets: 332 10*3/uL (ref 150–400)
RBC: 3.56 MIL/uL — ABNORMAL LOW (ref 4.22–5.81)
RDW: 13.1 % (ref 11.5–15.5)
WBC: 10 10*3/uL (ref 4.0–10.5)
nRBC: 0 % (ref 0.0–0.2)

## 2022-09-10 LAB — COMPREHENSIVE METABOLIC PANEL
ALT: 42 U/L (ref 0–44)
AST: 33 U/L (ref 15–41)
Albumin: 2.9 g/dL — ABNORMAL LOW (ref 3.5–5.0)
Alkaline Phosphatase: 44 U/L (ref 38–126)
Anion gap: 8 (ref 5–15)
BUN: 21 mg/dL (ref 8–23)
CO2: 26 mmol/L (ref 22–32)
Calcium: 8.6 mg/dL — ABNORMAL LOW (ref 8.9–10.3)
Chloride: 100 mmol/L (ref 98–111)
Creatinine, Ser: 1.38 mg/dL — ABNORMAL HIGH (ref 0.61–1.24)
GFR, Estimated: 50 mL/min — ABNORMAL LOW (ref >60)
Glucose, Bld: 111 mg/dL — ABNORMAL HIGH (ref 70–99)
Potassium: 4.1 mmol/L (ref 3.5–5.1)
Sodium: 134 mmol/L — ABNORMAL LOW (ref 135–145)
Total Bilirubin: 1.2 mg/dL (ref 0.3–1.2)
Total Protein: 5.9 g/dL — ABNORMAL LOW (ref 6.5–8.1)

## 2022-09-10 LAB — GLUCOSE, CAPILLARY
Glucose-Capillary: 111 mg/dL — ABNORMAL HIGH (ref 70–99)
Glucose-Capillary: 118 mg/dL — ABNORMAL HIGH (ref 70–99)

## 2022-09-10 MED ORDER — PSYLLIUM 95 % PO PACK
1.0000 | PACK | Freq: Every day | ORAL | Status: DC
  Administered 2022-09-11 – 2022-09-23 (×8): 1 via ORAL
  Filled 2022-09-10 (×12): qty 1

## 2022-09-10 NOTE — Evaluation (Signed)
Physical Therapy Assessment and Plan  Patient Details  Name: Jenri Hundt Kanner MRN: [id number] Date of Birth: March 18, 1940  PT Diagnosis: Abnormal posture, Abnormality of gait, Cognitive deficits, Difficulty walking, Impaired cognition, Muscle weakness, and Pain in surgical site. Rehab Potential: Excellent ELOS: 10-14 days   Today's Date: 09/10/2022 PT Individual Time: 3664-4034 PT Individual Time Calculation (min): 77 min     Hospital Problem: Principal Problem:   Debility Active Problems:   STEMI (ST elevation myocardial infarction) (HCC)   S/P CABG x 3   Past Medical History:  Past Medical History:  Diagnosis Date   Allergy    Arthritis    BPH (benign prostatic hyperplasia)    CAD (coronary artery disease)    Cataract    Diverticulosis of colon    Hx of adenomatous colonic polyps 05/22/2014   Hyperlipidemia    Hypertension    MYOCARDIAL INFARCTION, HX OF 07/20/2006   Qualifier: Diagnosis of  By: Cato Mulligan MD, Bruce     Past Surgical History:  Past Surgical History:  Procedure Laterality Date   CATARACT EXTRACTION Bilateral    Bil   COLONOSCOPY     CORONARY ARTERY BYPASS GRAFT N/A 09/01/2022   Procedure: CORONARY ARTERY BYPASS GRAFTING (CABG) times three using left internal mammary artery and right saphenous vein.;  Surgeon: Eugenio Hoes, MD;  Location: [facility] OR;  Service: Open Heart Surgery;  Laterality: N/A;   CORONARY/GRAFT ACUTE MI REVASCULARIZATION N/A 09/01/2022   Procedure: Coronary/Graft Acute MI Revascularization;  Surgeon: Corky Crafts, MD;  Location: [facility] INVASIVE CV LAB;  Service: Cardiovascular;  Laterality: N/A;   IABP INSERTION N/A 09/01/2022   Procedure: IABP Insertion;  Surgeon: Corky Crafts, MD;  Location: [facility] INVASIVE CV LAB;  Service: Cardiovascular;  Laterality: N/A;   LEFT HEART CATH AND CORONARY ANGIOGRAPHY N/A 09/01/2022   Procedure: LEFT HEART CATH AND CORONARY ANGIOGRAPHY;  Surgeon: Corky Crafts, MD;  Location: [facility] INVASIVE CV LAB;   Service: Cardiovascular;  Laterality: N/A;   LUMBAR LAMINECTOMY/DECOMPRESSION MICRODISCECTOMY Left 01/20/2018   Procedure: Left Lumbar five Sacral one extraforaminal decompression;  Surgeon: Tia Alert, MD;  Location: [facility] OR;  Service: Neurosurgery;  Laterality: Left;   PTCA     TEE WITHOUT CARDIOVERSION N/A 09/01/2022   Procedure: TRANSESOPHAGEAL ECHOCARDIOGRAM;  Surgeon: Eugenio Hoes, MD;  Location: [facility] OR;  Service: Open Heart Surgery;  Laterality: N/A;    Assessment & Plan Clinical Impression: jahcere Dumais is an 82 year old male with a history of CAD who was working out at his gym on 09/01/2022 during a usual work-out (performs three days a week) and developed onset of chest tightness. He drove home, took aspirin 325 mg, but when pain did not subside he called EMS and was brought to [facility] ED. The ECG that was done by EMS personnel and demonstrated inferior lead ST elevation with anterior lead depression. Dr. Eldridge Dace was consulted and was taken for emergency heart catheterization. Acute inferior MI due to severe ostial circumflex and distal left main stenosis.  Severe mid LAD disease and ostial second diagonal disease.  Patent RCA.  Normal LV by LV gram with LVEDP of 21. Given the anatomy, plan for emergent bypass surgery.  IABP was placed in the right femoral artery.  The patient was also started on IV nitroglycerin.  Dr. Leafy Ro evaluated the patient and he underwent CABG times 3 vessels using left internal mammary artery and right saphenous vein. Extubated on 8/08. Seen by Dr. Izora Ribas on 8/08>>wean IABP, amiodarone for prevention--no evidence  of AF. Later had evidence of pAF and amiodarone continues with taper. Patient is able to stand without LOB with min A. Standing rest break required during ambulation due to fatigue. Tolerating diet. The patient requires inpatient medicine and rehabilitation evaluations and services for ongoing dysfunction secondary to debility from CABG.   Pt reports  having pain in R shoulder lifting weights prior to admission- thought needed some surgical intervention? Also hx of lumbar fusion and chronic back pain from that with impaired balance- admits also to neuropathy in legs which impairs that balance- with L foot drop /as well.  Pt also is HOH and admits to cognitively, being  " a little loopy"_ says not seeing ants crawling on things anymore, but still "don't feel at baseline".   Patient currently requires min-mod with mobility secondary to muscle weakness, decreased cardiorespiratoy endurance, impaired timing and sequencing, unbalanced muscle activation, decreased coordination, and decreased motor planning, decreased safety awareness, decreased memory, delayed processing, and decreased knowledge of precautsion, and decreased standing balance, decreased postural control, decreased balance strategies, and difficulty maintaining precautions.  Prior to hospitalization, patient was independent  with mobility and lived with Spouse in a House home.  Home access is 3Stairs to enter.  Patient will benefit from skilled PT intervention to maximize safe functional mobility, minimize fall risk, and decrease caregiver burden for planned discharge home with 24 hour supervision.  Anticipate patient will benefit from follow up OP at discharge.  PT - End of Session Activity Tolerance: Tolerates 30+ min activity with multiple rests Endurance Deficit: Yes Endurance Deficit Description: generalized weakness PT Assessment Rehab Potential (ACUTE/IP ONLY): Excellent PT Patient demonstrates impairments in the following area(s): Balance;Endurance;Motor;Safety;Sensory PT Transfers Functional Problem(s): Bed Mobility;Bed to Chair;Car PT Locomotion Functional Problem(s): Ambulation;Stairs;Wheelchair Mobility PT Plan PT Intensity: Minimum of 1-2 x/day ,45 to 90 minutes PT Frequency: 5 out of 7 days PT Duration Estimated Length of Stay: 10-14 days PT Treatment/Interventions:  Ambulation/gait training;Balance/vestibular training;Cognitive remediation/compensation;Community reintegration;Discharge planning;Disease management/prevention;DME/adaptive equipment instruction;Functional electrical stimulation;Functional mobility training;Neuromuscular re-education;Pain management;Patient/family education;Stair training;Therapeutic Activities;Therapeutic Exercise;Splinting/orthotics;UE/LE Strength taining/ROM;UE/LE Coordination activities;Wheelchair propulsion/positioning PT Transfers Anticipated Outcome(s): supervision PT Locomotion Anticipated Outcome(s): supervision PT Recommendation Follow Up Recommendations: Outpatient PT (cardiopulm rehab) Patient destination: Home Equipment Recommended: Rolling walker with 5" wheels;3 in 1 bedside comode;Tub/shower seat Equipment Details: pt does not have any DME   PT Evaluation Precautions/Restrictions Precautions Precautions: Fall;Sternal Precaution Booklet Issued: Yes (comment) Precaution Comments: educated pt on sternal precautions Other Brace: wears L foot brace for h/o foot drop Restrictions Weight Bearing Restrictions: No Other Position/Activity Restrictions: sternal precautions General   Vital SignsTherapy Vitals Temp: 98.2 F (36.8 C) Temp Source: Oral Pulse Rate: 62 Resp: 16 BP: 120/66 Patient Position (if appropriate): Sitting Oxygen Therapy SpO2: 96 % O2 Device: Room Air Pain   Pain Interference Pain Interference Pain Effect on Sleep: 1. Rarely or not at all Pain Interference with Therapy Activities: 2. Occasionally Pain Interference with Day-to-Day Activities: 1. Rarely or not at all Home Living/Prior Functioning Home Living Living Arrangements: Spouse/significant other Available Help at Discharge: Family;Available 24 hours/day Type of Home: House Home Access: Stairs to enter [employer] of Steps: 3 Entrance Stairs-Rails: Right Home Layout: Two level;Able to live on main level with  bedroom/bathroom Alternate Level Stairs-Number of Steps: flight Alternate Level Stairs-Rails: Right Bathroom Shower/Tub: Health visitor: Standard Bathroom Accessibility: Yes Additional Comments: handheld shower, built in seat, 2 grab bars in shower  Lives With: Spouse Prior Function Level of Independence: Independent with basic ADLs;Independent with gait;Independent with  transfers  Able to Take Stairs?: Yes Driving: Yes 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 Leisure:  (last played golf Dec 2023, has been limited by balance) Vision/Perception  Vision - History Baseline Vision: Wears glasses only for reading Visual History: Cataracts;Other (comment) (had cataract surgery) Ability to See in Adequate Light: 0 Adequate Patient Visual Report: No change from baseline Vision - Assessment Eye Alignment: Within Functional Limits Vision Assessment: Vision not tested Perception Perception: Within Functional Limits Praxis Praxis: Not tested  Cognition Overall Cognitive Status: Impaired/Different from baseline Arousal/Alertness: Awake/alert Orientation Level: Oriented X4 Year: 2024 Month: August Day of Week: Correct Memory: Impaired Memory Impairment: Decreased short term memory Comments: reports baseline STM deficits and feeling like his cognition is "kinda off". pt taking slightly extra time to process complex cues/commands. Sensation Sensation Peripheral sensation comments: Hx neuropathy with impaired sensation LLE>RLE Light Touch Impaired Details: Impaired RLE;Impaired LLE Hot/Cold: Not tested Proprioception: Appears Intact Stereognosis: Not tested Coordination Gross Motor Movements are Fluid and Coordinated: No Fine Motor Movements are Fluid and Coordinated: No Coordination and Movement Description: decreased coordination and generalized weakness Heel Shin Test: slow and  slightly jerky Motor  Motor Motor: Other (comment) Motor - Skilled Clinical Observations: Mild LLE foot drop with foot up brace, generalized deconditioning   Trunk/Postural Assessment  Cervical Assessment Cervical Assessment: Within Functional Limits Thoracic Assessment Thoracic Assessment: Within Functional Limits Lumbar Assessment Lumbar Assessment: Within Functional Limits Postural Control Postural Control: Deficits on evaluation Righting Reactions: delayed Protective Responses: delayed Postural Limitations: posterior lean  Balance Balance Balance Assessed: Yes Static Sitting Balance Static Sitting - Balance Support: Feet supported;No upper extremity supported Static Sitting - Level of Assistance: 5: Stand by assistance Dynamic Sitting Balance Dynamic Sitting - Balance Support: Feet supported;No upper extremity supported Dynamic Sitting - Level of Assistance: 5: Stand by assistance Static Standing Balance Static Standing - Balance Support: No upper extremity supported Static Standing - Level of Assistance: 4: Min assist Dynamic Standing Balance Dynamic Standing - Balance Support: During functional activity;Bilateral upper extremity supported;No upper extremity supported Dynamic Standing - Level of Assistance: 3: Mod assist Dynamic Standing - Comments: min assist with RW, Mod assist without UE support Extremity Assessment      RLE Assessment RLE Assessment: Exceptions to [facility] Passive Range of Motion (PROM) Comments: WFL RLE Strength Right Hip Flexion: 4+/5 Right Hip ABduction: 4+/5 Right Hip ADduction: 4+/5 Right Knee Flexion: 4+/5 Right Knee Extension: 4+/5 Right Ankle Dorsiflexion: 4-/5 Right Ankle Plantar Flexion: 4-/5 LLE Assessment LLE Assessment: Exceptions to [facility] Passive Range of Motion (PROM) Comments: WFL LLE Strength Left Hip Flexion: 4/5 Left Hip ABduction: 4+/5 Left Hip ADduction: 4+/5 Left Knee Flexion: 4+/5 Left Knee Extension: 4/5 Left Ankle  Dorsiflexion: 2/5 Left Ankle Plantar Flexion: 2+/5  Care Tool Care Tool Bed Mobility Roll left and right activity   Roll left and right assist level: Supervision/Verbal cueing    Sit to lying activity   Sit to lying assist level: Minimal Assistance - Patient > 75%    Lying to sitting on side of bed activity   Lying to sitting on side of bed assist level: the ability to move from lying on the back to sitting on the side of the bed with no back support.: Minimal Assistance - Patient > 75%     Care Tool Transfers Sit to stand transfer   Sit to stand assist level: Moderate Assistance - Patient 50 - 74%  Chair/bed transfer   Chair/bed transfer assist level: Moderate Assistance - Patient 50 - 74%     Psychologist, clinical transfer assist level: Moderate Assistance - Patient 50 - 74%      Care Tool Locomotion Ambulation   Assist level: Minimal Assistance - Patient > 75% Assistive device: Walker-rolling Max distance: 160  Walk 10 feet activity   Assist level: Minimal Assistance - Patient > 75% Assistive device: Walker-rolling   Walk 50 feet with 2 turns activity   Assist level: Minimal Assistance - Patient > 75% Assistive device: Walker-rolling  Walk 150 feet activity   Assist level: Minimal Assistance - Patient > 75% Assistive device: Walker-rolling  Walk 10 feet on uneven surfaces activity   Assist level: Minimal Assistance - Patient > 75% Assistive device: Walker-rolling  Stairs   Assist level: Minimal Assistance - Patient > 75% Stairs assistive device: 2 hand rails Max number of stairs: 8  Walk up/down 1 step activity Walk up/down 1 step or curb (drop down) activity did not occur: Safety/medical concerns (fatigued)      Walk up/down 4 steps activity   Walk up/down 4 steps assist level: Minimal Assistance - Patient > 75% Walk up/down 4 steps assistive device: 2 hand rails  Walk up/down 12 steps activity Walk up/down 12 steps activity did not  occur: Safety/medical concerns (fatigued)      Pick up small objects from floor Pick up small object from the floor (from standing position) activity did not occur: Safety/medical concerns      Wheelchair Is the patient using a wheelchair?: Yes Type of Wheelchair: Manual   Wheelchair assist level: Contact Guard/Touching assist Max wheelchair distance: 50  Wheel 50 feet with 2 turns activity   Assist Level: Contact Guard/Touching assist  Wheel 150 feet activity   Assist Level: Maximal Assistance - Patient 25 - 49%    Refer to Care Plan for Long Term Goals  SHORT TERM GOAL WEEK 1 PT Short Term Goal 1 (Week 1): Patient will complete sit<>stand with CGA. PT Short Term Goal 2 (Week 1): Pt will ambulate >150' with CGA with RW on level surfaces. PT Short Term Goal 3 (Week 1): Patient will recall and verbalize sternal precautions.  Recommendations for other services: None   Skilled Therapeutic Intervention Mobility Bed Mobility Bed Mobility: Rolling Left;Rolling Right;Right Sidelying to Sit Rolling Right: Supervision/verbal cueing Rolling Left: Supervision/Verbal cueing Right Sidelying to Sit: Minimal Assistance - Patient > 75% Sit to Supine: Minimal Assistance - Patient > 75% Transfers Transfers: Sit to Stand;Stand to Sit;Stand Pivot Transfers Sit to Stand: Moderate Assistance - Patient 50-74% Stand to Sit: Minimal Assistance - Patient > 75% Stand Pivot Transfers: Minimal Assistance - Patient > 75% Stand Pivot Transfer Details: Tactile cues for weight shifting;Verbal cues for technique;Manual facilitation for weight shifting Transfer (Assistive device): Rolling walker Locomotion  Gait Gait Distance (Feet): 160 Feet Assistive device: Rolling walker Gait Gait Pattern: Impaired Gait velocity: decr Stairs / Additional Locomotion Stairs: Yes Stairs Assistance: Minimal Assistance - Patient > 75% Stair Management Technique: Step to pattern;Alternating pattern;Two rails Number of  Stairs: 8 Height of Stairs: 6 Ramp: Minimal Assistance - Patient >75% Wheelchair Mobility Wheelchair Mobility: Yes Wheelchair Assistance: Doctor, general practice: Both lower extermities Wheelchair Parts Management: Supervision/cueing Distance: 50   PT eval completed addressing rehab process, PT purpose, POC, ELOS, and goals.  Patient participated in above detailed mobility with multiple therapeutic rest breaks required due  to weakness decrease endurance. Pt requires reminders for sternal precautions to "move in the tube" and deter pushing/pulling with bil Ue's. Pt has significant difficulty with sit>stand transfers and requires assist mod assist to rise. Following mobility testing pt completed 5x forward reaching to touch his toes in seated position followed by repeat sit<>stands with improved anterior lean/weight shift noted. EOS pt left in recliner with alarm on, call bell in reach, and needs met. See functional navigator for further details regarding functional mobility performance.   Discharge Criteria: Patient will be discharged from PT if patient refuses treatment 3 consecutive times without medical reason, if treatment goals not met, if there is a change in medical status, if patient makes no progress towards goals or if patient is discharged from hospital.  The above assessment, treatment plan, treatment alternatives and goals were discussed and mutually agreed upon: by patient  Wynn Maudlin, DPT Acute Rehabilitation Services Office [phone]  09/10/22 5:10 PM

## 2022-09-10 NOTE — Progress Notes (Signed)
Occupational Therapy Session Note  Patient Details  Name: Collin [name] MRN: [id number] Date of Birth: December 15, 1940  Today's Date: 09/10/2022 OT Individual Time: 1356-1440 OT Individual Time Calculation (min): 44 min    Short Term Goals: Week 1:  OT Short Term Goal 1 (Week 1): Pt will don pants with CGA OT Short Term Goal 2 (Week 1): Pt will complete sit <> stand with min A consistently OT Short Term Goal 3 (Week 1): Pt will complete toileting tasks with min A  Skilled Therapeutic Interventions/Progress Updates:  Skilled OT intervention completed with focus on functional mobility, BP assessment, toileting. Pt received seated in recliner, asleep, easily aroused. Pt agreeable to session, despite fatigue reported. No pain reported.  Pt able to verbalize 2/3 sternal precautions accurately with cues to recall avoiding lifting > 10 lbs. Total A to donn bilateral shoes and Lt toe up brace for drop foot. Pt needed 2 trials to power up into standing with hands on thighs for sternal precaution adherence, overall light mod A 2/2 posterior bias. Able to ambulate with RW > w/c about 15 ft with min A.   Dizziness reported. OT encouraged fluid intake, issued pt water. Assessed orthostatics: BP 120/63 (seated); asymptomatic BP 92/61 (standing > 3 mins); symptomatic BP 112/63 (seated); asymptomatic  Education provided about use of TEDs for BP management however pt opting to return to bed 2/2 fatigue. MD notified of TED order request for BP management when OOB. Pt attempted stand with hands on thighs, however used back of knees on w/c to power up despite min A provided, with w/c shifting back behind him, and pt requiring max A to anterior weight shift 2/2 LOB. Min A stand pivot with RW > EOB. Pt able to recall lowering to elbow to initiate bed mobility via log roll. Required mod A for transition supine.  Pt voiced need to urinate. Required total A for urinal placement with only small amount (un-measurable);  NT notified. Pt remained upright in bed, with bed alarm on/activated, and with all needs in reach at end of session.   Therapy Documentation Precautions:  Precautions Precautions: Fall, Sternal Precaution Booklet Issued: Yes (comment) Precaution Comments: educated pt on sternal precautions Other Brace: wears L foot brace for h/o foot drop Restrictions Weight Bearing Restrictions: No Other Position/Activity Restrictions: sternal precautions    Therapy/Group: Individual Therapy  Melvyn Novas, MS, OTR/L  09/10/2022, 3:43 PM

## 2022-09-10 NOTE — Progress Notes (Signed)
Inpatient Rehabilitation Admission Medication Review by a Pharmacist  A complete drug regimen review was completed for this patient to identify any potential clinically significant medication issues.  High Risk Drug Classes Is patient taking? Indication by Medication  Antipsychotic Yes Compazine prn N/V  Anticoagulant Yes Lovenox - VTE ppx  Antibiotic No   Opioid Yes Tramadol prn pain  Antiplatelet Yes Aspirin, clopidogrel - CBG  Hypoglycemics/insulin Yes Insulin - pre-DM  Vasoactive Medication Yes Amiodarone, metoprolol - Afib Furosemide - fluid Tamsulosin - BPH  Chemotherapy No   Other Yes Atorvastatin - HLD Pantoprazole - Reflux Potassium - supplement Trazodone - sleep     Type of Medication Issue Identified Description of Issue Recommendation(s)  Drug Interaction(s) (clinically significant)     Duplicate Therapy     Allergy     No Medication Administration End Date     Incorrect Dose     Additional Drug Therapy Needed     Significant med changes from prior encounter (inform family/care partners about these prior to discharge).    Other       Clinically significant medication issues were identified that warrant physician communication and completion of prescribed/recommended actions by midnight of the next day:  No  Name of provider notified for urgent issues identified:   Provider Method of Notification:     Pharmacist comments: None  Time spent performing this drug regimen review (minutes):  20 minutes  Thank you. Okey Regal, PharmD

## 2022-09-10 NOTE — Progress Notes (Signed)
PROGRESS NOTE   Subjective/Complaints: No new complaints this morning Doing well with OT Does not like finger sticks- discussed Cbgs are stable, d/c CBGS checks  ROS: does not like finger sticks   Objective:   No results found. Recent Labs    09/10/22 0620  WBC 10.0  HGB 10.1*  HCT 30.5*  PLT 332   Recent Labs    09/09/22 1700 09/10/22 0620  NA 135 134*  K 4.2 4.1  CL 95* 100  CO2 27 26  GLUCOSE 129* 111*  BUN 23 21  CREATININE 1.60* 1.38*  CALCIUM 8.8* 8.6*    Intake/Output Summary (Last 24 hours) at 09/10/2022 1153 Last data filed at 09/10/2022 0757 Gross per 24 hour  Intake 600 ml  Output 400 ml  Net 200 ml        Physical Exam: Vital Signs Blood pressure 119/61, pulse 74, temperature 98.4 F (36.9 C), temperature source Oral, resp. rate 18, height 6\' 1"  (1.854 m), weight 86.2 kg, SpO2 96%. Gen: no distress, normal appearing HEENT: oral mucosa pink and moist, NCAT Cardio: Reg rate Genitourinary:    Comments: [place] with pretty dark amber urine in bag Musculoskeletal:        General: No swelling.     Cervical back: Neck supple. No tenderness.     Comments: UE strength 5-/5 B/L- painful with R deltoid- but still 4+/5 Les- HF 5-/5; KE 5-/5; R DF and PF 5-/5 L DF 3+/5 and PF 5-/5   Skin:    General: Skin is warm and dry.     Comments: Sternal incision looks great Incision on R leg x2- near groin and R calf- look good Severe purple bruising posterior proximal Thigh on R 2 IV's LUE- look OK  Neurological:     General: No focal deficit present.     Mental Status: He is alert and oriented to person, place, and time.     Comments: Decreased to light touch from knees to toes B/L- worse on LLE Higher level cognition- slightly delayed responses- even with repetition-  Cannot follow 3 step command, but can 1 step and slowed on 2 step command.   Psychiatric:        Mood and Affect: Mood normal.         Behavior: Behavior normal.   Assessment/Plan: 1. Functional deficits which require 3+ hours per day of interdisciplinary therapy in a comprehensive inpatient rehab setting. Physiatrist is providing close team supervision and 24 hour management of active medical problems listed below. Physiatrist and rehab team continue to assess barriers to discharge/monitor patient progress toward functional and medical goals  Care Tool:  Bathing  Bathing activity did not occur: Refused           Bathing assist       Upper Body Dressing/Undressing Upper body dressing   What is the patient wearing?: Pull over shirt    Upper body assist Assist Level: Supervision/Verbal cueing    Lower Body Dressing/Undressing Lower body dressing      What is the patient wearing?: Pants     Lower body assist Assist for lower body dressing: Moderate Assistance - Patient 50 - 74%  Toileting Toileting    Toileting assist Assist for toileting: Moderate Assistance - Patient 50 - 74%     Transfers Chair/bed transfer  Transfers assist     Chair/bed transfer assist level: Moderate Assistance - Patient 50 - 74%     Locomotion Ambulation   Ambulation assist              Walk 10 feet activity   Assist           Walk 50 feet activity   Assist           Walk 150 feet activity   Assist           Walk 10 feet on uneven surface  activity   Assist           Wheelchair     Assist               Wheelchair 50 feet with 2 turns activity    Assist            Wheelchair 150 feet activity     Assist          Blood pressure 119/61, pulse 74, temperature 98.4 F (36.9 C), temperature source Oral, resp. rate 18, height 6\' 1"  (1.854 m), weight 86.2 kg, SpO2 96%.    Medical Problem List and Plan: 1. Functional deficits secondary to debility due to CABG x3 due to STEMI             -patient may  shower-cover incisions              -ELOS/Goals: 7-10 days mod I             Grounds pass ordered   2.  Antithrombotics: -DVT/anticoagulation:  Pharmaceutical: Lovenox start renal dose Lovenox             -antiplatelet therapy: Aspirin and Plavix    3. Pain Management: Tylenol as needed   4. Mood/Behavior/Sleep: LCSW to evaluate and provide emotional support             -antipsychotic agents: n/a   5. Neuropsych/cognition: This patient is? capable of making decisions on his own behalf.   6. Skin/Wound Care: Routine skin care checks             -monitor chest and leg incisions   7. Fluids/Electrolytes/Nutrition: Routine Is and Os and follow-up chemistries             -continue potassium supplement   8: Hypertension: monitor TID and prn (meds see #10)   9: Hyperlipidemia: continue statin   10: CAD s/p CABG 8/07:             -sternal precautions             -continue amiodarone              -on aspirin and Plavix             -continue Lopressor             -follow-up with Dr. Leafy Ro   11: BPH: continue Flomax   12: ABLA: follow-up CBC   13: Volume overload: TEE with normal LV function and EF ~60-65% -continue diuresis with Lasix 40 mg daily             -monitor volume status/daily weight   -weight reviewed and is 86.2 kg on 8/15, asked nursing to weigh patient 8/16 14: AKI: improving; follow-up BMP             -  encourage PO fluids (not on FR, but getting Lasix)  -discussed that creatinine is improving.    15: Prediabetes: continue carb modified diet, d/c CBG checks and insulin   16: pAF: continue amiodarone 400 mg Bid for two days, then 200 mg BID for seven days then continue on 200 mg daily at discharge             -follow-up with Dr. Dayle Points  17. Higher level cognitive issues complicated by [facility]- pt wants ot wait on SLP order for now.    18. Constipation- if no BM by tomorrow, suggest Sorbitol, messaged nursing to see if he had a BM, last listed is 8/13.       LOS: 1 days A FACE TO FACE  EVALUATION WAS PERFORMED  Drema Pry Antwone Capozzoli 09/10/2022, 11:53 AM

## 2022-09-10 NOTE — Progress Notes (Signed)
Inpatient Rehabilitation  Patient information reviewed and entered into eRehab system by Melissa M. Bowie, M.A., CCC/SLP, PPS Coordinator.  Information including medical coding, functional ability and quality indicators will be reviewed and updated through discharge.    

## 2022-09-10 NOTE — Progress Notes (Signed)
Inpatient Rehabilitation Care Coordinator Assessment and Plan Patient Details  Name: Collin [name] MRN: [id number] Date of Birth: Nov 23, 1940  Today's Date: 09/10/2022  Hospital Problems: Principal Problem:   Debility Active Problems:   STEMI (ST elevation myocardial infarction) (HCC)   S/P CABG x 3  Past Medical History:  Past Medical History:  Diagnosis Date   Allergy    Arthritis    BPH (benign prostatic hyperplasia)    CAD (coronary artery disease)    Cataract    Diverticulosis of colon    Hx of adenomatous colonic polyps 05/22/2014   Hyperlipidemia    Hypertension    MYOCARDIAL INFARCTION, HX OF 07/20/2006   Qualifier: Diagnosis of  By: Cato Mulligan MD, Bruce     Past Surgical History:  Past Surgical History:  Procedure Laterality Date   CATARACT EXTRACTION Bilateral    Bil   COLONOSCOPY     CORONARY ARTERY BYPASS GRAFT N/A 09/01/2022   Procedure: CORONARY ARTERY BYPASS GRAFTING (CABG) times three using left internal mammary artery and right saphenous vein.;  Surgeon: Eugenio Hoes, MD;  Location: [facility] OR;  Service: Open Heart Surgery;  Laterality: N/A;   CORONARY/GRAFT ACUTE MI REVASCULARIZATION N/A 09/01/2022   Procedure: Coronary/Graft Acute MI Revascularization;  Surgeon: Corky Crafts, MD;  Location: [facility] INVASIVE CV LAB;  Service: Cardiovascular;  Laterality: N/A;   IABP INSERTION N/A 09/01/2022   Procedure: IABP Insertion;  Surgeon: Corky Crafts, MD;  Location: [facility] INVASIVE CV LAB;  Service: Cardiovascular;  Laterality: N/A;   LEFT HEART CATH AND CORONARY ANGIOGRAPHY N/A 09/01/2022   Procedure: LEFT HEART CATH AND CORONARY ANGIOGRAPHY;  Surgeon: Corky Crafts, MD;  Location: [facility] INVASIVE CV LAB;  Service: Cardiovascular;  Laterality: N/A;   LUMBAR LAMINECTOMY/DECOMPRESSION MICRODISCECTOMY Left 01/20/2018   Procedure: Left Lumbar five Sacral one extraforaminal decompression;  Surgeon: Tia Alert, MD;  Location: [facility] OR;  Service: Neurosurgery;  Laterality:  Left;   PTCA     TEE WITHOUT CARDIOVERSION N/A 09/01/2022   Procedure: TRANSESOPHAGEAL ECHOCARDIOGRAM;  Surgeon: Eugenio Hoes, MD;  Location: [facility] OR;  Service: Open Heart Surgery;  Laterality: N/A;   Social History:  reports that he has never smoked. He has never used smokeless tobacco. He reports current alcohol use. He reports that he does not use drugs.  Family / Support Systems Patient Roles: Spouse, Programme researcher, broadcasting/film/video, Parent Spouse/Significant Other: Counsellor, Spouse Children: Daughter, Vernona Rieger Other Supports: n.a Anticipated Caregiver: Spouse (primary) and daughter Ability/Limitations of Caregiver: none Caregiver Availability: 24/7 Family Dynamics: supportice spouse and daughter  Social History Preferred language: English Religion: Baptist Cultural Background: Independnt Education: HS Health Literacy - How often do you need to have someone help you when you read instructions, pamphlets, or other written material from your doctor or pharmacy?: Never Writes: Yes Legal History/Current Legal Issues: n/a Guardian/Conservator: n/a   Abuse/Neglect Abuse/Neglect Assessment Can Be Completed: Yes Physical Abuse: Denies Verbal Abuse: Denies Sexual Abuse: Denies Exploitation of patient/patient's resources: Denies Self-Neglect: Denies  Patient response to: Social Isolation - How often do you feel lonely or isolated from those around you?: Never  Emotional Status Pt's affect, behavior and adjustment status: Pleasant, HOH Recent Psychosocial Issues: coping Psychiatric History: n/a Substance Abuse History: n/a  Patient / Family Perceptions, Expectations & Goals Pt/Family understanding of illness & functional limitations: yes, spoke with patient and daughter at bedside Premorbid pt/family roles/activities: Independent overall, and driving Anticipated changes in roles/activities/participation: spouse and daughter able to assist patient if needed Pt/family expectations/goals:  Sup-MODI  [employer]  Resources [employer]: None Premorbid Home Care/DME Agencies: None (shower seat) Transportation available at discharge: Spouse or daughter Is the patient able to respond to transportation needs?: Yes In the past 12 months, has lack of transportation kept you from medical appointments or from getting medications?: No In the past 12 months, has lack of transportation kept you from meetings, work, or from getting things needed for daily living?: No Resource referrals recommended: Neuropsychology  Discharge Planning Living Arrangements: Spouse/significant other Support Systems: Spouse/significant other, Children Type of Residence: Private residence Financial Resources: Social Security Financial Screen Referred: No Living Expenses: Own Money Management: Patient, Spouse Does the patient have any problems obtaining your medications?: No Home Management: Independent Care Coordinator Barriers to Discharge: Lack of/limited family support, Insurance for SNF coverage Care Coordinator Anticipated Follow Up Needs: HH/OP DC Planning Additional Notes/Comments: Patient HOH and left hearing aids. Expected length of stay: 7-10 Days  Clinical Impression Sw met with patient and daughter at bedside. Daughter plans to provide pt's spouse with SW contact information. Patient discharging home with spouse to assist 24/7. 3 steps to enter. No additional questions or concerns. SW will continue to FU with pt and family.   Andria Rhein 09/10/2022, 2:47 PM

## 2022-09-10 NOTE — Evaluation (Signed)
Occupational Therapy Assessment and Plan  Patient Details  Name: Collin [name] MRN: [id number] Date of Birth: 11-27-1940  OT Diagnosis: acute pain and muscle weakness (generalized) Rehab Potential: Rehab Potential (ACUTE ONLY): Good ELOS: 10-14 days   Today's Date: 09/10/2022 OT Individual Time: 0102-7253 OT Individual Time Calculation (min): 75 min     Hospital Problem: Principal Problem:   Debility Active Problems:   STEMI (ST elevation myocardial infarction) (HCC)   S/P CABG x 3   Past Medical History:  Past Medical History:  Diagnosis Date   Allergy    Arthritis    BPH (benign prostatic hyperplasia)    CAD (coronary artery disease)    Cataract    Diverticulosis of colon    Hx of adenomatous colonic polyps 05/22/2014   Hyperlipidemia    Hypertension    MYOCARDIAL INFARCTION, HX OF 07/20/2006   Qualifier: Diagnosis of  By: Cato Mulligan MD, Bruce     Past Surgical History:  Past Surgical History:  Procedure Laterality Date   CATARACT EXTRACTION Bilateral    Bil   COLONOSCOPY     CORONARY ARTERY BYPASS GRAFT N/A 09/01/2022   Procedure: CORONARY ARTERY BYPASS GRAFTING (CABG) times three using left internal mammary artery and right saphenous vein.;  Surgeon: Eugenio Hoes, MD;  Location: [facility] OR;  Service: Open Heart Surgery;  Laterality: N/A;   CORONARY/GRAFT ACUTE MI REVASCULARIZATION N/A 09/01/2022   Procedure: Coronary/Graft Acute MI Revascularization;  Surgeon: Corky Crafts, MD;  Location: [facility] INVASIVE CV LAB;  Service: Cardiovascular;  Laterality: N/A;   IABP INSERTION N/A 09/01/2022   Procedure: IABP Insertion;  Surgeon: Corky Crafts, MD;  Location: [facility] INVASIVE CV LAB;  Service: Cardiovascular;  Laterality: N/A;   LEFT HEART CATH AND CORONARY ANGIOGRAPHY N/A 09/01/2022   Procedure: LEFT HEART CATH AND CORONARY ANGIOGRAPHY;  Surgeon: Corky Crafts, MD;  Location: [facility] INVASIVE CV LAB;  Service: Cardiovascular;  Laterality: N/A;   LUMBAR  LAMINECTOMY/DECOMPRESSION MICRODISCECTOMY Left 01/20/2018   Procedure: Left Lumbar five Sacral one extraforaminal decompression;  Surgeon: Tia Alert, MD;  Location: [facility] OR;  Service: Neurosurgery;  Laterality: Left;   PTCA     TEE WITHOUT CARDIOVERSION N/A 09/01/2022   Procedure: TRANSESOPHAGEAL ECHOCARDIOGRAM;  Surgeon: Eugenio Hoes, MD;  Location: [facility] OR;  Service: Open Heart Surgery;  Laterality: N/A;    Assessment & Plan Clinical Impression: Collin [name] is an 82 year old male with a history of CAD who was working out at his gym on 09/01/2022 during a usual work-out (performs three days a week) and developed onset of chest tightness. He drove home, took aspirin 325 mg, but when pain did not subside he called EMS and was brought to [facility] ED. The ECG that was done by EMS personnel and demonstrated inferior lead ST elevation with anterior lead depression. Dr. Eldridge Dace was consulted and was taken for emergency heart catheterization. Acute inferior MI due to severe ostial circumflex and distal left main stenosis. Severe mid LAD disease and ostial second diagonal disease. Patent RCA. Normal LV by LV gram with LVEDP of 21. Given the anatomy, plan for emergent bypass surgery. IABP was placed in the right femoral artery. The patient was also started on IV nitroglycerin. Dr. Leafy Ro evaluated the patient and he underwent CABG times 3 vessels using left internal mammary artery and right saphenous vein. Extubated on 8/08. Seen by Dr. Izora Ribas on 8/08>>wean IABP, amiodarone for prevention--no evidence of AF. Later had evidence of pAF and amiodarone continues with taper. Patient is  able to stand without LOB with min A. Standing rest break required during ambulation due to fatigue. Tolerating diet. The patient requires inpatient medicine and rehabilitation evaluations and services for ongoing dysfunction secondary to debility from CABG. Patient transferred to CIR on 09/09/2022 .    Patient currently requires  mod with basic self-care skills secondary to muscle weakness, decreased cardiorespiratoy endurance, decreased memory, and decreased standing balance, decreased postural control, and decreased balance strategies.  Prior to hospitalization, patient could complete ADLs with independent .  Patient will benefit from skilled intervention to increase independence with basic self-care skills prior to discharge home with care partner.  Anticipate patient will require intermittent supervision and follow up outpatient.  OT - End of Session Activity Tolerance: Tolerates 30+ min activity without fatigue Endurance Deficit: Yes Endurance Deficit Description: generalized weakness OT Assessment Rehab Potential (ACUTE ONLY): Good OT Patient demonstrates impairments in the following area(s): Balance;Safety;Endurance;Motor;Pain OT Basic ADL's Functional Problem(s): Bathing;Dressing;Toileting OT Transfers Functional Problem(s): Toilet;Tub/Shower OT Additional Impairment(s): None OT Plan OT Intensity: Minimum of 1-2 x/day, 45 to 90 minutes OT Frequency: 5 out of 7 days OT Duration/Estimated Length of Stay: 10-14 days OT Treatment/Interventions: Balance/vestibular training;Discharge planning;Pain management;Self Care/advanced ADL retraining;Therapeutic Activities;UE/LE Coordination activities;Therapeutic Exercise;Functional mobility training;Patient/family education;Community reintegration;DME/adaptive equipment instruction;UE/LE Strength taining/ROM;Psychosocial support OT Self Feeding Anticipated Outcome(s): no goal OT Basic Self-Care Anticipated Outcome(s): (S) OT Toileting Anticipated Outcome(s): (S) OT Bathroom Transfers Anticipated Outcome(s): (S) OT Recommendation Patient destination: Home Follow Up Recommendations: Outpatient OT Equipment Recommended: 3 in 1 bedside comode   OT Evaluation Precautions/Restrictions  Precautions Precautions: Fall;Sternal Precaution Booklet Issued: Yes  (comment) Restrictions Weight Bearing Restrictions: No General Chart Reviewed: Yes Family/Caregiver Present: No Vital Signs Therapy Vitals Pulse Rate: 74 BP: 119/61 Pain   Home Living/Prior Functioning Home Living Family/patient expects to be discharged to:: Private residence Living Arrangements: Spouse/significant other Type of Home: Other(Comment) Entrance Stairs-Number of Steps: 3 Entrance Stairs-Rails: Right Home Layout: Two level, Able to live on main level with bedroom/bathroom Alternate Level Stairs-Number of Steps: flight Alternate Level Stairs-Rails: Right Bathroom Shower/Tub: Health visitor: Standard Bathroom Accessibility: Yes  Lives With: Spouse IADL History Homemaking Responsibilities: Yes Meal Prep Responsibility: Secondary Laundry Responsibility: Secondary Cleaning Responsibility: Secondary Bill Paying/Finance Responsibility: Primary Current License: Yes Mode of Transportation: Car Occupation: Retired Prior Function Level of Independence: Independent with basic ADLs, Independent with gait, Independent with transfers  Able to Take Stairs?: Yes Driving: Yes Vocation: Retired Administrator, sports Baseline Vision/History: 0 No visual deficits Ability to See in Adequate Light: 0 Adequate Patient Visual Report: No change from baseline Vision Assessment?: Wears glasses for reading Perception  Perception: Within Functional Limits Praxis Praxis: WFL Cognition Cognition Overall Cognitive Status: Impaired/Different from baseline Arousal/Alertness: Awake/alert Orientation Level: Person;Place;Situation Person: Oriented Place: Oriented Situation: Oriented Memory: Impaired Memory Impairment: Decreased short term memory Decreased Short Term Memory: Verbal complex;Functional complex Awareness: Appears intact Problem Solving: Appears intact Safety/Judgment: Appears intact Comments: reports baseline STM deficits and feeling like his cognition is "kinda  off" Brief Interview for Mental Status (BIMS) Repetition of Three Words (First Attempt): 3 Temporal Orientation: Year: Correct Temporal Orientation: Month: Accurate within 5 days Temporal Orientation: Day: Correct Recall: "Sock": Yes, no cue required Recall: "Blue": Yes, after cueing ("a color") Recall: "Bed": No, could not recall BIMS Summary Score: 12 Sensation Sensation Light Touch: Impaired Detail Peripheral sensation comments: Hx neuropathy with impaired sensation LLE>RLE Light Touch Impaired Details: Impaired RLE;Impaired LLE Coordination Gross Motor Movements are Fluid and Coordinated: No Fine Motor Movements are Fluid  and Coordinated: No Coordination and Movement Description: decreased coordination and generalized weakness Finger Nose Finger Test: LUE tremor baseline Motor  Motor Motor: Other (comment) Motor - Skilled Clinical Observations: Mild LLE foot drop with foot up brace, generalized deconditioning  Trunk/Postural Assessment  Cervical Assessment Cervical Assessment: Within Functional Limits Thoracic Assessment Thoracic Assessment: Within Functional Limits Lumbar Assessment Lumbar Assessment: Within Functional Limits Postural Control Postural Control: Deficits on evaluation Righting Reactions: delayed 2/2 cervical precautions  Balance Balance Balance Assessed: Yes Static Sitting Balance Static Sitting - Balance Support: Feet supported Static Sitting - Level of Assistance: 5: Stand by assistance Dynamic Sitting Balance Dynamic Sitting - Balance Support: Feet supported Dynamic Sitting - Level of Assistance: 5: Stand by assistance Static Standing Balance Static Standing - Balance Support: No upper extremity supported Static Standing - Level of Assistance: 4: Min assist Dynamic Standing Balance Dynamic Standing - Balance Support: No upper extremity supported Dynamic Standing - Level of Assistance: 3: Mod assist Extremity/Trunk Assessment RUE Assessment RUE  Assessment: Exceptions to [facility] General Strength Comments: Limited by sternal precautions and hx of R shoulder weakness, overall functional LUE Assessment LUE Assessment: Exceptions to [facility] General Strength Comments: Limited by sternal precautions and hx of R shoulder weakness, overall functional  Care Tool Care Tool Self Care Eating   Eating Assist Level: Supervision/Verbal cueing    Oral Care    Oral Care Assist Level: Supervision/Verbal cueing    Bathing Bathing activity did not occur: Refused            Upper Body Dressing(including orthotics)   What is the patient wearing?: Pull over shirt   Assist Level: Supervision/Verbal cueing    Lower Body Dressing (excluding footwear)   What is the patient wearing?: Pants Assist for lower body dressing: Moderate Assistance - Patient 50 - 74%    Putting on/Taking off footwear   What is the patient wearing?: Non-skid slipper socks Assist for footwear: Minimal Assistance - Patient > 75%       Care Tool Toileting Toileting activity   Assist for toileting: Moderate Assistance - Patient 50 - 74%     Care Tool Bed Mobility Roll left and right activity   Roll left and right assist level: Minimal Assistance - Patient > 75%    Sit to lying activity   Sit to lying assist level: Minimal Assistance - Patient > 75%    Lying to sitting on side of bed activity   Lying to sitting on side of bed assist level: the ability to move from lying on the back to sitting on the side of the bed with no back support.: Minimal Assistance - Patient > 75%     Care Tool Transfers Sit to stand transfer   Sit to stand assist level: Moderate Assistance - Patient 50 - 74%    Chair/bed transfer   Chair/bed transfer assist level: Moderate Assistance - Patient 50 - 74%     Toilet transfer   Assist Level: Moderate Assistance - Patient 50 - 74%     Care Tool Cognition  Expression of Ideas and Wants Expression of Ideas and Wants: 4. Without difficulty  (complex and basic) - expresses complex messages without difficulty and with speech that is clear and easy to understand  Understanding Verbal and Non-Verbal Content Understanding Verbal and Non-Verbal Content: 4. Understands (complex and basic) - clear comprehension without cues or repetitions   Memory/Recall Ability Memory/Recall Ability : Current season;Location of own room;Staff names and faces;That he or she is in  a hospital/hospital unit   Refer to Care Plan for Long Term Goals  SHORT TERM GOAL WEEK 1 OT Short Term Goal 1 (Week 1): Pt will don pants with CGA OT Short Term Goal 2 (Week 1): Pt will complete sit <> stand with min A consistently OT Short Term Goal 3 (Week 1): Pt will complete toileting tasks with min A  Recommendations for other services: None    Skilled Therapeutic Intervention ADL ADL Eating: Independent Where Assessed-Eating: Chair Grooming: Supervision/safety Where Assessed-Grooming: Edge of bed Upper Body Bathing: Supervision/safety Where Assessed-Upper Body Bathing: Edge of bed Lower Body Bathing: Minimal assistance Where Assessed-Lower Body Bathing: Sitting at sink Upper Body Dressing: Supervision/safety Where Assessed-Upper Body Dressing: Sitting at sink Lower Body Dressing: Moderate assistance Where Assessed-Lower Body Dressing: Edge of bed Toileting: Moderate assistance Where Assessed-Toileting: Bedside Commode Toilet Transfer: Moderate assistance Toilet Transfer Method: Stand pivot Toilet Transfer Equipment: Bedside commode Tub/Shower Transfer: Unable to assess Mobility  Bed Mobility Bed Mobility: Sit to Supine;Supine to Sit Supine to Sit: Minimal Assistance - Patient > 75% Sit to Supine: Minimal Assistance - Patient > 75% Transfers Sit to Stand: Moderate Assistance - Patient 50-74% Stand to Sit: Minimal Assistance - Patient > 75%  Skilled OT evaluation completed with the creation of pt centered OT POC. Pt educated on condition, ELOS, rehab  expectations, and fall risk reduction strategies throughout session. Pt with difficulty standing from lower surfaces and adhering to sternal precautions. Discussed equipment recommendations for home. He required increased assist from w/c vs EOB. He completed 120 ft of functional mobility with mod A overall using RW. Worked on mini squats in standing with increased difficulty with mod A. Skilled monitoring of vitals throughout session to ensure hemodynamic and cardiorespiratory stability. Pt returned to his room and was left supine with all needs met, bed alarm set.    Discharge Criteria: Patient will be discharged from OT if patient refuses treatment 3 consecutive times without medical reason, if treatment goals not met, if there is a change in medical status, if patient makes no progress towards goals or if patient is discharged from hospital.  The above assessment, treatment plan, treatment alternatives and goals were discussed and mutually agreed upon: by patient  Crissie Reese 09/10/2022, 10:28 AM

## 2022-09-11 DIAGNOSIS — R5381 Other malaise: Principal | ICD-10-CM

## 2022-09-11 NOTE — Progress Notes (Signed)
Occupational Therapy Session Note  Patient Details  Name: Collin [name] MRN: [id number] Date of Birth: 1940-12-26  Today's Date: 09/11/2022 OT Individual Time: 2951-8841 OT Individual Time Calculation (min): 56 min    Short Term Goals: Week 1:  OT Short Term Goal 1 (Week 1): Pt will don pants with CGA OT Short Term Goal 2 (Week 1): Pt will complete sit <> stand with min A consistently OT Short Term Goal 3 (Week 1): Pt will complete toileting tasks with min A  Skilled Therapeutic Interventions/Progress Updates:  Pt greeted seated in recliner, able to state 1 sternal precaution but needed education on all sternal precautions. Pt completed sit>stand from recliner with MIN A but needed 2 tries to come into standing d/t posterior lean. Pt completed stand pivot transfer to w/c with RW and MINA. Total A transport to gym for time mgmt.   Pt completed sit>stand therapeutic activity with pt completing X7 sit>stands to engage in connect 4 game, pt required [facility] for sit>stands with an emphasis on anterior weight shifts and hand placement during sit>stands d/t sternal precautions.   Pt completed ~ 10 ft of functional ambulation to mat table with Rw and MINA. Worked on increasing overall strength and cardiopulmonary endurance with pt completing Toe taps with BUE support for 2 mins with CGA to facilitate improved endurance, SpO2 93% HR 75 bpm. Pt additionally completed Reciprocal stepping with BUE support with MINA, MAX cues for sequence of stepping, HR 74 bpm.   Pt completed functional ambulation back to room with RW and CGA with close chair follow. Ended session with pt seated in recliner with all needs within reach and safety belt activated.   Therapy Documentation Precautions:  Precautions Precautions: Fall, Sternal Precaution Booklet Issued: Yes (comment) Precaution Comments: educated pt on sternal precautions Other Brace: wears L foot brace for h/o foot drop Restrictions Weight Bearing  Restrictions: No RUE Weight Bearing: Weight bearing as tolerated LUE Weight Bearing: Weight bearing as tolerated Other Position/Activity Restrictions: sternal precautions  Pain: No pain reported during session    Therapy/Group: Individual Therapy  Pollyann Glen [facility] 09/11/2022, 3:42 PM

## 2022-09-11 NOTE — Progress Notes (Signed)
Occupational Therapy Session Note  Patient Details  Name: Collin [name] MRN: [id number] Date of Birth: 1940/03/16  Today's Date: 09/11/2022 OT Individual Time: 4098-1191 OT Individual Time Calculation (min): 57 min    Short Term Goals: Week 1:  OT Short Term Goal 1 (Week 1): Pt will don pants with CGA OT Short Term Goal 2 (Week 1): Pt will complete sit <> stand with min A consistently OT Short Term Goal 3 (Week 1): Pt will complete toileting tasks with min A  Skilled Therapeutic Interventions/Progress Updates:    OT session focused on sit<>stand, standing balance, and endurance. Upon arrival, pt sitting in recliner verbalizing fatigue, however willing to participate in therapy. Pt unable to independently recall any sternal precautions, however once reminded he was able to implement throughout session. Completed sit<>stand 8x during horse shoes game focusing on achieving balance during that transition. Pt completed with min-CGA showing most difficulty with posterior lean at upright position. Pt demonstrates protective response via stepping back with LLE to correct. Attempted staggered position, however no change noted. Once in standing, pt required SBA for balance. At end of session, pt returned to room and left with all needs in reach.  Pt reported unaware of BP changes. Discussed BP in relation to transition to standing and dizziness. Pt with no report of dizziness during standing on this date.   Therapy Documentation Precautions:  Precautions Precautions: Fall, Sternal Precaution Booklet Issued: Yes (comment) Precaution Comments: educated pt on sternal precautions Other Brace: wears L foot brace for h/o foot drop Restrictions Weight Bearing Restrictions: No RUE Weight Bearing: Weight bearing as tolerated LUE Weight Bearing: Weight bearing as tolerated Other Position/Activity Restrictions: sternal precautions General:   Vital Signs:   Pain: Pain Assessment Pain Scale:  0-10 Pain Score: 0-No pain ADL: ADL Eating: Independent Where Assessed-Eating: Chair Grooming: Supervision/safety Where Assessed-Grooming: Edge of bed Upper Body Bathing: Supervision/safety Where Assessed-Upper Body Bathing: Edge of bed Lower Body Bathing: Minimal assistance Where Assessed-Lower Body Bathing: Sitting at sink Upper Body Dressing: Supervision/safety Where Assessed-Upper Body Dressing: Sitting at sink Lower Body Dressing: Moderate assistance Where Assessed-Lower Body Dressing: Edge of bed Toileting: Moderate assistance Where Assessed-Toileting: Bedside Commode Toilet Transfer: Moderate assistance Toilet Transfer Method: Stand pivot Toilet Transfer Equipment: Bedside commode Tub/Shower Transfer: Unable to assess Vision   Perception    Praxis   Balance   Exercises:   Other Treatments:     Therapy/Group: Individual Therapy  [name] 09/11/2022, 11:54 AM

## 2022-09-11 NOTE — IPOC Note (Signed)
Overall Plan of Care [facility]) Patient Details Name: Collin [name] MRN: [id number] DOB: May 22, 1940  Admitting Diagnosis: Debility  Hospital Problems: Principal Problem:   Debility Active Problems:   STEMI (ST elevation myocardial infarction) (HCC)   S/P CABG x 3     Functional Problem List: Nursing Bladder, Bowel, Safety, Edema, Skin Integrity, Endurance  PT Balance, Endurance, Motor, Safety, Sensory  OT Balance, Safety, Endurance, Motor, Pain  SLP    TR         Basic ADL's: OT Bathing, Dressing, Toileting     Advanced  ADL's: OT       Transfers: PT Bed Mobility, Bed to Chair, Customer service manager, Tub/Shower     Locomotion: PT Ambulation, Stairs, Wheelchair Mobility     Additional Impairments: OT None  SLP        TR      Anticipated Outcomes Item Anticipated Outcome  Self Feeding no goal  Swallowing      Basic self-care  (S)  Toileting  (S)   Bathroom Transfers (S)  Bowel/Bladder  continent of bowel/bladder  Transfers  supervision  Locomotion  supervision  Communication     Cognition     Pain  less than 3  Safety/Judgment  no falls and midline incision remain intact   Therapy Plan: PT Intensity: Minimum of 1-2 x/day ,45 to 90 minutes PT Frequency: 5 out of 7 days PT Duration Estimated Length of Stay: 10-14 days OT Intensity: Minimum of 1-2 x/day, 45 to 90 minutes OT Frequency: 5 out of 7 days OT Duration/Estimated Length of Stay: 10-14 days     Team Interventions: Nursing Interventions Patient/Family Education, Pain Management, Bladder Management, Bowel Management, Medication Management, Discharge Planning, Skin Care/Wound Management, Disease Management/Prevention  PT interventions Ambulation/gait training, Balance/vestibular training, Cognitive remediation/compensation, Community reintegration, Discharge planning, Disease management/prevention, DME/adaptive equipment instruction, Functional electrical stimulation, Functional mobility  training, Neuromuscular re-education, Pain management, Patient/family education, Stair training, Therapeutic Activities, Therapeutic Exercise, Splinting/orthotics, UE/LE Strength taining/ROM, UE/LE Coordination activities, Wheelchair propulsion/positioning  OT Interventions Balance/vestibular training, Discharge planning, Pain management, Self Care/advanced ADL retraining, Therapeutic Activities, UE/LE Coordination activities, Therapeutic Exercise, Functional mobility training, Patient/family education, Community reintegration, DME/adaptive equipment instruction, UE/LE Strength taining/ROM, Psychosocial support  SLP Interventions    TR Interventions    SW/CM Interventions Discharge Planning, Psychosocial Support, Patient/Family Education, Disease Management/Prevention   Barriers to Discharge MD   cognition  Nursing Decreased caregiver support, Home environment access/layout, Wound Care, Weight bearing restrictions home with wife 2 level 3 ste entry rail on right.  B/B main level  PT      OT      SLP      SW Lack of/limited family support, Community education officer for SNF coverage     Team Discharge Planning: Destination: PT-Home ,OT- Home , SLP-  Projected Follow-up: PT-Outpatient PT (cardiopulm rehab), OT-  Outpatient OT, SLP-  Projected Equipment Needs: PT-Rolling walker with 5" wheels, 3 in 1 bedside comode, Tub/shower seat, OT- 3 in 1 bedside comode, SLP-  Equipment Details: PT-pt does not have any DME, OT-  Patient/family involved in discharge planning: PT- Patient,  OT-Patient, SLP-   MD ELOS: 7-10d Medical Rehab Prognosis:  Good Assessment: The patient has been admitted for CIR therapies with the diagnosis of debility after CABG. The team will be addressing functional mobility, strength, stamina, balance, safety, adaptive techniques and equipment, self-care, bowel and bladder mgt, patient and caregiver education, confusion . Goals have been set at sup. Anticipated discharge destination is home  .  See Team Conference Notes for weekly updates to the plan of care

## 2022-09-11 NOTE — Progress Notes (Signed)
PROGRESS NOTE   Subjective/Complaints:  Pt states he has had memory problems since CABG ROS: no CP, Mild DOE, no N/V/abd pain, no jt pain   Objective:   No results found. Recent Labs    09/10/22 0620  WBC 10.0  HGB 10.1*  HCT 30.5*  PLT 332   Recent Labs    09/09/22 1700 09/10/22 0620  NA 135 134*  K 4.2 4.1  CL 95* 100  CO2 27 26  GLUCOSE 129* 111*  BUN 23 21  CREATININE 1.60* 1.38*  CALCIUM 8.8* 8.6*    Intake/Output Summary (Last 24 hours) at 09/11/2022 1355 Last data filed at 09/11/2022 0842 Gross per 24 hour  Intake 240 ml  Output 1050 ml  Net -810 ml        Physical Exam: Vital Signs Blood pressure 132/68, pulse 72, temperature 98.5 F (36.9 C), temperature source Oral, resp. rate 18, height 6\' 1"  (1.854 m), weight 87.4 kg, SpO2 95%.  General: No acute distress Mood and affect are appropriate Heart: Regular rate and rhythm no rubs murmurs or extra sounds Lungs: Clear to auscultation, breathing unlabored, no rales or wheezes Abdomen: Positive bowel sounds, soft nontender to palpation, nondistended Extremities: No clubbing, cyanosis, or edema Skin: No evidence of breakdown, no evidence of rash Musculoskeletal:        General: No swelling.     Cervical back: Neck supple. No tenderness.     Comments: UE strength 5-/5 B/L- painful with R deltoid- but still 4+/5 Les- HF 5-/5; KE 5-/5; R DF and PF 5-/5 L DF 3+/5 and PF 5-/5   Skin:    General: Skin is warm and dry.     Comments: Sternal incision looks great Incision on R leg x2- near groin and R calf- look good Severe purple bruising posterior proximal Thigh on R 2 IV's LUE- look OK  Neurological:     General: No focal deficit present.     Mental Status: He is alert and oriented to person, place, and time.     Comments: Decreased to light touch from knees to toes B/L- worse on LLE Higher level cognition- slightly delayed responses- even with  repetition-  Cannot follow 3 step command, but can 1 step and slowed on 2 step command.   Psychiatric:        Mood and Affect: Mood normal.        Behavior: Behavior normal.   Assessment/Plan: 1. Functional deficits which require 3+ hours per day of interdisciplinary therapy in a comprehensive inpatient rehab setting. Physiatrist is providing close team supervision and 24 hour management of active medical problems listed below. Physiatrist and rehab team continue to assess barriers to discharge/monitor patient progress toward functional and medical goals  Care Tool:  Bathing  Bathing activity did not occur: Refused           Bathing assist       Upper Body Dressing/Undressing Upper body dressing   What is the patient wearing?: Pull over shirt    Upper body assist Assist Level: Supervision/Verbal cueing    Lower Body Dressing/Undressing Lower body dressing      What is the patient wearing?: Pants  Lower body assist Assist for lower body dressing: Moderate Assistance - Patient 50 - 74%     Toileting Toileting    Toileting assist Assist for toileting: Moderate Assistance - Patient 50 - 74%     Transfers Chair/bed transfer  Transfers assist     Chair/bed transfer assist level: Moderate Assistance - Patient 50 - 74%     Locomotion Ambulation   Ambulation assist      Assist level: Minimal Assistance - Patient > 75% Assistive device: Walker-rolling Max distance: 160   Walk 10 feet activity   Assist     Assist level: Minimal Assistance - Patient > 75% Assistive device: Walker-rolling   Walk 50 feet activity   Assist    Assist level: Minimal Assistance - Patient > 75% Assistive device: Walker-rolling    Walk 150 feet activity   Assist    Assist level: Minimal Assistance - Patient > 75% Assistive device: Walker-rolling    Walk 10 feet on uneven surface  activity   Assist     Assist level: Minimal Assistance - Patient >  75% Assistive device: Walker-rolling   Wheelchair     Assist Is the patient using a wheelchair?: Yes Type of Wheelchair: Manual    Wheelchair assist level: Contact Guard/Touching assist Max wheelchair distance: 50    Wheelchair 50 feet with 2 turns activity    Assist        Assist Level: Contact Guard/Touching assist   Wheelchair 150 feet activity     Assist      Assist Level: Maximal Assistance - Patient 25 - 49%   Blood pressure 132/68, pulse 72, temperature 98.5 F (36.9 C), temperature source Oral, resp. rate 18, height 6\' 1"  (1.854 m), weight 87.4 kg, SpO2 95%.    Medical Problem List and Plan: 1. Functional deficits secondary to debility due to CABG x3 due to STEMI             -patient may  shower-cover incisions             -ELOS/Goals: 7-10 days mod I             Grounds pass ordered   2.  Antithrombotics: -DVT/anticoagulation:  Pharmaceutical: Lovenox start renal dose Lovenox             -antiplatelet therapy: Aspirin and Plavix    3. Pain Management: Tylenol as needed   4. Mood/Behavior/Sleep: LCSW to evaluate and provide emotional support             -antipsychotic agents: n/a   5. Neuropsych/cognition: This patient is? capable of making decisions on his own behalf.   6. Skin/Wound Care: Routine skin care checks             -monitor chest and leg incisions   7. Fluids/Electrolytes/Nutrition: Routine Is and Os and follow-up chemistries             -continue potassium supplement   8: Hypertension: monitor TID and prn (meds see #10)   9: Hyperlipidemia: continue statin   10: CAD s/p CABG 8/07:             -sternal precautions             -continue amiodarone              -on aspirin and Plavix             -continue Lopressor             -follow-up  with Dr. Leafy Ro   11: BPH: continue Flomax   12: ABLA: follow-up CBC   13: Volume overload: TEE with normal LV function and EF ~60-65% -continue diuresis with Lasix 40 mg daily              -monitor volume status/daily weight   -weight reviewed and is 86.2 kg on 8/15, asked nursing to weigh patient 8/16 14: AKI: improving; follow-up BMP             -encourage PO fluids (not on FR, but getting Lasix)  -discussed that creatinine is improving.     Latest Ref Rng & Units 09/10/2022    6:20 AM 09/09/2022    5:00 PM 09/07/2022    1:33 AM  BMP  Glucose 70 - 99 mg/dL 782  956  213   BUN 8 - 23 mg/dL 21  23  19    Creatinine 0.61 - 1.24 mg/dL 0.86  5.78  4.69   Sodium 135 - 145 mmol/L 134  135  134   Potassium 3.5 - 5.1 mmol/L 4.1  4.2  3.8   Chloride 98 - 111 mmol/L 100  95  100   CO2 22 - 32 mmol/L 26  27  25    Calcium 8.9 - 10.3 mg/dL 8.6  8.8  8.2      15: Prediabetes: continue carb modified diet, d/c CBG checks and insulin   16: pAF: continue amiodarone 400 mg Bid for two days, then 200 mg BID for seven days then continue on 200 mg daily at discharge             -follow-up with Dr. Dayle Points  17. Higher level cognitive issues complicated by [facility]- pt wants ot wait on SLP order for now.    18. Constipation- if no BM by tomorrow, suggest Sorbitol, messaged nursing to see if he had a BM, last listed is 8/13.       LOS: 2 days A FACE TO FACE EVALUATION WAS PERFORMED  Erick Colace 09/11/2022, 1:55 PM

## 2022-09-11 NOTE — Progress Notes (Signed)
D/C CBG's per Dr. Lonell Face order.

## 2022-09-11 NOTE — Progress Notes (Signed)
Physical Therapy Session Note  Patient Details  Name: Collin [name] MRN: [id number] Date of Birth: January 10, 1941  Today's Date: 09/11/2022 PT Individual Time: 1103-1158 PT Individual Time Calculation (min): 55 min   Short Term Goals: Week 1:  PT Short Term Goal 1 (Week 1): Patient will complete sit<>stand with CGA. PT Short Term Goal 1 - Progress (Week 1): Progressing toward goal PT Short Term Goal 2 (Week 1): Pt will ambulate >150' with CGA with RW on level surfaces. PT Short Term Goal 2 - Progress (Week 1): Progressing toward goal PT Short Term Goal 3 (Week 1): Patient will recall and verbalize sternal precautions.  Skilled Therapeutic Interventions/Progress Updates:    Chart reviewed and pt agreeable to therapy. Pt received seated in recliner with no c/o pain. Session focused on sit to stand technique in setting of sternal precautions to promote independence towards home mobility. Pt initiated session with sit to stand using modA + noAD. Pt then completed amb to WC using MinA + Rw and pt taken to therapy gym for time management. In gym, pt completed multiple rounds of sit to stand practice with extensive education on body mechanics and sequencing for energy conservation and safety. Pt educated to scoot to edge of WC, tuck BLE, and lean nose forward before standing. Pt able to fade from ModA + no AD to CGA + no AD with sequencing stated, although inconsistently with periodic need for MinA. Pt also practice amb of 10ft with CGA + RW. Once returned to room, pt demonstrated sit to stand set up and stood with CGA in setting pf sternal precautoins. Pt amb to recliner with CGA + R. At end of session, pt was left seated in recliner with alarm engaged, nurse call bell and all needs in reach.     Therapy Documentation Precautions:  Precautions Precautions: Fall, Sternal Precaution Booklet Issued: Yes (comment) Precaution Comments: educated pt on sternal precautions Other Brace: wears L foot brace  for h/o foot drop Restrictions Weight Bearing Restrictions: No RUE Weight Bearing: Weight bearing as tolerated LUE Weight Bearing: Weight bearing as tolerated Other Position/Activity Restrictions: sternal precautions General:      Therapy/Group: Individual Therapy  Dionne Milo, PT, DPT 09/11/2022, 12:14 PM

## 2022-09-11 NOTE — Progress Notes (Signed)
Physical Therapy Session Note  Patient Details  Name: Collin [name] MRN: [id number] Date of Birth: Oct 13, 1940  Today's Date: 09/11/2022 PT Individual Time: 1445-1530 PT Individual Time Calculation (min): 45 min   Short Term Goals: Week 1:  PT Short Term Goal 1 (Week 1): Patient will complete sit<>stand with CGA. PT Short Term Goal 1 - Progress (Week 1): Progressing toward goal PT Short Term Goal 2 (Week 1): Pt will ambulate >150' with CGA with RW on level surfaces. PT Short Term Goal 2 - Progress (Week 1): Progressing toward goal PT Short Term Goal 3 (Week 1): Patient will recall and verbalize sternal precautions.  Skilled Therapeutic Interventions/Progress Updates: Pt presents semi-reclined in recliner and agreeable to therapy.  Pt required mod A for sit to stand from low recliner and amb w/ RW to w/c.  Pt wheeled to dayroom for energy and time conservation.  Pt recalled 1/3 sternal precautions and educated throughout session for repetition.  Pt transfers sit to stand from w/c w/ cueing and light mod to heavy min A throughout session.  Pt able to remember sequence to improve transfers of scoot forward bring feet back and lean forward.  Pt amb x 120' x 2 w/ RW including turns w/ CGA to min A, verbal cues for posture , decreasing WB through UES and maintaining BOS w/ turns.  Pt amb x 2 w/ L HHA and min A x 120 x 2 cueing for increased BOS, safe turns and approach to seat (tends to turn and then sidestep to front of w/c.  Pt amb x 120' to room and transfers sit to sidelying and supine w/ min A but cues for sequencing.  Pt able to scoot to [facility] w/ hooklying position and bed in Trendelenburg w/ min A from PT.  Bed alarm on and all needs in reach.     Therapy Documentation Precautions:  Precautions Precautions: Fall, Sternal Precaution Booklet Issued: Yes (comment) Precaution Comments: educated pt on sternal precautions Other Brace: wears L foot brace for h/o foot drop Restrictions Weight  Bearing Restrictions: No RUE Weight Bearing: Weight bearing as tolerated LUE Weight Bearing: Weight bearing as tolerated Other Position/Activity Restrictions: sternal precautions General:   Vital Signs: Therapy Vitals Temp: 98.2 F (36.8 C) Temp Source: Oral Pulse Rate: 68 Resp: 18 BP: 115/64 Patient Position (if appropriate): Sitting Oxygen Therapy SpO2: 98 % O2 Device: Room Air Pain:0/10      Therapy/Group: Individual Therapy  Lucio Edward 09/11/2022, 4:19 PM

## 2022-09-12 NOTE — Progress Notes (Addendum)
PROGRESS NOTE   Subjective/Complaints:  RN concerned about RIght upper medial thigh bruising .  Pt denies pain  ROS: no CP, Mild DOE, no N/V/abd pain, no jt pain   Objective:   No results found. Recent Labs    09/10/22 0620  WBC 10.0  HGB 10.1*  HCT 30.5*  PLT 332   Recent Labs    09/09/22 1700 09/10/22 0620  NA 135 134*  K 4.2 4.1  CL 95* 100  CO2 27 26  GLUCOSE 129* 111*  BUN 23 21  CREATININE 1.60* 1.38*  CALCIUM 8.8* 8.6*    Intake/Output Summary (Last 24 hours) at 09/12/2022 9629 Last data filed at 09/12/2022 0929 Gross per 24 hour  Intake 480 ml  Output 1400 ml  Net -920 ml        Physical Exam: Vital Signs Blood pressure 120/71, pulse 66, temperature 98 F (36.7 C), temperature source Oral, resp. rate 18, height 6\' 1"  (1.854 m), weight 86.2 kg, SpO2 94%.  General: No acute distress Mood and affect are appropriate Heart: Regular rate and rhythm no rubs murmurs or extra sounds Lungs: Clear to auscultation, breathing unlabored, no rales or wheezes Abdomen: Positive bowel sounds, soft nontender to palpation, nondistended Extremities: No clubbing, cyanosis, or edema Skin: No evidence of breakdown, no evidence of rash Musculoskeletal:        General: No swelling.     Cervical back: Neck supple. No tenderness.     Comments: UE strength 5-/5 B/L- painful with R deltoid- but still 4+/5 Les- HF 5-/5; KE 5-/5; R DF and PF 5-/5 L DF 3+/5 and PF 5-/5   Skin:    General: Skin is warm and dry.     Comments: Sternal incision looks great Incision on R leg x2- near groin and R calf- look good Severe purple bruising posterior proximal Thigh on R- medial scab over the saphenous v region , no induration no eythema no tenderness to palpation  2 IV's LUE- look OK  Neurological:     General: No focal deficit present.     Mental Status: He is alert and oriented to person, place, and time.     Comments:  Decreased to light touch from knees to toes B/L- worse on LLE Higher level cognition- slightly delayed responses- even with repetition-  Cannot follow 3 step command, but can 1 step and slowed on 2 step command.   Psychiatric:        Mood and Affect: Mood normal.        Behavior: Behavior normal.   Assessment/Plan: 1. Functional deficits which require 3+ hours per day of interdisciplinary therapy in a comprehensive inpatient rehab setting. Physiatrist is providing close team supervision and 24 hour management of active medical problems listed below. Physiatrist and rehab team continue to assess barriers to discharge/monitor patient progress toward functional and medical goals  Care Tool:  Bathing  Bathing activity did not occur: Refused           Bathing assist       Upper Body Dressing/Undressing Upper body dressing   What is the patient wearing?: Pull over shirt    Upper body assist Assist Level:  Supervision/Verbal cueing    Lower Body Dressing/Undressing Lower body dressing      What is the patient wearing?: Pants     Lower body assist Assist for lower body dressing: Moderate Assistance - Patient 50 - 74%     Toileting Toileting    Toileting assist Assist for toileting: Moderate Assistance - Patient 50 - 74%     Transfers Chair/bed transfer  Transfers assist     Chair/bed transfer assist level: Moderate Assistance - Patient 50 - 74%     Locomotion Ambulation   Ambulation assist      Assist level: Minimal Assistance - Patient > 75% Assistive device: Walker-rolling Max distance: 130   Walk 10 feet activity   Assist     Assist level: Minimal Assistance - Patient > 75% Assistive device: Walker-rolling   Walk 50 feet activity   Assist    Assist level: Minimal Assistance - Patient > 75% Assistive device: Walker-rolling    Walk 150 feet activity   Assist    Assist level: Minimal Assistance - Patient > 75% Assistive device:  Walker-rolling    Walk 10 feet on uneven surface  activity   Assist     Assist level: Minimal Assistance - Patient > 75% Assistive device: Walker-rolling   Wheelchair     Assist Is the patient using a wheelchair?: Yes Type of Wheelchair: Manual    Wheelchair assist level: Contact Guard/Touching assist Max wheelchair distance: 50    Wheelchair 50 feet with 2 turns activity    Assist        Assist Level: Contact Guard/Touching assist   Wheelchair 150 feet activity     Assist      Assist Level: Maximal Assistance - Patient 25 - 49%   Blood pressure 120/71, pulse 66, temperature 98 F (36.7 C), temperature source Oral, resp. rate 18, height 6\' 1"  (1.854 m), weight 86.2 kg, SpO2 94%.    Medical Problem List and Plan: 1. Functional deficits secondary to debility due to CABG x3 due to STEMI             -patient may  shower-cover incisions             -ELOS/Goals: 7-10 days mod I             Grounds pass ordered   2.  Antithrombotics: -DVT/anticoagulation:  Pharmaceutical: Lovenox start renal dose Lovenox             -antiplatelet therapy: Aspirin and Plavix    3. Pain Management: Tylenol as needed   4. Mood/Behavior/Sleep: LCSW to evaluate and provide emotional support             -antipsychotic agents: n/a   5. Neuropsych/cognition: This patient is? capable of making decisions on his own behalf.   6. Skin/Wound Care: Routine skin care checks             -monitor chest and leg incisions, echymosis no evidence of infection R thigh    7. Fluids/Electrolytes/Nutrition: Routine Is and Os and follow-up chemistries             -continue potassium supplement   8: Hypertension: monitor TID and prn (meds see #10)   9: Hyperlipidemia: continue statin   10: CAD s/p CABG 8/07:             -sternal precautions             -continue amiodarone              -  on aspirin and Plavix             -continue Lopressor             -follow-up with Dr. Leafy Ro    11: BPH: continue Flomax   12: ABLA: follow-up CBC   13: Volume overload: TEE with normal LV function and EF ~60-65% -continue diuresis with Lasix 40 mg daily             -monitor volume status/daily weight   -weight reviewed and is 86.2 kg on 8/15, asked nursing to weigh patient 8/16 14: AKI: improving; follow-up BMP             -encourage PO fluids (not on FR, but getting Lasix)  -discussed that creatinine is improving.     Latest Ref Rng & Units 09/10/2022    6:20 AM 09/09/2022    5:00 PM 09/07/2022    1:33 AM  BMP  Glucose 70 - 99 mg/dL 166  063  016   BUN 8 - 23 mg/dL 21  23  19    Creatinine 0.61 - 1.24 mg/dL 0.10  9.32  3.55   Sodium 135 - 145 mmol/L 134  135  134   Potassium 3.5 - 5.1 mmol/L 4.1  4.2  3.8   Chloride 98 - 111 mmol/L 100  95  100   CO2 22 - 32 mmol/L 26  27  25    Calcium 8.9 - 10.3 mg/dL 8.6  8.8  8.2      15: Prediabetes: continue carb modified diet, d/c CBG checks and insulin   16: pAF: continue amiodarone 400 mg Bid for two days, then 200 mg BID for seven days then continue on 200 mg daily at discharge             -follow-up with Dr. Dayle Points  17. Higher level cognitive issues complicated by [facility]- pt wants ot wait on SLP order for now.    18. Constipation- if no BM by tomorrow, suggest Sorbitol, messaged nursing to see if he had a BM, last listed is 8/13.       LOS: 3 days A FACE TO FACE EVALUATION WAS PERFORMED  Erick Colace 09/12/2022, 9:52 AM

## 2022-09-13 ENCOUNTER — Ambulatory Visit: Payer: PPO

## 2022-09-13 LAB — CBC
HCT: 31.3 % — ABNORMAL LOW (ref 39.0–52.0)
Hemoglobin: 10.3 g/dL — ABNORMAL LOW (ref 13.0–17.0)
MCH: 28.4 pg (ref 26.0–34.0)
MCHC: 32.9 g/dL (ref 30.0–36.0)
MCV: 86.2 fL (ref 80.0–100.0)
Platelets: 456 10*3/uL — ABNORMAL HIGH (ref 150–400)
RBC: 3.63 MIL/uL — ABNORMAL LOW (ref 4.22–5.81)
RDW: 13.2 % (ref 11.5–15.5)
WBC: 11.5 10*3/uL — ABNORMAL HIGH (ref 4.0–10.5)
nRBC: 0 % (ref 0.0–0.2)

## 2022-09-13 MED ORDER — METOPROLOL SUCCINATE ER 25 MG PO TB24
12.5000 mg | ORAL_TABLET | Freq: Every day | ORAL | Status: DC
  Administered 2022-09-13 – 2022-09-23 (×11): 12.5 mg via ORAL
  Filled 2022-09-13 (×11): qty 1

## 2022-09-13 NOTE — Discharge Instructions (Addendum)
Inpatient Rehab Discharge Instructions  Thanh Winger Nowling Discharge date and time:  09/24/2022  Activities/Precautions/ Functional Status: Activity: no lifting, driving, or strenuous exercise until cleared by MD; continue sternal precautions Diet: cardiac diet Wound Care: keep wound clean and dry Functional status:  ___ No restrictions     ___ Walk up steps independently ___ 24/7 supervision/assistance   ___ Walk up steps with assistance _x__ Intermittent supervision/assistance  ___ Bathe/dress independently ___ Walk with walker     ___ Bathe/dress with assistance ___ Walk Independently    ___ Shower independently ___ Walk with assistance    __x_ Shower with assistance __x_ No alcohol     ___ Return to work/school ________  Special Instructions: No driving, alcohol consumption or tobacco use.  Recommend daily BP measurement in same arm and record time of day. Bring this information with you to follow-up appointment with PCP.   COMMUNITY REFERRALS UPON DISCHARGE:    Outpatient: PT  &  OT             Agency:[facility]  [address] [place] [address] Phone:[phone]              Appointment Date/Time:WILL CALL TO SET UP FOLLOW UP APPOINTMENTS  Medical Equipment/Items Ordered:ROLLING WALKER AND 3 IN 1  WILL GET CANE ON OWN SINCE NOT COVERED                                                 Agency/Supplier: [facility]   [phone]  WILL GET TUB SEAT ON OWN   My questions have been answered and I understand these instructions. I will adhere to these goals and the provided educational materials after my discharge from the hospital.  Patient/Caregiver Signature _______________________________ Date __________  Clinician Signature _______________________________________ Date __________  Please bring this form and your medication list with you to all your follow-up doctor's appointments.

## 2022-09-13 NOTE — Discharge Summary (Signed)
Physician Discharge Summary  Patient ID: Collin [name] MRN: [id number] DOB/AGE: November 11, 1940 82 y.o.  Admit date: 09/09/2022 Discharge date: 09/24/2022  Discharge Diagnoses:  Principal Problem:   Debility Active Problems:   STEMI (ST elevation myocardial infarction) (HCC)   S/P CABG x 3   Orthostatic hypotension Hypertension Hyperlipidemia BPH Acute blood loss anemia  volume overload acute kidney injury Prediabetes Paroxysmal atrial fibrillation Higher level cognitive issues Hard of hearing Constipation Orthostatic hypotension  Discharged Condition: stable  Significant Diagnostic Studies: none  Labs:  Basic Metabolic Panel:    Latest Ref Rng & Units 09/10/2022    6:20 AM 09/09/2022    5:00 PM 09/07/2022    1:33 AM  BMP  Glucose 70 - 99 mg/dL 782  956  213   BUN 8 - 23 mg/dL 21  23  19    Creatinine 0.61 - 1.24 mg/dL 0.86  5.78  4.69   Sodium 135 - 145 mmol/L 134  135  134   Potassium 3.5 - 5.1 mmol/L 4.1  4.2  3.8   Chloride 98 - 111 mmol/L 100  95  100   CO2 22 - 32 mmol/L 26  27  25    Calcium 8.9 - 10.3 mg/dL 8.6  8.8  8.2      CBC:    Latest Ref Rng & Units 09/13/2022    6:20 AM 09/10/2022    6:20 AM 09/07/2022    1:33 AM  CBC  WBC 4.0 - 10.5 K/uL 11.5  10.0  8.3   Hemoglobin 13.0 - 17.0 g/dL 62.9  52.8  9.4   Hematocrit 39.0 - 52.0 % 31.3  30.5  28.5   Platelets 150 - 400 K/uL 456  332  205      Brief HPI:   Collin [name] is a 82 y.o. male with a history of CAD who was working out at his gym on 09/01/2022 during a usual work-out (performs three days a week) and developed onset of chest tightness. He drove home, took aspirin 325 mg, but when pain did not subside he called EMS and was brought to [facility] ED. The ECG that was done by EMS personnel and demonstrated inferior lead ST elevation with anterior lead depression. Dr. Eldridge Dace was consulted and was taken for emergency heart catheterization. Acute inferior MI due to severe ostial circumflex and distal left  main stenosis. Severe mid LAD disease and ostial second diagonal disease. Patent RCA. Normal LV by LV gram with LVEDP of 21. Given the anatomy, plan for emergent bypass surgery. IABP was placed in the right femoral artery. The patient was also started on IV nitroglycerin. Dr. Leafy Ro evaluated the patient and he underwent CABG times 3 vessels using left internal mammary artery and right saphenous vein. Extubated on 8/08. Seen by Dr. Izora Ribas on 8/08>>wean IABP, amiodarone for prevention--no evidence of AF. Later had evidence of pAF and amiodarone continues with taper. Patient is able to stand without LOB with min A. Standing rest break required during ambulation due to fatigue. Tolerating diet.    Hospital Course: Collin [name] was admitted to rehab 09/09/2022 for inpatient therapies to consist of PT, ST and OT at least three hours five days a week. Past admission physiatrist, therapy team and rehab RN have worked together to provide customized collaborative inpatient rehab.  Follow-up labs 8/16 with improvement in creatinine to 1.38 and improvement in hemoglobin to 10.1.  Dizziness with standing and positive orthostatic hypotension.  Thigh-high TED hose and abdominal binder placed.  Lopressor changed to Toprol-XL at bedtime. Consulted with cardiology regarding adjusting medications in regards to orthostatic hypotension, no presyncope. Lasix discontinued.  Recommended that he should push fluids and can be liberal with sodium until his symptoms improve. Amiodarone weaned down to 200 mg daily on 8/24. Dr. Izora Ribas visited 8/25 with final recommendations. Continue Plavix and aspirin. Statin changed to Lipitor 80 mg daily.  Blood pressures were monitored on TID basis and amiodarone (wean down) and Lopressor continued.  Adjusted Lopressor to Toprol-XL at bedtime due to orthostatic hypotension 8/19.  Rehab course: During patient's stay in rehab weekly team conferences were held to monitor patient's  progress, set goals and discuss barriers to discharge. At admission, patient required min-mod with mobility and  mod with basic self-care skills.  He has had improvement in activity tolerance, balance, postural control as well as ability to compensate for deficits. He has had improvement in functional use RUE/LUE  and RLE/LLE as well as improvement in awareness. Pt ambulated x~400 ft with RW and close supervision-CGA d/t new RW which sticks d/t new rubber feet. Advised pt he could use tennis balls or get different glides from a pharmacy for improved glide. Cues for upright posture and RW proximity.   Outpatient PT and OT arranged.  Discharge disposition: 01-Home or Self Care     Diet: Heart healthy  Special Instructions: No driving, alcohol consumption or tobacco use.  30-35 minutes were spent on discharge planning and discharge summary.  Discharge Instructions     Discharge patient   Complete by: As directed    Discharge disposition: 01-Home or Self Care   Discharge patient date: 09/24/2022      Allergies as of 09/24/2022       Reactions   Penicillins Rash   Immediate rash        Medication List     STOP taking these medications    CALCIUM + VITAMIN D3 PO   furosemide 40 MG tablet Commonly known as: LASIX   potassium chloride SA 20 MEQ tablet Commonly known as: KLOR-CON M   simvastatin 40 MG tablet Commonly known as: ZOCOR   traMADol 50 MG tablet Commonly known as: ULTRAM       TAKE these medications    acetaminophen 325 MG tablet Commonly known as: TYLENOL Take 1-2 tablets (325-650 mg total) by mouth every 4 (four) hours as needed for mild pain.   amiodarone 200 MG tablet Commonly known as: PACERONE Take 1 tablet (200 mg total) by mouth daily. What changed:  how much to take how to take this when to take this additional instructions   ascorbic acid 500 MG tablet Commonly known as: VITAMIN C Take 0.5 tablets (250 mg total) by mouth daily. What  changed:  medication strength how much to take   aspirin 81 MG tablet Take 1 tablet (81 mg total) by mouth at bedtime. What changed: when to take this   atorvastatin 80 MG tablet Commonly known as: LIPITOR Take 1 tablet (80 mg total) by mouth daily.   clopidogrel 75 MG tablet Commonly known as: PLAVIX Take 1 tablet (75 mg total) by mouth daily.   melatonin 3 MG Tabs tablet Take 1 tablet (3 mg total) by mouth at bedtime.   Mens 50+ Multivitamin Tabs Take 1 tablet by mouth daily.   metoprolol succinate 25 MG 24 hr tablet Commonly known as: TOPROL-XL Take 0.5 tablets (12.5 mg total) by mouth at bedtime. What changed: See the new instructions.   nystatin powder Commonly  known as: MYCOSTATIN/NYSTOP Apply topically 2 (two) times daily.   polyethylene glycol 17 g packet Commonly known as: MIRALAX / GLYCOLAX Take 17 g by mouth 2 (two) times daily.   psyllium 95 % Pack Commonly known as: HYDROCIL/METAMUCIL Take 1 packet by mouth daily.   senna-docusate 8.6-50 MG tablet Commonly known as: Senokot-S Take 1 tablet by mouth at bedtime.   tamsulosin 0.4 MG Caps capsule Commonly known as: FLOMAX TAKE 1 CAPSULE(0.4 MG) BY MOUTH DAILY   traZODone 50 MG tablet Commonly known as: DESYREL Take 0.5 tablets (25 mg total) by mouth at bedtime.        Follow-up Information     [place], Betty G, MD Follow up.   Specialty: Family Medicine Why: Call the office in 1-2 days to make arrangements for hospital follow-up appointment. Contact information: [address] [place] [address] [phone]         Horton Chin, MD Follow up.   Specialty: Physical Medicine and Rehabilitation Why: As needed Contact information: [address] [place] [address] [phone]         Eugenio Hoes, MD. Go to.   Specialty: Cardiothoracic Surgery Contact information: [address] [place] [address] [phone]         Tonny Bollman, MD. Go to.   Specialty: Cardiology Contact information: [address] [place] [address] [phone]                 Signed: Milinda Antis 09/24/2022, 9:09 AM

## 2022-09-13 NOTE — Progress Notes (Signed)
PROGRESS NOTE   Subjective/Complaints: Having orthostatic hypotension with dizziness, using teds, OT will change to thigh high, abdominal binder ordered, changed dosing of lopressor   ROS: no CP, Mild DOE, no N/V/abd pain, no jt pain, +orthostasis   Objective:   No results found. Recent Labs    09/13/22 0620  WBC 11.5*  HGB 10.3*  HCT 31.3*  PLT 456*   No results for input(s): "NA", "K", "CL", "CO2", "GLUCOSE", "BUN", "CREATININE", "CALCIUM" in the last 72 hours.   Intake/Output Summary (Last 24 hours) at 09/13/2022 1113 Last data filed at 09/13/2022 0700 Gross per 24 hour  Intake 716 ml  Output 1475 ml  Net -759 ml        Physical Exam: Vital Signs Blood pressure 130/68, pulse 66, temperature 98.5 F (36.9 C), temperature source Oral, resp. rate 18, height 6\' 1"  (1.854 m), weight 86.2 kg, SpO2 95%. Gen: no distress, normal appearing HEENT: oral mucosa pink and moist, NCAT Cardio: Reg rate Chest: normal effort, normal rate of breathing Abd: soft, non-distended Ext: no edema Psych: pleasant, normal affect Skin: No evidence of breakdown, no evidence of rash Musculoskeletal:        General: No swelling.     Cervical back: Neck supple. No tenderness.     Comments: UE strength 5-/5 B/L- painful with R deltoid- but still 4+/5 Les- HF 5-/5; KE 5-/5; R DF and PF 5-/5 L DF 3+/5 and PF 5-/5   Skin:    General: Skin is warm and dry.     Comments: Sternal incision looks great Incision on R leg x2- near groin and R calf- look good Severe purple bruising posterior proximal Thigh on R- medial scab over the saphenous v region , no induration no eythema no tenderness to palpation  2 IV's LUE- look OK  Neurological:     General: No focal deficit present.     Mental Status: He is alert and oriented to person, place, and time.     Comments: Decreased to light touch from knees to toes B/L- worse on LLE Higher level  cognition- slightly delayed responses- even with repetition-  Cannot follow 3 step command, but can 1 step and slowed on 2 step command.   Psychiatric:        Mood and Affect: Mood normal.        Behavior: Behavior normal.   Assessment/Plan: 1. Functional deficits which require 3+ hours per day of interdisciplinary therapy in a comprehensive inpatient rehab setting. Physiatrist is providing close team supervision and 24 hour management of active medical problems listed below. Physiatrist and rehab team continue to assess barriers to discharge/monitor patient progress toward functional and medical goals  Care Tool:  Bathing  Bathing activity did not occur: Refused           Bathing assist       Upper Body Dressing/Undressing Upper body dressing   What is the patient wearing?: Pull over shirt    Upper body assist Assist Level: Supervision/Verbal cueing    Lower Body Dressing/Undressing Lower body dressing      What is the patient wearing?: Pants     Lower body assist Assist for  lower body dressing: Moderate Assistance - Patient 50 - 74%     Toileting Toileting    Toileting assist Assist for toileting: Moderate Assistance - Patient 50 - 74%     Transfers Chair/bed transfer  Transfers assist     Chair/bed transfer assist level: Moderate Assistance - Patient 50 - 74%     Locomotion Ambulation   Ambulation assist      Assist level: Minimal Assistance - Patient > 75% Assistive device: Cane-straight Max distance: 134ft   Walk 10 feet activity   Assist     Assist level: Minimal Assistance - Patient > 75% Assistive device: Cane-straight   Walk 50 feet activity   Assist    Assist level: Minimal Assistance - Patient > 75% Assistive device: Cane-straight    Walk 150 feet activity   Assist    Assist level: Minimal Assistance - Patient > 75% Assistive device: Cane-straight    Walk 10 feet on uneven surface  activity   Assist      Assist level: Minimal Assistance - Patient > 75% Assistive device: Walker-rolling   Wheelchair     Assist Is the patient using a wheelchair?: Yes Type of Wheelchair: Manual    Wheelchair assist level: Contact Guard/Touching assist Max wheelchair distance: 50    Wheelchair 50 feet with 2 turns activity    Assist        Assist Level: Contact Guard/Touching assist   Wheelchair 150 feet activity     Assist      Assist Level: Maximal Assistance - Patient 25 - 49%   Blood pressure 130/68, pulse 66, temperature 98.5 F (36.9 C), temperature source Oral, resp. rate 18, height 6\' 1"  (1.854 m), weight 86.2 kg, SpO2 95%.    Medical Problem List and Plan: 1. Functional deficits secondary to debility due to CABG x3 due to STEMI             -patient may  shower-cover incisions             -ELOS/Goals: 7-10 days mod I             Grounds pass ordered   2.  Antithrombotics: -DVT/anticoagulation:  Pharmaceutical: Lovenox start renal dose Lovenox             -antiplatelet therapy: Aspirin and Plavix    3. Pain Management: Tylenol as needed   4. Mood/Behavior/Sleep: LCSW to evaluate and provide emotional support             -antipsychotic agents: n/a   5. Neuropsych/cognition: This patient is? capable of making decisions on his own behalf.   6. Skin/Wound Care: Routine skin care checks             -monitor chest and leg incisions, echymosis no evidence of infection R thigh    7. Fluids/Electrolytes/Nutrition: Routine Is and Os and follow-up chemistries             -continue potassium supplement   8: Hypertension: monitor TID and prn (meds see #10)   9: Hyperlipidemia: continue statin   10: CAD s/p CABG 8/07:             -sternal precautions             -continue amiodarone              -on aspirin and Plavix             -continue Lopressor             -  follow-up with Dr. Leafy Ro   11: BPH: continue Flomax   12: ABLA: follow-up CBC   13: Volume  overload: TEE with normal LV function and EF ~60-65% -continue diuresis with Lasix 40 mg daily, asked patient to weight patient today             -monitor volume status/daily weight   -weight reviewed and is 86.2 kg on 8/15, asked nursing to weigh patient 8/16 14: AKI: improving; follow-up BMP             -encourage PO fluids (not on FR, but getting Lasix)  -discussed that creatinine is improving.     Latest Ref Rng & Units 09/10/2022    6:20 AM 09/09/2022    5:00 PM 09/07/2022    1:33 AM  BMP  Glucose 70 - 99 mg/dL 161  096  045   BUN 8 - 23 mg/dL 21  23  19    Creatinine 0.61 - 1.24 mg/dL 4.09  8.11  9.14   Sodium 135 - 145 mmol/L 134  135  134   Potassium 3.5 - 5.1 mmol/L 4.1  4.2  3.8   Chloride 98 - 111 mmol/L 100  95  100   CO2 22 - 32 mmol/L 26  27  25    Calcium 8.9 - 10.3 mg/dL 8.6  8.8  8.2      15: Prediabetes: continue carb modified diet, d/c CBG checks and insulin   16: pAF: continue amiodarone 400 mg Bid for two days, then 200 mg BID for seven days then continue on 200 mg daily at discharge  -changed lopressor to Toprol XL at night             -follow-up with Dr. Dayle Points  17. Higher level cognitive issues complicated by [facility]- pt wants ot wait on SLP order for now.    18. Constipation- chart reviewed and last BM was 8/18   19. Orthostasis: changed Lopressor to Toprol XL aHS      LOS: 4 days A FACE TO FACE EVALUATION WAS PERFORMED  Jeanna Giuffre P Hakim Minniefield 09/13/2022, 11:13 AM

## 2022-09-13 NOTE — Progress Notes (Signed)
Occupational Therapy Session Note  Patient Details  Name: Collin [name] MRN: [id number] Date of Birth: Sep 22, 1940  Today's Date: 09/13/2022 OT Individual Time: 1010-1105 OT Individual Time Calculation (min): 55 min    Short Term Goals: Week 1:  OT Short Term Goal 1 (Week 1): Pt will don pants with CGA OT Short Term Goal 2 (Week 1): Pt will complete sit <> stand with min A consistently OT Short Term Goal 3 (Week 1): Pt will complete toileting tasks with min A  Skilled Therapeutic Interventions/Progress Updates:  Skilled OT intervention completed with focus on sit > stands, BP assessment, functional ambulation. Pt received upright in bed, agreeable to session. No pain reported.  Pt expressed fatigue from therapy and frequent diarrhea (team aware), but agreeable to get OOB. Transitioned to Rt sidelying > EOB with good sternal precaution adherence and overall supervision. Attempted sit > stand without AD, with hands on thighs, but with light min A, pt had posterior bias and flopped back down on bed. OT provided cues for > anterior weight shift and for gluteal and quad activation. Able to stand on 2nd trial with min A then OT handed SPC for stand pivot at min A level. Pt reported light headedness upon transfer. See below for vitals. MD notified of continued orthostatic hypotension with request to use abdominal binder; order requested. OT switched knee for thigh TEDs at max A and redonned shoes. Encouraged fluids and issued pt fresh cup.  Vitals BP 123/65 (seated after transfer w/ knee TEDs on); asymptomatic BP 87/61 (standing); symptomatic BP 106/58 (seated, after ambulatory transfer > recliner; thigh TEDs); asymptomatic  Pt required min A to stand from w/c without AD, ambulated with SPC with CGA > recliner with cues needed to back up to chair prior to sitting for safety. Repeated education needed about BP readings, what's considered baseline/normal, interventions for BP management etc.  Provided a visual of BP readings for pt's memory of his lowest drop per request.  Pt remained seated in w/c, with belt alarm on/activated, and with all needs in reach at end of session.   Therapy Documentation Precautions:  Precautions Precautions: Fall, Sternal Precaution Booklet Issued: Yes (comment) Precaution Comments: educated pt on sternal precautions Other Brace: wears L foot brace for h/o foot drop Restrictions Weight Bearing Restrictions: No Other Position/Activity Restrictions: sternal precautions    Therapy/Group: Individual Therapy  Melvyn Novas, MS, OTR/L  09/13/2022, 12:16 PM

## 2022-09-13 NOTE — Progress Notes (Signed)
Physical Therapy Session Note  Patient Details  Name: Collin [name] MRN: [id number] Date of Birth: [birth year]  Today's Date: 09/13/2022 PT Individual Time: 0731-0826 and 1315-1426 PT Individual Time Calculation (min): 55 min and 71 min  Short Term Goals: Week 1:  PT Short Term Goal 1 (Week 1): Patient will complete sit<>stand with CGA. PT Short Term Goal 1 - Progress (Week 1): Progressing toward goal PT Short Term Goal 2 (Week 1): Pt will ambulate >150' with CGA with RW on level surfaces. PT Short Term Goal 2 - Progress (Week 1): Progressing toward goal PT Short Term Goal 3 (Week 1): Patient will recall and verbalize sternal precautions.  Skilled Therapeutic Interventions/Progress Updates:   Treatment Session 1 Received pt sitting in WC with NT and RN present at bedside. Pt agreeable to PT treatment, and reported soreness along R anterior chest. Session with emphasis on functional mobility/transfers, generalized strengthening and endurance, and gait training. Donned ted hose (BP management and edema control) and shoes with L foot up brace with total A. Pt required x 2 trials to stand from [facility] with RW and mod A to assess BP - noted significant posterior lean in standing but pt with good recall of sternal precautions.   Pt transported to/from room in [facility] dependently for time management purposes. Stood from [facility] with RW and light mod A (due to posterior lean) and ambulated 131ft with RW and CGA/light min A. Took seated rest break, then stood from EOM without AD and min A and ambulated additional 170ft with min L HHA - noted increased unsteadiness but pt reported feeling more comfortable than when using RW. Provided pt with SPC, stood with SPC and min A, and ambulated additional 131ft with SPC and min A (pt with 1 large LOB requiring heavy min A to correct). Pt reported 7/10 fatigue afterwards and politely declined any further ambulation. Worked on blocked practice sit<>stands 2x5 from EOM without AD  and CGA - emphasis on avoiding compensating by pushing posterior legs on mat. Pt's technique improved with cues to move slower and for "nose over toes". Returned to room and pt requested to return to bed. Stood from [facility] with SPC and mod A and ambulated back to bed. Pt transferred sit<>semi-reclined with supervision and concluded session with pt semi-reclined in bed, needs within reach, and bed alarm on.   BP sitting in WC without teds: 116/59  - pt asymptomatic BP standing with teds:  131/102 - pt asymptomatic  Treatment Session 2 Received pt sitting in recliner, pt agreeable to PT treatment, and denied any pain during session. Session with emphasis on functional mobility/transfers, generalized strengthening and endurance, dynamic standing balance/coordination, and gait training. Noted abdominal binder not delivered yet. Stood x 2 trials from recliner with SPC and mod A (due to posterior lean) and ambulated 71ft with SPC and CGA to WC.   Pt transported to/from room in [facility] dependently for time management purposes. Transferred onto Nustep with SPC and min A (noted increased unsteadiness) and performed seated BLE strengthening on Nustep at workload 4 decreasing to 3 for 8 minutes for a total of 482 steps with emphasis on cardiovascular endurance, LE strength, and increasing BP. Pt required 1 rest break due to reports of increased knee pain - cues provided for slower eccentric control rather than slamming footplates. Pt required x 4 attempts and min/mod A due to posterior lean to stand from Nustep and ambulated 61ft with SPC and min A to mat (reporting LE fatigue). Stood from  EOM without AD and min A and worked on dynamic standing balance tossing ball with student observer 2x20 with mod A for balance due to posterior lean. Stood from [facility] with RW and min A (selected RW>SPC due to low BP and increased unsteadiness) and ambulated 142ft with RW and light min A. Pt continued to report feeling "off" despite vitals being  stable. Pt stood from EOM x 3 additional trials with RW and min A and performed alternating toe taps to 6in step 3x10 reps with CGA for balance.   BP sitting in recliner with thigh high teds on: 110/61 - pt slightly symptomatic BP standing with thigh high teds on: 91/54 - pt symptomatic  BP sitting after working on [employer]: 123/62 - pt reported improvements in symptoms  BP sitting after tossing beach ball: 110/71 - pt reported symptoms about the same BP sitting after ambulating: 116/71 HR 72bpm and SPO2 96%  Returned to room, stood from [facility] with RW and mod A, and ambulated 29ft with RW and min A to recliner. Concluded session with pt sitting in recliner, needs within reach, and seatbelt alarm on. Encouraged fluid intake.   Therapy Documentation Precautions:  Precautions Precautions: Fall, Sternal Precaution Booklet Issued: Yes (comment) Precaution Comments: educated pt on sternal precautions Other Brace: wears L foot brace for h/o foot drop Restrictions Weight Bearing Restrictions: No RUE Weight Bearing: Weight bearing as tolerated LUE Weight Bearing: Weight bearing as tolerated RLE Weight Bearing: Weight bearing as tolerated LLE Weight Bearing: Weight bearing as tolerated Other Position/Activity Restrictions: sternal precautions  Therapy/Group: Individual Therapy Marlana Salvage Zaunegger Blima Rich PT, DPT 09/13/2022, 6:48 AM

## 2022-09-14 ENCOUNTER — Encounter (HOSPITAL_COMMUNITY): Payer: Self-pay | Admitting: Thoracic Surgery (Cardiothoracic Vascular Surgery)

## 2022-09-14 DIAGNOSIS — I951 Orthostatic hypotension: Secondary | ICD-10-CM

## 2022-09-14 NOTE — Progress Notes (Signed)
PROGRESS NOTE   Subjective/Complaints: Continues to be orthostatic, will reach to cardiology to see if lopressor can be stopped, discussed that he is now on amiodarone   ROS: no CP, Mild DOE, no N/V/abd pain, no jt pain, +orthostasis with dizziness   Objective:   No results found. Recent Labs    09/13/22 0620  WBC 11.5*  HGB 10.3*  HCT 31.3*  PLT 456*   No results for input(s): "NA", "K", "CL", "CO2", "GLUCOSE", "BUN", "CREATININE", "CALCIUM" in the last 72 hours.   Intake/Output Summary (Last 24 hours) at 09/14/2022 1042 Last data filed at 09/14/2022 0700 Gross per 24 hour  Intake 236 ml  Output 1375 ml  Net -1139 ml        Physical Exam: Vital Signs Blood pressure 123/65, pulse 64, temperature 98.6 F (37 C), temperature source Oral, resp. rate 18, height 6\' 1"  (1.854 m), weight 86.2 kg, SpO2 93%. Gen: no distress, normal appearing HEENT: oral mucosa pink and moist, NCAT Cardio: Reg rate Chest: normal effort, normal rate of breathing Abd: soft, non-distended Ext: no edema, thigh high teds donned Psych: pleasant, normal affect Skin: intact Musculoskeletal:        General: No swelling.     Cervical back: Neck supple. No tenderness.     Comments: UE strength 5-/5 B/L- painful with R deltoid- but still 4+/5 Les- HF 5-/5; KE 5-/5; R DF and PF 5-/5 L DF 3+/5 and PF 5-/5   Skin:    General: Skin is warm and dry.     Comments: Sternal incision looks great Incision on R leg x2- near groin and R calf- look good Severe purple bruising posterior proximal Thigh on R- medial scab over the saphenous v region , no induration no eythema no tenderness to palpation  2 IV's LUE- look OK  Neurological:     General: No focal deficit present.     Mental Status: He is alert and oriented to person, place, and time.     Comments: Decreased to light touch from knees to toes B/L- worse on LLE Higher level cognition- slightly  delayed responses- even with repetition-  Cannot follow 3 step command, but can 1 step and slowed on 2 step command.   Psychiatric:        Mood and Affect: Mood normal.        Behavior: Behavior normal.   Assessment/Plan: 1. Functional deficits which require 3+ hours per day of interdisciplinary therapy in a comprehensive inpatient rehab setting. Physiatrist is providing close team supervision and 24 hour management of active medical problems listed below. Physiatrist and rehab team continue to assess barriers to discharge/monitor patient progress toward functional and medical goals  Care Tool:  Bathing  Bathing activity did not occur: Refused           Bathing assist       Upper Body Dressing/Undressing Upper body dressing   What is the patient wearing?: Pull over shirt    Upper body assist Assist Level: Supervision/Verbal cueing    Lower Body Dressing/Undressing Lower body dressing      What is the patient wearing?: Pants     Lower body assist Assist  for lower body dressing: Moderate Assistance - Patient 50 - 74%     Toileting Toileting    Toileting assist Assist for toileting: Moderate Assistance - Patient 50 - 74%     Transfers Chair/bed transfer  Transfers assist     Chair/bed transfer assist level: Moderate Assistance - Patient 50 - 74%     Locomotion Ambulation   Ambulation assist      Assist level: Minimal Assistance - Patient > 75% Assistive device: Cane-straight Max distance: 148ft   Walk 10 feet activity   Assist     Assist level: Minimal Assistance - Patient > 75% Assistive device: Cane-straight   Walk 50 feet activity   Assist    Assist level: Minimal Assistance - Patient > 75% Assistive device: Cane-straight    Walk 150 feet activity   Assist    Assist level: Minimal Assistance - Patient > 75% Assistive device: Cane-straight    Walk 10 feet on uneven surface  activity   Assist     Assist level: Minimal  Assistance - Patient > 75% Assistive device: Walker-rolling   Wheelchair     Assist Is the patient using a wheelchair?: Yes Type of Wheelchair: Manual    Wheelchair assist level: Contact Guard/Touching assist Max wheelchair distance: 50    Wheelchair 50 feet with 2 turns activity    Assist        Assist Level: Contact Guard/Touching assist   Wheelchair 150 feet activity     Assist      Assist Level: Maximal Assistance - Patient 25 - 49%   Blood pressure 123/65, pulse 64, temperature 98.6 F (37 C), temperature source Oral, resp. rate 18, height 6\' 1"  (1.854 m), weight 86.2 kg, SpO2 93%.    Medical Problem List and Plan: 1. Functional deficits secondary to debility due to CABG x3 due to STEMI             -patient may  shower-cover incisions             -ELOS/Goals: 7-10 days mod I             Grounds pass ordered   2.  Antithrombotics: -DVT/anticoagulation:  Pharmaceutical: Lovenox start renal dose Lovenox             -antiplatelet therapy: Aspirin and Plavix    3. Pain Management: Tylenol as needed   4. Mood/Behavior/Sleep: LCSW to evaluate and provide emotional support             -antipsychotic agents: n/a   5. Neuropsych/cognition: This patient is? capable of making decisions on his own behalf.   6. Skin/Wound Care: Routine skin care checks             -monitor chest and leg incisions, echymosis no evidence of infection R thigh    7. Fluids/Electrolytes/Nutrition: Routine Is and Os and follow-up chemistries             -continue potassium supplement   8: Hypertension: monitor TID and prn (meds see #10)   9: Hyperlipidemia: continue statin   10: CAD s/p CABG 8/07:             -sternal precautions             -continue amiodarone              -on aspirin and Plavix             -continue Lopressor             -  follow-up with Dr. Leafy Ro   11: BPH: continue Flomax   12: ABLA: follow-up CBC   13: Volume overload: TEE with normal LV  function and EF ~60-65% -continue diuresis with Lasix 40 mg daily, asked patient to weight patient today             -monitor volume status/daily weight   -weight reviewed and is 86.2 kg on 8/15, asked nursing to weigh patient 8/16 14: AKI: improving; follow-up BMP             -encourage PO fluids (not on FR, but getting Lasix)  -discussed that creatinine is improving.     Latest Ref Rng & Units 09/10/2022    6:20 AM 09/09/2022    5:00 PM 09/07/2022    1:33 AM  BMP  Glucose 70 - 99 mg/dL 161  096  045   BUN 8 - 23 mg/dL 21  23  19    Creatinine 0.61 - 1.24 mg/dL 4.09  8.11  9.14   Sodium 135 - 145 mmol/L 134  135  134   Potassium 3.5 - 5.1 mmol/L 4.1  4.2  3.8   Chloride 98 - 111 mmol/L 100  95  100   CO2 22 - 32 mmol/L 26  27  25    Calcium 8.9 - 10.3 mg/dL 8.6  8.8  8.2      15: Prediabetes: continue carb modified diet, d/c CBG checks and insulin   16: pAF: continue amiodarone 400 mg Bid for two days, then 200 mg BID for seven days then continue on 200 mg daily at discharge, discussed this decrease which is set for 8/24 with patient and therapy  -changed lopressor to Toprol XL at night             -follow-up with Dr. Dayle Points  17. Higher level cognitive issues complicated by [facility]- pt wants ot wait on SLP order for now.    18. Loose stool: d/c senna  19. Orthostasis: changed Lopressor to Toprol XL HS, will consult with cardiology whether this can be discontinued        LOS: 5 days A FACE TO FACE EVALUATION WAS PERFORMED  Drema Pry Jareli Highland 09/14/2022, 10:42 AM

## 2022-09-14 NOTE — Progress Notes (Signed)
Physical Therapy Session Note  Patient Details  Name: Collin [name] MRN: [id number] Date of Birth: 22-Jan-1941  Today's Date: 09/14/2022 PT Individual Time: 1101-1157 and 1415-1502 PT Individual Time Calculation (min): 56 min and 47 min  Short Term Goals: Week 1:  PT Short Term Goal 1 (Week 1): Patient will complete sit<>stand with CGA. PT Short Term Goal 1 - Progress (Week 1): Progressing toward goal PT Short Term Goal 2 (Week 1): Pt will ambulate >150' with CGA with RW on level surfaces. PT Short Term Goal 2 - Progress (Week 1): Progressing toward goal PT Short Term Goal 3 (Week 1): Patient will recall and verbalize sternal precautions.  Skilled Therapeutic Interventions/Progress Updates:   Treatment Session 1 Received pt sitting in recliner with thigh high teds on and ace wraps donned - still doesn't have abdominal binder. Pt agreeable to PT treatment and denied any pain during session. Session with emphasis on functional mobility/transfers, BP management, generalized strengthening and endurance, dynamic standing balance/coordination, and gait training. Pt required x 3 attempts to stand from recliner due to posterior LOB requiring mod A to correct. Took seated rest break for "lightheadedness" symptoms to resolve, then required x 3 attempts to stand again from recliner with RW and ambulated 28ft with RW and min A to WC.   Pt transported to/from room in [facility] dependently for time management purposes. Required x 2 attempts and min A to stand from [facility] and performed ambulatory simulated car transfer with RW and min A overall (pt able to stand from higher surfaces much easier). Stood from [facility] with RW and min A to correct posterior lean and pt ambulated 1102ft with RW and min A (increased LOB when turning to sit on mat). Stood from elevated EOM with RW and CGA and performed alternating toe taps to 3 cones 2x3 trials with CGA for balance - emphasis on hip flexor strength and standing tolerance.  Transitioned to sit<>stands without UE support 2x8 with min A fading to CGA with emphasis on quad strength and eccentric control. Pt then performed x10 mini squats with 2 posterior LOB onto mat. Transferred back into WC via stand<>pivot with RW and min A and returned to room. Required x 2 attempts and min A to stand from [facility] and ambulated 13ft with RW and min A to recliner. Concluded session with pt sitting in recliner, needs within reach, and seatbelt alarm on.   Vitals: Sitting in recliner: 106/66 - pt asymptomatic  Standing: 79/54 - pt symptomatic  Sitting after ambulating to WC: 118/62 - pt reporting mild symptoms  Sitting after car transfer: 121/67 - pt reporting mild symptoms  Sitting after ambulating: 116/67 - pt reporting mild symptoms   Treatment Session 2 Received pt sitting in recliner with thigh high teds and ace wraps donned. Pt agreeable to PT treatment and denied any pain during session. Session with emphasis on functional mobility/transfers, BP management, generalized strengthening and endurance, dynamic standing balance/coordination, stair navigation, and gait training. Donned abdominal binder and stood from recliner with RW and CGA and ambulated 53ft with RW and CGA to WC. Pt transported to/from room in [facility] dependently for time management purposes. Required x 2 attempts and mod A to stand from [facility] with SPC and ambulated 91ft x 2 trials with SPC and min A. Stood at staircase and navigated 8 6in steps using R handrail and min A using a lateral stepping technique to simulate steps at home. Returned to room, stood from [facility] with SPC and min A, and  ambulated back to bed. Of note, pt continues to demonstrate posterior LOB upon coming into standing. Pt performed 4x10 mini squats, then transferred into supine with supervision. Removed binder, thigh high teds, and ace wrap and concluded session with pt semi-reclined in bed, needs within reach, and bed alarm on.   Vitals: Standing: 105/57 - pt  slightly symptomatic Sitting after ambulating: 129/79 - pt slightly symptomatic Sitting after navigating stairs: 118/68 - pt slightly symptomatic  Therapy Documentation Precautions:  Precautions Precautions: Fall, Sternal Precaution Booklet Issued: Yes (comment) Precaution Comments: educated pt on sternal precautions Other Brace: wears L foot brace for h/o foot drop Restrictions Weight Bearing Restrictions: No RUE Weight Bearing: Weight bearing as tolerated LUE Weight Bearing: Weight bearing as tolerated RLE Weight Bearing: Weight bearing as tolerated LLE Weight Bearing: Weight bearing as tolerated Other Position/Activity Restrictions: sternal precautions  Therapy/Group: Individual Therapy Marlana Salvage Zaunegger Blima Rich PT, DPT 09/14/2022, 6:52 AM

## 2022-09-14 NOTE — Progress Notes (Signed)
Physical Therapy Session Note  Patient Details  Name: Tayt Lombardozzi Mirsky MRN: [id number] Date of Birth: 01-Mar-1940  Today's Date: 09/14/2022 PT Individual Time: 0800-0833 PT Individual Time Calculation (min): 33 min   Short Term Goals: Week 1:  PT Short Term Goal 1 (Week 1): Patient will complete sit<>stand with CGA. PT Short Term Goal 1 - Progress (Week 1): Progressing toward goal PT Short Term Goal 2 (Week 1): Pt will ambulate >150' with CGA with RW on level surfaces. PT Short Term Goal 2 - Progress (Week 1): Progressing toward goal PT Short Term Goal 3 (Week 1): Patient will recall and verbalize sternal precautions.  Skilled Therapeutic Interventions/Progress Updates:      Pt seated in WC upon arrival brushing his teeth. Pt agreaable to therapy. Pt denies any pain.   Vitals Assessed: pt wearing knee high ted hose, seated BP 112/70 HR 71, standing BP BP 77/52 HR 81, pt reports feeling light headed and warm, returned to sitting, BP 96/57 HR 74, sitting post 5 min, BP increasing to 112/63 with continued sitting. Notified MD and interdisciplinary team, therapist donned thigh high ted hose, and encouraged fluids. Encouraged pt to seat in [facility] for hemodynamic stability until next session. Pt agreeable.   Pt perfomed 1x15 seated LAQ, alternating marching, and ankle pumps for improved hemodynmic stability and activity tolerance.   Notified MD  that pt has not received abdominal binder.   Pt seated in WC at end of session with L LE elevated, with all needs within reach, and seatbelt alarm on.   Therapy Documentation Precautions:  Precautions Precautions: Fall, Sternal Precaution Booklet Issued: Yes (comment) Precaution Comments: educated pt on sternal precautions Other Brace: wears L foot brace for h/o foot drop Restrictions Weight Bearing Restrictions: No RUE Weight Bearing: Weight bearing as tolerated LUE Weight Bearing: Weight bearing as tolerated RLE Weight Bearing: Weight bearing as  tolerated LLE Weight Bearing: Weight bearing as tolerated Other Position/Activity Restrictions: sternal precautions  Therapy/Group: Individual Therapy  [facility] [place], [place], DPT  09/14/2022, 7:34 AM

## 2022-09-14 NOTE — Progress Notes (Signed)
Occupational Therapy Session Note  Patient Details  Name: Collin [name] MRN: [id number] Date of Birth: [birth year]  Today's Date: 09/14/2022 OT Individual Time: 0905-1000 OT Individual Time Calculation (min): 55 min    Short Term Goals: Week 1:  OT Short Term Goal 1 (Week 1): Pt will don pants with CGA OT Short Term Goal 2 (Week 1): Pt will complete sit <> stand with min A consistently OT Short Term Goal 3 (Week 1): Pt will complete toileting tasks with min A  Skilled Therapeutic Interventions/Progress Updates:  Skilled OT intervention completed with focus on BP management. Pt received seated in w/c stating "I'm not good", but agreeable to session. No pain reported however pt reporting he is limited by light headed-ness this morning.  Pt reviewed earlier therapy events, with BP that dropped very low this AM even with TEDs. Nurse made aware of abdominal binder needed for BP management as currently not in room despite 2nd order from MD. MD present for rounds, with OT engaging in discussion about pt's trends with BP and dehydration in therapies to formulate plan for BP management.  OT assessed BP (see below). Min A sit > stand without AD, however unable to remain standing for first standing reading with LOB requiring min A to recover to sitting in w/c. Issued water and donned ACE wraps. Able to stand with CGA on next trial without AD. Transported dependently in w/c for time. Min A sit > stand for posterior bias, then CGA stand pivot increasing to min A as pt plopped into nustep seat.   Vitals -BP 113/63 (seated), HR 63 bpm -BP 103/56 (after stand but unable to remain standing 2/2 dizziness and LOB), HR 65 bpm -BP 94/56 (standing with added ACE wrap to thigh level), HR 68 bpm -BP 131/67 (after 3 mins on nustep), HR 66 bpm  Pt completed the following intervals on nustep to promote global/cardiovascular endurance and BP stability needed for independence with BADLs and functional mobility: -3  mins, BLE only, level 7 -3 mins, BLE only, level 5  Min A stand pivot > w/c. Back in room, NT notified of pt request to use bathroom, with NT present for direct care handoff at end of session. All needs met.    Therapy Documentation Precautions:  Precautions Precautions: Fall, Sternal Precaution Booklet Issued: Yes (comment) Precaution Comments: educated pt on sternal precautions Other Brace: wears L foot brace for h/o foot drop Restrictions Weight Bearing Restrictions: No Other Position/Activity Restrictions: sternal precautions    Therapy/Group: Individual Therapy  Melvyn Novas, MS, OTR/L  09/14/2022, 1:34 PM

## 2022-09-14 NOTE — Progress Notes (Signed)
Orthopedic Tech Progress Note Patient Details:  Collin [name] 12-26-1940 [id number]  Ortho Devices Type of Ortho Device: Abdominal binder Ortho Device/Splint Location: STOMACH Ortho Device/Splint Interventions: Ordered   Post Interventions Patient Tolerated: Well Instructions Provided: Care of device  [name] 09/14/2022, 12:10 PM

## 2022-09-14 NOTE — Progress Notes (Signed)
Patient ID: Collin [name], male   DOB: 09-11-1940, 82 y.o.   MRN: [id number]  SW informed patient covering SW will provide updates tomorrow. Spouse plans to be present if not present please reach via telephone.

## 2022-09-14 NOTE — Progress Notes (Addendum)
Patient Name: Collin [name] Date of Encounter: 09/14/2022  [facility] Cardiologist: Tonny Bollman, MD  Interval Summary  .    Patient with history of remote inferior infarct in 1998, treated with POBA to RCA and medically managed mid LAD disease originally admitted by [facility] on 09/01/22 in the setting of inferior STEMI. LHC found distal LM and ostial LCX with LAD disease and patient underwent urgent CABG on the same day with Dr. Leafy Ro. He was extubated on 8/8 and IABP removed. Patient started on Amiodarone on 8/8 for afib prophylaxis in post surgical setting, later did have some paroxysmal afib. He was also noted with evidence of volume overload and was placed on Lasix 40mg  daily. Following surgery, patient with notable deconditioning and CIR was arranged with patient being admitted 8/15. By 8/19, patient noted to have orthostatic dizziness/hypotension prompting cardiology consult for management of GDMT.   Today on exam, patient reports that he's having a good afternoon in terms of symptoms. Reports that he was fatigued with some dizziness this morning that has since resolved. Confirms significant daily urination while on lasix, estimates intake of 3-4 12 oz cups per day. No chest pain, dyspnea, palpitations.  Vital Signs .    Vitals:   09/13/22 0939 09/13/22 2012 09/14/22 0552 09/14/22 1406  BP: 130/68 118/63 123/65 115/64  Pulse: 66 65 64 64  Resp:  18 18 17   Temp:  98.4 F (36.9 C) 98.6 F (37 C) 98.4 F (36.9 C)  TempSrc:  Oral Oral Oral  SpO2:  98% 93% 97%  Weight:      Height:        Intake/Output Summary (Last 24 hours) at 09/14/2022 1442 Last data filed at 09/14/2022 1416 Gross per 24 hour  Intake 236 ml  Output 1475 ml  Net -1239 ml      09/12/2022    5:00 AM 09/10/2022    4:56 PM 09/09/2022    3:37 PM  Last 3 Weights  Weight (lbs) 190 lb 0.6 oz 192 lb 10.9 oz 190 lb 0.6 oz  Weight (kg) 86.2 kg 87.4 kg 86.2 kg      Telemetry/ECG    No recent ECG,  not on telemetry - Personally Reviewed  Physical Exam .   GEN: No acute distress.   Neck: No JVD Cardiac: RRR, no murmurs, rubs, or gallops.  Respiratory: Clear to auscultation bilaterally. GI: Soft, nontender, non-distended  MS: No edema  Assessment & Plan .     CAD s/p CABG 09/01/22  Patient underwent urgent CABG x3 with Dr. Leafy Ro on 8/7 after LHC in setting of inferior STEMI found severe LM disease. Following the procedure, patient weaned and extubated, also with IABP removed. In the days following surgery, patient requiring max assist with bed transfers and CIR arranged.   Continue ASA, Plavix Continue statin Continue Toprol XL 12.5mg  every day  Hypertension Orthostatic hypotension  Post operative BP at goal, though patient with orthostatic symptoms over the past few days. Today orthostatic vitals negative. (Lying 109/63, HR 78 : sitting 109/62, HR 77 : standing 101/61, HR 73).   Given AHA guidelines for post CABG medication management with patients who've had acute infarction and usefulness in reducing recurrent post-CABG afib, would prefer to continue low dose beta blocker if possible. Trial cessation of loop diuretic today. If patient continues to have orthostatic symptoms with this stopped, would reconsider stopping beta blocker.  Hyperlipidemia  LDL excellent at 31. Continue Atorvastatin 80mg .  Paroxysmal atrial fibrillation  Patient  initially placed on amiodarone following CABG for afib prophylaxis but despite this did develop paroxysmal afib. Is now on 200mg  BID with plans to decrease to 200mg  daily starting 8/24.  Given only brief paroxysmal afib in the post-CABG setting without recurrence, reasonable to continue with DAPT only. However, if patient were to have any recurrence of afib, would recommend OAC after POD 30.  For questions or updates, please contact [facility] Please consult [email or web address] for contact info under        Signed, Perlie Gold, PA-C   Patient seen, examined. Available data reviewed. Agree with findings, assessment, and plan as outlined by Perlie Gold, PA-C.  The patient is independently interviewed and examined.  His wife is at the bedside.  He has been experiencing dizziness with standing up or moving around.  There are concerns about orthostatic hypotension and cardiology was asked to reevaluate the patient.  The patient is well-known to me from the outpatient setting.  He underwent emergent CABG and now is recovering at [facility] inpatient rehab.  He looks very good at rest, no chest pain or shortness of breath.  He is alert, oriented, no distress.  Heart is regular rate and rhythm no murmur gallop, lungs are clear, JVP is normal, abdomen soft and nontender, extremities have no edema.  I agree with the findings outlined above as well as the recommendations.  I think the patient might be a little bit dry and we should stop his furosemide.  He has no evidence of volume overload.  Today with sitting his blood pressure is 115/64 with a heart rate of 64 and with standing his blood pressure drops to 101/61 with a heart rate of 77.  He should push fluids and he can be liberal with sodium until his symptoms improve.  If he remains symptomatic it would be reasonable to discontinue his low-dose beta-blocker, but for now would recommend continuing both his amiodarone and metoprolol succinate as scheduled.  Furosemide is discontinued.  Please let us know if any other issues arise.  Tonny Bollman, M.D. 09/14/2022 5:48 PM

## 2022-09-14 NOTE — Progress Notes (Signed)
Spoke with cardiology provider on-call regarding orthostatic hypotension and review of medications/recommendations.

## 2022-09-15 MED ORDER — MAGNESIUM GLUCONATE 500 MG PO TABS
250.0000 mg | ORAL_TABLET | Freq: Every day | ORAL | Status: DC
  Administered 2022-09-15 – 2022-09-23 (×9): 250 mg via ORAL
  Filled 2022-09-15 (×9): qty 1

## 2022-09-15 NOTE — Patient Care Conference (Signed)
Inpatient RehabilitationTeam Conference and Plan of Care Update Date: 09/15/2022   Time: 11:50 AM    Patient Name: Collin [name]      Medical Record Number: [id number]  Date of Birth: [birth year] Sex: Male         Room/Bed: 4W17C/4W17C-01 Payor Info: Payor: [employer] / Plan: [employer] PPO / Product Type: *No Product type* /    Admit Date/Time:  09/09/2022  3:40 PM  Primary Diagnosis:  Debility  Hospital Problems: Principal Problem:   Debility Active Problems:   STEMI (ST elevation myocardial infarction) (HCC)   S/P CABG x 3   Orthostatic hypotension    Expected Discharge Date: Expected Discharge Date: 09/24/22  Team Members Present: Physician leading conference: Dr. Sula Soda Social Worker Present: Dossie Der, LCSW Nurse Present: Chana Bode, RN;Merlean Pizzini Marjo Bicker, RN PT Present: Raechel Chute, PT OT Present: Candee Furbish, OT PPS Coordinator present : Fae Pippin, SLP     Current Status/Progress Goal Weekly Team Focus  Bowel/Bladder   Continent B/B  LBM 09/14/22   Will maintain normal B/B pattern   Assit with toileting qshift/prn    Swallow/Nutrition/ Hydration               ADL's   Supervision UB, Mod A LB including TEDs, Mod-max A toileting   Supervision/CGA   Barriers- orthostatic hypotension; Plan to address global conditioning with prophylactics progressing to without    Mobility   bed mobility supervision, transfers with/without AD min A (mod A from low surfaces), gait 1110ft with SPC and min A - posterior lean in standing   supervision, mod I WC mobility  Barriers: BP management, dynamic standing balance/coordination, and endurance    Communication                Safety/Cognition/ Behavioral Observations               Pain   Denies pain at this time   Will be free from pain   Assess for pain qshift/prn    Skin   Surgical incision to chest and right leg.  OTA   Will be free from infection with no  breakdown  Assess for s/s of infection and promote healing      Discharge Planning:  Patient discharging home with spouse to assist 24/7 supervison. 3 steps to enter the home.   Team Discussion: Debility. Time toileting with frequency. Already on Flomax. Low post void residuals. Denies pain. Midline incision and right leg are healing and OTA. Hypotension improved with use of abdominal binder and thigh high Teds. Still reporting lightheadedness. May be due to Amiodarone. Will continue betablocker at this time. Daily weight. Sleep chart. Tolerating carb-mod diet. Trying cane and RW.  Does better with RW but patient prefers cane. Working on blood pressure management, dynamic standing, balance/coordination and endurance.  Patient on target to meet rehab goals: yes, continues to progress towards goals with a discharge date of 09/24/22  *See Care Plan and progress notes for long and short-term goals.   Revisions to Treatment Plan:  Adjust medications. Cards consulted. Lasix stopped. Monitor labs and VS.  Teaching Needs: Medications, safety, self care, gait/transfer training, etc.   Current Barriers to Discharge: Decreased caregiver support, Home enviroment access/layout, and Wound care  Possible Resolutions to Barriers: Family education Able to manage entrance of home safely Independent with incision care Order recommended DME     Medical Summary Current Status: orthostatic hypotension  Barriers to Discharge: Medical stability  Barriers to Discharge  Comments: orthostatic hypotension Possible Resolutions to [employer] Focus: discussed symptoms are likley secondary to amiodarone and should improve with dose decrease on 8/24, lasix d/ced   Continued Need for Acute Rehabilitation Level of Care: The patient requires daily medical management by a physician with specialized training in physical medicine and rehabilitation for the following reasons: Direction of a multidisciplinary  physical rehabilitation program to maximize functional independence : Yes Medical management of patient stability for increased activity during participation in an intensive rehabilitation regime.: Yes Analysis of laboratory values and/or radiology reports with any subsequent need for medication adjustment and/or medical intervention. : Yes   I attest that I was present, lead the team conference, and concur with the assessment and plan of the team.   Jearld Adjutant 09/15/2022, 11:50 AM

## 2022-09-15 NOTE — Progress Notes (Signed)
Occupational Therapy Session Note  Patient Details  Name: Collin [name] MRN: [id number] Date of Birth: [birth year]  Today's Date: 09/15/2022 OT Individual Time: 0865-7846 OT Individual Time Calculation (min): 70 min    Short Term Goals: Week 1:  OT Short Term Goal 1 (Week 1): Pt will don pants with CGA OT Short Term Goal 2 (Week 1): Pt will complete sit <> stand with min A consistently OT Short Term Goal 3 (Week 1): Pt will complete toileting tasks with min A  Skilled Therapeutic Interventions/Progress Updates:  Skilled OT intervention completed with focus on family education with wife present regarding CLOF, and shower transfers. Pt received seated in recliner with wife, CSW present, pt agreeable to session. No pain reported.  Pt expressed desire to DC sooner, with CSW asking OT to educate on justification for LOS. Education provided to all parties about min A CLOF for mobility, frequent posterior LOB, BP fluctuations, and self-care needs within his sternal precautions and goals of supervision that are not met at this time. Extensive time needed, as pt with frequent lack of awareness stating "my BP is the only problem and it's fixed now." Advised we need to see consistent stable levels not just 1 good day.  Discussed DME needs for home, with education needed about fall risk when getting OOB for toileting needs at night. OT advised pt get a BSC for home to use at bedside during night, with additional use of over standard commode to raise the height of the toilet for standing as pt with difficulty powering up from low levels. Wife receptive and agreeable. Reviewed urinal use for void, but needing a plan in place for unexpected needs of BM. CSW notified of need.  Pt again, denying posterior bias tendency, therefore OT allowed safe planned failure to demo to wife. Pt required 5 attempts to stand with hands on thighs, and once up, required min A to anterior weight shift to RW. Vitals WNL this  session (see below). Ambulated with CGA using RW > w/c. Transported dependently in w/c <> ADL bathroom.  Vitals -BP 125/66 (seated at rest with thigh TEDs and abdominal binder on), HR 73 bpm -BP 125/64 (standing), HR 83 bpm  Per wife, pt has walk in shower with STE and door, with corner seat and no grab bars. OT provided wife a sheet for home measurements of shower and bathroom details to determine options of DME as current recommendation is that corner seat is unsafe at this point 2/2 pt's tendency to fall backwards. Pt required min A to stand, then utilized RW with mod cues for safe positioning to doorway with simulated shower threshold. CGA with continued cues needed for side step method into shower to TTB. Required CGA to power up from seated on TTB however min A to correct posterior bias with increased time, then CGA back to w/c. Advised that pt complete all ADLs seated, and avoiding use of grab bars to pull up 2/2 sternal precautions. Pt unable to recall any of his sternal precautions with (re)education provided to pt and wife.   Back in room, pt stood with CGA, ambulated with RW with CGA > EOB. Doffed shoes with total A. Pt opting to leave TEDs on, but min A needed to doff abdominal binder. Supervision bed mobility to upright in bed. Pt remained semi upright in bed, with bed alarm on/activated, and with all needs in reach at end of session.   Therapy Documentation Precautions:  Precautions Precautions: Fall, Sternal Precaution Booklet Issued:  Yes (comment) Precaution Comments: educated pt on sternal precautions Other Brace: wears L foot brace for h/o foot drop Restrictions Weight Bearing Restrictions: No Other Position/Activity Restrictions: sternal precautions    Therapy/Group: Individual Therapy  Melvyn Novas, MS, OTR/L  09/15/2022, 3:35 PM

## 2022-09-15 NOTE — Progress Notes (Signed)
Physical Therapy Session Note  Patient Details  Name: Collin [name] Dutan MRN: [id number] Date of Birth: 1940-05-05  Today's Date: 09/15/2022 PT Individual Time: 0931-1026 PT Individual Time Calculation (min): 55 min   Short Term Goals: Week 1:  PT Short Term Goal 1 (Week 1): Patient will complete sit<>stand with CGA. PT Short Term Goal 1 - Progress (Week 1): Progressing toward goal PT Short Term Goal 2 (Week 1): Pt will ambulate >150' with CGA with RW on level surfaces. PT Short Term Goal 2 - Progress (Week 1): Progressing toward goal PT Short Term Goal 3 (Week 1): Patient will recall and verbalize sternal precautions.  Skilled Therapeutic Interventions/Progress Updates:   Received pt sitting in recliner, pt agreeable to PT treatment, and denied any pain during session. Session with emphasis on functional mobility/transfers, generalized strengthening and endurance, dynamic standing balance/coordination, and gait training. Pt required x 2 attempts and min A to stand from recliner - cues for "nose over toes" and ambulated 67ft with SPC and min A to WC. Pt transported to/from room in [facility] dependently for energy conservation purposes. Required x 2 attempts and min A to stand from [facility] (same cues mentioned above) and transferred to/from [facility] with SPC and CGA. Pt performed seated BLE strengthening on workload 6 for 8 minutes with steps per minute >50 for a total of 438 steps with emphasis on cardiovascular endurance and to increase BP - 1 rest/water break after 5 minutes.  Pt stood from EOM x 2 trials with CGA (successful on first attempt and good recall of cues for technique) and ambulated ~324ft with RW and CGA. Took seated rest break, then ambulated additional ~36ft with SPC and min A (1 LOB when turning requiring heavy min A to correct). Discussed RW vs SPC and pt recognized that he is safest with RW but still wants to work on ambulation with SPC to eventually be able to walk without AD. Required x 2  attempts and CGA/light min A to stand from EOM and transferred back into WC without AD and CGA. Returned to room, required x 2 attempts and min A to stand from [facility], and ambulated 41ft with SPC and min A back to recliner - cues to avoid reaching out for external support to furniture walk. Concluded session with pt sitting in recliner, needs within reach, and seatbelt alarm on. Pt demonstrated improvements today with ability to correct posterior bias when standing.  Vitals with thigh high teds and abdominal binder donned: BP sitting in recliner: 114/62 - pt asymptomatic  BP standing: 88/58 - pt symptomatic  BP after working on [employer] - 137/68 - pt symptomatic  BP sitting at end of session: 119/67 - pt asymptomatic   Therapy Documentation Precautions:  Precautions Precautions: Fall, Sternal Precaution Booklet Issued: Yes (comment) Precaution Comments: educated pt on sternal precautions Other Brace: wears L foot brace for h/o foot drop Restrictions Weight Bearing Restrictions: No RUE Weight Bearing: Weight bearing as tolerated LUE Weight Bearing: Weight bearing as tolerated RLE Weight Bearing: Weight bearing as tolerated LLE Weight Bearing: Weight bearing as tolerated Other Position/Activity Restrictions: sternal precautions  Therapy/Group: Individual Therapy Marlana Salvage Zaunegger Blima Rich PT, DPT 09/15/2022, 6:48 AM

## 2022-09-15 NOTE — Progress Notes (Signed)
PROGRESS NOTE   Subjective/Complaints: Felt lightheaded again this morning. Discussed that cardiology is following and may d/c his beta blocker since he is continuing to feel lightheaded   ROS: no CP, Mild DOE, no N/V/abd pain, no jt pain, +orthostasis with lightheadedness   Objective:   No results found. Recent Labs    09/13/22 0620  WBC 11.5*  HGB 10.3*  HCT 31.3*  PLT 456*   No results for input(s): "NA", "K", "CL", "CO2", "GLUCOSE", "BUN", "CREATININE", "CALCIUM" in the last 72 hours.   Intake/Output Summary (Last 24 hours) at 09/15/2022 1053 Last data filed at 09/15/2022 0230 Gross per 24 hour  Intake --  Output 1100 ml  Net -1100 ml        Physical Exam: Vital Signs Blood pressure 121/67, pulse 66, temperature 97.6 F (36.4 C), resp. rate 16, height 6\' 1"  (1.854 m), weight 86.2 kg, SpO2 94%. Gen: no distress, normal appearing HEENT: oral mucosa pink and moist, NCAT Cardio: Reg rate Chest: normal effort, normal rate of breathing Abd: soft, non-distended Ext: no edema, thigh high teds donned Psych: pleasant, normal affect Skin: intact Musculoskeletal:        General: No swelling.     Cervical back: Neck supple. No tenderness.     Comments: UE strength 5-/5 B/L- painful with R deltoid- but still 4+/5 Les- HF 5-/5; KE 5-/5; R DF and PF 5-/5 L DF 3+/5 and PF 5-/5   Skin:    General: Skin is warm and dry.     Comments: Sternal incision looks great Incision on R leg x2- near groin and R calf- look good Severe purple bruising posterior proximal Thigh on R- medial scab over the saphenous v region , no induration no eythema no tenderness to palpation  2 IV's LUE- look OK  Neurological:     General: No focal deficit present.     Mental Status: He is alert and oriented to person, place, and time.     Comments: Decreased to light touch from knees to toes B/L- worse on LLE Higher level cognition- slightly  delayed responses- even with repetition-  Cannot follow 3 step command, but can 1 step and slowed on 2 step command.   Psychiatric:        Mood and Affect: Mood normal.        Behavior: Behavior normal.  GU: normal stools   Assessment/Plan: 1. Functional deficits which require 3+ hours per day of interdisciplinary therapy in a comprehensive inpatient rehab setting. Physiatrist is providing close team supervision and 24 hour management of active medical problems listed below. Physiatrist and rehab team continue to assess barriers to discharge/monitor patient progress toward functional and medical goals  Care Tool:  Bathing  Bathing activity did not occur: Refused           Bathing assist       Upper Body Dressing/Undressing Upper body dressing   What is the patient wearing?: Pull over shirt    Upper body assist Assist Level: Supervision/Verbal cueing    Lower Body Dressing/Undressing Lower body dressing      What is the patient wearing?: Pants     Lower body assist  Assist for lower body dressing: Moderate Assistance - Patient 50 - 74%     Toileting Toileting    Toileting assist Assist for toileting: Moderate Assistance - Patient 50 - 74%     Transfers Chair/bed transfer  Transfers assist     Chair/bed transfer assist level: Minimal Assistance - Patient > 75%     Locomotion Ambulation   Ambulation assist      Assist level: Minimal Assistance - Patient > 75% Assistive device: Cane-straight Max distance: 146ft   Walk 10 feet activity   Assist     Assist level: Minimal Assistance - Patient > 75% Assistive device: Cane-straight   Walk 50 feet activity   Assist    Assist level: Minimal Assistance - Patient > 75% Assistive device: Cane-straight    Walk 150 feet activity   Assist    Assist level: Minimal Assistance - Patient > 75% Assistive device: Cane-straight    Walk 10 feet on uneven surface  activity   Assist      Assist level: Minimal Assistance - Patient > 75% Assistive device: Walker-rolling   Wheelchair     Assist Is the patient using a wheelchair?: Yes Type of Wheelchair: Manual    Wheelchair assist level: Contact Guard/Touching assist Max wheelchair distance: 50    Wheelchair 50 feet with 2 turns activity    Assist        Assist Level: Contact Guard/Touching assist   Wheelchair 150 feet activity     Assist      Assist Level: Maximal Assistance - Patient 25 - 49%   Blood pressure 121/67, pulse 66, temperature 97.6 F (36.4 C), resp. rate 16, height 6\' 1"  (1.854 m), weight 86.2 kg, SpO2 94%.    Medical Problem List and Plan: 1. Functional deficits secondary to debility due to CABG x3 due to STEMI             -patient may  shower-cover incisions             -ELOS/Goals: 7-10 days mod I             Grounds pass ordered   2.  Antithrombotics: -DVT/anticoagulation:  Pharmaceutical: Lovenox start renal dose Lovenox             -antiplatelet therapy: Aspirin and Plavix    3. Pain Management: Tylenol as needed   4. Mood/Behavior/Sleep: LCSW to evaluate and provide emotional support             -antipsychotic agents: n/a   5. Neuropsych/cognition: This patient is? capable of making decisions on his own behalf.   6. Skin/Wound Care: Routine skin care checks             -monitor chest and leg incisions, echymosis no evidence of infection R thigh    7. Fluids/Electrolytes/Nutrition: Routine Is and Os and follow-up chemistries             -continue potassium supplement   8: Hypertension: monitor TID and prn (meds see #10)   9: Hyperlipidemia: continue statin   10: CAD s/p CABG 8/07:             -sternal precautions             -continue amiodarone              -on aspirin and Plavix             -continue Lopressor             -  follow-up with Dr. Leafy Ro   11: BPH: continue Flomax   12: ABLA: follow-up CBC   13: Volume overload: TEE with normal LV  function and EF ~60-65% -continue diuresis with Lasix 40 mg daily, asked patient to weight patient today             -monitor volume status/daily weight   -weight reviewed and is 86.2 kg on 8/15, asked nursing to weigh patient 8/16 14: AKI: improving; follow-up BMP             Lasix d/ced by cardiology, discussed with patient  -discussed that creatinine is improving.     Latest Ref Rng & Units 09/10/2022    6:20 AM 09/09/2022    5:00 PM 09/07/2022    1:33 AM  BMP  Glucose 70 - 99 mg/dL 323  557  322   BUN 8 - 23 mg/dL 21  23  19    Creatinine 0.61 - 1.24 mg/dL 0.25  4.27  0.62   Sodium 135 - 145 mmol/L 134  135  134   Potassium 3.5 - 5.1 mmol/L 4.1  4.2  3.8   Chloride 98 - 111 mmol/L 100  95  100   CO2 22 - 32 mmol/L 26  27  25    Calcium 8.9 - 10.3 mg/dL 8.6  8.8  8.2      15: Prediabetes: continue carb modified diet, d/c CBG checks and insulin   16: pAF: continue amiodarone 400 mg Bid for two days, then 200 mg BID for seven days then continue on 200 mg daily at discharge, discussed this decrease which is set for 8/24 with patient and therapy, discussed that orthostasis will likely improve once this medication is decreased  -changed lopressor to Toprol XL at night             -follow-up with Dr. Dayle Points  17. Higher level cognitive issues complicated by [facility]- pt wants ot wait on SLP order for now.    18. Loose stool: d/c senna, resolved, continue psyllium  19. Orthostasis: changed Lopressor to Toprol XL HS, cardiology consulted        LOS: 6 days A FACE TO FACE EVALUATION WAS PERFORMED  Drema Pry Clariza Sickman 09/15/2022, 10:53 AM

## 2022-09-15 NOTE — Progress Notes (Signed)
Patient ID: Collin [name], male   DOB: 1940/02/07, 82 y.o.   MRN: [id number]  Met with pt and wife who is present to give them the team conference update regaridng goal of supervision-CGA and target discharge date of 8/30. Pt thought he would be going home sooner and feels his BP is his main issue and not his unsafe movement. Discussed along with [facility] his unsafe movement when getting up and what a high fall risk he currently is. Wife since here will attend therapy with him this afternoon. Discussed if meets his goals sooner can certainly move up his discharge date. Will continue to see how he is medically and moving functionally

## 2022-09-15 NOTE — Progress Notes (Signed)
Progress Note  Patient Name: Collin [name] Date of Encounter: 09/15/2022  Primary Cardiologist:   Tonny Bollman, MD   Subjective   He has some dizziness but he is not describing presyncope.  He has balance issues.   Inpatient Medications    Scheduled Meds:  amiodarone  200 mg Oral BID   Followed by   Melene Muller ON 09/18/2022] amiodarone  200 mg Oral Daily   ascorbic acid  250 mg Oral Daily   aspirin EC  81 mg Oral Daily   atorvastatin  80 mg Oral Daily   clopidogrel  75 mg Oral Daily   enoxaparin (LOVENOX) injection  40 mg Subcutaneous Q24H   melatonin  3 mg Oral QHS   metoprolol succinate  12.5 mg Oral QHS   pantoprazole  40 mg Oral Daily   potassium chloride  20 mEq Oral Daily   psyllium  1 packet Oral Daily   tamsulosin  0.4 mg Oral QPC supper   traZODone  25 mg Oral QHS   Continuous Infusions:  PRN Meds: acetaminophen, alum & mag hydroxide-simeth, diphenhydrAMINE, guaiFENesin-dextromethorphan, lidocaine, mouth rinse, prochlorperazine **OR** prochlorperazine **OR** prochlorperazine, sodium phosphate, traMADol   Vital Signs    Vitals:   09/14/22 1559 09/14/22 1601 09/14/22 1936 09/15/22 0458  BP: 109/62 101/61 (!) 141/69 121/67  Pulse: 69 77 69 66  Resp: 18 19 18 16   Temp:   97.8 F (36.6 C) 97.6 F (36.4 C)  TempSrc:      SpO2: 98% 98% 97% 94%  Weight:      Height:        Intake/Output Summary (Last 24 hours) at 09/15/2022 0937 Last data filed at 09/15/2022 0230 Gross per 24 hour  Intake --  Output 1100 ml  Net -1100 ml   Filed Weights   09/09/22 1537 09/10/22 1656 09/12/22 0500  Weight: 86.2 kg 87.4 kg 86.2 kg    Telemetry    NA - Personally Reviewed  ECG    NA - Personally Reviewed  Physical Exam   GEN: No acute distress.   Neck: No  JVD Cardiac: RRR, no murmurs, rubs, or gallops.  Respiratory: Clear  to auscultation bilaterally. GI: Soft, nontender, non-distended  MS: No  edema; No deformity. Neuro:  Nonfocal  Psych: Normal  affect   Labs    Chemistry Recent Labs  Lab 09/09/22 1700 09/10/22 0620  NA 135 134*  K 4.2 4.1  CL 95* 100  CO2 27 26  GLUCOSE 129* 111*  BUN 23 21  CREATININE 1.60* 1.38*  CALCIUM 8.8* 8.6*  PROT  --  5.9*  ALBUMIN  --  2.9*  AST  --  33  ALT  --  42  ALKPHOS  --  44  BILITOT  --  1.2  GFRNONAA 42* 50*  ANIONGAP 13 8     Hematology Recent Labs  Lab 09/10/22 0620 09/13/22 0620  WBC 10.0 11.5*  RBC 3.56* 3.63*  HGB 10.1* 10.3*  HCT 30.5* 31.3*  MCV 85.7 86.2  MCH 28.4 28.4  MCHC 33.1 32.9  RDW 13.1 13.2  PLT 332 456*    Cardiac EnzymesNo results for input(s): "TROPONINI" in the last 168 hours. No results for input(s): "TROPIPOC" in the last 168 hours.   BNPNo results for input(s): "BNP", "PROBNP" in the last 168 hours.   DDimer No results for input(s): "DDIMER" in the last 168 hours.   Radiology    No results found.  Cardiac Studies   NA  Patient Profile     82 y.o. male with history of remote inferior infarct in 1998, treated with POBA to RCA and medically managed mid LAD disease originally admitted [facility] on [facility]22 in the setting of inferior STEMI. LHC found distal LM and ostial LCX with LAD disease and patient underwent urgent CABG on the same day with Dr. Leafy Ro. He was extubated on 8/8 and IABP removed. Patient started on Amiodarone on 8/8 for afib prophylaxis in post surgical setting, later did have some paroxysmal afib. He was also noted with evidence of volume overload and was placed on Lasix 40mg  daily. Following surgery, patient with notable deconditioning and CIR was arranged with patient being admitted 8/15. By 8/19, patient noted to have orthostatic dizziness/hypotension prompting cardiology consult for management of GDMT.   Assessment & Plan    CAD s/p CABG 09/01/22:  Continue ASA, Plavix and Toprol.  No change in therapy.    Hypertension:  He has orthostatic hypotension.   Loop diuretic stopped yesterday.    I had a long  conversation with him.  I am not going to suggest any other change to his meds.  He be aware of situational dizziness and be ready to sit if this happens.  He can watch his BP at home and if he has continued problems with orthostasis he can notify our office and we will further adjust meds.  I agree with the compression garments.    Hyperlipidemia:    Continue current statin.     Paroxysmal atrial fibrillation:  No recurrence documented.  Reduce amio to 200 mg daily on 8/24.      Patient initially placed on amiodarone following CABG for afib prophylaxis but despite this did develop paroxysmal afib. Is now on 200mg  BID with plans to decrease to 200mg  daily starting 8/24.     For questions or updates, please contact [facility] Please consult [email or web address] for contact info under Cardiology/STEMI.   Signed, Rollene Rotunda, MD  09/15/2022, 9:37 AM

## 2022-09-15 NOTE — Progress Notes (Signed)
Physical Therapy Session Note  Patient Details  Name: Collin [name] MRN: [id number] Date of Birth: 1940-04-17  Today's Date: 09/15/2022 PT Individual Time: 0730-0840 PT Individual Time Calculation (min): 70 min   Short Term Goals: Week 1:  PT Short Term Goal 1 (Week 1): Patient will complete sit<>stand with CGA. PT Short Term Goal 1 - Progress (Week 1): Progressing toward goal PT Short Term Goal 2 (Week 1): Pt will ambulate >150' with CGA with RW on level surfaces. PT Short Term Goal 2 - Progress (Week 1): Progressing toward goal PT Short Term Goal 3 (Week 1): Patient will recall and verbalize sternal precautions.  Skilled Therapeutic Interventions/Progress Updates:    Chart reviewed and pt agreeable to therapy. Pt received seated in recliner with no c/o pain. Also of note, BP assessed at start of session without ab binder, results below. Pt + for OH, so TED hose and ab binder donned and pt reassessed. Pt required MaxA to done TED hose, socks, shoes, and ab binder. Session focused on functional standing in setting of sternal precautions to reduce risk of fall with home mobility. Pt initiated session with blocked practice of sit to stand. Pt given extensive education on need to balance weight over feet before fully standing. Pt required minA for sit to stand with no AD progressing to light CGA. Pt able to verbalize correct sequencing and observe appropriate position of weight over feet before rising. Pt completed multiple squats over feet to simulate rise from sitting with S. By end of practice, pt able to complete sit to stand with light CGA for 50% of attempts per round. Pt required rest breaks between sets 2/2 dizziness. Session education emphasized explanation of OH and energy conservation while healing. At end of session, pt was left seated in recliner with alarm engaged, nurse call bell and all needs in reach.  Pre-TED + Ab Binder: Sitting BP: 105/65mm Hg Standing BP: 71/5mm Hg  With  TED + Ab Binder: Sitting BP: 129/40mm Hg Standing BP: 97/30mm Hg    Therapy Documentation Precautions:  Precautions Precautions: Fall, Sternal Precaution Booklet Issued: Yes (comment) Precaution Comments: educated pt on sternal precautions Other Brace: wears L foot brace for h/o foot drop Restrictions Weight Bearing Restrictions: No RUE Weight Bearing: Weight bearing as tolerated LUE Weight Bearing: Weight bearing as tolerated RLE Weight Bearing: Weight bearing as tolerated LLE Weight Bearing: Weight bearing as tolerated Other Position/Activity Restrictions: sternal precautions General:       Therapy/Group: Individual Therapy  Dionne Milo, PT, DPT 09/15/2022, 11:34 AM

## 2022-09-16 ENCOUNTER — Other Ambulatory Visit: Payer: Self-pay | Admitting: Thoracic Surgery (Cardiothoracic Vascular Surgery)

## 2022-09-16 ENCOUNTER — Other Ambulatory Visit: Payer: Self-pay

## 2022-09-16 ENCOUNTER — Encounter (HOSPITAL_COMMUNITY): Payer: Self-pay | Admitting: Physical Medicine and Rehabilitation

## 2022-09-16 DIAGNOSIS — Z951 Presence of aortocoronary bypass graft: Secondary | ICD-10-CM

## 2022-09-16 MED ORDER — PROPOFOL 1000 MG/100ML IV EMUL
INTRAVENOUS | Status: AC
  Filled 2022-09-16: qty 200

## 2022-09-16 MED ORDER — PHENYLEPHRINE HCL-NACL 20-0.9 MG/250ML-% IV SOLN
INTRAVENOUS | Status: AC
  Filled 2022-09-16: qty 250

## 2022-09-16 NOTE — Progress Notes (Signed)
Occupational Therapy Session Note  Patient Details  Name: Collin [name] MRN: [id number] Date of Birth: 09-16-1940  Today's Date: 09/16/2022 OT Individual Time: 0900-1000 & [id number] OT Individual Time Calculation (min): 60 min & 70 min   Short Term Goals: Week 1:  OT Short Term Goal 1 (Week 1): Pt will don pants with CGA OT Short Term Goal 2 (Week 1): Pt will complete sit <> stand with min A consistently OT Short Term Goal 3 (Week 1): Pt will complete toileting tasks with min A  Skilled Therapeutic Interventions/Progress Updates:  Session 1 Skilled OT intervention completed with focus on BP management, functional transfers and global endurance. Pt received upright in bed, agreeable to session. No pain reported.  Pt declined taking a shower this AM, despite nursing notifying OT of pt request to complete this morning. BP assessed below for orthostatics; no abdominal binder needed this session.  Transitioned to EOB with supervision using good log roll technique. Several mins needed to adjust to position change, with MD present for rounds discussing orthostatic hypotension and CLOF also. Thigh TEDs donned with min A, shoes with total A and Lt foot drop brace. Stood using RW with min A after 3 attempts 2/2 posterior bias. Able to maintain stance with CGA for several mins for BP reading. CGA stand pivot > w/c with RW. Seated at sink, pt combed hair with set up A. Water provided during session.  Transported dependently in w/c <> gym for time. Per pt request of nustep activity. Min A sit > stand, and stand pivot <> nustep. Pt completed the following intervals on nustep to promote global/cardiovascular endurance and BP stability needed for independence with BADLs and functional mobility: -4 mins, BLE only, level 6 -4 mins, BLE only, level 6 Intermittent rest break needed for fatigue. Education needed for awareness deficit of thinking he can come to gym unattended to participate in exercises to "go  home sooner."  Back in room, direct care handoff to NT for toileting assist. All needs met at end of session.  Vitals BP 119/65 (semi upright in bed), HR 63 bpm; asymptomatic BP 110/61 (sitting EOB), HR 68 bpm; symptomatic BP 111/58 (standing with thigh TEDs only), HR 85 bpm; symptomatic BP 116/73 (seated after several transfers), asymptomatic BP 137/64 (after 4 mins on nustep), HR 70 bpm; slightly asymptomatic   Session 2 Skilled OT intervention completed with focus on functional endurance and ADL retraining within a shower context. Pt received seated in recliner, agreeable to session. No pain reported.  Pt agreeable to shower. BP assessed below during session. Several trials needed to stand with hands on thighs, with overall CGA without LOB. Ambulated using RW to shower, with max cues needed for positioning for side step > TTB. Max A needed to doff LB clothing.   Waterproof cover applied to sternal incision per order however pt only with glue and steri strips. Pt needed intermittent cues for avoidance of breaking sternal precautions during shower task including bringing chest towards his feet vs pulling them up, and avoiding reaching arms behind back for pericare. Discussed LH sponge for future shower for modified bathing. Pt remained seated for duration of shower for BP management with cues needed for lateral leaning. CGA sit > stand with using fingertips on grab bar only vs pulling up as pt stated being unable to stand without. CGA stand pivot to w/c.   Shaved with Neurosurgeon with set up A. Donned shirt with supervision. OT applied small bandage to Rt medial  thigh on small scab that got scrapped in shower per pt request but no active bleed. Donned LB with supervision via figure 4, however still required CGA to power up in standing with sink used for light UE support. CGA for balance to donn over hips. Donned socks, shoes with supervision/cues for avoiding pulling legs up to him and pushing  heel down for modification with shoe donning. Did not donn TEDs due to stable BP this session; nursing made aware.  Pt ambulated about 100 ft using RW without symptoms, after several trials to stand with min A. No formal LOB, however cues needed to correct ataxic gait and body positioning to RW.   Pt remained seated in recliner, with belt alarm on/activated, and with all needs in reach at end of session.  Vitals -BP 124/65 (seated with thigh TEDs), asymptomatic -BP 128/64 (after ambulation to shower without prophylactics), asymptomatic BP 129/73 (seated after shower/activity); asymptomatic BP 116/69 (in standing); asymptomatic   Therapy Documentation Precautions:  Precautions Precautions: Fall, Sternal Precaution Booklet Issued: Yes (comment) Precaution Comments: educated pt on sternal precautions Other Brace: wears L foot brace for h/o foot drop Restrictions Weight Bearing Restrictions: No Other Position/Activity Restrictions: sternal precautions    Therapy/Group: Individual Therapy  Melvyn Novas, MS, OTR/L  09/16/2022, 3:43 PM

## 2022-09-16 NOTE — Progress Notes (Signed)
Occupational Therapy Weekly Progress Note  Patient Details  Name: Collin [name] MRN: [id number] Date of Birth: [birth year]  Beginning of progress report period: September 10, 2022 End of progress report period: September 16, 2022   Patient has met 2 of 3 short term goals. Pt is making gradual progress towards LTGs. He is able to bathe seated at an overall min A level, dress UB with supervision, LB with min A including TEDs, and requires min/mod assist for toileting tasks. Since admission, pt has been largely limited by lightheadedness and orthostatic hypotension, however prophylactics including thigh TEDs and abdominal binder with fluids pushed, has helped. He continues to demo difficulty with standing from all surfaces 2/2 posterior bias, with decreased insight into his deficits despite repeated education. He also has global endurance, coordination, strength deficits with difficulty completing ADLs within his sternal precautions. Pt will benefit from continued skilled OT services to focus on mentioned deficits. Pt's wife has attended 1 session for observation, however formal training to come prior to DC.   Patient continues to demonstrate the following deficits: muscle weakness, decreased cardiorespiratoy endurance, decreased coordination, decreased awareness, decreased problem solving, and decreased memory, and decreased standing balance and difficulty maintaining precautions and therefore will continue to benefit from skilled OT intervention to enhance overall performance with BADL and Reduce care partner burden.  Patient progressing toward long term goals..  Continue plan of care.  OT Short Term Goals Week 1:  OT Short Term Goal 1 (Week 1): Pt will don pants with CGA OT Short Term Goal 1 - Progress (Week 1): Met OT Short Term Goal 2 (Week 1): Pt will complete sit <> stand with min A consistently OT Short Term Goal 2 - Progress (Week 1): Met OT Short Term Goal 3 (Week 1): Pt will complete  toileting tasks with min A OT Short Term Goal 3 - Progress (Week 1): Not met Week 2:  OT Short Term Goal 1 (Week 2): STG = LTG 2/2 ELOS   Audery Wassenaar E Ahnna Dungan, MS, OTR/L  09/16/2022, 3:44 PM

## 2022-09-16 NOTE — Progress Notes (Signed)
Physical Therapy Session Note  Patient Details  Name: Collin [name] MRN: [id number] Date of Birth: 1940/07/07  Today's Date: 09/16/2022 PT Individual Time: 1100-1157 PT Individual Time Calculation (min): 57 min   Short Term Goals: Week 1:  PT Short Term Goal 1 (Week 1): Patient will complete sit<>stand with CGA. PT Short Term Goal 1 - Progress (Week 1): Progressing toward goal PT Short Term Goal 2 (Week 1): Pt will ambulate >150' with CGA with RW on level surfaces. PT Short Term Goal 2 - Progress (Week 1): Progressing toward goal PT Short Term Goal 3 (Week 1): Patient will recall and verbalize sternal precautions.  Skilled Therapeutic Interventions/Progress Updates:   Received pt sitting in recliner with thigh high teds donned. Pt agreeable to PT treatment and denied any pain during session just fatigue. Session with emphasis on functional mobility/transfers, generalized strengthening and endurance, dynamic standing balance/coordination, and gait training. Pt performed all transfers with min A throughout session - pt continues to demonstrate intermittent posterior LOB when coming into standing, requiring occasional assist to correct. Pt ambulated 19ft with SPC and min A to WC - reporting legs felt "heavy".   Pt transported to dayroom in [facility] dependently for time management purposes. Stood from [facility] with SPC and ambulated 165ft with SPC and min A - noted narrow BOS and intermittent scissoring with generalized unsteadiness and required cues to keep SPC in contact with floor. Stood from [facility] with SPC and navigated through obstacle course consisting of weaving in/out of cones and stepping over objects with SPC and min A (pt with 1 large LOB requiring mod A to correct). Took seated rest break, then navigated through obstacle course again with RW and CGA/light min A - cues to remain within RW BOS to avoid tripping on RW legs. Pt required multiple rest/water breaks throughout session and reported LLE felt  "funny" when ambulating. Worked on static and dynamic standing balance dribbling basketball for 1 minute x 3 trials with min A overall - emphasis on reaction time and coordination.   Ambulated to/from Kinetron, stepped on, and sat down and performed seated BLE strengthening on Kinetron at 20 cm/sec for 2 minutes with emphasis on glute/quad strength. Stepped off Kinetron and ambulated 130ft with SPC and min A back to room. Provided pt with 3 exercises to work on in between therapy sessions (LAQ, hip flexion, and hip adduction pillow squeezes). Concluded session with pt sitting in recliner, needs within reach, and seatbelt alarm on.   Vitals: BP sitting in recliner: 100/68 - pt reported mild lightheadedness BP standing at recliner: 102/61 - symptoms remained the same BP sitting after obstacle course: 131/73 - symptoms gradually improving BP sitting after dribbling basketball: 124/69 - symptoms gradually improving  Therapy Documentation Precautions:  Precautions Precautions: Fall, Sternal Precaution Booklet Issued: Yes (comment) Precaution Comments: educated pt on sternal precautions Other Brace: wears L foot brace for h/o foot drop Restrictions Weight Bearing Restrictions: No RUE Weight Bearing: Weight bearing as tolerated LUE Weight Bearing: Weight bearing as tolerated RLE Weight Bearing: Weight bearing as tolerated LLE Weight Bearing: Weight bearing as tolerated Other Position/Activity Restrictions: sternal precautions  Therapy/Group: Individual Therapy Marlana Salvage Zaunegger Blima Rich PT, DPT 09/16/2022, 6:54 AM

## 2022-09-16 NOTE — Progress Notes (Signed)
PROGRESS NOTE   Subjective/Complaints: Patient would prefer earlier discharge but also wants to be ready for d/c He is about to stand with therapy now and have his BP measured   ROS: denies CP, Mild DOE, no N/V/abd pain, no jt pain, +orthostasis with lightheadedness   Objective:   No results found. No results for input(s): "WBC", "HGB", "HCT", "PLT" in the last 72 hours.  No results for input(s): "NA", "K", "CL", "CO2", "GLUCOSE", "BUN", "CREATININE", "CALCIUM" in the last 72 hours.   Intake/Output Summary (Last 24 hours) at 09/16/2022 1012 Last data filed at 09/16/2022 0837 Gross per 24 hour  Intake 838 ml  Output 1525 ml  Net -687 ml        Physical Exam: Vital Signs Blood pressure 126/73, pulse 65, temperature 98.7 F (37.1 C), temperature source Oral, resp. rate 15, height 6\' 1"  (1.854 m), weight 84.4 kg, SpO2 97%. Gen: no distress, normal appearing HEENT: oral mucosa pink and moist, NCAT Cardio: Reg rate Chest: normal effort, normal rate of breathing Abd: soft, non-distended Ext: no edema Ext: no edema, thigh high teds donned Psych: pleasant, normal affect Skin: intact Musculoskeletal:        General: No swelling.     Cervical back: Neck supple. No tenderness.     Comments: UE strength 5-/5 B/L- painful with R deltoid- but still 4+/5 Les- HF 5-/5; KE 5-/5; R DF and PF 5-/5 L DF 3+/5 and PF 5-/5   Skin:    General: Skin is warm and dry.     Comments: Sternal incision looks great Incision on R leg x2- near groin and R calf- look good Severe purple bruising posterior proximal Thigh on R- medial scab over the saphenous v region , no induration no eythema no tenderness to palpation  2 IV's LUE- look OK  Neurological:     General: No focal deficit present.     Mental Status: He is alert and oriented to person, place, and time.     Comments: Decreased to light touch from knees to toes B/L- worse on  LLE Higher level cognition- slightly delayed responses- even with repetition-  Cannot follow 3 step command, but can 1 step and slowed on 2 step command.   Psychiatric:        Mood and Affect: Mood normal.        Behavior: Behavior normal.  GU: normal stools   Assessment/Plan: 1. Functional deficits which require 3+ hours per day of interdisciplinary therapy in a comprehensive inpatient rehab setting. Physiatrist is providing close team supervision and 24 hour management of active medical problems listed below. Physiatrist and rehab team continue to assess barriers to discharge/monitor patient progress toward functional and medical goals  Care Tool:  Bathing  Bathing activity did not occur: Refused           Bathing assist       Upper Body Dressing/Undressing Upper body dressing   What is the patient wearing?: Pull over shirt    Upper body assist Assist Level: Supervision/Verbal cueing    Lower Body Dressing/Undressing Lower body dressing      What is the patient wearing?: Pants  Lower body assist Assist for lower body dressing: Moderate Assistance - Patient 50 - 74%     Toileting Toileting    Toileting assist Assist for toileting: Moderate Assistance - Patient 50 - 74%     Transfers Chair/bed transfer  Transfers assist     Chair/bed transfer assist level: Minimal Assistance - Patient > 75%     Locomotion Ambulation   Ambulation assist      Assist level: Minimal Assistance - Patient > 75% Assistive device: Cane-straight Max distance: 132ft   Walk 10 feet activity   Assist     Assist level: Minimal Assistance - Patient > 75% Assistive device: Cane-straight   Walk 50 feet activity   Assist    Assist level: Minimal Assistance - Patient > 75% Assistive device: Cane-straight    Walk 150 feet activity   Assist    Assist level: Minimal Assistance - Patient > 75% Assistive device: Cane-straight    Walk 10 feet on uneven  surface  activity   Assist     Assist level: Minimal Assistance - Patient > 75% Assistive device: Walker-rolling   Wheelchair     Assist Is the patient using a wheelchair?: Yes Type of Wheelchair: Manual    Wheelchair assist level: Contact Guard/Touching assist Max wheelchair distance: 50    Wheelchair 50 feet with 2 turns activity    Assist        Assist Level: Contact Guard/Touching assist   Wheelchair 150 feet activity     Assist      Assist Level: Maximal Assistance - Patient 25 - 49%   Blood pressure 126/73, pulse 65, temperature 98.7 F (37.1 C), temperature source Oral, resp. rate 15, height 6\' 1"  (1.854 m), weight 84.4 kg, SpO2 97%.    Medical Problem List and Plan: 1. Functional deficits secondary to debility due to CABG x3 due to STEMI             -patient may  shower-cover incisions             -ELOS/Goals: 7-10 days mod I             Grounds pass ordered  Team conference note entered   2.  Antithrombotics: -DVT/anticoagulation:  Pharmaceutical: Lovenox start renal dose Lovenox             -antiplatelet therapy: Aspirin and Plavix    3. Pain Management: Tylenol as needed   4. Mood/Behavior/Sleep: LCSW to evaluate and provide emotional support             -antipsychotic agents: n/a   5. Neuropsych/cognition: This patient is? capable of making decisions on his own behalf.   6. Skin/Wound Care: Routine skin care checks             -monitor chest and leg incisions, echymosis no evidence of infection R thigh    7. Fluids/Electrolytes/Nutrition: Routine Is and Os and follow-up chemistries             -continue potassium supplement   8: Hypertension: monitor TID and prn (meds see #10)   9: Hyperlipidemia: continue statin   10: CAD s/p CABG 8/07:             -sternal precautions             -continue amiodarone              -on aspirin and Plavix             -continue Lopressor             -  follow-up with Dr. Leafy Ro   11: BPH:  continue Flomax   12: ABLA: follow-up CBC   13: Volume overload: TEE with normal LV function and EF ~60-65% -continue diuresis with Lasix 40 mg daily, asked patient to weight patient today             -monitor volume status/daily weight   -weight reviewed and is 86.2 kg on 8/15, asked nursing to weigh patient 8/16 14: AKI: improving; follow-up BMP             Lasix d/ced by cardiology, discussed with patient  -discussed that creatinine is improving.     Latest Ref Rng & Units 09/10/2022    6:20 AM 09/09/2022    5:00 PM 09/07/2022    1:33 AM  BMP  Glucose 70 - 99 mg/dL 161  096  045   BUN 8 - 23 mg/dL 21  23  19    Creatinine 0.61 - 1.24 mg/dL 4.09  8.11  9.14   Sodium 135 - 145 mmol/L 134  135  134   Potassium 3.5 - 5.1 mmol/L 4.1  4.2  3.8   Chloride 98 - 111 mmol/L 100  95  100   CO2 22 - 32 mmol/L 26  27  25    Calcium 8.9 - 10.3 mg/dL 8.6  8.8  8.2      15: Prediabetes: continue carb modified diet, d/c CBG checks and insulin   16: pAF: continue amiodarone 400 mg Bid for two days, then 200 mg BID for seven days then continue on 200 mg daily at discharge, discussed this decrease which is set for 8/24 with patient and therapy, discussed that orthostasis will likely improve once this medication is decreased  -changed lopressor to Toprol XL at night, no further recommendations as per cardiology.              -follow-up with Dr. Dayle Points  17. Higher level cognitive issues complicated by [facility]- pt wants ot wait on SLP order for now.    18. Loose stool: d/c senna, resolved, continue psyllium, reviewed and last BM was 8/21  19. Orthostasis: changed Lopressor to Toprol XL HS, cardiology consulted, lasix d/ced  I spent >64mins performing patient care related activities, including face to face time, documentation time, med management, discussion of meds and lab orders with patient, and overall coordination of care.         LOS: 7 days A FACE TO FACE EVALUATION WAS  PERFORMED  Mister Krahenbuhl P Chanson Teems 09/16/2022, 10:12 AM

## 2022-09-17 MED FILL — Lidocaine HCl Local Soln Prefilled Syringe 100 MG/5ML (2%): INTRAMUSCULAR | Qty: 5 | Status: AC

## 2022-09-17 MED FILL — Magnesium Sulfate Inj 50%: INTRAMUSCULAR | Qty: 10 | Status: AC

## 2022-09-17 MED FILL — Electrolyte-R (PH 7.4) Solution: INTRAVENOUS | Qty: 3000 | Status: AC

## 2022-09-17 MED FILL — Heparin Sodium (Porcine) Inj 1000 Unit/ML: INTRAMUSCULAR | Qty: 10 | Status: AC

## 2022-09-17 MED FILL — Sodium Bicarbonate IV Soln 8.4%: INTRAVENOUS | Qty: 50 | Status: AC

## 2022-09-17 MED FILL — Mannitol IV Soln 20%: INTRAVENOUS | Qty: 500 | Status: AC

## 2022-09-17 MED FILL — Sodium Chloride IV Soln 0.9%: INTRAVENOUS | Qty: 2000 | Status: AC

## 2022-09-17 NOTE — Progress Notes (Signed)
Progress Note  Patient Name: Collin [name] Date of Encounter: 09/17/2022  Primary Cardiologist:   Tonny Bollman, MD   Subjective   Denies pain or SOB.   Mildly dizzy with PT.    Inpatient Medications    Scheduled Meds:  amiodarone  200 mg Oral BID   Followed by   Melene Muller ON 09/18/2022] amiodarone  200 mg Oral Daily   ascorbic acid  250 mg Oral Daily   aspirin EC  81 mg Oral Daily   atorvastatin  80 mg Oral Daily   clopidogrel  75 mg Oral Daily   enoxaparin (LOVENOX) injection  40 mg Subcutaneous Q24H   magnesium gluconate  250 mg Oral QHS   melatonin  3 mg Oral QHS   metoprolol succinate  12.5 mg Oral QHS   pantoprazole  40 mg Oral Daily   potassium chloride  20 mEq Oral Daily   psyllium  1 packet Oral Daily   tamsulosin  0.4 mg Oral QPC supper   traZODone  25 mg Oral QHS   Continuous Infusions:  PRN Meds: acetaminophen, alum & mag hydroxide-simeth, diphenhydrAMINE, guaiFENesin-dextromethorphan, lidocaine, mouth rinse, prochlorperazine **OR** prochlorperazine **OR** prochlorperazine, sodium phosphate, traMADol   Vital Signs    Vitals:   09/16/22 1936 09/16/22 2125 09/17/22 0500 09/17/22 0509  BP: 133/79 120/65  125/70  Pulse: 66 71  64  Resp: 18   18  Temp: 97.8 F (36.6 C)   98.4 F (36.9 C)  TempSrc:      SpO2: 100%   98%  Weight:   86.2 kg   Height:        Intake/Output Summary (Last 24 hours) at 09/17/2022 0818 Last data filed at 09/17/2022 0752 Gross per 24 hour  Intake 535 ml  Output 2075 ml  Net -1540 ml   Filed Weights   09/12/22 0500 09/16/22 0608 09/17/22 0500  Weight: 86.2 kg 84.4 kg 86.2 kg    Telemetry    NA - Personally Reviewed  ECG    NA - Personally Reviewed  Physical Exam   GEN: No  acute distress.   Neck: No  JVD Cardiac: RRR, no murmurs, rubs, or gallops.  Respiratory: Clear   to auscultation bilaterally. GI: Soft, nontender, non-distended, normal bowel sounds  MS:   edema; No deformity. Neuro:   Nonfocal ,  diffuse weakness Psych: Oriented and appropriate    Labs    Chemistry No results for input(s): "NA", "K", "CL", "CO2", "GLUCOSE", "BUN", "CREATININE", "CALCIUM", "PROT", "ALBUMIN", "AST", "ALT", "ALKPHOS", "BILITOT", "GFRNONAA", "GFRAA", "ANIONGAP" in the last 168 hours.    Hematology Recent Labs  Lab 09/13/22 0620  WBC 11.5*  RBC 3.63*  HGB 10.3*  HCT 31.3*  MCV 86.2  MCH 28.4  MCHC 32.9  RDW 13.2  PLT 456*    Cardiac EnzymesNo results for input(s): "TROPONINI" in the last 168 hours. No results for input(s): "TROPIPOC" in the last 168 hours.   BNPNo results for input(s): "BNP", "PROBNP" in the last 168 hours.   DDimer No results for input(s): "DDIMER" in the last 168 hours.   Radiology    No results found.  Cardiac Studies   NA  Patient Profile     82 y.o. male with history of remote inferior infarct in 1998, treated with POBA to RCA and medically managed mid LAD disease originally [facility] in the setting of inferior STEMI. LHC found distal LM and ostial LCX with LAD disease and patient underwent urgent  CABG on the same day with Dr. Leafy Ro. He was extubated on 8/8 and IABP removed. Patient started on Amiodarone on 8/8 for afib prophylaxis in post surgical setting, later did have some paroxysmal afib. He was also noted with evidence of volume overload and was placed on Lasix 40mg  daily. Following surgery, patient with notable deconditioning and CIR was arranged with patient being admitted 8/15. By 8/19, patient noted to have orthostatic dizziness/hypotension prompting cardiology consult for management of GDMT.   Assessment & Plan    CAD s/p CABG 09/01/22:  Continue ASA, Plavix and Toprol.    Hypertension:    Doing better off diuretic.    He has orthostatic hypotension.    Needs compression garments when up (abdominal binder and thigh high compression if possible. Otherwise he is tolerating the meds as listed I will continue these.     Hyperlipidemia:     Continue current statin.     Paroxysmal atrial fibrillation:  No recurrence documented.  Reduce amio to 200 mg starting tomorrow. .      Follow up:  He has office follow up next week and we will move those appts out since he is still in hospital.    For questions or updates, please contact [facility] Please consult [email or web address] for contact info under Cardiology/STEMI.   Signed, Rollene Rotunda, MD  09/17/2022, 8:18 AM

## 2022-09-17 NOTE — Progress Notes (Addendum)
Occupational Therapy Session Note  Patient Details  Name: Collin [name] MRN: [id number] Date of Birth: 04/01/1940  Today's Date: 09/17/2022 OT Individual Time: 0905-1000 & 1425-1530 OT Individual Time Calculation (min): 55 min & 65 min OT missed time: 10 min Missed time reason: Delay in service  Short Term Goals: Week 2:  OT Short Term Goal 1 (Week 2): STG = LTG 2/2 ELOS  Skilled Therapeutic Interventions/Progress Updates:  Session 1 Skilled OT intervention completed with focus on BP management, LB dressing education, functional ambulation, sit > stands. Pt received supine in bed, agreeable to session. No pain reported.  Transitioned to EOB with supervision via log roll with good technique. BP assessed during session (see below for changes). Decided to donn thigh TEDs in prep for mobility 2/2 pt's trend to drop, despite lack of symptoms > EOB transition. Education provided on self-donning technique within his sternal precautions, via figure 4 position. Cues needed throughout for avoidance of pulling heavily on the sock, vs working over the toes/heel gradually at a time. Difficulty with maintaining RLE on top of LLE 2/2 hip tightness and would benefit from stretches, as well as core weakness noted as pt with posterior bias when unsupported but overall able to donn with supervision with time. Shoes donned and laced with supervision and time also.  MD present for rounds. Pt requesting to complete BLE exercises at EOB prior to mobility to "loosen the legs." Cues needed for form, but pt able to complete bilateral LAQ, marching in place and adduction squeezes x15 each. 4 trials needed to stand with RW 2/2 posterior LOB, requiring min A to correct on last trial. CGA stand pivot > w/c. Transported dependently in w/c > gym for time.  Min A sit > stand with pt using back of knees to power up therefore had posterior LOB requiring min A to correct. CGA stand pivot with RW > EOM. Blocked practice for mini  squats to prep for sit <> stands. Initially pt unable to achieve mini squat from sitting with hands on thighs without posterior bias. Cues needed for shifting weight into bilateral toes, squeezing glutes, pt able to complete 4x5 with 5 sec hold squat position with CGA only. Placed RW in front of pt with use of mirror for visual feedback, then pt progressed to full stand, with pt needing CGA for x5 stands with great form and without LOB. Ambulated with CGA back to room using RW with cues for upright gaze.   Pt remained seated in recliner, with belt alarm on/activated, and with all needs in reach at end of session.  Vitals BP 100/56 (seated EOB), HR 72 bpm BP 116/72 (seated after transfer to w/c; with thigh TEDs), HR 73 bpm BP 130/73 (activity/ambulation)  Session 2 Skilled OT intervention completed with focus on cardiovascular endurance, functional ambulation, cognitive strategies. Pt received seated in recliner, agreeable to session. No pain reported. Pt missed 10 mins of OT intervention 2/2 delay in service; will make up missed mins as able.  Pt declined self-care needs. Required x5 trials to stand successfully despite re-education on earlier technique of anterior weight shifting, weight into toes, and glute activation. Required CGA with hands on thighs, then used RW to ambulate with CGA > w/c in room with cues for walking within the frame of the RW.  Transported dependently in w/c > gym for time. Min A sit > stand using RW and CGA stand pivot > nustep. Pt completed the following intervals on nustep to promote global/cardiovascular endurance  and BP stability needed for independence with BADLs and functional mobility: -5 mins, level 6 -5 mins, level 6 Intermittent rest break needed for fatigue. Water issued. Education provided about RPE meaning and the "walk & talk" method to determine how much energy he has in the tank during ambulation or ADLs.  CGA sit > stand and ambulatory transfer to w/c  with RW. Pt shared he has a history of cognitive impairments and he recognizes that having the CABG surgery has caused more confusion. Pt participated in the following activities in sitting to address cognitive strategies needed for independence and safety with BADL management: -Motor speed dot test- 2 sec, 2 misses; no difficulty -Bell cancellation test- 4:50 min; 1 misses, no difficulty -Trail Making A- min difficulty  -Trail Making B- mod difficulty  Transported dependently in w/c to hallway of room. Pt ambulated about 100 ft using RW with min A for sit > stand but CGA > recliner in room. Pt remained seated in recliner, with belt alarm on/activated, and with all needs in reach at end of session.  Vitals BP 118/62 (seated with thigh TEDs at rest)  Therapy Documentation Precautions:  Precautions Precautions: Fall, Sternal Precaution Booklet Issued: Yes (comment) Precaution Comments: educated pt on sternal precautions Other Brace: wears L foot brace for h/o foot drop Restrictions Weight Bearing Restrictions: No Other Position/Activity Restrictions: sternal precautions    Therapy/Group: Individual Therapy  Melvyn Novas, MS, OTR/L  09/17/2022, 3:39 PM

## 2022-09-17 NOTE — Progress Notes (Signed)
PROGRESS NOTE   Subjective/Complaints: Dizziness still present but improving, discussed that I feel it will greatly improve after amiodarone decrease tomorrow, discussed rationale for this medication   ROS: denies CP, Mild DOE, no N/V/abd pain, no jt pain, +orthostasis with lightheadedness- improved but continues   Objective:   No results found. No results for input(s): "WBC", "HGB", "HCT", "PLT" in the last 72 hours.  No results for input(s): "NA", "K", "CL", "CO2", "GLUCOSE", "BUN", "CREATININE", "CALCIUM" in the last 72 hours.   Intake/Output Summary (Last 24 hours) at 09/17/2022 1146 Last data filed at 09/17/2022 0949 Gross per 24 hour  Intake 535 ml  Output 1925 ml  Net -1390 ml        Physical Exam: Vital Signs Blood pressure 125/70, pulse 64, temperature 98.4 F (36.9 C), resp. rate 18, height 6\' 1"  (1.854 m), weight 86.2 kg, SpO2 98%. Gen: no distress, normal appearing HEENT: oral mucosa pink and moist, NCAT Cardio: Reg rate Chest: normal effort, normal rate of breathing Abd: soft, non-distended Ext: no edema Psych: pleasant, normal affect Ext: no edema, thigh high teds donned Psych: pleasant, normal affect Skin: intact Musculoskeletal:        General: No swelling.     Cervical back: Neck supple. No tenderness.     Comments: UE strength 5-/5 B/L- painful with R deltoid- but still 4+/5 Les- HF 5-/5; KE 5-/5; R DF and PF 5-/5 L DF 3+/5 and PF 5-/5   Skin:    General: Skin is warm and dry.     Comments: Sternal incision looks great Incision on R leg x2- near groin and R calf- look good Severe purple bruising posterior proximal Thigh on R- medial scab over the saphenous v region , no induration no eythema no tenderness to palpation  2 IV's LUE- look OK  Neurological:     General: No focal deficit present.     Mental Status: He is alert and oriented to person, place, and time.     Comments: Decreased to  light touch from knees to toes B/L- worse on LLE Higher level cognition- slightly delayed responses- even with repetition-  Cannot follow 3 step command, but can 1 step and slowed on 2 step command.   Psychiatric:        Mood and Affect: Mood normal.        Behavior: Behavior normal.  GU: normal stools   Assessment/Plan: 1. Functional deficits which require 3+ hours per day of interdisciplinary therapy in a comprehensive inpatient rehab setting. Physiatrist is providing close team supervision and 24 hour management of active medical problems listed below. Physiatrist and rehab team continue to assess barriers to discharge/monitor patient progress toward functional and medical goals  Care Tool:  Bathing  Bathing activity did not occur: Refused Body parts bathed by patient: Right arm, Left arm, Chest, Abdomen, Front perineal area, Buttocks, Right upper leg, Left upper leg, Right lower leg, Left lower leg, Face         Bathing assist Assist Level: Supervision/Verbal cueing     Upper Body Dressing/Undressing Upper body dressing   What is the patient wearing?: Pull over shirt    Upper body assist  Assist Level: Supervision/Verbal cueing    Lower Body Dressing/Undressing Lower body dressing      What is the patient wearing?: Underwear/pull up, Pants     Lower body assist Assist for lower body dressing: Contact Guard/Touching assist     Toileting Toileting    Toileting assist Assist for toileting: Moderate Assistance - Patient 50 - 74%     Transfers Chair/bed transfer  Transfers assist     Chair/bed transfer assist level: Minimal Assistance - Patient > 75%     Locomotion Ambulation   Ambulation assist      Assist level: Minimal Assistance - Patient > 75% Assistive device: Cane-straight Max distance: 134ft   Walk 10 feet activity   Assist     Assist level: Minimal Assistance - Patient > 75% Assistive device: Cane-straight   Walk 50 feet  activity   Assist    Assist level: Minimal Assistance - Patient > 75% Assistive device: Cane-straight    Walk 150 feet activity   Assist    Assist level: Minimal Assistance - Patient > 75% Assistive device: Cane-straight    Walk 10 feet on uneven surface  activity   Assist     Assist level: Minimal Assistance - Patient > 75% Assistive device: Walker-rolling   Wheelchair     Assist Is the patient using a wheelchair?: Yes Type of Wheelchair: Manual    Wheelchair assist level: Contact Guard/Touching assist Max wheelchair distance: 50    Wheelchair 50 feet with 2 turns activity    Assist        Assist Level: Contact Guard/Touching assist   Wheelchair 150 feet activity     Assist      Assist Level: Maximal Assistance - Patient 25 - 49%   Blood pressure 125/70, pulse 64, temperature 98.4 F (36.9 C), resp. rate 18, height 6\' 1"  (1.854 m), weight 86.2 kg, SpO2 98%.    Medical Problem List and Plan: 1. Functional deficits secondary to debility due to CABG x3 due to STEMI             -patient may  shower-cover incisions             -ELOS/Goals: 7-10 days mod I             Grounds pass ordered  Team conference note entered   2.  Antithrombotics: -DVT/anticoagulation:  Pharmaceutical: Lovenox start renal dose Lovenox             -antiplatelet therapy: Aspirin and Plavix    3. Pain Management: Tylenol as needed   4. Mood/Behavior/Sleep: LCSW to evaluate and provide emotional support             -antipsychotic agents: n/a   5. Neuropsych/cognition: This patient is? capable of making decisions on his own behalf.   6. Skin/Wound Care: Routine skin care checks             -monitor chest and leg incisions, echymosis no evidence of infection R thigh    7. Fluids/Electrolytes/Nutrition: Routine Is and Os and follow-up chemistries             -continue potassium supplement   8: Hypertension: monitor TID and prn (meds see #10)   9:  Hyperlipidemia: continue statin   10: CAD s/p CABG 8/07:             -sternal precautions             -continue amiodarone              -  on aspirin and Plavix             -continue Lopressor             -follow-up with Dr. Leafy Ro   11: BPH: continue Flomax   12: ABLA: follow-up CBC   13: Volume overload: TEE with normal LV function and EF ~60-65% -continue diuresis with Lasix 40 mg daily, asked patient to weight patient today             -monitor volume status/daily weight   -weight reviewed and is 86.2 kg on 8/15, asked nursing to weigh patient 8/16 14: AKI: improving; follow-up BMP             Lasix d/ced by cardiology, discussed with patient  -discussed that creatinine is improving.     Latest Ref Rng & Units 09/10/2022    6:20 AM 09/09/2022    5:00 PM 09/07/2022    1:33 AM  BMP  Glucose 70 - 99 mg/dL 213  086  578   BUN 8 - 23 mg/dL 21  23  19    Creatinine 0.61 - 1.24 mg/dL 4.69  6.29  5.28   Sodium 135 - 145 mmol/L 134  135  134   Potassium 3.5 - 5.1 mmol/L 4.1  4.2  3.8   Chloride 98 - 111 mmol/L 100  95  100   CO2 22 - 32 mmol/L 26  27  25    Calcium 8.9 - 10.3 mg/dL 8.6  8.8  8.2      15: Prediabetes: continue carb modified diet, d/c CBG checks and insulin   16: pAF: continue amiodarone 400 mg Bid for two days, then 200 mg BID for seven days then continue on 200 mg daily at discharge, discussed this decrease which is set for 8/24 with patient and therapy, discussed that orthostasis will likely improve once this medication is decreased  -changed lopressor to Toprol XL at night, no further recommendations as per cardiology.              -follow-up with Dr. Dayle Points  -continue magnesium supplement  17. Higher level cognitive issues complicated by [facility]- pt wants ot wait on SLP order for now.    18. Loose stool: d/c senna, resolved, continue psyllium, reviewed and last BM was 8/21, encouraged drinking 6-8 glasses of water per day  19. Orthostasis: changed Lopressor to  Toprol XL HS, cardiology consulted, lasix d/ced, continue teds and abdominal binder       LOS: 8 days A FACE TO FACE EVALUATION WAS PERFORMED  Drema Pry Jackline Castilla 09/17/2022, 11:46 AM

## 2022-09-17 NOTE — Progress Notes (Signed)
Physical Therapy Weekly Progress Note  Patient Details  Name: Collin [name] MRN: [id number] Date of Birth: August 07, 1940  Beginning of progress report period: September 10, 2022 End of progress report period: September 17, 2022  Today's Date: 09/17/2022 PT Individual Time: 1102-1159 PT Individual Time Calculation (min): 57 min   Patient has met 3 of 3 short term goals. Pt demonstrates gradual progress towards long term goals. Pt is currently able to perform bed mobility with supervision, transfers with RW and SPC with as little as CGA/min A, ambulate >138ft with RW and CGA and >118ft with SPC and min A, and navigate 8 6in steps with 2 handrails and min A. Pt has been limited primarily by orthostatic hypotension resulting in increased lightheadedness. Pt demonstrates decreased insight into balance deficits and continues to struggle with transition from sit<>stand from lower surfaces, resulting in retropulsion requiring hands on assist to prevent LOB. Pt will benefit from family education with wife prior to discharge.   Patient continues to demonstrate the following deficits muscle weakness, decreased cardiorespiratoy endurance, and decreased standing balance, decreased postural control, decreased balance strategies, and difficulty maintaining precautions and therefore will continue to benefit from skilled PT intervention to increase functional independence with mobility.  Patient progressing toward long term goals..  Continue plan of care.  PT Short Term Goals Week 1:  PT Short Term Goal 1 (Week 1): Patient will complete sit<>stand with CGA. PT Short Term Goal 1 - Progress (Week 1): Met PT Short Term Goal 2 (Week 1): Pt will ambulate >150' with CGA with RW on level surfaces. PT Short Term Goal 2 - Progress (Week 1): Met PT Short Term Goal 3 (Week 1): Patient will recall and verbalize sternal precautions. PT Short Term Goal 3 - Progress (Week 1): Met Week 2:  PT Short Term Goal 1 (Week 2): STG=LTG  due to LOS  Skilled Therapeutic Interventions/Progress Updates:  Ambulation/gait training;Balance/vestibular training;Cognitive remediation/compensation;Community reintegration;Discharge planning;Disease management/prevention;DME/adaptive equipment instruction;Functional electrical stimulation;Functional mobility training;Neuromuscular re-education;Pain management;Patient/family education;Stair training;Therapeutic Activities;Therapeutic Exercise;Splinting/orthotics;UE/LE Strength taining/ROM;UE/LE Coordination activities;Wheelchair propulsion/positioning   Today's Interventions: Received pt sitting in recliner with thigh high teds on. Pt agreeable to PT treatment and denied any pain during session. Session with emphasis on functional mobility/transfers, generalized strengthening and endurance, dynamic standing balance/coordination, NMR, and gait training. Pt required significantly increased time, numerous attempts (8), and min A to stand from recliner due to continued retropulsion. Despite continued reinforcement with technique for sit<>stands, pt remains inconsistent.  Pt ambulated 171ft x 2 trials with RW and CGA to/from main therapy gym. Pt ambulated to/from // bars with SPC and min A and performed the following activities inside // bars with emphasis on dynamic standing balance/coordination, proprioception, and reaction time: -forward high knee marching x 4 laps in agility ladder with BUE support fading to 1UE support and min A -lateral side stepping x 4 laps in agility ladder with BUE support fading to 1UE support and min A -forward tandem walking on Airex x 4 laps with BUE support fading to 1UE support and min A -backwards tandem walking on Airex x 4 laps with BUE support and min A -static standing balance on Rockerboard for 1 minute x 2 trials without UE support and CGA for balance.  Pt required multiple rest/water breaks throughout session due to fatigue. Pt was able to perform sit<>stands  throughout session with anywhere from CGA-min A. Returned to room and focused again on blocked practice sit<>stands from recliner with min A fading to CGA. Provided cues to inhale,  then exhale upon exertion and pt was able to perform 3 transfers back to back with CGA using this breathing technique. Concluded session with pt sitting in recliner, needs within reach, and seatbelt alarm on.   Vitals: Seated in recliner: 103/62, HR 66bpm, and SPO2 98% - pt asymptomatic  Standing at recliner: 97/55 - pt slightly symptomatic  Sitting after ambulating to gym: 133/78 - pt asymptomatic   Therapy Documentation Precautions:  Precautions Precautions: Fall, Sternal Precaution Booklet Issued: Yes (comment) Precaution Comments: educated pt on sternal precautions Other Brace: wears L foot brace for h/o foot drop Restrictions Weight Bearing Restrictions: No RUE Weight Bearing: Weight bearing as tolerated (Sternal precautions) LUE Weight Bearing: Weight bearing as tolerated (Sternal Precautions) RLE Weight Bearing: Weight bearing as tolerated LLE Weight Bearing: Weight bearing as tolerated Other Position/Activity Restrictions: sternal precautions  Therapy/Group: Individual Therapy Marlana Salvage Zaunegger Blima Rich PT, DPT 09/17/2022, 6:56 AM

## 2022-09-18 DIAGNOSIS — I480 Paroxysmal atrial fibrillation: Secondary | ICD-10-CM

## 2022-09-18 DIAGNOSIS — K5901 Slow transit constipation: Secondary | ICD-10-CM

## 2022-09-18 DIAGNOSIS — N179 Acute kidney failure, unspecified: Secondary | ICD-10-CM

## 2022-09-18 MED ORDER — SENNOSIDES-DOCUSATE SODIUM 8.6-50 MG PO TABS
2.0000 | ORAL_TABLET | Freq: Every day | ORAL | Status: DC
  Administered 2022-09-18 – 2022-09-19 (×2): 2 via ORAL
  Filled 2022-09-18 (×2): qty 2

## 2022-09-18 MED ORDER — SORBITOL 70 % SOLN
60.0000 mL | Status: AC
  Administered 2022-09-18: 60 mL via ORAL
  Filled 2022-09-18: qty 60

## 2022-09-18 MED ORDER — POLYETHYLENE GLYCOL 3350 17 G PO PACK
17.0000 g | PACK | Freq: Every day | ORAL | Status: DC
  Administered 2022-09-19 – 2022-09-22 (×4): 17 g via ORAL
  Filled 2022-09-18 (×4): qty 1

## 2022-09-18 NOTE — Progress Notes (Signed)
PROGRESS NOTE   Subjective/Complaints: Pt awake in bed. Complains of being constipated. Last bm on 8/21 was hard. He usually empties daily at home. Denied dizziness this morning  ROS: Patient denies fever, rash, sore throat, blurred vision, dizziness, nausea, vomiting, diarrhea, cough, shortness of breath or chest pain, joint or back/neck pain, headache, or mood change.    Objective:   No results found. No results for input(s): "WBC", "HGB", "HCT", "PLT" in the last 72 hours.  No results for input(s): "NA", "K", "CL", "CO2", "GLUCOSE", "BUN", "CREATININE", "CALCIUM" in the last 72 hours.   Intake/Output Summary (Last 24 hours) at 09/18/2022 1018 Last data filed at 09/18/2022 0835 Gross per 24 hour  Intake 952 ml  Output 2200 ml  Net -1248 ml        Physical Exam: Vital Signs Blood pressure 126/69, pulse 68, temperature 98.2 F (36.8 C), temperature source Oral, resp. rate 16, height 6\' 1"  (1.854 m), weight 82.3 kg, SpO2 94%. Constitutional: No distress . Vital signs reviewed. HEENT: NCAT, EOMI, oral membranes moist Neck: supple Cardiovascular: IRR without murmur. No JVD    Respiratory/Chest: CTA Bilaterally without wheezes or rales. Normal effort    GI/Abdomen: BS +, non-tender, non-distended Ext: no clubbing, cyanosis, or edema Psych: a little abrupt but cooperative    Skin: intact Musculoskeletal:        General: No swelling.     Cervical back: Neck supple. No tenderness.     Comments: UE strength 5-/5 B/L- painful with R deltoid- but still 4+/5 Les- HF 5-/5; KE 5-/5; R DF and PF 5-/5 L DF 3+/5 and PF 5-/5   Skin:    General: Skin is warm and dry.     Comments: Sternal incision looks great Incision on R leg x2- near groin and R calf- look good Severe purple bruising posterior proximal Thigh on R- medial scab over the saphenous v region , no induration no eythema no tenderness to palpation  2 IV's LUE- areas  mproving  Neurological:     General: No focal deficit present.     Mental Status: He is alert and oriented to person, place, and time.     Comments: Decreased to light touch from knees to toes B/L- worse on LLE Higher level cognition- slightly delayed responses- even with repetition-  Cannot follow 3 step command, but can 1 step and slowed on 2 step command.      Assessment/Plan: 1. Functional deficits which require 3+ hours per day of interdisciplinary therapy in a comprehensive inpatient rehab setting. Physiatrist is providing close team supervision and 24 hour management of active medical problems listed below. Physiatrist and rehab team continue to assess barriers to discharge/monitor patient progress toward functional and medical goals  Care Tool:  Bathing  Bathing activity did not occur: Refused Body parts bathed by patient: Right arm, Left arm, Chest, Abdomen, Front perineal area, Buttocks, Right upper leg, Left upper leg, Right lower leg, Left lower leg, Face         Bathing assist Assist Level: Supervision/Verbal cueing     Upper Body Dressing/Undressing Upper body dressing   What is the patient wearing?: Pull over shirt  Upper body assist Assist Level: Supervision/Verbal cueing    Lower Body Dressing/Undressing Lower body dressing      What is the patient wearing?: Underwear/pull up, Pants     Lower body assist Assist for lower body dressing: Contact Guard/Touching assist     Toileting Toileting    Toileting assist Assist for toileting: Moderate Assistance - Patient 50 - 74%     Transfers Chair/bed transfer  Transfers assist     Chair/bed transfer assist level: Contact Guard/Touching assist     Locomotion Ambulation   Ambulation assist      Assist level: Minimal Assistance - Patient > 75% Assistive device: Cane-straight Max distance: 238ft   Walk 10 feet activity   Assist     Assist level: Minimal Assistance - Patient >  75% Assistive device: Cane-straight   Walk 50 feet activity   Assist    Assist level: Minimal Assistance - Patient > 75% Assistive device: Cane-straight    Walk 150 feet activity   Assist    Assist level: Minimal Assistance - Patient > 75% Assistive device: Cane-straight    Walk 10 feet on uneven surface  activity   Assist     Assist level: Minimal Assistance - Patient > 75% Assistive device: Walker-rolling   Wheelchair     Assist Is the patient using a wheelchair?: Yes Type of Wheelchair: Manual    Wheelchair assist level: Contact Guard/Touching assist Max wheelchair distance: 50    Wheelchair 50 feet with 2 turns activity    Assist        Assist Level: Contact Guard/Touching assist   Wheelchair 150 feet activity     Assist      Assist Level: Maximal Assistance - Patient 25 - 49%   Blood pressure 126/69, pulse 68, temperature 98.2 F (36.8 C), temperature source Oral, resp. rate 16, height 6\' 1"  (1.854 m), weight 82.3 kg, SpO2 94%.    Medical Problem List and Plan: 1. Functional deficits secondary to debility due to CABG x3 due to STEMI             -patient may  shower-cover incisions             -ELOS/Goals: 7-10 days mod I             Grounds pass ordered  -Continue CIR therapies including PT, OT   2.  Antithrombotics: -DVT/anticoagulation:  Pharmaceutical: Lovenox start renal dose Lovenox             -antiplatelet therapy: Aspirin and Plavix    3. Pain Management: Tylenol as needed   4. Mood/Behavior/Sleep: LCSW to evaluate and provide emotional support             -antipsychotic agents: n/a   5. Neuropsych/cognition: This patient is? capable of making decisions on his own behalf.   6. Skin/Wound Care: Routine skin care checks             -monitor chest and leg incisions, echymosis no evidence of infection R thigh    7. Fluids/Electrolytes/Nutrition: Routine Is and Os and follow-up chemistries             -continue  potassium supplement   8: Hypertension: monitor TID and prn (meds see #10)   9: Hyperlipidemia: continue statin   10: CAD s/p CABG 8/07:             -sternal precautions             -continue amiodarone              -  on aspirin and Plavix             -continue Lopressor             -follow-up with Dr. Leafy Ro   11: BPH: continue Flomax   12: ABLA: follow-up CBC   13: Volume overload: TEE with normal LV function and EF ~60-65% -continue diuresis with Lasix 40 mg daily, asked patient to weight patient today             -monitor volume status/daily weight   -weight reviewed and is 86.2 kg on 8/15, asked nursing to weigh patient 8/16 14: AKI: improving; follow-up BMP             Lasix d/ced by cardiology, discussed with patient  - creatinine is improving.     Latest Ref Rng & Units 09/10/2022    6:20 AM 09/09/2022    5:00 PM 09/07/2022    1:33 AM  BMP  Glucose 70 - 99 mg/dL 161  096  045   BUN 8 - 23 mg/dL 21  23  19    Creatinine 0.61 - 1.24 mg/dL 4.09  8.11  9.14   Sodium 135 - 145 mmol/L 134  135  134   Potassium 3.5 - 5.1 mmol/L 4.1  4.2  3.8   Chloride 98 - 111 mmol/L 100  95  100   CO2 22 - 32 mmol/L 26  27  25    Calcium 8.9 - 10.3 mg/dL 8.6  8.8  8.2      15: Prediabetes: continue carb modified diet, d/c CBG checks and insulin   16: pAF: continue amiodarone 400 mg Bid for two days, then 200 mg BID for seven days then continue on 200 mg daily at discharge, discussed this decrease which is set for 8/24 with patient and therapy, discussed that orthostasis will likely improve once this medication is decreased  -changed lopressor to Toprol XL at night, no further recommendations as per cardiology.              -follow-up with Dr. Dayle Points  -continue magnesium supplement  -HR controlled 17. Higher level cognitive issues complicated by [facility]- pt wants ot wait on SLP order for now.    18. Constipation: will try sorbitol today.  -add scheduled miralax and senna-s  19.  Orthostasis: changed Lopressor to Toprol XL HS, cardiology consulted, lasix d/ced, continue teds and abdominal binder       LOS: 9 days A FACE TO FACE EVALUATION WAS PERFORMED  Ranelle Oyster 09/18/2022, 10:18 AM

## 2022-09-18 NOTE — Progress Notes (Incomplete)
Occupational Therapy Session Note  Patient Details  Name: Collin [name] MRN: [id number] Date of Birth: [birth year]  {CHL IP REHAB OT TIME CALCULATIONS:[id number]}   Short Term Goals: Week 2:  OT Short Term Goal 1 (Week 2): STG = LTG 2/2 ELOS  Skilled Therapeutic Interventions/Progress Updates:  Pt received *** for skilled OT session with focus on ***. Pt agreeable to interventions, demonstrating overall *** mood. Pt reported ***/10 pain, stating "***" in reference to ***. OT offering intermediate rest breaks and positioning suggestions throughout session to address pain/fatigue and maximize participation/safety in session.    Pt remained *** with all immediate needs met at end of session. Pt continues to be appropriate for skilled OT intervention to promote further functional independence.   Therapy Documentation Precautions:  Precautions Precautions: Fall, Sternal Precaution Booklet Issued: Yes (comment) Precaution Comments: educated pt on sternal precautions Other Brace: wears L foot brace for h/o foot drop Restrictions Weight Bearing Restrictions: No RUE Weight Bearing: Weight bearing as tolerated (Sternal precautions) LUE Weight Bearing: Weight bearing as tolerated (Sternal Precautions) RLE Weight Bearing: Weight bearing as tolerated LLE Weight Bearing: Weight bearing as tolerated Other Position/Activity Restrictions: sternal precautions   Therapy/Group: Individual Therapy  Lou Cal, OTR/L, MSOT  09/18/2022, 12:42 PM

## 2022-09-18 NOTE — Progress Notes (Signed)
Physical Therapy Session Note  Patient Details  Name: Collin [name] Sublett MRN: [id number] Date of Birth: [birth year]  Today's Date: 09/18/2022 PT Individual Time: 0730-0827 PT Individual Time Calculation (min): 57 min   Short Term Goals: Week 1:  PT Short Term Goal 1 (Week 1): Patient will complete sit<>stand with CGA. PT Short Term Goal 1 - Progress (Week 1): Met PT Short Term Goal 2 (Week 1): Pt will ambulate >150' with CGA with RW on level surfaces. PT Short Term Goal 2 - Progress (Week 1): Met PT Short Term Goal 3 (Week 1): Patient will recall and verbalize sternal precautions. PT Short Term Goal 3 - Progress (Week 1): Met Week 2:  PT Short Term Goal 1 (Week 2): STG=LTG due to LOS  Skilled Therapeutic Interventions/Progress Updates:   Received pt semi-reclined in bed with RN present at bedside. Pt agreeable to PT treatment and denied any pain during session. Pt did c/o of feeling "foggy" this morning. Session with emphasis on dressing, functional mobility/transfers, generalized strengthening and endurance, NMR, and gait training. Donned thigh high teds with total A for time management purposes and pt transferred semi-reclined<>sitting EOB with HOB elevated and supervision. Donned shoes and L foot up ankle brace with max A for time management purposes. Pt stood from elevated EOB without AD and CGA x 2 trials to assess BP (pt demo posterior LOB).   Pt ambulated 139ft x 2 trials with RW and CGA/close supervision to/from main therapy gym. Pt performed the following activities on Biodex with emphasis on dynamic standing balance/coordination, proprioception, and spatial awareness: -weight shift training on level static for 2 minutes with BUE support fading to 1UE support -weight shift training on level static for 1 minute without UE support  -limits of stability training on level static for 2 minutes with BUE support -catch baseball game on level hard static for 2 minutes with BUE support fading to  1UE support, then no UE support Pt performed multiple sit<>stands throughout session with CGA/min A. Pt continues to demonstrate retropulsion in standing with inability to sustain carry over with cues but demonstrates slight improvements when inhaling, then exhaling to stand. Pt ambulated 250ft with SPC and light min A - pt reported LLE felt "heavy" and pt demonstrating mild scissoring with increased unsteadiness but improved with cues to "stand tall" on LLE. Instructed pt in seated hip abduction 2x20 with blue TB to add to HEP and work on in between therapy sessions. Returned to room and ambulated into bathroom. Pt able to manage clothing standing with CGA and left in care of NT for toileting.   Vitals: Sitting EOB: 111/67 - pt asymptomatic Standing EOB: 89/54 - pt asymptomatic  Sitting after ambulating to gym: 120/64 - slightly symptomatic Sitting after working on Biodex: 94/68 - pt feeling "warm" and "sweaty" and feeling like things weren't 100% clear - symptoms improved with seated rest break  Therapy Documentation Precautions:  Precautions Precautions: Fall, Sternal Precaution Booklet Issued: Yes (comment) Precaution Comments: educated pt on sternal precautions Other Brace: wears L foot brace for h/o foot drop Restrictions Weight Bearing Restrictions: No RUE Weight Bearing: Weight bearing as tolerated (Sternal precautions) LUE Weight Bearing: Weight bearing as tolerated (Sternal Precautions) RLE Weight Bearing: Weight bearing as tolerated LLE Weight Bearing: Weight bearing as tolerated Other Position/Activity Restrictions: sternal precautions  Therapy/Group: Individual Therapy Marlana Salvage Zaunegger Blima Rich PT, DPT 09/18/2022, 6:49 AM

## 2022-09-18 NOTE — Plan of Care (Signed)
  Problem: Consults Goal: RH GENERAL PATIENT EDUCATION Description: See Patient Education module for education specifics. Outcome: Progressing   Problem: RH BLADDER ELIMINATION Goal: RH STG MANAGE BLADDER WITH ASSISTANCE Description: STG Manage Bladder With mod I Assistance Outcome: Progressing   Problem: RH SAFETY Goal: RH STG DECREASED RISK OF FALL WITH ASSISTANCE Description: STG Decreased Risk of Fall With min Assistance. Outcome: Progressing   Problem: RH PAIN MANAGEMENT Goal: RH STG PAIN MANAGED AT OR BELOW PT'S PAIN GOAL Description: Pain will be managed at 3 out of 10 on pain scale with PRN medications min assist  Outcome: Progressing   Problem: Education: Goal: Will demonstrate proper wound care and an understanding of methods to prevent future damage Outcome: Progressing Goal: Knowledge of disease or condition will improve Outcome: Progressing Goal: Knowledge of the prescribed therapeutic regimen will improve Outcome: Progressing Goal: Individualized Educational Video(s) Outcome: Progressing   Problem: Activity: Goal: Risk for activity intolerance will decrease Outcome: Progressing   Problem: Cardiac: Goal: Will achieve and/or maintain hemodynamic stability Outcome: Progressing   Problem: Respiratory: Goal: Respiratory status will improve Outcome: Progressing   Problem: Cardiac: Goal: Ability to achieve and maintain adequate cardiovascular perfusion will improve Outcome: Progressing   Problem: Health Behavior/Discharge Planning: Goal: Ability to safely manage health-related needs after discharge will improve Outcome: Progressing   Problem: Activity: Goal: Ability to return to baseline activity level will improve Outcome: Progressing   Problem: Cardiovascular: Goal: Ability to achieve and maintain adequate cardiovascular perfusion will improve Outcome: Progressing   Problem: Nutritional: Goal: Maintenance of adequate nutrition will improve Outcome:  Progressing Goal: Progress toward achieving an optimal weight will improve Outcome: Progressing   Problem: Tissue Perfusion: Goal: Adequacy of tissue perfusion will improve Outcome: Progressing

## 2022-09-19 MED ORDER — NYSTATIN 100000 UNIT/GM EX POWD
Freq: Two times a day (BID) | CUTANEOUS | Status: DC
  Filled 2022-09-19: qty 15

## 2022-09-19 NOTE — Progress Notes (Signed)
PROGRESS NOTE   Subjective/Complaints: Pt had large BM yesterday. Feels better today. Asked when he would see SW and discuss what he needs at home at discharge  ROS: Patient denies fever, rash, sore throat, blurred vision, dizziness, nausea, vomiting, diarrhea, cough, shortness of breath or chest pain, joint or back/neck pain, headache, or mood change.   Objective:   No results found. No results for input(s): "WBC", "HGB", "HCT", "PLT" in the last 72 hours.  No results for input(s): "NA", "K", "CL", "CO2", "GLUCOSE", "BUN", "CREATININE", "CALCIUM" in the last 72 hours.   Intake/Output Summary (Last 24 hours) at 09/19/2022 0913 Last data filed at 09/19/2022 0733 Gross per 24 hour  Intake 457 ml  Output 410 ml  Net 47 ml        Physical Exam: Vital Signs Blood pressure 124/63, pulse 60, temperature 98.1 F (36.7 C), resp. rate 16, height 6\' 1"  (1.854 m), weight 82.1 kg, SpO2 95%. Constitutional: No distress . Vital signs reviewed. HEENT: NCAT, EOMI, oral membranes moist Neck: supple Cardiovascular: RRR without murmur. No JVD    Respiratory/Chest: CTA Bilaterally without wheezes or rales. Normal effort    GI/Abdomen: BS +, non-tender, non-distended Ext: no clubbing, cyanosis, or edema Psych: pleasant and cooperative  Skin: intact Musculoskeletal:        General: No swelling.     Cervical back: Neck supple. No tenderness.     Comments: UE strength 5-/5 B/L- painful with R deltoid- but still 4+/5 Les- HF 5-/5; KE 5-/5; R DF and PF 5-/5 L DF 3+/5 and PF 5-/5   Skin:    General: Skin is warm and dry.     Comments: Sternal incision CDI Incision on R leg x2- near groin and R calf- look good Severe purple bruising posterior proximal Thigh on R- medial scab over the saphenous v region , no induration no eythema no tenderness to palpation  2 IV's LUE- areas mproving  Neurological:     General: No focal deficit present.      Mental Status: He is alert and oriented to person, place, and time.     Comments: Decreased to light touch from knees to toes B/L- worse on LLE Higher level cognition- slightly delayed responses- even with repetition-  Cannot follow 3 step command, but can 1 step and slowed on 2 step command.      Assessment/Plan: 1. Functional deficits which require 3+ hours per day of interdisciplinary therapy in a comprehensive inpatient rehab setting. Physiatrist is providing close team supervision and 24 hour management of active medical problems listed below. Physiatrist and rehab team continue to assess barriers to discharge/monitor patient progress toward functional and medical goals  Care Tool:  Bathing  Bathing activity did not occur: Refused Body parts bathed by patient: Right arm, Left arm, Chest, Abdomen, Front perineal area, Buttocks, Right upper leg, Left upper leg, Right lower leg, Left lower leg, Face         Bathing assist Assist Level: Supervision/Verbal cueing     Upper Body Dressing/Undressing Upper body dressing   What is the patient wearing?: Pull over shirt    Upper body assist Assist Level: Supervision/Verbal cueing  Lower Body Dressing/Undressing Lower body dressing      What is the patient wearing?: Underwear/pull up, Pants     Lower body assist Assist for lower body dressing: Contact Guard/Touching assist     Toileting Toileting    Toileting assist Assist for toileting: Moderate Assistance - Patient 50 - 74%     Transfers Chair/bed transfer  Transfers assist     Chair/bed transfer assist level: Contact Guard/Touching assist     Locomotion Ambulation   Ambulation assist      Assist level: Minimal Assistance - Patient > 75% Assistive device: Cane-straight Max distance: 23ft   Walk 10 feet activity   Assist     Assist level: Minimal Assistance - Patient > 75% Assistive device: Cane-straight   Walk 50 feet activity   Assist     Assist level: Minimal Assistance - Patient > 75% Assistive device: Cane-straight    Walk 150 feet activity   Assist    Assist level: Minimal Assistance - Patient > 75% Assistive device: Cane-straight    Walk 10 feet on uneven surface  activity   Assist     Assist level: Minimal Assistance - Patient > 75% Assistive device: Walker-rolling   Wheelchair     Assist Is the patient using a wheelchair?: Yes Type of Wheelchair: Manual    Wheelchair assist level: Contact Guard/Touching assist Max wheelchair distance: 50    Wheelchair 50 feet with 2 turns activity    Assist        Assist Level: Contact Guard/Touching assist   Wheelchair 150 feet activity     Assist      Assist Level: Maximal Assistance - Patient 25 - 49%   Blood pressure 124/63, pulse 60, temperature 98.1 F (36.7 C), resp. rate 16, height 6\' 1"  (1.854 m), weight 82.1 kg, SpO2 95%.    Medical Problem List and Plan: 1. Functional deficits secondary to debility due to CABG x3 due to STEMI             -patient may  shower-cover incisions             -ELOS/Goals: 7-10 days mod I             Grounds pass ordered  -Continue CIR therapies including PT, OT    2.  Antithrombotics: -DVT/anticoagulation:  Pharmaceutical: Lovenox start renal dose Lovenox             -antiplatelet therapy: Aspirin and Plavix    3. Pain Management: Tylenol as needed   4. Mood/Behavior/Sleep: LCSW to evaluate and provide emotional support             -antipsychotic agents: n/a   5. Neuropsych/cognition: This patient is? capable of making decisions on his own behalf.   6. Skin/Wound Care: Routine skin care checks             -monitor chest and leg incisions, echymosis no evidence of infection R thigh    7. Fluids/Electrolytes/Nutrition: Routine Is and Os and follow-up chemistries             -continue potassium supplement   8: Hypertension: monitor TID and prn (meds see #10)   9: Hyperlipidemia:  continue statin   10: CAD s/p CABG 8/07:             -sternal precautions             -continue amiodarone              -on aspirin  and Plavix             -continue Lopressor             -follow-up with Dr. Leafy Ro   11: BPH: continue Flomax   12: ABLA: follow-up CBC   13: Volume overload: TEE with normal LV function and EF ~60-65% -continue diuresis with Lasix 40 mg daily, asked patient to weight patient today             -monitor volume status/daily weight   -weights down Filed Weights   09/17/22 0500 09/18/22 0337 09/19/22 0405  Weight: 86.2 kg 82.3 kg 82.1 kg    14: AKI: improving; follow-up BMP             Lasix d/ced by cardiology, discussed with patient  - creatinine is improving.     Latest Ref Rng & Units 09/10/2022    6:20 AM 09/09/2022    5:00 PM 09/07/2022    1:33 AM  BMP  Glucose 70 - 99 mg/dL 782  956  213   BUN 8 - 23 mg/dL 21  23  19    Creatinine 0.61 - 1.24 mg/dL 0.86  5.78  4.69   Sodium 135 - 145 mmol/L 134  135  134   Potassium 3.5 - 5.1 mmol/L 4.1  4.2  3.8   Chloride 98 - 111 mmol/L 100  95  100   CO2 22 - 32 mmol/L 26  27  25    Calcium 8.9 - 10.3 mg/dL 8.6  8.8  8.2      15: Prediabetes: continue carb modified diet, d/c CBG checks and insulin   16: pAF: continue amiodarone 400 mg Bid for two days, then 200 mg BID for seven days then continue on 200 mg daily at discharge, discussed this decrease which is set for 8/24 with patient and therapy, discussed that orthostasis will likely improve once this medication is decreased  -changed lopressor to Toprol XL at night, no further recommendations as per cardiology.              -follow-up with Dr. Dayle Points  -continue magnesium supplement  -HR controlled 17. Higher level cognitive issues complicated by [facility]- pt wants ot wait on SLP order for now.    18. Constipation:   -added scheduled miralax and senna-s  -+ results with sorbitol 8/24  19. Orthostasis: changed Lopressor to Toprol XL HS, cardiology  consulted, lasix d/ced, continue teds and abdominal binder       LOS: 10 days A FACE TO FACE EVALUATION WAS PERFORMED  Ranelle Oyster 09/19/2022, 9:13 AM

## 2022-09-19 NOTE — Progress Notes (Signed)
Progress Note  Patient Name: Collin [name] Date of Encounter: 09/19/2022  Primary Cardiologist:   Tonny Bollman, MD   Subjective   Denies pain or SOB.   Working with T today without issues.   Inpatient Medications    Scheduled Meds:  amiodarone  200 mg Oral Daily   ascorbic acid  250 mg Oral Daily   aspirin EC  81 mg Oral Daily   atorvastatin  80 mg Oral Daily   clopidogrel  75 mg Oral Daily   enoxaparin (LOVENOX) injection  40 mg Subcutaneous Q24H   magnesium gluconate  250 mg Oral QHS   melatonin  3 mg Oral QHS   metoprolol succinate  12.5 mg Oral QHS   nystatin   Topical BID   pantoprazole  40 mg Oral Daily   polyethylene glycol  17 g Oral Daily   potassium chloride  20 mEq Oral Daily   psyllium  1 packet Oral Daily   senna-docusate  2 tablet Oral QHS   tamsulosin  0.4 mg Oral QPC supper   traZODone  25 mg Oral QHS   Continuous Infusions:  PRN Meds: acetaminophen, alum & mag hydroxide-simeth, diphenhydrAMINE, guaiFENesin-dextromethorphan, lidocaine, mouth rinse, prochlorperazine **OR** prochlorperazine **OR** prochlorperazine, sodium phosphate, traMADol   Vital Signs    Vitals:   09/18/22 1534 09/18/22 2002 09/18/22 2107 09/19/22 0405  BP: (!) 144/81 118/64 129/74 124/63  Pulse: 74 82 72 60  Resp: 17 17  16   Temp: 97.6 F (36.4 C) 98.3 F (36.8 C)  98.1 F (36.7 C)  TempSrc: Oral     SpO2: 96% 98%  95%  Weight:    82.1 kg  Height:        Intake/Output Summary (Last 24 hours) at 09/19/2022 1103 Last data filed at 09/19/2022 0733 Gross per 24 hour  Intake 457 ml  Output 410 ml  Net 47 ml   Filed Weights   09/17/22 0500 09/18/22 0337 09/19/22 0405  Weight: 86.2 kg 82.3 kg 82.1 kg    Telemetry    NA - Personally Reviewed  ECG    NA - Personally Reviewed  Physical Exam   GEN: No  acute distress.   Neck: No  JVD Cardiac: RRR, no murmurs, rubs, or gallops.  Respiratory: Clear  to auscultation bilaterally. GI: Soft, nontender,  non-distended, normal bowel sounds  MS:  minimal edema; seen working with PT; he is wearing his compression stockings; No deformity. Neuro:   Nonfocal , diffuse weakness Psych: Oriented and appropriate   Labs    Chemistry No results for input(s): "NA", "K", "CL", "CO2", "GLUCOSE", "BUN", "CREATININE", "CALCIUM", "PROT", "ALBUMIN", "AST", "ALT", "ALKPHOS", "BILITOT", "GFRNONAA", "GFRAA", "ANIONGAP" in the last 168 hours.    Hematology Recent Labs  Lab 09/13/22 0620  WBC 11.5*  RBC 3.63*  HGB 10.3*  HCT 31.3*  MCV 86.2  MCH 28.4  MCHC 32.9  RDW 13.2  PLT 456*    Cardiac EnzymesNo results for input(s): "TROPONINI" in the last 168 hours. No results for input(s): "TROPIPOC" in the last 168 hours.   BNPNo results for input(s): "BNP", "PROBNP" in the last 168 hours.   DDimer No results for input(s): "DDIMER" in the last 168 hours.   Radiology    No results found.  Cardiac Studies   NA  Patient Profile     82 y.o. male with history of remote inferior infarct in 1998, treated with POBA to RCA and medically managed mid LAD disease originally admitted by [facility]  on 09/01/22 in the setting of inferior STEMI. LHC found distal LM and ostial LCX with LAD disease and patient underwent urgent CABG on the same day with Dr. Leafy Ro. He was extubated on 8/8 and IABP removed. Patient started on Amiodarone on 8/8 for afib prophylaxis in post surgical setting, later did have some paroxysmal afib. He was also noted with evidence of volume overload and was placed on Lasix 40mg  daily. Following surgery, patient with notable deconditioning and CIR was arranged with patient being admitted 8/15. By 8/19, patient noted to have orthostatic dizziness/hypotension prompting cardiology consult for management of GDMT.   Assessment & Plan    CAD s/p CABG 09/01/22:  Continue ASA, Plavix and Toprol.    Hypertension:   Continue compression garments   Hyperlipidemia:     Continue current statin.      Paroxysmal atrial fibrillation:  continue 200 mg starting tomorrow. .    Has f/u 09/22/22 Will sign off- please call for questions.   For questions or updates, please contact [facility] Please consult [email or web address] for contact info under Cardiology/STEMI.   Signed, Christell Constant, MD  09/19/2022, 11:03 AM

## 2022-09-20 ENCOUNTER — Ambulatory Visit: Payer: Self-pay

## 2022-09-20 MED ORDER — SENNOSIDES-DOCUSATE SODIUM 8.6-50 MG PO TABS
1.0000 | ORAL_TABLET | Freq: Every day | ORAL | Status: DC
  Administered 2022-09-20 – 2022-09-23 (×4): 1 via ORAL
  Filled 2022-09-20 (×4): qty 1

## 2022-09-20 NOTE — Progress Notes (Addendum)
Patient ID: Collin [name], male   DOB: 06-02-1940, 82 y.o.   MRN: [id number]  met with pt who has some questions but wants his wife here also. She is suppose to be here later this afternoon. Will touch base then.  2:40 PM Met with pt and wife to discuss needs of OP PT and OT and which facility is closer and equipment, which will wait on tb equipment but know he will need a rolling walker and 3 in 1. Wife scheduled to come in Wednesday 1:00-3:00 pm for family training. Will let team know.

## 2022-09-20 NOTE — Progress Notes (Signed)
Occupational Therapy Session Note  Patient Details  Name: Collin [name] MRN: [id number] Date of Birth: [birth year]  Today's Date: 09/20/2022 OT Individual Time: 4259-5638 OT Individual Time Calculation (min): 47 min    Short Term Goals: Week 2:  OT Short Term Goal 1 (Week 2): STG = LTG 2/2 ELOS  Skilled Therapeutic Interventions/Progress Updates:    Pt greeted seated in recliner with spouse present and agreeable to OT treatment session. Pt ambulated to therapy gym w/ RW and CGA. Worked on sit<>stands while holding 3 lb ball with focus on anterior weight shift to get into standing- 3 sets of 10 with rest breaks in between. Pt then wanted to weigh himself. Scale brought over and pt weighed himself requiring a few trials for scale to zero out and get the correct weight. Worked on balance standing on foam block, then incorporated UB there-ex with bicep curls and standing [name] o belly.  3 sets of 10. Pt ambulated back to room and into bathroom with handoff to nursing for time management   Therapy Documentation Precautions:  Precautions Precautions: Fall, Sternal Precaution Booklet Issued: Yes (comment) Precaution Comments: educated pt on sternal precautions Other Brace: wears L foot brace for h/o foot drop Restrictions Weight Bearing Restrictions: No RUE Weight Bearing: Weight bearing as tolerated LUE Weight Bearing: Weight bearing as tolerated RLE Weight Bearing: Weight bearing as tolerated LLE Weight Bearing: Weight bearing as tolerated Other Position/Activity Restrictions: sternal precautions Pain:  Denies pain   Therapy/Group: Individual Therapy  Mal Amabile 09/20/2022, 2:40 PM

## 2022-09-20 NOTE — Progress Notes (Signed)
Occupational Therapy Session Note  Patient Details  Name: Addis Martis Hannan MRN: [id number] Date of Birth: October 25, 1940  Today's Date: 09/20/2022 OT Individual Time: 6789-3810 OT Individual Time Calculation (min): 41 min    Short Term Goals: Week 2:  OT Short Term Goal 1 (Week 2): STG = LTG 2/2 ELOS  Skilled Therapeutic Interventions/Progress Updates:  Skilled OT intervention completed with focus on toileting education, functional endurance. Pt received seated in recliner, agreeable to session. No pain reported.  Pt declined self-care needs, however did have questions about wiping after a BM within his sternal precautions. Stood with 3 rocks, with attempt to pick his bottom up each trial, then able to stand without cues with CGA and without posterior LOB! Ambulated with CGA using RW > w/c. BP assessed below. Water provided. Transported dependently in w/c for time > gym.   Pt indicated he normally stands to wipe, reaching 1 arm behind his back. OT provided education on modifications/adaptations for wiping posteriorly after a BM to stay within his sternal precautions. Utilized toileting tongs to practice folding "wipe" (wash cloth), then practiced at the seated and standing level for simulated wiping via anteriorly through thighs. Pt had mod difficulty with folding the cloth, with mod cues but faded to min. Handouts issued for wand and tong options, but discussed he could wipe anteriorly or reach back but only keeping arm in midline for wiping (however not preferred option 2/2 pt's poor recall with precautions). Will need to educate pt's wife about potential need for assisting with pericare per pt report of "I may just have my wife do it for me."  Pt able to stand during trials with CGA, however posterior LOB, but able to correct with backwards stepping and min A. Transported dependently in w/c to entryway of room, stood with min A for posterior bias, then ambulated with CGA and cues for maintaining  relationship to RW > recliner. Pt remained seated in recliner, with belt alarm on/activated, and with all needs in reach at end of session.  Vitals -BP 101/68 (seated; after ambulation; thigh TEDs already on), HR 75 bpm   Therapy Documentation Precautions:  Precautions Precautions: Fall, Sternal Precaution Booklet Issued: Yes (comment) Precaution Comments: educated pt on sternal precautions Other Brace: wears L foot brace for h/o foot drop Restrictions Weight Bearing Restrictions: No Other Position/Activity Restrictions: sternal precautions    Therapy/Group: Individual Therapy  Melvyn Novas, MS, OTR/L  09/20/2022, 12:18 PM

## 2022-09-20 NOTE — Progress Notes (Signed)
Physical Therapy Session Note  Patient Details  Name: Collin [name] MRN: [id number] Date of Birth: 1940-09-10  Today's Date: 09/20/2022 PT Individual Time: 1305-1330 PT Individual Time Calculation (min): 25 min   Short Term Goals: Week 2:  PT Short Term Goal 1 (Week 2): STG=LTG due to LOS  Skilled Therapeutic Interventions/Progress Updates:     Pt received seated in [facility] and agrees to therapy. No complaint of pain. Sit to stand with minA due to posterior bias and LOB immediately upon standing up. Pt ambualtes to gym, x125', with SPC and CGA/minA, with cues for upright gaze to improve posture and balance, and increasing stride length to decrease risk for falls. Remainder of session focuses on sit to stand transition and improving pt's center of gravity to prevent posterior bias. Pt performs block practicing of sit to stand from very slightly elevated mat table. Pt initially requires minA and has consistent LOBs. With cues to increase knee flexion and move feet closer to mat, pt is able to improve sequencing and anterior wieght shift, consistently performing sit to stand with CGA and decreasing instances of losing balance posteriorly. Pt completes x5 stands in a row to finish session. Pt then ambulates back to room with [facility] and CGA/minA, with same cues. Left seated in recliner with alarm intact and all needs within reach.   Therapy Documentation Precautions:  Precautions Precautions: Fall, Sternal Precaution Booklet Issued: Yes (comment) Precaution Comments: educated pt on sternal precautions Other Brace: wears L foot brace for h/o foot drop Restrictions Weight Bearing Restrictions: No RUE Weight Bearing: Weight bearing as tolerated LUE Weight Bearing: Weight bearing as tolerated RLE Weight Bearing: Weight bearing as tolerated LLE Weight Bearing: Weight bearing as tolerated Other Position/Activity Restrictions: sternal precautions    Therapy/Group: Individual Therapy  Beau Fanny, PT, DPT 09/20/2022, 4:45 PM

## 2022-09-20 NOTE — Progress Notes (Signed)
PROGRESS NOTE   Subjective/Complaints: No new complaints this morning Walking with therapy Therapy notes from yesterday reviewed- orthostasis not mentioned  ROS: Patient denies fever, rash, sore throat, blurred vision, dizziness, nausea, vomiting, diarrhea, cough, shortness of breath or chest pain, joint or back/neck pain, headache, or mood change.   Objective:   No results found. No results for input(s): "WBC", "HGB", "HCT", "PLT" in the last 72 hours.  No results for input(s): "NA", "K", "CL", "CO2", "GLUCOSE", "BUN", "CREATININE", "CALCIUM" in the last 72 hours.   Intake/Output Summary (Last 24 hours) at 09/20/2022 1044 Last data filed at 09/20/2022 0730 Gross per 24 hour  Intake 420 ml  Output 1400 ml  Net -980 ml        Physical Exam: Vital Signs Blood pressure 131/73, pulse 64, temperature 98 F (36.7 C), resp. rate 17, height 6\' 1"  (1.854 m), weight 82.1 kg, SpO2 95%. Gen: no distress, normal appearing HEENT: oral mucosa pink and moist, NCAT Cardio: Reg rate Chest: normal effort, normal rate of breathing Abd: soft, non-distended Ext: no edema Psych: pleasant, normal affect  Musculoskeletal:        General: No swelling.     Cervical back: Neck supple. No tenderness.     Comments: UE strength 5-/5 B/L- painful with R deltoid- but still 4+/5 Les- HF 5-/5; KE 5-/5; R DF and PF 5-/5 L DF 3+/5 and PF 5-/5   Skin:    General: Skin is warm and dry.     Comments: Sternal incision CDI Incision on R leg x2- near groin and R calf- look good Severe purple bruising posterior proximal Thigh on R- medial scab over the saphenous v region , no induration no eythema no tenderness to palpation  2 IV's LUE- areas mproving  Neurological:     General: No focal deficit present.     Mental Status: He is alert and oriented to person, place, and time.     Comments: Decreased to light touch from knees to toes B/L- worse on  LLE Higher level cognition- slightly delayed responses- even with repetition-  Cannot follow 3 step command, but can 1 step and slowed on 2 step command.      Assessment/Plan: 1. Functional deficits which require 3+ hours per day of interdisciplinary therapy in a comprehensive inpatient rehab setting. Physiatrist is providing close team supervision and 24 hour management of active medical problems listed below. Physiatrist and rehab team continue to assess barriers to discharge/monitor patient progress toward functional and medical goals  Care Tool:  Bathing  Bathing activity did not occur: Refused Body parts bathed by patient: Right arm, Left arm, Chest, Abdomen, Front perineal area, Buttocks, Right upper leg, Left upper leg, Right lower leg, Left lower leg, Face         Bathing assist Assist Level: Supervision/Verbal cueing     Upper Body Dressing/Undressing Upper body dressing   What is the patient wearing?: Pull over shirt    Upper body assist Assist Level: Supervision/Verbal cueing    Lower Body Dressing/Undressing Lower body dressing      What is the patient wearing?: Underwear/pull up, Pants     Lower body assist Assist for  lower body dressing: Contact Guard/Touching assist     Toileting Toileting    Toileting assist Assist for toileting: Minimal Assistance - Patient > 75%     Transfers Chair/bed transfer  Transfers assist     Chair/bed transfer assist level: Contact Guard/Touching assist     Locomotion Ambulation   Ambulation assist      Assist level: Minimal Assistance - Patient > 75% Assistive device: Cane-straight Max distance: 298ft   Walk 10 feet activity   Assist     Assist level: Minimal Assistance - Patient > 75% Assistive device: Cane-straight   Walk 50 feet activity   Assist    Assist level: Minimal Assistance - Patient > 75% Assistive device: Cane-straight    Walk 150 feet activity   Assist    Assist level:  Minimal Assistance - Patient > 75% Assistive device: Cane-straight    Walk 10 feet on uneven surface  activity   Assist     Assist level: Minimal Assistance - Patient > 75% Assistive device: Walker-rolling   Wheelchair     Assist Is the patient using a wheelchair?: Yes Type of Wheelchair: Manual    Wheelchair assist level: Contact Guard/Touching assist Max wheelchair distance: 50    Wheelchair 50 feet with 2 turns activity    Assist        Assist Level: Contact Guard/Touching assist   Wheelchair 150 feet activity     Assist      Assist Level: Maximal Assistance - Patient 25 - 49%   Blood pressure 131/73, pulse 64, temperature 98 F (36.7 C), resp. rate 17, height 6\' 1"  (1.854 m), weight 82.1 kg, SpO2 95%.    Medical Problem List and Plan: 1. Functional deficits secondary to debility due to CABG x3 due to STEMI             -patient may  shower-cover incisions             -ELOS/Goals: 7-10 days mod I             Grounds pass ordered  -Continue CIR therapies including PT, OT    2.  Antithrombotics: -DVT/anticoagulation:  Pharmaceutical: Lovenox start renal dose Lovenox             -antiplatelet therapy: Aspirin and Plavix    3. Pain Management: Tylenol as needed   4. Mood/Behavior/Sleep: LCSW to evaluate and provide emotional support             -antipsychotic agents: n/a   5. Neuropsych/cognition: This patient is? capable of making decisions on his own behalf.   6. Skin/Wound Care: Routine skin care checks             -monitor chest and leg incisions, echymosis no evidence of infection R thigh    7. Fluids/Electrolytes/Nutrition: Routine Is and Os and follow-up chemistries             -continue potassium supplement   8: Hypertension: monitor TID and prn (meds see #10)   9: Hyperlipidemia: continue statin   10: CAD s/p CABG 8/07:             -sternal precautions             -continue amiodarone              -on aspirin and Plavix              -continue Lopressor             -follow-up  with Dr. Leafy Ro   11: BPH: continue Flomax   12: ABLA: follow-up CBC   13: Volume overload: TEE with normal LV function and EF ~60-65% -continue diuresis with Lasix 40 mg daily, asked patient to weight patient today             -monitor volume status/daily weight   -weights down Filed Weights   09/17/22 0500 09/18/22 0337 09/19/22 0405  Weight: 86.2 kg 82.3 kg 82.1 kg    14: AKI: improving; follow-up BMP             Lasix d/ced by cardiology, discussed with patient  - creatinine is improving.     Latest Ref Rng & Units 09/10/2022    6:20 AM 09/09/2022    5:00 PM 09/07/2022    1:33 AM  BMP  Glucose 70 - 99 mg/dL 578  469  629   BUN 8 - 23 mg/dL 21  23  19    Creatinine 0.61 - 1.24 mg/dL 5.28  4.13  2.44   Sodium 135 - 145 mmol/L 134  135  134   Potassium 3.5 - 5.1 mmol/L 4.1  4.2  3.8   Chloride 98 - 111 mmol/L 100  95  100   CO2 22 - 32 mmol/L 26  27  25    Calcium 8.9 - 10.3 mg/dL 8.6  8.8  8.2      15: Prediabetes: continue carb modified diet, d/c CBG checks and insulin   16: pAF: continue amiodarone 400 mg Bid for two days, then 200 mg BID for seven days then continue on 200 mg daily at discharge, discussed this decrease which is set for 8/24 with patient and therapy, discussed that orthostasis will likely improve once this medication is decreased  -changed lopressor to Toprol XL at night, no further recommendations as per cardiology.              -follow-up with Dr. Dayle Points  -continue magnesium supplement  -HR controlled  17. Higher level cognitive issues complicated by [facility]- pt wants ot wait on SLP order for now.    18. Constipation:   -added scheduled miralax and senna-s  Chart reviewed and last BM was 8/25, decrease senna-docusate to 1 tab HS  19. Orthostasis: changed Lopressor to Toprol XL HS, cardiology consulted, lasix d/ced, continue teds and abdominal binder       LOS: 11 days A FACE TO FACE EVALUATION  WAS PERFORMED  Drema Pry Jayley Hustead 09/20/2022, 10:44 AM

## 2022-09-20 NOTE — Progress Notes (Signed)
Physical Therapy Session Note  Patient Details  Name: Collin [name] MRN: [id number] Date of Birth: 05-07-1940  Today's Date: 09/20/2022 PT Individual Time: 0800-0857, 1000-1101 PT Individual Time Calculation (min): 57 min, 61 min   Short Term Goals: Week 2:  PT Short Term Goal 1 (Week 2): STG=LTG due to LOS  Skilled Therapeutic Interventions/Progress Updates:      Treatment Session 1  Pt seated in recliner with nurse in room upon arrival. Pt agreeable to therapy. Pt denies any pain.   Pt donned ted hose, L LE AFO and shoes with max A for time conservation.   Sit to stand with RW and mod A, ambulated with RW from room to main gym with supervision/CGA for navigating turns, verbal and tactile cues provided for safety with RW, with emaphsis on standing within frame of RW especially with turns.   Pt performed mass practice of sit<>stand practice from mat table with verbal and tactile cues provided for forward trunk lean for buttock clearance, pt utilizing hands on knees for maintenance of sternal precautions. Pt fluctuates between mod-CGA 2/2 retorpulsion, verbal and tactile cues provided for technique.   Pt ambulated from mat table to standard chair x15 with RW, for mass practice of safety with RW with navigating turns, and sit <>stand. Pt ambulated 2x150 feet with RW and supervision for straight away and CGA for navigating turns as pt crossing over legs, vebral and tactile cues provided for safety. Pt demos improved carryover of walking within frame of RW.    Pt seated in recliner at end of session with seatbelt alarm on and needs within reach.   Treatment Session 2  Pt seated in recliner upon arrival. Pt agreeable to therapy. Pt denies any pain.   Pt performed sit to stand from recliner with +1 min A, with improved carry of technique from this AM. Pt ambulated with RW and supervision/intermittent CGA for stabilization with intermittent crossing legs with navigating turns, verbal cues  provided to not cross legs. Pt weaved in and out of cones with RW and supervision, with therapist providing demonstration and emphasizing keeping both feet in side of RW (not stepping one foot out) and not crossing legs. Pt demos improved awareness and carry over. Pt ambulated with RW, with verbal cues provided for looking ahead versus down, for obstacle negotiation.   Pt performed sit<>stand from mat table with RW and CGA, pt demos improved awareness and restarts/resets when he feels technique is not correct, min verbal cues provided for technique. Pt performed sit<>stand with no AD and min A for LOB 2/2 retropulsion.   Pt performed the following activities to improve static standing balance and hip/ankle strategy:   standing with with no AD with wide BOS on firm surface with eyes closed x1 min standing with no AD with feet shoulder width apart on firm surface with eyes closed  x1 min with CGA/close supervisoin standing with no AD and feet together on firm surface with eyes open x 5-10 seconds with intermittent min A.  Standing with no AD on airex pad with feet shoulder width apart x 1 min with close supervision/CGA   Pt seated in reclienr at end of session with all needs within reach and seatbelt alarm on.   Therapy Documentation Precautions:  Precautions Precautions: Fall, Sternal Precaution Booklet Issued: Yes (comment) Precaution Comments: educated pt on sternal precautions Other Brace: wears L foot brace for h/o foot drop Restrictions Weight Bearing Restrictions: No RUE Weight Bearing: Weight bearing as tolerated LUE  Weight Bearing: Weight bearing as tolerated RLE Weight Bearing: Weight bearing as tolerated LLE Weight Bearing: Weight bearing as tolerated Other Position/Activity Restrictions: sternal precautions  Therapy/Group: Individual Therapy  [facility] Ambrose Finland, [place], DPT  09/20/2022, 7:46 AM

## 2022-09-21 NOTE — Progress Notes (Signed)
Occupational Therapy Session Note  Patient Details  Name: Collin [name] MRN: [id number] Date of Birth: 1940/03/03  Today's Date: 09/21/2022 OT Individual Time: 1020-1100 OT Individual Time Calculation (min): 40 min    Short Term Goals: Week 2:  OT Short Term Goal 1 (Week 2): STG = LTG 2/2 ELOS  Skilled Therapeutic Interventions/Progress Updates:  Skilled OT intervention completed with focus on toileting at standing level for greater functional endurance, ambulatory transfers. Pt received seated in recliner, agreeable to session. No pain reported.  Pt expressed desire to void in standing like normal. BP assessed below prior to and during session however pt remaining asymptomatic during session. Pt initiated 5 "mini stands" in prep for powering up per his "warm up to the legs" to help him stand. CGA to stand with hands on thighs. Ambulated with CGA > toilet in bathroom without LOB including over bathroom threshold. Cues needed to bring RW over top of toilet for balance, but pt able to manage continent void with CGA for balance only. CGA needed to walk backwards with pt demonstrating less stable gait and would benefit from continued practice. Ambulated with CGA > w/c.  Seated at sink, pt completed oral care with set up A. Washed/donned UB with supervision. CGA sit > stand using sink for balance, with cues needed to avoid pulling 2/2 sternal precautions. Able to maintain stance with CGA without UE support for peri area hygiene. Cues needed to sit for LB threading, and sequencing during session. Able to thread with figure 4 position. CGA to stand again, with cues needed to avoid both hands going behind back. Ambulated with RW and CGA > recliner.  Pt remained seated in recliner, with belt alarm on/activated, and with all needs in reach at end of session.  Vitals -BP 124/64 (seated at rest with knee high and abdominal binder already on), HR 65 bpm -BP 116/73 (seated after toileting activity), HR  73 bpm -BP 130/75 (seated after ADL activity without abdominal binder on), HR 70 bpm   Therapy Documentation Precautions:  Precautions Precautions: Fall, Sternal Precaution Booklet Issued: Yes (comment) Precaution Comments: educated pt on sternal precautions Other Brace: wears L foot brace for h/o foot drop Restrictions Weight Bearing Restrictions: No Other Position/Activity Restrictions: sternal precautions    Therapy/Group: Individual Therapy  Melvyn Novas, MS, OTR/L  09/21/2022, 12:08 PM

## 2022-09-21 NOTE — Progress Notes (Signed)
Physical Therapy Session Note  Patient Details  Name: Collin [name] MRN: [id number] Date of Birth: 10-19-1940  Today's Date: 09/21/2022 PT Individual Time: 1300-1342 PT Individual Time Calculation (min): 42 min   Short Term Goals: Week 2:  PT Short Term Goal 1 (Week 2): STG=LTG due to LOS  Skilled Therapeutic Interventions/Progress Updates:      Patient demonstrates increased fall risk as noted by score of   23/56 on Berg Balance Scale.  (<36= high risk for falls, close to 100%; 37-45 significant >80%; 46-51 moderate >50%; 52-55 lower >25%). Education provided on need for RW at all times 2/2 high fall risk. Pt verbalized understanding and agreeable.   Pt ambulated with no AD from room to main gym, main gym to room with L HHA with min-mod A for intermittnet LOB when distracted, verbal and tactile cues provided for looking ahead of pt. Pt ambulated 35 feet with mod A while walking tandem on green line.   Pt perofrmed toe taps on 6 inch box x10 B with L HHA, verbal and tactile cues provided for soft taps.   Pt seated in recliner at end of session with all needs within reach and seatbelt alarm on.    Therapy Documentation Precautions:  Precautions Precautions: Fall, Sternal Precaution Booklet Issued: Yes (comment) Precaution Comments: educated pt on sternal precautions Other Brace: wears L foot brace for h/o foot drop Restrictions Weight Bearing Restrictions: No RUE Weight Bearing: Weight bearing as tolerated LUE Weight Bearing: Weight bearing as tolerated RLE Weight Bearing: Weight bearing as tolerated LLE Weight Bearing: Weight bearing as tolerated Other Position/Activity Restrictions: sternal precautions  Balance: Balance Balance Assessed: Yes Standardized Balance Assessment Standardized Balance Assessment: Berg Balance Test Berg Balance Test Sit to Stand: Needs minimal aid to stand or to stabilize Standing Unsupported: Able to stand 2 minutes with supervision Sitting  with Back Unsupported but Feet Supported on Floor or Stool: Able to sit safely and securely 2 minutes Stand to Sit: Sits safely with minimal use of hands Transfers: Needs one person to assist Standing Unsupported with Eyes Closed: Able to stand 10 seconds with supervision Standing Ubsupported with Feet Together: Needs help to attain position but able to stand for 30 seconds with feet together From Standing, Reach Forward with Outstretched Arm: Reaches forward but needs supervision From Standing Position, Pick up Object from Floor: Unable to try/needs assist to keep balance From Standing Position, Turn to Look Behind Over each Shoulder: Looks behind one side only/other side shows less weight shift Turn 360 Degrees: Needs assistance while turning Standing Unsupported, Alternately Place Feet on Step/Stool: Able to complete >2 steps/needs minimal assist Standing Unsupported, One Foot in Front: Needs help to step but can hold 15 seconds Standing on One Leg: Unable to try or needs assist to prevent fall Total Score: 23 Therapy/Group: Individual Therapy  [facility] [place], [place], DPT  09/21/2022, 1:26 PM

## 2022-09-21 NOTE — Progress Notes (Signed)
PROGRESS NOTE   Subjective/Complaints: No new complaints this morning Feels the dizziness is somewhat improved Did not have a BM yesterday and would like to have one today  ROS: Patient denies fever, rash, sore throat, blurred vision, nausea, vomiting, diarrhea, cough, shortness of breath or chest pain, joint or back/neck pain, headache, or mood change. Dizziness improved  Objective:   No results found. No results for input(s): "WBC", "HGB", "HCT", "PLT" in the last 72 hours.  No results for input(s): "NA", "K", "CL", "CO2", "GLUCOSE", "BUN", "CREATININE", "CALCIUM" in the last 72 hours.   Intake/Output Summary (Last 24 hours) at 09/21/2022 0924 Last data filed at 09/21/2022 0910 Gross per 24 hour  Intake 714 ml  Output 1050 ml  Net -336 ml        Physical Exam: Vital Signs Blood pressure 117/71, pulse 68, temperature 98.1 F (36.7 C), temperature source Oral, resp. rate 16, height 6\' 1"  (1.854 m), weight 82.1 kg, SpO2 93%. Gen: no distress, normal appearing, BMI 23.88 HEENT: oral mucosa pink and moist, NCAT Cardio: Reg rate Chest: normal effort, normal rate of breathing Abd: soft, non-distended Ext: no edema Psych: pleasant, normal affect  Musculoskeletal:        General: No swelling.     Cervical back: Neck supple. No tenderness.     Comments: UE strength 5-/5 B/L- painful with R deltoid- but still 4+/5 Les- HF 5-/5; KE 5-/5; R DF and PF 5-/5 L DF 3+/5 and PF 5-/5   Skin:    General: Skin is warm and dry.     Comments: Sternal incision CDI Incision on R leg x2- near groin and R calf- look good Severe purple bruising posterior proximal Thigh on R- medial scab over the saphenous v region , no induration no eythema no tenderness to palpation  2 IV's LUE- areas mproving  Neurological:     General: No focal deficit present.     Mental Status: He is alert and oriented to person, place, and time.     Comments:  Decreased to light touch from knees to toes B/L- worse on LLE Higher level cognition- slightly delayed responses- even with repetition-  Cannot follow 3 step command, but can 1 step and slowed on 2 step command.      Assessment/Plan: 1. Functional deficits which require 3+ hours per day of interdisciplinary therapy in a comprehensive inpatient rehab setting. Physiatrist is providing close team supervision and 24 hour management of active medical problems listed below. Physiatrist and rehab team continue to assess barriers to discharge/monitor patient progress toward functional and medical goals  Care Tool:  Bathing  Bathing activity did not occur: Refused Body parts bathed by patient: Right arm, Left arm, Chest, Abdomen, Front perineal area, Buttocks, Right upper leg, Left upper leg, Right lower leg, Left lower leg, Face         Bathing assist Assist Level: Supervision/Verbal cueing     Upper Body Dressing/Undressing Upper body dressing   What is the patient wearing?: Pull over shirt    Upper body assist Assist Level: Supervision/Verbal cueing    Lower Body Dressing/Undressing Lower body dressing      What is the patient  wearing?: Underwear/pull up, Pants     Lower body assist Assist for lower body dressing: Contact Guard/Touching assist     Toileting Toileting    Toileting assist Assist for toileting: Minimal Assistance - Patient > 75%     Transfers Chair/bed transfer  Transfers assist     Chair/bed transfer assist level: Contact Guard/Touching assist     Locomotion Ambulation   Ambulation assist      Assist level: Supervision/Verbal cueing Assistive device: Walker-rolling Max distance: 331ft   Walk 10 feet activity   Assist     Assist level: Supervision/Verbal cueing Assistive device: Walker-rolling   Walk 50 feet activity   Assist    Assist level: Supervision/Verbal cueing Assistive device: Walker-rolling    Walk 150 feet  activity   Assist    Assist level: Supervision/Verbal cueing Assistive device: Walker-rolling    Walk 10 feet on uneven surface  activity   Assist     Assist level: Minimal Assistance - Patient > 75% Assistive device: Walker-rolling   Wheelchair     Assist Is the patient using a wheelchair?: Yes Type of Wheelchair: Manual    Wheelchair assist level: Contact Guard/Touching assist Max wheelchair distance: 50    Wheelchair 50 feet with 2 turns activity    Assist        Assist Level: Contact Guard/Touching assist   Wheelchair 150 feet activity     Assist      Assist Level: Maximal Assistance - Patient 25 - 49%   Blood pressure 117/71, pulse 68, temperature 98.1 F (36.7 C), temperature source Oral, resp. rate 16, height 6\' 1"  (1.854 m), weight 82.1 kg, SpO2 93%.    Medical Problem List and Plan: 1. Functional deficits secondary to debility due to CABG x3 due to STEMI             -patient may  shower-cover incisions             -ELOS/Goals: 7-10 days mod I             Grounds pass ordered  -Continue CIR therapies including PT, OT    2.  Antithrombotics: -DVT/anticoagulation:  Pharmaceutical: Lovenox start renal dose Lovenox             -antiplatelet therapy: Aspirin and Plavix    3. Pain Management: Tylenol as needed   4. Mood/Behavior/Sleep: LCSW to evaluate and provide emotional support             -antipsychotic agents: n/a   5. Neuropsych/cognition: This patient is? capable of making decisions on his own behalf.   6. Skin/Wound Care: Routine skin care checks             -monitor chest and leg incisions, echymosis no evidence of infection R thigh    7. Fluids/Electrolytes/Nutrition: Routine Is and Os and follow-up chemistries             -continue potassium supplement   8: Hypertension: monitor TID and prn (meds see #10)   9: Hyperlipidemia: continue statin   10: CAD s/p CABG 8/07:             -sternal precautions              -continue amiodarone              -on aspirin and Plavix             -continue Lopressor             -follow-up  with Dr. Leafy Ro   11: BPH: continue Flomax   12: ABLA: follow-up CBC   13: Volume overload: TEE with normal LV function and EF ~60-65% -Lasix d/ced by cardiology given hypotension             -monitor volume status/daily weight   -weights down Filed Weights   09/17/22 0500 09/18/22 0337 09/19/22 0405  Weight: 86.2 kg 82.3 kg 82.1 kg    14: AKI: f/u BMP ordered but patient refused labs             Lasix d/ced by cardiology, discussed with patient     Latest Ref Rng & Units 09/10/2022    6:20 AM 09/09/2022    5:00 PM 09/07/2022    1:33 AM  BMP  Glucose 70 - 99 mg/dL 657  846  962   BUN 8 - 23 mg/dL 21  23  19    Creatinine 0.61 - 1.24 mg/dL 9.52  8.41  3.24   Sodium 135 - 145 mmol/L 134  135  134   Potassium 3.5 - 5.1 mmol/L 4.1  4.2  3.8   Chloride 98 - 111 mmol/L 100  95  100   CO2 22 - 32 mmol/L 26  27  25    Calcium 8.9 - 10.3 mg/dL 8.6  8.8  8.2      15: Prediabetes:continue carb modified diet, d/c CBG checks and insulin   16: pAF: continue amiodarone 400 mg Bid for two days, then 200 mg BID for seven days then continue on 200 mg daily at discharge, discussed this decrease which is set for 8/24 with patient and therapy, discussed that orthostasis will likely improve once this medication is decreased  -changed lopressor to Toprol XL at night, no further recommendations as per cardiology.              -follow-up with Dr. Dayle Points  -conitnue magnesium supplement  -HR controlled  17. Higher level cognitive issues complicated by [facility]- pt wants ot wait on SLP order for now.    18. Constipation:   -added scheduled miralax and senna-s  Chart reviewed and last BM was 8/25, decrease senna-docusate to 1 tab HS, asked nursing to bring him prune juice  19. Orthostasis: changed Lopressor to Toprol XL HS, cardiology consulted, lasix d/ced, continue teds and abdominal  binder       LOS: 12 days A FACE TO FACE EVALUATION WAS PERFORMED  Drema Pry Veta Dambrosia 09/21/2022, 9:24 AM

## 2022-09-21 NOTE — Progress Notes (Signed)
Patient ID: Collin [name], male   DOB: 03-16-1940, 82 y.o.   MRN: [id number]  Have ordered via [employer] a rolling walker and 3 in 1, OT to decide upon tub equipment. Wife to be here tomorrow for family training.   4:00 pm Wife voiced pt wants to see if can borrow needed equipment from their church will get back with worker regarding if able too.

## 2022-09-21 NOTE — Progress Notes (Signed)
Physical Therapy Session Note  Patient Details  Name: Collin [name] MRN: [id number] Date of Birth: 12/10/1940  Today's Date: 09/21/2022 PT Individual Time: [phone] and [phone] PT Individual Time Calculation (min): 71 min and 42 min  Short Term Goals: Week 1:  PT Short Term Goal 1 (Week 1): Patient will complete sit<>stand with CGA. PT Short Term Goal 1 - Progress (Week 1): Met PT Short Term Goal 2 (Week 1): Pt will ambulate >150' with CGA with RW on level surfaces. PT Short Term Goal 2 - Progress (Week 1): Met PT Short Term Goal 3 (Week 1): Patient will recall and verbalize sternal precautions. PT Short Term Goal 3 - Progress (Week 1): Met Week 2:  PT Short Term Goal 1 (Week 2): STG=LTG due to LOS  Skilled Therapeutic Interventions/Progress Updates:   Treatment Session 1 Received pt semi-reclined in bed, pt agreeable to PT treatment, and denied any pain during session. Session with emphasis on functional mobility/transfers, generalized strengthening and endurance, dynamic standing balance/coordination, and gait training. Donned knee high teds with total A for time management purposes - pt requesting to try knee high teds today vs thigh high. Pt transferred semi-reclined<>sitting EOB with HOB elevated and supervision. Donned shoes and L foot up brace with total A. RN arrived to administer medication. Stood from elevated EOB x 3 trials with CGA (2/3 trials with retropulsion, relying on bed for posterior support).   Pt ambulated 150ft x 2 trials with RW and CGA/close supervision to/from main therapy gym. Worked on blocked practice sit<>stands on Airex 2x8 reps with CGA for balance - emphasis on good quality movements without retropulsion. Pt required 10 attempts to achieve 8 successful stands on trials 1 and 2 and required seated rest/water break in between. Ambulated to [facility] and performed standing BLE strengthening on Kinetron at 30 cm/sec for 1 minute x 2 trials with emphasis on  glute/quad strength (caution to avoid pushing through UEs and use just for balance) - limited by BP symptoms therefore transitioned to siting for additional 1 minute x 2 trials and symptoms improved. Required x 2 attempts and CGA to stand from EOM and ambulated 355ft with RW and close supervision - pt with good recall of remaining within RW BOS. Concluded session with pt sitting in recliner, needs within reach, and seatbelt alarm on. Pt reporting 7/10 fatigue at end of session.   Vitals: BP sitting EOB: 111/65 - pt asymptomatic  BP standing EOB: 85/57 - pt asymptomatic BP standing EOB with abdominal binder donned: 86/54 - pt asymptomatic   BP after ambulating to gym: 112/69 - pt slightly symptomatic BP after sit<>stands: 113/68 - pt asymptomatic  BP after standing Kinetron: 101/64 - pt feeling "foggy" BP after seated Kinetron: 119/68 - pt asymptomatic  BP seated after ambulating: 133/77 - pt asymptomatic   Treatment Session 2 Received pt sitting in recliner, pt agreeable to PT treatment, and denied any pain during session. Session with emphasis on functional mobility/transfers, toileting, generalized strengthening and endurance, dynamic standing balance/coordination, and gait training. Pt required multiple attempts and CGA to stand from recliner due to continued retropulsion. Pt ambulated in/out of bathroom with RW and CGA and pt stood to void in toilet with supervision. Pt then ambulated 132ft x 2 trials with RW and close supervision to/from dayroom.   Pt participated in for a total of 756ft with RW and CGA/close supervision. Pt did ot require any standing or sitting rest breaks during test, but needed extended seated rest/water break afterwards.  Transitioned to standing squats holding soccer ball 4x10 with CGA for balance with emphasis on quad strength. Pt with 3 posterior LOB onto mat and with a few instances of retropulsion. Returned to room and challenged pt with 5 final sit<>stands with  emphasis on good form - pt required numerous attempts and only able to complete 2, reporting fatigue. Concluded session with pt sitting in recliner, needs within reach, and seatbelt alarm on. Wife present at bedside.   Vitals with knee high teds only: Sitting in recliner: 106/73 - pt asymptomatic  Standing at recliner: 83/59 - pt asymptomatic  Sitting after ambulating to gym: 130/71 - pt asymptomatic  Sitting after squats: 140/83 on trial 1 and 138/79 on trial 2 - pt stated "I feel better than when I started"  Therapy Documentation Precautions:  Precautions Precautions: Fall, Sternal Precaution Booklet Issued: Yes (comment) Precaution Comments: educated pt on sternal precautions Other Brace: wears L foot brace for h/o foot drop Restrictions Weight Bearing Restrictions: No RUE Weight Bearing: Weight bearing as tolerated LUE Weight Bearing: Weight bearing as tolerated RLE Weight Bearing: Weight bearing as tolerated LLE Weight Bearing: Weight bearing as tolerated Other Position/Activity Restrictions: sternal precautions  Therapy/Group: Individual Therapy Marlana Salvage Zaunegger Blima Rich PT, DPT 09/21/2022, 6:49 AM

## 2022-09-21 NOTE — Plan of Care (Signed)
  Problem: RH Wheelchair Mobility Goal: LTG Patient will propel w/c in controlled environment (PT) Description: LTG: Patient will propel wheelchair in controlled environment, # of feet with assist (PT) Outcome: Not Applicable Flowsheets (Taken 09/21/2022 0659) LTG: Pt will propel w/c in controlled environ  assist needed:: (D/C) -- Note: D/C

## 2022-09-22 ENCOUNTER — Ambulatory Visit: Payer: PPO | Admitting: Cardiology

## 2022-09-22 MED ORDER — POLYETHYLENE GLYCOL 3350 17 G PO PACK
17.0000 g | PACK | Freq: Two times a day (BID) | ORAL | Status: DC
  Administered 2022-09-22 – 2022-09-23 (×2): 17 g via ORAL
  Filled 2022-09-22 (×4): qty 1

## 2022-09-22 MED ORDER — MAGNESIUM CITRATE PO SOLN: 1.0000 | Freq: Once | ORAL | Status: DC

## 2022-09-22 NOTE — Progress Notes (Signed)
Occupational Therapy Session Note  Patient Details  Name: Collin [name] MRN: [id number] Date of Birth: 11/01/1940  Today's Date: 09/22/2022 OT Individual Time: 1005-1030 & 1405-1500 OT Individual Time Calculation (min): 25 min & 55 min   Short Term Goals: Week 2:  OT Short Term Goal 1 (Week 2): STG = LTG 2/2 ELOS  Skilled Therapeutic Interventions/Progress Updates:  Session 1 Skilled OT intervention completed with focus on ambulatory endurance. Pt received seated in w/c, agreeable to session. No pain reported.  BP assessed below however pt denying dizziness for entire session. Sit > stand with CGA on first try with hands on thighs and great sternal precaution adherence! Ambulated with CGA using RW <> gym for endurance. Cues needed for direction as well as on turns to space feet out, however pt continues to present with decreased heel strike despite improvement in gait/mobility.  Seated EOM, using 3 lb dumbbell, pt completed bicep curls and front raise to 90 degrees x15 each for BUE strengthening. Cues needed for sternal precaution adherence to avoid putting both hands behind back when rubbing his back.   Back in room, pt remained seated in recliner, with belt alarm on/activated, and with all needs in reach at end of session.  Vitals BP 111/66 (seated knee TEDs on), HR 66 bpm  Session 2 Skilled OT intervention completed with focus on family education with pt's wife present. Pt received seated in recliner, agreeable to session. No pain reported.  BP assessed below. Completed all sit > stands and ambulatory transfers with CGA/supervision using RW during session with intermittent cues for body positioning to RW especially navigating turns to prevent entanglement of BLE as pt with narrow BOS and mildly ataxic gait pattern.  OT provided education on the following in prep for DC: -Recommended 24/7 supervision for all mobility (ambulatory with RW) and ADLs due to poor recall and frequency  of LOB -Discussed BP management/assessing prior to functional tasks, with review on use of TEDs, binders or hydration to assist -Discussed LH sponge, reacher, and toileting wand options for home with handouts provided on each -sternal precautions and pt's tendencies to break them, when dual tasking with cues needed intermittently for incision integrity -shower set up, with recommendation of shower chair with back and arms placed up against one wall of the shower for increased stability/energy conservation during bathing at shower level; discussed purchasing privately 2/2 lack of insurance coverage for bathroom DME -Energy conservation strategies- lukewarm water, proper ventilation, avoiding standing for bathing, planning showers when rested and when able to rest afterwards -Reviewed use of BSC at bedside to reduce fall risk at night and for BP management  OT assisted pt with demo of ambulating in hallway > ADL bathroom. Pt practiced standing from low recliner, with only min A needed with hands on thighs but reports not having anything that low in home. Pt's wife assisted pt with supervision/CGA ambulatory transfer with RW > simulated shower set up, then pt stepping over sideways to shower chair > stood with CGA from seat and ambulated to EOB. Discussed avoidance of grab bars to assist himself up but clearance to use only to steady himself.  Pt practiced lying down in standard bed (about same height as his at home) with overall supervision > supine but min A to sit up back at EOB. Cues needed for side lying with poor recall of technique, and cues to avoid use of bed rail as pt without this at home. Reinforced side lying as pushing even with  bed rail is not permitted. Wife assisted pt with ambulation back to room with cues to stay within touching distance to him when mobilizing.  Pt remained seated in recliner, with belt alarm on/activated, and with all needs in reach at end of session.  Vitals BP  101/61 (seated at rest with knee TEDs) BP 139/83 (seated after activity)  Therapy Documentation Precautions:  Precautions Precautions: Fall, Sternal Precaution Booklet Issued: Yes (comment) Precaution Comments: educated pt on sternal precautions Other Brace: wears L foot brace for h/o foot drop Restrictions Weight Bearing Restrictions: No Other Position/Activity Restrictions: sternal precautions    Therapy/Group: Individual Therapy  Melvyn Novas, MS, OTR/L  09/22/2022, 3:58 PM

## 2022-09-22 NOTE — Progress Notes (Signed)
Physical Therapy Session Note  Patient Details  Name: Collin [name] MRN: [id number] Date of Birth: 08/01/1940  Today's Date: 09/22/2022 PT Individual Time: [phone] and [phone] PT Individual Time Calculation (min): 41 min and 57 min  Short Term Goals: Week 1:  PT Short Term Goal 1 (Week 1): Patient will complete sit<>stand with CGA. PT Short Term Goal 1 - Progress (Week 1): Met PT Short Term Goal 2 (Week 1): Pt will ambulate >150' with CGA with RW on level surfaces. PT Short Term Goal 2 - Progress (Week 1): Met PT Short Term Goal 3 (Week 1): Patient will recall and verbalize sternal precautions. PT Short Term Goal 3 - Progress (Week 1): Met Week 2:  PT Short Term Goal 1 (Week 2): STG=LTG due to LOS  Skilled Therapeutic Interventions/Progress Updates:   Treatment Session 1 Received pt sitting in recliner, pt agreeable to PT treatment, and denied any pain during session. Session with emphasis on functional mobility/transfers, generalized strengthening and endurance, and gait training. Donned knee high teds with total A and shoes and L foot up ankle brace with max A. Pt stood from recliner with RW and CGA - demonstrating mild posterior LOB but pt able to correct.   Pt ambulated 151ft with RW and supervision to dayroom. Pt performed x10 blocked practice sit<>stands without UE support and close supervision to "warm up"; pt successful on 10/10 stands. Pt able to perform remainder of sit<>stands with CGA/close supervision throughout session. Transitioned to standing alternating toe taps to 6in step 3x20 reps with min/light mod A for balance - pt with multiple posterior LOB. Pt ambulated 357ft with RW and supervision with no LOB but relied heavily on visual input looking at feet due to impaired proprioception. Pt ambulated 185ft with RW and supervision back to room and concluded session with pt sitting in recliner, needs within reach, and seatbelt alarm on. NT present at beside.   Vitals: BP  sitting in recliner without teds on: 102/60 - pt asymptomatic  BP standing EOB: 72/58 - pt asymptomatic BP sitting after ambulating to gym: 103/66 - pt asymptomatic  BP sitting after ambulating 313ft: 128/53 - pt asymptomatic   Treatment Session 2 Received pt sitting in recliner with wife present for family education training. Pt agreeable to PT treatment and denied any pain during session. Session with emphasis on discharge planning, functional mobility/transfers, generalized strengthening and endurance, simulated car transfers, stair navigation, and gait training.   Pt stood x 2 trials from recliner without AD and close supervision - noted posterior LOB requiring external support to correct balance. Pt ambulated 161ft with RW and supervision to main therapy gym. Pt reports having 3-4 STE with 2 handrails but railings are too far apart to reach both - suggested using lateral stepping technique. Pt navigated 8 6in steps with R handrail and close supervision with 1 instance requiring min A to correct due to pt not placing RLE completley on step. Instructed pts wife to bring RW to top/bottom of steps for pt and importance of providing close supervision during stair navigation.  Worked on blocked practice sit<>stands from low sitting chair x 3 reps with large episodes of retropulsion. Discussed building up chairs at home with cushions/pillows to raise seat height and placing 1UE support on RW for balance only and emphasized avoiding pushing/pulling - pt able to stand consistently with supervision without retropulsion throughout remainder of session using this technique. Ambulated 169ft with RW and supervision to ortho gym and performed simulated car transfer with  RW and supervision. Pt then ambulated 14ft on uneven surfaces (ramp) with RW and close supervision. Ambulated >341ft with RW and supervision back to room. Concluded session with pt sitting in recliner with all needs within reach awaiting upcoming OT  session. Wife and CSW present at bedside.   Educated pt/pt's wife on the following: -Need for close supervision/hands on assist upon transferring from sit<>stand and when turning/backing up -body mechanics/positioning with stair navigation -self-recognition of symptom onset of dizziness/lightheadedness and modifying activities based on symptoms -typical pattern of pt starting off day with lower BP, and progressively getting higher with activity/mobility -importance of using RW vs SPC or no AD at home for safety -importance of wearing compression socks and/or abdominal binder based off symptoms   Vitals: BP sitting in recliner: 106/65 - pt asymptomatic  BP standing at recliner: 98/57 - pt asymptomatic  BP after stair navigation: 128/74 - pt asymptomatic  BP after car transfer: 124/76 - pt asymptomatic  BP sitting in recliner at end of session: 124/62 - pt asymptomatic   Therapy Documentation Precautions:  Precautions Precautions: Fall, Sternal Precaution Booklet Issued: Yes (comment) Precaution Comments: educated pt on sternal precautions Other Brace: wears L foot brace for h/o foot drop Restrictions Weight Bearing Restrictions: No RUE Weight Bearing: Weight bearing as tolerated LUE Weight Bearing: Weight bearing as tolerated RLE Weight Bearing: Weight bearing as tolerated LLE Weight Bearing: Weight bearing as tolerated Other Position/Activity Restrictions: sternal precautions  Therapy/Group: Individual Therapy Marlana Salvage Zaunegger Blima Rich PT, DPT 09/22/2022, 6:49 AM

## 2022-09-22 NOTE — Progress Notes (Signed)
PROGRESS NOTE   Subjective/Complaints: No new complaints this morning Feels that dizziness has improved Was unable to have a BM yesterday with prune juice  ROS: Patient denies fever, rash, sore throat, blurred vision, nausea, vomiting, diarrhea, cough, shortness of breath or chest pain, joint or back/neck pain, headache, or mood change. Dizziness improved, +constipation  Objective:   No results found. No results for input(s): "WBC", "HGB", "HCT", "PLT" in the last 72 hours.  No results for input(s): "NA", "K", "CL", "CO2", "GLUCOSE", "BUN", "CREATININE", "CALCIUM" in the last 72 hours.   Intake/Output Summary (Last 24 hours) at 09/22/2022 1048 Last data filed at 09/22/2022 0734 Gross per 24 hour  Intake 473 ml  Output 1325 ml  Net -852 ml        Physical Exam: Vital Signs Blood pressure (!) 144/83, pulse 73, temperature 98 F (36.7 C), temperature source Oral, resp. rate 16, height 6\' 1"  (1.854 m), weight 86.1 kg, SpO2 97%. Gen: no distress, normal appearing, BMI 23.88 Gen: no distress, normal appearing HEENT: oral mucosa pink and moist, NCAT Cardio: Reg rate Chest: normal effort, normal rate of breathing Abd: soft, non-distended Ext: no edema Psych: pleasant, normal affect Skin: intact   Musculoskeletal:        General: No swelling.     Cervical back: Neck supple. No tenderness.     Comments: UE strength 5-/5 B/L- painful with R deltoid- but still 4+/5 Les- HF 5-/5; KE 5-/5; R DF and PF 5-/5 L DF 3+/5 and PF 5-/5   Skin:    General: Skin is warm and dry.     Comments: Sternal incision CDI Incision on R leg x2- near groin and R calf- look good Severe purple bruising posterior proximal Thigh on R- medial scab over the saphenous v region , no induration no eythema no tenderness to palpation  2 IV's LUE- areas mproving  Neurological:     General: No focal deficit present.     Mental Status: He is alert and  oriented to person, place, and time.     Comments: Decreased to light touch from knees to toes B/L- worse on LLE Higher level cognition- slightly delayed responses- even with repetition-  Cannot follow 3 step command, but can 1 step and slowed on 2 step command.      Assessment/Plan: 1. Functional deficits which require 3+ hours per day of interdisciplinary therapy in a comprehensive inpatient rehab setting. Physiatrist is providing close team supervision and 24 hour management of active medical problems listed below. Physiatrist and rehab team continue to assess barriers to discharge/monitor patient progress toward functional and medical goals  Care Tool:  Bathing  Bathing activity did not occur: Refused Body parts bathed by patient: Right arm, Left arm, Chest, Abdomen, Front perineal area, Buttocks, Right upper leg, Left upper leg, Right lower leg, Left lower leg, Face         Bathing assist Assist Level: Supervision/Verbal cueing     Upper Body Dressing/Undressing Upper body dressing   What is the patient wearing?: Pull over shirt    Upper body assist Assist Level: Supervision/Verbal cueing    Lower Body Dressing/Undressing Lower body dressing  What is the patient wearing?: Underwear/pull up, Pants     Lower body assist Assist for lower body dressing: Contact Guard/Touching assist     Toileting Toileting    Toileting assist Assist for toileting: Contact Guard/Touching assist     Transfers Chair/bed transfer  Transfers assist     Chair/bed transfer assist level: Supervision/Verbal cueing     Locomotion Ambulation   Ambulation assist      Assist level: Supervision/Verbal cueing Assistive device: Walker-rolling Max distance: 765   Walk 10 feet activity   Assist     Assist level: Supervision/Verbal cueing Assistive device: Walker-rolling   Walk 50 feet activity   Assist    Assist level: Supervision/Verbal cueing Assistive device:  Walker-rolling    Walk 150 feet activity   Assist    Assist level: Supervision/Verbal cueing Assistive device: Walker-rolling    Walk 10 feet on uneven surface  activity   Assist     Assist level: Minimal Assistance - Patient > 75% Assistive device: Walker-rolling   Wheelchair     Assist Is the patient using a wheelchair?: Yes Type of Wheelchair: Manual    Wheelchair assist level: Contact Guard/Touching assist Max wheelchair distance: 50    Wheelchair 50 feet with 2 turns activity    Assist        Assist Level: Contact Guard/Touching assist   Wheelchair 150 feet activity     Assist      Assist Level: Maximal Assistance - Patient 25 - 49%   Blood pressure (!) 144/83, pulse 73, temperature 98 F (36.7 C), temperature source Oral, resp. rate 16, height 6\' 1"  (1.854 m), weight 86.1 kg, SpO2 97%.    Medical Problem List and Plan: 1. Functional deficits secondary to debility due to CABG x3 due to STEMI             -patient may  shower-cover incisions             -ELOS/Goals: 7-10 days mod I             Grounds pass ordered  -Continue CIR therapies including PT, OT    2.  Antithrombotics: -DVT/anticoagulation:  Pharmaceutical: Lovenox start renal dose Lovenox             -antiplatelet therapy: Aspirin and Plavix    3. Pain Management: Tylenol as needed   4. Mood/Behavior/Sleep: LCSW to evaluate and provide emotional support             -antipsychotic agents: n/a   5. Neuropsych/cognition: This patient is? capable of making decisions on his own behalf.   6. Skin/Wound Care: Routine skin care checks             -monitor chest and leg incisions, echymosis no evidence of infection R thigh    7. Fluids/Electrolytes/Nutrition: Routine Is and Os and follow-up chemistries             -continue potassium supplement   8: Hypertension: monitor TID and prn (meds see #10)   9: Hyperlipidemia: continue statin   10: CAD s/p CABG 8/07:              -sternal precautions             -continue amiodarone              -on aspirin and Plavix             -continue Lopressor             -  follow-up with Dr. Leafy Ro   11: BPH: continue Flomax   12: ABLA: follow-up CBC   13: Volume overload: TEE with normal LV function and EF ~60-65% -Lasix d/ced by cardiology given hypotension             -monitor volume status/daily weight   -weights down Filed Weights   09/18/22 0337 09/19/22 0405 09/22/22 0644  Weight: 82.3 kg 82.1 kg 86.1 kg    14: AKI: f/u BMP ordered but patient refused labs             Lasix d/ced by cardiology, discussed with patient     Latest Ref Rng & Units 09/10/2022    6:20 AM 09/09/2022    5:00 PM 09/07/2022    1:33 AM  BMP  Glucose 70 - 99 mg/dL 161  096  045   BUN 8 - 23 mg/dL 21  23  19    Creatinine 0.61 - 1.24 mg/dL 4.09  8.11  9.14   Sodium 135 - 145 mmol/L 134  135  134   Potassium 3.5 - 5.1 mmol/L 4.1  4.2  3.8   Chloride 98 - 111 mmol/L 100  95  100   CO2 22 - 32 mmol/L 26  27  25    Calcium 8.9 - 10.3 mg/dL 8.6  8.8  8.2      15: Prediabetes continue carb modified diet, d/c CBG checks and insulin   16: pAF: continue amiodarone 200 mg BID for seven days then continue on 200 mg daily at discharge, discussed this decrease which is set for 8/24 with patient and therapy, discussed that orthostasis will likely improve once this medication is decreased  -changed lopressor to Toprol XL at night, no further recommendations as per cardiology.              -follow-up with Dr. Dayle Points  -conitnue magnesium supplement  -HR controlled  17. Higher level cognitive issues complicated by [facility]- pt wants ot wait on SLP order for now.    18. Constipation:   -added scheduled miralax and senna-s  Chart reviewed and last BM was 8/25, decrease senna-docusate to 1 tab HS, asked nursing to bring him prune juice, increased miralax to BID  19. Orthostasis: changed Lopressor to Toprol XL HS, cardiology consulted, lasix d/ced,  continue teds and abdominal binder       LOS: 13 days A FACE TO FACE EVALUATION WAS PERFORMED  Drema Pry Tamesha Ellerbrock 09/22/2022, 10:48 AM

## 2022-09-22 NOTE — Patient Care Conference (Signed)
Inpatient RehabilitationTeam Conference and Plan of Care Update Date: 09/22/2022   Time: 11:34 AM   Patient Name: Collin [name]      Medical Record Number: [id number]  Date of Birth: 02/03/1940 Sex: Male         Room/Bed: 4W17C/4W17C-01 Payor Info: Payor: [employer] / Plan: [employer] PPO / Product Type: *No Product type* /    Admit Date/Time:  09/09/2022  3:40 PM  Primary Diagnosis:  Debility  Hospital Problems: Principal Problem:   Debility Active Problems:   STEMI (ST elevation myocardial infarction) (HCC)   S/P CABG x 3   Orthostatic hypotension    Expected Discharge Date: Expected Discharge Date: 09/24/22  Team Members Present: Physician leading conference: Dr. Sula Soda Social Worker Present: Dossie Der, LCSW Nurse Present: Chana Bode, Eden Lathe, RN PT Present: Raechel Chute, PT OT Present: Candee Furbish, OT     Current Status/Progress Goal Weekly Team Focus  Bowel/Bladder   Pt is continent of b/b. LBM: 08/25  *** Remain continent of b/b.   Assist w/ toileting needs q 2-4 hours and as needed.    Swallow/Nutrition/ Hydration      ***         ADL's   Set up A UB, CGA LB including TEDs if given extra time, min A toileting  *** Supervision/CGA   Barriers- carryover with suspected mild cognitive impairments limiting ADL progression at independent level, endurance and powering up into standing without LOB    Mobility   bed mobility supervision, transfers with RW CGA/min A due to retropulsion, gait >19ft with RW and CGA/supervision, 8 steps 2 rails min A  *** supervision, mod I WC mobility  Barriers: BP management, dynamic standing balance/coordination, endurance, retropulsion when standing    Communication      ***          Safety/Cognition/ Behavioral Observations     ***          Pain   Pt denies pain.  *** Remain pain free.   Assess pain q shift & PRN.    Skin   Midline incision to chest - OTA;  Redness to peri area & groin; Bruising to chest & R foot.  *** Pt's incisional area will heal in a timely manner w/o complications.  Assess skin q shift & PRN.      Discharge Planning:  Wife will be here at 1:00 fro family training and have set up OP rehab via [address] have ordered via [employer]-rolling walker and 3 in 1 and wiull await OT recommendations for tub equipment. Wife aware will need 24/7 supervision at discharge   Team Discussion: *** Patient on target to meet rehab goals: {IP REHAB YES/NO WITH YNWGNFAOZ:30865}  *See Care Plan and progress notes for long and short-term goals.   Revisions to Treatment Plan:  ***  Teaching Needs: ***  Current Barriers to Discharge: {BARRIERS TO HQIONGEXB:28413}  Possible Resolutions to Barriers: ***     Medical Summary Current Status: constipation, sternal incision, hypertension, overweight, prediabetes, orthostatic hypotension    Barriers to Discharge Comments: constipation, sternal incision, hypertension, overweight, prediabetes, orthostatic hypotension Possible Resolutions to [employer] Focus: amiodarone weaned to 200mg  BID, d/c lasix, continue sternal precautions, continue carb modified diet, continue teds and abdominal binder   Continued Need for Acute Rehabilitation Level of Care: The patient requires daily medical management by a physician with specialized training in physical medicine and rehabilitation for the following reasons: Direction of a multidisciplinary physical rehabilitation program to  maximize functional independence : Yes Medical management of patient stability for increased activity during participation in an intensive rehabilitation regime.: Yes Analysis of laboratory values and/or radiology reports with any subsequent need for medication adjustment and/or medical intervention. : Yes   I attest that I was present, lead the team conference, and concur with the assessment and plan of the team.   Jearld Adjutant 09/22/2022, 12:03 PM

## 2022-09-22 NOTE — Progress Notes (Signed)
Occupational Therapy Discharge Summary  Patient Details  Name: Collin [name] MRN: [id number] Date of Birth: 1941/01/03  Date of Discharge from OT service:September 23, 2022  Patient has met 6 of 6 long term goals due to improved activity tolerance, improved balance, and improved coordination.  Patient to discharge at overall Supervision level.  Patient's care partner is independent to provide the necessary physical and cognitive assistance at discharge. Pt's wife attended 2 education sessions regarding ADL and functional mobility recommendations and verbalized her readiness to assist pt at his CLOF.  All goals met  Recommendation:  Patient will benefit from ongoing skilled OT services in outpatient setting to continue to advance functional skills in the area of BADL and iADL.  Equipment: BSC  Reasons for discharge: treatment goals met and discharge from hospital  Patient/family agrees with progress made and goals achieved: Yes  OT Discharge Precautions/Restrictions  Precautions Precautions: Fall;Sternal Other Brace: wears L foot brace for h/o foot drop Restrictions Weight Bearing Restrictions: No ADL ADL Eating: Independent Where Assessed-Eating: Chair Grooming: Independent Where Assessed-Grooming: Wheelchair Upper Body Bathing: Supervision/safety Where Assessed-Upper Body Bathing: Shower Lower Body Bathing: Supervision/safety Where Assessed-Lower Body Bathing: Shower Upper Body Dressing: Setup Where Assessed-Upper Body Dressing: Wheelchair Lower Body Dressing: Supervision/safety Where Assessed-Lower Body Dressing: Sitting at sink, Standing at sink Toileting: Supervision/safety Where Assessed-Toileting: Teacher, adult education: Close supervision Toilet Transfer Method: Proofreader: Other (comment), Raised toilet seat (RW) Tub/Shower Transfer: Not assessed Film/video editor: Close supervision Film/video editor Method:  Designer, industrial/product: Sales promotion account executive Baseline Vision/History: 0 No visual deficits Patient Visual Report: No change from baseline Vision Assessment?: No apparent visual deficits;Wears glasses for reading Perception  Perception: Within Functional Limits Praxis Praxis: [facility] [place] Cognition  Brief Interview for Mental Status (BIMS) Repetition of Three Words (First Attempt): 3 Temporal Orientation: Year: Correct Temporal Orientation: Month: Accurate within 5 days Temporal Orientation: Day: Correct Recall: "Sock": Yes, no cue required Recall: "Blue": Yes, no cue required Recall: "Bed": Yes, no cue required BIMS Summary Score: 15 Sensation Sensation Light Touch Impaired Details: Impaired RLE;Impaired LLE Hot/Cold: Not tested Proprioception: Impaired by gross assessment Stereognosis: Not tested Additional Comments: decreased sensation LLE>RLE and absent sensation along bilateral great toes Coordination Gross Motor Movements are Fluid and Coordinated: No Fine Motor Movements are Fluid and Coordinated: No Coordination and Movement Description: Mild LLE foot drop with foot up brace, global deconditioning, decreased balance strategies Finger Nose Finger Test: dysmetria LUE>RUE Heel Shin Test: slow and uncoordinated bilaterally Motor  Motor Motor: Other (comment) Motor - Skilled Clinical Observations: Mild LLE foot drop with foot up brace, global deconditioning, decreased balance strategies Mobility  Bed Mobility Bed Mobility: Rolling Right;Rolling Left;Sit to Supine;Supine to Sit Rolling Right: Independent with assistive device Rolling Left: Independent with assistive device Right Sidelying to Sit: Minimal Assistance - Patient > 75% Supine to Sit: Independent with assistive device Sit to Supine: Independent with assistive device Transfers Sit to Stand: Supervision/Verbal cueing Stand to Sit: Supervision/Verbal cueing  Trunk/Postural Assessment  Cervical  Assessment Cervical Assessment: Within Functional Limits Thoracic Assessment Thoracic Assessment: Within Functional Limits Lumbar Assessment Lumbar Assessment: Within Functional Limits Postural Control Postural Control: Deficits on evaluation Righting Reactions: delayed but improved Protective Responses: delayed but improved Postural Limitations: retropulsion with transitioning from sit<>stand without UE support  Balance Balance Balance Assessed: Yes Static Sitting Balance Static Sitting - Balance Support: Feet supported;No upper extremity supported Static Sitting - Level of Assistance: 7: Independent Dynamic Sitting Balance Dynamic Sitting -  Balance Support: Feet supported;No upper extremity supported Dynamic Sitting - Level of Assistance: 6: Modified independent (Device/Increase time) Static Standing Balance Static Standing - Balance Support: Bilateral upper extremity supported;During functional activity (RW) Static Standing - Level of Assistance: 5: Stand by assistance (supervision) Dynamic Standing Balance Dynamic Standing - Balance Support: During functional activity;Bilateral upper extremity supported (RW) Dynamic Standing - Level of Assistance: 5: Stand by assistance (supervision) Dynamic Standing - Comments: with transfers and gait Extremity/Trunk Assessment RUE Assessment RUE Assessment: Within Functional Limits General Strength Comments: Limited by sternal precautions and hx of R shoulder weakness, overall functional LUE Assessment LUE Assessment: Within Functional Limits General Strength Comments: Limited by sternal precautions and hx of R shoulder weakness, overall functional   Collin Oyen [name], MS, OTR/L  09/24/2022, 12:58 PM

## 2022-09-22 NOTE — Progress Notes (Addendum)
Patient ID: Collin [name], male   DOB: [birth year], 82 y.o.   MRN: [id number]  Plan to see if can borrow tub seat form church but plan on going ahead and getting rw and 3 in 1 via [employer] gave number for wife to call and pay the co-pay. Have given wife handicapped application for temporary placard. Wife to follow up with this.

## 2022-09-23 MED ORDER — BISACODYL 5 MG PO TBEC
5.0000 mg | DELAYED_RELEASE_TABLET | Freq: Once | ORAL | Status: AC
  Administered 2022-09-23: 5 mg via ORAL
  Filled 2022-09-23: qty 1

## 2022-09-23 NOTE — Progress Notes (Signed)
Physical Therapy Session Note  Patient Details  Name: Collin [name] Rape MRN: [id number] Date of Birth: [birth year]  Today's Date: 09/23/2022 PT Individual Time: 1045-1115 PT Individual Time Calculation (min): 30 min   Short Term Goals: Week 2:  PT Short Term Goal 1 (Week 2): STG=LTG due to LOS  Skilled Therapeutic Interventions/Progress Updates:    Pt received in recliner and agreeable to therapy.  No complaint of pain. Pt's equipment delivered at start of session, so therapist checked fit of RW.  Pt performed 2 x 10 Sit to stand from low recliner, Several instances of small posterior LOB for which therapist provided CGA but pt was largely able to self correct using stepping strategies. Pt reports he could not do another set and still go for a walk d/t fatigue, demoing good awareness of energy conservation.  Pt ambulated x~400 ft with RW and close supervision-CGA d/t new RW which sticks d/t new rubber feet. Advised pt he could use tennis balls or get different glides from a pharmacy for improved glide. Cues for upright posture and RW proximity.   Pt returned to room and therapist provided education on fall recovery and activating EMS or calling for help. Pt states he wears an apple watch which is enabled with fall detection.   Pt remained in recliner and was left with all needs in reach and alarm active.   Therapy Documentation Precautions:  Precautions Precautions: Fall, Sternal Precaution Booklet Issued: Yes (comment) Precaution Comments: educated pt on sternal precautions Required Braces or Orthoses: Other Brace Other Brace: wears L foot up brace for foot drop Restrictions Weight Bearing Restrictions: No RUE Weight Bearing: Weight bearing as tolerated LUE Weight Bearing: Weight bearing as tolerated RLE Weight Bearing: Weight bearing as tolerated LLE Weight Bearing: Weight bearing as tolerated Other Position/Activity Restrictions: sternal precautions General:       Therapy/Group: Individual Therapy  Juluis Rainier 09/23/2022, 12:49 PM

## 2022-09-23 NOTE — Progress Notes (Signed)
Physical Therapy Session Note  Patient Details  Name: Collin [name] MRN: [id number] Date of Birth: 1940/03/17  Today's Date: 09/23/2022 PT Individual Time: [phone] and 1401-1501 PT Individual Time Calculation (min): 57 min and 60 min  Short Term Goals: Week 1:  PT Short Term Goal 1 (Week 1): Patient will complete sit<>stand with CGA. PT Short Term Goal 1 - Progress (Week 1): Met PT Short Term Goal 2 (Week 1): Pt will ambulate >150' with CGA with RW on level surfaces. PT Short Term Goal 2 - Progress (Week 1): Met PT Short Term Goal 3 (Week 1): Patient will recall and verbalize sternal precautions. PT Short Term Goal 3 - Progress (Week 1): Met Week 2:  PT Short Term Goal 1 (Week 2): STG=LTG due to LOS  Skilled Therapeutic Interventions/Progress Updates:   Treatment Session 1 Received pt sitting in WC with NT present - PT took over with care. Pt agreeable to PT treatment and denied any pain during session but reported increased dizziness this morning. Donned knee high teds, shoes, with L foot up brace with max A. Session with emphasis on discharge planning, functional mobility/transfers, generalized strengthening and endurance, dynamic standing balance/coordination, gait training, and stair navigation. Went through sensation, MMT, and pain interference questionnaire in preparation for discharge. Pt stood from low sitting WC with RW x 2 trials with min A with 1UE support on RW for balance. Pt ambulated 179ft x 2 with RW and CGA fading to supervision to/from main therapy gym. Took seated rest break, then navigated 16 6in steps with R handrail and close supervision using a lateral stepping technique to simulate flight of steps pt has at home. Pt able to stand with RW and pick up squigz from floor with supervision with and without reacher. Worked on blocked practice sit<>stands x10 with 1UE support on RW and supervision and x10 without UE support and supervision - no retropulsion noted. Of note, pt  stood best when placing 1UE support on RW for balance with caution not to push down. Returned to room and concluded session with pt sitting in recliner, needs within reach, and seatbelt alarm on.   Vitals: BP sitting in WC: 105/71 - pt reporting dizziness BP standing at [facility]: 87/58 - pt reporting dizziness BP sitting after ambulating to gym: 124/68 - pt reporting symptoms improved  BP sitting after stair navigation: 116/60 - pt reporting dizziness BP sitting after sit<>stands: 114/62 - pt reporting dizziness   Treatment Session 2 Received pt sitting in recliner with NT checking vitals. Pt agreeable to PT treatment and denied any pain during session. Session with emphasis on functional mobility/transfers, generalized strengthening and endurance, dynamic standing balance/coordination, and gait training. Pt able to perform transfers with RW and close supervision throughout session with 1UE support on RW but caution to avoid pushing. Pt ambulated 166ft with RW and supervision to dayroom and stepped on/off scale with CGA for balance to weight himself. Pt then ambulated to Nustep and performed seated BLE strengthening on Nustep at workload 4 for 8 minutes and 20 seconds for a total of 557 steps with emphasis on cardiovascular endurance. Worked on dynamic standing balance dribbling basketball for 1 minute x 3 trials with CGA for balance with emphasis on reaction time, coordination, proprioception, and endurance. Pt then performed standing squats 4x10 with CGA and emphasis on quad/glute strength. Ambulated ~244ft with RW and supervision back to room and requested to toilet. Pt able to stand and void with close supervision for balance. Concluded session with  pt sitting in recliner, needs within reach, and seatbelt alarm on.   Vitals: BP sitting in recliner: 118/71 - pt asymptomatic BP sitting after Nustep: 130/74 - pt asymptomatic  BP sitting after dribbling basketball: 129/81 - pt asymptomatic  BP sitting  after squats: 126/78 - pt asymptomatic   Therapy Documentation Precautions:  Precautions Precautions: Fall, Sternal Precaution Booklet Issued: Yes (comment) Precaution Comments: educated pt on sternal precautions Other Brace: wears L foot brace for h/o foot drop Restrictions Weight Bearing Restrictions: No RUE Weight Bearing: Weight bearing as tolerated LUE Weight Bearing: Weight bearing as tolerated RLE Weight Bearing: Weight bearing as tolerated LLE Weight Bearing: Weight bearing as tolerated Other Position/Activity Restrictions: sternal precautions  Therapy/Group: Individual Therapy Marlana Salvage Zaunegger Blima Rich PT, DPT 09/23/2022, 7:02 AM

## 2022-09-23 NOTE — Progress Notes (Signed)
Physical Therapy Discharge Summary  Patient Details  Name: Collin [name] MRN: [id number] Date of Birth: 03-02-1940  Date of Discharge from PT service:September 23, 2022  Patient has met 7 of 8 long term goals due to improved activity tolerance, improved balance, improved postural control, increased strength, ability to compensate for deficits, improved awareness, and improved coordination. Patient to discharge at an ambulatory level Supervision using RW.  Patient's care partner is independent to provide the necessary physical assistance at discharge. Pt's wife attended family education training on 8/28 and verbalized and demonstrated confidence with tasks to ensure safe discharge home.   Reasons goals not met: Pt did not meet dynamic standing goal of mod I as pt currently requires supervision due to decreased standing balance/coordination.   Recommendation:  Patient will benefit from ongoing skilled PT services in outpatient setting to continue to advance safe functional mobility, address ongoing impairments in transfers, generalized strengthening and endurance, dynamic standing balance/coordination, NMR, gait training, and to minimize fall risk.  Equipment: RW  Reasons for discharge: treatment goals met  Patient/family agrees with progress made and goals achieved: Yes  PT Discharge Precautions/Restrictions Precautions Precautions: Fall;Sternal Required Braces or Orthoses: Other Brace Other Brace: wears L foot up brace for foot drop Restrictions Weight Bearing Restrictions: No Pain Interference Pain Interference Pain Effect on Sleep: 1. Rarely or not at all Pain Interference with Therapy Activities: 1. Rarely or not at all Pain Interference with Day-to-Day Activities: 1. Rarely or not at all Cognition Overall Cognitive Status: Within Functional Limits for tasks assessed Arousal/Alertness: Awake/alert Orientation Level: Oriented X4 Memory: Appears intact Awareness: Appears  intact Problem Solving: Appears intact Safety/Judgment: Impaired Comments: decreased insight into deficits Sensation Sensation Light Touch: Impaired Detail Light Touch Impaired Details: Impaired RLE;Impaired LLE Hot/Cold: Not tested Proprioception: Impaired by gross assessment Stereognosis: Not tested Additional Comments: decreased sensation LLE>RLE and absent sensation along bilateral great toes Coordination Gross Motor Movements are Fluid and Coordinated: No Fine Motor Movements are Fluid and Coordinated: No Coordination and Movement Description: Mild LLE foot drop with foot up brace, global deconditioning, decreased balance strategies Finger Nose Finger Test: dysmetria LUE>RUE Heel Shin Test: slow and uncoordinated bilaterally Motor  Motor Motor: Other (comment) Motor - Skilled Clinical Observations: Mild LLE foot drop with foot up brace, global deconditioning, decreased balance strategies  Mobility Bed Mobility Bed Mobility: Rolling Right;Rolling Left;Sit to Supine;Supine to Sit Rolling Right: Independent with assistive device Rolling Left: Independent with assistive device Supine to Sit: Independent with assistive device Sit to Supine: Independent with assistive device Transfers Transfers: Sit to Stand;Stand to Sit;Stand Pivot Transfers Sit to Stand: Supervision/Verbal cueing Stand to Sit: Supervision/Verbal cueing Stand Pivot Transfers: Supervision/Verbal cueing Transfer (Assistive device): Rolling walker Locomotion  Gait Ambulation: Yes Gait Assistance: Supervision/Verbal cueing Gait Distance (Feet): 765 Feet Assistive device: Rolling walker Gait Gait: Yes Gait Pattern: Impaired Gait Pattern: Step-through pattern;Poor foot clearance - right;Poor foot clearance - left;Narrow base of support;Scissoring Gait velocity: decreased Stairs / Additional Locomotion Stairs: Yes Stairs Assistance: Supervision/Verbal cueing Stair Management Technique: One rail Right Number  of Stairs: 16 Height of Stairs: 6 Ramp: Supervision/Verbal cueing (RW) Pick up small object from the floor assist level: Supervision/Verbal cueing Pick up small object from the floor assistive device: RW Wheelchair Mobility Wheelchair Mobility: No  Trunk/Postural Assessment  Cervical Assessment Cervical Assessment: Within Functional Limits Thoracic Assessment Thoracic Assessment: Within Functional Limits Lumbar Assessment Lumbar Assessment: Within Functional Limits Postural Control Postural Control: Deficits on evaluation Righting Reactions: delayed but improved Protective Responses:  delayed but improved Postural Limitations: retropulsion with transitioning from sit<>stand without UE support  Balance Balance Balance Assessed: Yes Standardized Balance Assessment Standardized Balance Assessment: Berg Balance Test Berg Balance Test Sit to Stand: Needs minimal aid to stand or to stabilize Standing Unsupported: Able to stand 2 minutes with supervision Sitting with Back Unsupported but Feet Supported on Floor or Stool: Able to sit safely and securely 2 minutes Stand to Sit: Sits safely with minimal use of hands Transfers: Needs one person to assist Standing Unsupported with Eyes Closed: Able to stand 10 seconds with supervision Standing Ubsupported with Feet Together: Needs help to attain position but able to stand for 30 seconds with feet together From Standing, Reach Forward with Outstretched Arm: Reaches forward but needs supervision From Standing Position, Pick up Object from Floor: Unable to try/needs assist to keep balance From Standing Position, Turn to Look Behind Over each Shoulder: Looks behind one side only/other side shows less weight shift Turn 360 Degrees: Needs assistance while turning Standing Unsupported, Alternately Place Feet on Step/Stool: Able to complete >2 steps/needs minimal assist Standing Unsupported, One Foot in Front: Needs help to step but can hold 15  seconds Standing on One Leg: Unable to try or needs assist to prevent fall Total Score: 23 Static Sitting Balance Static Sitting - Balance Support: Feet supported;No upper extremity supported Static Sitting - Level of Assistance: 7: Independent Dynamic Sitting Balance Dynamic Sitting - Balance Support: Feet supported;No upper extremity supported Dynamic Sitting - Level of Assistance: 6: Modified independent (Device/Increase time) Static Standing Balance Static Standing - Balance Support: Bilateral upper extremity supported;During functional activity (RW) Static Standing - Level of Assistance: 5: Stand by assistance (supervision) Dynamic Standing Balance Dynamic Standing - Balance Support: During functional activity;Bilateral upper extremity supported (RW) Dynamic Standing - Level of Assistance: 5: Stand by assistance (supervision) Dynamic Standing - Comments: with transfers and gait Extremity Assessment  RLE Assessment RLE Assessment: Exceptions to [facility] Strength Comments: tested sitting in WC RLE Strength Right Hip Flexion: 4+/5 Right Hip ABduction: 4+/5 Right Hip ADduction: 4+/5 Right Knee Flexion: 4+/5 Right Knee Extension: 4+/5 Right Ankle Dorsiflexion: 4/5 Right Ankle Plantar Flexion: 4+/5 LLE Assessment LLE Assessment: Exceptions to [facility] General Strength Comments: tested sitting in WC LLE Strength Left Hip Flexion: 4+/5 Left Hip ABduction: 4+/5 Left Hip ADduction: 4+/5 Left Knee Flexion: 4+/5 Left Knee Extension: 4+/5 Left Ankle Dorsiflexion: 2/5 Left Ankle Plantar Flexion: 3+/5   Marlana Salvage Zaunegger Blima Rich PT, DPT 09/23/2022, 7:11 AM

## 2022-09-23 NOTE — Progress Notes (Signed)
Inpatient Rehabilitation Care Coordinator Discharge Note   Patient Details  Name: Collin [name] MRN: [id number] Date of Birth: 05-20-1940   Discharge location: HOME WITH WIFE WHO CAN PROVIDE 24/7 SUPERVISION  Length of Stay: 15 DAYS  Discharge activity level: SUPERVISION LEVEL  Home/community participation: ACTIVE  Patient response QI:HKVQQV Literacy - How often do you need to have someone help you when you read instructions, pamphlets, or other written material from your doctor or pharmacy?: Never  Patient response ZD:GLOVFI Isolation - How often do you feel lonely or isolated from those around you?: Never  Services provided included: MD, RD, PT, OT, RN, CM, Pharmacy, SW  Financial Services:  Financial Services Utilized: Private Insurance [employer]AGE  Choices offered to/list presented to: PT AND WIFE  Follow-up services arranged:  Outpatient, DME, Patient/Family has no preference for HH/DME agencies    Outpatient Servicies: [facility] [address]  PT  & OT WILL CALL TO SET UP FOLLOW UP APPOINTMENTS DME : [facility]  ROLLING WALKER AND 3 IN 1 WILL GET TUB SEAT ON OWN    Patient response to transportation need: Is the patient able to respond to transportation needs?: Yes In the past 12 months, has lack of transportation kept you from medical appointments or from getting medications?: No In the past 12 months, has lack of transportation kept you from meetings, work, or from getting things needed for daily living?: No   Patient/Family verbalized understanding of follow-up arrangements:  Yes  Individual responsible for coordination of the follow-up plan: [name]  [phone]  Confirmed correct DME delivered: [name] 09/23/2022    Comments (or additional information):WIFE WAS HERE FOR MULTIPLE DAYS OF EDUCATION FEELS COMFORTABLE WITH DISCHARGE HOME  Summary of Stay    Date/Time Discharge Planning CSW  09/21/22 1419 Wife will be  here at 1:00 fro family training and have set up OP rehab via [address] have ordered via [employer]-rolling walker and 3 in 1 and wiull await OT recommendations for tub equipment. Wife aware will need 24/7 supervision at discharge [name]  09/14/22 1317 Patient discharging home with spouse to assist 24/7 supervison. 3 steps to enter the home. [name]       [name], [name]

## 2022-09-23 NOTE — Progress Notes (Signed)
Occupational Therapy Session Note  Patient Details  Name: Collin [name] MRN: [id number] Date of Birth: May 19, 1940  Today's Date: 09/23/2022 OT Individual Time: 9518-8416 OT Individual Time Calculation (min): 47 min    Short Term Goals: Week 1:  OT Short Term Goal 1 (Week 1): Pt will don pants with CGA OT Short Term Goal 1 - Progress (Week 1): Met OT Short Term Goal 2 (Week 1): Pt will complete sit <> stand with min A consistently OT Short Term Goal 2 - Progress (Week 1): Met OT Short Term Goal 3 (Week 1): Pt will complete toileting tasks with min A OT Short Term Goal 3 - Progress (Week 1): Not met Week 2:  OT Short Term Goal 1 (Week 2): STG = LTG 2/2 ELOS  Skilled Therapeutic Interventions/Progress Updates:    1:1 Pt received in the recliner. GRAD DAY. Pt participated in bathing at shower level and dressing with extra time. Pt reports feeling dizzy today but BP had been ok in previous session with therapeutic exercises.  Pt ambulated to the bathroom and doff clothing sitting on tub bench. Pt reported his family got a shower chair for him - advised since he will not have TEDS or abdominal binder on in shower and BP could drop. Pt showered with supervision. Pt transitioned to chair in shower to dress and able to dress with supervision with A to donn TEDS. Pt declined the abdominal binder at this time. Pt reported fatigue at the sink brushing his hair and deferred shaving and teeth brushing until later. Pt left in recliner with nursing in the room taking his morning meds. Pt left sitting up with call bell in hand.   Therapy Documentation Precautions:  Precautions Precautions: Fall, Sternal Precaution Booklet Issued: Yes (comment) Precaution Comments: educated pt on sternal precautions Required Braces or Orthoses: Other Brace Other Brace: wears L foot up brace for foot drop Restrictions Weight Bearing Restrictions: No RUE Weight Bearing: Weight bearing as tolerated LUE Weight Bearing:  Weight bearing as tolerated RLE Weight Bearing: Weight bearing as tolerated LLE Weight Bearing: Weight bearing as tolerated Other Position/Activity Restrictions: sternal precautions    Vital Signs: BP in standing at the sink after shower with only TEDS on was 103/60s- pt reported tired and went to sit down in the recliner and reported fatigue. Encouraged fluids and to sit down.  Pain: No c/o pain in session    Therapy/Group: Individual Therapy  Roney Mans [facility] 09/23/2022, 3:08 PM

## 2022-09-23 NOTE — Progress Notes (Signed)
PROGRESS NOTE   Subjective/Complaints: No new complaints this morning Stable for d/c tomorrow Last BM listed as 8/25, messaged nursing to check if he would like to try Dulcolax.   ROS: Patient denies fever, rash, sore throat, blurred vision, nausea, vomiting, diarrhea, cough, shortness of breath or chest pain, joint or back/neck pain, headache, or mood change. Dizziness improved, +constipation  Objective:   No results found. No results for input(s): "WBC", "HGB", "HCT", "PLT" in the last 72 hours.  No results for input(s): "NA", "K", "CL", "CO2", "GLUCOSE", "BUN", "CREATININE", "CALCIUM" in the last 72 hours.   Intake/Output Summary (Last 24 hours) at 09/23/2022 1035 Last data filed at 09/23/2022 0737 Gross per 24 hour  Intake 590 ml  Output 1750 ml  Net -1160 ml        Physical Exam: Vital Signs Blood pressure 111/60, pulse 72, temperature 98.3 F (36.8 C), resp. rate 18, height 6\' 1"  (1.854 m), weight 84.4 kg, SpO2 95%. Gen: no distress, normal appearing, BMI 23.88 Gen: no distress, normal appearing HEENT: oral mucosa pink and moist, NCAT Cardio: Reg rate Chest: normal effort, normal rate of breathing Abd: soft, non-distended Ext: no edema Psych: pleasant, normal affect Skin: intact   Musculoskeletal:        General: No swelling.     Cervical back: Neck supple. No tenderness.     Comments: UE strength 5-/5 B/L- painful with R deltoid- but still 4+/5 Les- HF 5-/5; KE 5-/5; R DF and PF 5-/5 L DF 3+/5 and PF 5-/5   Skin:    General: Skin is warm and dry.     Comments: Sternal incision CDI Incision on R leg x2- near groin and R calf- look good Severe purple bruising posterior proximal Thigh on R- medial scab over the saphenous v region , no induration no eythema no tenderness to palpation  2 IV's LUE- areas mproving  Neurological:     General: No focal deficit present.     Mental Status: He is alert and  oriented to person, place, and time.     Comments: Decreased to light touch from knees to toes B/L- worse on LLE Higher level cognition- slightly delayed responses- even with repetition-  Cannot follow 3 step command, but can 1 step and slowed on 2 step command.      Assessment/Plan: 1. Functional deficits which require 3+ hours per day of interdisciplinary therapy in a comprehensive inpatient rehab setting. Physiatrist is providing close team supervision and 24 hour management of active medical problems listed below. Physiatrist and rehab team continue to assess barriers to discharge/monitor patient progress toward functional and medical goals  Care Tool:  Bathing  Bathing activity did not occur: Refused Body parts bathed by patient: Right arm, Left arm, Chest, Abdomen, Front perineal area, Buttocks, Right upper leg, Left upper leg, Right lower leg, Left lower leg, Face         Bathing assist Assist Level: Supervision/Verbal cueing     Upper Body Dressing/Undressing Upper body dressing   What is the patient wearing?: Pull over shirt    Upper body assist Assist Level: Supervision/Verbal cueing    Lower Body Dressing/Undressing Lower body dressing  What is the patient wearing?: Underwear/pull up, Pants     Lower body assist Assist for lower body dressing: Contact Guard/Touching assist     Toileting Toileting    Toileting assist Assist for toileting: Contact Guard/Touching assist     Transfers Chair/bed transfer  Transfers assist     Chair/bed transfer assist level: Supervision/Verbal cueing     Locomotion Ambulation   Ambulation assist      Assist level: Supervision/Verbal cueing Assistive device: Walker-rolling Max distance: 723ft   Walk 10 feet activity   Assist     Assist level: Supervision/Verbal cueing Assistive device: Walker-rolling   Walk 50 feet activity   Assist    Assist level: Supervision/Verbal cueing Assistive device:  Walker-rolling    Walk 150 feet activity   Assist    Assist level: Supervision/Verbal cueing Assistive device: Walker-rolling    Walk 10 feet on uneven surface  activity   Assist     Assist level: Supervision/Verbal cueing Assistive device: Walker-rolling   Wheelchair     Assist Is the patient using a wheelchair?: No Type of Wheelchair: Manual Wheelchair activity did not occur: N/A  Wheelchair assist level: Contact Guard/Touching assist Max wheelchair distance: 50    Wheelchair 50 feet with 2 turns activity    Assist    Wheelchair 50 feet with 2 turns activity did not occur: N/A   Assist Level: Contact Guard/Touching assist   Wheelchair 150 feet activity     Assist  Wheelchair 150 feet activity did not occur: N/A   Assist Level: Maximal Assistance - Patient 25 - 49%   Blood pressure 111/60, pulse 72, temperature 98.3 F (36.8 C), resp. rate 18, height 6\' 1"  (1.854 m), weight 84.4 kg, SpO2 95%.    Medical Problem List and Plan: 1. Functional deficits secondary to debility due to CABG x3 due to STEMI             -patient may  shower-cover incisions             -ELOS/Goals: 7-10 days mod I             Grounds pass ordered  -Continue CIR therapies including PT, OT    2.  Antithrombotics: -DVT/anticoagulation:  Pharmaceutical: Lovenox start renal dose Lovenox             -antiplatelet therapy: Aspirin and Plavix    3. Pain Management: Tylenol as needed   4. Mood/Behavior/Sleep: LCSW to evaluate and provide emotional support             -antipsychotic agents: n/a   5. Neuropsych/cognition: This patient is? capable of making decisions on his own behalf.   6. Skin/Wound Care: Routine skin care checks             -monitor chest and leg incisions, echymosis no evidence of infection R thigh    7. Fluids/Electrolytes/Nutrition: Routine Is and Os and follow-up chemistries             -continue potassium supplement   8: Hypertension: monitor TID  and prn (meds see #10)   9: Hyperlipidemia: continue statin   10: CAD s/p CABG 8/07:             -sternal precautions             -continue amiodarone              continue aspirin and Plavix             -continue Lopressor             -  follow-up with Dr. Leafy Ro   11: BPH: conitnue Flomax   12: ABLA: follow-up CBC   13: Volume overload: TEE with normal LV function and EF ~60-65% -Lasix d/ced by cardiology given hypotension             -monitor volume status/daily weight   -weights down Filed Weights   09/19/22 0405 09/22/22 0644 09/23/22 0700  Weight: 82.1 kg 86.1 kg 84.4 kg    14: AKI: f/u BMP ordered but patient refused labs             Lasix d/ced by cardiology, discussed with patient     Latest Ref Rng & Units 09/10/2022    6:20 AM 09/09/2022    5:00 PM 09/07/2022    1:33 AM  BMP  Glucose 70 - 99 mg/dL 161  096  045   BUN 8 - 23 mg/dL 21  23  19    Creatinine 0.61 - 1.24 mg/dL 4.09  8.11  9.14   Sodium 135 - 145 mmol/L 134  135  134   Potassium 3.5 - 5.1 mmol/L 4.1  4.2  3.8   Chloride 98 - 111 mmol/L 100  95  100   CO2 22 - 32 mmol/L 26  27  25    Calcium 8.9 - 10.3 mg/dL 8.6  8.8  8.2      15: Prediabetes continue carb modified diet, d/c CBG checks and insulin   16: pAF: continue amiodarone 200 mg BID for seven days then continue on 200 mg daily at discharge, discussed this decrease which is set for 8/24 with patient and therapy, discussed that orthostasis will likely improve once this medication is decreased  -changed lopressor to Toprol XL at night, no further recommendations as per cardiology.              -follow-up with Dr. Dayle Points  -continue magnesium supplement  -HR controlled  17. Higher level cognitive issues complicated by [facility] pt wants ot wait on SLP order for now.    18. Constipation:   -added scheduled miralax and senna-s  Chart reviewed and last BM was 8/25, decrease senna-docusate to 1 tab HS, asked nursing to bring him prune juice, increased  miralax to BID  Messaged nursing to see if he would like to try Dulcolax.   19. Orthostasis: changed Lopressor to Toprol XL HS, cardiology consulted, lasix d/ced, continue teds and abdominal binder       LOS: 14 days A FACE TO FACE EVALUATION WAS PERFORMED  Clint Bolder P Colden Samaras 09/23/2022, 10:35 AM

## 2022-09-23 NOTE — Plan of Care (Signed)
Problem: Sit to Stand Goal: LTG:  Patient will perform sit to stand in prep for activites of daily living with assistance level (OT) Description: LTG:  Patient will perform sit to stand in prep for activites of daily living with assistance level (OT) Outcome: Completed/Met   Problem: RH Bathing Goal: LTG Patient will bathe all body parts with assist levels (OT) Description: LTG: Patient will bathe all body parts with assist levels (OT) Outcome: Completed/Met   Problem: RH Dressing Goal: LTG Patient will perform lower body dressing w/assist (OT) Description: LTG: Patient will perform lower body dressing with assist, with/without cues in positioning using equipment (OT) Outcome: Completed/Met   Problem: RH Toileting Goal: LTG Patient will perform toileting task (3/3 steps) with assistance level (OT) Description: LTG: Patient will perform toileting task (3/3 steps) with assistance level (OT)  Outcome: Completed/Met   Problem: RH Toilet Transfers Goal: LTG Patient will perform toilet transfers w/assist (OT) Description: LTG: Patient will perform toilet transfers with assist, with/without cues using equipment (OT) Outcome: Completed/Met   Problem: RH Tub/Shower Transfers Goal: LTG Patient will perform tub/shower transfers w/assist (OT) Description: LTG: Patient will perform tub/shower transfers with assist, with/without cues using equipment (OT) Outcome: Completed/Met

## 2022-09-24 ENCOUNTER — Other Ambulatory Visit (HOSPITAL_COMMUNITY): Payer: Self-pay

## 2022-09-24 MED ORDER — ATORVASTATIN CALCIUM 80 MG PO TABS
80.0000 mg | ORAL_TABLET | Freq: Every day | ORAL | 0 refills | Status: DC
  Filled 2022-09-24: qty 30, 30d supply, fill #0

## 2022-09-24 MED ORDER — METOPROLOL SUCCINATE ER 25 MG PO TB24
12.5000 mg | ORAL_TABLET | Freq: Every day | ORAL | 0 refills | Status: DC
  Filled 2022-09-24: qty 15, 30d supply, fill #0

## 2022-09-24 MED ORDER — SENNOSIDES-DOCUSATE SODIUM 8.6-50 MG PO TABS: 1.0000 | ORAL_TABLET | Freq: Every day | ORAL | Status: DC

## 2022-09-24 MED ORDER — PSYLLIUM 95 % PO PACK: 1.0000 | PACK | Freq: Every day | ORAL | Status: DC

## 2022-09-24 MED ORDER — AMIODARONE HCL 200 MG PO TABS
200.0000 mg | ORAL_TABLET | Freq: Every day | ORAL | 0 refills | Status: DC
  Filled 2022-09-24: qty 30, 30d supply, fill #0

## 2022-09-24 MED ORDER — POLYETHYLENE GLYCOL 3350 17 G PO PACK: 17.0000 g | PACK | Freq: Two times a day (BID) | ORAL | Status: DC

## 2022-09-24 MED ORDER — ACETAMINOPHEN 325 MG PO TABS: 325.0000 mg | ORAL_TABLET | ORAL | Status: DC | PRN

## 2022-09-24 MED ORDER — MELATONIN 3 MG PO TABS: 3.0000 mg | ORAL_TABLET | Freq: Every day | ORAL | Status: DC

## 2022-09-24 MED ORDER — CLOPIDOGREL BISULFATE 75 MG PO TABS
75.0000 mg | ORAL_TABLET | Freq: Every day | ORAL | 0 refills | Status: DC
  Filled 2022-09-24: qty 30, 30d supply, fill #0

## 2022-09-24 MED ORDER — TRAZODONE HCL 50 MG PO TABS
25.0000 mg | ORAL_TABLET | Freq: Every day | ORAL | 0 refills | Status: DC
  Filled 2022-09-24: qty 15, 30d supply, fill #0

## 2022-09-24 MED ORDER — ASCORBIC ACID 500 MG PO TABS
250.0000 mg | ORAL_TABLET | Freq: Every day | ORAL | 0 refills | Status: AC
  Filled 2022-09-24: qty 100, 200d supply, fill #0

## 2022-09-24 MED ORDER — NYSTATIN 100000 UNIT/GM EX POWD: Freq: Two times a day (BID) | CUTANEOUS | Status: DC

## 2022-09-24 NOTE — Progress Notes (Signed)
PROGRESS NOTE   Subjective/Complaints: No new complaints this morning Lightheadedness is stable Feels ready to go home Wife is coming at 9am  ROS: Patient denies fever, rash, sore throat, blurred vision, nausea, vomiting, diarrhea, cough, shortness of breath or chest pain, joint or back/neck pain, headache, or mood change. Dizziness improved, +constipation- resolved  Objective:   No results found. No results for input(s): "WBC", "HGB", "HCT", "PLT" in the last 72 hours.  No results for input(s): "NA", "K", "CL", "CO2", "GLUCOSE", "BUN", "CREATININE", "CALCIUM" in the last 72 hours.   Intake/Output Summary (Last 24 hours) at 09/24/2022 0910 Last data filed at 09/24/2022 0809 Gross per 24 hour  Intake 712 ml  Output 1000 ml  Net -288 ml        Physical Exam: Vital Signs Blood pressure 131/85, pulse 72, temperature 98.1 F (36.7 C), resp. rate 17, height 6\' 1"  (1.854 m), weight 84.3 kg, SpO2 96%. Gen: no distress, normal appearing, BMI 23.88 Gen: no distress, normal appearing HEENT: oral mucosa pink and moist, NCAT Cardio: Reg rate Chest: normal effort, normal rate of breathing Abd: soft, non-distended Ext: no edema Psych: pleasant, normal affect    Musculoskeletal:        General: No swelling.     Cervical back: Neck supple. No tenderness.     Comments: UE strength 5-/5 B/L- painful with R deltoid- but still 4+/5 Les- HF 5-/5; KE 5-/5; R DF and PF 5-/5 L DF 3+/5 and PF 5-/5   Skin:    General: Skin is warm and dry.     Comments: Sternal incision CDI Incision on R leg x2- near groin and R calf- look good Severe purple bruising posterior proximal Thigh on R- medial scab over the saphenous v region , no induration no eythema no tenderness to palpation  2 IV's LUE- areas mproving  Neurological:     General: No focal deficit present.     Mental Status: He is alert and oriented to person, place, and time.      Comments: Decreased to light touch from knees to toes B/L- worse on LLE Higher level cognition- slightly delayed responses- even with repetition-  Cannot follow 3 step command, but can 1 step and slowed on 2 step command.      Assessment/Plan: 1. Functional deficits which require 3+ hours per day of interdisciplinary therapy in a comprehensive inpatient rehab setting. Physiatrist is providing close team supervision and 24 hour management of active medical problems listed below. Physiatrist and rehab team continue to assess barriers to discharge/monitor patient progress toward functional and medical goals  Care Tool:  Bathing  Bathing activity did not occur: Refused Body parts bathed by patient: Right arm, Left arm, Chest, Abdomen, Front perineal area, Buttocks, Right upper leg, Left upper leg, Right lower leg, Left lower leg, Face         Bathing assist Assist Level: Supervision/Verbal cueing     Upper Body Dressing/Undressing Upper body dressing   What is the patient wearing?: Pull over shirt    Upper body assist Assist Level: Set up assist    Lower Body Dressing/Undressing Lower body dressing      What is the  patient wearing?: Underwear/pull up, Pants     Lower body assist Assist for lower body dressing: Set up assist     Toileting Toileting    Toileting assist Assist for toileting: Supervision/Verbal cueing     Transfers Chair/bed transfer  Transfers assist     Chair/bed transfer assist level: Supervision/Verbal cueing     Locomotion Ambulation   Ambulation assist      Assist level: Supervision/Verbal cueing Assistive device: Walker-rolling Max distance: 769ft   Walk 10 feet activity   Assist     Assist level: Supervision/Verbal cueing Assistive device: Walker-rolling   Walk 50 feet activity   Assist    Assist level: Supervision/Verbal cueing Assistive device: Walker-rolling    Walk 150 feet activity   Assist    Assist level:  Supervision/Verbal cueing Assistive device: Walker-rolling    Walk 10 feet on uneven surface  activity   Assist     Assist level: Supervision/Verbal cueing Assistive device: Walker-rolling   Wheelchair     Assist Is the patient using a wheelchair?: No Type of Wheelchair: Manual Wheelchair activity did not occur: N/A  Wheelchair assist level: Contact Guard/Touching assist Max wheelchair distance: 50    Wheelchair 50 feet with 2 turns activity    Assist    Wheelchair 50 feet with 2 turns activity did not occur: N/A   Assist Level: Contact Guard/Touching assist   Wheelchair 150 feet activity     Assist  Wheelchair 150 feet activity did not occur: N/A   Assist Level: Maximal Assistance - Patient 25 - 49%   Blood pressure 131/85, pulse 72, temperature 98.1 F (36.7 C), resp. rate 17, height 6\' 1"  (1.854 m), weight 84.3 kg, SpO2 96%.    Medical Problem List and Plan: 1. Functional deficits secondary to debility due to CABG x3 due to STEMI             -patient may  shower-cover incisions             -ELOS/Goals: 7-10 days mod I             Grounds pass ordered  -Continue CIR therapies including PT, OT    2.  Antithrombotics: -DVT/anticoagulation:  Pharmaceutical: Lovenox start renal dose Lovenox             -antiplatelet therapy: Aspirin and Plavix    3. Pain Management: Tylenol as needed   4. Mood/Behavior/Sleep: LCSW to evaluate and provide emotional support             -antipsychotic agents: n/a   5. Neuropsych/cognition: This patient is? capable of making decisions on his own behalf.   6. Skin/Wound Care: Routine skin care checks             -monitor chest and leg incisions, echymosis no evidence of infection R thigh    7. Fluids/Electrolytes/Nutrition: Routine Is and Os and follow-up chemistries             -continue potassium supplement   8: Hypertension: monitor TID and prn (meds see #10)   9: Hyperlipidemia: continue statin   10: CAD  s/p CABG 8/07:             -sternal precautions             -continue amiodarone              continue aspirin and Plavix             -continue Lopressor             -  follow-up with Dr. Leafy Ro   11: BPH: conitnue Flomax   12: ABLA: follow-up CBC   13: Volume overload: TEE with normal LV function and EF ~60-65% -Lasix d/ced by cardiology given hypotension             -monitor volume status/daily weight   -weights down Filed Weights   09/22/22 0644 09/23/22 0700 09/24/22 0429  Weight: 86.1 kg 84.4 kg 84.3 kg    14: AKI: f/u BMP ordered but patient refused labs             Lasix d/ced by cardiology, discussed with patient     Latest Ref Rng & Units 09/10/2022    6:20 AM 09/09/2022    5:00 PM 09/07/2022    1:33 AM  BMP  Glucose 70 - 99 mg/dL 865  784  696   BUN 8 - 23 mg/dL 21  23  19    Creatinine 0.61 - 1.24 mg/dL 2.95  2.84  1.32   Sodium 135 - 145 mmol/L 134  135  134   Potassium 3.5 - 5.1 mmol/L 4.1  4.2  3.8   Chloride 98 - 111 mmol/L 100  95  100   CO2 22 - 32 mmol/L 26  27  25    Calcium 8.9 - 10.3 mg/dL 8.6  8.8  8.2      15: Prediabetes continue carb modified diet, d/c CBG checks and insulin   16: pAF: continue amiodarone 200 mg BID for seven days then continue on 200 mg daily at discharge, discussed this decrease which is set for 8/24 with patient and therapy, discussed that orthostasis will likely improve once this medication is decreased  -changed lopressor to Toprol XL at night, no further recommendations as per cardiology.              -follow-up with Dr. Dayle Points  -continue magnesium supplement  -HR controlled  17. Higher level cognitive issues complicated by [facility]- pt wants ot wait on SLP order for now.    18. Constipation:   -added scheduled miralax and senna-s  Chart reviewed and last BM was 8/25, decrease senna-docusate to 1 tab HS, asked nursing to bring him prune juice, increased miralax to BID  Dulcolax administered with good effect  19. Orthostasis:  changed Lopressor to Toprol XL HS, cardiology consulted, lasix d/ced, continue teds and abdominal binder    >30 minutes spent in discharge of patient including review of medications and follow-up appointments, physical examination, and in answering all patient's questions     LOS: 15 days A FACE TO FACE EVALUATION WAS PERFORMED  Adryana Mogensen P Lakynn Halvorsen 09/24/2022, 9:10 AM

## 2022-09-24 NOTE — Progress Notes (Signed)
Inpatient Rehabilitation Discharge Medication Review by a Pharmacist  A complete drug regimen review was completed for this patient to identify any potential clinically significant medication issues.  High Risk Drug Classes Is patient taking? Indication by Medication  Antipsychotic No   Anticoagulant No   Antibiotic No   Opioid No   Antiplatelet Yes Aspirin, clopidogrel - CAD  Hypoglycemics/insulin No   Vasoactive Medication Yes Amiodarone, metoprolol - Afib Tamsulosin - BPH  Chemotherapy No   Other Yes Atorvastatin - HLD Trazodone/melatonin - sleep Nystatin, Vitamin C- wounds      Type of Medication Issue Identified Description of Issue Recommendation(s)  Drug Interaction(s) (clinically significant)     Duplicate Therapy     Allergy     No Medication Administration End Date     Incorrect Dose     Additional Drug Therapy Needed     Significant med changes from prior encounter (inform family/care partners about these prior to discharge).    Other       Clinically significant medication issues were identified that warrant physician communication and completion of prescribed/recommended actions by midnight of the next day:  No  Name of provider notified for urgent issues identified:   Provider Method of Notification:   Pharmacist comments: None  Time spent performing this drug regimen review (minutes):  20 minutes  Jani Gravel, PharmD Clinical Pharmacist  09/24/2022 7:40 AM

## 2022-09-24 NOTE — Plan of Care (Signed)
  Problem: Consults Goal: RH GENERAL PATIENT EDUCATION Description: See Patient Education module for education specifics. Outcome: Progressing   Problem: RH BOWEL ELIMINATION Goal: RH STG MANAGE BOWEL WITH ASSISTANCE Description: STG Manage Bowel with mod I Assistance. Outcome: Progressing   Problem: RH BLADDER ELIMINATION Goal: RH STG MANAGE BLADDER WITH ASSISTANCE Description: STG Manage Bladder With mod I Assistance Outcome: Progressing   Problem: RH SKIN INTEGRITY Goal: RH STG SKIN FREE OF INFECTION/BREAKDOWN Description: Incisions will remain intact and be free of infection/breakdown with min assist  Outcome: Progressing Goal: RH STG MAINTAIN SKIN INTEGRITY WITH ASSISTANCE Description: STG Maintain Skin Integrity With cueing Assistance. Outcome: Progressing   Problem: RH SAFETY Goal: RH STG ADHERE TO SAFETY PRECAUTIONS W/ASSISTANCE/DEVICE Description: STG Adhere to Safety Precautions With cueing Assistance/Device. Outcome: Progressing Goal: RH STG DECREASED RISK OF FALL WITH ASSISTANCE Description: STG Decreased Risk of Fall With min Assistance. Outcome: Progressing   Problem: RH PAIN MANAGEMENT Goal: RH STG PAIN MANAGED AT OR BELOW PT'S PAIN GOAL Description: Pain will be managed at 3 out of 10 on pain scale with PRN medications min assist  Outcome: Progressing   Problem: Education: Goal: Will demonstrate proper wound care and an understanding of methods to prevent future damage Outcome: Progressing Goal: Knowledge of disease or condition will improve Outcome: Progressing Goal: Knowledge of the prescribed therapeutic regimen will improve Outcome: Progressing Goal: Individualized Educational Video(s) Outcome: Progressing   Problem: Activity: Goal: Risk for activity intolerance will decrease Outcome: Progressing   Problem: Cardiac: Goal: Will achieve and/or maintain hemodynamic stability Outcome: Progressing   Problem: Respiratory: Goal: Respiratory status  will improve Outcome: Progressing   Problem: Skin Integrity: Goal: Wound healing without signs and symptoms of infection Outcome: Progressing Goal: Risk for impaired skin integrity will decrease Outcome: Progressing   Problem: Urinary Elimination: Goal: Ability to achieve and maintain adequate renal perfusion and functioning will improve Outcome: Progressing   Problem: Activity: Goal: Ability to tolerate increased activity will improve Outcome: Progressing   Problem: Cardiac: Goal: Ability to achieve and maintain adequate cardiovascular perfusion will improve Outcome: Progressing   Problem: Health Behavior/Discharge Planning: Goal: Ability to safely manage health-related needs after discharge will improve Outcome: Progressing   Problem: Activity: Goal: Ability to return to baseline activity level will improve Outcome: Progressing   Problem: Cardiovascular: Goal: Ability to achieve and maintain adequate cardiovascular perfusion will improve Outcome: Progressing   Problem: Health Behavior/Discharge Planning: Goal: Ability to safely manage health-related needs after discharge will improve Outcome: Progressing   Problem: Education: Goal: Ability to describe self-care measures that may prevent or decrease complications (Diabetes Survival Skills Education) will improve Outcome: Progressing Goal: Individualized Educational Video(s) Outcome: Progressing   Problem: Coping: Goal: Ability to adjust to condition or change in health will improve Outcome: Progressing   Problem: Fluid Volume: Goal: Ability to maintain a balanced intake and output will improve Outcome: Progressing   Problem: Health Behavior/Discharge Planning: Goal: Ability to identify and utilize available resources and services will improve Outcome: Progressing Goal: Ability to manage health-related needs will improve Outcome: Progressing   Problem: Metabolic: Goal: Ability to maintain appropriate glucose  levels will improve Outcome: Progressing   Problem: Nutritional: Goal: Maintenance of adequate nutrition will improve Outcome: Progressing Goal: Progress toward achieving an optimal weight will improve Outcome: Progressing   Problem: Skin Integrity: Goal: Risk for impaired skin integrity will decrease Outcome: Progressing   Problem: Tissue Perfusion: Goal: Adequacy of tissue perfusion will improve Outcome: Progressing

## 2022-09-28 ENCOUNTER — Telehealth: Payer: Self-pay | Admitting: Family Medicine

## 2022-09-28 ENCOUNTER — Telehealth: Payer: Self-pay

## 2022-09-28 NOTE — Telephone Encounter (Signed)
Pt wife called earlier and was transfer to triage nurse having symptom of heart attack and decline to go to ER and would like an appt with dr [place]

## 2022-09-28 NOTE — Telephone Encounter (Signed)
Spoke with patient BP was low this morning 85/57, Bp is now 96/63, @ 3:30 pm 103/64, patient is seeing Cardio on tues 10/05/2022, patient is to continue to monitoring BP drink fluids and if he is having any symptoms or if his blood pressure continues to be low he is to call the office or go to the ER. PER Dr. [place]

## 2022-09-28 NOTE — Transitions of Care (Post Inpatient/ED Visit) (Signed)
09/28/2022  Name: Collin [name] MRN: [id number] DOB: Jan 11, 1941  Today's TOC FU Call Status: Today's TOC FU Call Status:: Successful TOC FU Call Completed TOC FU Call Complete Date: 09/28/22 Patient's Name and Date of Birth confirmed.  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 Appetite good. He was having diarrhea but it resolved over weekend. Incision is healing.) Any questions or concerns?: No  Items Reviewed: Did you receive and understand the discharge instructions provided?: Yes Medications obtained,verified, and reconciled?: Yes (Medications Reviewed) Any new allergies since your discharge?: No Dietary orders reviewed?: Yes Type of Diet Ordered:: low salt/heart healthy Do you have support at home?: Yes People in Home: spouse Name of Support/Comfort Primary Source: Olegario Messier  Medications Reviewed Today: Medications Reviewed Today     Reviewed by Charlyn Minerva, RN (Registered Nurse) on 09/28/22 at [phone]  Med List Status: <None>   Medication Order Taking? Sig Documenting Provider Last Dose Status Informant  acetaminophen (TYLENOL) 325 MG tablet [id number] Yes Take 1-2 tablets (325-650 mg total) by mouth every 4 (four) hours as needed for mild pain. Setzer, Lynnell Jude, PA-C Taking Active   amiodarone (PACERONE) 200 MG tablet [id number] Yes Take 1 tablet (200 mg total) by mouth daily. Setzer, Lynnell Jude, PA-C Taking Active   ascorbic acid (VITAMIN C) 500 MG tablet [id number] Yes Take 0.5 tablets (250 mg total) by mouth daily. Milinda Antis, PA-C Taking Active   aspirin 81 MG tablet [id number] Yes  Take 1 tablet (81 mg total) by mouth at bedtime.  Patient taking differently: Take 81 mg by mouth daily.   Costella, Darci Current, PA-C Taking Active Spouse/Significant Other  atorvastatin (LIPITOR) 80 MG tablet [id number] Yes Take 1 tablet (80 mg total) by mouth daily. Setzer, Lynnell Jude, PA-C Taking Active   clopidogrel (PLAVIX) 75 MG tablet [id number] Yes Take 1 tablet (75 mg total) by mouth daily. Setzer, Lynnell Jude, PA-C Taking Active   melatonin 3 MG TABS tablet [id number] Yes Take 1 tablet (3 mg total) by mouth at bedtime. Setzer, Lynnell Jude, PA-C Taking Active   metoprolol succinate (TOPROL-XL) 25 MG 24 hr tablet [id number] Yes Take 0.5 tablets (12.5 mg total) by mouth at bedtime. Setzer, Lynnell Jude, PA-C Taking Active   Multiple Vitamins-Minerals (MENS 50+ MULTIVITAMIN) TABS [id number] Yes Take 1 tablet by mouth daily. [provider] Taking Active Spouse/Significant Other  nystatin (MYCOSTATIN/NYSTOP) powder [id number] Yes Apply topically 2 (two) times daily. Setzer, Lynnell Jude, PA-C Taking Active   polyethylene glycol (MIRALAX / GLYCOLAX) 17 g packet [id number]  Take 17 g by mouth 2 (two) times daily. Setzer, Lynnell Jude, PA-C  Active   psyllium (HYDROCIL/METAMUCIL) 95 % PACK [id number]  Take 1 packet by mouth daily. Setzer, Lynnell Jude, PA-C  Active   senna-docusate (SENOKOT-S) 8.6-50 MG tablet [id number]  Take 1 tablet by mouth at bedtime. Milinda Antis, PA-C  Active   tamsulosin (FLOMAX) 0.4 MG CAPS capsule [id number] Yes TAKE 1 CAPSULE(0.4 MG) BY MOUTH DAILY [place], Betty G, MD Taking Active Spouse/Significant Other, Pharmacy Records  traZODone (DESYREL) 50 MG tablet [id number] Yes Take 0.5 tablets (25 mg total) by mouth at bedtime. Setzer, Lynnell Jude, PA-C Taking  Active             Home Care and Equipment/Supplies: Were Home Health Services Ordered?: NA Any new equipment or medical supplies ordered?: Yes Name of Medical supply agency?: [employer], [employer] Were you able to get the  equipment/medical supplies?: Yes Do you have any questions related to the use of the equipment/supplies?: No  Functional Questionnaire: Do you need assistance with bathing/showering or dressing?: No Do you need assistance with meal preparation?: No Do you need assistance with eating?: No Do you have difficulty maintaining continence: No Do you need assistance with getting out of bed/getting out of a chair/moving?: No Do you have difficulty managing or taking your medications?: No  Follow up appointments reviewed: PCP Follow-up appointment confirmed?: No (Pt declined-states he is seeing several other MDs for follow up) MD Provider Line Number:[phone] Given: No Specialist Hospital Follow-up appointment confirmed?: Yes Date of Specialist follow-up appointment?: 10/05/22 Follow-Up Specialty Provider:: Dr. Leafy Ro,  Dr. Evans-10/13/22 Do you need transportation to your follow-up appointment?: No Do you understand care options if your condition(s) worsen?: Yes-patient verbalized understanding  SDOH Interventions Today    Flowsheet Row Most Recent Value  SDOH Interventions   Food Insecurity Interventions Intervention Not Indicated  Transportation Interventions Intervention Not Indicated        TOC Interventions Today    Flowsheet Row Most Recent Value  TOC Interventions   TOC Interventions Discussed/Reviewed TOC Interventions Discussed, Post op wound/incision care, S/S of infection, Post discharge activity limitations per provider      Interventions Today    Flowsheet Row Most Recent Value  Chronic Disease   Chronic disease during today's visit Hypertension (HTN)  General Interventions   General Interventions Discussed/Reviewed General Interventions Discussed, Doctor Visits, Durable Medical Equipment (DME)  Doctor Visits Discussed/Reviewed Doctor Visits Discussed, PCP, Specialist  Durable Medical Equipment (DME) BP Cuff  [pt states he is checking BP daily-instructed pt to  take BP log to MD appts]  PCP/Specialist Visits Compliance with follow-up visit  Education Interventions   Education Provided Provided Education  Provided Verbal Education On Nutrition, When to see the doctor, Medication  Nutrition Interventions   Nutrition Discussed/Reviewed Nutrition Discussed, Adding fruits and vegetables  Pharmacy Interventions   Pharmacy Dicussed/Reviewed Pharmacy Topics Discussed, Medications and their functions  Safety Interventions   Safety Discussed/Reviewed Safety Discussed, Home Safety  Home Safety Assistive Devices       [place], [place] [facility] Care Management Care Management Community Coordinator Direct Phone: [phone] Toll Free: [phone] Fax: [phone]

## 2022-09-30 NOTE — Progress Notes (Unsigned)
[address]       [place] [address]             [phone]      HPI: Collin [name] is an 82 year old male with a past medical history of CAD. The patient returns for routine postoperative follow-up having undergone IABP placement and emergent CABG x 3 utilizing LIMA to LAD, SVG to diagonal and SVG to OM by Dr. Leafy Ro on 09/01/22. The patient's early postoperative recovery while in the hospital was notable for confusion and AKI that both improved with time as well as atrial fibrillation with RVR that chemically converted with Amiodarone. He was discharged in stable condition to CIR on 08/15. He was noted to have orthostatic hypotension, Lasix was discontinued and Lopressor was transitioned to Toprol XL at bedtime.  Since hospital discharge the patient denies chest pain and shortness of breath. He no longer requires narcotic pain medication. He does admit to fatigue that has remained stable but he has been much more active since he returned home. He also admits to dizziness with orthostatic hypotension while in CIR but this has not occurred since he returned home.   Current Outpatient Medications  Medication Sig Dispense Refill   acetaminophen (TYLENOL) 325 MG tablet Take 1-2 tablets (325-650 mg total) by mouth every 4 (four) hours as needed for mild pain.     amiodarone (PACERONE) 200 MG tablet Take 1 tablet (200 mg total) by mouth daily. 30 tablet 0   ascorbic acid (VITAMIN C) 500 MG tablet Take 0.5 tablets (250 mg total) by mouth daily. 100 tablet 0   aspirin 81 MG tablet Take 1 tablet (81 mg total) by mouth at bedtime. (Patient taking differently: Take 81 mg by mouth daily.) 30 tablet    atorvastatin (LIPITOR) 80 MG tablet Take 1 tablet (80 mg total) by mouth daily. 30 tablet 0   clopidogrel (PLAVIX) 75 MG tablet Take 1 tablet (75 mg total) by mouth daily. 30 tablet 0   melatonin 3 MG TABS tablet Take 1 tablet (3 mg total) by mouth at bedtime.     metoprolol succinate  (TOPROL-XL) 25 MG 24 hr tablet Take 0.5 tablets (12.5 mg total) by mouth at bedtime. 15 tablet 0   Multiple Vitamins-Minerals (MENS 50+ MULTIVITAMIN) TABS Take 1 tablet by mouth daily.     nystatin (MYCOSTATIN/NYSTOP) powder Apply topically 2 (two) times daily.     polyethylene glycol (MIRALAX / GLYCOLAX) 17 g packet Take 17 g by mouth 2 (two) times daily.     psyllium (HYDROCIL/METAMUCIL) 95 % PACK Take 1 packet by mouth daily.     senna-docusate (SENOKOT-S) 8.6-50 MG tablet Take 1 tablet by mouth at bedtime.     tamsulosin (FLOMAX) 0.4 MG CAPS capsule TAKE 1 CAPSULE(0.4 MG) BY MOUTH DAILY 90 capsule 2   traZODone (DESYREL) 50 MG tablet Take 0.5 tablets (25 mg total) by mouth at bedtime. 15 tablet 0   No current facility-administered medications for this visit.   Vitals: Today's Vitals   10/05/22 1125  BP: 111/62  Pulse: 70  Resp: 18  SpO2: 92%  Weight: 187 lb (84.8 kg)  Height: 6\' 1"  (1.854 m)   Body mass index is 24.67 kg/m.  Physical Exam: General: Alert and oriented, no acute distress Neuro: Grossly intact CV: Regular rate and rhythm, no murmur Pulm: Clear to auscultation bilaterally GI: +BS, no tenderness Extremities: No edema Wound: Incisions are healing well without sign of infection  Diagnostic Tests: Refer to CXR  Impression/Plan: S/P CABG: The patient is overall progressing very well from CABG surgery. He states he is still fatigued but he has been more active and walking a lot since he returned home. This should continue to improve with time. He is no longer needing a walker for ambulation and denies chest pain and shortness of breath. His CXR shows low lung volumes with improved aeration and decreased bibasilar atelectasis since hospital discharge. His orthostatic hypotension has improved since Lasix was discontinued and BP medication was transitioned to Toprol XL at bedtime. I will not make any medication changes at this time. He will follow up with cardiology next  week. His incisions are healing well without sign of infection. I cleared him to drive today and cleared him to start cardiac rehab which he is looking forward to. He may gradually increase the amount he lifts as tolerated but no heavy lifting until 3 months postoperative. We will plan to have him return to the clinic on an as needed basis.    Jenny Reichmann, PA-C [facility] [phone]

## 2022-10-04 ENCOUNTER — Ambulatory Visit: Payer: Self-pay

## 2022-10-05 ENCOUNTER — Ambulatory Visit (INDEPENDENT_AMBULATORY_CARE_PROVIDER_SITE_OTHER): Payer: Self-pay | Admitting: Physician Assistant

## 2022-10-05 ENCOUNTER — Ambulatory Visit
Admission: RE | Admit: 2022-10-05 | Discharge: 2022-10-05 | Disposition: A | Discharge status: Home / Self Care | Payer: PPO | Source: Ambulatory Visit | Attending: Thoracic Surgery (Cardiothoracic Vascular Surgery) | Admitting: Thoracic Surgery (Cardiothoracic Vascular Surgery)

## 2022-10-05 ENCOUNTER — Encounter: Payer: Self-pay | Admitting: Physician Assistant

## 2022-10-05 VITALS — BP 111/62 | HR 70 | Resp 18 | Ht 73.0 in | Wt 187.0 lb

## 2022-10-05 DIAGNOSIS — Z951 Presence of aortocoronary bypass graft: Secondary | ICD-10-CM

## 2022-10-05 NOTE — Patient Instructions (Signed)
You are encouraged to enroll and participate in the outpatient cardiac rehab program beginning as soon as practical.  Continue to avoid any heavy lifting or strenuous use of your arms or shoulders for at least a total of three months from the time of surgery.  After three months you may gradually increase how much you lift or otherwise use your arms or chest as tolerated, with limits based upon whether or not activities lead to the return of significant discomfort.  You may return to driving an automobile as long as you are no longer requiring oral narcotic pain relievers during the daytime.  It would be wise to start driving only short distances during the daylight and gradually increase from there as you feel comfortable.

## 2022-10-13 ENCOUNTER — Ambulatory Visit: Payer: PPO | Admitting: Cardiology

## 2022-10-20 ENCOUNTER — Telehealth: Payer: Self-pay | Admitting: Cardiovascular Disease

## 2022-10-20 MED ORDER — CLOPIDOGREL BISULFATE 75 MG PO TABS: 75.0000 mg | ORAL_TABLET | Freq: Every day | ORAL | 3 refills | Status: DC

## 2022-10-20 MED ORDER — AMIODARONE HCL 200 MG PO TABS: 200.0000 mg | ORAL_TABLET | Freq: Every day | ORAL | 3 refills | Status: DC

## 2022-10-20 MED ORDER — ATORVASTATIN CALCIUM 80 MG PO TABS: 80.0000 mg | ORAL_TABLET | Freq: Every day | ORAL | 3 refills | Status: DC

## 2022-10-20 NOTE — Telephone Encounter (Signed)
Patient needs refills on his medications that were started while in the hospital. Patient sees Dr. Excell Seltzer in a few weeks. Will send refills in for patient.

## 2022-10-20 NOTE — Telephone Encounter (Signed)
Pt c/o medication issue:  1. Name of Medication: amiodarone (PACERONE) 200 MG tablet ; atorvastatin (LIPITOR) 80 MG tablet ; clopidogrel (PLAVIX) 75 MG tablet   2. How are you currently taking this medication (dosage and times per day)?   Take 1 tablet (200 mg total) by mouth daily. Take 1 tablet (80 mg total) by mouth daily. Take 1 tablet (75 mg total) by mouth daily.  3. Are you having a reaction (difficulty breathing--STAT)? No   4. What is your medication issue? Patient says that he has a few pills of each one left and wants to know if he should refill them or not

## 2022-10-25 ENCOUNTER — Other Ambulatory Visit: Payer: Self-pay

## 2022-10-27 ENCOUNTER — Telehealth: Payer: Self-pay | Admitting: Cardiovascular Disease

## 2022-10-27 NOTE — Telephone Encounter (Signed)
Spoke with pt who reports BP yesterday was 101/60 and 91/53.  This morning 160/89 and 126/90.  Pt with known orthostatic hypotension.  Pt denies any current CP, SOB or dizziness.  Pt is concerned regarding BP fluctuations.  RN discussed normal BP parameters with pt and potential symptoms with low and high blood pressures.  Encouraged pt to keep a log of daily BP's taken at 10am and 8pm after pt has rested for 10-15 minutes.  Pt will bring to his 11/05/2022 appointment with Dr Excell Seltzer or contact the office sooner if readings are consistently abnormal or if he develops symptoms.  Pt thanked Charity fundraiser for the call.

## 2022-10-27 NOTE — Telephone Encounter (Signed)
Pt c/o BP issue: STAT if pt c/o blurred vision, one-sided weakness or slurred speech  1. What are your last 5 BP readings? 162/90  2. Are you having any other symptoms (ex. Dizziness, headache, blurred vision, passed out)? No  3. What is your BP issue? It was 91/53 yesterday now today it's 162/90 so pt is concerned and would like to speak with nurse about it and his medications. Please advise

## 2022-11-01 ENCOUNTER — Telehealth: Payer: Self-pay | Admitting: Cardiovascular Disease

## 2022-11-01 NOTE — Telephone Encounter (Signed)
Returned call to patient who states that he feels an intermittent dull ache that started three days ago. Episodes are lasting 'about a minute' when they occur and happening two to three times daily. He denies any other associated symptoms-specifically, no SOB, arm pain/jaw pain/ dizziness. He states he has tried walking since his CABG procedure at his normal stride and "about a quarter of a mile" but felt very fatigued and didn't know if he was safe to do so. Advised him that that level of activity should be just fine. He asks about cardiac rehab, but wants to speak more to [place] about this at his upcoming appt on Friday. Advised that if nature of these sensations change at all prior to visit, to let us know.

## 2022-11-01 NOTE — Telephone Encounter (Signed)
Pt c/o of Chest Pain: STAT if CP now or developed within 24 hours  1. Are you having CP right now?  No, patient denies pain.   2. Are you experiencing any other symptoms (ex. SOB, nausea, vomiting, sweating)?  No   3. How long have you been experiencing CP?  Starting Saturday. Patient denies pain/tightness. He states it just feels like a little "?blip"  4. Is your CP continuous or coming and going?  Coming   5. Have you taken Nitroglycerin?  No  ?

## 2022-11-05 ENCOUNTER — Encounter: Payer: Self-pay | Admitting: Cardiovascular Disease

## 2022-11-05 ENCOUNTER — Ambulatory Visit: Payer: PPO | Attending: Cardiology | Admitting: Cardiovascular Disease

## 2022-11-05 VITALS — BP 98/60 | HR 62 | Ht 73.0 in | Wt 192.6 lb

## 2022-11-05 DIAGNOSIS — I10 Essential (primary) hypertension: Secondary | ICD-10-CM

## 2022-11-05 DIAGNOSIS — I2510 Atherosclerotic heart disease of native coronary artery without angina pectoris: Secondary | ICD-10-CM

## 2022-11-05 DIAGNOSIS — E782 Mixed hyperlipidemia: Secondary | ICD-10-CM

## 2022-11-05 NOTE — Patient Instructions (Signed)
Medication Instructions:  STOP Amiodarone *If you need a refill on your cardiac medications before your next appointment, please call your pharmacy*  Testing/Procedures: Ambulatory referral to cardiac rehabilitation  Follow-Up: At [facility], you and your health needs are our priority.  As part of our continuing mission to provide you with exceptional heart care, we have created designated Provider Care Teams.  These Care Teams include your primary Cardiologist (physician) and Advanced Practice Providers (APPs -  Physician Assistants and Nurse Practitioners) who all work together to provide you with the care you need, when you need it.  Your next appointment:   3 month(s)  Provider:   Tonny Bollman, MD

## 2022-11-05 NOTE — Progress Notes (Signed)
stenosed.   The left ventricular systolic function is normal.   LV end diastolic pressure is mildly elevated.   The left ventricular ejection fraction is 50-55% by visual estimate.   There is no aortic valve stenosis.   Recommend Aspirin post CABG.  Add Plavix when safe from a bleeding standpoint.  Acute inferior MI due to severe ostial circumflex and distal left main stenosis.  Severe mid LAD disease and ostial second diagonal disease.  Patent RCA.  Given the anatomy, will plan for emergent bypass surgery.  IABP was placed in the right femoral artery.  The patient was also started on IV nitroglycerin.  Dr. Leafy Ro has evaluated the patient and  will be taking the patient to the OR shortly.  Findings Coronary Findings Diagnostic  Dominance: Right  Left Main Mid LM to Dist LM lesion is 90% stenosed.  Left Anterior Descending Mid LAD lesion is 80% stenosed.  First Diagonal Branch 1st Diag lesion is 50% stenosed.  Second Diagonal Branch 2nd Diag lesion is 90% stenosed.  Left Circumflex Ost Cx to Prox Cx lesion is 99% stenosed.  Right Coronary Artery Vessel is large.  Intervention  No interventions have been documented.   STRESS TESTS  MYOCARDIAL PERFUSION IMAGING 11/29/1996    TEE  ECHO INTRAOPERATIVE TEE 09/01/2022  Narrative *INTRAOPERATIVE TRANSESOPHAGEAL REPORT *    Patient Name:   Collin [name] Date of Exam: 09/01/2022 Medical Rec #:  [id number]        Height:       73.0 in Accession #:    [id number]       Weight:       196.2 lb Date of Birth:  Jan 29, 1940        BSA:          2.13 m Patient Age:    82 years         BP:           119/43 mmHg Patient Gender: M                HR:           66 bpm. Exam Location:  Anesthesiology  Transesophogeal exam was perform intraoperatively during surgical procedure. Patient was closely monitored under general anesthesia during the entirety of examination.  Indications:     I20.0 Unstable angina Performing Phys: [id number] Eugenio Hoes  Complications: No known complications during this procedure. POST-OP IMPRESSIONS _ Left Ventricle: has normal systolic function, with an ejection fraction of 65%. The cavity size was normal. The wall motion is normal. _ Right Ventricle: The right ventricle appears unchanged from pre-bypass normal function. _ Aorta: The aorta appears unchanged from pre-bypassIntra-aortic balloon pump present in descending aorta. _ Left Atrial Appendage: The left atrial appendage appears unchanged from pre-bypass. _ Mitral Valve: There is mild regurgitation. _ Tricuspid Valve: There is trivial regurgitation. _ Pulmonic Valve: The pulmonic valve  appears unchanged from pre-bypass. _ Interatrial Septum: The interatrial septum appears unchanged from pre-bypass. _ Pericardium: The pericardium appears unchanged from pre-bypass. _ Comments: Post-bypass images reviewed with surgeon.  PRE-OP FINDINGS Left Ventricle: The left ventricle has normal systolic function, with an ejection fraction of 60-65%. The cavity size was normal. There is no left ventricular hypertrophy.   Right Ventricle: The right ventricle has normal systolic function. The cavity was normal. There is no increase in right ventricular wall thickness.  Left Atrium: Left atrial size was normal in size. No left atrial/left atrial appendage thrombus was detected.  Right  Cardiology Office Note:    Date:  11/05/2022   ID:  Collin [name], DOB 09/22/1940, MRN [id number]  PCP:  [place], Betty G, MD   [facility] Providers Cardiologist:  Tonny Bollman, MD     Referring MD: [place], Betty G, MD   Chief Complaint  Patient presents with   Coronary Artery Disease    History of Present Illness:    Collin [name] is a 82 y.o. male with a hx of:  Coronary artery disease  S/p remote inferior MI in 1998 tx with POBA to RCA  Mid LAD disease tx medically (beta-blocker) Cath 9/13: mLAD 80; oD1 80, pD2 80; mLCx 50, OM1 50-60; mRCA 30 NSTEMI 08.2024 - severe left main disease - emergent CABG LIMA-LAD, SVG-diagonal, SVG-OM Lumbar disc dz S/p L5-S1 microdiscectomy 12/19 Hypertension  Hyperlipidemia  BPH  The patient presents for cardiology follow-up after undergoing emergency CABG in August 2024 when he presented with non-STEMI.  His postoperative course was complicated by confusion and acute kidney injury as well as atrial fibrillation with RVR.  He has had cardiac surgery follow-up and has been cleared to resume driving.  He has also been cleared to start cardiac rehab.  The patient is here with his wife and daughter today.  He has been a little frustrated about his slow recovery from surgery.  He continues to feel fatigued.  He reports shortness of breath with walking, but he is able to walk for about 8 minutes now.  He is slowly improving.  He has had no heart palpitations.  He states that food just does not "taste right."  He denies chest pain or pressure with exertion.  He has some fleeting pains around the sternum.  No orthopnea or PND.  No lightheadedness or presyncope.  He has had some labile blood pressures with readings under 100 but not associated with any symptoms, then occasionally systolic readings in the 150s.  Current Medications: Current Meds  Medication Sig   ascorbic acid (VITAMIN C) 500 MG tablet Take 0.5 tablets (250 mg total)  by mouth daily.   aspirin 81 MG tablet Take 1 tablet (81 mg total) by mouth at bedtime. (Patient taking differently: Take 81 mg by mouth daily.)   atorvastatin (LIPITOR) 80 MG tablet Take 1 tablet (80 mg total) by mouth daily.   clopidogrel (PLAVIX) 75 MG tablet Take 1 tablet (75 mg total) by mouth daily.   metoprolol succinate (TOPROL-XL) 25 MG 24 hr tablet Take 0.5 tablets (12.5 mg total) by mouth at bedtime.   Multiple Vitamins-Minerals (MENS 50+ MULTIVITAMIN) TABS Take 1 tablet by mouth daily.   tamsulosin (FLOMAX) 0.4 MG CAPS capsule TAKE 1 CAPSULE(0.4 MG) BY MOUTH DAILY   traZODone (DESYREL) 50 MG tablet Take 0.5 tablets (25 mg total) by mouth at bedtime.   [DISCONTINUED] amiodarone (PACERONE) 200 MG tablet Take 1 tablet (200 mg total) by mouth daily.     Allergies:   Penicillins   ROS:   Please see the history of present illness.    All other systems reviewed and are negative.  EKGs/Labs/Other Studies Reviewed:    The following studies were reviewed today: Cardiac Studies & Procedures   CARDIAC CATHETERIZATION  CARDIAC CATHETERIZATION 09/01/2022  Narrative   Mid LM to Dist LM lesion is 90% stenosed.   Ost Cx to Prox Cx lesion is 99% stenosed.   1st Diag lesion is 50% stenosed.   2nd Diag lesion is 90% stenosed.   Mid LAD lesion is 80%  stenosed.   The left ventricular systolic function is normal.   LV end diastolic pressure is mildly elevated.   The left ventricular ejection fraction is 50-55% by visual estimate.   There is no aortic valve stenosis.   Recommend Aspirin post CABG.  Add Plavix when safe from a bleeding standpoint.  Acute inferior MI due to severe ostial circumflex and distal left main stenosis.  Severe mid LAD disease and ostial second diagonal disease.  Patent RCA.  Given the anatomy, will plan for emergent bypass surgery.  IABP was placed in the right femoral artery.  The patient was also started on IV nitroglycerin.  Dr. Leafy Ro has evaluated the patient and  will be taking the patient to the OR shortly.  Findings Coronary Findings Diagnostic  Dominance: Right  Left Main Mid LM to Dist LM lesion is 90% stenosed.  Left Anterior Descending Mid LAD lesion is 80% stenosed.  First Diagonal Branch 1st Diag lesion is 50% stenosed.  Second Diagonal Branch 2nd Diag lesion is 90% stenosed.  Left Circumflex Ost Cx to Prox Cx lesion is 99% stenosed.  Right Coronary Artery Vessel is large.  Intervention  No interventions have been documented.   STRESS TESTS  MYOCARDIAL PERFUSION IMAGING 11/29/1996    TEE  ECHO INTRAOPERATIVE TEE 09/01/2022  Narrative *INTRAOPERATIVE TRANSESOPHAGEAL REPORT *    Patient Name:   Collin [name] Date of Exam: 09/01/2022 Medical Rec #:  [id number]        Height:       73.0 in Accession #:    [id number]       Weight:       196.2 lb Date of Birth:  Jan 29, 1940        BSA:          2.13 m Patient Age:    82 years         BP:           119/43 mmHg Patient Gender: M                HR:           66 bpm. Exam Location:  Anesthesiology  Transesophogeal exam was perform intraoperatively during surgical procedure. Patient was closely monitored under general anesthesia during the entirety of examination.  Indications:     I20.0 Unstable angina Performing Phys: [id number] Eugenio Hoes  Complications: No known complications during this procedure. POST-OP IMPRESSIONS _ Left Ventricle: has normal systolic function, with an ejection fraction of 65%. The cavity size was normal. The wall motion is normal. _ Right Ventricle: The right ventricle appears unchanged from pre-bypass normal function. _ Aorta: The aorta appears unchanged from pre-bypassIntra-aortic balloon pump present in descending aorta. _ Left Atrial Appendage: The left atrial appendage appears unchanged from pre-bypass. _ Mitral Valve: There is mild regurgitation. _ Tricuspid Valve: There is trivial regurgitation. _ Pulmonic Valve: The pulmonic valve  appears unchanged from pre-bypass. _ Interatrial Septum: The interatrial septum appears unchanged from pre-bypass. _ Pericardium: The pericardium appears unchanged from pre-bypass. _ Comments: Post-bypass images reviewed with surgeon.  PRE-OP FINDINGS Left Ventricle: The left ventricle has normal systolic function, with an ejection fraction of 60-65%. The cavity size was normal. There is no left ventricular hypertrophy.   Right Ventricle: The right ventricle has normal systolic function. The cavity was normal. There is no increase in right ventricular wall thickness.  Left Atrium: Left atrial size was normal in size. No left atrial/left atrial appendage thrombus was detected.  Right  stenosed.   The left ventricular systolic function is normal.   LV end diastolic pressure is mildly elevated.   The left ventricular ejection fraction is 50-55% by visual estimate.   There is no aortic valve stenosis.   Recommend Aspirin post CABG.  Add Plavix when safe from a bleeding standpoint.  Acute inferior MI due to severe ostial circumflex and distal left main stenosis.  Severe mid LAD disease and ostial second diagonal disease.  Patent RCA.  Given the anatomy, will plan for emergent bypass surgery.  IABP was placed in the right femoral artery.  The patient was also started on IV nitroglycerin.  Dr. Leafy Ro has evaluated the patient and  will be taking the patient to the OR shortly.  Findings Coronary Findings Diagnostic  Dominance: Right  Left Main Mid LM to Dist LM lesion is 90% stenosed.  Left Anterior Descending Mid LAD lesion is 80% stenosed.  First Diagonal Branch 1st Diag lesion is 50% stenosed.  Second Diagonal Branch 2nd Diag lesion is 90% stenosed.  Left Circumflex Ost Cx to Prox Cx lesion is 99% stenosed.  Right Coronary Artery Vessel is large.  Intervention  No interventions have been documented.   STRESS TESTS  MYOCARDIAL PERFUSION IMAGING 11/29/1996    TEE  ECHO INTRAOPERATIVE TEE 09/01/2022  Narrative *INTRAOPERATIVE TRANSESOPHAGEAL REPORT *    Patient Name:   Collin [name] Date of Exam: 09/01/2022 Medical Rec #:  [id number]        Height:       73.0 in Accession #:    [id number]       Weight:       196.2 lb Date of Birth:  Jan 29, 1940        BSA:          2.13 m Patient Age:    82 years         BP:           119/43 mmHg Patient Gender: M                HR:           66 bpm. Exam Location:  Anesthesiology  Transesophogeal exam was perform intraoperatively during surgical procedure. Patient was closely monitored under general anesthesia during the entirety of examination.  Indications:     I20.0 Unstable angina Performing Phys: [id number] Eugenio Hoes  Complications: No known complications during this procedure. POST-OP IMPRESSIONS _ Left Ventricle: has normal systolic function, with an ejection fraction of 65%. The cavity size was normal. The wall motion is normal. _ Right Ventricle: The right ventricle appears unchanged from pre-bypass normal function. _ Aorta: The aorta appears unchanged from pre-bypassIntra-aortic balloon pump present in descending aorta. _ Left Atrial Appendage: The left atrial appendage appears unchanged from pre-bypass. _ Mitral Valve: There is mild regurgitation. _ Tricuspid Valve: There is trivial regurgitation. _ Pulmonic Valve: The pulmonic valve  appears unchanged from pre-bypass. _ Interatrial Septum: The interatrial septum appears unchanged from pre-bypass. _ Pericardium: The pericardium appears unchanged from pre-bypass. _ Comments: Post-bypass images reviewed with surgeon.  PRE-OP FINDINGS Left Ventricle: The left ventricle has normal systolic function, with an ejection fraction of 60-65%. The cavity size was normal. There is no left ventricular hypertrophy.   Right Ventricle: The right ventricle has normal systolic function. The cavity was normal. There is no increase in right ventricular wall thickness.  Left Atrium: Left atrial size was normal in size. No left atrial/left atrial appendage thrombus was detected.  Right

## 2022-11-08 ENCOUNTER — Encounter (HOSPITAL_COMMUNITY): Payer: Self-pay

## 2022-11-11 ENCOUNTER — Encounter (HOSPITAL_COMMUNITY)
Admission: RE | Admit: 2022-11-11 | Discharge: 2022-11-11 | Disposition: A | Discharge status: Home / Self Care | Payer: PPO | Source: Ambulatory Visit | Attending: Cardiovascular Disease | Admitting: Cardiovascular Disease

## 2022-11-11 VITALS — BP 124/60 | HR 61 | Ht 74.0 in | Wt 192.2 lb

## 2022-11-11 DIAGNOSIS — Z48812 Encounter for surgical aftercare following surgery on the circulatory system: Secondary | ICD-10-CM | POA: Diagnosis not present

## 2022-11-11 DIAGNOSIS — Z951 Presence of aortocoronary bypass graft: Secondary | ICD-10-CM | POA: Insufficient documentation

## 2022-11-11 DIAGNOSIS — I213 ST elevation (STEMI) myocardial infarction of unspecified site: Secondary | ICD-10-CM

## 2022-11-11 NOTE — Progress Notes (Signed)
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 (Patient taking differently: Take 81 mg by mouth daily.), Disp: 30 tablet, Rfl:    atorvastatin (LIPITOR) 80 MG tablet, Take 1 tablet (80 mg total) by mouth daily., Disp: 90 tablet, Rfl: 3   clopidogrel (PLAVIX) 75 MG tablet, Take 1 tablet (75 mg total) by mouth daily., Disp: 90 tablet, Rfl: 3   metoprolol succinate (TOPROL-XL) 25 MG 24 hr tablet, Take 0.5 tablets (12.5 mg total) by mouth at bedtime., Disp: 15 tablet, Rfl: 0   Multiple Vitamins-Minerals (MENS 50+ MULTIVITAMIN) TABS, Take 1 tablet by mouth daily., Disp: , Rfl:    tamsulosin (FLOMAX) 0.4 MG CAPS capsule, TAKE 1 CAPSULE(0.4 MG) BY MOUTH DAILY, Disp: 90 capsule, Rfl: 2   traZODone (DESYREL) 50 MG tablet, Take 0.5 tablets (25 mg total) by mouth at bedtime., Disp: 15 tablet, Rfl: 0  Past Medical History: Past Medical History:  Diagnosis Date   Allergy    Arthritis    BPH (benign prostatic hyperplasia)    CAD (coronary artery disease)    Cataract    Diverticulosis of colon    Hx of adenomatous colonic polyps 05/22/2014   Hyperlipidemia    Hypertension    MYOCARDIAL INFARCTION, HX OF 07/20/2006   Qualifier:  Diagnosis of  By: Cato Mulligan MD, Bruce      Tobacco Use: Social History   Tobacco Use  Smoking Status Never  Smokeless Tobacco Never    Labs: Review Flowsheet  More data exists      Latest Ref Rng & Units 07/15/2021 01/05/2022 09/01/2022 09/02/2022 09/03/2022  Labs for ITP Cardiac and Pulmonary Rehab  Cholestrol 0 - 200 mg/dL - - 409  - 83   LDL (calc) 0 - 99 mg/dL - - 62  - 31   HDL-C >81 mg/dL - - 59  - 43   Trlycerides <150 mg/dL - - 59  - 47   Hemoglobin A1c 4.8 - 5.6 % 6.3  5.7  6.0  - -  PH, Arterial 7.35 - 7.45 - - 7.388  7.370  7.317  7.355  7.316  7.370  -  PCO2 arterial 32 - 48 mmHg - - 36.1  38.7  45.6  34.6  41.3  31.6  -  Bicarbonate 20.0 - 28.0 mmol/L - - 22.0  23.4  22.4  23.3  19.4  21.2  18.5  -  TCO2 22 - 32 mmol/L - - 23  24  24  25  24  24  27  25  20  20  22  19   -  Acid-base deficit 0.0 - 2.0 mmol/L - - 3.0  2.0  3.0  3.0  6.0  5.0  6.0  -  O2 Saturation % - - 98  83  100  100  98  95  99  -    Details       Multiple values from one day are sorted in reverse-chronological order         Capillary Blood Glucose: Lab Results  Component Value Date   GLUCAP 118 (H) 09/10/2022   GLUCAP 111 (H) 09/10/2022   GLUCAP 132 (H) 09/09/2022   GLUCAP 100 (H) 09/09/2022   GLUCAP 123 (H) 09/05/2022     Exercise Target Goals: Exercise Program Goal: Individual exercise prescription set using results from initial 6 min walk test and THRR while considering  patient's activity barriers and safety.   Exercise Prescription Goal: Initial exercise prescription builds to 30-45 minutes a day of aerobic activity, 2-3 days per week.  Home exercise guidelines will be given to patient during program as part of exercise prescription that the participant will acknowledge.  Activity Barriers & Risk Stratification:  Activity Barriers & Cardiac Risk Stratification - 11/11/22 1132       Activity Barriers & Cardiac Risk Stratification   Activity Barriers Balance  Concerns;Arthritis;Joint Problems;Back Problems;Deconditioning;Assistive Device;Other (comment);Muscular Weakness;Neck/Spine Problems   Pt has drop foot (left)   Cardiac Risk Stratification High             6 Minute Walk:  6 Minute Walk     Row Name 11/11/22 1109         6 Minute Walk   Phase Initial  Pt used go-cart     Distance 999 feet  Pt used Go-cart     Walk Time 6 minutes     # of Rest Breaks 0     MPH 1.9     METS 2     RPE 9     Perceived Dyspnea  0     VO2 Peak 6.9     Symptoms No     Resting HR 59 bpm     Resting BP 124/60     Resting Oxygen Saturation  96 %     Exercise Oxygen Saturation  during 6 min walk 97 %     Max Ex. HR 102 bpm     Max Ex. BP 134/70     2 Minute Post BP 114/66              Oxygen Initial Assessment:   Oxygen Re-Evaluation:   Oxygen Discharge (Final Oxygen Re-Evaluation):   Initial Exercise Prescription:  Initial Exercise Prescription - 11/11/22 1100       Date of Initial Exercise RX and Referring Provider   Date 11/11/22    Referring Provider Tonny Bollman, MD    Expected Discharge Date 02/02/23      NuStep   Level 1    SPM 70    Minutes 25    METs 2      Prescription Details   Frequency (times per week) 3    Duration Progress to 30 minutes of continuous aerobic without signs/symptoms of physical distress      Intensity   THRR 40-80% of Max Heartrate 54-109    Ratings of Perceived Exertion 11-13    Perceived Dyspnea 0-4      Progression   Progression Continue progressive overload as per policy without signs/symptoms or physical distress.      Resistance Training   Training Prescription Yes    Weight 3 lbs    Reps 10-15  Perform Capillary Blood Glucose checks as needed.  Exercise Prescription Changes:   Exercise Comments:   Exercise Goals and Review:   Exercise Goals     Row Name 11/11/22 0923             Exercise Goals   Increase Physical Activity Yes        Intervention Provide advice, education, support and counseling about physical activity/exercise needs.;Develop an individualized exercise prescription for aerobic and resistive training based on initial evaluation findings, risk stratification, comorbidities and participant's personal goals.       Expected Outcomes Short Term: Attend rehab on a regular basis to increase amount of physical activity.;Long Term: Exercising regularly at least 3-5 days a week.;Long Term: Add in home exercise to make exercise part of routine and to increase amount of physical activity.       Increase Strength and Stamina Yes       Intervention Provide advice, education, support and counseling about physical activity/exercise needs.;Develop an individualized exercise prescription for aerobic and resistive training based on initial evaluation findings, risk stratification, comorbidities and participant's personal goals.       Expected Outcomes Short Term: Increase workloads from initial exercise prescription for resistance, speed, and METs.;Short Term: Perform resistance training exercises routinely during rehab and add in resistance training at home;Long Term: Improve cardiorespiratory fitness, muscular endurance and strength as measured by increased METs and functional capacity ( )       Able to understand and use rate of perceived exertion (RPE) scale Yes       Intervention Provide education and explanation on how to use RPE scale       Expected Outcomes Long Term:  Able to use RPE to guide intensity level when exercising independently;Short Term: Able to use RPE daily in rehab to express subjective intensity level       Knowledge and understanding of Target Heart Rate Range (THRR) Yes       Intervention Provide education and explanation of THRR including how the numbers were predicted and where they are located for reference       Expected Outcomes Short Term: Able to state/look up THRR;Short Term: Able to use daily as  guideline for intensity in rehab;Long Term: Able to use THRR to govern intensity when exercising independently       Understanding of Exercise Prescription Yes       Intervention Provide education, explanation, and written materials on patient's individual exercise prescription       Expected Outcomes Short Term: Able to explain program exercise prescription;Long Term: Able to explain home exercise prescription to exercise independently                Exercise Goals Re-Evaluation :   Discharge Exercise Prescription (Final Exercise Prescription Changes):   Nutrition:  Target Goals: Understanding of nutrition guidelines, daily intake of sodium 1500mg , cholesterol 200mg , calories 30% from fat and 7% or less from saturated fats, daily to have 5 or more servings of fruits and vegetables.  Biometrics:   Post Biometrics - 11/11/22 0955        Post  Biometrics   Waist Circumference 40 inches    Hip Circumference 40 inches    Waist to Hip Ratio 1 %    Triceps Skinfold 10 mm    Grip Strength 38 kg    Flexibility --   Not performed due to back problems   Single Leg Stand --   Pt voiced he could not do it  Nutrition Therapy Plan and Nutrition Goals:   Nutrition Assessments:  MEDIFICTS Score Key: >=70 Need to make dietary changes  40-70 Heart Healthy Diet <= 40 Therapeutic Level Cholesterol Diet    Picture Your Plate Scores: <84 Unhealthy dietary pattern with much room for improvement. 41-50 Dietary pattern unlikely to meet recommendations for good health and room for improvement. 51-60 More healthful dietary pattern, with some room for improvement.  >60 Healthy dietary pattern, although there may be some specific behaviors that could be improved.    Nutrition Goals Re-Evaluation:   Nutrition Goals Re-Evaluation:   Nutrition Goals Discharge (Final Nutrition Goals Re-Evaluation):   Psychosocial: Target Goals: Acknowledge presence or absence of  significant depression and/or stress, maximize coping skills, provide positive support system. Participant is able to verbalize types and ability to use techniques and skills needed for reducing stress and depression.  Initial Review & Psychosocial Screening:  Initial Psych Review & Screening - 11/11/22 1014       Initial Review   Current issues with None Identified      Family Dynamics   Good Support System? Yes   Pt has spouse Olegario Messier, and a daughter ans son her in [place] for support.     Barriers   Psychosocial barriers to participate in program There are no identifiable barriers or psychosocial needs.      Screening Interventions   Interventions Encouraged to exercise             Quality of Life Scores:  Quality of Life - 11/11/22 1232       Quality of Life   Select Quality of Life      Quality of Life Scores   Health/Function Pre 19.37 %    Socioeconomic Pre 22.57 %    Psych/Spiritual Pre 22.07 %    Family Pre 26.4 %    GLOBAL Pre 21.62 %            Scores of 19 and below usually indicate a poorer quality of life in these areas.  A difference of  2-3 points is a clinically meaningful difference.  A difference of 2-3 points in the total score of the Quality of Life Index has been associated with significant improvement in overall quality of life, self-image, physical symptoms, and general health in studies assessing change in quality of life.  PHQ-9: Review Flowsheet  More data exists      11/11/2022 03/04/2022 01/05/2022 07/15/2021 03/03/2021  Depression screen PHQ 2/9  Decreased Interest 0 0 0 0 0  Down, Depressed, Hopeless 0 0 0 0 0  PHQ - 2 Score 0 0 0 0 0  Altered sleeping 1 0 - - -  Tired, decreased energy 1 0 - - -  Change in appetite 0 0 - - -  Feeling bad or failure about yourself  0 0 - - -  Trouble concentrating 0 1 - - -  Moving slowly or fidgety/restless 1 0 - - -  Suicidal thoughts 0 0 - - -  PHQ-9 Score 3 1 - - -  Difficult doing work/chores Not  difficult at all Not difficult at all - - -    Details           Interpretation of Total Score  Total Score Depression Severity:  1-4 = Minimal depression, 5-9 = Mild depression, 10-14 = Moderate depression, 15-19 = Moderately severe depression, 20-27 = Severe depression   Psychosocial Evaluation and Intervention:   Psychosocial Re-Evaluation:   Psychosocial Discharge (Final  Psychosocial Re-Evaluation):   Vocational Rehabilitation: Provide vocational rehab assistance to qualifying candidates.   Vocational Rehab Evaluation & Intervention:  Vocational Rehab - 11/11/22 1015       Initial Vocational Rehab Evaluation & Intervention   Assessment shows need for Vocational Rehabilitation No   Pt is retired            Education: Education Goals: Education classes will be provided on a weekly basis, covering required topics. Participant will state understanding/return demonstration of topics presented.     Core Videos: Exercise    Move It!  Clinical staff conducted group or individual video education with verbal and written material and guidebook.  Patient learns the recommended Pritikin exercise program. Exercise with the goal of living a long, healthy life. Some of the health benefits of exercise include controlled diabetes, healthier blood pressure levels, improved cholesterol levels, improved heart and lung capacity, improved sleep, and better body composition. Everyone should speak with their doctor before starting or changing an exercise routine.  Biomechanical Limitations Clinical staff conducted group or individual video education with verbal and written material and guidebook.  Patient learns how biomechanical limitations can impact exercise and how we can mitigate and possibly overcome limitations to have an impactful and balanced exercise routine.  Body Composition Clinical staff conducted group or individual video education with verbal and written material and  guidebook.  Patient learns that body composition (ratio of muscle mass to fat mass) is a key component to assessing overall fitness, rather than body weight alone. Increased fat mass, especially visceral belly fat, can put [place] at increased risk for metabolic syndrome, type 2 diabetes, heart disease, and even death. It is recommended to combine diet and exercise (cardiovascular and resistance training) to improve your body composition. Seek guidance from your physician and exercise physiologist before implementing an exercise routine.  Exercise Action Plan Clinical staff conducted group or individual video education with verbal and written material and guidebook.  Patient learns the recommended strategies to achieve and enjoy long-term exercise adherence, including variety, self-motivation, self-efficacy, and positive decision making. Benefits of exercise include fitness, good health, weight management, more energy, better sleep, less stress, and overall well-being.  Medical   Heart Disease Risk Reduction Clinical staff conducted group or individual video education with verbal and written material and guidebook.  Patient learns our heart is our most vital organ as it circulates oxygen, nutrients, white blood cells, and hormones throughout the entire body, and carries waste away. Data supports a plant-based eating plan like the Pritikin Program for its effectiveness in slowing progression of and reversing heart disease. The video provides a number of recommendations to address heart disease.   Metabolic Syndrome and Belly Fat  Clinical staff conducted group or individual video education with verbal and written material and guidebook.  Patient learns what metabolic syndrome is, how it leads to heart disease, and how one can reverse it and keep it from coming back. You have metabolic syndrome if you have 3 of the following 5 criteria: abdominal obesity, high blood pressure, high triglycerides, low HDL  cholesterol, and high blood sugar.  Hypertension and Heart Disease Clinical staff conducted group or individual video education with verbal and written material and guidebook.  Patient learns that high blood pressure, or hypertension, is very common in the [place]. Hypertension is largely due to excessive salt intake, but other important risk factors include being overweight, physical inactivity, drinking too much alcohol, smoking, and not eating enough potassium from fruits and  vegetables. High blood pressure is a leading risk factor for heart attack, stroke, congestive heart failure, dementia, kidney failure, and premature death. Long-term effects of excessive salt intake include stiffening of the arteries and thickening of heart muscle and organ damage. Recommendations include ways to reduce hypertension and the risk of heart disease.  Diseases of Our Time - Focusing on Diabetes Clinical staff conducted group or individual video education with verbal and written material and guidebook.  Patient learns why the best way to stop diseases of our time is prevention, through food and other lifestyle changes. Medicine (such as prescription pills and surgeries) is often only a Band-Aid on the problem, not a long-term solution. Most common diseases of our time include obesity, type 2 diabetes, hypertension, heart disease, and cancer. The Pritikin Program is recommended and has been proven to help reduce, reverse, and/or prevent the damaging effects of metabolic syndrome.  Nutrition   Overview of the Pritikin Eating Plan  Clinical staff conducted group or individual video education with verbal and written material and guidebook.  Patient learns about the Pritikin Eating Plan for disease risk reduction. The Pritikin Eating Plan emphasizes a wide variety of unrefined, minimally-processed carbohydrates, like fruits, vegetables, whole grains, and legumes. Go, Caution, and Stop food choices are explained.  Plant-based and lean animal proteins are emphasized. Rationale provided for low sodium intake for blood pressure control, low added sugars for blood sugar stabilization, and low added fats and oils for coronary artery disease risk reduction and weight management.  Calorie Density  Clinical staff conducted group or individual video education with verbal and written material and guidebook.  Patient learns about calorie density and how it impacts the Pritikin Eating Plan. Knowing the characteristics of the food you choose will help you decide whether those foods will lead to weight gain or weight loss, and whether you want to consume more or less of them. Weight loss is usually a side effect of the Pritikin Eating Plan because of its focus on low calorie-dense foods.  Label Reading  Clinical staff conducted group or individual video education with verbal and written material and guidebook.  Patient learns about the Pritikin recommended label reading guidelines and corresponding recommendations regarding calorie density, added sugars, sodium content, and whole grains.  Dining Out - Part 1  Clinical staff conducted group or individual video education with verbal and written material and guidebook.  Patient learns that restaurant meals can be sabotaging because they can be so high in calories, fat, sodium, and/or sugar. Patient learns recommended strategies on how to positively address this and avoid unhealthy pitfalls.  Facts on Fats  Clinical staff conducted group or individual video education with verbal and written material and guidebook.  Patient learns that lifestyle modifications can be just as effective, if not more so, as many medications for lowering your risk of heart disease. A Pritikin lifestyle can help to reduce your risk of inflammation and atherosclerosis (cholesterol build-up, or plaque, in the artery walls). Lifestyle interventions such as dietary choices and physical activity address  the cause of atherosclerosis. A review of the types of fats and their impact on blood cholesterol levels, along with dietary recommendations to reduce fat intake is also included.  Nutrition Action Plan  Clinical staff conducted group or individual video education with verbal and written material and guidebook.  Patient learns how to incorporate Pritikin recommendations into their lifestyle. Recommendations include planning and keeping personal health goals in mind as an important part of their success.  Healthy Mind-Set    Healthy Minds, Bodies, Hearts  Clinical staff conducted group or individual video education with verbal and written material and guidebook.  Patient learns how to identify when they are stressed. Video will discuss the impact of that stress, as well as the many benefits of stress management. Patient will also be introduced to stress management techniques. The way we think, act, and feel has an impact on our hearts.  How Our Thoughts Can Heal Our Hearts  Clinical staff conducted group or individual video education with verbal and written material and guidebook.  Patient learns that negative thoughts can cause depression and anxiety. This can result in negative lifestyle behavior and serious health problems. Cognitive behavioral therapy is an effective method to help control our thoughts in order to change and improve our emotional outlook.  Additional Videos:  Exercise    Improving Performance  Clinical staff conducted group or individual video education with verbal and written material and guidebook.  Patient learns to use a non-linear approach by alternating intensity levels and lengths of time spent exercising to help burn more calories and lose more body fat. Cardiovascular exercise helps improve heart health, metabolism, hormonal balance, blood sugar control, and recovery from fatigue. Resistance training improves strength, endurance, balance, coordination, reaction time,  metabolism, and muscle mass. Flexibility exercise improves circulation, posture, and balance. Seek guidance from your physician and exercise physiologist before implementing an exercise routine and learn your capabilities and proper form for all exercise.  Introduction to Yoga  Clinical staff conducted group or individual video education with verbal and written material and guidebook.  Patient learns about yoga, a discipline of the coming together of mind, breath, and body. The benefits of yoga include improved flexibility, improved range of motion, better posture and core strength, increased lung function, weight loss, and positive self-image. Yoga's heart health benefits include lowered blood pressure, healthier heart rate, decreased cholesterol and triglyceride levels, improved immune function, and reduced stress. Seek guidance from your physician and exercise physiologist before implementing an exercise routine and learn your capabilities and proper form for all exercise.  Medical   Aging: Enhancing Your Quality of Life  Clinical staff conducted group or individual video education with verbal and written material and guidebook.  Patient learns key strategies and recommendations to stay in good physical health and enhance quality of life, such as prevention strategies, having an advocate, securing a Health Care Proxy and Power of Attorney, and keeping a list of medications and system for tracking them. It also discusses how to avoid risk for bone loss.  Biology of Weight Control  Clinical staff conducted group or individual video education with verbal and written material and guidebook.  Patient learns that weight gain occurs because we consume more calories than we burn (eating more, moving less). Even if your body weight is normal, you may have higher ratios of fat compared to muscle mass. Too much body fat puts you at increased risk for cardiovascular disease, heart attack, stroke, type 2  diabetes, and obesity-related cancers. In addition to exercise, following the Pritikin Eating Plan can help reduce your risk.  Decoding Lab Results  Clinical staff conducted group or individual video education with verbal and written material and guidebook.  Patient learns that lab test reflects one measurement whose values change over time and are influenced by many factors, including medication, stress, sleep, exercise, food, hydration, pre-existing medical conditions, and more. It is recommended to use the knowledge from this video to become more  involved with your lab results and evaluate your numbers to speak with your doctor.   Diseases of Our Time - Overview  Clinical staff conducted group or individual video education with verbal and written material and guidebook.  Patient learns that according to the CDC, 50% to 70% of chronic diseases (such as obesity, type 2 diabetes, elevated lipids, hypertension, and heart disease) are avoidable through lifestyle improvements including healthier food choices, listening to satiety cues, and increased physical activity.  Sleep Disorders Clinical staff conducted group or individual video education with verbal and written material and guidebook.  Patient learns how good quality and duration of sleep are important to overall health and well-being. Patient also learns about sleep disorders and how they impact health along with recommendations to address them, including discussing with a physician.  Nutrition  Dining Out - Part 2 Clinical staff conducted group or individual video education with verbal and written material and guidebook.  Patient learns how to plan ahead and communicate in order to maximize their dining experience in a healthy and nutritious manner. Included are recommended food choices based on the type of restaurant the patient is visiting.   Fueling a Banker conducted group or individual video education with verbal  and written material and guidebook.  There is a strong connection between our food choices and our health. Diseases like obesity and type 2 diabetes are very prevalent and are in large-part due to lifestyle choices. The Pritikin Eating Plan provides plenty of food and hunger-curbing satisfaction. It is easy to follow, affordable, and helps reduce health risks.  Menu Workshop  Clinical staff conducted group or individual video education with verbal and written material and guidebook.  Patient learns that restaurant meals can sabotage health goals because they are often packed with calories, fat, sodium, and sugar. Recommendations include strategies to plan ahead and to communicate with the manager, chef, or server to help order a healthier meal.  Planning Your Eating Strategy  Clinical staff conducted group or individual video education with verbal and written material and guidebook.  Patient learns about the Pritikin Eating Plan and its benefit of reducing the risk of disease. The Pritikin Eating Plan does not focus on calories. Instead, it emphasizes high-quality, nutrient-rich foods. By knowing the characteristics of the foods, we choose, we can determine their calorie density and make informed decisions.  Targeting Your Nutrition Priorities  Clinical staff conducted group or individual video education with verbal and written material and guidebook.  Patient learns that lifestyle habits have a tremendous impact on disease risk and progression. This video provides eating and physical activity recommendations based on your personal health goals, such as reducing LDL cholesterol, losing weight, preventing or controlling type 2 diabetes, and reducing high blood pressure.  Vitamins and Minerals  Clinical staff conducted group or individual video education with verbal and written material and guidebook.  Patient learns different ways to obtain key vitamins and minerals, including through a recommended  healthy diet. It is important to discuss all supplements you take with your doctor.   Healthy Mind-Set    Smoking Cessation  Clinical staff conducted group or individual video education with verbal and written material and guidebook.  Patient learns that cigarette smoking and tobacco addiction pose a serious health risk which affects millions of people. Stopping smoking will significantly reduce the risk of heart disease, lung disease, and many forms of cancer. Recommended strategies for quitting are covered, including working with your doctor to develop a  successful plan.  Culinary   Becoming a Set designer conducted group or individual video education with verbal and written material and guidebook.  Patient learns that cooking at home can be healthy, cost-effective, quick, and puts them in control. Keys to cooking healthy recipes will include looking at your recipe, assessing your equipment needs, planning ahead, making it simple, choosing cost-effective seasonal ingredients, and limiting the use of added fats, salts, and sugars.  Cooking - Breakfast and Snacks  Clinical staff conducted group or individual video education with verbal and written material and guidebook.  Patient learns how important breakfast is to satiety and nutrition through the entire day. Recommendations include key foods to eat during breakfast to help stabilize blood sugar levels and to prevent overeating at meals later in the day. Planning ahead is also a key component.  Cooking - Educational psychologist conducted group or individual video education with verbal and written material and guidebook.  Patient learns eating strategies to improve overall health, including an approach to cook more at home. Recommendations include thinking of animal protein as a side on your plate rather than center stage and focusing instead on lower calorie dense options like vegetables, fruits, whole grains, and  plant-based proteins, such as beans. Making sauces in large quantities to freeze for later and leaving the skin on your vegetables are also recommended to maximize your experience.  Cooking - Healthy Salads and Dressing Clinical staff conducted group or individual video education with verbal and written material and guidebook.  Patient learns that vegetables, fruits, whole grains, and legumes are the foundations of the Pritikin Eating Plan. Recommendations include how to incorporate each of these in flavorful and healthy salads, and how to create homemade salad dressings. Proper handling of ingredients is also covered. Cooking - Soups and [employer] - Soups and Desserts Clinical staff conducted group or individual video education with verbal and written material and guidebook.  Patient learns that Pritikin soups and desserts make for easy, nutritious, and delicious snacks and meal components that are low in sodium, fat, sugar, and calorie density, while high in vitamins, minerals, and filling fiber. Recommendations include simple and healthy ideas for soups and desserts.   Overview     The Pritikin Solution Program Overview Clinical staff conducted group or individual video education with verbal and written material and guidebook.  Patient learns that the results of the Pritikin Program have been documented in more than 100 articles published in peer-reviewed journals, and the benefits include reducing risk factors for (and, in some cases, even reversing) high cholesterol, high blood pressure, type 2 diabetes, obesity, and more! An overview of the three key pillars of the Pritikin Program will be covered: eating well, doing regular exercise, and having a healthy mind-set.  WORKSHOPS  Exercise: Exercise Basics: Building Your Action Plan Clinical staff led group instruction and group discussion with PowerPoint presentation and patient guidebook. To enhance the learning environment the use of  posters, models and videos may be added. At the conclusion of this workshop, patients will comprehend the difference between physical activity and exercise, as well as the benefits of incorporating both, into their routine. Patients will understand the FITT (Frequency, Intensity, Time, and Type) principle and how to use it to build an exercise action plan. In addition, safety concerns and other considerations for exercise and cardiac rehab will be addressed by the presenter. The purpose of this lesson is to promote a comprehensive and effective weekly  exercise routine in order to improve patients' overall level of fitness.   Managing Heart Disease: Your Path to a Healthier Heart Clinical staff led group instruction and group discussion with PowerPoint presentation and patient guidebook. To enhance the learning environment the use of posters, models and videos may be added.At the conclusion of this workshop, patients will understand the anatomy and physiology of the heart. Additionally, they will understand how Pritikin's three pillars impact the risk factors, the progression, and the management of heart disease.  The purpose of this lesson is to provide a high-level overview of the heart, heart disease, and how the Pritikin lifestyle positively impacts risk factors.  Exercise Biomechanics Clinical staff led group instruction and group discussion with PowerPoint presentation and patient guidebook. To enhance the learning environment the use of posters, models and videos may be added. Patients will learn how the structural parts of their bodies function and how these functions impact their daily activities, movement, and exercise. Patients will learn how to promote a neutral spine, learn how to manage pain, and identify ways to improve their physical movement in order to promote healthy living. The purpose of this lesson is to expose patients to common physical limitations that impact physical  activity. Participants will learn practical ways to adapt and manage aches and pains, and to minimize their effect on regular exercise. Patients will learn how to maintain good posture while sitting, walking, and lifting.  Balance Training and Fall Prevention  Clinical staff led group instruction and group discussion with PowerPoint presentation and patient guidebook. To enhance the learning environment the use of posters, models and videos may be added. At the conclusion of this workshop, patients will understand the importance of their sensorimotor skills (vision, proprioception, and the vestibular system) in maintaining their ability to balance as they age. Patients will apply a variety of balancing exercises that are appropriate for their current level of function. Patients will understand the common causes for poor balance, possible solutions to these problems, and ways to modify their physical environment in order to minimize their fall risk. The purpose of this lesson is to teach patients about the importance of maintaining balance as they age and ways to minimize their risk of falling.  WORKSHOPS   Nutrition:  Fueling a Ship broker led group instruction and group discussion with PowerPoint presentation and patient guidebook. To enhance the learning environment the use of posters, models and videos may be added. Patients will review the foundational principles of the Pritikin Eating Plan and understand what constitutes a serving size in each of the food groups. Patients will also learn Pritikin-friendly foods that are better choices when away from home and review make-ahead meal and snack options. Calorie density will be reviewed and applied to three nutrition priorities: weight maintenance, weight loss, and weight gain. The purpose of this lesson is to reinforce (in a group setting) the key concepts around what patients are recommended to eat and how to apply these guidelines  when away from home by planning and selecting Pritikin-friendly options. Patients will understand how calorie density may be adjusted for different weight management goals.  Mindful Eating  Clinical staff led group instruction and group discussion with PowerPoint presentation and patient guidebook. To enhance the learning environment the use of posters, models and videos may be added. Patients will briefly review the concepts of the Pritikin Eating Plan and the importance of low-calorie dense foods. The concept of mindful eating will be introduced as well as the  importance of paying attention to internal hunger signals. Triggers for non-hunger eating and techniques for dealing with triggers will be explored. The purpose of this lesson is to provide patients with the opportunity to review the basic principles of the Pritikin Eating Plan, discuss the value of eating mindfully and how to measure internal cues of hunger and fullness using the Hunger Scale. Patients will also discuss reasons for non-hunger eating and learn strategies to use for controlling emotional eating.  Targeting Your Nutrition Priorities Clinical staff led group instruction and group discussion with PowerPoint presentation and patient guidebook. To enhance the learning environment the use of posters, models and videos may be added. Patients will learn how to determine their genetic susceptibility to disease by reviewing their family history. Patients will gain insight into the importance of diet as part of an overall healthy lifestyle in mitigating the impact of genetics and other environmental insults. The purpose of this lesson is to provide patients with the opportunity to assess their personal nutrition priorities by looking at their family history, their own health history and current risk factors. Patients will also be able to discuss ways of prioritizing and modifying the Pritikin Eating Plan for their highest risk areas  Menu   Clinical staff led group instruction and group discussion with PowerPoint presentation and patient guidebook. To enhance the learning environment the use of posters, models and videos may be added. Using menus brought in from [employer], or printed from [employer], patients will apply the Pritikin dining out guidelines that were presented in the [employer] video. Patients will also be able to practice these guidelines in a variety of provided scenarios. The purpose of this lesson is to provide patients with the opportunity to practice hands-on learning of the Pritikin Dining Out guidelines with actual menus and practice scenarios.  Label Reading Clinical staff led group instruction and group discussion with PowerPoint presentation and patient guidebook. To enhance the learning environment the use of posters, models and videos may be added. Patients will review and discuss the Pritikin label reading guidelines presented in Pritikin's Label Reading Educational series video. Using fool labels brought in from local grocery stores and markets, patients will apply the label reading guidelines and determine if the packaged food meet the Pritikin guidelines. The purpose of this lesson is to provide patients with the opportunity to review, discuss, and practice hands-on learning of the Pritikin Label Reading guidelines with actual packaged food labels. Cooking School  Pritikin's [employer] are designed to teach patients ways to prepare quick, simple, and affordable recipes at home. The importance of nutrition's role in chronic disease risk reduction is reflected in its emphasis in the overall Pritikin program. By learning how to prepare essential core Pritikin Eating Plan recipes, patients will increase control over what they eat; be able to customize the flavor of foods without the use of added salt, sugar, or fat; and improve the quality of the food they consume. By  learning a set of core recipes which are easily assembled, quickly prepared, and affordable, patients are more likely to prepare more healthy foods at home. These workshops focus on convenient breakfasts, simple entres, side dishes, and desserts which can be prepared with minimal effort and are consistent with nutrition recommendations for cardiovascular risk reduction. Cooking [employer] are taught by a Armed forces logistics/support/administrative officer (RD) who has been trained by the [employer]. The chef or RD has a clear understanding of the importance of  minimizing - if not completely eliminating - added fat, sugar, and sodium in recipes. Throughout the series of Cooking School Workshop sessions, patients will learn about healthy ingredients and efficient methods of cooking to build confidence in their capability to prepare    Cooking School weekly topics:  Adding Flavor- Sodium-Free  Fast and Healthy Breakfasts  Powerhouse Plant-Based Proteins  Satisfying Salads and Dressings  Simple Sides and Sauces  International Cuisine-Spotlight on the [employer] Zones  Delicious Desserts  Savory Soups  [employer] - Meals in a Astronomer Appetizers and Snacks  Comforting Weekend Breakfasts  One-Pot Wonders   Fast Evening Meals  Landscape architect Your Pritikin Plate  WORKSHOPS   Healthy Mindset (Psychosocial):  Focused Goals, Sustainable Changes Clinical staff led group instruction and group discussion with PowerPoint presentation and patient guidebook. To enhance the learning environment the use of posters, models and videos may be added. Patients will be able to apply effective goal setting strategies to establish at least one personal goal, and then take consistent, meaningful action toward that goal. They will learn to identify common barriers to achieving personal goals and develop strategies to overcome them. Patients will also gain an understanding of how our mind-set can  impact our ability to achieve goals and the importance of cultivating a positive and growth-oriented mind-set. The purpose of this lesson is to provide patients with a deeper understanding of how to set and achieve personal goals, as well as the tools and strategies needed to overcome common obstacles which may arise along the way.  From Head to Heart: The Power of a Healthy Outlook  Clinical staff led group instruction and group discussion with PowerPoint presentation and patient guidebook. To enhance the learning environment the use of posters, models and videos may be added. Patients will be able to recognize and describe the impact of emotions and mood on physical health. They will discover the importance of self-care and explore self-care practices which may work for them. Patients will also learn how to utilize the 4 C's to cultivate a healthier outlook and better manage stress and challenges. The purpose of this lesson is to demonstrate to patients how a healthy outlook is an essential part of maintaining good health, especially as they continue their cardiac rehab journey.  Healthy Sleep for a Healthy Heart Clinical staff led group instruction and group discussion with PowerPoint presentation and patient guidebook. To enhance the learning environment the use of posters, models and videos may be added. At the conclusion of this workshop, patients will be able to demonstrate knowledge of the importance of sleep to overall health, well-being, and quality of life. They will understand the symptoms of, and treatments for, common sleep disorders. Patients will also be able to identify daytime and nighttime behaviors which impact sleep, and they will be able to apply these tools to help manage sleep-related challenges. The purpose of this lesson is to provide patients with a general overview of sleep and outline the importance of quality sleep. Patients will learn about a few of the most common sleep  disorders. Patients will also be introduced to the concept of "sleep hygiene," and discover ways to self-manage certain sleeping problems through simple daily behavior changes. Finally, the workshop will motivate patients by clarifying the links between quality sleep and their goals of heart-healthy living.   Recognizing and Reducing Stress Clinical staff led group instruction and group discussion with PowerPoint presentation and patient guidebook. To enhance the learning environment the use  of posters, models and videos may be added. At the conclusion of this workshop, patients will be able to understand the types of stress reactions, differentiate between acute and chronic stress, and recognize the impact that chronic stress has on their health. They will also be able to apply different coping mechanisms, such as reframing negative self-talk. Patients will have the opportunity to practice a variety of stress management techniques, such as deep abdominal breathing, progressive muscle relaxation, and/or guided imagery.  The purpose of this lesson is to educate patients on the role of stress in their lives and to provide healthy techniques for coping with it.  Learning Barriers/Preferences:  Learning Barriers/Preferences - 11/11/22 1147       Learning Barriers/Preferences   Learning Barriers Sight;Hearing   Wears reading glosses and bilateral hearing aids   Learning Preferences Video;Pictoral;Computer/Internet             Education Topics:  Knowledge Questionnaire Score:  Knowledge Questionnaire Score - 11/11/22 1233       Knowledge Questionnaire Score   Pre Score 20/24             Core Components/Risk Factors/Patient Goals at Admission:  Personal Goals and Risk Factors at Admission - 11/11/22 1147       Core Components/Risk Factors/Patient Goals on Admission   Hypertension Yes    Intervention Provide education on lifestyle modifcations including regular physical  activity/exercise, weight management, moderate sodium restriction and increased consumption of fresh fruit, vegetables, and low fat dairy, alcohol moderation, and smoking cessation.;Monitor prescription use compliance.    Expected Outcomes Short Term: Continued assessment and intervention until BP is < 140/110mm HG in hypertensive participants. < 130/4mm HG in hypertensive participants with diabetes, heart failure or chronic kidney disease.;Long Term: Maintenance of blood pressure at goal levels.    Lipids Yes    Intervention Provide education and support for participant on nutrition & aerobic/resistive exercise along with prescribed medications to achieve LDL 70mg , HDL >40mg .    Expected Outcomes Short Term: Participant states understanding of desired cholesterol values and is compliant with medications prescribed. Participant is following exercise prescription and nutrition guidelines.;Long Term: Cholesterol controlled with medications as prescribed, with individualized exercise RX and with personalized nutrition plan. Value goals: LDL < 70mg , HDL > 40 mg.             Core Components/Risk Factors/Patient Goals Review:    Core Components/Risk Factors/Patient Goals at Discharge (Final Review):    ITP Comments:  ITP Comments     Row Name 11/11/22 0922           ITP Comments Dr. Armanda Magic medical director. Introduction to pritikin education/intensive cardiac rehab. Initial orientation packet reviewed with patient.                Comments: Participant attended orientation for the cardiac rehabilitation program on  11/11/2022  to perform initial intake and exercise walk test. Patient introduced to the Pritikin Program education and orientation packet was reviewed. Completed 6-minute walk test, measurements, initial ITP, and exercise prescription. Vital signs stable. Telemetry-normal sinus rhythm, 1st degree AVB, asymptomatic.   Service time was from 9:20 to 11:51.

## 2022-11-11 NOTE — Progress Notes (Signed)
Cardiac Rehab Medication Review   Does the patient  feel that his/her medications are working for him/her?  yes  Has the patient been experiencing any side effects to the medications prescribed?  yes  Does the patient measure his/her own blood pressure or blood glucose at home?  yes   Does the patient have any problems obtaining medications due to transportation or finances?   no  Understanding of regimen: fair Understanding of indications: fair Potential of compliance: good    Comments: Pt was unsure about some of the medications that he is taking so he will bring a list or medication bottles to his first exercise visit and the RN will review with him. Pt voices he gets tired and not sure if its for the medication of the surgery, he feels it is from medication. Pt voices he has been checking blood pressure at home daily for the last 2 weeks.     Lorin Picket 11/11/2022 9:47 AM

## 2022-11-15 ENCOUNTER — Encounter (HOSPITAL_COMMUNITY)
Admission: RE | Admit: 2022-11-15 | Discharge: 2022-11-15 | Disposition: A | Discharge status: Home / Self Care | Payer: PPO | Source: Ambulatory Visit | Attending: Cardiovascular Disease | Admitting: Cardiovascular Disease

## 2022-11-15 DIAGNOSIS — I213 ST elevation (STEMI) myocardial infarction of unspecified site: Secondary | ICD-10-CM

## 2022-11-15 DIAGNOSIS — Z951 Presence of aortocoronary bypass graft: Secondary | ICD-10-CM

## 2022-11-15 NOTE — Progress Notes (Signed)
Cardiac Individual Treatment Plan  Patient Details  Name: Collin [name] MRN: [id number] Date of Birth: [birth year] Referring Provider:   Flowsheet Row INTENSIVE CARDIAC REHAB ORIENT from 11/11/2022 in [facility]  Referring Provider Tonny Bollman, MD       Initial Encounter Date:  Flowsheet Row INTENSIVE CARDIAC REHAB ORIENT from 11/11/2022 in [facility]  Date 11/11/22       Visit Diagnosis: 09/01/22 STEMI  09/01/22 CABG x 3  Patient's Home Medications on Admission:  Current Outpatient Medications:    ascorbic acid (VITAMIN C) 500 MG tablet, Take 0.5 tablets (250 mg total) by mouth daily., Disp: 100 tablet, Rfl: 0   aspirin 81 MG tablet, Take 1 tablet (81 mg total) by mouth at bedtime. (Patient taking differently: Take 81 mg by mouth daily.), Disp: 30 tablet, Rfl:    atorvastatin (LIPITOR) 80 MG tablet, Take 1 tablet (80 mg total) by mouth daily., Disp: 90 tablet, Rfl: 3   Cholecalciferol (VITAMIN D-3) 125 MCG (5000 UT) TABS, Take 125 mcg by mouth daily., Disp: , Rfl:    clopidogrel (PLAVIX) 75 MG tablet, Take 1 tablet (75 mg total) by mouth daily., Disp: 90 tablet, Rfl: 3   Coenzyme Q10 (COQ10 PO), Take 300 mg by mouth daily., Disp: , Rfl:    Glucosamine-Chondroitin 500-400 MG CAPS, Take 3 tablets by mouth daily., Disp: , Rfl:    metoprolol succinate (TOPROL-XL) 25 MG 24 hr tablet, Take 0.5 tablets (12.5 mg total) by mouth at bedtime., Disp: 15 tablet, Rfl: 0   Multiple Vitamins-Minerals (MENS 50+ MULTIVITAMIN) TABS, Take 1 tablet by mouth daily., Disp: , Rfl:    Omega-3 Fatty Acids (FISH OIL PO), Take 1,400 mg by mouth daily., Disp: , Rfl:    Probiotic Product (PROBIOTIC PO), Take 1 tablet by mouth daily., Disp: , Rfl:    tamsulosin (FLOMAX) 0.4 MG CAPS capsule, TAKE 1 CAPSULE(0.4 MG) BY MOUTH DAILY, Disp: 90 capsule, Rfl: 2   traZODone (DESYREL) 50 MG tablet,  Take 0.5 tablets (25 mg total) by mouth at bedtime. (Patient not taking: Reported on 11/15/2022), Disp: 15 tablet, Rfl: 0  Past Medical History: Past Medical History:  Diagnosis Date   Allergy    Arthritis    BPH (benign prostatic hyperplasia)    CAD (coronary artery disease)    Cataract    Diverticulosis of colon    Hx of adenomatous colonic polyps 05/22/2014   Hyperlipidemia    Hypertension    MYOCARDIAL INFARCTION, HX OF 07/20/2006   Qualifier: Diagnosis of  By: Cato Mulligan MD, Bruce      Tobacco Use: Social History   Tobacco Use  Smoking Status Never  Smokeless Tobacco Never    Labs: Review Flowsheet  More data exists      Latest Ref Rng & Units 07/15/2021 01/05/2022 09/01/2022 09/02/2022 09/03/2022  Labs for ITP Cardiac and Pulmonary Rehab  Cholestrol 0 - 200 mg/dL - - 478  - 83   LDL (calc) 0 - 99 mg/dL - - 62  - 31   HDL-C >29 mg/dL - - 59  - 43   Trlycerides <150 mg/dL - - 59  - 47   Hemoglobin A1c 4.8 - 5.6 % 6.3  5.7  6.0  - -  PH, Arterial 7.35 - 7.45 - - 7.388  7.370  7.317  7.355  7.316  7.370  -  PCO2 arterial 32 - 48  mmHg - - 36.1  38.7  45.6  34.6  41.3  31.6  -  Bicarbonate 20.0 - 28.0 mmol/L - - 22.0  23.4  22.4  23.3  19.4  21.2  18.5  -  TCO2 22 - 32 mmol/L - - 23  24  24  25  24  24  27  25  20  20  22  19   -  Acid-base deficit 0.0 - 2.0 mmol/L - - 3.0  2.0  3.0  3.0  6.0  5.0  6.0  -  O2 Saturation % - - 98  83  100  100  98  95  99  -    Details       Multiple values from one day are sorted in reverse-chronological order         Capillary Blood Glucose: Lab Results  Component Value Date   GLUCAP 118 (H) 09/10/2022   GLUCAP 111 (H) 09/10/2022   GLUCAP 132 (H) 09/09/2022   GLUCAP 100 (H) 09/09/2022   GLUCAP 123 (H) 09/05/2022     Exercise Target Goals: Exercise Program Goal: Individual exercise prescription set using results from initial 6 min walk test and THRR while considering  patient's activity barriers and safety.   Exercise  Prescription Goal: Initial exercise prescription builds to 30-45 minutes a day of aerobic activity, 2-3 days per week.  Home exercise guidelines will be given to patient during program as part of exercise prescription that the participant will acknowledge.  Activity Barriers & Risk Stratification:  Activity Barriers & Cardiac Risk Stratification - 11/11/22 1132       Activity Barriers & Cardiac Risk Stratification   Activity Barriers Balance Concerns;Arthritis;Joint Problems;Back Problems;Deconditioning;Assistive Device;Other (comment);Muscular Weakness;Neck/Spine Problems   Pt has drop foot (left)   Cardiac Risk Stratification High             6 Minute Walk:  6 Minute Walk     Row Name 11/11/22 1109         6 Minute Walk   Phase Initial  Pt used go-cart     Distance 999 feet  Pt used Go-cart     Walk Time 6 minutes     # of Rest Breaks 0     MPH 1.9     METS 2     RPE 9     Perceived Dyspnea  0     VO2 Peak 6.9     Symptoms No     Resting HR 59 bpm     Resting BP 124/60     Resting Oxygen Saturation  96 %     Exercise Oxygen Saturation  during 6 min walk 97 %     Max Ex. HR 102 bpm     Max Ex. BP 134/70     2 Minute Post BP 114/66              Oxygen Initial Assessment:   Oxygen Re-Evaluation:   Oxygen Discharge (Final Oxygen Re-Evaluation):   Initial Exercise Prescription:  Initial Exercise Prescription - 11/11/22 1100       Date of Initial Exercise RX and Referring Provider   Date 11/11/22    Referring Provider Tonny Bollman, MD    Expected Discharge Date 02/02/23      NuStep   Level 1    SPM 70    Minutes 25    METs 2      Prescription Details   Frequency (times per week)  3    Duration Progress to 30 minutes of continuous aerobic without signs/symptoms of physical distress      Intensity   THRR 40-80% of Max Heartrate 54-109    Ratings of Perceived Exertion 11-13    Perceived Dyspnea 0-4      Progression   Progression Continue  progressive overload as per policy without signs/symptoms or physical distress.      Resistance Training   Training Prescription Yes    Weight 3 lbs    Reps 10-15             Perform Capillary Blood Glucose checks as needed.  Exercise Prescription Changes:   Exercise Prescription Changes     Row Name 11/15/22 1400             Response to Exercise   Blood Pressure (Admit) 118/70       Blood Pressure (Exercise) 128/62       Blood Pressure (Exit) 118/68       Heart Rate (Admit) 65 bpm       Heart Rate (Exercise) 91 bpm       Heart Rate (Exit) 58 bpm       Rating of Perceived Exertion (Exercise) 11       Symptoms None       Comments Pt's first day in the CRP2 program       Duration Continue with 30 min of aerobic exercise without signs/symptoms of physical distress.       Intensity THRR unchanged         Progression   Progression Continue to progress workloads to maintain intensity without signs/symptoms of physical distress.       Average METs 2.4         Resistance Training   Training Prescription Yes       Weight 3 lbs       Reps 10-15       Time 10 Minutes         Interval Training   Interval Training No         NuStep   Level 1       SPM 89       Minutes 25       METs 2.4                Exercise Comments:   Exercise Comments     Row Name 11/15/22 1420           Exercise Comments Pt's first day in the CRP2 program. Pt had no voiced complaints with exercise today.                Exercise Goals and Review:   Exercise Goals     Row Name 11/11/22 0923             Exercise Goals   Increase Physical Activity Yes       Intervention Provide advice, education, support and counseling about physical activity/exercise needs.;Develop an individualized exercise prescription for aerobic and resistive training based on initial evaluation findings, risk stratification, comorbidities and participant's personal goals.       Expected Outcomes  Short Term: Attend rehab on a regular basis to increase amount of physical activity.;Long Term: Exercising regularly at least 3-5 days a week.;Long Term: Add in home exercise to make exercise part of routine and to increase amount of physical activity.       Increase Strength and Stamina Yes       Intervention  Provide advice, education, support and counseling about physical activity/exercise needs.;Develop an individualized exercise prescription for aerobic and resistive training based on initial evaluation findings, risk stratification, comorbidities and participant's personal goals.       Expected Outcomes Short Term: Increase workloads from initial exercise prescription for resistance, speed, and METs.;Short Term: Perform resistance training exercises routinely during rehab and add in resistance training at home;Long Term: Improve cardiorespiratory fitness, muscular endurance and strength as measured by increased METs and functional capacity ( )       Able to understand and use rate of perceived exertion (RPE) scale Yes       Intervention Provide education and explanation on how to use RPE scale       Expected Outcomes Long Term:  Able to use RPE to guide intensity level when exercising independently;Short Term: Able to use RPE daily in rehab to express subjective intensity level       Knowledge and understanding of Target Heart Rate Range (THRR) Yes       Intervention Provide education and explanation of THRR including how the numbers were predicted and where they are located for reference       Expected Outcomes Short Term: Able to state/look up THRR;Short Term: Able to use daily as guideline for intensity in rehab;Long Term: Able to use THRR to govern intensity when exercising independently       Understanding of Exercise Prescription Yes       Intervention Provide education, explanation, and written materials on patient's individual exercise prescription       Expected Outcomes Short Term: Able  to explain program exercise prescription;Long Term: Able to explain home exercise prescription to exercise independently                Exercise Goals Re-Evaluation :  Exercise Goals Re-Evaluation     Row Name 11/15/22 1418             Exercise Goal Re-Evaluation   Exercise Goals Review Increase Physical Activity;Increase Strength and Stamina;Able to understand and use Dyspnea scale;Knowledge and understanding of Target Heart Rate Range (THRR);Understanding of Exercise Prescription       Comments Pt's first day in the CRP2 program. Pt understands the exercise Rx, RPE scale, and THRR.       Expected Outcomes Will monitor patient and progress exercise workloads as tolerated.                Discharge Exercise Prescription (Final Exercise Prescription Changes):  Exercise Prescription Changes - 11/15/22 1400       Response to Exercise   Blood Pressure (Admit) 118/70    Blood Pressure (Exercise) 128/62    Blood Pressure (Exit) 118/68    Heart Rate (Admit) 65 bpm    Heart Rate (Exercise) 91 bpm    Heart Rate (Exit) 58 bpm    Rating of Perceived Exertion (Exercise) 11    Symptoms None    Comments Pt's first day in the CRP2 program    Duration Continue with 30 min of aerobic exercise without signs/symptoms of physical distress.    Intensity THRR unchanged      Progression   Progression Continue to progress workloads to maintain intensity without signs/symptoms of physical distress.    Average METs 2.4      Resistance Training   Training Prescription Yes    Weight 3 lbs    Reps 10-15    Time 10 Minutes      Interval Training   Interval Training No  NuStep   Level 1    SPM 89    Minutes 25    METs 2.4             Nutrition:  Target Goals: Understanding of nutrition guidelines, daily intake of sodium 1500mg , cholesterol 200mg , calories 30% from fat and 7% or less from saturated fats, daily to have 5 or more servings of fruits and  vegetables.  Biometrics:   Post Biometrics - 11/11/22 0955        Post  Biometrics   Waist Circumference 40 inches    Hip Circumference 40 inches    Waist to Hip Ratio 1 %    Triceps Skinfold 10 mm    Grip Strength 38 kg    Flexibility --   Not performed due to back problems   Single Leg Stand --   Pt voiced he could not do it            Nutrition Therapy Plan and Nutrition Goals:  Nutrition Therapy & Goals - 11/15/22 1548       Nutrition Therapy   Diet Heart healthy diet    Drug/Food Interactions Statins/Certain Fruits      Personal Nutrition Goals   Nutrition Goal Patient to identify strategies for reducing cardiovascular risk by attending the Pritikin education and nutrition series weekly.    Personal Goal #2 Patient to improve diet quality by using the plate method as a guide for meal planning to include lean protein/plant protein, fruits, vegetables, whole grains, nonfat dairy as part of a well-balanced diet.    Personal Goal #3 Patient to limit sodium to 1500mg  per day    Comments Patient has medical history of STEMI, CAD, CABGx3, preDM. His LDL remains at goal. His A1c is in a prediabetic range. His wife is a good support. Patient will benefit from participation in intensive cardiac rehab for nutrition, exercise, and lifestyle modification.      Intervention Plan   Intervention Prescribe, educate and counsel regarding individualized specific dietary modifications aiming towards targeted core components such as weight, hypertension, lipid management, diabetes, heart failure and other comorbidities.;Nutrition handout(s) given to patient.    Expected Outcomes Short Term Goal: Understand basic principles of dietary content, such as calories, fat, sodium, cholesterol and nutrients.;Long Term Goal: Adherence to prescribed nutrition plan.             Nutrition Assessments:  MEDIFICTS Score Key: >=70 Need to make dietary changes  40-70 Heart Healthy Diet <= 40  Therapeutic Level Cholesterol Diet    Picture Your Plate Scores: <60 Unhealthy dietary pattern with much room for improvement. 41-50 Dietary pattern unlikely to meet recommendations for good health and room for improvement. 51-60 More healthful dietary pattern, with some room for improvement.  >60 Healthy dietary pattern, although there may be some specific behaviors that could be improved.    Nutrition Goals Re-Evaluation:  Nutrition Goals Re-Evaluation     Row Name 11/15/22 1548             Goals   Current Weight 192 lb 3.9 oz (87.2 kg)       Comment Lpa WNL, LDL at goal, A1c 6.0       Expected Outcome Patient has medical history of STEMI, CAD, CABGx3, preDM. His LDL remains at goal. His A1c is in a prediabetic range. His wife is a good support. Patient will benefit from participation in intensive cardiac rehab for nutrition, exercise, and lifestyle modification.  Nutrition Goals Re-Evaluation:  Nutrition Goals Re-Evaluation     Row Name 11/15/22 1548             Goals   Current Weight 192 lb 3.9 oz (87.2 kg)       Comment Lpa WNL, LDL at goal, A1c 6.0       Expected Outcome Patient has medical history of STEMI, CAD, CABGx3, preDM. His LDL remains at goal. His A1c is in a prediabetic range. His wife is a good support. Patient will benefit from participation in intensive cardiac rehab for nutrition, exercise, and lifestyle modification.                Nutrition Goals Discharge (Final Nutrition Goals Re-Evaluation):  Nutrition Goals Re-Evaluation - 11/15/22 1548       Goals   Current Weight 192 lb 3.9 oz (87.2 kg)    Comment Lpa WNL, LDL at goal, A1c 6.0    Expected Outcome Patient has medical history of STEMI, CAD, CABGx3, preDM. His LDL remains at goal. His A1c is in a prediabetic range. His wife is a good support. Patient will benefit from participation in intensive cardiac rehab for nutrition, exercise, and lifestyle modification.              Psychosocial: Target Goals: Acknowledge presence or absence of significant depression and/or stress, maximize coping skills, provide positive support system. Participant is able to verbalize types and ability to use techniques and skills needed for reducing stress and depression.  Initial Review & Psychosocial Screening:  Initial Psych Review & Screening - 11/11/22 1014       Initial Review   Current issues with None Identified      Family Dynamics   Good Support System? Yes   Pt has spouse Collin [name], and a daughter ans son her in [place] for support.     Barriers   Psychosocial barriers to participate in program There are no identifiable barriers or psychosocial needs.      Screening Interventions   Interventions Encouraged to exercise             Quality of Life Scores:  Quality of Life - 11/11/22 1232       Quality of Life   Select Quality of Life      Quality of Life Scores   Health/Function Pre 19.37 %    Socioeconomic Pre 22.57 %    Psych/Spiritual Pre 22.07 %    Family Pre 26.4 %    GLOBAL Pre 21.62 %            Scores of 19 and below usually indicate a poorer quality of life in these areas.  A difference of  2-3 points is a clinically meaningful difference.  A difference of 2-3 points in the total score of the Quality of Life Index has been associated with significant improvement in overall quality of life, self-image, physical symptoms, and general health in studies assessing change in quality of life.  PHQ-9: Review Flowsheet  More data exists      11/11/2022 03/04/2022 01/05/2022 07/15/2021 03/03/2021  Depression screen PHQ 2/9  Decreased Interest 0 0 0 0 0  Down, Depressed, Hopeless 0 0 0 0 0  PHQ - 2 Score 0 0 0 0 0  Altered sleeping 1 0 - - -  Tired, decreased energy 1 0 - - -  Change in appetite 0 0 - - -  Feeling bad or failure about yourself  0 0 - - -  Trouble concentrating 0 1 - - -  Moving slowly or fidgety/restless 1 0 - - -  Suicidal  thoughts 0 0 - - -  PHQ-9 Score 3 1 - - -  Difficult doing work/chores Not difficult at all Not difficult at all - - -    Details           Interpretation of Total Score  Total Score Depression Severity:  1-4 = Minimal depression, 5-9 = Mild depression, 10-14 = Moderate depression, 15-19 = Moderately severe depression, 20-27 = Severe depression   Psychosocial Evaluation and Intervention:   Psychosocial Re-Evaluation:  Psychosocial Re-Evaluation     Row Name 11/15/22 1658             Psychosocial Re-Evaluation   Current issues with None Identified       Comments Collin [name] did not voice any concerns or stressors on his first day of exercise. Will review PHq2-9 and quality of life questionnaire in the upcoming week       Interventions Stress management education;Encouraged to attend Cardiac Rehabilitation for the exercise;Relaxation education       Continue Psychosocial Services  Follow up required by staff                Psychosocial Discharge (Final Psychosocial Re-Evaluation):  Psychosocial Re-Evaluation - 11/15/22 1658       Psychosocial Re-Evaluation   Current issues with None Identified    Comments Collin [name] did not voice any concerns or stressors on his first day of exercise. Will review PHq2-9 and quality of life questionnaire in the upcoming week    Interventions Stress management education;Encouraged to attend Cardiac Rehabilitation for the exercise;Relaxation education    Continue Psychosocial Services  Follow up required by staff             Vocational Rehabilitation: Provide vocational rehab assistance to qualifying candidates.   Vocational Rehab Evaluation & Intervention:  Vocational Rehab - 11/11/22 1015       Initial Vocational Rehab Evaluation & Intervention   Assessment shows need for Vocational Rehabilitation No   Pt is retired            Education: Education Goals: Education classes will be provided on a weekly basis, covering required  topics. Participant will state understanding/return demonstration of topics presented.    Education     Row Name 11/15/22 1500     Education   Cardiac Education Topics Pritikin   Glass blower/designer Nutrition   Nutrition Workshop Fueling a Forensic psychologist   Instruction Review Code 1- Teaching laboratory technician Start Time 1400   Class Stop Time 1445   Class Time Calculation (min) 45 min            Core Videos: Exercise    Move It!  Clinical staff conducted group or individual video education with verbal and written material and guidebook.  Patient learns the recommended Pritikin exercise program. Exercise with the goal of living a long, healthy life. Some of the health benefits of exercise include controlled diabetes, healthier blood pressure levels, improved cholesterol levels, improved heart and lung capacity, improved sleep, and better body composition. Everyone should speak with their doctor before starting or changing an exercise routine.  Biomechanical Limitations Clinical staff conducted group or individual video education with verbal and written material and guidebook.  Patient learns how biomechanical limitations can impact exercise and how we can mitigate and possibly overcome  limitations to have an impactful and balanced exercise routine.  Body Composition Clinical staff conducted group or individual video education with verbal and written material and guidebook.  Patient learns that body composition (ratio of muscle mass to fat mass) is a key component to assessing overall fitness, rather than body weight alone. Increased fat mass, especially visceral belly fat, can put [place] at increased risk for metabolic syndrome, type 2 diabetes, heart disease, and even death. It is recommended to combine diet and exercise (cardiovascular and resistance training) to improve your body composition. Seek guidance from your physician and  exercise physiologist before implementing an exercise routine.  Exercise Action Plan Clinical staff conducted group or individual video education with verbal and written material and guidebook.  Patient learns the recommended strategies to achieve and enjoy long-term exercise adherence, including variety, self-motivation, self-efficacy, and positive decision making. Benefits of exercise include fitness, good health, weight management, more energy, better sleep, less stress, and overall well-being.  Medical   Heart Disease Risk Reduction Clinical staff conducted group or individual video education with verbal and written material and guidebook.  Patient learns our heart is our most vital organ as it circulates oxygen, nutrients, white blood cells, and hormones throughout the entire body, and carries waste away. Data supports a plant-based eating plan like the Pritikin Program for its effectiveness in slowing progression of and reversing heart disease. The video provides a number of recommendations to address heart disease.   Metabolic Syndrome and Belly Fat  Clinical staff conducted group or individual video education with verbal and written material and guidebook.  Patient learns what metabolic syndrome is, how it leads to heart disease, and how one can reverse it and keep it from coming back. You have metabolic syndrome if you have 3 of the following 5 criteria: abdominal obesity, high blood pressure, high triglycerides, low HDL cholesterol, and high blood sugar.  Hypertension and Heart Disease Clinical staff conducted group or individual video education with verbal and written material and guidebook.  Patient learns that high blood pressure, or hypertension, is very common in the [place]. Hypertension is largely due to excessive salt intake, but other important risk factors include being overweight, physical inactivity, drinking too much alcohol, smoking, and not eating enough potassium from  fruits and vegetables. High blood pressure is a leading risk factor for heart attack, stroke, congestive heart failure, dementia, kidney failure, and premature death. Long-term effects of excessive salt intake include stiffening of the arteries and thickening of heart muscle and organ damage. Recommendations include ways to reduce hypertension and the risk of heart disease.  Diseases of Our Time - Focusing on Diabetes Clinical staff conducted group or individual video education with verbal and written material and guidebook.  Patient learns why the best way to stop diseases of our time is prevention, through food and other lifestyle changes. Medicine (such as prescription pills and surgeries) is often only a Band-Aid on the problem, not a long-term solution. Most common diseases of our time include obesity, type 2 diabetes, hypertension, heart disease, and cancer. The Pritikin Program is recommended and has been proven to help reduce, reverse, and/or prevent the damaging effects of metabolic syndrome.  Nutrition   Overview of the Pritikin Eating Plan  Clinical staff conducted group or individual video education with verbal and written material and guidebook.  Patient learns about the Pritikin Eating Plan for disease risk reduction. The Pritikin Eating Plan emphasizes a wide variety of unrefined, minimally-processed carbohydrates, like fruits, vegetables, whole  grains, and legumes. Go, Caution, and Stop food choices are explained. Plant-based and lean animal proteins are emphasized. Rationale provided for low sodium intake for blood pressure control, low added sugars for blood sugar stabilization, and low added fats and oils for coronary artery disease risk reduction and weight management.  Calorie Density  Clinical staff conducted group or individual video education with verbal and written material and guidebook.  Patient learns about calorie density and how it impacts the Pritikin Eating Plan. Knowing  the characteristics of the food you choose will help you decide whether those foods will lead to weight gain or weight loss, and whether you want to consume more or less of them. Weight loss is usually a side effect of the Pritikin Eating Plan because of its focus on low calorie-dense foods.  Label Reading  Clinical staff conducted group or individual video education with verbal and written material and guidebook.  Patient learns about the Pritikin recommended label reading guidelines and corresponding recommendations regarding calorie density, added sugars, sodium content, and whole grains.  Dining Out - Part 1  Clinical staff conducted group or individual video education with verbal and written material and guidebook.  Patient learns that restaurant meals can be sabotaging because they can be so high in calories, fat, sodium, and/or sugar. Patient learns recommended strategies on how to positively address this and avoid unhealthy pitfalls.  Facts on Fats  Clinical staff conducted group or individual video education with verbal and written material and guidebook.  Patient learns that lifestyle modifications can be just as effective, if not more so, as many medications for lowering your risk of heart disease. A Pritikin lifestyle can help to reduce your risk of inflammation and atherosclerosis (cholesterol build-up, or plaque, in the artery walls). Lifestyle interventions such as dietary choices and physical activity address the cause of atherosclerosis. A review of the types of fats and their impact on blood cholesterol levels, along with dietary recommendations to reduce fat intake is also included.  Nutrition Action Plan  Clinical staff conducted group or individual video education with verbal and written material and guidebook.  Patient learns how to incorporate Pritikin recommendations into their lifestyle. Recommendations include planning and keeping personal health goals in mind as an  important part of their success.  Healthy Mind-Set    Healthy Minds, Bodies, Hearts  Clinical staff conducted group or individual video education with verbal and written material and guidebook.  Patient learns how to identify when they are stressed. Video will discuss the impact of that stress, as well as the many benefits of stress management. Patient will also be introduced to stress management techniques. The way we think, act, and feel has an impact on our hearts.  How Our Thoughts Can Heal Our Hearts  Clinical staff conducted group or individual video education with verbal and written material and guidebook.  Patient learns that negative thoughts can cause depression and anxiety. This can result in negative lifestyle behavior and serious health problems. Cognitive behavioral therapy is an effective method to help control our thoughts in order to change and improve our emotional outlook.  Additional Videos:  Exercise    Improving Performance  Clinical staff conducted group or individual video education with verbal and written material and guidebook.  Patient learns to use a non-linear approach by alternating intensity levels and lengths of time spent exercising to help burn more calories and lose more body fat. Cardiovascular exercise helps improve heart health, metabolism, hormonal balance, blood sugar control, and  recovery from fatigue. Resistance training improves strength, endurance, balance, coordination, reaction time, metabolism, and muscle mass. Flexibility exercise improves circulation, posture, and balance. Seek guidance from your physician and exercise physiologist before implementing an exercise routine and learn your capabilities and proper form for all exercise.  Introduction to Yoga  Clinical staff conducted group or individual video education with verbal and written material and guidebook.  Patient learns about yoga, a discipline of the coming together of mind, breath, and  body. The benefits of yoga include improved flexibility, improved range of motion, better posture and core strength, increased lung function, weight loss, and positive self-image. Yoga's heart health benefits include lowered blood pressure, healthier heart rate, decreased cholesterol and triglyceride levels, improved immune function, and reduced stress. Seek guidance from your physician and exercise physiologist before implementing an exercise routine and learn your capabilities and proper form for all exercise.  Medical   Aging: Enhancing Your Quality of Life  Clinical staff conducted group or individual video education with verbal and written material and guidebook.  Patient learns key strategies and recommendations to stay in good physical health and enhance quality of life, such as prevention strategies, having an advocate, securing a Health Care Proxy and Power of Attorney, and keeping a list of medications and system for tracking them. It also discusses how to avoid risk for bone loss.  Biology of Weight Control  Clinical staff conducted group or individual video education with verbal and written material and guidebook.  Patient learns that weight gain occurs because we consume more calories than we burn (eating more, moving less). Even if your body weight is normal, you may have higher ratios of fat compared to muscle mass. Too much body fat puts you at increased risk for cardiovascular disease, heart attack, stroke, type 2 diabetes, and obesity-related cancers. In addition to exercise, following the Pritikin Eating Plan can help reduce your risk.  Decoding Lab Results  Clinical staff conducted group or individual video education with verbal and written material and guidebook.  Patient learns that lab test reflects one measurement whose values change over time and are influenced by many factors, including medication, stress, sleep, exercise, food, hydration, pre-existing medical conditions, and  more. It is recommended to use the knowledge from this video to become more involved with your lab results and evaluate your numbers to speak with your doctor.   Diseases of Our Time - Overview  Clinical staff conducted group or individual video education with verbal and written material and guidebook.  Patient learns that according to the CDC, 50% to 70% of chronic diseases (such as obesity, type 2 diabetes, elevated lipids, hypertension, and heart disease) are avoidable through lifestyle improvements including healthier food choices, listening to satiety cues, and increased physical activity.  Sleep Disorders Clinical staff conducted group or individual video education with verbal and written material and guidebook.  Patient learns how good quality and duration of sleep are important to overall health and well-being. Patient also learns about sleep disorders and how they impact health along with recommendations to address them, including discussing with a physician.  Nutrition  Dining Out - Part 2 Clinical staff conducted group or individual video education with verbal and written material and guidebook.  Patient learns how to plan ahead and communicate in order to maximize their dining experience in a healthy and nutritious manner. Included are recommended food choices based on the type of restaurant the patient is visiting.   Fueling a Banker conducted  group or individual video education with verbal and written material and guidebook.  There is a strong connection between our food choices and our health. Diseases like obesity and type 2 diabetes are very prevalent and are in large-part due to lifestyle choices. The Pritikin Eating Plan provides plenty of food and hunger-curbing satisfaction. It is easy to follow, affordable, and helps reduce health risks.  Menu Workshop  Clinical staff conducted group or individual video education with verbal and written material and  guidebook.  Patient learns that restaurant meals can sabotage health goals because they are often packed with calories, fat, sodium, and sugar. Recommendations include strategies to plan ahead and to communicate with the manager, chef, or server to help order a healthier meal.  Planning Your Eating Strategy  Clinical staff conducted group or individual video education with verbal and written material and guidebook.  Patient learns about the Pritikin Eating Plan and its benefit of reducing the risk of disease. The Pritikin Eating Plan does not focus on calories. Instead, it emphasizes high-quality, nutrient-rich foods. By knowing the characteristics of the foods, we choose, we can determine their calorie density and make informed decisions.  Targeting Your Nutrition Priorities  Clinical staff conducted group or individual video education with verbal and written material and guidebook.  Patient learns that lifestyle habits have a tremendous impact on disease risk and progression. This video provides eating and physical activity recommendations based on your personal health goals, such as reducing LDL cholesterol, losing weight, preventing or controlling type 2 diabetes, and reducing high blood pressure.  Vitamins and Minerals  Clinical staff conducted group or individual video education with verbal and written material and guidebook.  Patient learns different ways to obtain key vitamins and minerals, including through a recommended healthy diet. It is important to discuss all supplements you take with your doctor.   Healthy Mind-Set    Smoking Cessation  Clinical staff conducted group or individual video education with verbal and written material and guidebook.  Patient learns that cigarette smoking and tobacco addiction pose a serious health risk which affects millions of people. Stopping smoking will significantly reduce the risk of heart disease, lung disease, and many forms of cancer.  Recommended strategies for quitting are covered, including working with your doctor to develop a successful plan.  Culinary   Becoming a Set designer conducted group or individual video education with verbal and written material and guidebook.  Patient learns that cooking at home can be healthy, cost-effective, quick, and puts them in control. Keys to cooking healthy recipes will include looking at your recipe, assessing your equipment needs, planning ahead, making it simple, choosing cost-effective seasonal ingredients, and limiting the use of added fats, salts, and sugars.  Cooking - Breakfast and Snacks  Clinical staff conducted group or individual video education with verbal and written material and guidebook.  Patient learns how important breakfast is to satiety and nutrition through the entire day. Recommendations include key foods to eat during breakfast to help stabilize blood sugar levels and to prevent overeating at meals later in the day. Planning ahead is also a key component.  Cooking - Educational psychologist conducted group or individual video education with verbal and written material and guidebook.  Patient learns eating strategies to improve overall health, including an approach to cook more at home. Recommendations include thinking of animal protein as a side on your plate rather than center stage and focusing instead on lower calorie dense options  like vegetables, fruits, whole grains, and plant-based proteins, such as beans. Making sauces in large quantities to freeze for later and leaving the skin on your vegetables are also recommended to maximize your experience.  Cooking - Healthy Salads and Dressing Clinical staff conducted group or individual video education with verbal and written material and guidebook.  Patient learns that vegetables, fruits, whole grains, and legumes are the foundations of the Pritikin Eating Plan. Recommendations include how  to incorporate each of these in flavorful and healthy salads, and how to create homemade salad dressings. Proper handling of ingredients is also covered. Cooking - Soups and [employer] - Soups and Desserts Clinical staff conducted group or individual video education with verbal and written material and guidebook.  Patient learns that Pritikin soups and desserts make for easy, nutritious, and delicious snacks and meal components that are low in sodium, fat, sugar, and calorie density, while high in vitamins, minerals, and filling fiber. Recommendations include simple and healthy ideas for soups and desserts.   Overview     The Pritikin Solution Program Overview Clinical staff conducted group or individual video education with verbal and written material and guidebook.  Patient learns that the results of the Pritikin Program have been documented in more than 100 articles published in peer-reviewed journals, and the benefits include reducing risk factors for (and, in some cases, even reversing) high cholesterol, high blood pressure, type 2 diabetes, obesity, and more! An overview of the three key pillars of the Pritikin Program will be covered: eating well, doing regular exercise, and having a healthy mind-set.  WORKSHOPS  Exercise: Exercise Basics: Building Your Action Plan Clinical staff led group instruction and group discussion with PowerPoint presentation and patient guidebook. To enhance the learning environment the use of posters, models and videos may be added. At the conclusion of this workshop, patients will comprehend the difference between physical activity and exercise, as well as the benefits of incorporating both, into their routine. Patients will understand the FITT (Frequency, Intensity, Time, and Type) principle and how to use it to build an exercise action plan. In addition, safety concerns and other considerations for exercise and cardiac rehab will be addressed by the  presenter. The purpose of this lesson is to promote a comprehensive and effective weekly exercise routine in order to improve patients' overall level of fitness.   Managing Heart Disease: Your Path to a Healthier Heart Clinical staff led group instruction and group discussion with PowerPoint presentation and patient guidebook. To enhance the learning environment the use of posters, models and videos may be added.At the conclusion of this workshop, patients will understand the anatomy and physiology of the heart. Additionally, they will understand how Pritikin's three pillars impact the risk factors, the progression, and the management of heart disease.  The purpose of this lesson is to provide a high-level overview of the heart, heart disease, and how the Pritikin lifestyle positively impacts risk factors.  Exercise Biomechanics Clinical staff led group instruction and group discussion with PowerPoint presentation and patient guidebook. To enhance the learning environment the use of posters, models and videos may be added. Patients will learn how the structural parts of their bodies function and how these functions impact their daily activities, movement, and exercise. Patients will learn how to promote a neutral spine, learn how to manage pain, and identify ways to improve their physical movement in order to promote healthy living. The purpose of this lesson is to expose patients to common physical limitations that  impact physical activity. Participants will learn practical ways to adapt and manage aches and pains, and to minimize their effect on regular exercise. Patients will learn how to maintain good posture while sitting, walking, and lifting.  Balance Training and Fall Prevention  Clinical staff led group instruction and group discussion with PowerPoint presentation and patient guidebook. To enhance the learning environment the use of posters, models and videos may be added. At the  conclusion of this workshop, patients will understand the importance of their sensorimotor skills (vision, proprioception, and the vestibular system) in maintaining their ability to balance as they age. Patients will apply a variety of balancing exercises that are appropriate for their current level of function. Patients will understand the common causes for poor balance, possible solutions to these problems, and ways to modify their physical environment in order to minimize their fall risk. The purpose of this lesson is to teach patients about the importance of maintaining balance as they age and ways to minimize their risk of falling.  WORKSHOPS   Nutrition:  Fueling a Ship broker led group instruction and group discussion with PowerPoint presentation and patient guidebook. To enhance the learning environment the use of posters, models and videos may be added. Patients will review the foundational principles of the Pritikin Eating Plan and understand what constitutes a serving size in each of the food groups. Patients will also learn Pritikin-friendly foods that are better choices when away from home and review make-ahead meal and snack options. Calorie density will be reviewed and applied to three nutrition priorities: weight maintenance, weight loss, and weight gain. The purpose of this lesson is to reinforce (in a group setting) the key concepts around what patients are recommended to eat and how to apply these guidelines when away from home by planning and selecting Pritikin-friendly options. Patients will understand how calorie density may be adjusted for different weight management goals.  Mindful Eating  Clinical staff led group instruction and group discussion with PowerPoint presentation and patient guidebook. To enhance the learning environment the use of posters, models and videos may be added. Patients will briefly review the concepts of the Pritikin Eating Plan and the  importance of low-calorie dense foods. The concept of mindful eating will be introduced as well as the importance of paying attention to internal hunger signals. Triggers for non-hunger eating and techniques for dealing with triggers will be explored. The purpose of this lesson is to provide patients with the opportunity to review the basic principles of the Pritikin Eating Plan, discuss the value of eating mindfully and how to measure internal cues of hunger and fullness using the Hunger Scale. Patients will also discuss reasons for non-hunger eating and learn strategies to use for controlling emotional eating.  Targeting Your Nutrition Priorities Clinical staff led group instruction and group discussion with PowerPoint presentation and patient guidebook. To enhance the learning environment the use of posters, models and videos may be added. Patients will learn how to determine their genetic susceptibility to disease by reviewing their family history. Patients will gain insight into the importance of diet as part of an overall healthy lifestyle in mitigating the impact of genetics and other environmental insults. The purpose of this lesson is to provide patients with the opportunity to assess their personal nutrition priorities by looking at their family history, their own health history and current risk factors. Patients will also be able to discuss ways of prioritizing and modifying the Pritikin Eating Plan for their highest risk  areas  Menu  Clinical staff led group instruction and group discussion with PowerPoint presentation and patient guidebook. To enhance the learning environment the use of posters, models and videos may be added. Using menus brought in from [employer], or printed from [employer], patients will apply the Pritikin dining out guidelines that were presented in the [employer] video. Patients will also be able to practice these guidelines in a variety of  provided scenarios. The purpose of this lesson is to provide patients with the opportunity to practice hands-on learning of the Pritikin Dining Out guidelines with actual menus and practice scenarios.  Label Reading Clinical staff led group instruction and group discussion with PowerPoint presentation and patient guidebook. To enhance the learning environment the use of posters, models and videos may be added. Patients will review and discuss the Pritikin label reading guidelines presented in Pritikin's Label Reading Educational series video. Using fool labels brought in from local grocery stores and markets, patients will apply the label reading guidelines and determine if the packaged food meet the Pritikin guidelines. The purpose of this lesson is to provide patients with the opportunity to review, discuss, and practice hands-on learning of the Pritikin Label Reading guidelines with actual packaged food labels. Cooking School  Pritikin's [employer] are designed to teach patients ways to prepare quick, simple, and affordable recipes at home. The importance of nutrition's role in chronic disease risk reduction is reflected in its emphasis in the overall Pritikin program. By learning how to prepare essential core Pritikin Eating Plan recipes, patients will increase control over what they eat; be able to customize the flavor of foods without the use of added salt, sugar, or fat; and improve the quality of the food they consume. By learning a set of core recipes which are easily assembled, quickly prepared, and affordable, patients are more likely to prepare more healthy foods at home. These workshops focus on convenient breakfasts, simple entres, side dishes, and desserts which can be prepared with minimal effort and are consistent with nutrition recommendations for cardiovascular risk reduction. Cooking [employer] are taught by a Armed forces logistics/support/administrative officer (RD) who has been trained by the  [employer]. The chef or RD has a clear understanding of the importance of minimizing - if not completely eliminating - added fat, sugar, and sodium in recipes. Throughout the series of Cooking School Workshop sessions, patients will learn about healthy ingredients and efficient methods of cooking to build confidence in their capability to prepare    Cooking School weekly topics:  Adding Flavor- Sodium-Free  Fast and Healthy Breakfasts  Powerhouse Plant-Based Proteins  Satisfying Salads and Dressings  Simple Sides and Sauces  International Cuisine-Spotlight on the [employer] Zones  Delicious Desserts  Savory Soups  [employer] - Meals in a Astronomer Appetizers and Snacks  Comforting Weekend Breakfasts  One-Pot Wonders   Fast Evening Meals  Landscape architect Your Pritikin Plate  WORKSHOPS   Healthy Mindset (Psychosocial):  Focused Goals, Sustainable Changes Clinical staff led group instruction and group discussion with PowerPoint presentation and patient guidebook. To enhance the learning environment the use of posters, models and videos may be added. Patients will be able to apply effective goal setting strategies to establish at least one personal goal, and then take consistent, meaningful action toward that goal. They will learn to identify common barriers to achieving personal goals and develop strategies to overcome them. Patients will also gain an understanding of  how our mind-set can impact our ability to achieve goals and the importance of cultivating a positive and growth-oriented mind-set. The purpose of this lesson is to provide patients with a deeper understanding of how to set and achieve personal goals, as well as the tools and strategies needed to overcome common obstacles which may arise along the way.  From Head to Heart: The Power of a Healthy Outlook  Clinical staff led group instruction and group discussion with PowerPoint presentation  and patient guidebook. To enhance the learning environment the use of posters, models and videos may be added. Patients will be able to recognize and describe the impact of emotions and mood on physical health. They will discover the importance of self-care and explore self-care practices which may work for them. Patients will also learn how to utilize the 4 C's to cultivate a healthier outlook and better manage stress and challenges. The purpose of this lesson is to demonstrate to patients how a healthy outlook is an essential part of maintaining good health, especially as they continue their cardiac rehab journey.  Healthy Sleep for a Healthy Heart Clinical staff led group instruction and group discussion with PowerPoint presentation and patient guidebook. To enhance the learning environment the use of posters, models and videos may be added. At the conclusion of this workshop, patients will be able to demonstrate knowledge of the importance of sleep to overall health, well-being, and quality of life. They will understand the symptoms of, and treatments for, common sleep disorders. Patients will also be able to identify daytime and nighttime behaviors which impact sleep, and they will be able to apply these tools to help manage sleep-related challenges. The purpose of this lesson is to provide patients with a general overview of sleep and outline the importance of quality sleep. Patients will learn about a few of the most common sleep disorders. Patients will also be introduced to the concept of "sleep hygiene," and discover ways to self-manage certain sleeping problems through simple daily behavior changes. Finally, the workshop will motivate patients by clarifying the links between quality sleep and their goals of heart-healthy living.   Recognizing and Reducing Stress Clinical staff led group instruction and group discussion with PowerPoint presentation and patient guidebook. To enhance the learning  environment the use of posters, models and videos may be added. At the conclusion of this workshop, patients will be able to understand the types of stress reactions, differentiate between acute and chronic stress, and recognize the impact that chronic stress has on their health. They will also be able to apply different coping mechanisms, such as reframing negative self-talk. Patients will have the opportunity to practice a variety of stress management techniques, such as deep abdominal breathing, progressive muscle relaxation, and/or guided imagery.  The purpose of this lesson is to educate patients on the role of stress in their lives and to provide healthy techniques for coping with it.  Learning Barriers/Preferences:  Learning Barriers/Preferences - 11/11/22 1147       Learning Barriers/Preferences   Learning Barriers Sight;Hearing   Wears reading glosses and bilateral hearing aids   Learning Preferences Video;Pictoral;Computer/Internet             Education Topics:  Knowledge Questionnaire Score:  Knowledge Questionnaire Score - 11/11/22 1233       Knowledge Questionnaire Score   Pre Score 20/24             Core Components/Risk Factors/Patient Goals at Admission:  Personal Goals and Risk Factors at  Admission - 11/11/22 1147       Core Components/Risk Factors/Patient Goals on Admission   Hypertension Yes    Intervention Provide education on lifestyle modifcations including regular physical activity/exercise, weight management, moderate sodium restriction and increased consumption of fresh fruit, vegetables, and low fat dairy, alcohol moderation, and smoking cessation.;Monitor prescription use compliance.    Expected Outcomes Short Term: Continued assessment and intervention until BP is < 140/42mm HG in hypertensive participants. < 130/25mm HG in hypertensive participants with diabetes, heart failure or chronic kidney disease.;Long Term: Maintenance of blood pressure at goal  levels.    Lipids Yes    Intervention Provide education and support for participant on nutrition & aerobic/resistive exercise along with prescribed medications to achieve LDL 70mg , HDL >40mg .    Expected Outcomes Short Term: Participant states understanding of desired cholesterol values and is compliant with medications prescribed. Participant is following exercise prescription and nutrition guidelines.;Long Term: Cholesterol controlled with medications as prescribed, with individualized exercise RX and with personalized nutrition plan. Value goals: LDL < 70mg , HDL > 40 mg.             Core Components/Risk Factors/Patient Goals Review:   Goals and Risk Factor Review     Row Name 11/15/22 1701             Core Components/Risk Factors/Patient Goals Review   Personal Goals Review Weight Management/Obesity;Hypertension;Lipids       Review Collin [name] started cardiac rehab on 11/15/22. Collin [name] did well with exercise for his fitness level. Collin [name] is noted to be somewhat unsteady. Uses a cane for stability. Vital signs were stable.       Expected Outcomes Collin [name] will continue to participate in cardiac rehab for exercise, nutrition and lifestyle modifications                Core Components/Risk Factors/Patient Goals at Discharge (Final Review):   Goals and Risk Factor Review - 11/15/22 1701       Core Components/Risk Factors/Patient Goals Review   Personal Goals Review Weight Management/Obesity;Hypertension;Lipids    Review Collin [name] started cardiac rehab on 11/15/22. Collin [name] did well with exercise for his fitness level. Collin [name] is noted to be somewhat unsteady. Uses a cane for stability. Vital signs were stable.    Expected Outcomes Collin [name] will continue to participate in cardiac rehab for exercise, nutrition and lifestyle modifications             ITP Comments:  ITP Comments     Row Name 11/11/22 5638 11/15/22 1656         ITP Comments Dr. Armanda Magic medical director. Introduction to  pritikin education/intensive cardiac rehab. Initial orientation packet reviewed with patient. 30 Day ITP Review. Jhavon started cardiac rehab on 11/15/22, "Collin [name]" did well with exercise for his fitness level               Comments: Collin [name]  started cardiac rehab today.  Pt tolerated light exercise without difficulty. VSS, telemetry-Sinus Rhythm, asymptomatic.  Medication list reconciled. Pt denies barriers to medicaiton compliance.  PSYCHOSOCIAL ASSESSMENT:  PHQ-3. . Will review PHq2-9 and quality of life questionnaire in the upcoming week    Pt enjoys watching baseball,football and sports.   Pt oriented to exercise equipment and routine.    Understanding verbalized.Thayer Headings RN BSN

## 2022-11-17 ENCOUNTER — Encounter (HOSPITAL_COMMUNITY)
Admission: RE | Admit: 2022-11-17 | Discharge: 2022-11-17 | Disposition: A | Discharge status: Home / Self Care | Payer: PPO | Source: Ambulatory Visit | Attending: Cardiovascular Disease | Admitting: Cardiovascular Disease

## 2022-11-17 DIAGNOSIS — Z951 Presence of aortocoronary bypass graft: Secondary | ICD-10-CM | POA: Diagnosis not present

## 2022-11-17 DIAGNOSIS — I213 ST elevation (STEMI) myocardial infarction of unspecified site: Secondary | ICD-10-CM

## 2022-11-19 ENCOUNTER — Encounter (HOSPITAL_COMMUNITY)
Admission: RE | Admit: 2022-11-19 | Discharge: 2022-11-19 | Disposition: A | Discharge status: Home / Self Care | Payer: PPO | Source: Ambulatory Visit | Attending: Cardiovascular Disease

## 2022-11-19 DIAGNOSIS — I213 ST elevation (STEMI) myocardial infarction of unspecified site: Secondary | ICD-10-CM

## 2022-11-19 DIAGNOSIS — Z951 Presence of aortocoronary bypass graft: Secondary | ICD-10-CM | POA: Diagnosis not present

## 2022-11-22 ENCOUNTER — Encounter (HOSPITAL_COMMUNITY)
Admission: RE | Admit: 2022-11-22 | Discharge: 2022-11-22 | Disposition: A | Discharge status: Home / Self Care | Payer: PPO | Source: Ambulatory Visit | Attending: Cardiovascular Disease | Admitting: Cardiovascular Disease

## 2022-11-22 DIAGNOSIS — I213 ST elevation (STEMI) myocardial infarction of unspecified site: Secondary | ICD-10-CM

## 2022-11-22 DIAGNOSIS — Z951 Presence of aortocoronary bypass graft: Secondary | ICD-10-CM | POA: Diagnosis not present

## 2022-11-24 ENCOUNTER — Encounter (HOSPITAL_COMMUNITY)
Admission: RE | Admit: 2022-11-24 | Discharge: 2022-11-24 | Disposition: A | Discharge status: Home / Self Care | Payer: PPO | Source: Ambulatory Visit | Attending: Cardiovascular Disease | Admitting: Cardiovascular Disease

## 2022-11-24 DIAGNOSIS — I213 ST elevation (STEMI) myocardial infarction of unspecified site: Secondary | ICD-10-CM

## 2022-11-24 DIAGNOSIS — Z951 Presence of aortocoronary bypass graft: Secondary | ICD-10-CM | POA: Diagnosis not present

## 2022-11-26 ENCOUNTER — Encounter (HOSPITAL_COMMUNITY)
Admission: RE | Admit: 2022-11-26 | Discharge: 2022-11-26 | Disposition: A | Discharge status: Home / Self Care | Payer: PPO | Source: Ambulatory Visit | Attending: Cardiovascular Disease | Admitting: Cardiovascular Disease

## 2022-11-26 DIAGNOSIS — Z951 Presence of aortocoronary bypass graft: Secondary | ICD-10-CM | POA: Insufficient documentation

## 2022-11-26 DIAGNOSIS — I213 ST elevation (STEMI) myocardial infarction of unspecified site: Secondary | ICD-10-CM | POA: Diagnosis not present

## 2022-11-29 ENCOUNTER — Encounter (HOSPITAL_COMMUNITY)
Admission: RE | Admit: 2022-11-29 | Discharge: 2022-11-29 | Disposition: A | Discharge status: Home / Self Care | Payer: PPO | Source: Ambulatory Visit | Attending: Cardiovascular Disease

## 2022-11-29 DIAGNOSIS — Z951 Presence of aortocoronary bypass graft: Secondary | ICD-10-CM

## 2022-11-29 DIAGNOSIS — I213 ST elevation (STEMI) myocardial infarction of unspecified site: Secondary | ICD-10-CM | POA: Diagnosis not present

## 2022-12-01 ENCOUNTER — Encounter (HOSPITAL_COMMUNITY)
Admission: RE | Admit: 2022-12-01 | Discharge: 2022-12-01 | Disposition: A | Discharge status: Home / Self Care | Payer: PPO | Source: Ambulatory Visit | Attending: Cardiovascular Disease | Admitting: Cardiovascular Disease

## 2022-12-01 DIAGNOSIS — I213 ST elevation (STEMI) myocardial infarction of unspecified site: Secondary | ICD-10-CM | POA: Diagnosis not present

## 2022-12-01 DIAGNOSIS — Z951 Presence of aortocoronary bypass graft: Secondary | ICD-10-CM

## 2022-12-03 ENCOUNTER — Encounter (HOSPITAL_COMMUNITY): Payer: PPO

## 2022-12-06 ENCOUNTER — Encounter (HOSPITAL_COMMUNITY): Payer: PPO

## 2022-12-08 ENCOUNTER — Encounter (HOSPITAL_COMMUNITY)
Admission: RE | Admit: 2022-12-08 | Discharge: 2022-12-08 | Disposition: A | Discharge status: Home / Self Care | Payer: PPO | Source: Ambulatory Visit | Attending: Cardiovascular Disease | Admitting: Cardiovascular Disease

## 2022-12-08 DIAGNOSIS — I213 ST elevation (STEMI) myocardial infarction of unspecified site: Secondary | ICD-10-CM

## 2022-12-08 DIAGNOSIS — Z951 Presence of aortocoronary bypass graft: Secondary | ICD-10-CM

## 2022-12-10 ENCOUNTER — Encounter (HOSPITAL_COMMUNITY)
Admission: RE | Admit: 2022-12-10 | Discharge: 2022-12-10 | Disposition: A | Discharge status: Home / Self Care | Payer: PPO | Source: Ambulatory Visit | Attending: Cardiovascular Disease | Admitting: Cardiovascular Disease

## 2022-12-10 DIAGNOSIS — I213 ST elevation (STEMI) myocardial infarction of unspecified site: Secondary | ICD-10-CM

## 2022-12-10 DIAGNOSIS — H40053 Ocular hypertension, bilateral: Secondary | ICD-10-CM | POA: Diagnosis not present

## 2022-12-10 DIAGNOSIS — Z951 Presence of aortocoronary bypass graft: Secondary | ICD-10-CM

## 2022-12-13 ENCOUNTER — Encounter (HOSPITAL_COMMUNITY)
Admission: RE | Admit: 2022-12-13 | Discharge: 2022-12-13 | Disposition: A | Discharge status: Home / Self Care | Payer: PPO | Source: Ambulatory Visit | Attending: Cardiovascular Disease | Admitting: Cardiovascular Disease

## 2022-12-13 DIAGNOSIS — I213 ST elevation (STEMI) myocardial infarction of unspecified site: Secondary | ICD-10-CM

## 2022-12-13 DIAGNOSIS — Z951 Presence of aortocoronary bypass graft: Secondary | ICD-10-CM

## 2022-12-14 NOTE — Progress Notes (Signed)
Cardiac Individual Treatment Plan  Patient Details  Name: Collin [name] MRN: [id number] Date of Birth: Apr 20, 1940 Referring Provider:   Flowsheet Row INTENSIVE CARDIAC REHAB ORIENT from 11/11/2022 in [facility]  Referring Provider Tonny Bollman, MD       Initial Encounter Date:  Flowsheet Row INTENSIVE CARDIAC REHAB ORIENT from 11/11/2022 in [facility] for Heart, Vascular, & Lung Health  Date 11/11/22       Visit Diagnosis: 09/01/22 STEMI  09/01/22 CABG x 3  Patient's Home Medications on Admission:  Current Outpatient Medications:    ascorbic acid (VITAMIN C) 500 MG tablet, Take 0.5 tablets (250 mg total) by mouth daily., Disp: 100 tablet, Rfl: 0   aspirin 81 MG tablet, Take 1 tablet (81 mg total) by mouth at bedtime. (Patient taking differently: Take 81 mg by mouth daily.), Disp: 30 tablet, Rfl:    atorvastatin (LIPITOR) 80 MG tablet, Take 1 tablet (80 mg total) by mouth daily., Disp: 90 tablet, Rfl: 3   Cholecalciferol (VITAMIN D-3) 125 MCG (5000 UT) TABS, Take 125 mcg by mouth daily., Disp: , Rfl:    clopidogrel (PLAVIX) 75 MG tablet, Take 1 tablet (75 mg total) by mouth daily., Disp: 90 tablet, Rfl: 3   Coenzyme Q10 (COQ10 PO), Take 300 mg by mouth daily., Disp: , Rfl:    Glucosamine-Chondroitin 500-400 MG CAPS, Take 3 tablets by mouth daily., Disp: , Rfl:    metoprolol succinate (TOPROL-XL) 25 MG 24 hr tablet, Take 0.5 tablets (12.5 mg total) by mouth at bedtime., Disp: 15 tablet, Rfl: 0   Multiple Vitamins-Minerals (MENS 50+ MULTIVITAMIN) TABS, Take 1 tablet by mouth daily., Disp: , Rfl:    Omega-3 Fatty Acids (FISH OIL PO), Take 1,400 mg by mouth daily., Disp: , Rfl:    Probiotic Product (PROBIOTIC PO), Take 1 tablet by mouth daily., Disp: , Rfl:    tamsulosin (FLOMAX) 0.4 MG CAPS capsule, TAKE 1 CAPSULE(0.4 MG) BY MOUTH DAILY, Disp: 90 capsule, Rfl: 2   traZODone (DESYREL) 50 MG tablet,  Take 0.5 tablets (25 mg total) by mouth at bedtime. (Patient not taking: Reported on 11/15/2022), Disp: 15 tablet, Rfl: 0  Past Medical History: Past Medical History:  Diagnosis Date   Allergy    Arthritis    BPH (benign prostatic hyperplasia)    CAD (coronary artery disease)    Cataract    Diverticulosis of colon    Hx of adenomatous colonic polyps 05/22/2014   Hyperlipidemia    Hypertension    MYOCARDIAL INFARCTION, HX OF 07/20/2006   Qualifier: Diagnosis of  By: Cato Mulligan MD, Bruce      Tobacco Use: Social History   Tobacco Use  Smoking Status Never  Smokeless Tobacco Never    Labs: Review Flowsheet  More data exists      Latest Ref Rng & Units 07/15/2021 01/05/2022 09/01/2022 09/02/2022 09/03/2022  Labs for ITP Cardiac and Pulmonary Rehab  Cholestrol 0 - 200 mg/dL - - 981  - 83   LDL (calc) 0 - 99 mg/dL - - 62  - 31   HDL-C >19 mg/dL - - 59  - 43   Trlycerides <150 mg/dL - - 59  - 47   Hemoglobin A1c 4.8 - 5.6 % 6.3  5.7  6.0  - -  PH, Arterial 7.35 - 7.45 - - 7.388  7.370  7.317  7.355  7.316  7.370  -  PCO2 arterial 32 - 48  mmHg - - 36.1  38.7  45.6  34.6  41.3  31.6  -  Bicarbonate 20.0 - 28.0 mmol/L - - 22.0  23.4  22.4  23.3  19.4  21.2  18.5  -  TCO2 22 - 32 mmol/L - - 23  24  24  25  24  24  27  25  20  20  22  19   -  Acid-base deficit 0.0 - 2.0 mmol/L - - 3.0  2.0  3.0  3.0  6.0  5.0  6.0  -  O2 Saturation % - - 98  83  100  100  98  95  99  -    Details       Multiple values from one day are sorted in reverse-chronological order         Capillary Blood Glucose: Lab Results  Component Value Date   GLUCAP 118 (H) 09/10/2022   GLUCAP 111 (H) 09/10/2022   GLUCAP 132 (H) 09/09/2022   GLUCAP 100 (H) 09/09/2022   GLUCAP 123 (H) 09/05/2022     Exercise Target Goals: Exercise Program Goal: Individual exercise prescription set using results from initial 6 min walk test and THRR while considering  patient's activity barriers and safety.   Exercise  Prescription Goal: Initial exercise prescription builds to 30-45 minutes a day of aerobic activity, 2-3 days per week.  Home exercise guidelines will be given to patient during program as part of exercise prescription that the participant will acknowledge.  Activity Barriers & Risk Stratification:  Activity Barriers & Cardiac Risk Stratification - 11/11/22 1132       Activity Barriers & Cardiac Risk Stratification   Activity Barriers Balance Concerns;Arthritis;Joint Problems;Back Problems;Deconditioning;Assistive Device;Other (comment);Muscular Weakness;Neck/Spine Problems   Pt has drop foot (left)   Cardiac Risk Stratification High             6 Minute Walk:  6 Minute Walk     Row Name 11/11/22 1109         6 Minute Walk   Phase Initial  Pt used go-cart     Distance 999 feet  Pt used Go-cart     Walk Time 6 minutes     # of Rest Breaks 0     MPH 1.9     METS 2     RPE 9     Perceived Dyspnea  0     VO2 Peak 6.9     Symptoms No     Resting HR 59 bpm     Resting BP 124/60     Resting Oxygen Saturation  96 %     Exercise Oxygen Saturation  during 6 min walk 97 %     Max Ex. HR 102 bpm     Max Ex. BP 134/70     2 Minute Post BP 114/66              Oxygen Initial Assessment:   Oxygen Re-Evaluation:   Oxygen Discharge (Final Oxygen Re-Evaluation):   Initial Exercise Prescription:  Initial Exercise Prescription - 11/11/22 1100       Date of Initial Exercise RX and Referring Provider   Date 11/11/22    Referring Provider Tonny Bollman, MD    Expected Discharge Date 02/02/23      NuStep   Level 1    SPM 70    Minutes 25    METs 2      Prescription Details   Frequency (times per week)  3    Duration Progress to 30 minutes of continuous aerobic without signs/symptoms of physical distress      Intensity   THRR 40-80% of Max Heartrate 54-109    Ratings of Perceived Exertion 11-13    Perceived Dyspnea 0-4      Progression   Progression Continue  progressive overload as per policy without signs/symptoms or physical distress.      Resistance Training   Training Prescription Yes    Weight 3 lbs    Reps 10-15             Perform Capillary Blood Glucose checks as needed.  Exercise Prescription Changes:   Exercise Prescription Changes     Row Name 11/15/22 1400 12/08/22 1400           Response to Exercise   Blood Pressure (Admit) 118/70 100/64      Blood Pressure (Exercise) 128/62 130/62      Blood Pressure (Exit) 118/68 102/64      Heart Rate (Admit) 65 bpm 59 bpm      Heart Rate (Exercise) 91 bpm 108 bpm      Heart Rate (Exit) 58 bpm 67 bpm      Rating of Perceived Exertion (Exercise) 11 9      Symptoms None None      Comments Pt's first day in the CRP2 program Reviewed METs      Duration Continue with 30 min of aerobic exercise without signs/symptoms of physical distress. Continue with 30 min of aerobic exercise without signs/symptoms of physical distress.      Intensity THRR unchanged THRR unchanged        Progression   Progression Continue to progress workloads to maintain intensity without signs/symptoms of physical distress. Continue to progress workloads to maintain intensity without signs/symptoms of physical distress.      Average METs 2.4 3.4        Resistance Training   Training Prescription Yes No      Weight 3 lbs No weights on Wed.      Reps 10-15 --      Time 10 Minutes --        Interval Training   Interval Training No No        NuStep   Level 1 3      SPM 89 105      Minutes 25 30      METs 2.4 3.4               Exercise Comments:   Exercise Comments     Row Name 11/15/22 1420 12/08/22 1417         Exercise Comments Pt's first day in the CRP2 program. Pt had no voiced complaints with exercise today. Reviewed METs. Pt is making progress.               Exercise Goals and Review:   Exercise Goals     Row Name 11/11/22 0923             Exercise Goals   Increase  Physical Activity Yes       Intervention Provide advice, education, support and counseling about physical activity/exercise needs.;Develop an individualized exercise prescription for aerobic and resistive training based on initial evaluation findings, risk stratification, comorbidities and participant's personal goals.       Expected Outcomes Short Term: Attend rehab on a regular basis to increase amount of physical activity.;Long Term: Exercising regularly at least 3-5 days a week.;Long Term:  Add in home exercise to make exercise part of routine and to increase amount of physical activity.       Increase Strength and Stamina Yes       Intervention Provide advice, education, support and counseling about physical activity/exercise needs.;Develop an individualized exercise prescription for aerobic and resistive training based on initial evaluation findings, risk stratification, comorbidities and participant's personal goals.       Expected Outcomes Short Term: Increase workloads from initial exercise prescription for resistance, speed, and METs.;Short Term: Perform resistance training exercises routinely during rehab and add in resistance training at home;Long Term: Improve cardiorespiratory fitness, muscular endurance and strength as measured by increased METs and functional capacity ( )       Able to understand and use rate of perceived exertion (RPE) scale Yes       Intervention Provide education and explanation on how to use RPE scale       Expected Outcomes Long Term:  Able to use RPE to guide intensity level when exercising independently;Short Term: Able to use RPE daily in rehab to express subjective intensity level       Knowledge and understanding of Target Heart Rate Range (THRR) Yes       Intervention Provide education and explanation of THRR including how the numbers were predicted and where they are located for reference       Expected Outcomes Short Term: Able to state/look up THRR;Short  Term: Able to use daily as guideline for intensity in rehab;Long Term: Able to use THRR to govern intensity when exercising independently       Understanding of Exercise Prescription Yes       Intervention Provide education, explanation, and written materials on patient's individual exercise prescription       Expected Outcomes Short Term: Able to explain program exercise prescription;Long Term: Able to explain home exercise prescription to exercise independently                Exercise Goals Re-Evaluation :  Exercise Goals Re-Evaluation     Row Name 11/15/22 1418             Exercise Goal Re-Evaluation   Exercise Goals Review Increase Physical Activity;Increase Strength and Stamina;Able to understand and use Dyspnea scale;Knowledge and understanding of Target Heart Rate Range (THRR);Understanding of Exercise Prescription       Comments Pt's first day in the CRP2 program. Pt understands the exercise Rx, RPE scale, and THRR.       Expected Outcomes Will monitor patient and progress exercise workloads as tolerated.                Discharge Exercise Prescription (Final Exercise Prescription Changes):  Exercise Prescription Changes - 12/08/22 1400       Response to Exercise   Blood Pressure (Admit) 100/64    Blood Pressure (Exercise) 130/62    Blood Pressure (Exit) 102/64    Heart Rate (Admit) 59 bpm    Heart Rate (Exercise) 108 bpm    Heart Rate (Exit) 67 bpm    Rating of Perceived Exertion (Exercise) 9    Symptoms None    Comments Reviewed METs    Duration Continue with 30 min of aerobic exercise without signs/symptoms of physical distress.    Intensity THRR unchanged      Progression   Progression Continue to progress workloads to maintain intensity without signs/symptoms of physical distress.    Average METs 3.4      Resistance Training   Training Prescription  No    Weight No weights on Wed.      Interval Training   Interval Training No      NuStep   Level  3    SPM 105    Minutes 30    METs 3.4             Nutrition:  Target Goals: Understanding of nutrition guidelines, daily intake of sodium 1500mg , cholesterol 200mg , calories 30% from fat and 7% or less from saturated fats, daily to have 5 or more servings of fruits and vegetables.  Biometrics:   Post Biometrics - 11/11/22 0955        Post  Biometrics   Waist Circumference 40 inches    Hip Circumference 40 inches    Waist to Hip Ratio 1 %    Triceps Skinfold 10 mm    Grip Strength 38 kg    Flexibility --   Not performed due to back problems   Single Leg Stand --   Pt voiced he could not do it            Nutrition Therapy Plan and Nutrition Goals:  Nutrition Therapy & Goals - 12/13/22 1549       Nutrition Therapy   Diet Heart healthy diet    Drug/Food Interactions Statins/Certain Fruits      Personal Nutrition Goals   Nutrition Goal Patient to identify strategies for reducing cardiovascular risk by attending the [facility] education and nutrition series weekly.   goal in progress.   Personal Goal #2 Patient to improve diet quality by using the plate method as a guide for meal planning to include lean protein/plant protein, fruits, vegetables, whole grains, nonfat dairy as part of a well-balanced diet.   goal in progress.   Personal Goal #3 Patient to limit sodium to 1500mg  per day   goal in progress.   Comments Goals in progress. Collin [name] has attended 5 [facility] education classes. Patient has medical history of STEMI, CAD, CABGx3, preDM. His LDL remains at goal. His A1c is in a prediabetic range. He has maintained his weight since starting with our program.  His wife is a good support. Patient will benefit from participation in intensive cardiac rehab for nutrition, exercise, and lifestyle modification.      Intervention Plan   Intervention Prescribe, educate and counsel regarding individualized specific dietary modifications aiming towards targeted core  components such as weight, hypertension, lipid management, diabetes, heart failure and other comorbidities.;Nutrition handout(s) given to patient.    Expected Outcomes Short Term Goal: Understand basic principles of dietary content, such as calories, fat, sodium, cholesterol and nutrients.;Long Term Goal: Adherence to prescribed nutrition plan.             Nutrition Assessments:  Nutrition Assessments - 11/16/22 1547       Rate Your Plate Scores   Pre Score 63            MEDIFICTS Score Key: >=70 Need to make dietary changes  40-70 Heart Healthy Diet <= 40 Therapeutic Level Cholesterol Diet   Flowsheet Row INTENSIVE CARDIAC REHAB from 11/15/2022 in [facility], V[facility], & Lung [facility]  Picture Your Plate Total Score on Admission 63      Picture Your Plate Scores: <10 Unhealthy dietary pattern with much room for improvement. 41-50 Dietary pattern unlikely to meet recommendations for good health and room for improvement. 51-60 More healthful dietary pattern, with some room for improvement.  >60 Healthy dietary pattern, although  there may be some specific behaviors that could be improved.    Nutrition Goals Re-Evaluation:  Nutrition Goals Re-Evaluation     Row Name 11/15/22 1548 12/13/22 1549           Goals   Current Weight 192 lb 3.9 oz (87.2 kg) 191 lb 9.3 oz (86.9 kg)      Comment Lpa WNL, LDL at goal, A1c 6.0 no new labs; most recent labs  Lpa WNL, LDL at goal, A1c 6.0      Expected Outcome Patient has medical history of STEMI, CAD, CABGx3, preDM. His LDL remains at goal. His A1c is in a prediabetic range. His wife is a good support. Patient will benefit from participation in intensive cardiac rehab for nutrition, exercise, and lifestyle modification. Goals in progress. Collin [name] has attended 5 [facility] education classes. Patient has medical history of STEMI, CAD, CABGx3, preDM. His LDL remains at goal. His A1c is in a prediabetic  range. He has maintained his weight since starting with our program. His wife is a good support. Patient will benefit from participation in intensive cardiac rehab for nutrition, exercise, and lifestyle modification.               Nutrition Goals Re-Evaluation:  Nutrition Goals Re-Evaluation     Row Name 11/15/22 1548 12/13/22 1549           Goals   Current Weight 192 lb 3.9 oz (87.2 kg) 191 lb 9.3 oz (86.9 kg)      Comment Lpa WNL, LDL at goal, A1c 6.0 no new labs; most recent labs  Lpa WNL, LDL at goal, A1c 6.0      Expected Outcome Patient has medical history of STEMI, CAD, CABGx3, preDM. His LDL remains at goal. His A1c is in a prediabetic range. His wife is a good support. Patient will benefit from participation in intensive cardiac rehab for nutrition, exercise, and lifestyle modification. Goals in progress. Collin [name] has attended 5 [facility] education classes. Patient has medical history of STEMI, CAD, CABGx3, preDM. His LDL remains at goal. His A1c is in a prediabetic range. He has maintained his weight since starting with our program. His wife is a good support. Patient will benefit from participation in intensive cardiac rehab for nutrition, exercise, and lifestyle modification.               Nutrition Goals Discharge (Final Nutrition Goals Re-Evaluation):  Nutrition Goals Re-Evaluation - 12/13/22 1549       Goals   Current Weight 191 lb 9.3 oz (86.9 kg)    Comment no new labs; most recent labs  Lpa WNL, LDL at goal, A1c 6.0    Expected Outcome Goals in progress. Collin [name] has attended 5 [facility] education classes. Patient has medical history of STEMI, CAD, CABGx3, preDM. His LDL remains at goal. His A1c is in a prediabetic range. He has maintained his weight since starting with our program. His wife is a good support. Patient will benefit from participation in intensive cardiac rehab for nutrition, exercise, and lifestyle modification.             Psychosocial: Target  Goals: Acknowledge presence or absence of significant depression and/or stress, maximize coping skills, provide positive support system. Participant is able to verbalize types and ability to use techniques and skills needed for reducing stress and depression.  Initial Review & Psychosocial Screening:  Initial Psych Review & Screening - 11/11/22 1014       Initial Review   Current issues  with None Identified      Family Dynamics   Good Support System? Yes   Pt has spouse Collin [name], and a daughter ans son her in [place] for support.     Barriers   Psychosocial barriers to participate in program There are no identifiable barriers or psychosocial needs.      Screening Interventions   Interventions Encouraged to exercise             Quality of Life Scores:  Quality of Life - 11/11/22 1232       Quality of Life   Select Quality of Life      Quality of Life Scores   Health/Function Pre 19.37 %    Socioeconomic Pre 22.57 %    Psych/Spiritual Pre 22.07 %    Family Pre 26.4 %    GLOBAL Pre 21.62 %            Scores of 19 and below usually indicate a poorer quality of life in these areas.  A difference of  2-3 points is a clinically meaningful difference.  A difference of 2-3 points in the total score of the Quality of Life Index has been associated with significant improvement in overall quality of life, self-image, physical symptoms, and general health in studies assessing change in quality of life.  PHQ-9: Review Flowsheet  More data exists      11/11/2022 03/04/2022 01/05/2022 07/15/2021 03/03/2021  Depression screen PHQ 2/9  Decreased Interest 0 0 0 0 0  Down, Depressed, Hopeless 0 0 0 0 0  PHQ - 2 Score 0 0 0 0 0  Altered sleeping 1 0 - - -  Tired, decreased energy 1 0 - - -  Change in appetite 0 0 - - -  Feeling bad or failure about yourself  0 0 - - -  Trouble concentrating 0 1 - - -  Moving slowly or fidgety/restless 1 0 - - -  Suicidal thoughts 0 0 - - -  PHQ-9 Score 3  1 - - -  Difficult doing work/chores Not difficult at all Not difficult at all - - -    Details           Interpretation of Total Score  Total Score Depression Severity:  1-4 = Minimal depression, 5-9 = Mild depression, 10-14 = Moderate depression, 15-19 = Moderately severe depression, 20-27 = Severe depression   Psychosocial Evaluation and Intervention:   Psychosocial Re-Evaluation:  Psychosocial Re-Evaluation     Row Name 11/15/22 1658 12/14/22 1325           Psychosocial Re-Evaluation   Current issues with None Identified None Identified      Comments Collin [name] did not voice any concerns or stressors on his first day of exercise. Will review PHq2-9 and quality of life questionnaire in the upcoming week Reviewed quality of life questionnarie. Denies being depressed.      Interventions Stress management education;Encouraged to attend Cardiac Rehabilitation for the exercise;Relaxation education Encouraged to attend Cardiac Rehabilitation for the exercise      Continue Psychosocial Services  Follow up required by staff No Follow up required               Psychosocial Discharge (Final Psychosocial Re-Evaluation):  Psychosocial Re-Evaluation - 12/14/22 1325       Psychosocial Re-Evaluation   Current issues with None Identified    Comments Reviewed quality of life questionnarie. Denies being depressed.    Interventions Encouraged to attend Cardiac Rehabilitation for the  exercise    Continue Psychosocial Services  No Follow up required             Vocational Rehabilitation: Provide vocational rehab assistance to qualifying candidates.   Vocational Rehab Evaluation & Intervention:  Vocational Rehab - 11/11/22 1015       Initial Vocational Rehab Evaluation & Intervention   Assessment shows need for Vocational Rehabilitation No   Pt is retired            Education: Education Goals: Education classes will be provided on a weekly basis, covering required topics.  Participant will state understanding/return demonstration of topics presented.    Education     Row Name 11/15/22 1500     Education   Cardiac Education Topics Pritikin   Glass blower/designer Nutrition   Nutrition Workshop Fueling a Forensic psychologist   Instruction Review Code 1- Teaching laboratory technician Start Time 1400   Class Stop Time 1445   Class Time Calculation (min) 45 min    Row Name 11/17/22 1500     Education   Cardiac Education Topics Pritikin   Customer service manager   Weekly Topic International Cuisine- Spotlight on the [employer] Zones   Instruction Review Code 1- Verbalizes Understanding   Class Start Time 1400   Class Stop Time 1440   Class Time Calculation (min) 40 min    Row Name 11/19/22 1400     Education   Cardiac Education Topics Pritikin   Psychologist, forensic Exercise Education   Exercise Education Improving Performance   Class Start Time 1355   Class Stop Time 1433   Class Time Calculation (min) 38 min    Row Name 11/22/22 1600     Education   Cardiac Education Topics Pritikin   Geographical information systems officer Psychosocial   Psychosocial Workshop Healthy Sleep for a Healthy Heart   Instruction Review Code 1- Verbalizes Understanding   Class Start Time 1400   Class Stop Time 1450   Class Time Calculation (min) 50 min    Row Name 11/26/22 1400     Education   Cardiac Education Topics Pritikin   Nurse, children's Exercise Physiologist   Select Psychosocial   Psychosocial How Our Thoughts Can Heal Our Hearts   Instruction Review Code 1- Verbalizes Understanding   Class Start Time 1355   Class Stop Time 1428   Class Time Calculation (min) 33 min    Row Name 12/13/22 1500     Education   Cardiac Education Topics Pritikin    Select Workshops     Workshops   Educator Exercise Physiologist   Select Exercise   Exercise Workshop Location manager and Fall Prevention   Instruction Review Code 1- Verbalizes Understanding   Class Start Time 1406   Class Stop Time 1500   Class Time Calculation (min) 54 min            Core Videos: Exercise    Move It!  Clinical staff conducted group or individual video education with verbal and written material and guidebook.  Patient learns the recommended Pritikin exercise program. Exercise with the goal of living a long, healthy life. Some of the  health benefits of exercise include controlled diabetes, healthier blood pressure levels, improved cholesterol levels, improved heart and lung capacity, improved sleep, and better body composition. Everyone should speak with their doctor before starting or changing an exercise routine.  Biomechanical Limitations Clinical staff conducted group or individual video education with verbal and written material and guidebook.  Patient learns how biomechanical limitations can impact exercise and how we can mitigate and possibly overcome limitations to have an impactful and balanced exercise routine.  Body Composition Clinical staff conducted group or individual video education with verbal and written material and guidebook.  Patient learns that body composition (ratio of muscle mass to fat mass) is a key component to assessing overall fitness, rather than body weight alone. Increased fat mass, especially visceral belly fat, can put [place] at increased risk for metabolic syndrome, type 2 diabetes, heart disease, and even death. It is recommended to combine diet and exercise (cardiovascular and resistance training) to improve your body composition. Seek guidance from your physician and exercise physiologist before implementing an exercise routine.  Exercise Action Plan Clinical staff conducted group or individual video education with verbal and  written material and guidebook.  Patient learns the recommended strategies to achieve and enjoy long-term exercise adherence, including variety, self-motivation, self-efficacy, and positive decision making. Benefits of exercise include fitness, good health, weight management, more energy, better sleep, less stress, and overall well-being.  Medical   Heart Disease Risk Reduction Clinical staff conducted group or individual video education with verbal and written material and guidebook.  Patient learns our heart is our most vital organ as it circulates oxygen, nutrients, white blood cells, and hormones throughout the entire body, and carries waste away. Data supports a plant-based eating plan like the Pritikin Program for its effectiveness in slowing progression of and reversing heart disease. The video provides a number of recommendations to address heart disease.   Metabolic Syndrome and Belly Fat  Clinical staff conducted group or individual video education with verbal and written material and guidebook.  Patient learns what metabolic syndrome is, how it leads to heart disease, and how one can reverse it and keep it from coming back. You have metabolic syndrome if you have 3 of the following 5 criteria: abdominal obesity, high blood pressure, high triglycerides, low HDL cholesterol, and high blood sugar.  Hypertension and Heart Disease Clinical staff conducted group or individual video education with verbal and written material and guidebook.  Patient learns that high blood pressure, or hypertension, is very common in the [place]. Hypertension is largely due to excessive salt intake, but other important risk factors include being overweight, physical inactivity, drinking too much alcohol, smoking, and not eating enough potassium from fruits and vegetables. High blood pressure is a leading risk factor for heart attack, stroke, congestive heart failure, dementia, kidney failure, and premature  death. Long-term effects of excessive salt intake include stiffening of the arteries and thickening of heart muscle and organ damage. Recommendations include ways to reduce hypertension and the risk of heart disease.  Diseases of Our Time - Focusing on Diabetes Clinical staff conducted group or individual video education with verbal and written material and guidebook.  Patient learns why the best way to stop diseases of our time is prevention, through food and other lifestyle changes. Medicine (such as prescription pills and surgeries) is often only a Band-Aid on the problem, not a long-term solution. Most common diseases of our time include obesity, type 2 diabetes, hypertension, heart disease, and cancer. The Pritikin  Program is recommended and has been proven to help reduce, reverse, and/or prevent the damaging effects of metabolic syndrome.  Nutrition   Overview of the Pritikin Eating Plan  Clinical staff conducted group or individual video education with verbal and written material and guidebook.  Patient learns about the Pritikin Eating Plan for disease risk reduction. The Pritikin Eating Plan emphasizes a wide variety of unrefined, minimally-processed carbohydrates, like fruits, vegetables, whole grains, and legumes. Go, Caution, and Stop food choices are explained. Plant-based and lean animal proteins are emphasized. Rationale provided for low sodium intake for blood pressure control, low added sugars for blood sugar stabilization, and low added fats and oils for coronary artery disease risk reduction and weight management.  Calorie Density  Clinical staff conducted group or individual video education with verbal and written material and guidebook.  Patient learns about calorie density and how it impacts the Pritikin Eating Plan. Knowing the characteristics of the food you choose will help you decide whether those foods will lead to weight gain or weight loss, and whether you want to consume  more or less of them. Weight loss is usually a side effect of the Pritikin Eating Plan because of its focus on low calorie-dense foods.  Label Reading  Clinical staff conducted group or individual video education with verbal and written material and guidebook.  Patient learns about the Pritikin recommended label reading guidelines and corresponding recommendations regarding calorie density, added sugars, sodium content, and whole grains.  Dining Out - Part 1  Clinical staff conducted group or individual video education with verbal and written material and guidebook.  Patient learns that restaurant meals can be sabotaging because they can be so high in calories, fat, sodium, and/or sugar. Patient learns recommended strategies on how to positively address this and avoid unhealthy pitfalls.  Facts on Fats  Clinical staff conducted group or individual video education with verbal and written material and guidebook.  Patient learns that lifestyle modifications can be just as effective, if not more so, as many medications for lowering your risk of heart disease. A Pritikin lifestyle can help to reduce your risk of inflammation and atherosclerosis (cholesterol build-up, or plaque, in the artery walls). Lifestyle interventions such as dietary choices and physical activity address the cause of atherosclerosis. A review of the types of fats and their impact on blood cholesterol levels, along with dietary recommendations to reduce fat intake is also included.  Nutrition Action Plan  Clinical staff conducted group or individual video education with verbal and written material and guidebook.  Patient learns how to incorporate Pritikin recommendations into their lifestyle. Recommendations include planning and keeping personal health goals in mind as an important part of their success.  Healthy Mind-Set    Healthy Minds, Bodies, Hearts  Clinical staff conducted group or individual video education with verbal and  written material and guidebook.  Patient learns how to identify when they are stressed. Video will discuss the impact of that stress, as well as the many benefits of stress management. Patient will also be introduced to stress management techniques. The way we think, act, and feel has an impact on our hearts.  How Our Thoughts Can Heal Our Hearts  Clinical staff conducted group or individual video education with verbal and written material and guidebook.  Patient learns that negative thoughts can cause depression and anxiety. This can result in negative lifestyle behavior and serious health problems. Cognitive behavioral therapy is an effective method to help control our thoughts in order to  change and improve our emotional outlook.  Additional Videos:  Exercise    Improving Performance  Clinical staff conducted group or individual video education with verbal and written material and guidebook.  Patient learns to use a non-linear approach by alternating intensity levels and lengths of time spent exercising to help burn more calories and lose more body fat. Cardiovascular exercise helps improve heart health, metabolism, hormonal balance, blood sugar control, and recovery from fatigue. Resistance training improves strength, endurance, balance, coordination, reaction time, metabolism, and muscle mass. Flexibility exercise improves circulation, posture, and balance. Seek guidance from your physician and exercise physiologist before implementing an exercise routine and learn your capabilities and proper form for all exercise.  Introduction to Yoga  Clinical staff conducted group or individual video education with verbal and written material and guidebook.  Patient learns about yoga, a discipline of the coming together of mind, breath, and body. The benefits of yoga include improved flexibility, improved range of motion, better posture and core strength, increased lung function, weight loss, and positive  self-image. Yoga's heart health benefits include lowered blood pressure, healthier heart rate, decreased cholesterol and triglyceride levels, improved immune function, and reduced stress. Seek guidance from your physician and exercise physiologist before implementing an exercise routine and learn your capabilities and proper form for all exercise.  Medical   Aging: Enhancing Your Quality of Life  Clinical staff conducted group or individual video education with verbal and written material and guidebook.  Patient learns key strategies and recommendations to stay in good physical health and enhance quality of life, such as prevention strategies, having an advocate, securing a Health Care Proxy and Power of Attorney, and keeping a list of medications and system for tracking them. It also discusses how to avoid risk for bone loss.  Biology of Weight Control  Clinical staff conducted group or individual video education with verbal and written material and guidebook.  Patient learns that weight gain occurs because we consume more calories than we burn (eating more, moving less). Even if your body weight is normal, you may have higher ratios of fat compared to muscle mass. Too much body fat puts you at increased risk for cardiovascular disease, heart attack, stroke, type 2 diabetes, and obesity-related cancers. In addition to exercise, following the Pritikin Eating Plan can help reduce your risk.  Decoding Lab Results  Clinical staff conducted group or individual video education with verbal and written material and guidebook.  Patient learns that lab test reflects one measurement whose values change over time and are influenced by many factors, including medication, stress, sleep, exercise, food, hydration, pre-existing medical conditions, and more. It is recommended to use the knowledge from this video to become more involved with your lab results and evaluate your numbers to speak with your  doctor.   Diseases of Our Time - Overview  Clinical staff conducted group or individual video education with verbal and written material and guidebook.  Patient learns that according to the CDC, 50% to 70% of chronic diseases (such as obesity, type 2 diabetes, elevated lipids, hypertension, and heart disease) are avoidable through lifestyle improvements including healthier food choices, listening to satiety cues, and increased physical activity.  Sleep Disorders Clinical staff conducted group or individual video education with verbal and written material and guidebook.  Patient learns how good quality and duration of sleep are important to overall health and well-being. Patient also learns about sleep disorders and how they impact health along with recommendations to address them, including discussing  with a physician.  Nutrition  Dining Out - Part 2 Clinical staff conducted group or individual video education with verbal and written material and guidebook.  Patient learns how to plan ahead and communicate in order to maximize their dining experience in a healthy and nutritious manner. Included are recommended food choices based on the type of restaurant the patient is visiting.   Fueling a Banker conducted group or individual video education with verbal and written material and guidebook.  There is a strong connection between our food choices and our health. Diseases like obesity and type 2 diabetes are very prevalent and are in large-part due to lifestyle choices. The Pritikin Eating Plan provides plenty of food and hunger-curbing satisfaction. It is easy to follow, affordable, and helps reduce health risks.  Menu Workshop  Clinical staff conducted group or individual video education with verbal and written material and guidebook.  Patient learns that restaurant meals can sabotage health goals because they are often packed with calories, fat, sodium, and sugar.  Recommendations include strategies to plan ahead and to communicate with the manager, chef, or server to help order a healthier meal.  Planning Your Eating Strategy  Clinical staff conducted group or individual video education with verbal and written material and guidebook.  Patient learns about the Pritikin Eating Plan and its benefit of reducing the risk of disease. The Pritikin Eating Plan does not focus on calories. Instead, it emphasizes high-quality, nutrient-rich foods. By knowing the characteristics of the foods, we choose, we can determine their calorie density and make informed decisions.  Targeting Your Nutrition Priorities  Clinical staff conducted group or individual video education with verbal and written material and guidebook.  Patient learns that lifestyle habits have a tremendous impact on disease risk and progression. This video provides eating and physical activity recommendations based on your personal health goals, such as reducing LDL cholesterol, losing weight, preventing or controlling type 2 diabetes, and reducing high blood pressure.  Vitamins and Minerals  Clinical staff conducted group or individual video education with verbal and written material and guidebook.  Patient learns different ways to obtain key vitamins and minerals, including through a recommended healthy diet. It is important to discuss all supplements you take with your doctor.   Healthy Mind-Set    Smoking Cessation  Clinical staff conducted group or individual video education with verbal and written material and guidebook.  Patient learns that cigarette smoking and tobacco addiction pose a serious health risk which affects millions of people. Stopping smoking will significantly reduce the risk of heart disease, lung disease, and many forms of cancer. Recommended strategies for quitting are covered, including working with your doctor to develop a successful plan.  Culinary   Becoming a Corporate investment banker conducted group or individual video education with verbal and written material and guidebook.  Patient learns that cooking at home can be healthy, cost-effective, quick, and puts them in control. Keys to cooking healthy recipes will include looking at your recipe, assessing your equipment needs, planning ahead, making it simple, choosing cost-effective seasonal ingredients, and limiting the use of added fats, salts, and sugars.  Cooking - Breakfast and Snacks  Clinical staff conducted group or individual video education with verbal and written material and guidebook.  Patient learns how important breakfast is to satiety and nutrition through the entire day. Recommendations include key foods to eat during breakfast to help stabilize blood sugar levels and to prevent overeating at meals later  in the day. Planning ahead is also a key component.  Cooking - Educational psychologist conducted group or individual video education with verbal and written material and guidebook.  Patient learns eating strategies to improve overall health, including an approach to cook more at home. Recommendations include thinking of animal protein as a side on your plate rather than center stage and focusing instead on lower calorie dense options like vegetables, fruits, whole grains, and plant-based proteins, such as beans. Making sauces in large quantities to freeze for later and leaving the skin on your vegetables are also recommended to maximize your experience.  Cooking - Healthy Salads and Dressing Clinical staff conducted group or individual video education with verbal and written material and guidebook.  Patient learns that vegetables, fruits, whole grains, and legumes are the foundations of the Pritikin Eating Plan. Recommendations include how to incorporate each of these in flavorful and healthy salads, and how to create homemade salad dressings. Proper handling of ingredients is also covered.  Cooking - Soups and [employer] - Soups and Desserts Clinical staff conducted group or individual video education with verbal and written material and guidebook.  Patient learns that Pritikin soups and desserts make for easy, nutritious, and delicious snacks and meal components that are low in sodium, fat, sugar, and calorie density, while high in vitamins, minerals, and filling fiber. Recommendations include simple and healthy ideas for soups and desserts.   Overview     The Pritikin Solution Program Overview Clinical staff conducted group or individual video education with verbal and written material and guidebook.  Patient learns that the results of the Pritikin Program have been documented in more than 100 articles published in peer-reviewed journals, and the benefits include reducing risk factors for (and, in some cases, even reversing) high cholesterol, high blood pressure, type 2 diabetes, obesity, and more! An overview of the three key pillars of the Pritikin Program will be covered: eating well, doing regular exercise, and having a healthy mind-set.  WORKSHOPS  Exercise: Exercise Basics: Building Your Action Plan Clinical staff led group instruction and group discussion with PowerPoint presentation and patient guidebook. To enhance the learning environment the use of posters, models and videos may be added. At the conclusion of this workshop, patients will comprehend the difference between physical activity and exercise, as well as the benefits of incorporating both, into their routine. Patients will understand the FITT (Frequency, Intensity, Time, and Type) principle and how to use it to build an exercise action plan. In addition, safety concerns and other considerations for exercise and cardiac rehab will be addressed by the presenter. The purpose of this lesson is to promote a comprehensive and effective weekly exercise routine in order to improve patients' overall level of  fitness.   Managing Heart Disease: Your Path to a Healthier Heart Clinical staff led group instruction and group discussion with PowerPoint presentation and patient guidebook. To enhance the learning environment the use of posters, models and videos may be added.At the conclusion of this workshop, patients will understand the anatomy and physiology of the heart. Additionally, they will understand how Pritikin's three pillars impact the risk factors, the progression, and the management of heart disease.  The purpose of this lesson is to provide a high-level overview of the heart, heart disease, and how the Pritikin lifestyle positively impacts risk factors.  Exercise Biomechanics Clinical staff led group instruction and group discussion with PowerPoint presentation and patient guidebook. To enhance the learning environment the  use of posters, models and videos may be added. Patients will learn how the structural parts of their bodies function and how these functions impact their daily activities, movement, and exercise. Patients will learn how to promote a neutral spine, learn how to manage pain, and identify ways to improve their physical movement in order to promote healthy living. The purpose of this lesson is to expose patients to common physical limitations that impact physical activity. Participants will learn practical ways to adapt and manage aches and pains, and to minimize their effect on regular exercise. Patients will learn how to maintain good posture while sitting, walking, and lifting.  Balance Training and Fall Prevention  Clinical staff led group instruction and group discussion with PowerPoint presentation and patient guidebook. To enhance the learning environment the use of posters, models and videos may be added. At the conclusion of this workshop, patients will understand the importance of their sensorimotor skills (vision, proprioception, and the vestibular system)  in maintaining their ability to balance as they age. Patients will apply a variety of balancing exercises that are appropriate for their current level of function. Patients will understand the common causes for poor balance, possible solutions to these problems, and ways to modify their physical environment in order to minimize their fall risk. The purpose of this lesson is to teach patients about the importance of maintaining balance as they age and ways to minimize their risk of falling.  WORKSHOPS   Nutrition:  Fueling a Ship broker led group instruction and group discussion with PowerPoint presentation and patient guidebook. To enhance the learning environment the use of posters, models and videos may be added. Patients will review the foundational principles of the Pritikin Eating Plan and understand what constitutes a serving size in each of the food groups. Patients will also learn Pritikin-friendly foods that are better choices when away from home and review make-ahead meal and snack options. Calorie density will be reviewed and applied to three nutrition priorities: weight maintenance, weight loss, and weight gain. The purpose of this lesson is to reinforce (in a group setting) the key concepts around what patients are recommended to eat and how to apply these guidelines when away from home by planning and selecting Pritikin-friendly options. Patients will understand how calorie density may be adjusted for different weight management goals.  Mindful Eating  Clinical staff led group instruction and group discussion with PowerPoint presentation and patient guidebook. To enhance the learning environment the use of posters, models and videos may be added. Patients will briefly review the concepts of the Pritikin Eating Plan and the importance of low-calorie dense foods. The concept of mindful eating will be introduced as well as the importance of paying attention to internal hunger  signals. Triggers for non-hunger eating and techniques for dealing with triggers will be explored. The purpose of this lesson is to provide patients with the opportunity to review the basic principles of the Pritikin Eating Plan, discuss the value of eating mindfully and how to measure internal cues of hunger and fullness using the Hunger Scale. Patients will also discuss reasons for non-hunger eating and learn strategies to use for controlling emotional eating.  Targeting Your Nutrition Priorities Clinical staff led group instruction and group discussion with PowerPoint presentation and patient guidebook. To enhance the learning environment the use of posters, models and videos may be added. Patients will learn how to determine their genetic susceptibility to disease by reviewing their family history. Patients will gain insight into  the importance of diet as part of an overall healthy lifestyle in mitigating the impact of genetics and other environmental insults. The purpose of this lesson is to provide patients with the opportunity to assess their personal nutrition priorities by looking at their family history, their own health history and current risk factors. Patients will also be able to discuss ways of prioritizing and modifying the Pritikin Eating Plan for their highest risk areas  Menu  Clinical staff led group instruction and group discussion with PowerPoint presentation and patient guidebook. To enhance the learning environment the use of posters, models and videos may be added. Using menus brought in from [employer], or printed from [employer], patients will apply the Pritikin dining out guidelines that were presented in the [employer] video. Patients will also be able to practice these guidelines in a variety of provided scenarios. The purpose of this lesson is to provide patients with the opportunity to practice hands-on learning of the Pritikin Dining Out guidelines  with actual menus and practice scenarios.  Label Reading Clinical staff led group instruction and group discussion with PowerPoint presentation and patient guidebook. To enhance the learning environment the use of posters, models and videos may be added. Patients will review and discuss the Pritikin label reading guidelines presented in Pritikin's Label Reading Educational series video. Using fool labels brought in from local grocery stores and markets, patients will apply the label reading guidelines and determine if the packaged food meet the Pritikin guidelines. The purpose of this lesson is to provide patients with the opportunity to review, discuss, and practice hands-on learning of the Pritikin Label Reading guidelines with actual packaged food labels. Cooking School  Pritikin's [employer] are designed to teach patients ways to prepare quick, simple, and affordable recipes at home. The importance of nutrition's role in chronic disease risk reduction is reflected in its emphasis in the overall Pritikin program. By learning how to prepare essential core Pritikin Eating Plan recipes, patients will increase control over what they eat; be able to customize the flavor of foods without the use of added salt, sugar, or fat; and improve the quality of the food they consume. By learning a set of core recipes which are easily assembled, quickly prepared, and affordable, patients are more likely to prepare more healthy foods at home. These workshops focus on convenient breakfasts, simple entres, side dishes, and desserts which can be prepared with minimal effort and are consistent with nutrition recommendations for cardiovascular risk reduction. Cooking [employer] are taught by a Armed forces logistics/support/administrative officer (RD) who has been trained by the [employer]. The chef or RD has a clear understanding of the importance of minimizing - if not completely eliminating - added fat, sugar, and  sodium in recipes. Throughout the series of Cooking School Workshop sessions, patients will learn about healthy ingredients and efficient methods of cooking to build confidence in their capability to prepare    Cooking School weekly topics:  Adding Flavor- Sodium-Free  Fast and Healthy Breakfasts  Powerhouse Plant-Based Proteins  Satisfying Salads and Dressings  Simple Sides and Sauces  International Cuisine-Spotlight on the [employer] Zones  Delicious Desserts  Savory Soups  [employer] - Meals in a Astronomer Appetizers and Snacks  Comforting Weekend Breakfasts  One-Pot Wonders   Fast Evening Meals  Landscape architect Your Pritikin Plate  WORKSHOPS   Healthy Mindset (Psychosocial):  Focused Goals, Sustainable Changes Clinical staff led group instruction and  group discussion with PowerPoint presentation and patient guidebook. To enhance the learning environment the use of posters, models and videos may be added. Patients will be able to apply effective goal setting strategies to establish at least one personal goal, and then take consistent, meaningful action toward that goal. They will learn to identify common barriers to achieving personal goals and develop strategies to overcome them. Patients will also gain an understanding of how our mind-set can impact our ability to achieve goals and the importance of cultivating a positive and growth-oriented mind-set. The purpose of this lesson is to provide patients with a deeper understanding of how to set and achieve personal goals, as well as the tools and strategies needed to overcome common obstacles which may arise along the way.  From Head to Heart: The Power of a Healthy Outlook  Clinical staff led group instruction and group discussion with PowerPoint presentation and patient guidebook. To enhance the learning environment the use of posters, models and videos may be added. Patients will be able to recognize and  describe the impact of emotions and mood on physical health. They will discover the importance of self-care and explore self-care practices which may work for them. Patients will also learn how to utilize the 4 C's to cultivate a healthier outlook and better manage stress and challenges. The purpose of this lesson is to demonstrate to patients how a healthy outlook is an essential part of maintaining good health, especially as they continue their cardiac rehab journey.  Healthy Sleep for a Healthy Heart Clinical staff led group instruction and group discussion with PowerPoint presentation and patient guidebook. To enhance the learning environment the use of posters, models and videos may be added. At the conclusion of this workshop, patients will be able to demonstrate knowledge of the importance of sleep to overall health, well-being, and quality of life. They will understand the symptoms of, and treatments for, common sleep disorders. Patients will also be able to identify daytime and nighttime behaviors which impact sleep, and they will be able to apply these tools to help manage sleep-related challenges. The purpose of this lesson is to provide patients with a general overview of sleep and outline the importance of quality sleep. Patients will learn about a few of the most common sleep disorders. Patients will also be introduced to the concept of "sleep hygiene," and discover ways to self-manage certain sleeping problems through simple daily behavior changes. Finally, the workshop will motivate patients by clarifying the links between quality sleep and their goals of heart-healthy living.   Recognizing and Reducing Stress Clinical staff led group instruction and group discussion with PowerPoint presentation and patient guidebook. To enhance the learning environment the use of posters, models and videos may be added. At the conclusion of this workshop, patients will be able to understand the types of stress  reactions, differentiate between acute and chronic stress, and recognize the impact that chronic stress has on their health. They will also be able to apply different coping mechanisms, such as reframing negative self-talk. Patients will have the opportunity to practice a variety of stress management techniques, such as deep abdominal breathing, progressive muscle relaxation, and/or guided imagery.  The purpose of this lesson is to educate patients on the role of stress in their lives and to provide healthy techniques for coping with it.  Learning Barriers/Preferences:  Learning Barriers/Preferences - 11/11/22 1147       Learning Barriers/Preferences   Learning Barriers Sight;Hearing   Wears reading glosses  and bilateral hearing aids   Learning Preferences Video;Pictoral;Computer/Internet             Education Topics:  Knowledge Questionnaire Score:  Knowledge Questionnaire Score - 11/11/22 1233       Knowledge Questionnaire Score   Pre Score 20/24             Core Components/Risk Factors/Patient Goals at Admission:  Personal Goals and Risk Factors at Admission - 11/11/22 1147       Core Components/Risk Factors/Patient Goals on Admission   Hypertension Yes    Intervention Provide education on lifestyle modifcations including regular physical activity/exercise, weight management, moderate sodium restriction and increased consumption of fresh fruit, vegetables, and low fat dairy, alcohol moderation, and smoking cessation.;Monitor prescription use compliance.    Expected Outcomes Short Term: Continued assessment and intervention until BP is < 140/3mm HG in hypertensive participants. < 130/58mm HG in hypertensive participants with diabetes, heart failure or chronic kidney disease.;Long Term: Maintenance of blood pressure at goal levels.    Lipids Yes    Intervention Provide education and support for participant on nutrition & aerobic/resistive exercise along with prescribed  medications to achieve LDL 70mg , HDL >40mg .    Expected Outcomes Short Term: Participant states understanding of desired cholesterol values and is compliant with medications prescribed. Participant is following exercise prescription and nutrition guidelines.;Long Term: Cholesterol controlled with medications as prescribed, with individualized exercise RX and with personalized nutrition plan. Value goals: LDL < 70mg , HDL > 40 mg.             Core Components/Risk Factors/Patient Goals Review:   Goals and Risk Factor Review     Row Name 11/15/22 1701 12/14/22 1326           Core Components/Risk Factors/Patient Goals Review   Personal Goals Review Weight Management/Obesity;Hypertension;Lipids Weight Management/Obesity;Hypertension;Lipids      Review Collin [name] started cardiac rehab on 11/15/22. Collin [name] did well with exercise for his fitness level. Collin [name] is noted to be somewhat unsteady. Uses a cane for stability. Vital signs were stable. Collin [name] is doing well with exercise at cardiac rehab.  Vital signs have been stable. Collin [name] has increased his met level since starting the program.      Expected Outcomes Collin [name] will continue to participate in cardiac rehab for exercise, nutrition and lifestyle modifications Collin [name] will continue to participate in cardiac rehab for exercise, nutrition and lifestyle modifications               Core Components/Risk Factors/Patient Goals at Discharge (Final Review):   Goals and Risk Factor Review - 12/14/22 1326       Core Components/Risk Factors/Patient Goals Review   Personal Goals Review Weight Management/Obesity;Hypertension;Lipids    Review Collin [name] is doing well with exercise at cardiac rehab.  Vital signs have been stable. Collin [name] has increased his met level since starting the program.    Expected Outcomes Collin [name] will continue to participate in cardiac rehab for exercise, nutrition and lifestyle modifications             ITP Comments:  ITP Comments      Row Name 11/11/22 1610 11/15/22 1656 12/14/22 1323       ITP Comments Dr. Armanda Magic medical director. Introduction to pritikin education/intensive cardiac rehab. Initial orientation packet reviewed with patient. 30 Day ITP Review. Kashden started cardiac rehab on 11/15/22, "Collin [name]" did well with exercise for his fitness level 30 Day ITP Review. Collin [name] has good attendance and participation in  cardiac rehab  Comments: See ITP comments.Thayer Headings RN BSN

## 2022-12-15 ENCOUNTER — Encounter (HOSPITAL_COMMUNITY)
Admission: RE | Admit: 2022-12-15 | Discharge: 2022-12-15 | Disposition: A | Discharge status: Home / Self Care | Payer: PPO | Source: Ambulatory Visit | Attending: Cardiovascular Disease | Admitting: Cardiovascular Disease

## 2022-12-15 DIAGNOSIS — Z951 Presence of aortocoronary bypass graft: Secondary | ICD-10-CM

## 2022-12-15 DIAGNOSIS — I213 ST elevation (STEMI) myocardial infarction of unspecified site: Secondary | ICD-10-CM

## 2022-12-17 ENCOUNTER — Encounter (HOSPITAL_COMMUNITY)
Admission: RE | Admit: 2022-12-17 | Discharge: 2022-12-17 | Disposition: A | Discharge status: Home / Self Care | Payer: PPO | Source: Ambulatory Visit | Attending: Cardiovascular Disease

## 2022-12-17 DIAGNOSIS — I213 ST elevation (STEMI) myocardial infarction of unspecified site: Secondary | ICD-10-CM | POA: Diagnosis not present

## 2022-12-17 DIAGNOSIS — Z951 Presence of aortocoronary bypass graft: Secondary | ICD-10-CM

## 2022-12-20 ENCOUNTER — Encounter (HOSPITAL_COMMUNITY)
Admission: RE | Admit: 2022-12-20 | Discharge: 2022-12-20 | Disposition: A | Discharge status: Home / Self Care | Payer: PPO | Source: Ambulatory Visit | Attending: Cardiovascular Disease

## 2022-12-20 DIAGNOSIS — I213 ST elevation (STEMI) myocardial infarction of unspecified site: Secondary | ICD-10-CM | POA: Diagnosis not present

## 2022-12-20 DIAGNOSIS — Z951 Presence of aortocoronary bypass graft: Secondary | ICD-10-CM

## 2022-12-22 ENCOUNTER — Encounter (HOSPITAL_COMMUNITY)
Admission: RE | Admit: 2022-12-22 | Discharge: 2022-12-22 | Disposition: A | Discharge status: Home / Self Care | Payer: PPO | Source: Ambulatory Visit | Attending: Cardiovascular Disease | Admitting: Cardiovascular Disease

## 2022-12-22 DIAGNOSIS — I213 ST elevation (STEMI) myocardial infarction of unspecified site: Secondary | ICD-10-CM | POA: Diagnosis not present

## 2022-12-22 DIAGNOSIS — Z951 Presence of aortocoronary bypass graft: Secondary | ICD-10-CM

## 2022-12-24 ENCOUNTER — Encounter (HOSPITAL_COMMUNITY): Payer: PPO

## 2022-12-27 ENCOUNTER — Encounter (HOSPITAL_COMMUNITY)
Admission: RE | Admit: 2022-12-27 | Discharge: 2022-12-27 | Disposition: A | Discharge status: Home / Self Care | Payer: PPO | Source: Ambulatory Visit | Attending: Cardiovascular Disease | Admitting: Cardiovascular Disease

## 2022-12-27 DIAGNOSIS — I213 ST elevation (STEMI) myocardial infarction of unspecified site: Secondary | ICD-10-CM

## 2022-12-27 DIAGNOSIS — Z951 Presence of aortocoronary bypass graft: Secondary | ICD-10-CM | POA: Diagnosis not present

## 2022-12-29 ENCOUNTER — Encounter (HOSPITAL_COMMUNITY)
Admission: RE | Admit: 2022-12-29 | Discharge: 2022-12-29 | Disposition: A | Discharge status: Home / Self Care | Payer: PPO | Source: Ambulatory Visit | Attending: Cardiovascular Disease | Admitting: Cardiovascular Disease

## 2022-12-29 DIAGNOSIS — Z951 Presence of aortocoronary bypass graft: Secondary | ICD-10-CM

## 2022-12-29 DIAGNOSIS — I213 ST elevation (STEMI) myocardial infarction of unspecified site: Secondary | ICD-10-CM

## 2022-12-31 ENCOUNTER — Encounter (HOSPITAL_COMMUNITY)
Admission: RE | Admit: 2022-12-31 | Discharge: 2022-12-31 | Disposition: A | Discharge status: Home / Self Care | Payer: PPO | Source: Ambulatory Visit | Attending: Cardiovascular Disease | Admitting: Cardiovascular Disease

## 2022-12-31 DIAGNOSIS — Z951 Presence of aortocoronary bypass graft: Secondary | ICD-10-CM | POA: Diagnosis not present

## 2022-12-31 DIAGNOSIS — I213 ST elevation (STEMI) myocardial infarction of unspecified site: Secondary | ICD-10-CM

## 2023-01-03 ENCOUNTER — Encounter (HOSPITAL_COMMUNITY)
Admission: RE | Admit: 2023-01-03 | Discharge: 2023-01-03 | Disposition: A | Discharge status: Home / Self Care | Payer: PPO | Source: Ambulatory Visit | Attending: Cardiovascular Disease

## 2023-01-03 DIAGNOSIS — Z951 Presence of aortocoronary bypass graft: Secondary | ICD-10-CM

## 2023-01-03 DIAGNOSIS — I213 ST elevation (STEMI) myocardial infarction of unspecified site: Secondary | ICD-10-CM

## 2023-01-05 ENCOUNTER — Encounter (HOSPITAL_COMMUNITY)
Admission: RE | Admit: 2023-01-05 | Discharge: 2023-01-05 | Disposition: A | Discharge status: Home / Self Care | Payer: PPO | Source: Ambulatory Visit | Attending: Cardiovascular Disease | Admitting: Cardiovascular Disease

## 2023-01-05 DIAGNOSIS — I213 ST elevation (STEMI) myocardial infarction of unspecified site: Secondary | ICD-10-CM

## 2023-01-05 DIAGNOSIS — Z951 Presence of aortocoronary bypass graft: Secondary | ICD-10-CM

## 2023-01-05 NOTE — Progress Notes (Signed)
Cardiac Individual Treatment Plan  Patient Details  Name: Collin [name] Date of Birth: [birth year] Referring Provider:   Flowsheet Row INTENSIVE CARDIAC REHAB ORIENT from 11/11/2022 in [facility]  Referring Provider Tonny Bollman, MD       Initial Encounter Date:  Flowsheet Row INTENSIVE CARDIAC REHAB ORIENT from 11/11/2022 in [facility], [facility]  Date 11/11/22       Visit Diagnosis: 09/01/22 STEMI  09/01/22 CABG x 3  Patient's Home Medications on Admission:  Current Outpatient Medications:    ascorbic acid (VITAMIN C) 500 MG tablet, Take 0.5 tablets (250 mg total) by mouth daily., Disp: 100 tablet, Rfl: 0   aspirin 81 MG tablet, Take 1 tablet (81 mg total) by mouth at bedtime. (Patient taking differently: Take 81 mg by mouth daily.), Disp: 30 tablet, Rfl:    atorvastatin (LIPITOR) 80 MG tablet, Take 1 tablet (80 mg total) by mouth daily., Disp: 90 tablet, Rfl: 3   Cholecalciferol (VITAMIN D-3) 125 MCG (5000 UT) TABS, Take 125 mcg by mouth daily., Disp: , Rfl:    clopidogrel (PLAVIX) 75 MG tablet, Take 1 tablet (75 mg total) by mouth daily., Disp: 90 tablet, Rfl: 3   Coenzyme Q10 (COQ10 PO), Take 300 mg by mouth daily., Disp: , Rfl:    Glucosamine-Chondroitin 500-400 MG CAPS, Take 3 tablets by mouth daily., Disp: , Rfl:    metoprolol succinate (TOPROL-XL) 25 MG 24 hr tablet, Take 0.5 tablets (12.5 mg total) by mouth at bedtime., Disp: 15 tablet, Rfl: 0   Multiple Vitamins-Minerals (MENS 50+ MULTIVITAMIN) TABS, Take 1 tablet by mouth daily., Disp: , Rfl:    Omega-3 Fatty Acids (FISH OIL PO), Take 1,400 mg by mouth daily., Disp: , Rfl:    Probiotic Product (PROBIOTIC PO), Take 1 tablet by mouth daily., Disp: , Rfl:    tamsulosin (FLOMAX) 0.4 MG CAPS capsule, TAKE 1 CAPSULE(0.4 MG) BY MOUTH DAILY, Disp: 90 capsule, Rfl: 2   traZODone (DESYREL) 50 MG tablet,  Take 0.5 tablets (25 mg total) by mouth at bedtime. (Patient not taking: Reported on 11/15/2022), Disp: 15 tablet, Rfl: 0  Past Medical History: Past Medical History:  Diagnosis Date   Allergy    Arthritis    BPH (benign prostatic hyperplasia)    CAD (coronary artery disease)    Cataract    Diverticulosis of colon    Hx of adenomatous colonic polyps 05/22/2014   Hyperlipidemia    Hypertension    MYOCARDIAL INFARCTION, HX OF 07/20/2006   Qualifier: Diagnosis of  By: Cato Mulligan MD, Bruce      Tobacco Use: Social History   Tobacco Use  Smoking Status Never  Smokeless Tobacco Never    Labs: Review Flowsheet  More data exists      Latest Ref Rng & Units 07/15/2021 01/05/2022 09/01/2022 09/02/2022 09/03/2022  Labs for ITP Cardiac and Pulmonary Rehab  Cholestrol 0 - 200 mg/dL - - 409  - 83   LDL (calc) 0 - 99 mg/dL - - 62  - 31   HDL-C >81 mg/dL - - 59  - 43   Trlycerides <150 mg/dL - - 59  - 47   Hemoglobin A1c 4.8 - 5.6 % 6.3  5.7  6.0  - -  PH, Arterial 7.35 - 7.45 - - 7.388  7.370  7.317  7.355  7.316  7.370  -  PCO2 arterial 32 - 48  mmHg - - 36.1  38.7  45.6  34.6  41.3  31.6  -  Bicarbonate 20.0 - 28.0 mmol/L - - 22.0  23.4  22.4  23.3  19.4  21.2  18.5  -  TCO2 22 - 32 mmol/L - - 23  24  24  25  24  24  27  25  20  20  22  19   -  Acid-base deficit 0.0 - 2.0 mmol/L - - 3.0  2.0  3.0  3.0  6.0  5.0  6.0  -  O2 Saturation % - - 98  83  100  100  98  95  99  -    Details       Multiple values from one day are sorted in reverse-chronological order         Capillary Blood Glucose: Lab Results  Component Value Date   GLUCAP 118 (H) 09/10/2022   GLUCAP 111 (H) 09/10/2022   GLUCAP 132 (H) 09/09/2022   GLUCAP 100 (H) 09/09/2022   GLUCAP 123 (H) 09/05/2022     Exercise Target Goals: Exercise Program Goal: Individual exercise prescription set using results from initial 6 min walk test and THRR while considering  patient's activity barriers and safety.   Exercise  Prescription Goal: Initial exercise prescription builds to 30-45 minutes a day of aerobic activity, 2-3 days per week.  Home exercise guidelines will be given to patient during program as part of exercise prescription that the participant will acknowledge.  Activity Barriers & Risk Stratification:  Activity Barriers & Cardiac Risk Stratification - 11/11/22 1132       Activity Barriers & Cardiac Risk Stratification   Activity Barriers Balance Concerns;Arthritis;Joint Problems;Back Problems;Deconditioning;Assistive Device;Other (comment);Muscular Weakness;Neck/Spine Problems   Pt has drop foot (left)   Cardiac Risk Stratification High             6 Minute Walk:  6 Minute Walk     Row Name 11/11/22 1109         6 Minute Walk   Phase Initial  Pt used go-cart     Distance 999 feet  Pt used Go-cart     Walk Time 6 minutes     # of Rest Breaks 0     MPH 1.9     METS 2     RPE 9     Perceived Dyspnea  0     VO2 Peak 6.9     Symptoms No     Resting HR 59 bpm     Resting BP 124/60     Resting Oxygen Saturation  96 %     Exercise Oxygen Saturation  during 6 min walk 97 %     Max Ex. HR 102 bpm     Max Ex. BP 134/70     2 Minute Post BP 114/66              Oxygen Initial Assessment:   Oxygen Re-Evaluation:   Oxygen Discharge (Final Oxygen Re-Evaluation):   Initial Exercise Prescription:  Initial Exercise Prescription - 11/11/22 1100       Date of Initial Exercise RX and Referring Provider   Date 11/11/22    Referring Provider Tonny Bollman, MD    Expected Discharge Date 02/02/23      NuStep   Level 1    SPM 70    Minutes 25    METs 2      Prescription Details   Frequency (times per week)  3    Duration Progress to 30 minutes of continuous aerobic without signs/symptoms of physical distress      Intensity   THRR 40-80% of Max Heartrate 54-109    Ratings of Perceived Exertion 11-13    Perceived Dyspnea 0-4      Progression   Progression Continue  progressive overload as per policy without signs/symptoms or physical distress.      Resistance Training   Training Prescription Yes    Weight 3 lbs    Reps 10-15             Perform Capillary Blood Glucose checks as needed.  Exercise Prescription Changes:   Exercise Prescription Changes     Row Name 11/15/22 1400 12/08/22 1400 12/22/22 1500         Response to Exercise   Blood Pressure (Admit) 118/70 100/64 120/62     Blood Pressure (Exercise) 128/62 130/62 --     Blood Pressure (Exit) 118/68 102/64 104/66     Heart Rate (Admit) 65 bpm 59 bpm 67 bpm     Heart Rate (Exercise) 91 bpm 108 bpm 87 bpm     Heart Rate (Exit) 58 bpm 67 bpm 66 bpm     Rating of Perceived Exertion (Exercise) 11 9 13      Symptoms None None None     Comments Pt's first day in the CRP2 program Reviewed METs Reviewed METs and goals     Duration Continue with 30 min of aerobic exercise without signs/symptoms of physical distress. Continue with 30 min of aerobic exercise without signs/symptoms of physical distress. Continue with 30 min of aerobic exercise without signs/symptoms of physical distress.     Intensity THRR unchanged THRR unchanged THRR unchanged       Progression   Progression Continue to progress workloads to maintain intensity without signs/symptoms of physical distress. Continue to progress workloads to maintain intensity without signs/symptoms of physical distress. Continue to progress workloads to maintain intensity without signs/symptoms of physical distress.     Average METs 2.4 3.4 4.1       Resistance Training   Training Prescription Yes No No     Weight 3 lbs No weights on Wed. No weights on Wed.     Reps 10-15 -- --     Time 10 Minutes -- --       Interval Training   Interval Training No No No       NuStep   Level 1 3 5      SPM 89 105 102     Minutes 25 30 30      METs 2.4 3.4 4.1              Exercise Comments:   Exercise Comments     Row Name 11/15/22 1420  12/08/22 1417 12/22/22 1500       Exercise Comments Pt's first day in the CRP2 program. Pt had no voiced complaints with exercise today. Reviewed METs. Pt is making progress. Reviewed METs and goals. Peak METs to date are 4.1; Pt is making progress.              Exercise Goals and Review:   Exercise Goals     Row Name 11/11/22 0923             Exercise Goals   Increase Physical Activity Yes       Intervention Provide advice, education, support and counseling about physical activity/exercise needs.;Develop an individualized exercise prescription for aerobic and resistive  training based on initial evaluation findings, risk stratification, comorbidities and participant's personal goals.       Expected Outcomes Short Term: Attend rehab on a regular basis to increase amount of physical activity.;Long Term: Exercising regularly at least 3-5 days a week.;Long Term: Add in home exercise to make exercise part of routine and to increase amount of physical activity.       Increase Strength and Stamina Yes       Intervention Provide advice, education, support and counseling about physical activity/exercise needs.;Develop an individualized exercise prescription for aerobic and resistive training based on initial evaluation findings, risk stratification, comorbidities and participant's personal goals.       Expected Outcomes Short Term: Increase workloads from initial exercise prescription for resistance, speed, and METs.;Short Term: Perform resistance training exercises routinely during rehab and add in resistance training at home;Long Term: Improve cardiorespiratory fitness, muscular endurance and strength as measured by increased METs and functional capacity ( )       Able to understand and use rate of perceived exertion (RPE) scale Yes       Intervention Provide education and explanation on how to use RPE scale       Expected Outcomes Long Term:  Able to use RPE to guide intensity level when  exercising independently;Short Term: Able to use RPE daily in rehab to express subjective intensity level       Knowledge and understanding of Target Heart Rate Range (THRR) Yes       Intervention Provide education and explanation of THRR including how the numbers were predicted and where they are located for reference       Expected Outcomes Short Term: Able to state/look up THRR;Short Term: Able to use daily as guideline for intensity in rehab;Long Term: Able to use THRR to govern intensity when exercising independently       Understanding of Exercise Prescription Yes       Intervention Provide education, explanation, and written materials on patient's individual exercise prescription       Expected Outcomes Short Term: Able to explain program exercise prescription;Long Term: Able to explain home exercise prescription to exercise independently                Exercise Goals Re-Evaluation :  Exercise Goals Re-Evaluation     Row Name 11/15/22 1418 12/22/22 1500           Exercise Goal Re-Evaluation   Exercise Goals Review Increase Physical Activity;Increase Strength and Stamina;Able to understand and use Dyspnea scale;Knowledge and understanding of Target Heart Rate Range (THRR);Understanding of Exercise Prescription Increase Physical Activity;Increase Strength and Stamina;Able to understand and use Dyspnea scale;Knowledge and understanding of Target Heart Rate Range (THRR);Understanding of Exercise Prescription      Comments Pt's first day in the CRP2 program. Pt understands the exercise Rx, RPE scale, and THRR. Reviewed METs and goals. Pt report progress on his goals. Pt has less fatigue and more strength and stamina in his legs, less so in the arms. Pt voices he is walking 2-3x/week for 30 minutes, covering about 1.5 miles.      Expected Outcomes Will monitor patient and progress exercise workloads as tolerated. Will monitor patient and progress exercise workloads as tolerated.                Discharge Exercise Prescription (Final Exercise Prescription Changes):  Exercise Prescription Changes - 12/22/22 1500       Response to Exercise   Blood Pressure (Admit) 120/62  Blood Pressure (Exit) 104/66    Heart Rate (Admit) 67 bpm    Heart Rate (Exercise) 87 bpm    Heart Rate (Exit) 66 bpm    Rating of Perceived Exertion (Exercise) 13    Symptoms None    Comments Reviewed METs and goals    Duration Continue with 30 min of aerobic exercise without signs/symptoms of physical distress.    Intensity THRR unchanged      Progression   Progression Continue to progress workloads to maintain intensity without signs/symptoms of physical distress.    Average METs 4.1      Resistance Training   Training Prescription No    Weight No weights on Wed.      Interval Training   Interval Training No      NuStep   Level 5    SPM 102    Minutes 30    METs 4.1             Nutrition:  Target Goals: Understanding of nutrition guidelines, daily intake of sodium 1500mg , cholesterol 200mg , calories 30% from fat and 7% or less from saturated fats, daily to have 5 or more servings of fruits and vegetables.  Biometrics:   Post Biometrics - 11/11/22 0955        Post  Biometrics   Waist Circumference 40 inches    Hip Circumference 40 inches    Waist to Hip Ratio 1 %    Triceps Skinfold 10 mm    Grip Strength 38 kg    Flexibility --   Not performed due to back problems   Single Leg Stand --   Pt voiced he could not do it            Nutrition Therapy Plan and Nutrition Goals:  Nutrition Therapy & Goals - 12/13/22 1549       Nutrition Therapy   Diet Heart healthy diet    Drug/Food Interactions Statins/Certain Fruits      Personal Nutrition Goals   Nutrition Goal Patient to identify strategies for reducing cardiovascular risk by attending the [facility] education and nutrition series weekly.   goal in progress.   Personal Goal #2 Patient to improve diet quality  by using the plate method as a guide for meal planning to include lean protein/plant protein, fruits, vegetables, whole grains, nonfat dairy as part of a well-balanced diet.   goal in progress.   Personal Goal #3 Patient to limit sodium to 1500mg  per day   goal in progress.   Comments Goals in progress. Sumedh has attended 5 [facility] education classes. Patient has medical history of STEMI, CAD, CABGx3, preDM. His LDL remains at goal. His A1c is in a prediabetic range. He has maintained his weight since starting with our program.  His wife is a good support. Patient will benefit from participation in intensive cardiac rehab for nutrition, exercise, and lifestyle modification.      Intervention Plan   Intervention Prescribe, educate and counsel regarding individualized specific dietary modifications aiming towards targeted core components such as weight, hypertension, lipid management, diabetes, heart failure and other comorbidities.;Nutrition handout(s) given to patient.    Expected Outcomes Short Term Goal: Understand basic principles of dietary content, such as calories, fat, sodium, cholesterol and nutrients.;Long Term Goal: Adherence to prescribed nutrition plan.             Nutrition Assessments:  Nutrition Assessments - 11/16/22 1547       Rate Your Plate Scores   Pre Score 63  MEDIFICTS Score Key: >=70 Need to make dietary changes  40-70 Heart Healthy Diet <= 40 Therapeutic Level Cholesterol Diet   Flowsheet Row INTENSIVE CARDIAC REHAB from 11/15/2022 in [facility]  Picture Your Plate Total Score on Admission 63      Picture Your Plate Scores: <13 Unhealthy dietary pattern with much room for improvement. 41-50 Dietary pattern unlikely to meet recommendations for good health and room for improvement. 51-60 More healthful dietary pattern, with some room for improvement.  >60 Healthy dietary  pattern, although there may be some specific behaviors that could be improved.    Nutrition Goals Re-Evaluation:  Nutrition Goals Re-Evaluation     Row Name 11/15/22 1548 12/13/22 1549           Goals   Current Weight 192 lb 3.9 oz (87.2 kg) 191 lb 9.3 oz (86.9 kg)      Comment Lpa WNL, LDL at goal, A1c 6.0 no new labs; most recent labs  Lpa WNL, LDL at goal, A1c 6.0      Expected Outcome Patient has medical history of STEMI, CAD, CABGx3, preDM. His LDL remains at goal. His A1c is in a prediabetic range. His wife is a good support. Patient will benefit from participation in intensive cardiac rehab for nutrition, exercise, and lifestyle modification. Goals in progress. Collin [name] has attended 5 [facility] education classes. Patient has medical history of STEMI, CAD, CABGx3, preDM. His LDL remains at goal. His A1c is in a prediabetic range. He has maintained his weight since starting with our program. His wife is a good support. Patient will benefit from participation in intensive cardiac rehab for nutrition, exercise, and lifestyle modification.               Nutrition Goals Re-Evaluation:  Nutrition Goals Re-Evaluation     Row Name 11/15/22 1548 12/13/22 1549           Goals   Current Weight 192 lb 3.9 oz (87.2 kg) 191 lb 9.3 oz (86.9 kg)      Comment Lpa WNL, LDL at goal, A1c 6.0 no new labs; most recent labs  Lpa WNL, LDL at goal, A1c 6.0      Expected Outcome Patient has medical history of STEMI, CAD, CABGx3, preDM. His LDL remains at goal. His A1c is in a prediabetic range. His wife is a good support. Patient will benefit from participation in intensive cardiac rehab for nutrition, exercise, and lifestyle modification. Goals in progress. Collin [name] has attended 5 [facility] education classes. Patient has medical history of STEMI, CAD, CABGx3, preDM. His LDL remains at goal. His A1c is in a prediabetic range. He has maintained his weight since starting with our program. His wife is a good  support. Patient will benefit from participation in intensive cardiac rehab for nutrition, exercise, and lifestyle modification.               Nutrition Goals Discharge (Final Nutrition Goals Re-Evaluation):  Nutrition Goals Re-Evaluation - 12/13/22 1549       Goals   Current Weight 191 lb 9.3 oz (86.9 kg)    Comment no new labs; most recent labs  Lpa WNL, LDL at goal, A1c 6.0    Expected Outcome Goals in progress. Collin [name] has attended 5 [facility] education classes. Patient has medical history of STEMI, CAD, CABGx3, preDM. His LDL remains at goal. His A1c is in a prediabetic range. He has maintained his weight since starting with our program.  His wife is a good support. Patient will benefit from participation in intensive cardiac rehab for nutrition, exercise, and lifestyle modification.             Psychosocial: Target Goals: Acknowledge presence or absence of significant depression and/or stress, maximize coping skills, provide positive support system. Participant is able to verbalize types and ability to use techniques and skills needed for reducing stress and depression.  Initial Review & Psychosocial Screening:  Initial Psych Review & Screening - 11/11/22 1014       Initial Review   Current issues with None Identified      Family Dynamics   Good Support System? Yes   Pt has spouse Collin [name], and a daughter ans son her in [place] for support.     Barriers   Psychosocial barriers to participate in program There are no identifiable barriers or psychosocial needs.      Screening Interventions   Interventions Encouraged to exercise             Quality of Life Scores:  Quality of Life - 11/11/22 1232       Quality of Life   Select Quality of Life      Quality of Life Scores   Health/Function Pre 19.37 %    Socioeconomic Pre 22.57 %    Psych/Spiritual Pre 22.07 %    Family Pre 26.4 %    GLOBAL Pre 21.62 %            Scores of 19 and below usually indicate a  poorer quality of life in these areas.  A difference of  2-3 points is a clinically meaningful difference.  A difference of 2-3 points in the total score of the Quality of Life Index has been associated with significant improvement in overall quality of life, self-image, physical symptoms, and general health in studies assessing change in quality of life.  PHQ-9: Review Flowsheet  More data exists      11/11/2022 03/04/2022 01/05/2022 07/15/2021 03/03/2021  Depression screen PHQ 2/9  Decreased Interest 0 0 0 0 0  Down, Depressed, Hopeless 0 0 0 0 0  PHQ - 2 Score 0 0 0 0 0  Altered sleeping 1 0 - - -  Tired, decreased energy 1 0 - - -  Change in appetite 0 0 - - -  Feeling bad or failure about yourself  0 0 - - -  Trouble concentrating 0 1 - - -  Moving slowly or fidgety/restless 1 0 - - -  Suicidal thoughts 0 0 - - -  PHQ-9 Score 3 1 - - -  Difficult doing work/chores Not difficult at all Not difficult at all - - -    Details           Interpretation of Total Score  Total Score Depression Severity:  1-4 = Minimal depression, 5-9 = Mild depression, 10-14 = Moderate depression, 15-19 = Moderately severe depression, 20-27 = Severe depression   Psychosocial Evaluation and Intervention:   Psychosocial Re-Evaluation:  Psychosocial Re-Evaluation     Row Name 11/15/22 1658 12/14/22 1325 01/05/23 1729         Psychosocial Re-Evaluation   Current issues with None Identified None Identified None Identified     Comments Collin [name] did not voice any concerns or stressors on his first day of exercise. Will review PHq2-9 and quality of life questionnaire in the upcoming week Reviewed quality of life questionnarie. Denies being depressed. --     Interventions Stress  management education;Encouraged to attend Cardiac Rehabilitation for the exercise;Relaxation education Encouraged to attend Cardiac Rehabilitation for the exercise Encouraged to attend Cardiac Rehabilitation for the exercise      Continue Psychosocial Services  Follow up required by staff No Follow up required No Follow up required              Psychosocial Discharge (Final Psychosocial Re-Evaluation):  Psychosocial Re-Evaluation - 01/05/23 1729       Psychosocial Re-Evaluation   Current issues with None Identified    Interventions Encouraged to attend Cardiac Rehabilitation for the exercise    Continue Psychosocial Services  No Follow up required             Vocational Rehabilitation: Provide vocational rehab assistance to qualifying candidates.   Vocational Rehab Evaluation & Intervention:  Vocational Rehab - 11/11/22 1015       Initial Vocational Rehab Evaluation & Intervention   Assessment shows need for Vocational Rehabilitation No   Pt is retired            Education: Education Goals: Education classes will be provided on a weekly basis, covering required topics. Participant will state understanding/return demonstration of topics presented.    Education     Row Name 11/15/22 1500     Education   Cardiac Education Topics [facility]   Glass blower/designer Nutrition   Nutrition Workshop Fueling a Forensic psychologist   Instruction Review Code 1- Teaching laboratory technician Start Time 1400   Class Stop Time 1445   Class Time Calculation (min) 45 min    Row Name 11/17/22 1500     Education   Cardiac Education Topics [employer]   Customer service manager   Weekly Topic International Cuisine- Spotlight on the [employer] Zones   Instruction Review Code 1- Verbalizes Understanding   Class Start Time 1400   Class Stop Time 1440   Class Time Calculation (min) 40 min    Row Name 11/19/22 1400     Education   Cardiac Education Topics [facility]   Psychologist, forensic Exercise Education   Exercise Education Improving Performance   Class Start Time 1355    Class Stop Time 1433   Class Time Calculation (min) 38 min    Row Name 11/22/22 1600     Education   Cardiac Education Topics [facility]   Geographical information systems officer Psychosocial   Psychosocial Workshop Healthy Sleep for a Healthy Heart   Instruction Review Code 1- Verbalizes Understanding   Class Start Time 1400   Class Stop Time 1450   Class Time Calculation (min) 50 min    Row Name 11/26/22 1400     Education   Cardiac Education Topics [facility]   Nurse, children's Exercise Physiologist   Select Psychosocial   Psychosocial How Our Thoughts Can Heal Our Hearts   Instruction Review Code 1- Verbalizes Understanding   Class Start Time 1355   Class Stop Time 1428   Class Time Calculation (min) 33 min    Row Name 12/13/22 1500     Education   Cardiac Education Topics [facility]   Public librarian  Select Exercise   Exercise Workshop Location manager and Fall Prevention   Instruction Review Code 1- Verbalizes Understanding   Class Start Time 1406   Class Stop Time 1500   Class Time Calculation (min) 54 min    Row Name 12/15/22 1300     Education   Cardiac Education Topics [facility]in   Customer service manager   Weekly Topic Fast and Healthy Breakfasts   Instruction Review Code 1- Verbalizes Understanding   Class Start Time 1355   Class Stop Time 1435   Class Time Calculation (min) 40 min    Row Name 12/17/22 1500     Education   Cardiac Education Topics Pritikin   Licensed conveyancer Nutrition   Nutrition Overview of the Pritikin Eating Plan   Instruction Review Code 1- Verbalizes Understanding   Class Start Time 1400   Class Stop Time 1445   Class Time Calculation (min) 45 min    Row Name 12/20/22 1400     Education   Cardiac Education Topics  Pritikin   Nurse, children's Exercise Physiologist   Select Psychosocial   Psychosocial Healthy Minds, Bodies, Hearts   Instruction Review Code 1- Verbalizes Understanding   Class Start Time 1357   Class Stop Time 1429   Class Time Calculation (min) 32 min    Row Name 12/22/22 1400     Education   Cardiac Education Topics Pritikin   Nurse, children's Nurse   Select Nutrition   Instruction Review Code 1- Verbalizes Understanding   Class Start Time 1356    Row Name 12/27/22 1400     Education   Cardiac Education Topics Pritikin   Select Core Videos     Core Videos   Educator Exercise Physiologist   Select Nutrition   Nutrition Other  Label Reading   Instruction Review Code 1- Verbalizes Understanding   Class Start Time 1356   Class Stop Time 1430   Class Time Calculation (min) 34 min    Row Name 12/29/22 1500     Education   Cardiac Education Topics --   Select --     Hospital doctor --   Weekly Topic --   Instruction Review Code --   Class Start Time --   Class Stop Time --   Class Time Calculation (min) --    Row Name 12/31/22 1500     Education   Cardiac Education Topics Pritikin   Glass blower/designer Nutrition   Nutrition Workshop Label Reading   Instruction Review Code 1- Verbalizes Understanding   Class Start Time 1401   Class Stop Time 1433   Class Time Calculation (min) 32 min    Row Name 01/03/23 1600     Education   Cardiac Education Topics Pritikin   Select Workshops     Workshops   Educator Exercise Physiologist   Select Psychosocial   Psychosocial Workshop Recognizing and Reducing Stress   Instruction Review Code 1- Verbalizes Understanding   Class Start Time 1359   Class Stop Time 1445   Class Time Calculation (min) 46 min            Core Videos: Exercise  Move It!  Clinical staff conducted group or  individual video education with verbal and written material and guidebook.  Patient learns the recommended Pritikin exercise program. Exercise with the goal of living a long, healthy life. Some of the health benefits of exercise include controlled diabetes, healthier blood pressure levels, improved cholesterol levels, improved heart and lung capacity, improved sleep, and better body composition. Everyone should speak with their doctor before starting or changing an exercise routine.  Biomechanical Limitations Clinical staff conducted group or individual video education with verbal and written material and guidebook.  Patient learns how biomechanical limitations can impact exercise and how we can mitigate and possibly overcome limitations to have an impactful and balanced exercise routine.  Body Composition Clinical staff conducted group or individual video education with verbal and written material and guidebook.  Patient learns that body composition (ratio of muscle mass to fat mass) is a key component to assessing overall fitness, rather than body weight alone. Increased fat mass, especially visceral belly fat, can put [place] at increased risk for metabolic syndrome, type 2 diabetes, heart disease, and even death. It is recommended to combine diet and exercise (cardiovascular and resistance training) to improve your body composition. Seek guidance from your physician and exercise physiologist before implementing an exercise routine.  Exercise Action Plan Clinical staff conducted group or individual video education with verbal and written material and guidebook.  Patient learns the recommended strategies to achieve and enjoy long-term exercise adherence, including variety, self-motivation, self-efficacy, and positive decision making. Benefits of exercise include fitness, good health, weight management, more energy, better sleep, less stress, and overall well-being.  Medical   Heart Disease Risk  Reduction Clinical staff conducted group or individual video education with verbal and written material and guidebook.  Patient learns our heart is our most vital organ as it circulates oxygen, nutrients, white blood cells, and hormones throughout the entire body, and carries waste away. Data supports a plant-based eating plan like the Pritikin Program for its effectiveness in slowing progression of and reversing heart disease. The video provides a number of recommendations to address heart disease.   Metabolic Syndrome and Belly Fat  Clinical staff conducted group or individual video education with verbal and written material and guidebook.  Patient learns what metabolic syndrome is, how it leads to heart disease, and how one can reverse it and keep it from coming back. You have metabolic syndrome if you have 3 of the following 5 criteria: abdominal obesity, high blood pressure, high triglycerides, low HDL cholesterol, and high blood sugar.  Hypertension and Heart Disease Clinical staff conducted group or individual video education with verbal and written material and guidebook.  Patient learns that high blood pressure, or hypertension, is very common in the [place]. Hypertension is largely due to excessive salt intake, but other important risk factors include being overweight, physical inactivity, drinking too much alcohol, smoking, and not eating enough potassium from fruits and vegetables. High blood pressure is a leading risk factor for heart attack, stroke, congestive heart failure, dementia, kidney failure, and premature death. Long-term effects of excessive salt intake include stiffening of the arteries and thickening of heart muscle and organ damage. Recommendations include ways to reduce hypertension and the risk of heart disease.  Diseases of Our Time - Focusing on Diabetes Clinical staff conducted group or individual video education with verbal and written material and guidebook.   Patient learns why the best way to stop diseases of our time is prevention, through food and  other lifestyle changes. Medicine (such as prescription pills and surgeries) is often only a Band-Aid on the problem, not a long-term solution. Most common diseases of our time include obesity, type 2 diabetes, hypertension, heart disease, and cancer. The Pritikin Program is recommended and has been proven to help reduce, reverse, and/or prevent the damaging effects of metabolic syndrome.  Nutrition   Overview of the Pritikin Eating Plan  Clinical staff conducted group or individual video education with verbal and written material and guidebook.  Patient learns about the Pritikin Eating Plan for disease risk reduction. The Pritikin Eating Plan emphasizes a wide variety of unrefined, minimally-processed carbohydrates, like fruits, vegetables, whole grains, and legumes. Go, Caution, and Stop food choices are explained. Plant-based and lean animal proteins are emphasized. Rationale provided for low sodium intake for blood pressure control, low added sugars for blood sugar stabilization, and low added fats and oils for coronary artery disease risk reduction and weight management.  Calorie Density  Clinical staff conducted group or individual video education with verbal and written material and guidebook.  Patient learns about calorie density and how it impacts the Pritikin Eating Plan. Knowing the characteristics of the food you choose will help you decide whether those foods will lead to weight gain or weight loss, and whether you want to consume more or less of them. Weight loss is usually a side effect of the Pritikin Eating Plan because of its focus on low calorie-dense foods.  Label Reading  Clinical staff conducted group or individual video education with verbal and written material and guidebook.  Patient learns about the Pritikin recommended label reading guidelines and corresponding recommendations  regarding calorie density, added sugars, sodium content, and whole grains.  Dining Out - Part 1  Clinical staff conducted group or individual video education with verbal and written material and guidebook.  Patient learns that restaurant meals can be sabotaging because they can be so high in calories, fat, sodium, and/or sugar. Patient learns recommended strategies on how to positively address this and avoid unhealthy pitfalls.  Facts on Fats  Clinical staff conducted group or individual video education with verbal and written material and guidebook.  Patient learns that lifestyle modifications can be just as effective, if not more so, as many medications for lowering your risk of heart disease. A Pritikin lifestyle can help to reduce your risk of inflammation and atherosclerosis (cholesterol build-up, or plaque, in the artery walls). Lifestyle interventions such as dietary choices and physical activity address the cause of atherosclerosis. A review of the types of fats and their impact on blood cholesterol levels, along with dietary recommendations to reduce fat intake is also included.  Nutrition Action Plan  Clinical staff conducted group or individual video education with verbal and written material and guidebook.  Patient learns how to incorporate Pritikin recommendations into their lifestyle. Recommendations include planning and keeping personal health goals in mind as an important part of their success.  Healthy Mind-Set    Healthy Minds, Bodies, Hearts  Clinical staff conducted group or individual video education with verbal and written material and guidebook.  Patient learns how to identify when they are stressed. Video will discuss the impact of that stress, as well as the many benefits of stress management. Patient will also be introduced to stress management techniques. The way we think, act, and feel has an impact on our hearts.  How Our Thoughts Can Heal Our Hearts  Clinical staff  conducted group or individual video education with verbal and written  material and guidebook.  Patient learns that negative thoughts can cause depression and anxiety. This can result in negative lifestyle behavior and serious health problems. Cognitive behavioral therapy is an effective method to help control our thoughts in order to change and improve our emotional outlook.  Additional Videos:  Exercise    Improving Performance  Clinical staff conducted group or individual video education with verbal and written material and guidebook.  Patient learns to use a non-linear approach by alternating intensity levels and lengths of time spent exercising to help burn more calories and lose more body fat. Cardiovascular exercise helps improve heart health, metabolism, hormonal balance, blood sugar control, and recovery from fatigue. Resistance training improves strength, endurance, balance, coordination, reaction time, metabolism, and muscle mass. Flexibility exercise improves circulation, posture, and balance. Seek guidance from your physician and exercise physiologist before implementing an exercise routine and learn your capabilities and proper form for all exercise.  Introduction to Yoga  Clinical staff conducted group or individual video education with verbal and written material and guidebook.  Patient learns about yoga, a discipline of the coming together of mind, breath, and body. The benefits of yoga include improved flexibility, improved range of motion, better posture and core strength, increased lung function, weight loss, and positive self-image. Yoga's heart health benefits include lowered blood pressure, healthier heart rate, decreased cholesterol and triglyceride levels, improved immune function, and reduced stress. Seek guidance from your physician and exercise physiologist before implementing an exercise routine and learn your capabilities and proper form for all exercise.  Medical   Aging:  Enhancing Your Quality of Life  Clinical staff conducted group or individual video education with verbal and written material and guidebook.  Patient learns key strategies and recommendations to stay in good physical health and enhance quality of life, such as prevention strategies, having an advocate, securing a Health Care Proxy and Power of Attorney, and keeping a list of medications and system for tracking them. It also discusses how to avoid risk for bone loss.  Biology of Weight Control  Clinical staff conducted group or individual video education with verbal and written material and guidebook.  Patient learns that weight gain occurs because we consume more calories than we burn (eating more, moving less). Even if your body weight is normal, you may have higher ratios of fat compared to muscle mass. Too much body fat puts you at increased risk for cardiovascular disease, heart attack, stroke, type 2 diabetes, and obesity-related cancers. In addition to exercise, following the Pritikin Eating Plan can help reduce your risk.  Decoding Lab Results  Clinical staff conducted group or individual video education with verbal and written material and guidebook.  Patient learns that lab test reflects one measurement whose values change over time and are influenced by many factors, including medication, stress, sleep, exercise, food, hydration, pre-existing medical conditions, and more. It is recommended to use the knowledge from this video to become more involved with your lab results and evaluate your numbers to speak with your doctor.   Diseases of Our Time - Overview  Clinical staff conducted group or individual video education with verbal and written material and guidebook.  Patient learns that according to the CDC, 50% to 70% of chronic diseases (such as obesity, type 2 diabetes, elevated lipids, hypertension, and heart disease) are avoidable through lifestyle improvements including healthier food  choices, listening to satiety cues, and increased physical activity.  Sleep Disorders Clinical staff conducted group or individual video education with verbal and  written material and guidebook.  Patient learns how good quality and duration of sleep are important to overall health and well-being. Patient also learns about sleep disorders and how they impact health along with recommendations to address them, including discussing with a physician.  Nutrition  Dining Out - Part 2 Clinical staff conducted group or individual video education with verbal and written material and guidebook.  Patient learns how to plan ahead and communicate in order to maximize their dining experience in a healthy and nutritious manner. Included are recommended food choices based on the type of restaurant the patient is visiting.   Fueling a Banker conducted group or individual video education with verbal and written material and guidebook.  There is a strong connection between our food choices and our health. Diseases like obesity and type 2 diabetes are very prevalent and are in large-part due to lifestyle choices. The Pritikin Eating Plan provides plenty of food and hunger-curbing satisfaction. It is easy to follow, affordable, and helps reduce health risks.  Menu Workshop  Clinical staff conducted group or individual video education with verbal and written material and guidebook.  Patient learns that restaurant meals can sabotage health goals because they are often packed with calories, fat, sodium, and sugar. Recommendations include strategies to plan ahead and to communicate with the manager, chef, or server to help order a healthier meal.  Planning Your Eating Strategy  Clinical staff conducted group or individual video education with verbal and written material and guidebook.  Patient learns about the Pritikin Eating Plan and its benefit of reducing the risk of disease. The Pritikin Eating  Plan does not focus on calories. Instead, it emphasizes high-quality, nutrient-rich foods. By knowing the characteristics of the foods, we choose, we can determine their calorie density and make informed decisions.  Targeting Your Nutrition Priorities  Clinical staff conducted group or individual video education with verbal and written material and guidebook.  Patient learns that lifestyle habits have a tremendous impact on disease risk and progression. This video provides eating and physical activity recommendations based on your personal health goals, such as reducing LDL cholesterol, losing weight, preventing or controlling type 2 diabetes, and reducing high blood pressure.  Vitamins and Minerals  Clinical staff conducted group or individual video education with verbal and written material and guidebook.  Patient learns different ways to obtain key vitamins and minerals, including through a recommended healthy diet. It is important to discuss all supplements you take with your doctor.   Healthy Mind-Set    Smoking Cessation  Clinical staff conducted group or individual video education with verbal and written material and guidebook.  Patient learns that cigarette smoking and tobacco addiction pose a serious health risk which affects millions of people. Stopping smoking will significantly reduce the risk of heart disease, lung disease, and many forms of cancer. Recommended strategies for quitting are covered, including working with your doctor to develop a successful plan.  Culinary   Becoming a Set designer conducted group or individual video education with verbal and written material and guidebook.  Patient learns that cooking at home can be healthy, cost-effective, quick, and puts them in control. Keys to cooking healthy recipes will include looking at your recipe, assessing your equipment needs, planning ahead, making it simple, choosing cost-effective seasonal ingredients,  and limiting the use of added fats, salts, and sugars.  Cooking - Breakfast and Snacks  Clinical staff conducted group or individual video education with verbal and  written material and guidebook.  Patient learns how important breakfast is to satiety and nutrition through the entire day. Recommendations include key foods to eat during breakfast to help stabilize blood sugar levels and to prevent overeating at meals later in the day. Planning ahead is also a key component.  Cooking - Educational psychologist conducted group or individual video education with verbal and written material and guidebook.  Patient learns eating strategies to improve overall health, including an approach to cook more at home. Recommendations include thinking of animal protein as a side on your plate rather than center stage and focusing instead on lower calorie dense options like vegetables, fruits, whole grains, and plant-based proteins, such as beans. Making sauces in large quantities to freeze for later and leaving the skin on your vegetables are also recommended to maximize your experience.  Cooking - Healthy Salads and Dressing Clinical staff conducted group or individual video education with verbal and written material and guidebook.  Patient learns that vegetables, fruits, whole grains, and legumes are the foundations of the Pritikin Eating Plan. Recommendations include how to incorporate each of these in flavorful and healthy salads, and how to create homemade salad dressings. Proper handling of ingredients is also covered. Cooking - Soups and [employer] - Soups and Desserts Clinical staff conducted group or individual video education with verbal and written material and guidebook.  Patient learns that Pritikin soups and desserts make for easy, nutritious, and delicious snacks and meal components that are low in sodium, fat, sugar, and calorie density, while high in vitamins, minerals, and filling  fiber. Recommendations include simple and healthy ideas for soups and desserts.   Overview     The Pritikin Solution Program Overview Clinical staff conducted group or individual video education with verbal and written material and guidebook.  Patient learns that the results of the Pritikin Program have been documented in more than 100 articles published in peer-reviewed journals, and the benefits include reducing risk factors for (and, in some cases, even reversing) high cholesterol, high blood pressure, type 2 diabetes, obesity, and more! An overview of the three key pillars of the Pritikin Program will be covered: eating well, doing regular exercise, and having a healthy mind-set.  WORKSHOPS  Exercise: Exercise Basics: Building Your Action Plan Clinical staff led group instruction and group discussion with PowerPoint presentation and patient guidebook. To enhance the learning environment the use of posters, models and videos may be added. At the conclusion of this workshop, patients will comprehend the difference between physical activity and exercise, as well as the benefits of incorporating both, into their routine. Patients will understand the FITT (Frequency, Intensity, Time, and Type) principle and how to use it to build an exercise action plan. In addition, safety concerns and other considerations for exercise and cardiac rehab will be addressed by the presenter. The purpose of this lesson is to promote a comprehensive and effective weekly exercise routine in order to improve patients' overall level of fitness.   Managing Heart Disease: Your Path to a Healthier Heart Clinical staff led group instruction and group discussion with PowerPoint presentation and patient guidebook. To enhance the learning environment the use of posters, models and videos may be added.At the conclusion of this workshop, patients will understand the anatomy and physiology of the heart. Additionally, they will  understand how Pritikin's three pillars impact the risk factors, the progression, and the management of heart disease.  The purpose of this lesson is to provide  a high-level overview of the heart, heart disease, and how the Pritikin lifestyle positively impacts risk factors.  Exercise Biomechanics Clinical staff led group instruction and group discussion with PowerPoint presentation and patient guidebook. To enhance the learning environment the use of posters, models and videos may be added. Patients will learn how the structural parts of their bodies function and how these functions impact their daily activities, movement, and exercise. Patients will learn how to promote a neutral spine, learn how to manage pain, and identify ways to improve their physical movement in order to promote healthy living. The purpose of this lesson is to expose patients to common physical limitations that impact physical activity. Participants will learn practical ways to adapt and manage aches and pains, and to minimize their effect on regular exercise. Patients will learn how to maintain good posture while sitting, walking, and lifting.  Balance Training and Fall Prevention  Clinical staff led group instruction and group discussion with PowerPoint presentation and patient guidebook. To enhance the learning environment the use of posters, models and videos may be added. At the conclusion of this workshop, patients will understand the importance of their sensorimotor skills (vision, proprioception, and the vestibular system) in maintaining their ability to balance as they age. Patients will apply a variety of balancing exercises that are appropriate for their current level of function. Patients will understand the common causes for poor balance, possible solutions to these problems, and ways to modify their physical environment in order to minimize their fall risk. The purpose of this lesson is to teach patients  about the importance of maintaining balance as they age and ways to minimize their risk of falling.  WORKSHOPS   Nutrition:  Fueling a Ship broker led group instruction and group discussion with PowerPoint presentation and patient guidebook. To enhance the learning environment the use of posters, models and videos may be added. Patients will review the foundational principles of the Pritikin Eating Plan and understand what constitutes a serving size in each of the food groups. Patients will also learn Pritikin-friendly foods that are better choices when away from home and review make-ahead meal and snack options. Calorie density will be reviewed and applied to three nutrition priorities: weight maintenance, weight loss, and weight gain. The purpose of this lesson is to reinforce (in a group setting) the key concepts around what patients are recommended to eat and how to apply these guidelines when away from home by planning and selecting Pritikin-friendly options. Patients will understand how calorie density may be adjusted for different weight management goals.  Mindful Eating  Clinical staff led group instruction and group discussion with PowerPoint presentation and patient guidebook. To enhance the learning environment the use of posters, models and videos may be added. Patients will briefly review the concepts of the Pritikin Eating Plan and the importance of low-calorie dense foods. The concept of mindful eating will be introduced as well as the importance of paying attention to internal hunger signals. Triggers for non-hunger eating and techniques for dealing with triggers will be explored. The purpose of this lesson is to provide patients with the opportunity to review the basic principles of the Pritikin Eating Plan, discuss the value of eating mindfully and how to measure internal cues of hunger and fullness using the Hunger Scale. Patients will also discuss reasons for non-hunger  eating and learn strategies to use for controlling emotional eating.  Targeting Your Nutrition Priorities Clinical staff led group instruction and group discussion with PowerPoint  presentation and patient guidebook. To enhance the learning environment the use of posters, models and videos may be added. Patients will learn how to determine their genetic susceptibility to disease by reviewing their family history. Patients will gain insight into the importance of diet as part of an overall healthy lifestyle in mitigating the impact of genetics and other environmental insults. The purpose of this lesson is to provide patients with the opportunity to assess their personal nutrition priorities by looking at their family history, their own health history and current risk factors. Patients will also be able to discuss ways of prioritizing and modifying the Pritikin Eating Plan for their highest risk areas  Menu  Clinical staff led group instruction and group discussion with PowerPoint presentation and patient guidebook. To enhance the learning environment the use of posters, models and videos may be added. Using menus brought in from [employer], or printed from [employer], patients will apply the Pritikin dining out guidelines that were presented in the [employer] video. Patients will also be able to practice these guidelines in a variety of provided scenarios. The purpose of this lesson is to provide patients with the opportunity to practice hands-on learning of the Pritikin Dining Out guidelines with actual menus and practice scenarios.  Label Reading Clinical staff led group instruction and group discussion with PowerPoint presentation and patient guidebook. To enhance the learning environment the use of posters, models and videos may be added. Patients will review and discuss the Pritikin label reading guidelines presented in [employer]'s Label Reading Educational series video.  Using fool labels brought in from local grocery stores and markets, patients will apply the label reading guidelines and determine if the packaged food meet the Pritikin guidelines. The purpose of this lesson is to provide patients with the opportunity to review, discuss, and practice hands-on learning of the Pritikin Label Reading guidelines with actual packaged food labels. Cooking School  [employer]'s [employer] are designed to teach patients ways to prepare quick, simple, and affordable recipes at home. The importance of nutrition's role in chronic disease risk reduction is reflected in its emphasis in the overall Pritikin program. By learning how to prepare essential core Pritikin Eating Plan recipes, patients will increase control over what they eat; be able to customize the flavor of foods without the use of added salt, sugar, or fat; and improve the quality of the food they consume. By learning a set of core recipes which are easily assembled, quickly prepared, and affordable, patients are more likely to prepare more healthy foods at home. These workshops focus on convenient breakfasts, simple entres, side dishes, and desserts which can be prepared with minimal effort and are consistent with nutrition recommendations for cardiovascular risk reduction. Cooking [employer] are taught by a Armed forces logistics/support/administrative officer (RD) who has been trained by the [employer]. The chef or RD has a clear understanding of the importance of minimizing - if not completely eliminating - added fat, sugar, and sodium in recipes. Throughout the series of Cooking School Workshop sessions, patients will learn about healthy ingredients and efficient methods of cooking to build confidence in their capability to prepare    Cooking School weekly topics:  Adding Flavor- Sodium-Free  Fast and Healthy Breakfasts  Powerhouse Plant-Based Proteins  Satisfying Salads and Dressings  Simple Sides and  Sauces  International Cuisine-Spotlight on the [employer] Zones  Delicious Desserts  Savory Soups  [employer] - Meals in a Astronomer Appetizers  and Snacks  Comforting Weekend Breakfasts  One-Pot Wonders   Fast [employer] Your Pritikin Plate  WORKSHOPS   Healthy Mindset (Psychosocial):  Focused Goals, Sustainable Changes Clinical staff led group instruction and group discussion with PowerPoint presentation and patient guidebook. To enhance the learning environment the use of posters, models and videos may be added. Patients will be able to apply effective goal setting strategies to establish at least one personal goal, and then take consistent, meaningful action toward that goal. They will learn to identify common barriers to achieving personal goals and develop strategies to overcome them. Patients will also gain an understanding of how our mind-set can impact our ability to achieve goals and the importance of cultivating a positive and growth-oriented mind-set. The purpose of this lesson is to provide patients with a deeper understanding of how to set and achieve personal goals, as well as the tools and strategies needed to overcome common obstacles which may arise along the way.  From Head to Heart: The Power of a Healthy Outlook  Clinical staff led group instruction and group discussion with PowerPoint presentation and patient guidebook. To enhance the learning environment the use of posters, models and videos may be added. Patients will be able to recognize and describe the impact of emotions and mood on physical health. They will discover the importance of self-care and explore self-care practices which may work for them. Patients will also learn how to utilize the 4 C's to cultivate a healthier outlook and better manage stress and challenges. The purpose of this lesson is to demonstrate to patients how a healthy outlook is an essential part of  maintaining good health, especially as they continue their cardiac rehab journey.  Healthy Sleep for a Healthy Heart Clinical staff led group instruction and group discussion with PowerPoint presentation and patient guidebook. To enhance the learning environment the use of posters, models and videos may be added. At the conclusion of this workshop, patients will be able to demonstrate knowledge of the importance of sleep to overall health, well-being, and quality of life. They will understand the symptoms of, and treatments for, common sleep disorders. Patients will also be able to identify daytime and nighttime behaviors which impact sleep, and they will be able to apply these tools to help manage sleep-related challenges. The purpose of this lesson is to provide patients with a general overview of sleep and outline the importance of quality sleep. Patients will learn about a few of the most common sleep disorders. Patients will also be introduced to the concept of "sleep hygiene," and discover ways to self-manage certain sleeping problems through simple daily behavior changes. Finally, the workshop will motivate patients by clarifying the links between quality sleep and their goals of heart-healthy living.   Recognizing and Reducing Stress Clinical staff led group instruction and group discussion with PowerPoint presentation and patient guidebook. To enhance the learning environment the use of posters, models and videos may be added. At the conclusion of this workshop, patients will be able to understand the types of stress reactions, differentiate between acute and chronic stress, and recognize the impact that chronic stress has on their health. They will also be able to apply different coping mechanisms, such as reframing negative self-talk. Patients will have the opportunity to practice a variety of stress management techniques, such as deep abdominal breathing, progressive muscle relaxation, and/or  guided imagery.  The purpose of this lesson is to educate patients on the role of  stress in their lives and to provide healthy techniques for coping with it.  Learning Barriers/Preferences:  Learning Barriers/Preferences - 11/11/22 1147       Learning Barriers/Preferences   Learning Barriers Sight;Hearing   Wears reading glosses and bilateral hearing aids   Learning Preferences Video;Pictoral;Computer/Internet             Education Topics:  Knowledge Questionnaire Score:  Knowledge Questionnaire Score - 11/11/22 1233       Knowledge Questionnaire Score   Pre Score 20/24             Core Components/Risk Factors/Patient Goals at Admission:  Personal Goals and Risk Factors at Admission - 11/11/22 1147       Core Components/Risk Factors/Patient Goals on Admission   Hypertension Yes    Intervention Provide education on lifestyle modifcations including regular physical activity/exercise, weight management, moderate sodium restriction and increased consumption of fresh fruit, vegetables, and low fat dairy, alcohol moderation, and smoking cessation.;Monitor prescription use compliance.    Expected Outcomes Short Term: Continued assessment and intervention until BP is < 140/43mm HG in hypertensive participants. < 130/73mm HG in hypertensive participants with diabetes, heart failure or chronic kidney disease.;Long Term: Maintenance of blood pressure at goal levels.    Lipids Yes    Intervention Provide education and support for participant on nutrition & aerobic/resistive exercise along with prescribed medications to achieve LDL 70mg , HDL >40mg .    Expected Outcomes Short Term: Participant states understanding of desired cholesterol values and is compliant with medications prescribed. Participant is following exercise prescription and nutrition guidelines.;Long Term: Cholesterol controlled with medications as prescribed, with individualized exercise RX and with personalized nutrition  plan. Value goals: LDL < 70mg , HDL > 40 mg.             Core Components/Risk Factors/Patient Goals Review:   Goals and Risk Factor Review     Row Name 11/15/22 1701 12/14/22 1326 01/05/23 1729         Core Components/Risk Factors/Patient Goals Review   Personal Goals Review Weight Management/Obesity;Hypertension;Lipids Weight Management/Obesity;Hypertension;Lipids Weight Management/Obesity;Hypertension;Lipids     Review Collin [name] started cardiac rehab on 11/15/22. Collin [name] did well with exercise for his fitness level. Collin [name] is noted to be somewhat unsteady. Uses a cane for stability. Vital signs were stable. Collin [name] is doing well with exercise at cardiac rehab.  Vital signs have been stable. Collin [name] has increased his met level since starting the program. Collin [name]  continues to do well with exercise at cardiac rehab.  Vital signs have been stable. Collin [name] has increased his met level since starting the program.     Expected Outcomes Collin [name] will continue to participate in cardiac rehab for exercise, nutrition and lifestyle modifications Collin [name] will continue to participate in cardiac rehab for exercise, nutrition and lifestyle modifications Collin [name] will continue to participate in cardiac rehab for exercise, nutrition and lifestyle modifications              Core Components/Risk Factors/Patient Goals at Discharge (Final Review):   Goals and Risk Factor Review - 01/05/23 1729       Core Components/Risk Factors/Patient Goals Review   Personal Goals Review Weight Management/Obesity;Hypertension;Lipids    Review Collin [name]  continues to do well with exercise at cardiac rehab.  Vital signs have been stable. Collin [name] has increased his met level since starting the program.    Expected Outcomes Collin [name] will continue to participate in cardiac rehab for exercise, nutrition and lifestyle modifications  ITP Comments:  ITP Comments     Row Name 11/11/22 4098 11/15/22 1656 12/14/22 1323 01/05/23 1728     ITP  Comments Dr. Armanda Magic medical director. Introduction to pritikin education/intensive cardiac rehab. Initial orientation packet reviewed with patient. 30 Day ITP Review. Collin [name] started cardiac rehab on 11/15/22, "Collin [name]" did well with exercise for his fitness level 30 Day ITP Review. Collin [name] has good attendance and participation in  cardiac rehab 30 Day ITP Review. Collin [name] continues to have  good attendance and participation in  cardiac rehab             Comments: See ITP comments.Thayer Headings RN BSN

## 2023-01-05 NOTE — Progress Notes (Signed)
HPI:  Collin [name] is a 82 y.o.male with a PMHx significant for hearing loss, STEMI, CAD ,s/p angioplasty, unstable gait, foot drop, peripheral neuropathy, OA, BPH, lumbar radiculopathy s/p, lumbar laminectomy, and dyslipidemia, who is here today for his routine physical examination.  Last CPE: 01/05/2022 Since his last visit he has seen his cardiologist, he underwent emergency CABG in 08/2022. Postoperative course was complicated by confusion and AKI as well as atrial fibrillation with RVR. Last follow up with Dr Excell Seltzer on 11/05/22. He has being doing cardiac rehab.  Exercise: Patient states he is going for a walk 3x per week. He also goes to cardiac rehab 3x per week.  Diet: He says he is eating healthy in general and trying to eat vegetables daily.  Sleep: He reports ~8 hours of sleep per night. Mentions frequent nocturia.  Alcohol Use: He occasionally has a glass of wine.  Smoking: never Vision: UTD on routine vision care.  Dental: UTD on routine dental care.   Immunization History  Administered Date(s) Administered   Fluad Quad(high Dose 65+) 11/05/2019   Influenza Whole 12/04/2008, 01/20/2010, 07/26/2010   Influenza, High Dose Seasonal PF 10/16/2015, 11/17/2016, 11/02/2017   Influenza,inj,Quad PF,6+ Mos 11/20/2012, 11/20/2013, 10/08/2014   Influenza-Unspecified 11/17/2016, 10/17/2020, 10/25/2021   PFIZER Comirnaty(Gray Top)Covid-19 Tri-Sucrose Vaccine 10/17/2020   PFIZER(Purple Top)SARS-COV-2 Vaccination 03/02/2019, 03/27/2019, 10/30/2019, 10/25/2021, 10/28/2021   Pneumococcal Conjugate-13 11/20/2013   Pneumococcal Polysaccharide-23 07/20/2006   Td 07/20/2006   Zoster Recombinant(Shingrix) 03/27/2018, 07/14/2018   Zoster, Live 09/09/2010    Health Maintenance  Topic Date Due   INFLUENZA VACCINE  08/26/2022   COVID-19 Vaccine (6 - 2023-24 season) 09/26/2022   Medicare Annual Wellness (AWV)  03/05/2023   Pneumonia Vaccine 22+ Years old  Completed   Zoster  Vaccines- Shingrix  Completed   HPV VACCINES  Aged Out   DTaP/Tdap/Td  Discontinued   Colonoscopy  Discontinued   Chronic Medical Problems:   BPH: Currently on Flomax 0.4 mg daily, still having nocturia x 4-5.  CAD: His next appointment with cardiologist is in 01/2023. Labs during last hospitalization showed mild anemia. Currently on aspirin 81 mg daily, metoprolol succinate 12.5 mg daily, and Plavix 75 mg daily.  Denies any blood in his stool or black stool with the Plavix.  Lab Results  Component Value Date   WBC 11.5 (H) 09/13/2022   HGB 10.3 (L) 09/13/2022   HCT 31.3 (L) 09/13/2022   MCV 86.2 09/13/2022   PLT 456 (H) 09/13/2022   Hyperlipidemia: Currently on atorvastatin 80 mg daily.  Side effects from medication: none Lab Results  Component Value Date   CHOL 83 09/03/2022   HDL 43 09/03/2022   LDLCALC 31 09/03/2022   TRIG 47 09/03/2022   CHOLHDL 1.9 09/03/2022   Prediabetes: Negative for polydipsia,polyuria, or polyphagia.  Lab Results  Component Value Date   HGBA1C 6.0 (H) 09/01/2022   Concerns today:   Patient complains his nose often runs while he is eating. He has had this problem for awhile, but notices it more frequently since his recent hospitalization. Denies epistaxis.   Review of Systems  Constitutional:  Negative for activity change, appetite change and fever.  HENT:  Negative for mouth sores, sore throat and trouble swallowing.   Eyes:  Negative for redness and visual disturbance.  Respiratory:  Negative for cough, shortness of breath and wheezing.   Cardiovascular:  Negative for chest pain, palpitations and leg swelling.  Gastrointestinal:  Negative for abdominal pain, nausea and vomiting.  Endocrine: Negative for cold intolerance and heat intolerance.  Genitourinary:  Negative for decreased urine volume, dysuria, genital sores, hematuria and testicular pain.  Musculoskeletal:  Positive for arthralgias, back pain and gait problem.  Skin:  Negative  for color change and rash.  Allergic/Immunologic: Positive for environmental allergies.  Neurological:  Negative for syncope, weakness and headaches.  Hematological:  Negative for adenopathy. Does not bruise/bleed easily.  Psychiatric/Behavioral:  Negative for confusion and hallucinations.    Current Outpatient Medications on File Prior to Visit  Medication Sig Dispense Refill   ascorbic acid (VITAMIN C) 500 MG tablet Take 0.5 tablets (250 mg total) by mouth daily. 100 tablet 0   aspirin 81 MG tablet Take 1 tablet (81 mg total) by mouth at bedtime. (Patient taking differently: Take 81 mg by mouth daily.) 30 tablet    atorvastatin (LIPITOR) 80 MG tablet Take 1 tablet (80 mg total) by mouth daily. 90 tablet 3   Cholecalciferol (VITAMIN D-3) 125 MCG (5000 UT) TABS Take 125 mcg by mouth daily.     clopidogrel (PLAVIX) 75 MG tablet Take 1 tablet (75 mg total) by mouth daily. 90 tablet 3   Coenzyme Q10 (COQ10 PO) Take 300 mg by mouth daily.     Glucosamine-Chondroitin 500-400 MG CAPS Take 3 tablets by mouth daily.     metoprolol succinate (TOPROL-XL) 25 MG 24 hr tablet Take 0.5 tablets (12.5 mg total) by mouth at bedtime. 15 tablet 0   Multiple Vitamins-Minerals (MENS 50+ MULTIVITAMIN) TABS Take 1 tablet by mouth daily.     Omega-3 Fatty Acids (FISH OIL PO) Take 1,400 mg by mouth daily.     Probiotic Product (PROBIOTIC PO) Take 1 tablet by mouth daily.     tamsulosin (FLOMAX) 0.4 MG CAPS capsule TAKE 1 CAPSULE(0.4 MG) BY MOUTH DAILY 90 capsule 2   traZODone (DESYREL) 50 MG tablet Take 0.5 tablets (25 mg total) by mouth at bedtime. (Patient not taking: Reported on 11/15/2022) 15 tablet 0   No current facility-administered medications on file prior to visit.    Past Medical History:  Diagnosis Date   Allergy    Arthritis    BPH (benign prostatic hyperplasia)    CAD (coronary artery disease)    Cataract    Diverticulosis of colon    Hx of adenomatous colonic polyps 05/22/2014   Hyperlipidemia     Hypertension    MYOCARDIAL INFARCTION, HX OF 07/20/2006   Qualifier: Diagnosis of  By: Cato Mulligan MD, Bruce      Past Surgical History:  Procedure Laterality Date   CATARACT EXTRACTION Bilateral    Bil   COLONOSCOPY     CORONARY ARTERY BYPASS GRAFT N/A 09/01/2022   Procedure: CORONARY ARTERY BYPASS GRAFTING (CABG) times three using left internal mammary artery and right saphenous vein.;  Surgeon: Eugenio Hoes, MD;  Location: [facility] OR;  Service: Open Heart Surgery;  Laterality: N/A;   CORONARY/GRAFT ACUTE MI REVASCULARIZATION N/A 09/01/2022   Procedure: Coronary/Graft Acute MI Revascularization;  Surgeon: Corky Crafts, MD;  Location: [facility] INVASIVE CV LAB;  Service: Cardiovascular;  Laterality: N/A;   IABP INSERTION N/A 09/01/2022   Procedure: IABP Insertion;  Surgeon: Corky Crafts, MD;  Location: [facility] INVASIVE CV LAB;  Service: Cardiovascular;  Laterality: N/A;   LEFT HEART CATH AND CORONARY ANGIOGRAPHY N/A 09/01/2022   Procedure: LEFT HEART CATH AND CORONARY ANGIOGRAPHY;  Surgeon: Corky Crafts, MD;  Location: [facility] INVASIVE CV LAB;  Service: Cardiovascular;  Laterality: N/A;   LUMBAR LAMINECTOMY/DECOMPRESSION MICRODISCECTOMY Left  01/20/2018   Procedure: Left Lumbar five Sacral one extraforaminal decompression;  Surgeon: Tia Alert, MD;  Location: [facility] OR;  Service: Neurosurgery;  Laterality: Left;   PTCA     TEE WITHOUT CARDIOVERSION N/A 09/01/2022   Procedure: TRANSESOPHAGEAL ECHOCARDIOGRAM;  Surgeon: Eugenio Hoes, MD;  Location: [facility] OR;  Service: Open Heart Surgery;  Laterality: N/A;    Allergies  Allergen Reactions   Penicillins Rash    Immediate rash    Family History  Problem Relation Age of Onset   Heart disease Mother        CABG   Dementia Mother 35   Stroke Father    Heart disease Father    Kidney disease Father        renal failure   AAA (abdominal aortic aneurysm) Father    Hypertension Sister    Diabetes Brother    AAA (abdominal aortic aneurysm) Paternal  Uncle    Breast cancer Daughter    Colon cancer Neg Hx     Social History   Socioeconomic History   Marital status: Married    Spouse name: Not on file   Number of children: 3   Years of education: 16   Highest education level: Not on file  Occupational History   Occupation: retired  Tobacco Use   Smoking status: Never   Smokeless tobacco: Never  Vaping Use   Vaping status: Never Used  Substance and Sexual Activity   Alcohol use: Yes    Comment: wine once a week and beer once a week   Drug use: No   Sexual activity: Not on file  Other Topics Concern   Not on file  Social History Narrative   Lives with wife in a 2 story home.  Has 3 children.     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    Education: college.    Occasional beer, no smoking, no drugs   Social Determinants of Health   Financial Resource Strain: Low Risk  (10/16/2021)   Overall Financial Resource Strain (CARDIA)    Difficulty of Paying Living Expenses: Not hard at all  Food Insecurity: No Food Insecurity (09/28/2022)   Hunger Vital Sign    Worried About Running Out of Food in the Last Year: Never true    Ran Out of Food in the Last Year: Never true  Transportation Needs: No Transportation Needs (09/28/2022)   PRAPARE - Administrator, Civil Service (Medical): No    Lack of Transportation (Non-Medical): No  Physical Activity: Sufficiently Active (03/03/2021)   Exercise Vital Sign    Days of Exercise per Week: 6 days    Minutes of Exercise per Session: 90 min  Stress: No Stress Concern Present (03/03/2021)   [employer] of Occupational Health - Occupational Stress Questionnaire    Feeling of Stress : Not at all  Social Connections: Moderately Integrated (01/03/2020)   Social Connection and Isolation Panel [NHANES]    Frequency of Communication with Friends and Family: More than three times a week    Frequency of Social Gatherings with Friends and Family: Three times a week     Attends Religious Services: More than 4 times per year    Active Member of Clubs or Organizations: No    Attends Banker Meetings: Never    Marital Status: Married   Today's Vitals   01/07/23 0807  BP: 124/80  Pulse: 60  Resp: 16  Temp: 97.7 F (36.5 C)  TempSrc:  Oral  SpO2: 98%  Weight: 194 lb (88 kg)  Height: 6\' 2"  (1.88 m)   Body mass index is 24.91 kg/m. Wt Readings from Last 3 Encounters:  01/07/23 194 lb (88 kg)  11/11/22 192 lb 3.9 oz (87.2 kg)  11/05/22 192 lb 9.6 oz (87.4 kg)   Physical Exam Vitals and nursing note reviewed.  Constitutional:      General: He is not in acute distress.    Appearance: He is well-developed.  HENT:     Head: Normocephalic and atraumatic.     Right Ear: External ear normal.     Left Ear: External ear normal.     Ears:     Comments: Hearing aids in place. Eyes:     Conjunctiva/sclera: Conjunctivae normal.     Pupils: Pupils are equal, round, and reactive to light.  Neck:     Thyroid: No thyroid mass or thyromegaly.  Cardiovascular:     Rate and Rhythm: Normal rate and regular rhythm.     Heart sounds: No murmur heard.    Comments: DP pulses palpable.  Trace bilateral pitting edema.  Pulmonary:     Effort: Pulmonary effort is normal. No respiratory distress.     Breath sounds: Normal breath sounds.  Abdominal:     Palpations: Abdomen is soft. There is no hepatomegaly or mass.     Tenderness: There is no abdominal tenderness.  Genitourinary:    Comments: No concerns. Musculoskeletal:        General: No tenderness.     Cervical back: Normal range of motion.     Right lower leg: Pitting Edema present.     Left lower leg: Pitting Edema present.     Comments: No signs of synovitis.  Lymphadenopathy:     Cervical: No cervical adenopathy.  Skin:    General: Skin is warm.     Findings: No erythema or rash.  Neurological:     General: No focal deficit present.     Mental Status: He is alert and oriented to  person, place, and time.     Cranial Nerves: No cranial nerve deficit.     Deep Tendon Reflexes:     Reflex Scores:      Bicep reflexes are 2+ on the right side and 2+ on the left side.      Patellar reflexes are 2+ on the right side and 2+ on the left side.    Comments: Unstable gait, not assisted. Foot drop,left, with brace.  Psychiatric:        Mood and Affect: Mood and affect normal.   ASSESSMENT AND PLAN:  Mr. Pickerel was seen today for his routine general medical examination.  Lab Results  Component Value Date   CHOL 117 01/07/2023   HDL 61.20 01/07/2023   LDLCALC 43 01/07/2023   TRIG 62.0 01/07/2023   CHOLHDL 2 01/07/2023   Lab Results  Component Value Date   HGBA1C 6.4 01/07/2023   Lab Results  Component Value Date   WBC 6.5 01/07/2023   HGB 14.8 01/07/2023   HCT 45.1 01/07/2023   MCV 85.8 01/07/2023   PLT 208.0 01/07/2023   Lab Results  Component Value Date   NA 140 01/07/2023   CL 102 01/07/2023   K 4.2 01/07/2023   CO2 30 01/07/2023   BUN 16 01/07/2023   CREATININE 1.23 01/07/2023   GFR 53.91 (L) 01/07/2023   CALCIUM 8.9 01/07/2023   ALBUMIN 4.2 01/07/2023   GLUCOSE 103 (H) 01/07/2023  Lab Results  Component Value Date   ALT 28 01/07/2023   AST 32 01/07/2023   ALKPHOS 56 01/07/2023   BILITOT 0.8 01/07/2023   Routine general medical examination at a health care facility Assessment & Plan: We discussed the importance of regular physical activity and healthy diet for prevention of chronic illness and/or complications. Preventive guidelines reviewed. Vaccination up to date. Next CPE in a year.   BPH associated with nocturia Assessment & Plan: Symptoms are stable but not well controlled. He has tried other treatments in the past. He has seen urologist. Avoid fluids 3-4 hours before bedtime. Continue Flomax 0.4 mg daily.   Rhinitis, non-allergic Assessment & Plan: ? Vasomotor rhinitis. He agrees with trying Atrovent nasal spray bid. F/U  prn.  Orders: -     Ipratropium Bromide; Place 2 sprays into both nostrils 2 (two) times daily as needed for rhinitis.  Dispense: 15 mL; Refill: 5  Dyslipidemia Assessment & Plan: LDL at goal, it was 31 in 01/1912. He is requesting to have FLP done today. Continue Atorvastatin 80 mg daily and low fat diet. Follows with cardiologist.  Orders: -     Comprehensive metabolic panel; Future -     Lipid panel; Future  Prediabetes Assessment & Plan: Last HgA1C 6.0 in 08/2022. Encouraged consistency with a healthy life style for diabetes prevention.  Orders: -     Comprehensive metabolic panel; Future -     Hemoglobin A1c; Future  Iron deficiency anemia, unspecified iron deficiency anemia type -     CBC; Future  H/H 10.1/30.5 during hospitalization in 08/2022. Further recommendations according to CBC results.   I, Suanne Marker, acting as a scribe for Karmah Potocki [place], MD., have documented all relevant documentation on the behalf of Burech Mcfarland [place], MD, as directed by  Lovetta Condie [place], MD while in the presence of Thorin Starner [place], MD.   I, Suanne Marker, have reviewed all documentation for this visit. The documentation on 01/05/23 for the exam, diagnosis, procedures, and orders are all accurate and complete.  Rhia Blatchford G. [place], MD  [facility]. [place] office.

## 2023-01-07 ENCOUNTER — Encounter (HOSPITAL_COMMUNITY)
Admission: RE | Admit: 2023-01-07 | Discharge: 2023-01-07 | Disposition: A | Discharge status: Home / Self Care | Payer: PPO | Source: Ambulatory Visit | Attending: Cardiovascular Disease

## 2023-01-07 ENCOUNTER — Ambulatory Visit: Payer: PPO | Admitting: Family Medicine

## 2023-01-07 ENCOUNTER — Encounter: Payer: Self-pay | Admitting: Family Medicine

## 2023-01-07 VITALS — BP 124/80 | HR 60 | Temp 97.7°F | Resp 16 | Ht 74.0 in | Wt 194.0 lb

## 2023-01-07 DIAGNOSIS — N401 Enlarged prostate with lower urinary tract symptoms: Secondary | ICD-10-CM

## 2023-01-07 DIAGNOSIS — R7303 Prediabetes: Secondary | ICD-10-CM

## 2023-01-07 DIAGNOSIS — Z951 Presence of aortocoronary bypass graft: Secondary | ICD-10-CM

## 2023-01-07 DIAGNOSIS — D509 Iron deficiency anemia, unspecified: Secondary | ICD-10-CM

## 2023-01-07 DIAGNOSIS — J310 Chronic rhinitis: Secondary | ICD-10-CM | POA: Insufficient documentation

## 2023-01-07 DIAGNOSIS — E785 Hyperlipidemia, unspecified: Secondary | ICD-10-CM

## 2023-01-07 DIAGNOSIS — I213 ST elevation (STEMI) myocardial infarction of unspecified site: Secondary | ICD-10-CM

## 2023-01-07 DIAGNOSIS — Z0000 Encounter for general adult medical examination without abnormal findings: Secondary | ICD-10-CM

## 2023-01-07 DIAGNOSIS — R351 Nocturia: Secondary | ICD-10-CM | POA: Diagnosis not present

## 2023-01-07 LAB — CBC
HCT: 45.1 % (ref 39.0–52.0)
Hemoglobin: 14.8 g/dL (ref 13.0–17.0)
MCHC: 32.8 g/dL (ref 30.0–36.0)
MCV: 85.8 fL (ref 78.0–100.0)
Platelets: 208 10*3/uL (ref 150.0–400.0)
RBC: 5.25 Mil/uL (ref 4.22–5.81)
RDW: 15.3 % (ref 11.5–15.5)
WBC: 6.5 10*3/uL (ref 4.0–10.5)

## 2023-01-07 LAB — COMPREHENSIVE METABOLIC PANEL
ALT: 28 U/L (ref 0–53)
AST: 32 U/L (ref 0–37)
Albumin: 4.2 g/dL (ref 3.5–5.2)
Alkaline Phosphatase: 56 U/L (ref 39–117)
BUN: 16 mg/dL (ref 6–23)
CO2: 30 meq/L (ref 19–32)
Calcium: 8.9 mg/dL (ref 8.4–10.5)
Chloride: 102 meq/L (ref 96–112)
Creatinine, Ser: 1.23 mg/dL (ref 0.40–1.50)
GFR: 53.91 mL/min — ABNORMAL LOW (ref >60.00)
Glucose, Bld: 103 mg/dL — ABNORMAL HIGH (ref 70–99)
Potassium: 4.2 meq/L (ref 3.5–5.1)
Sodium: 140 meq/L (ref 135–145)
Total Bilirubin: 0.8 mg/dL (ref 0.2–1.2)
Total Protein: 6.7 g/dL (ref 6.0–8.3)

## 2023-01-07 LAB — HEMOGLOBIN A1C: Hgb A1c MFr Bld: 6.4 % (ref 4.6–6.5)

## 2023-01-07 LAB — LIPID PANEL
Cholesterol: 117 mg/dL (ref 0–200)
HDL: 61.2 mg/dL (ref >39.00)
LDL Cholesterol: 43 mg/dL (ref 0–99)
NonHDL: 55.62
Total CHOL/HDL Ratio: 2
Triglycerides: 62 mg/dL (ref 0.0–149.0)
VLDL: 12.4 mg/dL (ref 0.0–40.0)

## 2023-01-07 MED ORDER — IPRATROPIUM BROMIDE 0.06 % NA SOLN
2.0000 | Freq: Two times a day (BID) | NASAL | 5 refills | Status: AC | PRN
Start: 2023-01-07 — End: ?

## 2023-01-07 NOTE — Patient Instructions (Addendum)
A few things to remember from today's visit:  Routine general medical examination at a health care facility  BPH associated with nocturia  Rhinitis, non-allergic  Dyslipidemia - Plan: Comprehensive metabolic panel, Lipid panel  Prediabetes - Plan: Comprehensive metabolic panel, Hemoglobin A1c  Iron deficiency anemia, unspecified iron deficiency anemia type - Plan: CBC, Iron, Ferritin Atrovent nasal spray to help with runny nose while eating. No changes today.  If you need refills for medications you take chronically, please call your pharmacy. Do not use My Chart to request refills or for acute issues that need immediate attention. If you send a my chart message, it may take a few days to be addressed, specially if I am not in the office.  Please be sure medication list is accurate. If a new problem present, please set up appointment sooner than planned today.

## 2023-01-08 NOTE — Assessment & Plan Note (Signed)
Symptoms are stable but not well controlled. He has tried other treatments in the past. He has seen urologist. Avoid fluids 3-4 hours before bedtime. Continue Flomax 0.4 mg daily.

## 2023-01-08 NOTE — Assessment & Plan Note (Signed)
We discussed the importance of regular physical activity and healthy diet for prevention of chronic illness and/or complications. Preventive guidelines reviewed. Vaccination up to date. Next CPE in a year. 

## 2023-01-08 NOTE — Assessment & Plan Note (Signed)
Last HgA1C 6.0 in 08/2022. Encouraged consistency with a healthy life style for diabetes prevention.

## 2023-01-08 NOTE — Assessment & Plan Note (Signed)
LDL at goal, it was 31 in 01/6107. He is requesting to have FLP done today. Continue Atorvastatin 80 mg daily and low fat diet. Follows with cardiologist.

## 2023-01-08 NOTE — Assessment & Plan Note (Signed)
?   Vasomotor rhinitis. He agrees with trying Atrovent nasal spray bid. F/U prn.

## 2023-01-10 ENCOUNTER — Encounter (HOSPITAL_COMMUNITY)
Admission: RE | Admit: 2023-01-10 | Discharge: 2023-01-10 | Disposition: A | Discharge status: Home / Self Care | Payer: PPO | Source: Ambulatory Visit | Attending: Cardiovascular Disease

## 2023-01-10 DIAGNOSIS — I213 ST elevation (STEMI) myocardial infarction of unspecified site: Secondary | ICD-10-CM

## 2023-01-10 DIAGNOSIS — Z951 Presence of aortocoronary bypass graft: Secondary | ICD-10-CM

## 2023-01-11 ENCOUNTER — Encounter: Payer: PPO | Admitting: Family Medicine

## 2023-01-12 ENCOUNTER — Encounter (HOSPITAL_COMMUNITY)
Admission: RE | Admit: 2023-01-12 | Discharge: 2023-01-12 | Disposition: A | Discharge status: Home / Self Care | Payer: PPO | Source: Ambulatory Visit | Attending: Cardiovascular Disease | Admitting: Cardiovascular Disease

## 2023-01-12 VITALS — Ht 74.0 in | Wt 195.5 lb

## 2023-01-12 DIAGNOSIS — I213 ST elevation (STEMI) myocardial infarction of unspecified site: Secondary | ICD-10-CM

## 2023-01-12 DIAGNOSIS — Z951 Presence of aortocoronary bypass graft: Secondary | ICD-10-CM

## 2023-01-14 ENCOUNTER — Encounter (HOSPITAL_COMMUNITY)
Admission: RE | Admit: 2023-01-14 | Discharge: 2023-01-14 | Disposition: A | Discharge status: Home / Self Care | Payer: PPO | Source: Ambulatory Visit | Attending: Cardiovascular Disease | Admitting: Cardiovascular Disease

## 2023-01-14 DIAGNOSIS — I213 ST elevation (STEMI) myocardial infarction of unspecified site: Secondary | ICD-10-CM

## 2023-01-14 DIAGNOSIS — Z951 Presence of aortocoronary bypass graft: Secondary | ICD-10-CM | POA: Diagnosis not present

## 2023-01-17 ENCOUNTER — Telehealth: Payer: Self-pay

## 2023-01-17 ENCOUNTER — Encounter (HOSPITAL_COMMUNITY)
Admission: RE | Admit: 2023-01-17 | Discharge: 2023-01-17 | Disposition: A | Discharge status: Home / Self Care | Payer: PPO | Source: Ambulatory Visit | Attending: Cardiovascular Disease

## 2023-01-17 DIAGNOSIS — Z951 Presence of aortocoronary bypass graft: Secondary | ICD-10-CM

## 2023-01-17 DIAGNOSIS — I213 ST elevation (STEMI) myocardial infarction of unspecified site: Secondary | ICD-10-CM

## 2023-01-17 NOTE — Telephone Encounter (Signed)
Copied from CRM [id number]. Topic: Clinical - Lab/Test Results >> Jan 17, 2023 10:25 AM Orinda Kenner C wrote: Reason for CRM:Pt wants to discuss his labs results pertaining to his kidneys, pls c/b [phone]

## 2023-01-17 NOTE — Progress Notes (Signed)
Reviewed home exercise Rx with patient today.  Encouraged warm-up, cool-down, and stretching. Reviewed THRR of  54 - 109 and keeping RPE between 11-13. Encouraged to hydrate with activity.  Reviewed weather parameters for temperature and humidity for safe exercise outdoors. Reviewed S/S to terminate exercise and when to call 911 vs MD. Reviewed the use of NTG and pt was encouraged to carry at all times. Pt encouraged to always carry a cell phone for safety when exercising outdoors. Pt verbalized understanding of the home exercise Rx and was provided a copy.   Lorin Picket MS, ACSM-CEP, CCRP

## 2023-01-17 NOTE — Telephone Encounter (Signed)
I called and spoke with patient. All questions were answered, nothing further needed at this time.

## 2023-01-21 ENCOUNTER — Encounter (HOSPITAL_COMMUNITY)
Admission: RE | Admit: 2023-01-21 | Discharge: 2023-01-21 | Disposition: A | Discharge status: Home / Self Care | Payer: PPO | Source: Ambulatory Visit | Attending: Cardiovascular Disease | Admitting: Cardiovascular Disease

## 2023-01-21 DIAGNOSIS — I213 ST elevation (STEMI) myocardial infarction of unspecified site: Secondary | ICD-10-CM

## 2023-01-21 DIAGNOSIS — Z951 Presence of aortocoronary bypass graft: Secondary | ICD-10-CM

## 2023-01-24 ENCOUNTER — Encounter (HOSPITAL_COMMUNITY)
Admission: RE | Admit: 2023-01-24 | Discharge: 2023-01-24 | Disposition: A | Discharge status: Home / Self Care | Payer: PPO | Source: Ambulatory Visit | Attending: Cardiovascular Disease

## 2023-01-24 DIAGNOSIS — Z951 Presence of aortocoronary bypass graft: Secondary | ICD-10-CM

## 2023-01-24 DIAGNOSIS — I213 ST elevation (STEMI) myocardial infarction of unspecified site: Secondary | ICD-10-CM

## 2023-01-24 NOTE — Progress Notes (Incomplete)
Discharge Progress Report  Patient Details  Name: Collin [name] MRN: [id number] Date of Birth: Dec 17, 1940 Referring Provider:   Flowsheet Row INTENSIVE CARDIAC REHAB ORIENT from 11/11/2022 in [facility]  Referring Provider Collin Bollman, MD        Number of Visits: ***  Reason for Discharge:  Patient reached a stable level of exercise. Patient independent in their exercise. Patient has met program and personal goals.  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  ADL [facility]:   Initial Exercise Prescription:  Initial Exercise Prescription - 11/11/22 1100       Date of Initial Exercise RX and Referring Provider   Date 11/11/22    Referring Provider Collin Bollman, MD    Expected Discharge Date 02/02/23      NuStep   Level 1    SPM 70    Minutes 25    METs 2      Prescription Details   Frequency (times per week) 3    Duration Progress to 30 minutes of continuous aerobic without signs/symptoms of physical distress      Intensity   THRR 40-80% of Max Heartrate 54-109    Ratings of Perceived Exertion 11-13    Perceived Dyspnea 0-4      Progression   Progression Continue progressive overload as per policy without signs/symptoms or physical distress.      Resistance Training   Training Prescription Yes    Weight 3 lbs    Reps 10-15             Discharge Exercise Prescription (Final Exercise Prescription Changes):  Exercise Prescription Changes - 01/17/23 1400       Response to Exercise   Blood Pressure (Admit) 112/70    Blood Pressure (Exit) 110/60    Heart Rate (Admit) 60 bpm    Heart Rate (Exercise) 113 bpm    Heart Rate (Exit) 66 bpm    Rating of Perceived Exertion (Exercise) 13    Symptoms None    Comments Reviewed home exercise Rx    Duration Continue with 30 min of aerobic  exercise without signs/symptoms of physical distress.    Intensity THRR unchanged      Progression   Progression Continue to progress workloads to maintain intensity without signs/symptoms of physical distress.    Average METs 3.9      Resistance Training   Training Prescription Yes    Weight 4 lbs    Reps 10-15    Time 10 Minutes      Interval Training   Interval Training No      NuStep   Level 5    SPM 105    Minutes 30    METs 3.9      Home Exercise Plan   Plans to continue exercise at [facility] (comment)    Frequency Add 2 additional days to program exercise sessions.    Initial Home Exercises Provided 01/17/23             Functional Capacity:  6 Minute Walk     Row Name 11/11/22 1109 01/12/23 1255       6 Minute Walk   Phase Initial  Pt used go-cart Discharge  Pt used Go-cart    Distance 999 feet  Pt used Go-cart 1572 feet    Distance %  Change -- 57.36 %    Distance Feet Change -- 573 ft    Walk Time 6 minutes 6 minutes    # of Rest Breaks 0 0    MPH 1.9 3    METS 2 3    RPE 9 11    Perceived Dyspnea  0 0    VO2 Peak 6.9 10.4    Symptoms No No    Resting HR 59 bpm 56 bpm    Resting BP 124/60 120/62    Resting Oxygen Saturation  96 % --    Exercise Oxygen Saturation  during 6 min walk 97 % --    Max Ex. HR 102 bpm 100 bpm    Max Ex. BP 134/70 144/80    2 Minute Post BP 114/66 --             Psychological, QOL, Others - Outcomes: PHQ 2/9:    01/21/2023    1:09 PM 11/11/2022   10:13 AM 03/04/2022    3:54 PM 01/05/2022    3:02 PM 07/15/2021   11:03 AM  Depression screen PHQ 2/9  Decreased Interest 1 0 0 0 0  Down, Depressed, Hopeless 0 0 0 0 0  PHQ - 2 Score 1 0 0 0 0  Altered sleeping 0 1 0    Tired, decreased energy 0 1 0    Change in appetite 0 0 0    Feeling bad or failure about yourself  0 0 0    Trouble concentrating 0 0 1    Moving slowly or fidgety/restless 0 1 0    Suicidal thoughts 0 0 0    PHQ-9 Score 1 3 1      Difficult doing work/chores Not difficult at all Not difficult at all Not difficult at all      Quality of Life:  Quality of Life - 11/11/22 1232       Quality of Life   Select Quality of Life      Quality of Life Scores   Health/Function Pre 19.37 %    Socioeconomic Pre 22.57 %    Psych/Spiritual Pre 22.07 %    Family Pre 26.4 %    GLOBAL Pre 21.62 %             Personal Goals: Goals established at orientation with interventions provided to work toward goal.  Personal Goals and Risk Factors at Admission - 11/11/22 1147       Core Components/Risk Factors/Patient Goals on Admission   Hypertension Yes    Intervention Provide education on lifestyle modifcations including regular physical activity/exercise, weight management, moderate sodium restriction and increased consumption of fresh fruit, vegetables, and low fat dairy, alcohol moderation, and smoking cessation.;Monitor prescription use compliance.    Expected Outcomes Short Term: Continued assessment and intervention until BP is < 140/70mm HG in hypertensive participants. < 130/63mm HG in hypertensive participants with diabetes, heart failure or chronic kidney disease.;Long Term: Maintenance of blood pressure at goal levels.    Lipids Yes    Intervention Provide education and support for participant on nutrition & aerobic/resistive exercise along with prescribed medications to achieve LDL 70mg , HDL >40mg .    Expected Outcomes Short Term: Participant states understanding of desired cholesterol values and is compliant with medications prescribed. Participant is following exercise prescription and nutrition guidelines.;Long Term: Cholesterol controlled with medications as prescribed, with individualized exercise RX and with personalized nutrition plan. Value goals: LDL < 70mg , HDL > 40 mg.  Personal Goals Discharge:  Goals and Risk Factor Review     Row Name 11/15/22 1701 12/14/22 1326 01/05/23 1729          Core Components/Risk Factors/Patient Goals Review   Personal Goals Review Weight Management/Obesity;Hypertension;Lipids Weight Management/Obesity;Hypertension;Lipids Weight Management/Obesity;Hypertension;Lipids     Review Collin [name] started cardiac rehab on 11/15/22. Collin [name] did well with exercise for his fitness level. Collin [name] is noted to be somewhat unsteady. Uses a cane for stability. Vital signs were stable. Collin [name] is doing well with exercise at cardiac rehab.  Vital signs have been stable. Collin [name] has increased his met level since starting the program. Collin [name]  continues to do well with exercise at cardiac rehab.  Vital signs have been stable. Collin [name] has increased his met level since starting the program.     Expected Outcomes Collin [name] will continue to participate in cardiac rehab for exercise, nutrition and lifestyle modifications Collin [name] will continue to participate in cardiac rehab for exercise, nutrition and lifestyle modifications Collin [name] will continue to participate in cardiac rehab for exercise, nutrition and lifestyle modifications              Exercise Goals and Review:  Exercise Goals     Row Name 11/11/22 0923             Exercise Goals   Increase Physical Activity Yes       Intervention Provide advice, education, support and counseling about physical activity/exercise needs.;Develop an individualized exercise prescription for aerobic and resistive training based on initial evaluation findings, risk stratification, comorbidities and participant's personal goals.       Expected Outcomes Short Term: Attend rehab on a regular basis to increase amount of physical activity.;Long Term: Exercising regularly at least 3-5 days a week.;Long Term: Add in home exercise to make exercise part of routine and to increase amount of physical activity.       Increase Strength and Stamina Yes       Intervention Provide advice, education, support and counseling about physical activity/exercise needs.;Develop an  individualized exercise prescription for aerobic and resistive training based on initial evaluation findings, risk stratification, comorbidities and participant's personal goals.       Expected Outcomes Short Term: Increase workloads from initial exercise prescription for resistance, speed, and METs.;Short Term: Perform resistance training exercises routinely during rehab and add in resistance training at home;Long Term: Improve cardiorespiratory fitness, muscular endurance and strength as measured by increased METs and functional capacity ( )       Able to understand and use rate of perceived exertion (RPE) scale Yes       Intervention Provide education and explanation on how to use RPE scale       Expected Outcomes Long Term:  Able to use RPE to guide intensity level when exercising independently;Short Term: Able to use RPE daily in rehab to express subjective intensity level       Knowledge and understanding of Target Heart Rate Range (THRR) Yes       Intervention Provide education and explanation of THRR including how the numbers were predicted and where they are located for reference       Expected Outcomes Short Term: Able to state/look up THRR;Short Term: Able to use daily as guideline for intensity in rehab;Long Term: Able to use THRR to govern intensity when exercising independently       Understanding of Exercise Prescription Yes       Intervention Provide education, explanation, and written materials on patient's individual exercise prescription  Expected Outcomes Short Term: Able to explain program exercise prescription;Long Term: Able to explain home exercise prescription to exercise independently                Exercise Goals Re-Evaluation:  Exercise Goals Re-Evaluation     Row Name 11/15/22 1418 12/22/22 1500           Exercise Goal Re-Evaluation   Exercise Goals Review Increase Physical Activity;Increase Strength and Stamina;Able to understand and use Dyspnea  scale;Knowledge and understanding of Target Heart Rate Range (THRR);Understanding of Exercise Prescription Increase Physical Activity;Increase Strength and Stamina;Able to understand and use Dyspnea scale;Knowledge and understanding of Target Heart Rate Range (THRR);Understanding of Exercise Prescription      Comments Pt's first day in the [facility] program. Pt understands the exercise Rx, RPE scale, and THRR. Reviewed METs and goals. Pt report progress on his goals. Pt has less fatigue and more strength and stamina in his legs, less so in the arms. Pt voices he is walking 2-3x/week for 30 minutes, covering about 1.5 miles.      Expected Outcomes Will monitor patient and progress exercise workloads as tolerated. Will monitor patient and progress exercise workloads as tolerated.               Nutrition & Weight - Outcomes:   Post Biometrics - 01/12/23 1305        Post  Biometrics   Height 6\' 2"  (1.88 m)    Weight 88.7 kg    Waist Circumference 40 inches    Hip Circumference 40 inches    Waist to Hip Ratio 1 %    BMI (Calculated) 25.1    Triceps Skinfold 10 mm    % Body Fat 25 %    Grip Strength 44 kg    Flexibility --   Not performed, low back problems   Single Leg Stand --   pt voices cannot do it            Nutrition:  Nutrition Therapy & Goals - 12/13/22 1549       Nutrition Therapy   Diet Heart healthy diet    Drug/Food Interactions Statins/Certain Fruits      Personal Nutrition Goals   Nutrition Goal Patient to identify strategies for reducing cardiovascular risk by attending the [employer] education and nutrition series weekly.   goal in progress.   Personal Goal #2 Patient to improve diet quality by using the plate method as a guide for meal planning to include lean protein/plant protein, fruits, vegetables, whole grains, nonfat dairy as part of a well-balanced diet.   goal in progress.   Personal Goal #3 Patient to limit sodium to 1500mg  per day   goal in progress.    Comments Goals in progress. Juandavid has attended 5 [employer] education classes. Patient has medical history of STEMI, CAD, CABGx3, preDM. His LDL remains at goal. His A1c is in a prediabetic range. He has maintained his weight since starting with our program.  His wife is a good support. Patient will benefit from participation in intensive cardiac rehab for nutrition, exercise, and lifestyle modification.      Intervention Plan   Intervention Prescribe, educate and counsel regarding individualized specific dietary modifications aiming towards targeted core components such as weight, hypertension, lipid management, diabetes, heart failure and other comorbidities.;Nutrition handout(s) given to patient.    Expected Outcomes Short Term Goal: Understand basic principles of dietary content, such as calories, fat, sodium, cholesterol and nutrients.;Long Term Goal: Adherence to prescribed nutrition  plan.             Nutrition Discharge:  Nutrition Assessments - 11/16/22 1547       Rate Your Plate Scores   Pre Score 63             Education Questionnaire Score:  Knowledge Questionnaire Score - 11/11/22 1233       Knowledge Questionnaire Score   Pre Score 20/24             Goals reviewed with patient; copy given to patient.Pt graduates from  Intensive/Traditional cardiac rehab program on 01/24/23 with completion of    ** exercise and education sessions. Pt maintained good attendance and progressed nicely during their participation in rehab as evidenced by increased MET level. Collin [name] increased his distance on his post exercise walk test by 573 feet.   Medication list reconciled. Repeat  PHQ score-  .  Pt has made significant lifestyle changes and should be commended for their success.  **** achieved their goals during cardiac rehab.   Pt plans to continue exercise at the gym 3 days a week and walking 1 day a week.Thayer Headings RN BSN

## 2023-01-28 ENCOUNTER — Encounter (HOSPITAL_COMMUNITY): Payer: Self-pay

## 2023-01-31 ENCOUNTER — Encounter (HOSPITAL_COMMUNITY): Payer: Self-pay

## 2023-02-02 ENCOUNTER — Encounter (HOSPITAL_COMMUNITY): Payer: Self-pay

## 2023-02-07 ENCOUNTER — Encounter: Payer: Self-pay | Admitting: Cardiovascular Disease

## 2023-02-07 ENCOUNTER — Ambulatory Visit: Payer: Self-pay | Attending: Cardiovascular Disease | Admitting: Cardiovascular Disease

## 2023-02-07 VITALS — BP 120/70 | HR 73 | Ht 73.0 in | Wt 194.4 lb

## 2023-02-07 DIAGNOSIS — E782 Mixed hyperlipidemia: Secondary | ICD-10-CM

## 2023-02-07 DIAGNOSIS — I10 Essential (primary) hypertension: Secondary | ICD-10-CM

## 2023-02-07 DIAGNOSIS — I2510 Atherosclerotic heart disease of native coronary artery without angina pectoris: Secondary | ICD-10-CM | POA: Diagnosis not present

## 2023-02-07 NOTE — Patient Instructions (Signed)
Follow-Up: At [facility], you and your health needs are our priority.  As part of our continuing mission to provide you with exceptional heart care, we have created designated Provider Care Teams.  These Care Teams include your primary Cardiologist (physician) and Advanced Practice Providers (APPs -  Physician Assistants and Nurse Practitioners) who all work together to provide you with the care you need, when you need it.  Your next appointment:   6 month(s)  Provider:   Tonny Bollman, MD

## 2023-02-07 NOTE — Progress Notes (Signed)
 Cardiology Office Note:    Date:  02/07/2023   ID:  Collin [name], DOB [birth year], MRN [id number]  PCP:  Jordan, Betty G, MD   [facility] Providers Cardiologist:  Ozell Fell, MD     Referring MD: Jordan, Betty G, MD   Chief Complaint  Patient presents with   Coronary Artery Disease    History of Present Illness:    Collin [name] is a 83 y.o. male with a hx of:  Coronary artery disease  S/p remote inferior MI in 1998 tx with POBA to RCA  Mid LAD disease tx medically (beta-blocker) Cath 9/13: mLAD 80; oD1 80, pD2 80; mLCx 50, OM1 50-60; mRCA 30 NSTEMI 08.2024 - severe left main disease - emergent CABG LIMA-LAD, SVG-diagonal, SVG-OM Lumbar disc dz S/p L5-S1 microdiscectomy 12/19 Hypertension  Hyperlipidemia  BPH  The patient is here alone today.  He has been doing well, graduated from outpatient cardiac rehab last week.  He still has fatigue with exercise ever since his surgery.  He is up to walking about 13 minutes before he has to stop and rest.  Leg fatigue is his primary limitation.  He denies chest pain or shortness of breath.  He is compliant with his medications.  No lightheadedness or presyncope.  He just had labs checked recently with the creatinine of 1.23, potassium 4.2, total cholesterol 117, HDL 61, LDL 43, and triglyceride 62.   Current Medications: Current Meds  Medication Sig   ascorbic acid  (VITAMIN C ) 500 MG tablet Take 0.5 tablets (250 mg total) by mouth daily.   aspirin  81 MG tablet Take 1 tablet (81 mg total) by mouth at bedtime. (Patient taking differently: Take 81 mg by mouth daily.)   atorvastatin  (LIPITOR ) 80 MG tablet Take 1 tablet (80 mg total) by mouth daily.   Cholecalciferol (VITAMIN D-3) 125 MCG (5000 UT) TABS Take 125 mcg by mouth daily.   clopidogrel  (PLAVIX ) 75 MG tablet Take 1 tablet (75 mg total) by mouth daily.   Coenzyme Q10 (COQ10 PO) Take 300 mg by mouth daily.   Glucosamine-Chondroitin 500-400 MG CAPS Take 3  tablets by mouth daily.   ipratropium (ATROVENT ) 0.06 % nasal spray Place 2 sprays into both nostrils 2 (two) times daily as needed for rhinitis.   metoprolol  succinate (TOPROL -XL) 25 MG 24 hr tablet Take 0.5 tablets (12.5 mg total) by mouth at bedtime.   Multiple Vitamins-Minerals (MENS 50+ MULTIVITAMIN) TABS Take 1 tablet by mouth daily.   Omega-3 Fatty Acids (FISH OIL PO) Take 1,400 mg by mouth daily.   Probiotic Product (PROBIOTIC PO) Take 1 tablet by mouth daily.   tamsulosin  (FLOMAX ) 0.4 MG CAPS capsule TAKE 1 CAPSULE(0.4 MG) BY MOUTH DAILY     Allergies:   Penicillins   ROS:   Please see the history of present illness.    All other systems reviewed and are negative.  EKGs/Labs/Other Studies Reviewed:    The following studies were reviewed today: Cardiac Studies & Procedures   CARDIAC CATHETERIZATION  CARDIAC CATHETERIZATION 09/01/2022  Narrative   Mid LM to Dist LM lesion is 90% stenosed.   Ost Cx to Prox Cx lesion is 99% stenosed.   1st Diag lesion is 50% stenosed.   2nd Diag lesion is 90% stenosed.   Mid LAD lesion is 80% stenosed.   The left ventricular systolic function is normal.   LV end diastolic pressure is mildly elevated.   The left ventricular ejection fraction is 50-55% by visual estimate.  There is no aortic valve stenosis.   Recommend Aspirin  post CABG.  Add Plavix  when safe from a bleeding standpoint.  Acute inferior MI due to severe ostial circumflex and distal left main stenosis.  Severe mid LAD disease and ostial second diagonal disease.  Patent RCA.  Given the anatomy, will plan for emergent bypass surgery.  IABP was placed in the right femoral artery.  The patient was also started on IV nitroglycerin .  Dr. Maryjane has evaluated the patient and will be taking the patient to the OR shortly.  Findings Coronary Findings Diagnostic  Dominance: Right  Left Main Mid LM to Dist LM lesion is 90% stenosed.  Left Anterior Descending Mid LAD lesion is 80%  stenosed.  First Diagonal Branch 1st Diag lesion is 50% stenosed.  Second Diagonal Branch 2nd Diag lesion is 90% stenosed.  Left Circumflex Ost Cx to Prox Cx lesion is 99% stenosed.  Right Coronary Artery Vessel is large.  Intervention  No interventions have been documented.   STRESS TESTS  MYOCARDIAL PERFUSION IMAGING 11/29/1996   TEE  ECHO INTRAOPERATIVE TEE 09/01/2022  Narrative *INTRAOPERATIVE TRANSESOPHAGEAL REPORT *    Patient Name:   Collin [name] Date of Exam: 09/01/2022 Medical Rec #:  [id number]        Height:       73.0 in Accession #:    [id number]       Weight:       196.2 lb Date of Birth:  [birth year]        BSA:          2.13 m Patient Age:    82 years         BP:           119/43 mmHg Patient Gender: M                HR:           66 bpm. Exam Location:  Anesthesiology  Transesophogeal exam was perform intraoperatively during surgical procedure. Patient was closely monitored under general anesthesia during the entirety of examination.  Indications:     I20.0 Unstable angina Performing Phys: [id number] DEWARD MARYJANE  Complications: No known complications during this procedure. POST-OP IMPRESSIONS _ Left Ventricle: has normal systolic function, with an ejection fraction of 65%. The cavity size was normal. The wall motion is normal. _ Right Ventricle: The right ventricle appears unchanged from pre-bypass normal function. _ Aorta: The aorta appears unchanged from pre-bypassIntra-aortic balloon pump present in descending aorta. _ Left Atrial Appendage: The left atrial appendage appears unchanged from pre-bypass. _ Mitral Valve: There is mild regurgitation. _ Tricuspid Valve: There is trivial regurgitation. _ Pulmonic Valve: The pulmonic valve appears unchanged from pre-bypass. _ Interatrial Septum: The interatrial septum appears unchanged from pre-bypass. _ Pericardium: The pericardium appears unchanged from pre-bypass. _ Comments: Post-bypass images  reviewed with surgeon.  PRE-OP FINDINGS Left Ventricle: The left ventricle has normal systolic function, with an ejection fraction of 60-65%. The cavity size was normal. There is no left ventricular hypertrophy.   Right Ventricle: The right ventricle has normal systolic function. The cavity was normal. There is no increase in right ventricular wall thickness.  Left Atrium: Left atrial size was normal in size. No left atrial/left atrial appendage thrombus was detected.  Right Atrium: Right atrial size was normal in size.  Interatrial Septum: No atrial level shunt detected by color flow Doppler.  Pericardium: There is no evidence of pericardial effusion.  Mitral Valve: The mitral  valve is normal in structure. Mitral valve regurgitation is trivial by color flow Doppler.  Tricuspid Valve: The tricuspid valve was normal in structure. Tricuspid valve regurgitation was not visualized by color flow Doppler.  Aortic Valve: The aortic valve is tricuspid Aortic valve regurgitation was not visualized by color flow Doppler.   Pulmonic Valve: The pulmonic valve was normal in structure. Pulmonic valve regurgitation is trivial by color flow Doppler.   Aorta: The aortic root, ascending aorta and aortic arch are normal in size and structure. Intra-aortic balloon pump present in descending aorta.  Pulmonary Artery: The pulmonary artery is of normal size.  +--------------+-------++ LEFT VENTRICLE        +--------------+-------++ PLAX 2D               +--------------+-------++ LVIDd:        4.20 cm +--------------+-------++ LVIDs:        2.70 cm +--------------+-------++ LV PW:        0.70 cm +--------------+-------++ LV SV:        52 ml   +--------------+-------++ LV SV Index:  23.98   +--------------+-------++                       +--------------+-------++   Garnette Skillern MD Electronically signed by Garnette Skillern MD Signature Date/Time:  09/03/2022/12:33:38 PM    Final            EKG:        Recent Labs: 09/02/2022: Magnesium  2.2 01/07/2023: ALT 28; BUN 16; Creatinine, Ser 1.23; Hemoglobin 14.8; Platelets 208.0; Potassium 4.2; Sodium 140  Recent Lipid Panel    Component Value Date/Time   CHOL 117 01/07/2023 0917   TRIG 62.0 01/07/2023 0917   HDL 61.20 01/07/2023 0917   CHOLHDL 2 01/07/2023 0917   VLDL 12.4 01/07/2023 0917   LDLCALC 43 01/07/2023 0917   LDLCALC 54 11/05/2019 0828     Risk Assessment/Calculations:                Physical Exam:    VS:  BP 120/70   Pulse 73   Ht 6' 1 (1.854 m)   Wt 194 lb 6.4 oz (88.2 kg)   SpO2 94%   BMI 25.65 kg/m     Wt Readings from Last 3 Encounters:  02/07/23 194 lb 6.4 oz (88.2 kg)  01/12/23 195 lb 8.8 oz (88.7 kg)  01/07/23 194 lb (88 kg)     GEN:  Well nourished, well developed in no acute distress HEENT: Normal NECK: No JVD; No carotid bruits LYMPHATICS: No lymphadenopathy CARDIAC: RRR, no murmurs, rubs, gallops RESPIRATORY:  Clear to auscultation without rales, wheezing or rhonchi  ABDOMEN: Soft, non-tender, non-distended MUSCULOSKELETAL:  No edema; No deformity  SKIN: Warm and dry NEUROLOGIC:  Alert and oriented x 3 PSYCHIATRIC:  Normal affect   Assessment & Plan Coronary artery disease involving native coronary artery of native heart without angina pectoris The patient is now recovering well out 5 months from emergent CABG.  He has been off of amiodarone  for several months without any recurrent symptoms of atrial fibrillation.  His blood pressure is well-controlled.  He has completed cardiac rehab.  He will continue on aspirin  and clopidogrel  through 12 months from surgery.  He will continue on a high intensity statin drug.  I will see him back in 6 months for follow-up evaluation. Mixed hyperlipidemia Lipids are at goal with LDL cholesterol 43 mg/dL on atorvastatin  80 mg daily.  I will see him  back in 6 months.  He will continue on current  medical therapy. Essential hypertension Blood pressure well-controlled on low-dose metoprolol  succinate.      Medication Adjustments/Labs and Tests Ordered: Current medicines are reviewed at length with the patient today.  Concerns regarding medicines are outlined above.  No orders of the defined types were placed in this encounter.  No orders of the defined types were placed in this encounter.   Patient Instructions  Follow-Up: At [facility], you and your health needs are our priority.  As part of our continuing mission to provide you with exceptional heart care, we have created designated Provider Care Teams.  These Care Teams include your primary Cardiologist (physician) and Advanced Practice Providers (APPs -  Physician Assistants and Nurse Practitioners) who all work together to provide you with the care you need, when you need it.  Your next appointment:   6 month(s)  Provider:   Ozell Fell, MD             Signed, Ozell Fell, MD  02/07/2023 3:38 PM    [facility]

## 2023-02-11 NOTE — Telephone Encounter (Signed)
CAD ,s/p angioplasty and recently CABG, so he needs to be on both; unless he is having any problem with medications. He can ask his cardiologist next f/u visit. Thanks, BJ

## 2023-02-11 NOTE — Telephone Encounter (Signed)
Copied from CRM [id number]. Topic: Clinical - Medication Question >> Feb 10, 2023  4:49 PM Alvino Blood C wrote: Reason for CRM:  Patient is taking aspirin and Plavix and would like to know could he discontinue taking the aspirin.

## 2023-02-14 NOTE — Telephone Encounter (Signed)
I called and spoke with patient. He is aware of message below and will call the cardiologist office to see what the cardiologist says about continuing both medications.

## 2023-03-16 ENCOUNTER — Telehealth: Payer: Self-pay | Admitting: Cardiovascular Disease

## 2023-03-16 ENCOUNTER — Other Ambulatory Visit: Payer: Self-pay

## 2023-03-16 ENCOUNTER — Other Ambulatory Visit: Payer: Self-pay | Admitting: Family Medicine

## 2023-03-16 DIAGNOSIS — N401 Enlarged prostate with lower urinary tract symptoms: Secondary | ICD-10-CM

## 2023-03-16 NOTE — Telephone Encounter (Signed)
 Called and spoke with patient. He states that he is having a really hard time splitting this pill and a good bit of it is crumbling when he uses the pill splitter.  I checked and did not find an option to order this med as an 12.5 mg dose. Informed patient that not able to change dose and did not recommend that he takes the pill every other day as he was requesting.  Informed him that I would send this message to Dr Excell Seltzer to advise how to proceed.

## 2023-03-16 NOTE — Telephone Encounter (Signed)
 Pt c/o medication issue:  1. Name of Medication: metoprolol succinate (TOPROL-XL) 25 MG 24 hr tablet   2. How are you currently taking this medication (dosage and times per day)? 12.5 mg, Daily at bedtime   3. Are you having a reaction (difficulty breathing--STAT)? no  4. What is your medication issue? Pt is having an issue with cutting pill in half and wants to know if ok to take every other day

## 2023-03-16 NOTE — Telephone Encounter (Signed)
 I would have him try taking a a dose of metoprolol succinate 25 mg daily. This is still a low dose. thanks

## 2023-03-18 NOTE — Telephone Encounter (Signed)
 Returned call to patient, no answer. Left detailed message per DPR advising him that Dr Excell Seltzer has suggested he take the whole Metoprolol tablet since cutting is posing difficulties. Additionally, I advised that he reach out to his pharmacy to see if they are willing to cut the pills for him or have another manufacturer available to fill his prescription with that may be easier to cut. Advised he call back with further concerns

## 2023-04-04 NOTE — Progress Notes (Unsigned)
 [name] [address] [place] [address] Phone: [phone]   Assessment and Plan:    1. Chronic bilateral low back pain with bilateral sciatica 2. Degeneration of intervertebral disc of lumbar region with discogenic back pain and lower extremity pain 3. Spinal stenosis, lumbar region, with neurogenic claudication 4. Physical deconditioning  -Chronic with exacerbation, subsequent visit - Overall physical deconditioning and core, lower extremities after cardiac event in 2024 - Patient completed cardiac rehab, though I feel overall strength and balance would improve with additional physical and Occupational Therapy.  Patient declines these therapies at this time because he did not in the past and did not feel significant benefit.  Could consider PT and OT referrals in the future - Start Tylenol 500 to 1000 mg tablets 2-3 times a day for day-to-day pain relief - Patient continues to have left-sided radicular symptoms with prior MRI showing worsening stenosis, bilateral neuroforaminal stenosis at L3-L4 due to disc bulge and severe facet arthrosis, unchanged severe right and moderate left L4-L5 neuroforaminal stenosis, and continued severe left L5-S1 neural foraminal stenosis.  Patient had epidural CSI in February 2024 and states he had 2 to 3 months relief of symptoms.  He is not interested in repeat injection at this time. - Start HEP for core  15 additional minutes spent for educating Therapeutic Home Exercise Program.  This included exercises focusing on stretching, strengthening, with focus on eccentric aspects.   Long term goals include an improvement in range of motion, strength, endurance as well as avoiding reinjury. Patient's frequency would include in 1-2 times a day, 3-5 times a week for a duration of 6-12 weeks. Proper technique shown and discussed handout in great detail with ATC.  All questions were discussed and answered.     Pertinent previous records reviewed include cardiac notes December 2024  Follow Up: 4 weeks for reevaluation.  Could further discuss PT versus OT versus epidural CSI   Subjective:   I, Collin [name], am serving as a Neurosurgeon for Doctor Richardean Sale   HPI: low back pain and right shoulder pain    01/07/22 Patient is  83 year old male complaining of low back and right shoulder pain. Patient states that he hurt his shoulders lifting weights 6 months ago , not to worried can still swing a golf club has pain when she tucks his shirt in   Back pain for years hx of back surgery 09, L5 bulging dics and that's what the pain feels like , pain radiates down to the hip ,  pain on the right side , no numbness or tingling , no meds for the pain if he stretches that helps with the pain , standing is hard to do , walking eases the pain , and no pain when sitting , balance is also affected    02/08/2022 Patient states his back is the same and the shoulder is a little better    03/01/2022 Patient states MRI results, he feels okay  04/05/2023 Patient states his balance was really bad . He has had some improvement with his back since he made the appointment    Relevant Historical Information: History of L5-S1 left-sided decompression  Additional pertinent review of systems negative.   Current Outpatient Medications:    ascorbic acid (VITAMIN C) 500 MG tablet, Take 0.5 tablets (250 mg total) by mouth daily., Disp: 100 tablet, Rfl: 0   aspirin 81 MG tablet, Take 1 tablet (81 mg total)  by mouth at bedtime. (Patient taking differently: Take 81 mg by mouth daily.), Disp: 30 tablet, Rfl:    atorvastatin (LIPITOR) 80 MG tablet, Take 1 tablet (80 mg total) by mouth daily., Disp: 90 tablet, Rfl: 3   Cholecalciferol (VITAMIN D-3) 125 MCG (5000 UT) TABS, Take 125 mcg by mouth daily., Disp: , Rfl:    clopidogrel (PLAVIX) 75 MG tablet, Take 1 tablet (75 mg total) by mouth daily., Disp: 90 tablet, Rfl: 3    Coenzyme Q10 (COQ10 PO), Take 300 mg by mouth daily., Disp: , Rfl:    Glucosamine-Chondroitin 500-400 MG CAPS, Take 3 tablets by mouth daily., Disp: , Rfl:    ipratropium (ATROVENT) 0.06 % nasal spray, Place 2 sprays into both nostrils 2 (two) times daily as needed for rhinitis., Disp: 15 mL, Rfl: 5   metoprolol succinate (TOPROL-XL) 25 MG 24 hr tablet, Take 0.5 tablets (12.5 mg total) by mouth at bedtime., Disp: 15 tablet, Rfl: 0   Multiple Vitamins-Minerals (MENS 50+ MULTIVITAMIN) TABS, Take 1 tablet by mouth daily., Disp: , Rfl:    Omega-3 Fatty Acids (FISH OIL PO), Take 1,400 mg by mouth daily., Disp: , Rfl:    Probiotic Product (PROBIOTIC PO), Take 1 tablet by mouth daily., Disp: , Rfl:    tamsulosin (FLOMAX) 0.4 MG CAPS capsule, TAKE 1 CAPSULE(0.4 MG) BY MOUTH DAILY, Disp: 90 capsule, Rfl: 2   Objective:     Vitals:   04/05/23 1549  BP: 136/82  Pulse: 72  SpO2: 94%  Weight: 194 lb (88 kg)  Height: 6\' 1"  (1.854 m)      Body mass index is 25.6 kg/m.    Physical Exam:    Gen: Appears well, nad, nontoxic and pleasant Psych: Alert and oriented, appropriate mood and affect Neuro: Decreased sensation to left lateral lower extremity compared to right.  Otherwise sensation intact, strength is relatively 5/5 in upper and lower extremities, muscle tone generalized decreased Skin: no susupicious lesions or rashes   Back - Normal skin, Spine with normal alignment and no deformity.   No tenderness to vertebral process palpation.   Paraspinous muscles are not tender and without spasm Pain with lumbar extension and flexion    Electronically signed by:  [name] 4:38 PM 04/05/23

## 2023-04-05 ENCOUNTER — Ambulatory Visit: Payer: PPO | Admitting: Sports Medicine

## 2023-04-05 VITALS — BP 136/82 | HR 72 | Ht 73.0 in | Wt 194.0 lb

## 2023-04-05 DIAGNOSIS — R5381 Other malaise: Secondary | ICD-10-CM

## 2023-04-05 DIAGNOSIS — M48062 Spinal stenosis, lumbar region with neurogenic claudication: Secondary | ICD-10-CM

## 2023-04-05 DIAGNOSIS — G8929 Other chronic pain: Secondary | ICD-10-CM

## 2023-04-05 DIAGNOSIS — M51362 Other intervertebral disc degeneration, lumbar region with discogenic back pain and lower extremity pain: Secondary | ICD-10-CM

## 2023-04-05 NOTE — Patient Instructions (Signed)
 Core HEP  Tylenol [phone] mg 2-3 times a day for pain relief  4 week follow up

## 2023-05-03 ENCOUNTER — Ambulatory Visit: Admitting: Sports Medicine

## 2023-06-14 ENCOUNTER — Encounter: Payer: Self-pay | Admitting: Family Medicine

## 2023-06-14 ENCOUNTER — Ambulatory Visit (INDEPENDENT_AMBULATORY_CARE_PROVIDER_SITE_OTHER): Payer: Self-pay | Admitting: Family Medicine

## 2023-06-14 DIAGNOSIS — Z0000 Encounter for general adult medical examination without abnormal findings: Secondary | ICD-10-CM | POA: Diagnosis not present

## 2023-06-14 DIAGNOSIS — R2689 Other abnormalities of gait and mobility: Secondary | ICD-10-CM

## 2023-06-14 NOTE — Progress Notes (Signed)
 PATIENT CHECK-IN and HEALTH RISK ASSESSMENT QUESTIONNAIRE:  -completed by phone/video for upcoming Medicare Preventive Visit   Pre-Visit Check-in: 1)Vitals (height, wt, BP, etc) - record in vitals section for visit on day of visit Request home vitals (wt, BP, etc.) and enter into vitals, THEN update Vital Signs SmartPhrase below at the top of the HPI. See below.  2)Review and Update Medications, Allergies PMH, Surgeries, Social history in Epic 3)Hospitalizations in the last year with date/reason? Yes,  Heart attack Aug 2024 4)Review and Update Care Team (patient's specialists) in Epic 5) Complete PHQ9 in Epic  6) Complete Fall Screening in Epic 7)Review all Health Maintenance Due and order under PCP if not done.  Medicare Wellness Patient Questionnaire:  Answer theses question about your habits: How often do you have a drink containing alcohol?No  How many drinks containing alcohol do you have on a typical day when you are drinking?NA  How often do you have six or more drinks on one occasion?NA  Have you ever smoked?NO  Quit date if applicable? Na  How many packs a day do/did you smoke? NA  Do you use smokeless tobacco?NA  Do you use an illicit drugs?Na  On average, how many days per week do you engage in moderate to strenuous exercise (like a brisk walk)?yes, patient goes to the gym 4 days a week (40 minutes of cardio, does weight lifting as well every time he goes), and walks 1.5 miles on sat On average, how many minutes do you engage in exercise at this level?2 hours  Are you sexually active? No Number of partners?Na  Typical breakfast: Fruit, milk, cereal Typical lunch: Meat and Vegetables  Typical dinner: leftovers or soup  Typical snacks: nuts, popcorn   Beverages: Water  Answer theses question about your everyday activities: Can you perform most household chores?Yes  Are you deaf or have significant trouble hearing?some Do you feel that you have a problem with memory?Yes -  had evaluation in the past with a neurologist and was diagnosed with some mild cognitive impairment Do you feel safe at home?yes  Last dentist visit?last year  8. Do you have any difficulty performing your everyday activities?No  Are you having any difficulty walking, taking medications on your own, and or difficulty managing daily home needs?NO  Do you have difficulty walking or climbing stairs?Yes, balance  Do you have difficulty dressing or bathing?No Do you have difficulty doing errands alone such as visiting a doctor's office or shopping?NO  Do you currently have any difficulty preparing food and eating?NO  Do you currently have any difficulty using the toilet?NO  Do you have any difficulty managing your finances?No  Do you have any difficulties with housekeeping of managing your housekeeping?No   Do you have Advanced Directives in place (Living Will, Healthcare Power or Attorney)? Yes    Last eye Exam and location?unknown   Do you currently use prescribed or non-prescribed narcotic or opioid pain medications?No   Do you have a history or close family history of breast, ovarian, tubal or peritoneal cancer or a family member with BRCA (breast cancer susceptibility 1 and 2) gene mutations? No    Nurse/Assistant Credentials/time stamp: Jim Motts A.Wright CMA 4:35 pm    ----------------------------------------------------------------------------------------------------------------------------------------------------------------------------------------------------------------------  Because this visit was a virtual/telehealth visit, some criteria may be missing or patient reported. Any vitals not documented were not able to be obtained and vitals that have been documented are patient reported.    MEDICARE ANNUAL PREVENTIVE CARE VISIT WITH PROVIDER (Welcome to  Medicare, initial annual wellness or annual wellness exam)  Virtual Visit via Phone Note  I connected with Collin [name] on  06/14/23  by phone and verified that I am speaking with the correct person using two identifiers.Prefers phone visit.   Location patient: home Location provider:work or home office Persons participating in the virtual visit: patient, provider  Concerns and/or follow up today: doing ok, reports chronic issues with balance - has had PT in the past. Also feels stiffer ages. He wants to do PT again for the balance and flexibility issues.    See HM section in Epic for other details of completed HM.    ROS: negative for report of fevers, unintentional weight loss, vision changes, vision loss, hearing loss or change, chest pain, sob, hemoptysis, melena, hematochezia, hematuria, falls, bleeding or bruising, thoughts of suicide or self harm, memory loss  Patient-completed extensive health risk assessment - reviewed and discussed with the patient: See Health Risk Assessment completed with patient prior to the visit either above or in recent phone note. This was reviewed in detailed with the patient today and appropriate recommendations, orders and referrals were placed as needed per Summary below and patient instructions.   Review of Medical History: -PMH, PSH, Family History and current specialty and care providers reviewed and updated and listed below   Patient Care Team: [place], Betty G, MD as PCP - General (Family Medicine) Arnoldo Lapping, MD as PCP - Cardiology (Cardiology) Zilphia Hilt, Daria Eddy, [facility] (Pharmacist) Lbcardiology, Rounding, MD as Rounding Team (Cardiology)   Past Medical History:  Diagnosis Date   Allergy    Arthritis    BPH (benign prostatic hyperplasia)    CAD (coronary artery disease)    Cataract    Diverticulosis of colon    Hx of adenomatous colonic polyps 05/22/2014   Hyperlipidemia    Hypertension    MYOCARDIAL INFARCTION, HX OF 07/20/2006   Qualifier: Diagnosis of  By: Penney Bowling MD, Bruce      Past Surgical History:  Procedure Laterality Date   CATARACT EXTRACTION  Bilateral    Bil   COLONOSCOPY     CORONARY ARTERY BYPASS GRAFT N/A 09/01/2022   Procedure: CORONARY ARTERY BYPASS GRAFTING (CABG) times three using left internal mammary artery and right saphenous vein.;  Surgeon: Melene Sportsman, MD;  Location: [facility] OR;  Service: Open Heart Surgery;  Laterality: N/A;   CORONARY/GRAFT ACUTE MI REVASCULARIZATION N/A 09/01/2022   Procedure: Coronary/Graft Acute MI Revascularization;  Surgeon: Lucendia Rusk, MD;  Location: [facility] INVASIVE CV LAB;  Service: Cardiovascular;  Laterality: N/A;   IABP INSERTION N/A 09/01/2022   Procedure: IABP Insertion;  Surgeon: Lucendia Rusk, MD;  Location: [facility] INVASIVE CV LAB;  Service: Cardiovascular;  Laterality: N/A;   LEFT HEART CATH AND CORONARY ANGIOGRAPHY N/A 09/01/2022   Procedure: LEFT HEART CATH AND CORONARY ANGIOGRAPHY;  Surgeon: Lucendia Rusk, MD;  Location: [facility] INVASIVE CV LAB;  Service: Cardiovascular;  Laterality: N/A;   LUMBAR LAMINECTOMY/DECOMPRESSION MICRODISCECTOMY Left 01/20/2018   Procedure: Left Lumbar five Sacral one extraforaminal decompression;  Surgeon: Isadora Mar, MD;  Location: [facility] OR;  Service: Neurosurgery;  Laterality: Left;   PTCA     TEE WITHOUT CARDIOVERSION N/A 09/01/2022   Procedure: TRANSESOPHAGEAL ECHOCARDIOGRAM;  Surgeon: Melene Sportsman, MD;  Location: [facility] OR;  Service: Open Heart Surgery;  Laterality: N/A;    Social History   Socioeconomic History   Marital status: Married    Spouse name: Not on file   Number of children: 3  Years of education: 55   Highest education level: Not on file  Occupational History   Occupation: retired  Tobacco Use   Smoking status: Never   Smokeless tobacco: Never  Vaping Use   Vaping status: Never Used  Substance and Sexual Activity   Alcohol use: Yes    Comment: wine once a week and beer once a week   Drug use: No   Sexual activity: Not on file  Other Topics Concern   Not on file  Social History Narrative   Lives with wife in a 2 story  home.  Has 3 children.     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    Education: college.    Occasional beer, no smoking, no drugs   Social Drivers of Corporate investment banker Strain: Low Risk  (10/16/2021)   Overall Financial Resource Strain (CARDIA)    Difficulty of Paying Living Expenses: Not hard at all  Food Insecurity: No Food Insecurity (09/28/2022)   Hunger Vital Sign    Worried About Running Out of Food in the Last Year: Never true    Ran Out of Food in the Last Year: Never true  Transportation Needs: No Transportation Needs (09/28/2022)   PRAPARE - Administrator, Civil Service (Medical): No    Lack of Transportation (Non-Medical): No  Physical Activity: Sufficiently Active (03/03/2021)   Exercise Vital Sign    Days of Exercise per Week: 6 days    Minutes of Exercise per Session: 90 min  Stress: No Stress Concern Present (03/03/2021)   [employer] of Occupational Health - Occupational Stress Questionnaire    Feeling of Stress : Not at all  Social Connections: Moderately Integrated (01/03/2020)   Social Connection and Isolation Panel [NHANES]    Frequency of Communication with Friends and Family: More than three times a week    Frequency of Social Gatherings with Friends and Family: Three times a week    Attends Religious Services: More than 4 times per year    Active Member of Clubs or Organizations: No    Attends Banker Meetings: Never    Marital Status: Married  Catering manager Violence: Not At Risk (09/03/2022)   Humiliation, Afraid, Rape, and Kick questionnaire    Fear of Current or Ex-Partner: No    Emotionally Abused: No    Physically Abused: No    Sexually Abused: No    Family History  Problem Relation Age of Onset   Heart disease Mother        CABG   Dementia Mother 68   Stroke Father    Heart disease Father    Kidney disease Father        renal failure   AAA (abdominal aortic aneurysm) Father     Hypertension Sister    Diabetes Brother    AAA (abdominal aortic aneurysm) Paternal Uncle    Breast cancer Daughter    Colon cancer Neg Hx     Current Outpatient Medications on File Prior to Visit  Medication Sig Dispense Refill   ascorbic acid  (VITAMIN C ) 500 MG tablet Take 0.5 tablets (250 mg total) by mouth daily. 100 tablet 0   aspirin  81 MG tablet Take 1 tablet (81 mg total) by mouth at bedtime. (Patient taking differently: Take 81 mg by mouth daily.) 30 tablet    atorvastatin  (LIPITOR ) 80 MG tablet Take 1 tablet (80 mg total) by mouth daily. 90 tablet 3   Cholecalciferol (VITAMIN D-3)  125 MCG (5000 UT) TABS Take 125 mcg by mouth daily.     clopidogrel  (PLAVIX ) 75 MG tablet Take 1 tablet (75 mg total) by mouth daily. 90 tablet 3   Coenzyme Q10 (COQ10 PO) Take 300 mg by mouth daily.     Glucosamine-Chondroitin 500-400 MG CAPS Take 3 tablets by mouth daily.     metoprolol  succinate (TOPROL -XL) 25 MG 24 hr tablet Take 0.5 tablets (12.5 mg total) by mouth at bedtime. 15 tablet 0   Multiple Vitamins-Minerals (MENS 50+ MULTIVITAMIN) TABS Take 1 tablet by mouth daily.     Omega-3 Fatty Acids (FISH OIL PO) Take 1,400 mg by mouth daily.     Probiotic Product (PROBIOTIC PO) Take 1 tablet by mouth daily.     tamsulosin  (FLOMAX ) 0.4 MG CAPS capsule TAKE 1 CAPSULE(0.4 MG) BY MOUTH DAILY 90 capsule 2   ipratropium (ATROVENT ) 0.06 % nasal spray Place 2 sprays into both nostrils 2 (two) times daily as needed for rhinitis. (Patient not taking: Reported on 06/14/2023) 15 mL 5   No current facility-administered medications on file prior to visit.    Allergies  Allergen Reactions   Penicillins Rash    Immediate rash       Physical Exam Vitals requested from patient and listed below if patient had equipment and was able to obtain at home for this virtual visit: There were no vitals filed for this visit. Estimated body mass index is 25.6 kg/m as calculated from the following:   Height as of  04/05/23: 6\' 1"  (1.854 m).   Weight as of 04/05/23: 194 lb (88 kg).  EKG (optional): deferred due to virtual visit  GENERAL: alert, oriented, no acute distress detected; full vision exam deferred due to pandemic and/or virtual encounter  PSYCH/NEURO: pleasant and cooperative, no obvious depression or anxiety, speech and thought processing grossly intact, Cognitive function grossly intact  Flowsheet Row Office Visit from 06/14/2023 in [facility] [place]  PHQ-9 Total Score 0           06/14/2023    4:19 PM 01/21/2023    1:09 PM 11/11/2022   10:13 AM 03/04/2022    3:54 PM 01/05/2022    3:02 PM  Depression screen PHQ 2/9  Decreased Interest 0 1 0 0 0  Down, Depressed, Hopeless 0 0 0 0 0  PHQ - 2 Score 0 1 0 0 0  Altered sleeping 0 0 1 0   Tired, decreased energy 0 0 1 0   Change in appetite 0 0 0 0   Feeling bad or failure about yourself  0 0 0 0   Trouble concentrating 0 0 0 1   Moving slowly or fidgety/restless 0 0 1 0   Suicidal thoughts 0 0 0 0   PHQ-9 Score 0 1 3 1    Difficult doing work/chores  Not difficult at all Not difficult at all Not difficult at all        01/14/2023   12:59 PM 01/17/2023   12:25 PM 01/21/2023   12:55 PM 01/24/2023    1:00 PM 06/14/2023    4:19 PM  Fall Risk  Falls in the past year? 0 0 0  0  Was there an injury with Fall? 0 0 0 0 0  Fall Risk Category Calculator 0 0 0  0  Patient at Risk for Falls Due to Impaired balance/gait;Impaired mobility;Orthopedic patient Impaired balance/gait;Impaired mobility;Orthopedic patient Impaired balance/gait;Impaired mobility;Orthopedic patient Impaired mobility;Impaired balance/gait;Orthopedic patient No Fall Risks  Fall risk Follow up Falls evaluation completed Falls evaluation completed Falls evaluation completed Falls evaluation completed Falls evaluation completed     SUMMARY AND PLAN:  Encounter for Medicare annual wellness exam  Balance problem - Plan: Ambulatory referral to  Physical Therapy  Discussed applicable health maintenance/preventive health measures and advised and referred or ordered per patient preferences: -discussed vaccines due risks/benefits and advised can get at the pharmacy, he agrees to let us  know if he does so that we can update record Health Maintenance  Topic Date Due   COVID-19 Vaccine (6 - 2024-25 season) 09/26/2022   INFLUENZA VACCINE  08/26/2023   Medicare Annual Wellness (AWV)  06/13/2024   Pneumonia Vaccine 67+ Years old  Completed   Zoster Vaccines- Shingrix  Completed   HPV VACCINES  Aged Out   Meningococcal B Vaccine  Aged Out   DTaP/Tdap/Td  Discontinued   Colonoscopy  Discontinued     Education and counseling on the following was provided based on the above review of health and a plan/checklist for the patient, along with additional information discussed, was provided for the patient in the patient instructions :  -Provided counseling and plan for increased risk of falling if applicable per above screening. Reviewed and demonstrated safe balance exercises that can be done at home to improve balance and discussed exercise guidelines for adults with include balance exercises at least 3 days per week. Sent requested PT referral.  -Advised and counseled on a healthy lifestyle - including the importance of a healthy diet, regular physical activity, social connections and stress management. -Reviewed patient's current diet. Advised and counseled on a whole foods based healthy diet. A summary of a healthy diet was provided in the Patient Instructions.  -reviewed patient's current physical activity level and discussed exercise guidelines for adults. Congratulated on current healthy habits and discussed balance and flexibility exercises he could add. He wished to see PT for this as well and referral placed.  -Advise yearly dental visits at minimum and regular eye exams -Advised and counseled on alcohol safe limits, risks/ tobacco use,  risks of smoking and offered counseling/help,   Follow up: see patient instructions   There are no Patient Instructions on file for this visit.  Maurie Southern, DO

## 2023-06-14 NOTE — Progress Notes (Signed)
 Patient was unable to self-report due to a lack of equipment at home via telehealth

## 2023-06-14 NOTE — Patient Instructions (Signed)
 I really enjoyed getting to talk with you today! I am available on Tuesdays and Thursdays for virtual visits if you have any questions or concerns, or if I can be of any further assistance.   CHECKLIST FROM ANNUAL WELLNESS VISIT:  -Follow up (please call to schedule if not scheduled after visit):   -yearly for annual wellness visit with primary care office    -sent referral for physical therapy - please call if you have not been contacted regarding this in the next 1-2 weeks.   Here is a list of your preventive care/health maintenance measures and the plan for each if any are due:  PLAN For any measures below that may be due:   Health Maintenance  Topic Date Due   COVID-19 Vaccine (6 - 2024-25 season) 09/26/2022   INFLUENZA VACCINE  08/26/2023   Medicare Annual Wellness (AWV)  06/13/2024   Pneumonia Vaccine 27+ Years old  Completed   Zoster Vaccines- Shingrix  Completed   HPV VACCINES  Aged Out   Meningococcal B Vaccine  Aged Out   DTaP/Tdap/Td  Discontinued   Colonoscopy  Discontinued    -See a dentist at least yearly  -Get your eyes checked and then per your eye specialist's recommendations  -Other issues addressed today:   -I have included below further information regarding a healthy whole foods based diet, physical activity guidelines for adults, stress management and opportunities for social connections. I hope you find this information useful.   -----------------------------------------------------------------------------------------------------------------------------------------------------------------------------------------------------------------------------------------------------------    NUTRITION: -eat real food: lots of colorful vegetables (half the plate) and fruits -5-7 servings of vegetables and fruits per day (fresh or steamed is best), exp. 2 servings of vegetables with lunch and dinner and 2 servings of fruit per day. Berries and greens such as kale  and collards are great choices.  -consume on a regular basis:  fresh fruits, fresh veggies, fish, nuts, seeds, healthy oils (such as olive oil, avocado oil), whole grains (make sure for bread/pasta/crackers/etc., that the first ingredient on label contains the word "whole"), legumes. -can eat small amounts of dairy and lean meat (no larger than the palm of your hand), but avoid processed meats such as ham, bacon, lunch meat, etc. -drink water -try to avoid fast food and pre-packaged foods, processed meat, ultra processed foods/beverages (donuts, candy, etc.) -most experts advise limiting sodium to < 2300mg  per day, should limit further is any chronic conditions such as high blood pressure, heart disease, diabetes, etc. The American Heart Association advised that < 1500mg  is is ideal -try to avoid foods/beverages that contain any ingredients with names you do not recognize  -try to avoid foods/beverages  with added sugar or sweeteners/sweets  -try to avoid sweet drinks (including diet drinks): soda, juice, Gatorade, sweet tea, power drinks, diet drinks -try to avoid white rice, white bread, pasta (unless whole grain)  EXERCISE GUIDELINES FOR ADULTS: -if you wish to increase your physical activity, do so gradually and with the approval of your doctor -STOP and seek medical care immediately if you have any chest pain, chest discomfort or trouble breathing when starting or increasing exercise  -move and stretch your body, legs, feet and arms when sitting for long periods -Physical activity guidelines for optimal health in adults: -get at least 150 minutes per week of moderate exercise (can talk, but not sing); this is about 20-30 minutes of sustained activity 5-7 days per week or two 10-15 minute episodes of sustained activity 5-7 days per week -do some muscle building/resistance training/strength  training at least 2 days per week  -balance exercises 3+ days per week:   Stand somewhere where you have  something sturdy to hold onto if you lose balance    1) lift up on toes, then back down, start with 5x per day and work up to 20x   2) stand and lift one leg straight out to the side so that foot is a few inches of the floor, start with 5x each side and work up to 20x each side   3) stand on one foot, start with 5 seconds each side and work up to 20 seconds on each side  If you need ideas or help with getting more active:  -Silver sneakers https://tools.silversneakers.com  -Walk with a Doc: http://www.duncan-williams.com/  -try to include resistance (weight lifting/strength building) and balance exercises twice per week: or the following link for ideas: http://castillo-powell.com/  BuyDucts.dk  STRESS MANAGEMENT: -can try meditating, or just sitting quietly with deep breathing while intentionally relaxing all parts of your body for 5 minutes daily -if you need further help with stress, anxiety or depression please follow up with your primary doctor or contact the wonderful folks at [facility]: [phone]  SOCIAL CONNECTIONS: -options in [place] if you wish to engage in more social and exercise related activities:  -Silver sneakers https://tools.silversneakers.com  -Walk with a Doc: http://www.duncan-williams.com/  -Check out the [facility] Active Adults 50+ section on the [place] of [employer] (hiking clubs, book clubs, cards and games, chess, exercise classes, aquatic classes and much more) - see the website for details: https://www.[place]-.gov/departments/parks-recreation/active-adults50  -YouTube has lots of exercise videos for different ages and abilities as well  -[facility] (a variety of indoor and outdoor inperson activities for adults). (478)052-4998. [address].  -Virtual Online Classes (a variety of topics): see seniorplanet.org or call  [phone])699-3604  -consider volunteering at a school, hospice center, church, senior center or elsewhere

## 2023-06-25 ENCOUNTER — Other Ambulatory Visit: Payer: Self-pay | Admitting: Family Medicine

## 2023-06-25 DIAGNOSIS — N401 Enlarged prostate with lower urinary tract symptoms: Secondary | ICD-10-CM

## 2023-07-21 ENCOUNTER — Telehealth: Payer: Self-pay | Admitting: Cardiovascular Disease

## 2023-07-21 NOTE — Telephone Encounter (Signed)
 Left message for pt to call.

## 2023-07-21 NOTE — Telephone Encounter (Signed)
 Patient stated he had a health visit today and wants a call back to discuss results.

## 2023-07-21 NOTE — Telephone Encounter (Signed)
 Pt calling back

## 2023-07-21 NOTE — Telephone Encounter (Signed)
 Spoke with pt. Pt said he had a home health nurse tell him he may have a small murmer and it was nothing to worry about. He was just calling to let us  know, but I saw he was at time for 6 month follow up so we went ahead and scheduled a appointment with Dr Wonda.  Pt will call back if he needs anything else before at appointment.

## 2023-07-22 ENCOUNTER — Telehealth: Payer: Self-pay | Admitting: Neurology

## 2023-07-22 NOTE — Telephone Encounter (Signed)
 Called patient and informed him of Dr. Collie advice that He has twice declined neurocognitive testing. We can either just get him scheduled for that or schedule him an appt with sara.  Patient would like an appt with Camie and is aware that I will have someone from the front to give him a call. Patient verbalized understanding and had no further questions or concerns.

## 2023-07-22 NOTE — Telephone Encounter (Signed)
 Pt wants to make an appt with tat as a f/u. He was seen in 2024.he said he has cognitive issues and shaking in his hands now. He would like to know if he can make a f/u to see her

## 2023-08-17 ENCOUNTER — Ambulatory Visit

## 2023-08-17 ENCOUNTER — Ambulatory Visit: Admitting: Physical Therapy

## 2023-08-23 NOTE — Therapy (Unsigned)
 OUTPATIENT PHYSICAL THERAPY LOWER EXTREMITY EVALUATION   Patient Name: Collin [name] MRN: [id number] DOB:1940/08/26, 83 y.o., male Today's Date: 08/24/2023  END OF SESSION:  PT End of Session - 08/24/23 1318     Visit Number 1    Number of Visits 6    Date for PT Re-Evaluation 10/25/23    Authorization Type HTA    PT Start Time 1045    PT Stop Time 1130    PT Time Calculation (min) 45 min    Activity Tolerance Patient tolerated treatment well;No increased pain    Behavior During Therapy WFL for tasks assessed/performed          Past Medical History:  Diagnosis Date   Allergy    Arthritis    BPH (benign prostatic hyperplasia)    CAD (coronary artery disease)    Cataract    Diverticulosis of colon    Hx of adenomatous colonic polyps 05/22/2014   Hyperlipidemia    Hypertension    MYOCARDIAL INFARCTION, HX OF 07/20/2006   Qualifier: Diagnosis of  By: Tammie MD, Bruce     Past Surgical History:  Procedure Laterality Date   CATARACT EXTRACTION Bilateral    Bil   COLONOSCOPY     CORONARY ARTERY BYPASS GRAFT N/A 09/01/2022   Procedure: CORONARY ARTERY BYPASS GRAFTING (CABG) times three using left internal mammary artery and right saphenous vein.;  Surgeon: Maryjane Mt, MD;  Location: [facility] OR;  Service: Open Heart Surgery;  Laterality: N/A;   CORONARY/GRAFT ACUTE MI REVASCULARIZATION N/A 09/01/2022   Procedure: Coronary/Graft Acute MI Revascularization;  Surgeon: Dann Candyce RAMAN, MD;  Location: [facility] INVASIVE CV LAB;  Service: Cardiovascular;  Laterality: N/A;   IABP INSERTION N/A 09/01/2022   Procedure: IABP Insertion;  Surgeon: Dann Candyce RAMAN, MD;  Location: [facility] INVASIVE CV LAB;  Service: Cardiovascular;  Laterality: N/A;   LEFT HEART CATH AND CORONARY ANGIOGRAPHY N/A 09/01/2022   Procedure: LEFT HEART CATH AND CORONARY ANGIOGRAPHY;  Surgeon: Dann Candyce RAMAN, MD;  Location: [facility] INVASIVE CV LAB;  Service: Cardiovascular;  Laterality: N/A;   LUMBAR  LAMINECTOMY/DECOMPRESSION MICRODISCECTOMY Left 01/20/2018   Procedure: Left Lumbar five Sacral one extraforaminal decompression;  Surgeon: Joshua Alm RAMAN, MD;  Location: [facility] OR;  Service: Neurosurgery;  Laterality: Left;   PTCA     TEE WITHOUT CARDIOVERSION N/A 09/01/2022   Procedure: TRANSESOPHAGEAL ECHOCARDIOGRAM;  Surgeon: Maryjane Mt, MD;  Location: [facility] OR;  Service: Open Heart Surgery;  Laterality: N/A;   Patient Active Problem List   Diagnosis Date Noted   Rhinitis, non-allergic 01/07/2023   Orthostatic hypotension 09/14/2022   Debility 09/09/2022   STEMI (ST elevation myocardial infarction) (HCC) 09/01/2022   S/P CABG x 3 09/01/2022   Right shoulder pain 01/05/2022   Routine general medical examination at a health care facility 01/05/2022   Prediabetes 01/05/2022   S/P lumbar laminectomy 01/20/2018   Essential tremor 11/02/2017   Unstable gait 11/02/2017   Lumbar radiculopathy 07/19/2017   Peripheral neuropathy 12/06/2016   Hx of adenomatous colonic polyps 05/22/2014   Left inguinal hernia 09/10/2011   PSA, INCREASED 08/05/2009   CONGENITAL PIGMENTARY ANOMALY OF SKIN 07/25/2007   Dyslipidemia 07/20/2006   Coronary atherosclerosis 07/20/2006   BPH associated with nocturia 07/20/2006    PCP: [place], Betty G, MD   REFERRING PROVIDER: Luke Chiquita SAUNDERS, DO  REFERRING DIAG: R26.89 (ICD-10-CM) - Balance problem  THERAPY DIAG:  Abnormality of gait and mobility  Unsteadiness on feet  Spinal stenosis of lumbar region with neurogenic claudication  Rationale for Evaluation and Treatment: Rehabilitation  ONSET DATE: chronic  SUBJECTIVE:   SUBJECTIVE STATEMENT: Patient arrives to PT with c/o balance disturbance which he feels is related to weakness.  Weakness began following MI 8/24, has been active at the gym.  Has been through cardiac rehab, ending in 12/24.   Previous MD appointment in 3/25 shows a history of spinal stenosis.  Patient acknowledges decreased sensation in B  legs and wears a L foot up brace to control foot drop.  Standing tolerance limite to 5 minutes but walking tolerance essentially unrestricted.   PERTINENT HISTORY: none PAIN:  Are you having pain? No  PRECAUTIONS: None  RED FLAGS: None   WEIGHT BEARING RESTRICTIONS: No  FALLS:  Has patient fallen in last 6 months? Yes. Number of falls 0  OCCUPATION: retired  PLOF: Independent  PATIENT GOALS: To improve my balance  NEXT MD VISIT: TBD  OBJECTIVE:  Note: Objective measures were completed at Evaluation unless otherwise noted.  DIAGNOSTIC FINDINGS: none  PATIENT SURVEYS:  Patient-specific activity scoring scheme (Point to one number):  0 represents "unable to perform." 10 represents "able to perform at prior level. 0 1 2 3 4 5 6 7 8 9  10 (Date and Score) Activity Initial  Activity Eval     Standing >5 min  5    Stair climbing  7     Arising from sitting w/o arms  1     Total score = sum of the activity scores/number of activities Minimum detectable change (90%CI) for average score = 2 points Minimum detectable change (90%CI) for single activity score = 3 points PSFS developed by: Rosalee MYRTIS Marvis KYM Charlet CHRISTELLA., & Binkley, J. (1995). Assessing disability and change on individual  patients: a report of a patient specific measure. Physiotherapy [place], 47, [phone]. Reproduced with the permission of the authors  Score: 13/30   MUSCLE LENGTH: deferred  POSTURE: flexed posture  PALPATION: deferred  LOWER EXTREMITY ROM: WNL  Active ROM Right eval Left eval  Hip flexion    Hip extension    Hip abduction    Hip adduction    Hip internal rotation    Hip external rotation    Knee flexion    Knee extension    Ankle dorsiflexion    Ankle plantarflexion    Ankle inversion    Ankle eversion     (Blank rows = not tested)  LOWER EXTREMITY MMT: see 5x STS   MMT Right eval Left eval  Hip flexion    Hip extension    Hip abduction    Hip  adduction    Hip internal rotation    Hip external rotation    Knee flexion    Knee extension    Ankle dorsiflexion    Ankle plantarflexion    Ankle inversion    Ankle eversion     (Blank rows = not tested)  LOWER EXTREMITY SPECIAL TESTS:  deferred  FUNCTIONAL TESTS:   5x STS 12 s arms crossed  DTR's:  Unable to elicit BLE reflexes   GAIT: Distance walked: 86ft x2 Assistive device utilized: None Level of assistance: Complete Independence Comments: unsteady gait, furniture surfs, ataxic due to diminished sensation throughout LEs  TREATMENT:  [facility] Adult PT Treatment:                                                DATE: 08/24/23  Self Care: Additional minutes spent for educating on updated Therapeutic Home Exercise Program as well as comparing current status to condition at start of symptoms. This included exercises focusing on stretching, strengthening, with focus on eccentric aspects. Long term goals include an improvement in range of motion, strength, endurance as well as avoiding reinjury. Patient's frequency would include in 1-2 times a day, 3-5 times a week for a duration of 6-12 weeks. Proper technique shown and discussed handout in great detail. All questions were discussed and addressed.      PATIENT EDUCATION:  Education details: Discussed eval findings, rehab rationale and POC and patient is in agreement  Person educated: Patient Education method: Explanation and Handouts Education comprehension: verbalized understanding and needs further education  HOME EXERCISE PROGRAM:    Access Code: [email or web address] URL: [email or web address] Date: 08/24/2023 Prepared by: Reyes Kohut  Exercises - Standard Plank  - 1 x daily - 5 x weekly - 1 sets - 2 reps - 30s hold - Supine 90/90 Abdominal Bracing  - 1 x daily - 5 x weekly - 1 sets - 2  reps - 60s hold  ASSESSMENT:  CLINICAL IMPRESSION: Patient is a 83 y.o. male who was seen today for physical therapy evaluation and treatment for BLE pain/weakness.  Patient's referral described balance issues but prior MD notes indicate diagnosis of spinal stenosis with neurogenic claudication.  Patient's symptoms include lower extremity weakness and global loss of sensation in bilateral lower extremities with a history of left foot drop which patient corrects with a foot up brace.  Patient presents with good range of motion throughout lower extremities, light touch diminished in both lower extremities with DTRs absent in both legs.  Patient is able to maintain 90/90 position for 30 seconds as well as perform 5 times sit to stand and 12 seconds which indicate good strength for patient's age group.  Patient demonstrates an ataxic gait with occasional need of furniture and wall assist to maintain stability.  He does not use an assistive device.  Recommend physical therapy once a week for 6-week.  With the focus on core strengthening to offset effects of spinal stenosis and neural claudication.  If symptoms persist recommend patient follow-up with spine surgeon to assess further  OBJECTIVE IMPAIRMENTS: Abnormal gait, decreased activity tolerance, decreased balance, decreased coordination, decreased endurance, decreased knowledge of condition, decreased mobility, difficulty walking, and decreased strength.   ACTIVITY LIMITATIONS: carrying, lifting, standing, and stairs  PERSONAL FACTORS: Age, Fitness, Time since onset of injury/illness/exacerbation, and 1 comorbidity: spinal stenosis are also affecting patient's functional outcome.   REHAB POTENTIAL: Good  CLINICAL DECISION MAKING: Stable/uncomplicated  EVALUATION COMPLEXITY: Low   GOALS: Goals reviewed with patient? No   LONG TERM GOALS: Target date: 10/25/23  Patient to demonstrate independence in HEP  Baseline: [email or web address] Goal status:  INITIAL  2.  Increase standing tolerance to >5 min Baseline: 5 min Goal status: INITIAL  3.  Patient will score at least 19/30 on PSFS to signify clinically meaningful improvement in functional abilities.   Baseline: 13/30 Goal status: INITIAL  4.  Assess 2 MWT and establish goal Baseline: TBD Goal status: INITIAL  5.  Able to hold a  standard plank for 60s Baseline: 30s Goal status: INITIAL     PLAN:  PT FREQUENCY: 1-2x/week  PT DURATION: 6 weeks  PLANNED INTERVENTIONS: 97110-Therapeutic exercises, 97530- Therapeutic activity, 97112- Neuromuscular re-education, 97535- Self Care, [id number]- Manual therapy, [phone]- Gait training, Patient/Family education, Balance training, and Stair training  PLAN FOR NEXT SESSION: HEP review and update, manual techniques as appropriate, aerobic tasks, ROM and flexibility activities, strengthening and PREs, TPDN, gait and balance training as needed     Reyes CHRISTELLA Kohut, PT 08/24/2023, 1:19 PM

## 2023-08-24 ENCOUNTER — Other Ambulatory Visit: Payer: Self-pay

## 2023-08-24 ENCOUNTER — Ambulatory Visit: Attending: Family Medicine

## 2023-08-24 DIAGNOSIS — R2689 Other abnormalities of gait and mobility: Secondary | ICD-10-CM | POA: Insufficient documentation

## 2023-08-24 DIAGNOSIS — R2681 Unsteadiness on feet: Secondary | ICD-10-CM | POA: Diagnosis not present

## 2023-08-24 DIAGNOSIS — M48062 Spinal stenosis, lumbar region with neurogenic claudication: Secondary | ICD-10-CM | POA: Diagnosis not present

## 2023-08-24 DIAGNOSIS — R269 Unspecified abnormalities of gait and mobility: Secondary | ICD-10-CM | POA: Diagnosis not present

## 2023-08-30 ENCOUNTER — Other Ambulatory Visit: Payer: Self-pay | Admitting: Cardiovascular Disease

## 2023-08-31 ENCOUNTER — Ambulatory Visit: Attending: Family Medicine

## 2023-08-31 DIAGNOSIS — R2681 Unsteadiness on feet: Secondary | ICD-10-CM | POA: Diagnosis not present

## 2023-08-31 DIAGNOSIS — R269 Unspecified abnormalities of gait and mobility: Secondary | ICD-10-CM | POA: Diagnosis not present

## 2023-08-31 DIAGNOSIS — M48062 Spinal stenosis, lumbar region with neurogenic claudication: Secondary | ICD-10-CM | POA: Insufficient documentation

## 2023-08-31 NOTE — Therapy (Signed)
 OUTPATIENT PHYSICAL THERAPY TREATMENT NOTE   Patient Name: Collin [name] MRN: [id number] DOB:1940-10-24, 83 y.o., male Today's Date: 08/31/2023  END OF SESSION:  PT End of Session - 08/31/23 1613     Visit Number 2    Number of Visits 6    Date for PT Re-Evaluation 10/25/23    Authorization Type HTA    PT Start Time 1615    PT Stop Time 1655    PT Time Calculation (min) 40 min    Activity Tolerance Patient tolerated treatment well;No increased pain    Behavior During Therapy WFL for tasks assessed/performed           Past Medical History:  Diagnosis Date   Allergy    Arthritis    BPH (benign prostatic hyperplasia)    CAD (coronary artery disease)    Cataract    Diverticulosis of colon    Hx of adenomatous colonic polyps 05/22/2014   Hyperlipidemia    Hypertension    MYOCARDIAL INFARCTION, HX OF 07/20/2006   Qualifier: Diagnosis of  By: Tammie MD, Bruce     Past Surgical History:  Procedure Laterality Date   CATARACT EXTRACTION Bilateral    Bil   COLONOSCOPY     CORONARY ARTERY BYPASS GRAFT N/A 09/01/2022   Procedure: CORONARY ARTERY BYPASS GRAFTING (CABG) times three using left internal mammary artery and right saphenous vein.;  Surgeon: Maryjane Mt, MD;  Location: [facility] OR;  Service: Open Heart Surgery;  Laterality: N/A;   CORONARY/GRAFT ACUTE MI REVASCULARIZATION N/A 09/01/2022   Procedure: Coronary/Graft Acute MI Revascularization;  Surgeon: Dann Candyce RAMAN, MD;  Location: [facility] INVASIVE CV LAB;  Service: Cardiovascular;  Laterality: N/A;   IABP INSERTION N/A 09/01/2022   Procedure: IABP Insertion;  Surgeon: Dann Candyce RAMAN, MD;  Location: [facility] INVASIVE CV LAB;  Service: Cardiovascular;  Laterality: N/A;   LEFT HEART CATH AND CORONARY ANGIOGRAPHY N/A 09/01/2022   Procedure: LEFT HEART CATH AND CORONARY ANGIOGRAPHY;  Surgeon: Dann Candyce RAMAN, MD;  Location: [facility] INVASIVE CV LAB;  Service: Cardiovascular;  Laterality: N/A;   LUMBAR LAMINECTOMY/DECOMPRESSION  MICRODISCECTOMY Left 01/20/2018   Procedure: Left Lumbar five Sacral one extraforaminal decompression;  Surgeon: Joshua Alm RAMAN, MD;  Location: [facility] OR;  Service: Neurosurgery;  Laterality: Left;   PTCA     TEE WITHOUT CARDIOVERSION N/A 09/01/2022   Procedure: TRANSESOPHAGEAL ECHOCARDIOGRAM;  Surgeon: Maryjane Mt, MD;  Location: [facility] OR;  Service: Open Heart Surgery;  Laterality: N/A;   Patient Active Problem List   Diagnosis Date Noted   Rhinitis, non-allergic 01/07/2023   Orthostatic hypotension 09/14/2022   Debility 09/09/2022   STEMI (ST elevation myocardial infarction) (HCC) 09/01/2022   S/P CABG x 3 09/01/2022   Right shoulder pain 01/05/2022   Routine general medical examination at a health care facility 01/05/2022   Prediabetes 01/05/2022   S/P lumbar laminectomy 01/20/2018   Essential tremor 11/02/2017   Unstable gait 11/02/2017   Lumbar radiculopathy 07/19/2017   Peripheral neuropathy 12/06/2016   Hx of adenomatous colonic polyps 05/22/2014   Left inguinal hernia 09/10/2011   PSA, INCREASED 08/05/2009   CONGENITAL PIGMENTARY ANOMALY OF SKIN 07/25/2007   Dyslipidemia 07/20/2006   Coronary atherosclerosis 07/20/2006   BPH associated with nocturia 07/20/2006    PCP: [place], Betty G, MD   REFERRING PROVIDER: Luke Chiquita SAUNDERS, DO  REFERRING DIAG: R26.89 (ICD-10-CM) - Balance problem  THERAPY DIAG:  Abnormality of gait and mobility  Unsteadiness on feet  Spinal stenosis of lumbar region with neurogenic claudication  Rationale for Evaluation and Treatment: Rehabilitation  ONSET DATE: chronic  SUBJECTIVE:   SUBJECTIVE STATEMENT:  Continues with ataxic like gait due to weakness, loss of sensation and impaired coordination.   Patient arrives to PT with c/o balance disturbance which he feels is related to weakness.  Weakness began following MI 8/24, has been active at the gym.  Has been through cardiac rehab, ending in 12/24.   Previous MD appointment in 3/25 shows a  history of spinal stenosis.  Patient acknowledges decreased sensation in B legs and wears a L foot up brace to control foot drop.  Standing tolerance limited to 5 minutes but walking tolerance essentially unrestricted.   PERTINENT HISTORY: none PAIN:  Are you having pain? No  PRECAUTIONS: None  RED FLAGS: None   WEIGHT BEARING RESTRICTIONS: No  FALLS:  Has patient fallen in last 6 months? Yes. Number of falls 0  OCCUPATION: retired  PLOF: Independent  PATIENT GOALS: To improve my balance  NEXT MD VISIT: TBD  OBJECTIVE:  Note: Objective measures were completed at Evaluation unless otherwise noted.  DIAGNOSTIC FINDINGS: none  PATIENT SURVEYS:  Patient-specific activity scoring scheme (Point to one number):  0 represents "unable to perform." 10 represents "able to perform at prior level. 0 1 2 3 4 5 6 7 8 9  10 (Date and Score) Activity Initial  Activity Eval     Standing >5 min  5    Stair climbing  7     Arising from sitting w/o arms  1     Total score = sum of the activity scores/number of activities Minimum detectable change (90%CI) for average score = 2 points Minimum detectable change (90%CI) for single activity score = 3 points PSFS developed by: Rosalee MYRTIS Marvis KYM Charlet CHRISTELLA., & Binkley, J. (1995). Assessing disability and change on individual  patients: a report of a patient specific measure. Physiotherapy [place], 47, [phone]. Reproduced with the permission of the authors  Score: 13/30   MUSCLE LENGTH: deferred  POSTURE: flexed posture  PALPATION: deferred  LOWER EXTREMITY ROM: WNL  Active ROM Right eval Left eval  Hip flexion    Hip extension    Hip abduction    Hip adduction    Hip internal rotation    Hip external rotation    Knee flexion    Knee extension    Ankle dorsiflexion    Ankle plantarflexion    Ankle inversion    Ankle eversion     (Blank rows = not tested)  LOWER EXTREMITY MMT: see 5x STS   MMT  Right eval Left eval  Hip flexion    Hip extension    Hip abduction    Hip adduction    Hip internal rotation    Hip external rotation    Knee flexion    Knee extension    Ankle dorsiflexion    Ankle plantarflexion    Ankle inversion    Ankle eversion     (Blank rows = not tested)  LOWER EXTREMITY SPECIAL TESTS:  deferred  FUNCTIONAL TESTS:   5x STS 12 s arms crossed  DTR's:  Unable to elicit BLE reflexes   GAIT: Distance walked: 60ft x2 Assistive device utilized: None Level of assistance: Complete Independence Comments: unsteady gait, furniture surfs, ataxic due to diminished sensation throughout LEs  TREATMENT:  [facility] Adult PT Treatment:                                                DATE: 08/31/23  Neuromuscular re-ed:   08/31/23 0001  Berg Balance Test  Sit to Stand 4  Standing Unsupported 4  Sitting with Back Unsupported but Feet Supported on Floor or Stool 4  Stand to Sit 4  Transfers 4  Standing Unsupported with Eyes Closed 4  Standing Unsupported with Feet Together 4  From Standing, Reach Forward with Outstretched Arm 4  From Standing Position, Pick up Object from Floor 4  From Standing Position, Turn to Look Behind Over each Shoulder 4  Turn 360 Degrees 2  Standing Unsupported, Alternately Place Feet on Step/Stool 2  Standing Unsupported, One Foot in Front 3  Standing on One Leg 1  Total Score 48   Therapeutic Activity: Supine hip fallouts BluTB 15x B, 15/15 unilaterally S/L clams BluTB  Bridge against BluTB P-ball curl ups 15x B, 15/15 unilaterally Self Care: Dicussion of spinal stenosis and effect on BLE symptoms.   [facility] Adult PT Treatment:                                                DATE: 08/24/23  Self Care: Additional minutes spent for educating on updated Therapeutic Home Exercise Program as well as comparing  current status to condition at start of symptoms. This included exercises focusing on stretching, strengthening, with focus on eccentric aspects. Long term goals include an improvement in range of motion, strength, endurance as well as avoiding reinjury. Patient's frequency would include in 1-2 times a day, 3-5 times a week for a duration of 6-12 weeks. Proper technique shown and discussed handout in great detail. All questions were discussed and addressed.      PATIENT EDUCATION:  Education details: Discussed eval findings, rehab rationale and POC and patient is in agreement  Person educated: Patient Education method: Explanation and Handouts Education comprehension: verbalized understanding and needs further education  HOME EXERCISE PROGRAM:    Access Code: [email or web address] URL: [email or web address] Date: 08/31/2023 Prepared by: Reyes Kohut  Exercises - Standard Plank  - 1 x daily - 5 x weekly - 1 sets - 2 reps - 30s hold - Supine 90/90 Abdominal Bracing  - 1 x daily - 5 x weekly - 1 sets - 2 reps - 60s hold - Curl Up with Arms Crossed  - 1 x daily - 5 x weekly - 2 sets - 10 reps  ASSESSMENT:  CLINICAL IMPRESSION: BERG score 48/56 indicating functional static balance.  Points lost due to LE weakness as opposed to LOB.  Advanced to proximal hip and core strengthening tasks.  Continued to provide education on pathology, spinal stenosis and effects on gait and strength.  Discussed need of f/u with neurosurgeon if symptoms persist or worsen following PT.   Patient is a 83 y.o. male who was seen today for physical therapy evaluation and treatment for BLE pain/weakness.  Patient's referral described balance issues but prior MD notes indicate diagnosis of spinal stenosis with neurogenic claudication.  Patient's symptoms include lower extremity weakness and global loss of sensation in bilateral lower extremities with a history of left  foot drop which patient corrects with a foot up  brace.  Patient presents with good range of motion throughout lower extremities, light touch diminished in both lower extremities with DTRs absent in both legs.  Patient is able to maintain 90/90 position for 30 seconds as well as perform 5 times sit to stand and 12 seconds which indicate good strength for patient's age group.  Patient demonstrates an ataxic gait with occasional need of furniture and wall assist to maintain stability.  He does not use an assistive device.  Recommend physical therapy once a week for 6-week.  With the focus on core strengthening to offset effects of spinal stenosis and neural claudication.  If symptoms persist recommend patient follow-up with spine surgeon to assess further  OBJECTIVE IMPAIRMENTS: Abnormal gait, decreased activity tolerance, decreased balance, decreased coordination, decreased endurance, decreased knowledge of condition, decreased mobility, difficulty walking, and decreased strength.   ACTIVITY LIMITATIONS: carrying, lifting, standing, and stairs  PERSONAL FACTORS: Age, Fitness, Time since onset of injury/illness/exacerbation, and 1 comorbidity: spinal stenosis are also affecting patient's functional outcome.   REHAB POTENTIAL: Good  CLINICAL DECISION MAKING: Stable/uncomplicated  EVALUATION COMPLEXITY: Low   GOALS: Goals reviewed with patient? No   LONG TERM GOALS: Target date: 10/25/23  Patient to demonstrate independence in HEP  Baseline: [email or web address] Goal status: INITIAL  2.  Increase standing tolerance to >5 min Baseline: 5 min Goal status: INITIAL  3.  Patient will score at least 19/30 on PSFS to signify clinically meaningful improvement in functional abilities.   Baseline: 13/30 Goal status: INITIAL  4.  Assess 2 MWT and establish goal Baseline: TBD Goal status: INITIAL  5.  Able to hold a standard plank for 60s Baseline: 30s Goal status: INITIAL     PLAN:  PT FREQUENCY: 1-2x/week  PT DURATION: 6 weeks  PLANNED  INTERVENTIONS: 97110-Therapeutic exercises, 97530- Therapeutic activity, 97112- Neuromuscular re-education, 97535- Self Care, [id number]- Manual therapy, [phone]- Gait training, Patient/Family education, Balance training, and Stair training  PLAN FOR NEXT SESSION: HEP review and update, manual techniques as appropriate, aerobic tasks, ROM and flexibility activities, strengthening and PREs, TPDN, gait and balance training as needed     Reyes CHRISTELLA Kohut, PT 08/31/2023, 4:59 PM

## 2023-08-31 NOTE — Progress Notes (Signed)
   08/31/23 0001  Berg Balance Test  Sit to Stand 4  Standing Unsupported 4  Sitting with Back Unsupported but Feet Supported on Floor or Stool 4  Stand to Sit 4  Transfers 4  Standing Unsupported with Eyes Closed 4  Standing Unsupported with Feet Together 4  From Standing, Reach Forward with Outstretched Arm 4  From Standing Position, Pick up Object from Floor 4  From Standing Position, Turn to Look Behind Over each Shoulder 4  Turn 360 Degrees 2  Standing Unsupported, Alternately Place Feet on Step/Stool 2  Standing Unsupported, One Foot in Front 3  Standing on One Leg 1  Total Score 48

## 2023-09-06 NOTE — Therapy (Signed)
 OUTPATIENT PHYSICAL THERAPY TREATMENT NOTE   Patient Name: Collin [name] MRN: [id number] DOB:1940-12-21, 83 y.o., male Today's Date: 09/08/2023  END OF SESSION:  PT End of Session - 09/08/23 1615     Visit Number 3    Number of Visits 6    Date for PT Re-Evaluation 10/25/23    Authorization Type HTA    PT Start Time 1615    PT Stop Time 1655    PT Time Calculation (min) 40 min    Activity Tolerance Patient tolerated treatment well;No increased pain    Behavior During Therapy WFL for tasks assessed/performed            Past Medical History:  Diagnosis Date   Allergy    Arthritis    BPH (benign prostatic hyperplasia)    CAD (coronary artery disease)    Cataract    Diverticulosis of colon    Hx of adenomatous colonic polyps 05/22/2014   Hyperlipidemia    Hypertension    MYOCARDIAL INFARCTION, HX OF 07/20/2006   Qualifier: Diagnosis of  By: Tammie MD, Bruce     Past Surgical History:  Procedure Laterality Date   CATARACT EXTRACTION Bilateral    Bil   COLONOSCOPY     CORONARY ARTERY BYPASS GRAFT N/A 09/01/2022   Procedure: CORONARY ARTERY BYPASS GRAFTING (CABG) times three using left internal mammary artery and right saphenous vein.;  Surgeon: Maryjane Mt, MD;  Location: [facility] OR;  Service: Open Heart Surgery;  Laterality: N/A;   CORONARY/GRAFT ACUTE MI REVASCULARIZATION N/A 09/01/2022   Procedure: Coronary/Graft Acute MI Revascularization;  Surgeon: Dann Candyce RAMAN, MD;  Location: [facility] INVASIVE CV LAB;  Service: Cardiovascular;  Laterality: N/A;   IABP INSERTION N/A 09/01/2022   Procedure: IABP Insertion;  Surgeon: Dann Candyce RAMAN, MD;  Location: [facility] INVASIVE CV LAB;  Service: Cardiovascular;  Laterality: N/A;   LEFT HEART CATH AND CORONARY ANGIOGRAPHY N/A 09/01/2022   Procedure: LEFT HEART CATH AND CORONARY ANGIOGRAPHY;  Surgeon: Dann Candyce RAMAN, MD;  Location: [facility] INVASIVE CV LAB;  Service: Cardiovascular;  Laterality: N/A;   LUMBAR LAMINECTOMY/DECOMPRESSION  MICRODISCECTOMY Left 01/20/2018   Procedure: Left Lumbar five Sacral one extraforaminal decompression;  Surgeon: Joshua Alm RAMAN, MD;  Location: [facility] OR;  Service: Neurosurgery;  Laterality: Left;   PTCA     TEE WITHOUT CARDIOVERSION N/A 09/01/2022   Procedure: TRANSESOPHAGEAL ECHOCARDIOGRAM;  Surgeon: Maryjane Mt, MD;  Location: [facility] OR;  Service: Open Heart Surgery;  Laterality: N/A;   Patient Active Problem List   Diagnosis Date Noted   Rhinitis, non-allergic 01/07/2023   Orthostatic hypotension 09/14/2022   Debility 09/09/2022   STEMI (ST elevation myocardial infarction) (HCC) 09/01/2022   S/P CABG x 3 09/01/2022   Right shoulder pain 01/05/2022   Routine general medical examination at a health care facility 01/05/2022   Prediabetes 01/05/2022   S/P lumbar laminectomy 01/20/2018   Essential tremor 11/02/2017   Unstable gait 11/02/2017   Lumbar radiculopathy 07/19/2017   Peripheral neuropathy 12/06/2016   Hx of adenomatous colonic polyps 05/22/2014   Left inguinal hernia 09/10/2011   PSA, INCREASED 08/05/2009   CONGENITAL PIGMENTARY ANOMALY OF SKIN 07/25/2007   Dyslipidemia 07/20/2006   Coronary atherosclerosis 07/20/2006   BPH associated with nocturia 07/20/2006    PCP: [place], Betty G, MD   REFERRING PROVIDER: Luke Chiquita SAUNDERS, DO  REFERRING DIAG: R26.89 (ICD-10-CM) - Balance problem  THERAPY DIAG:  Abnormality of gait and mobility  Unsteadiness on feet  Spinal stenosis of lumbar region with neurogenic  claudication  Rationale for Evaluation and Treatment: Rehabilitation  ONSET DATE: chronic  SUBJECTIVE:   SUBJECTIVE STATEMENT:  [place] from an out of [place] trip and reports fatigue in legs but no SOB.  Low back symptoms increase the longer he is on his feet but resolve when seated.   Patient arrives to PT with c/o balance disturbance which he feels is related to weakness.  Weakness began following MI 8/24, has been active at the gym.  Has been through cardiac rehab,  ending in 12/24.   Previous MD appointment in 3/25 shows a history of spinal stenosis.  Patient acknowledges decreased sensation in B legs and wears a L foot up brace to control foot drop.  Standing tolerance limited to 5 minutes but walking tolerance essentially unrestricted.   PERTINENT HISTORY: none PAIN:  Are you having pain? No  PRECAUTIONS: None  RED FLAGS: None   WEIGHT BEARING RESTRICTIONS: No  FALLS:  Has patient fallen in last 6 months? Yes. Number of falls 0  OCCUPATION: retired  PLOF: Independent  PATIENT GOALS: To improve my balance  NEXT MD VISIT: TBD  OBJECTIVE:  Note: Objective measures were completed at Evaluation unless otherwise noted.  DIAGNOSTIC FINDINGS: none  PATIENT SURVEYS:  Patient-specific activity scoring scheme (Point to one number):  0 represents "unable to perform." 10 represents "able to perform at prior level. 0 1 2 3 4 5 6 7 8 9  10 (Date and Score) Activity Initial  Activity Eval     Standing >5 min  5    Stair climbing  7     Arising from sitting w/o arms  1     Total score = sum of the activity scores/number of activities Minimum detectable change (90%CI) for average score = 2 points Minimum detectable change (90%CI) for single activity score = 3 points PSFS developed by: Rosalee MYRTIS Marvis KYM Charlet CHRISTELLA., & Binkley, J. (1995). Assessing disability and change on individual  patients: a report of a patient specific measure. Physiotherapy [place], 47, [phone]. Reproduced with the permission of the authors  Score: 13/30   MUSCLE LENGTH: deferred  POSTURE: flexed posture  PALPATION: deferred  LOWER EXTREMITY ROM: WNL  Active ROM Right eval Left eval  Hip flexion    Hip extension    Hip abduction    Hip adduction    Hip internal rotation    Hip external rotation    Knee flexion    Knee extension    Ankle dorsiflexion    Ankle plantarflexion    Ankle inversion    Ankle eversion     (Blank rows = not  tested)  LOWER EXTREMITY MMT: see 5x STS   MMT Right eval Left eval  Hip flexion    Hip extension    Hip abduction    Hip adduction    Hip internal rotation    Hip external rotation    Knee flexion    Knee extension    Ankle dorsiflexion    Ankle plantarflexion    Ankle inversion    Ankle eversion     (Blank rows = not tested)  LOWER EXTREMITY SPECIAL TESTS:  deferred  FUNCTIONAL TESTS:   5x STS 12 s arms crossed  09/08/23 2 MWT 388ft (536ft norm)  DTR's:  Unable to elicit BLE reflexes   GAIT: Distance walked: 39ft x2 Assistive device utilized: None Level of assistance: Complete Independence Comments: unsteady gait, furniture surfs, ataxic due to diminished sensation throughout LEs  TREATMENT:  [facility] Adult PT Treatment:                                                DATE: 09/08/23 Therapeutic Exercise: Supine hip fallouts BlaTB 15x B, 15/15 unilaterally S/L clams BlaTB  Bridge against BlaTB P-ball curl ups 15x B, 15/15 unilaterally Neuromuscular re-ed: Bridge with ball squeeze 15x FAQ with adduction 15x STS with 5000g ball Self Care: Continued discussion of spinal stenosis and effect on BLE symptoms, benefit of rocker bottom shoes to limit foot slap and potential need to f/u with neurosurgeon if symptoms persist.  [facility] Adult PT Treatment:                                                DATE: 08/31/23  Neuromuscular re-ed:   08/31/23 0001  Berg Balance Test  Sit to Stand 4  Standing Unsupported 4  Sitting with Back Unsupported but Feet Supported on Floor or Stool 4  Stand to Sit 4  Transfers 4  Standing Unsupported with Eyes Closed 4  Standing Unsupported with Feet Together 4  From Standing, Reach Forward with Outstretched Arm 4  From Standing Position, Pick up Object from Floor 4  From Standing Position, Turn to Look Behind Over  each Shoulder 4  Turn 360 Degrees 2  Standing Unsupported, Alternately Place Feet on Step/Stool 2  Standing Unsupported, One Foot in Front 3  Standing on One Leg 1  Total Score 48   Therapeutic Activity: Supine hip fallouts BluTB 15x B, 15/15 unilaterally S/L clams BluTB  Bridge against BluTB P-ball curl ups 15x B, 15/15 unilaterally Self Care: Dicussion of spinal stenosis and effect on BLE symptoms.   [facility] Adult PT Treatment:                                                DATE: 08/24/23  Self Care: Additional minutes spent for educating on updated Therapeutic Home Exercise Program as well as comparing current status to condition at start of symptoms. This included exercises focusing on stretching, strengthening, with focus on eccentric aspects. Long term goals include an improvement in range of motion, strength, endurance as well as avoiding reinjury. Patient's frequency would include in 1-2 times a day, 3-5 times a week for a duration of 6-12 weeks. Proper technique shown and discussed handout in great detail. All questions were discussed and addressed.      PATIENT EDUCATION:  Education details: Discussed eval findings, rehab rationale and POC and patient is in agreement  Person educated: Patient Education method: Explanation and Handouts Education comprehension: verbalized understanding and needs further education  HOME EXERCISE PROGRAM:    Access Code: [email or web address] URL: [email or web address] Date: 08/31/2023 Prepared by: Reyes Kohut  Exercises - Standard Plank  - 1 x daily - 5 x weekly - 1 sets - 2 reps - 30s hold - Supine 90/90 Abdominal Bracing  - 1 x daily - 5 x weekly - 1 sets - 2 reps - 60s hold - Curl Up with Arms Crossed  - 1 x daily - 5 x weekly - 2 sets -  10 reps  ASSESSMENT:  CLINICAL IMPRESSION: Continues to describe symptoms of spinal stenosis and associated symptom(see subjective).  Continued need of education regarding symptoms and limited  response to PT.  Patient reluctant to return to neurosurgeon for further assessment.   Patient is a 83 y.o. male who was seen today for physical therapy evaluation and treatment for BLE pain/weakness.  Patient's referral described balance issues but prior MD notes indicate diagnosis of spinal stenosis with neurogenic claudication.  Patient's symptoms include lower extremity weakness and global loss of sensation in bilateral lower extremities with a history of left foot drop which patient corrects with a foot up brace.  Patient presents with good range of motion throughout lower extremities, light touch diminished in both lower extremities with DTRs absent in both legs.  Patient is able to maintain 90/90 position for 30 seconds as well as perform 5 times sit to stand and 12 seconds which indicate good strength for patient's age group.  Patient demonstrates an ataxic gait with occasional need of furniture and wall assist to maintain stability.  He does not use an assistive device.  Recommend physical therapy once a week for 6-week.  With the focus on core strengthening to offset effects of spinal stenosis and neural claudication.  If symptoms persist recommend patient follow-up with spine surgeon to assess further  OBJECTIVE IMPAIRMENTS: Abnormal gait, decreased activity tolerance, decreased balance, decreased coordination, decreased endurance, decreased knowledge of condition, decreased mobility, difficulty walking, and decreased strength.   ACTIVITY LIMITATIONS: carrying, lifting, standing, and stairs  PERSONAL FACTORS: Age, Fitness, Time since onset of injury/illness/exacerbation, and 1 comorbidity: spinal stenosis are also affecting patient's functional outcome.   REHAB POTENTIAL: Good  CLINICAL DECISION MAKING: Stable/uncomplicated  EVALUATION COMPLEXITY: Low   GOALS: Goals reviewed with patient? No   LONG TERM GOALS: Target date: 10/25/23  Patient to demonstrate independence in HEP   Baseline: [email or web address] Goal status: Ongoing  2.  Increase standing tolerance to >5 min Baseline: 5 min Goal status: INITIAL  3.  Patient will score at least 19/30 on PSFS to signify clinically meaningful improvement in functional abilities.   Baseline: 13/30 Goal status: INITIAL  4.  Assess 2 MWT and establish goal Baseline: TBD Goal status: INITIAL  5.  Able to hold a standard plank for 60s Baseline: 30s Goal status: INITIAL     PLAN:  PT FREQUENCY: 1-2x/week  PT DURATION: 6 weeks  PLANNED INTERVENTIONS: 97110-Therapeutic exercises, 97530- Therapeutic activity, 97112- Neuromuscular re-education, 97535- Self Care, [id number]- Manual therapy, [phone]- Gait training, Patient/Family education, Balance training, and Stair training  PLAN FOR NEXT SESSION: HEP review and update, manual techniques as appropriate, aerobic tasks, ROM and flexibility activities, strengthening and PREs, TPDN, gait and balance training as needed     Reyes CHRISTELLA Kohut, PT 09/08/2023, 5:00 PM

## 2023-09-07 ENCOUNTER — Ambulatory Visit

## 2023-09-08 ENCOUNTER — Ambulatory Visit

## 2023-09-08 DIAGNOSIS — M48062 Spinal stenosis, lumbar region with neurogenic claudication: Secondary | ICD-10-CM

## 2023-09-08 DIAGNOSIS — R2681 Unsteadiness on feet: Secondary | ICD-10-CM

## 2023-09-08 DIAGNOSIS — R269 Unspecified abnormalities of gait and mobility: Secondary | ICD-10-CM | POA: Diagnosis not present

## 2023-09-12 NOTE — Therapy (Unsigned)
 OUTPATIENT PHYSICAL THERAPY TREATMENT NOTE/DISCHARGE SUMMARY   Patient Name: Collin [name] MRN: [id number] DOB:1940-09-27, 83 y.o., male Today's Date: 09/14/2023  END OF SESSION:  PT End of Session - 09/14/23 1133     Visit Number 4    Number of Visits 6    Date for PT Re-Evaluation 10/25/23    Authorization Type HTA    PT Start Time 1130    PT Stop Time 1210    PT Time Calculation (min) 40 min    Activity Tolerance Patient tolerated treatment well;No increased pain    Behavior During Therapy WFL for tasks assessed/performed             Past Medical History:  Diagnosis Date   Allergy    Arthritis    BPH (benign prostatic hyperplasia)    CAD (coronary artery disease)    Cataract    Diverticulosis of colon    Hx of adenomatous colonic polyps 05/22/2014   Hyperlipidemia    Hypertension    MYOCARDIAL INFARCTION, HX OF 07/20/2006   Qualifier: Diagnosis of  By: Tammie MD, Bruce     Past Surgical History:  Procedure Laterality Date   CATARACT EXTRACTION Bilateral    Bil   COLONOSCOPY     CORONARY ARTERY BYPASS GRAFT N/A 09/01/2022   Procedure: CORONARY ARTERY BYPASS GRAFTING (CABG) times three using left internal mammary artery and right saphenous vein.;  Surgeon: Maryjane Mt, MD;  Location: [facility] OR;  Service: Open Heart Surgery;  Laterality: N/A;   CORONARY/GRAFT ACUTE MI REVASCULARIZATION N/A 09/01/2022   Procedure: Coronary/Graft Acute MI Revascularization;  Surgeon: Dann Candyce RAMAN, MD;  Location: [facility] INVASIVE CV LAB;  Service: Cardiovascular;  Laterality: N/A;   IABP INSERTION N/A 09/01/2022   Procedure: IABP Insertion;  Surgeon: Dann Candyce RAMAN, MD;  Location: [facility] INVASIVE CV LAB;  Service: Cardiovascular;  Laterality: N/A;   LEFT HEART CATH AND CORONARY ANGIOGRAPHY N/A 09/01/2022   Procedure: LEFT HEART CATH AND CORONARY ANGIOGRAPHY;  Surgeon: Dann Candyce RAMAN, MD;  Location: [facility] INVASIVE CV LAB;  Service: Cardiovascular;  Laterality: N/A;   LUMBAR  LAMINECTOMY/DECOMPRESSION MICRODISCECTOMY Left 01/20/2018   Procedure: Left Lumbar five Sacral one extraforaminal decompression;  Surgeon: Joshua Alm RAMAN, MD;  Location: [facility] OR;  Service: Neurosurgery;  Laterality: Left;   PTCA     TEE WITHOUT CARDIOVERSION N/A 09/01/2022   Procedure: TRANSESOPHAGEAL ECHOCARDIOGRAM;  Surgeon: Maryjane Mt, MD;  Location: [facility] OR;  Service: Open Heart Surgery;  Laterality: N/A;   Patient Active Problem List   Diagnosis Date Noted   Rhinitis, non-allergic 01/07/2023   Orthostatic hypotension 09/14/2022   Debility 09/09/2022   STEMI (ST elevation myocardial infarction) (HCC) 09/01/2022   S/P CABG x 3 09/01/2022   Right shoulder pain 01/05/2022   Routine general medical examination at a health care facility 01/05/2022   Prediabetes 01/05/2022   S/P lumbar laminectomy 01/20/2018   Essential tremor 11/02/2017   Unstable gait 11/02/2017   Lumbar radiculopathy 07/19/2017   Peripheral neuropathy 12/06/2016   Hx of adenomatous colonic polyps 05/22/2014   Left inguinal hernia 09/10/2011   PSA, INCREASED 08/05/2009   CONGENITAL PIGMENTARY ANOMALY OF SKIN 07/25/2007   Dyslipidemia 07/20/2006   Coronary atherosclerosis 07/20/2006   BPH associated with nocturia 07/20/2006    PCP: [place], Betty G, MD   REFERRING PROVIDER: Luke Chiquita SAUNDERS, DO  REFERRING DIAG: R26.89 (ICD-10-CM) - Balance problem  THERAPY DIAG:  Abnormality of gait and mobility  Unsteadiness on feet  Spinal stenosis of lumbar region  with neurogenic claudication  Rationale for Evaluation and Treatment: Rehabilitation  ONSET DATE: chronic  SUBJECTIVE:   SUBJECTIVE STATEMENT: Reports continued strength deficits in BLEs.     Patient arrives to PT with c/o balance disturbance which he feels is related to weakness.  Weakness began following MI 8/24, has been active at the gym.  Has been through cardiac rehab, ending in 12/24.   Previous MD appointment in 3/25 shows a history of spinal stenosis.   Patient acknowledges decreased sensation in B legs and wears a L foot up brace to control foot drop.  Standing tolerance limited to 5 minutes but walking tolerance essentially unrestricted.   PERTINENT HISTORY: none PAIN:  Are you having pain? No  PRECAUTIONS: None  RED FLAGS: None   WEIGHT BEARING RESTRICTIONS: No  FALLS:  Has patient fallen in last 6 months? Yes. Number of falls 0  OCCUPATION: retired  PLOF: Independent  PATIENT GOALS: To improve my balance  NEXT MD VISIT: TBD  OBJECTIVE:  Note: Objective measures were completed at Evaluation unless otherwise noted.  DIAGNOSTIC FINDINGS: none  PATIENT SURVEYS:  Patient-specific activity scoring scheme (Point to one number):  0 represents "unable to perform." 10 represents "able to perform at prior level. 0 1 2 3 4 5 6 7 8 9  10 (Date and Score) Activity Initial  Activity Eval   09/14/23  Standing >5 min  5  5  Stair climbing  7   8  Arising from sitting w/o arms  1 6    Total score = sum of the activity scores/number of activities Minimum detectable change (90%CI) for average score = 2 points Minimum detectable change (90%CI) for single activity score = 3 points PSFS developed by: Rosalee MYRTIS Marvis KYM Charlet CHRISTELLA., & Binkley, J. (1995). Assessing disability and change on individual  patients: a report of a patient specific measure. Physiotherapy [place], 47, [phone]. Reproduced with the permission of the authors  Score: 13/30; 09/14/23 19/30   MUSCLE LENGTH: deferred  POSTURE: flexed posture  PALPATION: deferred  LOWER EXTREMITY ROM: WNL  Active ROM Right eval Left eval  Hip flexion    Hip extension    Hip abduction    Hip adduction    Hip internal rotation    Hip external rotation    Knee flexion    Knee extension    Ankle dorsiflexion    Ankle plantarflexion    Ankle inversion    Ankle eversion     (Blank rows = not tested)  LOWER EXTREMITY MMT: see 5x STS   MMT Right eval  Left eval  Hip flexion    Hip extension    Hip abduction    Hip adduction    Hip internal rotation    Hip external rotation    Knee flexion    Knee extension    Ankle dorsiflexion    Ankle plantarflexion    Ankle inversion    Ankle eversion     (Blank rows = not tested)  LOWER EXTREMITY SPECIAL TESTS:  deferred  FUNCTIONAL TESTS:   5x STS 12 s arms crossed; 09/14/23 9s arms crossed  09/08/23 2 MWT 323ft (526ft norm); 09/14/23 451ft  DTR's:  Unable to elicit BLE reflexes   GAIT: Distance walked: 10ft x2 Assistive device utilized: None Level of assistance: Complete Independence Comments: unsteady gait, furniture surfs, ataxic due to diminished sensation throughout LEs  TREATMENT:  [facility] Adult PT Treatment:                                                DATE: 09/14/23 Therapeutic Exercise: HEP review and update Therapeutic Activity: PSFS retake 2 MWT 469ft Self Care: Dicussion of spinal stenosis and effect on BLE symptoms and need to f/u with neurosurgeon if progress halts.  [facility] Adult PT Treatment:                                                DATE: 09/08/23 Therapeutic Exercise: Supine hip fallouts BlaTB 15x B, 15/15 unilaterally S/L clams BlaTB  Bridge against BlaTB P-ball curl ups 15x B, 15/15 unilaterally Neuromuscular re-ed: Bridge with ball squeeze 15x FAQ with adduction 15x STS with 5000g ball Self Care: Continued discussion of spinal stenosis and effect on BLE symptoms, benefit of rocker bottom shoes to limit foot slap and potential need to f/u with neurosurgeon if symptoms persist.  [facility] Adult PT Treatment:                                                DATE: 08/31/23  Neuromuscular re-ed:   08/31/23 0001  Berg Balance Test  Sit to Stand 4  Standing Unsupported 4  Sitting with Back Unsupported but Feet Supported on Floor or  Stool 4  Stand to Sit 4  Transfers 4  Standing Unsupported with Eyes Closed 4  Standing Unsupported with Feet Together 4  From Standing, Reach Forward with Outstretched Arm 4  From Standing Position, Pick up Object from Floor 4  From Standing Position, Turn to Look Behind Over each Shoulder 4  Turn 360 Degrees 2  Standing Unsupported, Alternately Place Feet on Step/Stool 2  Standing Unsupported, One Foot in Front 3  Standing on One Leg 1  Total Score 48   Therapeutic Activity: Supine hip fallouts BluTB 15x B, 15/15 unilaterally S/L clams BluTB  Bridge against BluTB P-ball curl ups 15x B, 15/15 unilaterally Self Care: Dicussion of spinal stenosis and effect on BLE symptoms.   [facility] Adult PT Treatment:                                                DATE: 08/24/23  Self Care: Additional minutes spent for educating on updated Therapeutic Home Exercise Program as well as comparing current status to condition at start of symptoms. This included exercises focusing on stretching, strengthening, with focus on eccentric aspects. Long term goals include an improvement in range of motion, strength, endurance as well as avoiding reinjury. Patient's frequency would include in 1-2 times a day, 3-5 times a week for a duration of 6-12 weeks. Proper technique shown and discussed handout in great detail. All questions were discussed and addressed.      PATIENT EDUCATION:  Education details: Discussed eval findings, rehab rationale and POC and patient is in agreement  Person educated: Patient Education method: Explanation and Handouts Education comprehension: verbalized  understanding and needs further education  HOME EXERCISE PROGRAM:    Access Code: [email or web address] URL: [email or web address] Date: 08/31/2023 Prepared by: Collin [name]  Exercises - Standard Plank  - 1 x daily - 5 x weekly - 1 sets - 2 reps - 30s hold - Supine 90/90 Abdominal Bracing  - 1 x daily - 5 x weekly - 1 sets - 2  reps - 60s hold - Curl Up with Arms Crossed  - 1 x daily - 5 x weekly - 2 sets - 10 reps  ASSESSMENT:  CLINICAL IMPRESSION: Rehab goals met and maximum function regained.  Patient ready for independent management.  Patient is a 82 y.o. male who was seen today for physical therapy evaluation and treatment for BLE pain/weakness.  Patient's referral described balance issues but prior MD notes indicate diagnosis of spinal stenosis with neurogenic claudication.  Patient's symptoms include lower extremity weakness and global loss of sensation in bilateral lower extremities with a history of left foot drop which patient corrects with a foot up brace.  Patient presents with good range of motion throughout lower extremities, light touch diminished in both lower extremities with DTRs absent in both legs.  Patient is able to maintain 90/90 position for 30 seconds as well as perform 5 times sit to stand and 12 seconds which indicate good strength for patient's age group.  Patient demonstrates an ataxic gait with occasional need of furniture and wall assist to maintain stability.  He does not use an assistive device.  Recommend physical therapy once a week for 6-week.  With the focus on core strengthening to offset effects of spinal stenosis and neural claudication.  If symptoms persist recommend patient follow-up with spine surgeon to assess further  OBJECTIVE IMPAIRMENTS: Abnormal gait, decreased activity tolerance, decreased balance, decreased coordination, decreased endurance, decreased knowledge of condition, decreased mobility, difficulty walking, and decreased strength.   ACTIVITY LIMITATIONS: carrying, lifting, standing, and stairs  PERSONAL FACTORS: Age, Fitness, Time since onset of injury/illness/exacerbation, and 1 comorbidity: spinal stenosis are also affecting patient's functional outcome.   REHAB POTENTIAL: Good  CLINICAL DECISION MAKING: Stable/uncomplicated  EVALUATION COMPLEXITY:  Low   GOALS: Goals reviewed with patient? No   LONG TERM GOALS: Target date: 10/25/23  Patient to demonstrate independence in HEP  Baseline: [email or web address] Goal status: Met  2.  Increase standing tolerance to >5 min Baseline: 5 min; 09/14/23 >5 min Goal status: Met  3.  Patient will score at least 19/30 on PSFS to signify clinically meaningful improvement in functional abilities.   Baseline: 13/30; 09/14/23 19/30 Goal status: Met  4.  Assess 2 MWT and establish goal Baseline: 466ft ft Goal status: Met  5.  Able to hold a standard plank for 60s Baseline: 30s; 09/14/23 60s Goal status: Met     PLAN:  PT FREQUENCY: 1-2x/week  PT DURATION: 6 weeks  PLANNED INTERVENTIONS: 97110-Therapeutic exercises, 97530- Therapeutic activity, 97112- Neuromuscular re-education, 97535- Self Care, [id number]- Manual therapy, [phone]- Gait training, Patient/Family education, Balance training, and Stair training  PLAN FOR NEXT SESSION: HEP review and update, manual techniques as appropriate, aerobic tasks, ROM and flexibility activities, strengthening and PREs, TPDN, gait and balance training as needed     Collin [name], PT 09/14/2023, 12:11 PM

## 2023-09-14 ENCOUNTER — Ambulatory Visit

## 2023-09-14 DIAGNOSIS — R269 Unspecified abnormalities of gait and mobility: Secondary | ICD-10-CM

## 2023-09-14 DIAGNOSIS — R2681 Unsteadiness on feet: Secondary | ICD-10-CM

## 2023-09-14 DIAGNOSIS — M48062 Spinal stenosis, lumbar region with neurogenic claudication: Secondary | ICD-10-CM

## 2023-09-21 ENCOUNTER — Ambulatory Visit

## 2023-10-20 DIAGNOSIS — L578 Other skin changes due to chronic exposure to nonionizing radiation: Secondary | ICD-10-CM | POA: Diagnosis not present

## 2023-10-20 DIAGNOSIS — L821 Other seborrheic keratosis: Secondary | ICD-10-CM | POA: Diagnosis not present

## 2023-10-20 DIAGNOSIS — L814 Other melanin hyperpigmentation: Secondary | ICD-10-CM | POA: Diagnosis not present

## 2023-10-20 DIAGNOSIS — L570 Actinic keratosis: Secondary | ICD-10-CM | POA: Diagnosis not present

## 2023-10-20 DIAGNOSIS — D225 Melanocytic nevi of trunk: Secondary | ICD-10-CM | POA: Diagnosis not present

## 2023-10-20 DIAGNOSIS — Z85828 Personal history of other malignant neoplasm of skin: Secondary | ICD-10-CM | POA: Diagnosis not present

## 2023-10-25 NOTE — Progress Notes (Unsigned)
 Cardiology Office Note:    Date:  10/25/2023   ID:  Collin [name], DOB Jun 08, 1940, MRN [id number]  PCP:  [place], Betty G, MD    [facility] Providers Cardiologist:  Ozell Fell, MD     Referring MD: [place], Betty G, MD   No chief complaint on file.   History of Present Illness:    Collin [name] is a 83 y.o. male with a hx of ***   Current Medications: No outpatient medications have been marked as taking for the 10/26/23 encounter (Appointment) with Fell Ozell, MD.     Allergies:   Penicillins   ROS:   Please see the history of present illness.    All other systems reviewed and are negative.  EKGs/Labs/Other Studies Reviewed:    The following studies were reviewed today: Cardiac Studies & Procedures   ______________________________________________________________________________________________ CARDIAC CATHETERIZATION  CARDIAC CATHETERIZATION 09/01/2022  Conclusion   Mid LM to Dist LM lesion is 90% stenosed.   Ost Cx to Prox Cx lesion is 99% stenosed.   1st Diag lesion is 50% stenosed.   2nd Diag lesion is 90% stenosed.   Mid LAD lesion is 80% stenosed.   The left ventricular systolic function is normal.   LV end diastolic pressure is mildly elevated.   The left ventricular ejection fraction is 50-55% by visual estimate.   There is no aortic valve stenosis.   Recommend Aspirin  post CABG.  Add Plavix  when safe from a bleeding standpoint.  Acute inferior MI due to severe ostial circumflex and distal left main stenosis.  Severe mid LAD disease and ostial second diagonal disease.  Patent RCA.  Given the anatomy, will plan for emergent bypass surgery.  IABP was placed in the right femoral artery.  The patient was also started on IV nitroglycerin .  Dr. Maryjane has evaluated the patient and will be taking the patient to the OR shortly.  Findings Coronary Findings Diagnostic  Dominance: Right  Left Main Mid LM to Dist LM lesion is 90%  stenosed.  Left Anterior Descending Mid LAD lesion is 80% stenosed.  First Diagonal Branch 1st Diag lesion is 50% stenosed.  Second Diagonal Branch 2nd Diag lesion is 90% stenosed.  Left Circumflex Ost Cx to Prox Cx lesion is 99% stenosed.  Right Coronary Artery Vessel is large.  Intervention  No interventions have been documented.   STRESS TESTS  MYOCARDIAL PERFUSION IMAGING 11/29/1996     TEE  ECHO INTRAOPERATIVE TEE 09/01/2022  Narrative *INTRAOPERATIVE TRANSESOPHAGEAL REPORT *    Patient Name:   Collin [name] Date of Exam: 09/01/2022 Medical Rec #:  [id number]        Height:       73.0 in Accession #:    [id number]       Weight:       196.2 lb Date of Birth:  [birth year]        BSA:          2.13 m Patient Age:    82 years         BP:           119/43 mmHg Patient Gender: M                HR:           66 bpm. Exam Location:  Anesthesiology  Transesophogeal exam was perform intraoperatively during surgical procedure. Patient was closely monitored under general anesthesia during the entirety of examination.  Indications:  I20.0 Unstable angina Performing Phys: [id number] DEWARD KALLMAN  Complications: No known complications during this procedure. POST-OP IMPRESSIONS _ Left Ventricle: has normal systolic function, with an ejection fraction of 65%. The cavity size was normal. The wall motion is normal. _ Right Ventricle: The right ventricle appears unchanged from pre-bypass normal function. _ Aorta: The aorta appears unchanged from pre-bypassIntra-aortic balloon pump present in descending aorta. _ Left Atrial Appendage: The left atrial appendage appears unchanged from pre-bypass. _ Mitral Valve: There is mild regurgitation. _ Tricuspid Valve: There is trivial regurgitation. _ Pulmonic Valve: The pulmonic valve appears unchanged from pre-bypass. _ Interatrial Septum: The interatrial septum appears unchanged from pre-bypass. _ Pericardium: The pericardium  appears unchanged from pre-bypass. _ Comments: Post-bypass images reviewed with surgeon.  PRE-OP FINDINGS Left Ventricle: The left ventricle has normal systolic function, with an ejection fraction of 60-65%. The cavity size was normal. There is no left ventricular hypertrophy.   Right Ventricle: The right ventricle has normal systolic function. The cavity was normal. There is no increase in right ventricular wall thickness.  Left Atrium: Left atrial size was normal in size. No left atrial/left atrial appendage thrombus was detected.  Right Atrium: Right atrial size was normal in size.  Interatrial Septum: No atrial level shunt detected by color flow Doppler.  Pericardium: There is no evidence of pericardial effusion.  Mitral Valve: The mitral valve is normal in structure. Mitral valve regurgitation is trivial by color flow Doppler.  Tricuspid Valve: The tricuspid valve was normal in structure. Tricuspid valve regurgitation was not visualized by color flow Doppler.  Aortic Valve: The aortic valve is tricuspid Aortic valve regurgitation was not visualized by color flow Doppler.   Pulmonic Valve: The pulmonic valve was normal in structure. Pulmonic valve regurgitation is trivial by color flow Doppler.   Aorta: The aortic root, ascending aorta and aortic arch are normal in size and structure. Intra-aortic balloon pump present in descending aorta.  Pulmonary Artery: The pulmonary artery is of normal size.  +--------------+-------++ LEFT VENTRICLE        +--------------+-------++ PLAX 2D               +--------------+-------++ LVIDd:        4.20 cm +--------------+-------++ LVIDs:        2.70 cm +--------------+-------++ LV PW:        0.70 cm +--------------+-------++ LV SV:        52 ml   +--------------+-------++ LV SV Index:  23.98   +--------------+-------++                       +--------------+-------++   Garnette Skillern  MD Electronically signed by Garnette Skillern MD Signature Date/Time: 09/03/2022/12:33:38 PM    Final        ______________________________________________________________________________________________      EKG:        Recent Labs: 01/07/2023: ALT 28; BUN 16; Creatinine, Ser 1.23; Hemoglobin 14.8; Platelets 208.0; Potassium 4.2; Sodium 140  Recent Lipid Panel    Component Value Date/Time   CHOL 117 01/07/2023 0917   TRIG 62.0 01/07/2023 0917   HDL 61.20 01/07/2023 0917   CHOLHDL 2 01/07/2023 0917   VLDL 12.4 01/07/2023 0917   LDLCALC 43 01/07/2023 0917   LDLCALC 54 11/05/2019 0828     Risk Assessment/Calculations:      No BP recorded.  {Refresh Note OR Click here to enter BP  :1}***         Physical Exam:    VS:  There were no vitals taken for this visit.    Wt Readings from Last 3 Encounters:  04/05/23 194 lb (88 kg)  02/07/23 194 lb 6.4 oz (88.2 kg)  01/12/23 195 lb 8.8 oz (88.7 kg)     GEN:  Well nourished, well developed in no acute distress HEENT: Normal NECK: No JVD; No carotid bruits LYMPHATICS: No lymphadenopathy CARDIAC: RRR, no murmurs, rubs, gallops RESPIRATORY:  Clear to auscultation without rales, wheezing or rhonchi  ABDOMEN: Soft, non-tender, non-distended MUSCULOSKELETAL:  No edema; No deformity  SKIN: Warm and dry NEUROLOGIC:  Alert and oriented x 3 PSYCHIATRIC:  Normal affect   Assessment & Plan Coronary artery disease involving native coronary artery of native heart without angina pectoris  Mixed hyperlipidemia  Essential hypertension        {Are you ordering a CV Procedure (e.g. stress test, cath, DCCV, TEE, etc)?   Press F2        :[id number]}    Medication Adjustments/Labs and Tests Ordered: Current medicines are reviewed at length with the patient today.  Concerns regarding medicines are outlined above.  No orders of the defined types were placed in this encounter.  No orders of the defined types were placed in this  encounter.   There are no Patient Instructions on file for this visit.   Signed, Ozell Fell, MD  10/25/2023 4:24 PM    [facility]

## 2023-10-26 ENCOUNTER — Other Ambulatory Visit: Payer: Self-pay | Admitting: Cardiovascular Disease

## 2023-10-26 ENCOUNTER — Encounter: Payer: Self-pay | Admitting: Cardiovascular Disease

## 2023-10-26 ENCOUNTER — Ambulatory Visit: Attending: Cardiovascular Disease | Admitting: Cardiovascular Disease

## 2023-10-26 VITALS — BP 120/70 | HR 74 | Ht 73.0 in | Wt 190.0 lb

## 2023-10-26 DIAGNOSIS — E782 Mixed hyperlipidemia: Secondary | ICD-10-CM | POA: Diagnosis not present

## 2023-10-26 DIAGNOSIS — I2510 Atherosclerotic heart disease of native coronary artery without angina pectoris: Secondary | ICD-10-CM

## 2023-10-26 DIAGNOSIS — I10 Essential (primary) hypertension: Secondary | ICD-10-CM | POA: Diagnosis not present

## 2023-10-26 MED ORDER — METOPROLOL SUCCINATE ER 25 MG PO TB24: 25.0000 mg | ORAL_TABLET | Freq: Every day | ORAL | 3 refills | Status: AC

## 2023-10-26 NOTE — Patient Instructions (Signed)
 Medication Instructions:  INCREASE Metoprolol  Succinate (Toprol -XL) to 25 mg once daily   *If you need a refill on your cardiac medications before your next appointment, please call your pharmacy*  Lab Work: None ordered today. If you have labs (blood work) drawn today and your tests are completely normal, you will receive your results only by: MyChart Message (if you have MyChart) OR A paper copy in the mail If you have any lab test that is abnormal or we need to change your treatment, we will call you to review the results.  Testing/Procedures: None ordered today.  Follow-Up: At [facility], you and your health needs are our priority.  As part of our continuing mission to provide you with exceptional heart care, our providers are all part of one team.  This team includes your primary Cardiologist (physician) and Advanced Practice Providers or APPs (Physician Assistants and Nurse Practitioners) who all work together to provide you with the care you need, when you need it.  Your next appointment:   1 year(s)  Provider:   Ozell Fell, MD

## 2023-11-06 NOTE — Progress Notes (Signed)
 Assessment/Plan:     Collin [name] is a very pleasant 83 y.o. year old RH male with a history of hypertension, hyperlipidemia, CAD status post CABG, BPH, prediabetes seen today for evaluation of memory loss.  Patient has been seen initially in 2019, at which time this was felt to be due to normal aging process, then readdressed 1 year ago at which time neurocognitive testing was offered to him, but he declined since it would take a long wait to have it performed. In today's visit, he reports worsening memory, MoCA today is  23/30. Etiology unclear, will proceed with proper workup.  He was repetitive during this visit, especially with questions. Patient is able to participate on ADLs and to drive without difficulties. Mood is anxious, recommend to address it with PCP.     Memory impairment of unclear etiology   MRI brain without contrast to assess for underlying structural abnormality and assess vascular load  Check B12, TSH Neuropsych testing for clarity of diagnosis and disease trajectory   Will entertain starting Aricept 10 mg daily after imaging.  Recommend good control of cardiovascular risk factors.  Continue Plavix  Continue to control mood as per PCP Continue PT OT for strength and balance Fol[place]ow up in 3 months   Subjective:    The patient is here alone    How long did patient have memory difficulties? I think I was diagnosed with memory impairment 6-7 years ago, but did not need any medications-he says.  Patient has difficulty remembering new information, recent conversations and names of people , as well as with LTM.   repeats oneself? Denies. (Repetition was observed during this visit however).  Disoriented when walking into a room?  Denies.   Leaving objects in unusual places?   Denies.  Wandering behavior? Denies.   Any personality changes, or depression, anxiety?  Has moments of irritability   Hallucinations or paranoia?  Denies.   Seizures? Denies.    Any sleep  changes?  Sleeps well. Reports vivid dreams but not nightmares, REM behavior or sleepwalking.   Sleep apnea? Denies.   Any hygiene concerns?  Denies.   Independent of bathing and dressing?  Does not need assistance getting dressed.  Who is in charge of the medications? Patient is in charge   Who is in charge of the finances?  Patient is in charge. Doing checkbook takes me longer than before   Any changes in appetite?   Denies.     Patient have trouble swallowing?  Denies.   Does the patient cook?  No   Any headaches?  Denies.   Any vision changes?  Denies.  Patient has a history of cataracts removal bilaterally Chronic pain, back problems, for a long time, I guess it is due to arthritis. Follows Ortho Ambulates with difficulty?  He has chronic  L foot drop, not very compliant with the foot appliance  I use it when I wear new shoes.  Works out at [employer].   Recent falls or head injuries? Denies.     Vision changes? Denies. Stroke like symptoms?  Denies.   Any tremors?  She was seen in the past by Dr. Evonnie, having had a very thorough workup, not meeting the criteria for diagnosis of Parkinson's disease.  At time, he had very mild resting tremors without bradykinesia.  Tremors may be more noticeable if he is holding small objects. He does not report any worsening of the symptoms Any anosmia?  Denies.   Any  incontinence of urine? Denies.   Any bowel dysfunction? Denies.      Patient lives with his wife  History of heavy alcohol intake? Denies.   History of heavy tobacco use? Denies.   Family history of dementia?   ? Mother, she was 33  Does patient drive? Yes, denies any issues.  He drives short distances, familiar places, only during the day. 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  Allergies  Allergen Reactions   Penicillins Rash    Immediate rash    Current Outpatient Medications  Medication Instructions   ascorbic acid  (VITAMIN C ) 250 mg, Oral,  Daily   aspirin  81 mg, Oral, Daily at bedtime   atorvastatin  (LIPITOR ) 80 mg, Oral, Daily   clopidogrel  (PLAVIX ) 75 MG tablet TAKE 1 TABLET(75 MG) BY MOUTH DAILY   Coenzyme Q10 (COQ10 PO) 300 mg, Daily   Glucosamine-Chondroitin 500-400 MG CAPS 3 tablets, Daily   ipratropium (ATROVENT ) 0.06 % nasal spray 2 sprays, Each Nare, 2 times daily PRN   metoprolol  succinate (TOPROL -XL) 25 mg, Oral, Daily   Multiple Vitamins-Minerals (MENS 50+ MULTIVITAMIN) TABS 1 tablet, Daily   Omega-3 Fatty Acids (FISH OIL PO) 1,400 mg, Daily   Probiotic Product (PROBIOTIC PO) 1 tablet, Daily   tamsulosin  (FLOMAX ) 0.4 MG CAPS capsule TAKE 1 CAPSULE(0.4 MG) BY MOUTH DAILY   Vitamin D-3 125 mcg, Daily     VITALS:   Vitals:   11/09/23 0940  BP: 135/67  Pulse: 66  Resp: 20  SpO2: 96%  Weight: 195 lb (88.5 kg)  Height: 6' 1 (1.854 m)          11/09/2023   12:00 PM 01/26/2017   11:25 AM  Montreal Cognitive Assessment   Visuospatial/ Executive (0/5) 3 4  Naming (0/3) 3 2  Attention: Read list of digits (0/2) 2 2  Attention: Read list of letters (0/1) 1 1  Attention: Serial 7 subtraction starting at 100 (0/3) 3 3  Language: Repeat phrase (0/2) 1 2  Language : Fluency (0/1) 1 1  Abstraction (0/2) 2 2  Delayed Recall (0/5) 1 3  Orientation (0/6) 6 5  Total 23 25  Adjusted Score (based on education) 23 25        No data to display           PHYSICAL EXAM   HEENT:  Normocephalic, atraumatic. The mucous membranes are moist. The superficial temporal arteries are without ropiness or tenderness. Cardiovascular: Regular rate and rhythm. Lungs: Clear to auscultation bilaterally. Neck: There are no carotid bruits noted bilaterally. Orientation:  Alert and oriented to person, to place and time . No aphasia or dysarthria. Fund of knowledge is appropriate. Recent and remote memory impaired.  Attention and concentration are reduced.  Able to name objects and unable to repeat phrases. Delayed recall     Cranial nerves: There is good facial symmetry. Anxious appearing. Extraocular muscles are intact and visual fields are full to confrontational testing. Speech is fluent and clear, no tongue deviation. Hearing is intact to conversational tone.  Tone: Tone is good throughout. Sensation: Sensation is intact to light touch and pinprick throughout. Vibration is intact at the bilateral big toe. Coordination: The patient has no difficulty with RAM's or FNF bilaterally. Normal finger to nose  Motor: Strength is 5/5 in the bilateral upper and lower extremities.  Except for the left flexion and strength, 4/5.  There is no pronator drift. There are no fasciculations noted. DTR's: Deep tendon reflexes are  1/4 .  Plantar responses are downgoing bilaterally  Gait and Station: The patient is able to ambulate without.  Mild left foot drop without the braces, normal with the braces. Gait is cautious and mildly wide based.  Good arm swing     Thank you for allowing us  the opportunity to participate in the care of this nice patient. Please do not hesitate to contact us  for any questions or concerns.   Total time spent on today's visit was 63 minutes dedicated to this patient today, preparing to see patient, examining the patient, ordering tests and/or medications and counseling the patient, documenting clinical information in the EHR or other health record, independently interpreting results and communicating results to the patient/family, discussing treatment and goals, answering patient's questions and coordinating care.  Cc:  [place], Betty G, MD  Camie Sevin 11/09/2023 12:20 PM

## 2023-11-07 ENCOUNTER — Telehealth: Payer: Self-pay | Admitting: Cardiovascular Disease

## 2023-11-07 MED ORDER — ATORVASTATIN CALCIUM 80 MG PO TABS: 80.0000 mg | ORAL_TABLET | Freq: Every day | ORAL | 3 refills | Status: AC

## 2023-11-07 NOTE — Telephone Encounter (Signed)
 RX sent in

## 2023-11-07 NOTE — Telephone Encounter (Signed)
*  STAT* If patient is at the pharmacy, call can be transferred to refill team.   1. Which medications need to be refilled? (please list name of each medication and dose if known) atorvastatin  (LIPITOR ) 80 MG tablet    2. Would you like to learn more about the convenience, safety, & potential cost savings by using the [facility]? No    3. Are you open to using the [facility] (Type [facility]. No    4. Which pharmacy/location (including street and city if local pharmacy) is medication to be sent to? [id number] - [place], [place] - [address] AT [facility] [address]     5. Do they need a 30 day or 90 day supply? 90 day   Pt is out of medication

## 2023-11-09 ENCOUNTER — Other Ambulatory Visit

## 2023-11-09 ENCOUNTER — Encounter: Payer: Self-pay | Admitting: Physician Assistant

## 2023-11-09 ENCOUNTER — Ambulatory Visit (INDEPENDENT_AMBULATORY_CARE_PROVIDER_SITE_OTHER): Payer: Self-pay | Admitting: Physician Assistant

## 2023-11-09 ENCOUNTER — Encounter

## 2023-11-09 VITALS — BP 135/67 | HR 66 | Resp 20 | Ht 73.0 in | Wt 195.0 lb

## 2023-11-09 DIAGNOSIS — R413 Other amnesia: Secondary | ICD-10-CM | POA: Diagnosis not present

## 2023-11-09 LAB — VITAMIN B12: Vitamin B-12: 486 pg/mL (ref 200–1100)

## 2023-11-09 LAB — TSH: TSH: 1.63 m[IU]/L (ref 0.40–4.50)

## 2023-11-09 NOTE — Patient Instructions (Addendum)
 It was a pleasure to see you today at our office.   Recommendations:  Neurocognitive evaluation at our office   MRI of the brain, the radiology office will call you to arrange you appointment  [phone] Check labs today  [address] Continue hearing aids Follow up in 3 months    [email or web address]   RECOMMENDATIONS FOR ALL PATIENTS WITH MEMORY PROBLEMS: 1. Continue to exercise (Recommend 30 minutes of walking everyday, or 3 hours every week) 2. Increase social interactions - continue going to [employer] and enjoy social gatherings with friends and family 3. Eat healthy, avoid fried foods and eat more fruits and vegetables 4. Maintain adequate blood pressure, blood sugar, and blood cholesterol level. Reducing the risk of stroke and cardiovascular disease also helps promoting better memory. 5. Avoid stressful situations. Live a simple life and avoid aggravations. Organize your time and prepare for the next day in anticipation. 6. Sleep well, avoid any interruptions of sleep and avoid any distractions in the bedroom that may interfere with adequate sleep quality 7. Avoid sugar, avoid sweets as there is a strong link between excessive sugar intake, diabetes, and cognitive impairment We discussed the Mediterranean diet, which has been shown to help patients reduce the risk of progressive memory disorders and reduces cardiovascular risk. This includes eating fish, eat fruits and green leafy vegetables, nuts like almonds and hazelnuts, walnuts, and also use olive oil. Avoid fast foods and fried foods as much as possible. Avoid sweets and sugar as sugar use has been linked to worsening of memory function.  There is always a concern of gradual progression of memory problems. If this is the case, then we may need to adjust level of care according to patient needs. Support, both to the patient and caregiver, should then be put into place.       You have been referred for a neuropsychological evaluation (i.e., evaluation of memory and thinking abilities). Please bring someone with you to this appointment if possible, as it is helpful for the doctor to hear from both you and another adult who knows you well. Please bring eyeglasses and hearing aids if you wear them.    The evaluation will take approximately 3 hours and has two parts:   The first part is a clinical interview with the neuropsychologist (Dr. Richie or Dr. Gayland). During the interview, the neuropsychologist will speak with you and the individual you brought to the appointment.    The second part of the evaluation is testing with the doctor's technician Neal or Luke). During the testing, the technician will ask you to remember different types of material, solve problems, and answer some questionnaires. Your family member will not be present for this portion of the evaluation.   Please note: We must reserve several hours of the neuropsychologist's time and the psychometrician's time for your evaluation appointment. As such, there is a No-Show fee of $100. If you are unable to attend any of your appointments, please contact our office as soon as possible to reschedule.      DRIVING: Regarding driving, in patients with progressive memory problems, driving will be impaired. We advise to have someone else do the driving if trouble finding directions or if minor accidents are reported. Independent driving assessment is available to determine safety of driving.   If you are interested in the driving assessment, you can contact the following:  [facility] in [place] [phone]  [facility] [phone]  [facility] [phone]  [facility]  [phone] or [phone]   FALL PRECAUTIONS: Be cautious when walking. Scan the area for obstacles that may increase the risk of trips and falls. When getting up in the  mornings, sit up at the edge of the bed for a few minutes before getting out of bed. Consider elevating the bed at the head end to avoid drop of blood pressure when getting up. Walk always in a well-lit room (use night lights in the walls). Avoid area rugs or power cords from appliances in the middle of the walkways. Use a walker or a cane if necessary and consider physical therapy for balance exercise. Get your eyesight checked regularly.  FINANCIAL OVERSIGHT: Supervision, especially oversight when making financial decisions or transactions is also recommended.  HOME SAFETY: Consider the safety of the kitchen when operating appliances like stoves, microwave oven, and blender. Consider having supervision and share cooking responsibilities until no longer able to participate in those. Accidents with firearms and other hazards in the house should be identified and addressed as well.   ABILITY TO BE LEFT ALONE: If patient is unable to contact 911 operator, consider using LifeLine, or when the need is there, arrange for someone to stay with patients. Smoking is a fire hazard, consider supervision or cessation. Risk of wandering should be assessed by caregiver and if detected at any point, supervision and safe proof recommendations should be instituted.  MEDICATION SUPERVISION: Inability to self-administer medication needs to be constantly addressed. Implement a mechanism to ensure safe administration of the medications.      Mediterranean Diet A Mediterranean diet refers to food and lifestyle choices that are based on the traditions of countries located on the [place]. This way of eating has been shown to help prevent certain conditions and improve outcomes for people who have chronic diseases, like kidney disease and heart disease. What are tips for following this plan? Lifestyle  Cook and eat meals together with your family, when possible. Drink enough fluid to keep your urine clear or  pale yellow. Be physically active every day. This includes: Aerobic exercise like running or swimming. Leisure activities like gardening, walking, or housework. Get 7-8 hours of sleep each night. If recommended by your health care provider, drink red wine in moderation. This means 1 glass a day for nonpregnant women and 2 glasses a day for men. A glass of wine equals 5 oz (150 mL). Reading food labels  Check the serving size of packaged foods. For foods such as rice and pasta, the serving size refers to the amount of cooked product, not dry. Check the total fat in packaged foods. Avoid foods that have saturated fat or trans fats.  Check the ingredients list for added sugars, such as corn syrup. Shopping  At the grocery store, buy most of your food from the areas near the walls of the store. This includes: Fresh fruits and vegetables (produce). Grains, beans, nuts, and seeds. Some of these may be available in unpackaged forms or large amounts (in bulk). Fresh seafood. Poultry and eggs. Low-fat dairy products. Buy whole ingredients instead of prepackaged foods. Buy fresh fruits and vegetables in-season from local farmers markets. Buy frozen fruits and vegetables in resealable bags. If you do not have access to quality fresh seafood, buy precooked frozen shrimp or canned fish, such as tuna, salmon, or sardines. Buy small amounts of raw or cooked vegetables, salads, or olives from the deli or salad bar at your store. Stock your pantry so you always have certain foods  on hand, such as olive oil, canned tuna, canned tomatoes, rice, pasta, and beans. Cooking  Cook foods with extra-virgin olive oil instead of using butter or other vegetable oils. Have meat as a side dish, and have vegetables or grains as your main dish. This means having meat in small portions or adding small amounts of meat to foods like pasta or stew. Use beans or vegetables instead of meat in common dishes like chili or  lasagna. Experiment with different cooking methods. Try roasting or broiling vegetables instead of steaming or sauteing them. Add frozen vegetables to soups, stews, pasta, or rice. Add nuts or seeds for added healthy fat at each meal. You can add these to yogurt, salads, or vegetable dishes. Marinate fish or vegetables using olive oil, lemon juice, garlic, and fresh herbs. Meal planning  Plan to eat 1 vegetarian meal one day each week. Try to work up to 2 vegetarian meals, if possible. Eat seafood 2 or more times a week. Have healthy snacks readily available, such as: Vegetable sticks with hummus. Greek yogurt. Fruit and nut trail mix. Eat balanced meals throughout the week. This includes: Fruit: 2-3 servings a day Vegetables: 4-5 servings a day Low-fat dairy: 2 servings a day Fish, poultry, or lean meat: 1 serving a day Beans and legumes: 2 or more servings a week Nuts and seeds: 1-2 servings a day Whole grains: 6-8 servings a day Extra-virgin olive oil: 3-4 servings a day Limit red meat and sweets to only a few servings a month What are my food choices? Mediterranean diet Recommended Grains: Whole-grain pasta. Brown rice. Bulgar wheat. Polenta. Couscous. Whole-wheat bread. [place] Madeira. Vegetables: Artichokes. Beets. Broccoli. Cabbage. Carrots. Eggplant. Green beans. Chard. Kale. Spinach. Onions. Leeks. Peas. Squash. Tomatoes. Peppers. Radishes. Fruits: Apples. Apricots. Avocado. Berries. Bananas. Cherries. Dates. Figs. Grapes. Lemons. Melon. Oranges. Peaches. Plums. Pomegranate. Meats and other protein foods: Beans. Almonds. Sunflower seeds. Pine nuts. Peanuts. Cod. Salmon. Scallops. Shrimp. Tuna. Tilapia. Clams. Oysters. Eggs. Dairy: Low-fat milk. Cheese. Greek yogurt. Beverages: Water. Red wine. Herbal tea. Fats and oils: Extra virgin olive oil. Avocado oil. Grape seed oil. Sweets and desserts: [place] yogurt with honey. Baked apples. Poached pears. Trail mix. Seasoning and  other foods: Basil. Cilantro. Coriander. Cumin. Mint. Parsley. Sage. Rosemary. Tarragon. Garlic. Oregano. Thyme. Pepper. Balsalmic vinegar. Tahini. Hummus. Tomato sauce. Olives. Mushrooms. Limit these Grains: Prepackaged pasta or rice dishes. Prepackaged cereal with added sugar. Vegetables: Deep fried potatoes (french fries). Fruits: Fruit canned in syrup. Meats and other protein foods: Beef. Pork. Lamb. Poultry with skin. Hot dogs. [place]. Dairy: Ice cream. Sour cream. Whole milk. Beverages: Juice. Sugar-sweetened soft drinks. Beer. Liquor and spirits. Fats and oils: Butter. Canola oil. Vegetable oil. Beef fat (tallow). Lard. Sweets and desserts: Cookies. Cakes. Pies. Candy. Seasoning and other foods: Mayonnaise. Premade sauces and marinades. The items listed may not be a complete list. Talk with your dietitian about what dietary choices are right for you. Summary The Mediterranean diet includes both food and lifestyle choices. Eat a variety of fresh fruits and vegetables, beans, nuts, seeds, and whole grains. Limit the amount of red meat and sweets that you eat. Talk with your health care provider about whether it is safe for you to drink red wine in moderation. This means 1 glass a day for nonpregnant women and 2 glasses a day for men. A glass of wine equals 5 oz (150 mL). This information is not intended to replace advice given to you by your health care provider. Make  sure you discuss any questions you have with your health care provider. Document Released: 09/04/2015 Document Revised: 10/07/2015 Document Reviewed: 09/04/2015 Elsevier Interactive Patient Education  2017 [employer].

## 2023-11-10 ENCOUNTER — Ambulatory Visit: Payer: Self-pay | Admitting: Physician Assistant

## 2023-11-10 ENCOUNTER — Telehealth: Payer: Self-pay

## 2023-11-10 NOTE — Telephone Encounter (Signed)
 Pt called and said he had an MRI Scheduled on Nov 1th. And would like a closer appointment because if he has to go on meds he would like to do it before Feb. I explained x3 that when the MRI comes back if he needs to go on meds she will start him on them before that. We will call you when MRI comes back with the results. All questions where answered

## 2023-11-17 ENCOUNTER — Telehealth: Payer: Self-pay

## 2023-11-17 NOTE — Telephone Encounter (Signed)
 Copied from CRM [id number]. Topic: Clinical - Request for Lab/Test Order >> Nov 17, 2023  2:34 PM [place] J wrote: Reason for CRM: The patient would like to know if he could have basic labs sent in prior to his physical on 12/15.

## 2023-11-18 NOTE — Telephone Encounter (Signed)
 Spoke with patient regarding Dr. Gib response. Patient states we have nothing to talk about at my physical. How will Dr. [place] know anything without bloodwork. Explained to patient, labs cannot be preemptively ordered due to insurance purposes and the possible need for other tests which would be determined during his physical. Patient states I have to starve all day. Advised patient we could reschedule his appt for an earlier time. Patient agrees. Appt rescheduled for same day at 0930. Patient voiced understanding.

## 2023-11-18 NOTE — Telephone Encounter (Signed)
 The problem with this is that his health insurance could deny coverage for basic labs. I prefer to discuss during visit what labs are necessary. Thanks, BJ

## 2023-11-18 NOTE — Telephone Encounter (Signed)
 Attempted to reach patient regarding Dr. Gib response. Left voicemail for patient to return my call.

## 2023-11-26 ENCOUNTER — Ambulatory Visit
Admission: RE | Admit: 2023-11-26 | Discharge: 2023-11-26 | Disposition: A | Discharge status: Home / Self Care | Source: Ambulatory Visit | Attending: Physician Assistant | Admitting: Physician Assistant

## 2023-11-26 DIAGNOSIS — H9190 Unspecified hearing loss, unspecified ear: Secondary | ICD-10-CM | POA: Diagnosis not present

## 2023-11-26 DIAGNOSIS — R251 Tremor, unspecified: Secondary | ICD-10-CM | POA: Diagnosis not present

## 2023-12-01 NOTE — Progress Notes (Signed)
 I left message to call the office to discuss MRI results.

## 2023-12-02 ENCOUNTER — Telehealth: Payer: Self-pay | Admitting: Neurology

## 2023-12-02 NOTE — Telephone Encounter (Signed)
 Tried to call voicemail again

## 2023-12-02 NOTE — Telephone Encounter (Signed)
 Patient really wants to be started on a medication, he say he isn't doing the neuropscy testing.To call back Monday. Thank you

## 2023-12-02 NOTE — Telephone Encounter (Signed)
 Pt is returning your call and he wants to discuss the MRI results  Thanks

## 2023-12-02 NOTE — Progress Notes (Signed)
 Voicemail again 10:43am 12/02/2023

## 2023-12-05 ENCOUNTER — Other Ambulatory Visit: Payer: Self-pay | Admitting: Physician Assistant

## 2023-12-05 MED ORDER — DONEPEZIL HCL 10 MG PO TABS: ORAL_TABLET | ORAL | 3 refills | Status: AC

## 2023-12-05 NOTE — Telephone Encounter (Signed)
 Voicemail not set up today 12/05/2023 at 10:38am

## 2023-12-05 NOTE — Telephone Encounter (Signed)
 I spoke with patient and he is wanting further information on Neuropschy testing. Will mail him some information. Doesn't want to do testing, advised he needed to per Camie Sevin, PA-C for clarity of the diagnosis.

## 2023-12-13 DIAGNOSIS — H40053 Ocular hypertension, bilateral: Secondary | ICD-10-CM | POA: Diagnosis not present

## 2023-12-13 DIAGNOSIS — Z961 Presence of intraocular lens: Secondary | ICD-10-CM | POA: Diagnosis not present

## 2023-12-14 ENCOUNTER — Telehealth: Payer: Self-pay | Admitting: Physician Assistant

## 2023-12-14 NOTE — Telephone Encounter (Signed)
 Pt. Would like call back to discuss MRI results and Rx dosage and diagnosis

## 2023-12-26 ENCOUNTER — Telehealth: Payer: Self-pay | Admitting: Physician Assistant

## 2023-12-26 NOTE — Telephone Encounter (Signed)
 Pt . Would like to speak to clinical staff anytime as close to 3pm as possible about dosage of Rx

## 2023-12-26 NOTE — Telephone Encounter (Signed)
 Tried to call voicemail not set up, if he calls back, ask specificially what he needs? Will call back

## 2023-12-26 NOTE — Telephone Encounter (Signed)
 Sent to [employer]. For information

## 2023-12-26 NOTE — Telephone Encounter (Signed)
 Pt called in this afternoon and he stated that he is having issues with the all timer medication that he was prescribe. Pt wants to know can he stop the medication and if so what would be the next step. Thanks

## 2023-12-27 NOTE — Telephone Encounter (Signed)
 Had diarrhea, much better will continue meds since he is better. Will update office back in a few weeks, he htanked me for calling.

## 2023-12-28 NOTE — Progress Notes (Signed)
 Close encounter

## 2024-01-09 ENCOUNTER — Encounter: Admitting: Family Medicine

## 2024-01-09 ENCOUNTER — Encounter: Payer: Self-pay | Admitting: Family Medicine

## 2024-01-09 ENCOUNTER — Ambulatory Visit: Admitting: Family Medicine

## 2024-01-09 VITALS — BP 140/60 | HR 63 | Temp 98.0°F | Ht 73.0 in | Wt 190.0 lb

## 2024-01-09 DIAGNOSIS — I2510 Atherosclerotic heart disease of native coronary artery without angina pectoris: Secondary | ICD-10-CM

## 2024-01-09 DIAGNOSIS — N401 Enlarged prostate with lower urinary tract symptoms: Secondary | ICD-10-CM

## 2024-01-09 DIAGNOSIS — R2681 Unsteadiness on feet: Secondary | ICD-10-CM

## 2024-01-09 DIAGNOSIS — R351 Nocturia: Secondary | ICD-10-CM | POA: Diagnosis not present

## 2024-01-09 DIAGNOSIS — R7303 Prediabetes: Secondary | ICD-10-CM | POA: Diagnosis not present

## 2024-01-09 DIAGNOSIS — E785 Hyperlipidemia, unspecified: Secondary | ICD-10-CM | POA: Diagnosis not present

## 2024-01-09 DIAGNOSIS — M5416 Radiculopathy, lumbar region: Secondary | ICD-10-CM

## 2024-01-09 DIAGNOSIS — Z0000 Encounter for general adult medical examination without abnormal findings: Secondary | ICD-10-CM

## 2024-01-09 DIAGNOSIS — G250 Essential tremor: Secondary | ICD-10-CM

## 2024-01-09 LAB — LIPID PANEL
Cholesterol: 105 mg/dL (ref 0–200)
HDL: 58.8 mg/dL (ref >39.00)
LDL Cholesterol: 36 mg/dL (ref 0–99)
NonHDL: 46.58
Total CHOL/HDL Ratio: 2
Triglycerides: 51 mg/dL (ref 0.0–149.0)
VLDL: 10.2 mg/dL (ref 0.0–40.0)

## 2024-01-09 LAB — HEPATIC FUNCTION PANEL
ALT: 32 U/L (ref 0–53)
AST: 35 U/L (ref 0–37)
Albumin: 4.3 g/dL (ref 3.5–5.2)
Alkaline Phosphatase: 59 U/L (ref 39–117)
Bilirubin, Direct: 0.2 mg/dL (ref 0.0–0.3)
Total Bilirubin: 1.2 mg/dL (ref 0.2–1.2)
Total Protein: 7.1 g/dL (ref 6.0–8.3)

## 2024-01-09 LAB — BASIC METABOLIC PANEL WITH GFR
BUN: 19 mg/dL (ref 6–23)
CO2: 29 meq/L (ref 19–32)
Calcium: 9.5 mg/dL (ref 8.4–10.5)
Chloride: 103 meq/L (ref 96–112)
Creatinine, Ser: 1.15 mg/dL (ref 0.40–1.50)
GFR: 58.03 mL/min — ABNORMAL LOW (ref >60.00)
Glucose, Bld: 107 mg/dL — ABNORMAL HIGH (ref 70–99)
Potassium: 4.2 meq/L (ref 3.5–5.1)
Sodium: 138 meq/L (ref 135–145)

## 2024-01-09 LAB — HEMOGLOBIN A1C: Hgb A1c MFr Bld: 6.1 % (ref 4.6–6.5)

## 2024-01-09 NOTE — Patient Instructions (Addendum)
 A few things to remember from today's visit:  Routine general medical examination at a health care facility  Dyslipidemia - Plan: Hepatic function panel, Lipid panel  Prediabetes - Plan: Basic metabolic panel with GFR, Hemoglobin A1c  Lumbar radiculopathy  BPH associated with nocturia  Unstable gait  Essential tremor  No changes today.  If you need refills for medications you take chronically, please call your pharmacy. Do not use My Chart to request refills or for acute issues that need immediate attention. If you send a my chart message, it may take a few days to be addressed, specially if I am not in the office.  Please be sure medication list is accurate. If a new problem present, please set up appointment sooner than planned today.

## 2024-01-09 NOTE — Progress Notes (Unsigned)
 Chief Complaint  Patient presents with   Annual Exam    physical   Follow-up    Concerns about urinary frequency and balance   Discussed the use of AI scribe software for clinical note transcription with the patient, who gave verbal consent to proceed.  History of Present Illness Collin [name] is an 83 year old male with a PMHx significant for hearing loss, STEMI, CAD ,s/p angioplasty, unstable gait, foot drop, peripheral neuropathy, OA, BPH, lumbar radiculopathy s/p, lumbar laminectomy, and dyslipidemia, who is here today for his routine physical examination.  Last CPE: 01/07/23. Since his last visit he has been following with neurologist, started on Aricept  10 mg daily for memory impairment. He has also seen his cardiologist, Dr. Wonda; and dermatologist. Today he has several concerns, no new problems.  -BPH: He experiences significant urinary frequency and urgency, which he describes as a 'real problem' affecting his daily life. Increased fluid intake exacerbates these symptoms, leading to urgency that nearly results in incontinence. He is currently taking Flomax  0.4 mg daily. Not longer on finasteride . He is concerned about the impact of fluid intake on his urinary symptoms, he is asking about the minimal amount of fluids he should drink to prevent urinary symptoms. Negative for gross hematuria, foamy urine, or pelvic/rectal pain  -He has a history of balance issues, attributed to a 'dropped foot' and chronic back pain.  He also has history of peripheral neuropathy. Prolonged standing exacerbates his lower back pain and leads to tiredness, though he can walk a mile and a half on a track with no claudication-like symptoms. He is cautious about his balance despite not experiencing falls.  He is not using a walker or a cane. No falls since his last visit. He completed PT. He engages in exercises to strengthen his legs and uses an elliptical rider, which he finds manageable because  he can hold onto something.  -He mentions bilateral hand tremor, exacerbated by activities like eating or drinking from a cup. It is worse in his right hand, affecting his ability to eat as his 'food rolls on my fork.' This tremor has been present for years and is known to his neurologist.  -Lumbar radiculopathy, underwent back surgery 5-6 years ago, residual 'dropped foot.' He is interested in exploring alternative treatments for his back pain and balance. He has seen Dr Cloretta once and felt better after therapy. He wonders if he should pursue further therapy. ***  Immunization History  Administered Date(s) Administered   Fluad Quad(high Dose 65+) 11/05/2019, 10/26/2021   Fluad Trivalent(High Dose 65+) 11/09/2022   INFLUENZA, HIGH DOSE SEASONAL PF 10/16/2015, 11/17/2016, 11/02/2017, 11/08/2023   Influenza Whole 12/04/2008, 01/20/2010, 07/26/2010   Influenza,inj,Quad PF,6+ Mos 11/20/2012, 11/20/2013, 10/08/2014   Influenza-Unspecified 11/17/2016, 10/17/2020, 10/25/2021   PFIZER Comirnaty(Gray Top)Covid-19 Tri-Sucrose Vaccine 10/17/2020   PFIZER(Purple Top)SARS-COV-2 Vaccination 03/02/2019, 03/27/2019, 10/30/2019, 10/25/2021, 10/28/2021   Pfizer Covid-19 Vaccine Bivalent Booster 60yrs & up 10/17/2020   Pfizer(Comirnaty)Fall Seasonal Vaccine 12 years and older 10/26/2021, 11/09/2022, 11/08/2023   Pneumococcal Conjugate-13 11/20/2013   Pneumococcal Polysaccharide-23 07/20/2006   Td 07/20/2006   Zoster Recombinant(Shingrix) 03/27/2018, 07/14/2018   Zoster, Live 09/09/2010    Health Maintenance  Topic Date Due   COVID-19 Vaccine (8 - 2025-26 season) 05/08/2024   Medicare Annual Wellness (AWV)  06/13/2024   Pneumococcal Vaccine: 50+ Years  Completed   Influenza Vaccine  Completed   Zoster Vaccines- Shingrix  Completed   Meningococcal B Vaccine  Aged Out   DTaP/Tdap/Td  Discontinued   Colonoscopy  Discontinued   CAD: Not longer on aspirin  81 mg. He is on metoprolol  succinate 25 mg  daily and Plavix  75 mg daily.   Lab Results  Component Value Date   WBC 6.5 01/07/2023   HGB 14.8 01/07/2023   HCT 45.1 01/07/2023   MCV 85.8 01/07/2023   PLT 208.0 01/07/2023   Hyperlipidemia: Currently on atorvastatin  80 mg daily.  Lab Results  Component Value Date   CHOL 117 01/07/2023   HDL 61.20 01/07/2023   LDLCALC 43 01/07/2023   TRIG 62.0 01/07/2023   CHOLHDL 2 01/07/2023   He is hemoglobin A1c has been elevated, no history of diabetes. Lab Results  Component Value Date   HGBA1C 6.4 01/07/2023    Review of Systems  Constitutional:  Positive for fatigue. Negative for activity change, appetite change and fever.  HENT:  Positive for rhinorrhea. Negative for facial swelling, mouth sores, sore throat and trouble swallowing.   Eyes:  Negative for redness and visual disturbance.  Respiratory:  Negative for cough, shortness of breath and wheezing.   Cardiovascular:  Negative for chest pain, palpitations and leg swelling.  Gastrointestinal:  Negative for abdominal pain, nausea and vomiting.  Endocrine: Negative for cold intolerance, heat intolerance, polydipsia, polyphagia and polyuria.  Genitourinary:  Negative for decreased urine volume, dysuria, hematuria and testicular pain.  Musculoskeletal:  Positive for arthralgias, back pain and gait problem.  Skin:  Negative for color change and rash.  Allergic/Immunologic: Positive for environmental allergies.  Neurological:  Positive for tremors. Negative for syncope, weakness and headaches.  Hematological:  Negative for adenopathy. Does not bruise/bleed easily.  Psychiatric/Behavioral:  Negative for confusion and hallucinations.    Current Outpatient Medications on File Prior to Visit  Medication Sig Dispense Refill   ascorbic acid  (VITAMIN C ) 500 MG tablet Take 0.5 tablets (250 mg total) by mouth daily. 100 tablet 0   atorvastatin  (LIPITOR ) 80 MG tablet Take 1 tablet (80 mg total) by mouth daily. 90 tablet 3   Cholecalciferol  (VITAMIN D-3) 125 MCG (5000 UT) TABS Take 125 mcg by mouth daily.     clopidogrel  (PLAVIX ) 75 MG tablet TAKE 1 TABLET(75 MG) BY MOUTH DAILY 90 tablet 3   Coenzyme Q10 (COQ10 PO) Take 300 mg by mouth daily.     donepezil  (ARICEPT ) 10 MG tablet Take half tablet (5 mg) daily for 2 weeks, then increase to the full tablet at 10 mg daily 90 tablet 3   Glucosamine-Chondroitin 500-400 MG CAPS Take 3 tablets by mouth daily.     metoprolol  succinate (TOPROL -XL) 25 MG 24 hr tablet Take 1 tablet (25 mg total) by mouth daily. 90 tablet 3   Multiple Vitamins-Minerals (MENS 50+ MULTIVITAMIN) TABS Take 1 tablet by mouth daily.     Omega-3 Fatty Acids (FISH OIL PO) Take 1,400 mg by mouth daily.     Probiotic Product (PROBIOTIC PO) Take 1 tablet by mouth daily.     tamsulosin  (FLOMAX ) 0.4 MG CAPS capsule TAKE 1 CAPSULE(0.4 MG) BY MOUTH DAILY 90 capsule 2   aspirin  81 MG tablet Take 1 tablet (81 mg total) by mouth at bedtime. (Patient not taking: Reported on 01/09/2024) 30 tablet    ipratropium (ATROVENT ) 0.06 % nasal spray Place 2 sprays into both nostrils 2 (two) times daily as needed for rhinitis. (Patient not taking: Reported on 01/09/2024) 15 mL 5   No current facility-administered medications on file prior to visit.   Past Medical History:  Diagnosis Date  Allergy    Arthritis    BPH (benign prostatic hyperplasia)    CAD (coronary artery disease)    Cataract    Diverticulosis of colon    Hx of adenomatous colonic polyps 05/22/2014   Hyperlipidemia    Hypertension    MYOCARDIAL INFARCTION, HX OF 07/20/2006   Qualifier: Diagnosis of  By: Tammie MD, Bruce      Past Surgical History:  Procedure Laterality Date   CATARACT EXTRACTION Bilateral    Bil   COLONOSCOPY     CORONARY ARTERY BYPASS GRAFT N/A 09/01/2022   Procedure: CORONARY ARTERY BYPASS GRAFTING (CABG) times three using left internal mammary artery and right saphenous vein.;  Surgeon: Maryjane Mt, MD;  Location: [facility] OR;  Service: Open Heart  Surgery;  Laterality: N/A;   CORONARY/GRAFT ACUTE MI REVASCULARIZATION N/A 09/01/2022   Procedure: Coronary/Graft Acute MI Revascularization;  Surgeon: Dann Candyce RAMAN, MD;  Location: [facility] INVASIVE CV LAB;  Service: Cardiovascular;  Laterality: N/A;   IABP INSERTION N/A 09/01/2022   Procedure: IABP Insertion;  Surgeon: Dann Candyce RAMAN, MD;  Location: [facility] INVASIVE CV LAB;  Service: Cardiovascular;  Laterality: N/A;   LEFT HEART CATH AND CORONARY ANGIOGRAPHY N/A 09/01/2022   Procedure: LEFT HEART CATH AND CORONARY ANGIOGRAPHY;  Surgeon: Dann Candyce RAMAN, MD;  Location: [facility] INVASIVE CV LAB;  Service: Cardiovascular;  Laterality: N/A;   LUMBAR LAMINECTOMY/DECOMPRESSION MICRODISCECTOMY Left 01/20/2018   Procedure: Left Lumbar five Sacral one extraforaminal decompression;  Surgeon: Joshua Alm RAMAN, MD;  Location: [facility] OR;  Service: Neurosurgery;  Laterality: Left;   PTCA     TEE WITHOUT CARDIOVERSION N/A 09/01/2022   Procedure: TRANSESOPHAGEAL ECHOCARDIOGRAM;  Surgeon: Maryjane Mt, MD;  Location: [facility] OR;  Service: Open Heart Surgery;  Laterality: N/A;    Allergies  Allergen Reactions   Penicillins Rash    Immediate rash    Family History  Problem Relation Age of Onset   Heart disease Mother        CABG   Dementia Mother 59   Stroke Father    Heart disease Father    Kidney disease Father        renal failure   AAA (abdominal aortic aneurysm) Father    Hypertension Sister    Diabetes Brother    AAA (abdominal aortic aneurysm) Paternal Uncle    Breast cancer Daughter    Colon cancer Neg Hx     Social History   Socioeconomic History   Marital status: Married    Spouse name: Not on file   Number of children: 3   Years of education: 16   Highest education level: Not on file  Occupational History   Occupation: retired  Tobacco Use   Smoking status: Never   Smokeless tobacco: Never  Vaping Use   Vaping status: Never Used  Substance and Sexual Activity   Alcohol use: Yes     Comment: wine once a week and beer once a week   Drug use: No   Sexual activity: Not on file  Other Topics Concern   Not on file  Social History Narrative   Lives with wife in a 2 story home.  Has 3 children.     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    Education: college.    Occasional beer, no smoking, no drugs   Social Drivers of Health   Tobacco Use: Low Risk (01/09/2024)   Patient History    Smoking Tobacco Use: Never    Smokeless Tobacco Use:  Never    Passive Exposure: Not on file  Financial Resource Strain: Low Risk (10/16/2021)   Overall Financial Resource Strain (CARDIA)    Difficulty of Paying Living Expenses: Not hard at all  Food Insecurity: No Food Insecurity (09/28/2022)   Hunger Vital Sign    Worried About Running Out of Food in the Last Year: Never true    Ran Out of Food in the Last Year: Never true  Transportation Needs: No Transportation Needs (09/28/2022)   PRAPARE - Administrator, Civil Service (Medical): No    Lack of Transportation (Non-Medical): No  Physical Activity: Sufficiently Active (03/03/2021)   Exercise Vital Sign    Days of Exercise per Week: 6 days    Minutes of Exercise per Session: 90 min  Stress: No Stress Concern Present (03/03/2021)   [employer] of Occupational Health - Occupational Stress Questionnaire    Feeling of Stress : Not at all  Social Connections: Not on file  Depression (PHQ2-9): Low Risk (06/14/2023)   Depression (PHQ2-9)    PHQ-2 Score: 0  Alcohol Screen: Not on file  Housing: Low Risk (09/03/2022)   Housing    Last Housing Risk Score: 0  Utilities: Not At Risk (09/03/2022)   AHC Utilities    Threatened with loss of utilities: No  Health Literacy: Not on file   Today's Vitals   01/09/24 0925  BP: (!) 140/60  Pulse: 63  Temp: 98 F (36.7 C)  SpO2: 97%  Weight: 190 lb (86.2 kg)  Height: 6' 1 (1.854 m)   Body mass index is 25.07 kg/m. Wt Readings from Last 3 Encounters:  01/09/24  190 lb (86.2 kg)  11/09/23 195 lb (88.5 kg)  10/26/23 190 lb (86.2 kg)   Physical Exam Vitals and nursing note reviewed.  Constitutional:      General: He is not in acute distress.    Appearance: He is well-developed.  HENT:     Head: Normocephalic and atraumatic.     Right Ear: External ear normal.     Left Ear: External ear normal.     Ears:     Comments: Hearing aids in place.    Mouth/Throat:     Mouth: Mucous membranes are moist.     Pharynx: Oropharynx is clear.  Eyes:     Conjunctiva/sclera: Conjunctivae normal.     Pupils: Pupils are equal, round, and reactive to light.  Neck:     Thyroid : No thyroid  mass or thyromegaly.  Cardiovascular:     Rate and Rhythm: Normal rate and regular rhythm.     Heart sounds: No murmur heard.    Comments: DP pulses palpable.  Trace bilateral pitting edema.  Pulmonary:     Effort: Pulmonary effort is normal. No respiratory distress.     Breath sounds: Normal breath sounds.  Abdominal:     Palpations: Abdomen is soft. There is no hepatomegaly or mass.     Tenderness: There is no abdominal tenderness.  Genitourinary:    Comments: No concerns. Musculoskeletal:        General: No tenderness.     Cervical back: Normal range of motion.     Comments: No signs of synovitis.  Lymphadenopathy:     Cervical: No cervical adenopathy.  Skin:    General: Skin is warm.     Findings: No erythema or rash.  Neurological:     General: No focal deficit present.     Mental Status: He is alert and oriented to  person, place, and time.     Cranial Nerves: No cranial nerve deficit.     Gait: Gait abnormal.     Deep Tendon Reflexes:     Reflex Scores:      Bicep reflexes are 2+ on the right side and 2+ on the left side.      Patellar reflexes are 2+ on the right side and 2+ on the left side.    Comments: Unstable gait, not assisted. Foot drop,left.  Psychiatric:        Mood and Affect: Mood and affect normal.   ASSESSMENT AND PLAN:  Mr. Delman  was seen today for his routine general medical examination.  Orders Placed This Encounter  Procedures   Basic metabolic panel with GFR   Hepatic function panel   Hemoglobin A1c   Lipid panel   Lab Results  Component Value Date   HGBA1C 6.1 01/09/2024   Lab Results  Component Value Date   CHOL 105 01/09/2024   HDL 58.80 01/09/2024   LDLCALC 36 01/09/2024   TRIG 51.0 01/09/2024   CHOLHDL 2 01/09/2024   Lab Results  Component Value Date   NA 138 01/09/2024   CL 103 01/09/2024   K 4.2 01/09/2024   CO2 29 01/09/2024   BUN 19 01/09/2024   CREATININE 1.15 01/09/2024   GFR 58.03 (L) 01/09/2024   CALCIUM  9.5 01/09/2024   ALBUMIN  4.3 01/09/2024   GLUCOSE 107 (H) 01/09/2024   Lab Results  Component Value Date   ALT 32 01/09/2024   AST 35 01/09/2024   ALKPHOS 59 01/09/2024   BILITOT 1.2 01/09/2024   Routine general medical examination at a health care facility Assessment & Plan: We discussed the importance of regular physical activity and healthy diet for prevention of chronic illness and/or complications. Preventive guidelines reviewed. Vaccination up to date. Next CPE in a year.   Dyslipidemia Assessment & Plan: Continue atorvastatin  80 mg daily and low-fat diet. Further recommendation will be given according to lipid panel result.  Orders: -     Hepatic function panel; Future -     Lipid panel; Future  Prediabetes Assessment & Plan: Last hemoglobin A1c 6.4 in 12/2022. Encouraged consistency with a healthy lifestyle for diabetes prevention. Further recommendation will be given according to hemoglobin A1c result.  Orders: -     Basic metabolic panel with GFR; Future -     Hemoglobin A1c; Future  Lumbar radiculopathy Assessment & Plan: Some of the symptoms he is reporting, lower extremity tiredness and abnormal balance could be related with this problem. S/p lumbar laminectomy (01/20/18) with residual LE numbness. Continue fall precautions.   BPH  associated with nocturia Assessment & Plan: Problem is not well-controlled. He has seen urologist and currently on Flomax  0.4 mg daily. He could arrange follow-up with urologist, he would like to hold on this for now. He denies interested in prostate cancer screening at this point.   Unstable gait Assessment & Plan: Fall precaution discussed. He does not f think he needs a walker. Some of his chronic co morbilities can be contributing factors.   Atherosclerosis of native coronary artery of native heart without angina pectoris Assessment & Plan: He is no longer on Aspirin . Currently on metoprolol  succinate 25 mg daily and atorvastatin  80 mg daily. SBP mildly elevated today. Monitor BP at home. Following with cardiologist.    He is status post microdiscectomy left L5-S1 about 6 years ago.  Last visit with neurosurgeon in 02/2022.***  Hildreth Orsak G. Lorilee Cafarella,  MD  [facility]. [place] office.

## 2024-01-10 NOTE — Assessment & Plan Note (Signed)
 Problem is not well-controlled. He has seen urologist and currently on Flomax  0.4 mg daily. He could arrange follow-up with urologist, he would like to hold on this for now. He denies interested in prostate cancer screening at this point.

## 2024-01-10 NOTE — Assessment & Plan Note (Signed)
 He is no longer on Aspirin . Currently on metoprolol  succinate 25 mg daily and atorvastatin  80 mg daily. SBP mildly elevated today. Monitor BP at home. Following with cardiologist.

## 2024-01-10 NOTE — Assessment & Plan Note (Signed)
We discussed the importance of regular physical activity and healthy diet for prevention of chronic illness and/or complications. Preventive guidelines reviewed. Vaccination up to date. Next CPE in a year. 

## 2024-01-10 NOTE — Assessment & Plan Note (Signed)
 Last hemoglobin A1c 6.4 in 12/2022. Encouraged consistency with a healthy lifestyle for diabetes prevention. Further recommendation will be given according to hemoglobin A1c result.

## 2024-01-10 NOTE — Assessment & Plan Note (Signed)
 Some of the symptoms he is reporting, lower extremity tiredness and abnormal balance could be related with this problem. S/p lumbar laminectomy (01/20/18) with residual LE numbness. Continue fall precautions.

## 2024-01-10 NOTE — Assessment & Plan Note (Signed)
 Continue atorvastatin 80 mg daily and low fat diet. Further recommendation will be given according to lipid panel result.

## 2024-01-10 NOTE — Assessment & Plan Note (Signed)
 Fall precaution discussed. He does not f think he needs a walker. Some of his chronic co morbilities can be contributing factors.

## 2024-01-12 ENCOUNTER — Ambulatory Visit: Payer: Self-pay | Admitting: Family Medicine

## 2024-01-12 DIAGNOSIS — N1831 Chronic kidney disease, stage 3a: Secondary | ICD-10-CM

## 2024-01-13 NOTE — Telephone Encounter (Signed)
 Patient stated he is not available and he would call me back.

## 2024-01-13 NOTE — Telephone Encounter (Signed)
 Patient was informed of his labs results. Patient voiced understanding. Patient has been scheduled for his 6 months and lab appointment.

## 2024-01-13 NOTE — Telephone Encounter (Signed)
 LVMTRC

## 2024-01-31 ENCOUNTER — Other Ambulatory Visit: Payer: Self-pay

## 2024-01-31 ENCOUNTER — Telehealth: Payer: Self-pay | Admitting: Family Medicine

## 2024-01-31 DIAGNOSIS — E785 Hyperlipidemia, unspecified: Secondary | ICD-10-CM

## 2024-01-31 DIAGNOSIS — R7303 Prediabetes: Secondary | ICD-10-CM

## 2024-01-31 DIAGNOSIS — Z0000 Encounter for general adult medical examination without abnormal findings: Secondary | ICD-10-CM

## 2024-01-31 NOTE — Telephone Encounter (Signed)
 Patient rescheduled his appointment on 07/17/24 due to provider being out of office.  Pt states he would like to do any labs Dr. Jordan wants 1 week before his appt so he will have something to discuss with her.  Pt is requesting a call back to inform him of what he needs to do and so he will know if he can have this done.

## 2024-01-31 NOTE — Telephone Encounter (Signed)
Lab appointment made.

## 2024-02-29 ENCOUNTER — Institutional Professional Consult (permissible substitution): Admitting: Psychology

## 2024-02-29 ENCOUNTER — Ambulatory Visit: Payer: Self-pay

## 2024-03-05 ENCOUNTER — Ambulatory Visit: Payer: Self-pay

## 2024-03-05 ENCOUNTER — Institutional Professional Consult (permissible substitution): Admitting: Psychology

## 2024-03-07 ENCOUNTER — Encounter: Admitting: Psychology

## 2024-03-12 ENCOUNTER — Encounter: Admitting: Psychology

## 2024-03-19 ENCOUNTER — Ambulatory Visit: Admitting: Physician Assistant

## 2024-03-28 ENCOUNTER — Ambulatory Visit

## 2024-03-28 ENCOUNTER — Institutional Professional Consult (permissible substitution): Admitting: Psychology

## 2024-04-04 ENCOUNTER — Encounter: Admitting: Psychology

## 2024-04-05 ENCOUNTER — Ambulatory Visit: Admitting: Physician Assistant

## 2024-07-10 ENCOUNTER — Other Ambulatory Visit

## 2024-07-17 ENCOUNTER — Ambulatory Visit: Admitting: Family Medicine

## 2024-07-23 ENCOUNTER — Other Ambulatory Visit

## 2024-07-25 ENCOUNTER — Other Ambulatory Visit

## 2024-07-30 ENCOUNTER — Ambulatory Visit: Admitting: Family Medicine

## 2024-09-24 ENCOUNTER — Ambulatory Visit: Admitting: Family Medicine
